# Patient Record
Sex: Male | Born: 1940 | Hispanic: No | Marital: Married | Smoking: Former smoker
Health system: Southern US, Community
[De-identification: ages and names within clinical notes are randomized; demographics above are authoritative.]

## PROBLEM LIST (undated history)

## (undated) DIAGNOSIS — E785 Hyperlipidemia, unspecified: Secondary | ICD-10-CM

## (undated) DIAGNOSIS — K5790 Diverticulosis of intestine, part unspecified, without perforation or abscess without bleeding: Secondary | ICD-10-CM

## (undated) DIAGNOSIS — I2510 Atherosclerotic heart disease of native coronary artery without angina pectoris: Secondary | ICD-10-CM

## (undated) DIAGNOSIS — R569 Unspecified convulsions: Secondary | ICD-10-CM

## (undated) DIAGNOSIS — E119 Type 2 diabetes mellitus without complications: Secondary | ICD-10-CM

## (undated) DIAGNOSIS — J189 Pneumonia, unspecified organism: Secondary | ICD-10-CM

## (undated) DIAGNOSIS — I209 Angina pectoris, unspecified: Secondary | ICD-10-CM

## (undated) DIAGNOSIS — I739 Peripheral vascular disease, unspecified: Secondary | ICD-10-CM

## (undated) DIAGNOSIS — K219 Gastro-esophageal reflux disease without esophagitis: Secondary | ICD-10-CM

## (undated) DIAGNOSIS — I10 Essential (primary) hypertension: Secondary | ICD-10-CM

## (undated) DIAGNOSIS — G4730 Sleep apnea, unspecified: Secondary | ICD-10-CM

## (undated) DIAGNOSIS — N400 Enlarged prostate without lower urinary tract symptoms: Secondary | ICD-10-CM

## (undated) DIAGNOSIS — I872 Venous insufficiency (chronic) (peripheral): Secondary | ICD-10-CM

## (undated) DIAGNOSIS — G4700 Insomnia, unspecified: Secondary | ICD-10-CM

## (undated) DIAGNOSIS — Z22322 Carrier or suspected carrier of Methicillin resistant Staphylococcus aureus: Secondary | ICD-10-CM

## (undated) DIAGNOSIS — IMO0002 Reserved for concepts with insufficient information to code with codable children: Secondary | ICD-10-CM

## (undated) DIAGNOSIS — F32A Depression, unspecified: Secondary | ICD-10-CM

## (undated) DIAGNOSIS — I639 Cerebral infarction, unspecified: Secondary | ICD-10-CM

## (undated) DIAGNOSIS — F329 Major depressive disorder, single episode, unspecified: Secondary | ICD-10-CM

## (undated) DIAGNOSIS — R519 Headache, unspecified: Secondary | ICD-10-CM

## (undated) DIAGNOSIS — R51 Headache: Secondary | ICD-10-CM

## (undated) HISTORY — DX: Venous insufficiency (chronic) (peripheral): I87.2

## (undated) HISTORY — PX: APPENDECTOMY: SHX54

## (undated) HISTORY — DX: Morbid (severe) obesity due to excess calories: E66.01

## (undated) HISTORY — PX: EYE SURGERY: SHX253

## (undated) HISTORY — DX: Benign prostatic hyperplasia without lower urinary tract symptoms: N40.0

## (undated) HISTORY — DX: Diverticulosis of intestine, part unspecified, without perforation or abscess without bleeding: K57.90

## (undated) HISTORY — PX: PERIPHERAL ARTERIAL STENT GRAFT: SHX2220

## (undated) HISTORY — DX: Depression, unspecified: F32.A

## (undated) HISTORY — PX: CHOLECYSTECTOMY: SHX55

## (undated) HISTORY — DX: Insomnia, unspecified: G47.00

## (undated) HISTORY — DX: Carrier or suspected carrier of methicillin resistant Staphylococcus aureus: Z22.322

## (undated) HISTORY — PX: CORONARY ARTERY BYPASS GRAFT: SHX141

## (undated) HISTORY — DX: Sleep apnea, unspecified: G47.30

## (undated) HISTORY — PX: LUMBAR LAMINECTOMY: SHX95

## (undated) HISTORY — PX: BACK SURGERY: SHX140

## (undated) HISTORY — PX: MASTOID DEBRIDEMENT: SHX2002

## (undated) HISTORY — DX: Peripheral vascular disease, unspecified: I73.9

## (undated) HISTORY — DX: Hyperlipidemia, unspecified: E78.5

## (undated) HISTORY — DX: Reserved for concepts with insufficient information to code with codable children: IMO0002

## (undated) HISTORY — DX: Major depressive disorder, single episode, unspecified: F32.9

---

## 1977-08-05 HISTORY — PX: OTHER SURGICAL HISTORY: SHX169

## 1998-03-23 ENCOUNTER — Encounter: Admission: RE | Admit: 1998-03-23 | Discharge: 1998-06-21 | Payer: Self-pay | Admitting: Internal Medicine

## 1998-04-25 ENCOUNTER — Encounter: Admission: RE | Admit: 1998-04-25 | Discharge: 1998-07-24 | Payer: Self-pay | Admitting: Internal Medicine

## 1998-10-16 ENCOUNTER — Encounter: Admission: RE | Admit: 1998-10-16 | Discharge: 1998-10-19 | Payer: Self-pay | Admitting: Orthopedic Surgery

## 1999-01-16 ENCOUNTER — Encounter: Admission: RE | Admit: 1999-01-16 | Discharge: 1999-01-17 | Payer: Self-pay | Admitting: Internal Medicine

## 1999-02-01 ENCOUNTER — Ambulatory Visit (HOSPITAL_COMMUNITY): Admission: RE | Admit: 1999-02-01 | Discharge: 1999-02-01 | Payer: Self-pay | Admitting: Gastroenterology

## 1999-02-16 ENCOUNTER — Encounter: Payer: Self-pay | Admitting: Urology

## 1999-02-19 ENCOUNTER — Ambulatory Visit (HOSPITAL_COMMUNITY): Admission: RE | Admit: 1999-02-19 | Discharge: 1999-02-19 | Payer: Self-pay | Admitting: Urology

## 1999-02-19 ENCOUNTER — Encounter (INDEPENDENT_AMBULATORY_CARE_PROVIDER_SITE_OTHER): Payer: Self-pay | Admitting: Specialist

## 1999-04-16 ENCOUNTER — Encounter: Admission: RE | Admit: 1999-04-16 | Discharge: 1999-07-15 | Payer: Self-pay | Admitting: Orthopedic Surgery

## 1999-07-05 ENCOUNTER — Other Ambulatory Visit: Admission: RE | Admit: 1999-07-05 | Discharge: 1999-07-05 | Payer: Self-pay | Admitting: Orthopaedic Surgery

## 1999-08-27 ENCOUNTER — Encounter (INDEPENDENT_AMBULATORY_CARE_PROVIDER_SITE_OTHER): Payer: Self-pay | Admitting: *Deleted

## 1999-08-27 ENCOUNTER — Encounter: Payer: Self-pay | Admitting: Emergency Medicine

## 1999-08-27 ENCOUNTER — Encounter: Payer: Self-pay | Admitting: Internal Medicine

## 1999-08-27 ENCOUNTER — Inpatient Hospital Stay (HOSPITAL_COMMUNITY): Admission: EM | Admit: 1999-08-27 | Discharge: 1999-09-13 | Payer: Self-pay | Admitting: Emergency Medicine

## 1999-08-29 ENCOUNTER — Encounter: Payer: Self-pay | Admitting: Internal Medicine

## 1999-08-30 ENCOUNTER — Encounter: Payer: Self-pay | Admitting: Internal Medicine

## 1999-08-31 ENCOUNTER — Encounter: Payer: Self-pay | Admitting: Internal Medicine

## 1999-09-01 ENCOUNTER — Encounter: Payer: Self-pay | Admitting: Internal Medicine

## 1999-09-02 ENCOUNTER — Encounter: Payer: Self-pay | Admitting: Cardiothoracic Surgery

## 1999-09-02 ENCOUNTER — Encounter: Payer: Self-pay | Admitting: Internal Medicine

## 1999-09-03 ENCOUNTER — Encounter: Payer: Self-pay | Admitting: Internal Medicine

## 1999-09-03 ENCOUNTER — Encounter: Payer: Self-pay | Admitting: Cardiothoracic Surgery

## 1999-09-04 ENCOUNTER — Encounter: Payer: Self-pay | Admitting: Internal Medicine

## 1999-09-05 ENCOUNTER — Encounter: Payer: Self-pay | Admitting: Internal Medicine

## 1999-09-06 ENCOUNTER — Encounter: Payer: Self-pay | Admitting: Internal Medicine

## 1999-09-07 ENCOUNTER — Encounter: Payer: Self-pay | Admitting: Internal Medicine

## 1999-09-09 ENCOUNTER — Encounter: Payer: Self-pay | Admitting: Internal Medicine

## 1999-09-10 ENCOUNTER — Encounter: Payer: Self-pay | Admitting: Cardiothoracic Surgery

## 1999-09-11 ENCOUNTER — Encounter: Payer: Self-pay | Admitting: Internal Medicine

## 1999-09-20 ENCOUNTER — Encounter: Admission: RE | Admit: 1999-09-20 | Discharge: 1999-09-20 | Payer: Self-pay | Admitting: Cardiothoracic Surgery

## 1999-09-20 ENCOUNTER — Encounter: Payer: Self-pay | Admitting: Cardiothoracic Surgery

## 1999-10-01 ENCOUNTER — Encounter: Admission: RE | Admit: 1999-10-01 | Discharge: 1999-12-30 | Payer: Self-pay | Admitting: Orthopedic Surgery

## 1999-12-25 ENCOUNTER — Ambulatory Visit (HOSPITAL_COMMUNITY): Admission: RE | Admit: 1999-12-25 | Discharge: 1999-12-25 | Payer: Self-pay | Admitting: Internal Medicine

## 1999-12-25 ENCOUNTER — Encounter: Payer: Self-pay | Admitting: Internal Medicine

## 1999-12-31 ENCOUNTER — Encounter: Admission: RE | Admit: 1999-12-31 | Discharge: 2000-03-30 | Payer: Self-pay | Admitting: Internal Medicine

## 2000-04-09 ENCOUNTER — Encounter: Admission: RE | Admit: 2000-04-09 | Discharge: 2000-07-08 | Payer: Self-pay | Admitting: Internal Medicine

## 2000-07-15 ENCOUNTER — Encounter: Admission: RE | Admit: 2000-07-15 | Discharge: 2000-10-13 | Payer: Self-pay | Admitting: Orthopedic Surgery

## 2000-10-23 ENCOUNTER — Encounter: Admission: RE | Admit: 2000-10-23 | Discharge: 2001-01-21 | Payer: Self-pay | Admitting: Internal Medicine

## 2001-01-14 ENCOUNTER — Encounter: Admission: RE | Admit: 2001-01-14 | Discharge: 2001-01-19 | Payer: Self-pay | Admitting: Internal Medicine

## 2001-02-07 ENCOUNTER — Ambulatory Visit (HOSPITAL_COMMUNITY): Admission: RE | Admit: 2001-02-07 | Discharge: 2001-02-07 | Payer: Self-pay

## 2001-04-09 ENCOUNTER — Encounter: Admission: RE | Admit: 2001-04-09 | Discharge: 2001-07-08 | Payer: Self-pay

## 2001-06-18 ENCOUNTER — Ambulatory Visit (HOSPITAL_COMMUNITY): Admission: RE | Admit: 2001-06-18 | Discharge: 2001-06-18 | Payer: Self-pay | Admitting: Ophthalmology

## 2001-09-22 ENCOUNTER — Encounter: Admission: RE | Admit: 2001-09-22 | Discharge: 2001-12-21 | Payer: Self-pay

## 2002-03-10 ENCOUNTER — Encounter: Payer: Self-pay | Admitting: Internal Medicine

## 2002-03-10 ENCOUNTER — Encounter: Admission: RE | Admit: 2002-03-10 | Discharge: 2002-03-10 | Payer: Self-pay | Admitting: Internal Medicine

## 2002-03-18 ENCOUNTER — Inpatient Hospital Stay (HOSPITAL_COMMUNITY): Admission: AD | Admit: 2002-03-18 | Discharge: 2002-04-02 | Payer: Self-pay | Admitting: Cardiology

## 2002-03-18 ENCOUNTER — Encounter (INDEPENDENT_AMBULATORY_CARE_PROVIDER_SITE_OTHER): Payer: Self-pay | Admitting: *Deleted

## 2002-03-19 ENCOUNTER — Encounter (INDEPENDENT_AMBULATORY_CARE_PROVIDER_SITE_OTHER): Payer: Self-pay | Admitting: Cardiology

## 2002-03-19 ENCOUNTER — Encounter: Payer: Self-pay | Admitting: Cardiology

## 2002-03-20 ENCOUNTER — Encounter: Payer: Self-pay | Admitting: Cardiology

## 2002-03-24 ENCOUNTER — Encounter: Payer: Self-pay | Admitting: Cardiothoracic Surgery

## 2002-03-25 ENCOUNTER — Encounter: Payer: Self-pay | Admitting: Cardiothoracic Surgery

## 2002-03-26 ENCOUNTER — Encounter: Payer: Self-pay | Admitting: Cardiothoracic Surgery

## 2002-03-27 ENCOUNTER — Encounter: Payer: Self-pay | Admitting: Cardiothoracic Surgery

## 2002-03-28 ENCOUNTER — Encounter: Payer: Self-pay | Admitting: Thoracic Surgery (Cardiothoracic Vascular Surgery)

## 2002-03-30 ENCOUNTER — Encounter: Payer: Self-pay | Admitting: Cardiothoracic Surgery

## 2002-04-02 ENCOUNTER — Encounter: Payer: Self-pay | Admitting: Emergency Medicine

## 2002-04-02 ENCOUNTER — Inpatient Hospital Stay (HOSPITAL_COMMUNITY): Admission: EM | Admit: 2002-04-02 | Discharge: 2002-04-09 | Payer: Self-pay | Admitting: Emergency Medicine

## 2002-04-04 ENCOUNTER — Encounter: Payer: Self-pay | Admitting: Neurology

## 2002-04-06 ENCOUNTER — Encounter: Payer: Self-pay | Admitting: Neurology

## 2002-04-08 ENCOUNTER — Encounter: Payer: Self-pay | Admitting: Neurology

## 2002-04-20 ENCOUNTER — Encounter: Payer: Self-pay | Admitting: Neurosurgery

## 2002-04-20 ENCOUNTER — Ambulatory Visit (HOSPITAL_COMMUNITY): Admission: RE | Admit: 2002-04-20 | Discharge: 2002-04-20 | Payer: Self-pay | Admitting: Neurosurgery

## 2002-05-13 ENCOUNTER — Encounter: Admission: RE | Admit: 2002-05-13 | Discharge: 2002-08-11 | Payer: Self-pay | Admitting: Neurology

## 2002-05-25 ENCOUNTER — Ambulatory Visit (HOSPITAL_COMMUNITY): Admission: RE | Admit: 2002-05-25 | Discharge: 2002-05-25 | Payer: Self-pay | Admitting: Neurosurgery

## 2002-05-25 ENCOUNTER — Encounter: Payer: Self-pay | Admitting: Neurosurgery

## 2002-05-26 ENCOUNTER — Encounter: Payer: Self-pay | Admitting: Cardiology

## 2002-05-26 ENCOUNTER — Ambulatory Visit (HOSPITAL_COMMUNITY): Admission: RE | Admit: 2002-05-26 | Discharge: 2002-05-26 | Payer: Self-pay | Admitting: Cardiology

## 2002-06-10 ENCOUNTER — Ambulatory Visit (HOSPITAL_COMMUNITY): Admission: RE | Admit: 2002-06-10 | Discharge: 2002-06-10 | Payer: Self-pay | Admitting: Interventional Radiology

## 2002-07-19 ENCOUNTER — Encounter (HOSPITAL_COMMUNITY): Admission: RE | Admit: 2002-07-19 | Discharge: 2002-10-17 | Payer: Self-pay | Admitting: Cardiology

## 2002-08-13 ENCOUNTER — Encounter: Admission: RE | Admit: 2002-08-13 | Discharge: 2002-08-13 | Payer: Self-pay | Admitting: Cardiothoracic Surgery

## 2002-08-13 ENCOUNTER — Encounter: Payer: Self-pay | Admitting: Cardiothoracic Surgery

## 2002-09-14 ENCOUNTER — Encounter: Payer: Self-pay | Admitting: Internal Medicine

## 2002-09-14 ENCOUNTER — Encounter: Admission: RE | Admit: 2002-09-14 | Discharge: 2002-09-14 | Payer: Self-pay | Admitting: Internal Medicine

## 2002-10-03 ENCOUNTER — Inpatient Hospital Stay (HOSPITAL_COMMUNITY): Admission: EM | Admit: 2002-10-03 | Discharge: 2002-10-05 | Payer: Self-pay | Admitting: Emergency Medicine

## 2002-11-10 ENCOUNTER — Emergency Department (HOSPITAL_COMMUNITY): Admission: EM | Admit: 2002-11-10 | Discharge: 2002-11-10 | Payer: Self-pay | Admitting: Emergency Medicine

## 2002-12-23 ENCOUNTER — Ambulatory Visit (HOSPITAL_COMMUNITY): Admission: AD | Admit: 2002-12-23 | Discharge: 2002-12-23 | Payer: Self-pay | Admitting: Interventional Radiology

## 2003-02-16 ENCOUNTER — Ambulatory Visit (HOSPITAL_COMMUNITY): Admission: RE | Admit: 2003-02-16 | Discharge: 2003-02-16 | Payer: Self-pay | Admitting: Gastroenterology

## 2003-02-16 ENCOUNTER — Encounter (INDEPENDENT_AMBULATORY_CARE_PROVIDER_SITE_OTHER): Payer: Self-pay | Admitting: *Deleted

## 2003-04-15 ENCOUNTER — Inpatient Hospital Stay (HOSPITAL_COMMUNITY): Admission: EM | Admit: 2003-04-15 | Discharge: 2003-04-17 | Payer: Self-pay | Admitting: Emergency Medicine

## 2003-04-15 ENCOUNTER — Encounter: Payer: Self-pay | Admitting: Emergency Medicine

## 2003-07-06 ENCOUNTER — Ambulatory Visit (HOSPITAL_COMMUNITY): Admission: RE | Admit: 2003-07-06 | Discharge: 2003-07-06 | Payer: Self-pay | Admitting: Interventional Radiology

## 2004-05-10 ENCOUNTER — Ambulatory Visit: Payer: Self-pay | Admitting: Internal Medicine

## 2004-05-11 ENCOUNTER — Inpatient Hospital Stay (HOSPITAL_COMMUNITY): Admission: EM | Admit: 2004-05-11 | Discharge: 2004-05-20 | Payer: Self-pay | Admitting: *Deleted

## 2004-05-17 ENCOUNTER — Ambulatory Visit: Payer: Self-pay | Admitting: Plastic Surgery

## 2004-05-24 ENCOUNTER — Inpatient Hospital Stay (HOSPITAL_COMMUNITY): Admission: RE | Admit: 2004-05-24 | Discharge: 2004-06-25 | Payer: Self-pay | Admitting: Internal Medicine

## 2004-05-24 ENCOUNTER — Ambulatory Visit: Payer: Self-pay | Admitting: Infectious Diseases

## 2004-05-30 ENCOUNTER — Ambulatory Visit: Payer: Self-pay | Admitting: Plastic Surgery

## 2004-06-03 ENCOUNTER — Ambulatory Visit: Payer: Self-pay | Admitting: Physical Medicine & Rehabilitation

## 2004-06-07 ENCOUNTER — Ambulatory Visit: Payer: Self-pay | Admitting: Infectious Diseases

## 2004-07-20 ENCOUNTER — Emergency Department (HOSPITAL_COMMUNITY): Admission: EM | Admit: 2004-07-20 | Discharge: 2004-07-21 | Payer: Self-pay

## 2004-07-25 ENCOUNTER — Emergency Department (HOSPITAL_COMMUNITY): Admission: EM | Admit: 2004-07-25 | Discharge: 2004-07-25 | Payer: Self-pay | Admitting: Emergency Medicine

## 2004-07-29 ENCOUNTER — Emergency Department (HOSPITAL_COMMUNITY): Admission: EM | Admit: 2004-07-29 | Discharge: 2004-07-29 | Payer: Self-pay | Admitting: Emergency Medicine

## 2004-08-13 ENCOUNTER — Ambulatory Visit: Payer: Self-pay | Admitting: Plastic Surgery

## 2004-08-13 ENCOUNTER — Inpatient Hospital Stay (HOSPITAL_COMMUNITY): Admission: EM | Admit: 2004-08-13 | Discharge: 2004-08-22 | Payer: Self-pay | Admitting: Emergency Medicine

## 2005-04-01 ENCOUNTER — Encounter (HOSPITAL_BASED_OUTPATIENT_CLINIC_OR_DEPARTMENT_OTHER): Admission: RE | Admit: 2005-04-01 | Discharge: 2005-05-22 | Payer: Self-pay | Admitting: Surgery

## 2005-07-22 ENCOUNTER — Encounter (HOSPITAL_BASED_OUTPATIENT_CLINIC_OR_DEPARTMENT_OTHER): Admission: RE | Admit: 2005-07-22 | Discharge: 2005-10-20 | Payer: Self-pay | Admitting: Surgery

## 2005-11-06 ENCOUNTER — Encounter (HOSPITAL_BASED_OUTPATIENT_CLINIC_OR_DEPARTMENT_OTHER): Admission: RE | Admit: 2005-11-06 | Discharge: 2006-02-04 | Payer: Self-pay | Admitting: Internal Medicine

## 2005-11-20 ENCOUNTER — Encounter: Admission: RE | Admit: 2005-11-20 | Discharge: 2005-11-20 | Payer: Self-pay | Admitting: Internal Medicine

## 2005-12-04 ENCOUNTER — Encounter: Payer: Self-pay | Admitting: Neurology

## 2006-01-01 ENCOUNTER — Ambulatory Visit (HOSPITAL_COMMUNITY): Admission: RE | Admit: 2006-01-01 | Discharge: 2006-01-01 | Payer: Self-pay | Admitting: Surgery

## 2006-02-06 ENCOUNTER — Encounter (HOSPITAL_BASED_OUTPATIENT_CLINIC_OR_DEPARTMENT_OTHER): Admission: RE | Admit: 2006-02-06 | Discharge: 2006-05-07 | Payer: Self-pay | Admitting: Surgery

## 2006-02-19 ENCOUNTER — Inpatient Hospital Stay (HOSPITAL_COMMUNITY): Admission: EM | Admit: 2006-02-19 | Discharge: 2006-02-22 | Payer: Self-pay | Admitting: Emergency Medicine

## 2006-04-23 ENCOUNTER — Inpatient Hospital Stay (HOSPITAL_COMMUNITY): Admission: EM | Admit: 2006-04-23 | Discharge: 2006-04-27 | Payer: Self-pay | Admitting: Emergency Medicine

## 2006-05-07 ENCOUNTER — Encounter (HOSPITAL_BASED_OUTPATIENT_CLINIC_OR_DEPARTMENT_OTHER): Admission: RE | Admit: 2006-05-07 | Discharge: 2006-07-24 | Payer: Self-pay | Admitting: Surgery

## 2006-08-01 ENCOUNTER — Encounter (HOSPITAL_BASED_OUTPATIENT_CLINIC_OR_DEPARTMENT_OTHER): Admission: RE | Admit: 2006-08-01 | Discharge: 2006-08-19 | Payer: Self-pay | Admitting: Surgery

## 2006-08-19 ENCOUNTER — Encounter (HOSPITAL_BASED_OUTPATIENT_CLINIC_OR_DEPARTMENT_OTHER): Admission: RE | Admit: 2006-08-19 | Discharge: 2006-11-17 | Payer: Self-pay | Admitting: Surgery

## 2006-08-20 ENCOUNTER — Ambulatory Visit (HOSPITAL_COMMUNITY): Admission: RE | Admit: 2006-08-20 | Discharge: 2006-08-20 | Payer: Self-pay | Admitting: Surgery

## 2006-11-19 ENCOUNTER — Encounter (HOSPITAL_BASED_OUTPATIENT_CLINIC_OR_DEPARTMENT_OTHER): Admission: RE | Admit: 2006-11-19 | Discharge: 2006-12-09 | Payer: Self-pay | Admitting: Surgery

## 2006-12-10 ENCOUNTER — Encounter (HOSPITAL_BASED_OUTPATIENT_CLINIC_OR_DEPARTMENT_OTHER): Admission: RE | Admit: 2006-12-10 | Discharge: 2007-03-10 | Payer: Self-pay | Admitting: Surgery

## 2007-04-23 ENCOUNTER — Encounter (HOSPITAL_BASED_OUTPATIENT_CLINIC_OR_DEPARTMENT_OTHER): Admission: RE | Admit: 2007-04-23 | Discharge: 2007-05-05 | Payer: Self-pay | Admitting: Surgery

## 2007-06-22 ENCOUNTER — Inpatient Hospital Stay (HOSPITAL_COMMUNITY): Admission: EM | Admit: 2007-06-22 | Discharge: 2007-06-25 | Payer: Self-pay | Admitting: Emergency Medicine

## 2008-02-08 ENCOUNTER — Inpatient Hospital Stay (HOSPITAL_COMMUNITY): Admission: EM | Admit: 2008-02-08 | Discharge: 2008-02-11 | Payer: Self-pay | Admitting: Emergency Medicine

## 2008-07-14 ENCOUNTER — Encounter (HOSPITAL_BASED_OUTPATIENT_CLINIC_OR_DEPARTMENT_OTHER): Admission: RE | Admit: 2008-07-14 | Discharge: 2008-08-04 | Payer: Self-pay | Admitting: Internal Medicine

## 2008-08-08 ENCOUNTER — Encounter (HOSPITAL_BASED_OUTPATIENT_CLINIC_OR_DEPARTMENT_OTHER): Admission: RE | Admit: 2008-08-08 | Discharge: 2008-11-06 | Payer: Self-pay | Admitting: Internal Medicine

## 2008-11-03 ENCOUNTER — Encounter (HOSPITAL_BASED_OUTPATIENT_CLINIC_OR_DEPARTMENT_OTHER): Admission: RE | Admit: 2008-11-03 | Discharge: 2009-02-01 | Payer: Self-pay | Admitting: General Surgery

## 2008-12-06 ENCOUNTER — Ambulatory Visit: Admission: RE | Admit: 2008-12-06 | Discharge: 2008-12-06 | Payer: Self-pay | Admitting: Internal Medicine

## 2008-12-06 ENCOUNTER — Encounter (HOSPITAL_BASED_OUTPATIENT_CLINIC_OR_DEPARTMENT_OTHER): Payer: Self-pay | Admitting: Internal Medicine

## 2008-12-06 ENCOUNTER — Ambulatory Visit: Payer: Self-pay | Admitting: Vascular Surgery

## 2008-12-26 ENCOUNTER — Inpatient Hospital Stay (HOSPITAL_COMMUNITY): Admission: EM | Admit: 2008-12-26 | Discharge: 2008-12-29 | Payer: Self-pay | Admitting: Emergency Medicine

## 2009-02-02 ENCOUNTER — Encounter (HOSPITAL_BASED_OUTPATIENT_CLINIC_OR_DEPARTMENT_OTHER): Admission: RE | Admit: 2009-02-02 | Discharge: 2009-05-03 | Payer: Self-pay | Admitting: Internal Medicine

## 2009-05-16 ENCOUNTER — Encounter (HOSPITAL_BASED_OUTPATIENT_CLINIC_OR_DEPARTMENT_OTHER): Admission: RE | Admit: 2009-05-16 | Discharge: 2009-06-12 | Payer: Self-pay | Admitting: Internal Medicine

## 2009-06-19 ENCOUNTER — Encounter (HOSPITAL_BASED_OUTPATIENT_CLINIC_OR_DEPARTMENT_OTHER): Admission: RE | Admit: 2009-06-19 | Discharge: 2009-07-27 | Payer: Self-pay | Admitting: Internal Medicine

## 2009-08-01 ENCOUNTER — Encounter (HOSPITAL_BASED_OUTPATIENT_CLINIC_OR_DEPARTMENT_OTHER)
Admission: RE | Admit: 2009-08-01 | Discharge: 2009-10-10 | Payer: Self-pay | Source: Home / Self Care | Admitting: Internal Medicine

## 2009-08-10 ENCOUNTER — Encounter: Payer: Self-pay | Admitting: Infectious Diseases

## 2009-08-24 ENCOUNTER — Ambulatory Visit: Payer: Self-pay | Admitting: Infectious Diseases

## 2009-08-24 DIAGNOSIS — B820 Intestinal helminthiasis, unspecified: Secondary | ICD-10-CM

## 2009-12-11 ENCOUNTER — Encounter (HOSPITAL_BASED_OUTPATIENT_CLINIC_OR_DEPARTMENT_OTHER): Admission: RE | Admit: 2009-12-11 | Discharge: 2010-03-11 | Payer: Self-pay | Admitting: Internal Medicine

## 2009-12-22 ENCOUNTER — Ambulatory Visit: Payer: Self-pay | Admitting: Internal Medicine

## 2009-12-22 ENCOUNTER — Inpatient Hospital Stay (HOSPITAL_COMMUNITY): Admission: AD | Admit: 2009-12-22 | Discharge: 2009-12-27 | Payer: Self-pay | Admitting: Internal Medicine

## 2010-02-13 ENCOUNTER — Ambulatory Visit (HOSPITAL_COMMUNITY): Admission: RE | Admit: 2010-02-13 | Discharge: 2010-02-13 | Payer: Self-pay | Admitting: Internal Medicine

## 2010-03-13 ENCOUNTER — Encounter (HOSPITAL_BASED_OUTPATIENT_CLINIC_OR_DEPARTMENT_OTHER)
Admission: RE | Admit: 2010-03-13 | Discharge: 2010-04-27 | Payer: Self-pay | Source: Home / Self Care | Admitting: Internal Medicine

## 2010-04-20 ENCOUNTER — Inpatient Hospital Stay (HOSPITAL_COMMUNITY): Admission: EM | Admit: 2010-04-20 | Discharge: 2010-04-26 | Payer: Self-pay | Admitting: Emergency Medicine

## 2010-05-21 ENCOUNTER — Encounter (HOSPITAL_BASED_OUTPATIENT_CLINIC_OR_DEPARTMENT_OTHER)
Admission: RE | Admit: 2010-05-21 | Discharge: 2010-08-19 | Payer: Self-pay | Source: Home / Self Care | Attending: Internal Medicine | Admitting: Internal Medicine

## 2010-07-28 ENCOUNTER — Inpatient Hospital Stay (HOSPITAL_COMMUNITY)
Admission: EM | Admit: 2010-07-28 | Discharge: 2010-08-02 | Payer: Self-pay | Source: Home / Self Care | Attending: Internal Medicine | Admitting: Internal Medicine

## 2010-07-31 ENCOUNTER — Encounter (INDEPENDENT_AMBULATORY_CARE_PROVIDER_SITE_OTHER): Payer: Self-pay | Admitting: Internal Medicine

## 2010-08-20 ENCOUNTER — Encounter (HOSPITAL_BASED_OUTPATIENT_CLINIC_OR_DEPARTMENT_OTHER)
Admission: RE | Admit: 2010-08-20 | Discharge: 2010-09-04 | Payer: Self-pay | Source: Home / Self Care | Attending: Internal Medicine | Admitting: Internal Medicine

## 2010-08-24 ENCOUNTER — Inpatient Hospital Stay (HOSPITAL_COMMUNITY)
Admission: EM | Admit: 2010-08-24 | Discharge: 2010-08-29 | Payer: Self-pay | Source: Home / Self Care | Attending: Internal Medicine | Admitting: Internal Medicine

## 2010-08-27 LAB — DIFFERENTIAL
Basophils Absolute: 0 10*3/uL (ref 0.0–0.1)
Basophils Relative: 0 % (ref 0–1)
Eosinophils Absolute: 0.1 10*3/uL (ref 0.0–0.7)
Eosinophils Relative: 0 % (ref 0–5)
Monocytes Absolute: 0.9 10*3/uL (ref 0.1–1.0)

## 2010-08-27 LAB — BASIC METABOLIC PANEL
BUN: 17 mg/dL (ref 6–23)
Calcium: 8.8 mg/dL (ref 8.4–10.5)
GFR calc non Af Amer: 56 mL/min — ABNORMAL LOW (ref >60)
Glucose, Bld: 96 mg/dL (ref 70–99)
Potassium: 4 mEq/L (ref 3.5–5.1)
Sodium: 136 mEq/L (ref 135–145)

## 2010-08-27 LAB — CBC
MCHC: 30.9 g/dL (ref 30.0–36.0)
Platelets: 182 10*3/uL (ref 150–400)
RDW: 16.5 % — ABNORMAL HIGH (ref 11.5–15.5)
WBC: 14 10*3/uL — ABNORMAL HIGH (ref 4.0–10.5)

## 2010-08-27 LAB — URINALYSIS, ROUTINE W REFLEX MICROSCOPIC
Bilirubin Urine: NEGATIVE
Hgb urine dipstick: NEGATIVE
Nitrite: NEGATIVE
Protein, ur: NEGATIVE mg/dL
Specific Gravity, Urine: 1.018 (ref 1.005–1.030)
Urobilinogen, UA: 0.2 mg/dL (ref 0.0–1.0)

## 2010-08-27 LAB — POCT CARDIAC MARKERS
CKMB, poc: <1 ng/mL — ABNORMAL LOW (ref 1.0–8.0)
Myoglobin, poc: 96.9 ng/mL (ref 12–200)
Troponin i, poc: <0.05 ng/mL (ref 0.00–0.09)

## 2010-08-27 LAB — BRAIN NATRIURETIC PEPTIDE: Pro B Natriuretic peptide (BNP): 142 pg/mL — ABNORMAL HIGH (ref 0.0–100.0)

## 2010-08-27 LAB — GLUCOSE, CAPILLARY

## 2010-08-28 LAB — GLUCOSE, CAPILLARY
Glucose-Capillary: 116 mg/dL — ABNORMAL HIGH (ref 70–99)
Glucose-Capillary: 214 mg/dL — ABNORMAL HIGH (ref 70–99)
Glucose-Capillary: 126 mg/dL — ABNORMAL HIGH (ref 70–99)
Glucose-Capillary: 170 mg/dL — ABNORMAL HIGH (ref 70–99)
Glucose-Capillary: 157 mg/dL — ABNORMAL HIGH (ref 70–99)
Glucose-Capillary: 183 mg/dL — ABNORMAL HIGH (ref 70–99)
Glucose-Capillary: 93 mg/dL (ref 70–99)

## 2010-08-28 LAB — COMPREHENSIVE METABOLIC PANEL
ALT: <8 U/L (ref 0–53)
AST: 13 U/L (ref 0–37)
Albumin: 2.7 g/dL — ABNORMAL LOW (ref 3.5–5.2)
Alkaline Phosphatase: 66 U/L (ref 39–117)
Calcium: 8.6 mg/dL (ref 8.4–10.5)
GFR calc Af Amer: >60 mL/min (ref >60)
Potassium: 3.9 mEq/L (ref 3.5–5.1)
Sodium: 141 mEq/L (ref 135–145)
Total Protein: 5.9 g/dL — ABNORMAL LOW (ref 6.0–8.3)

## 2010-08-29 LAB — GLUCOSE, CAPILLARY
Glucose-Capillary: 162 mg/dL — ABNORMAL HIGH (ref 70–99)
Glucose-Capillary: 152 mg/dL — ABNORMAL HIGH (ref 70–99)

## 2010-08-30 LAB — CULTURE, BLOOD (ROUTINE X 2)
Culture  Setup Time: 201201201937
Culture  Setup Time: 201201201937
Culture: NO GROWTH

## 2010-09-03 ENCOUNTER — Emergency Department (HOSPITAL_COMMUNITY)
Admission: EM | Admit: 2010-09-03 | Discharge: 2010-09-03 | Payer: Self-pay | Source: Home / Self Care | Admitting: Emergency Medicine

## 2010-09-06 NOTE — Assessment & Plan Note (Signed)
Summary: new pt pinworms   CC:  new patient / pinworms.  History of Present Illness: 70 yo with mmp who was referred by Dr Ewing Schlein for pinworms.  Per pt he has had epsodes of woms even spitting them out.  Small grains of rice.  He would also get them out of his ear.  This has been occurring for several weeks now.  He places them on a napkin and they woudl squirm around and then stop. Also has abd pain with this and some nausea and vomiting.  No diarrhea - stool is normal to constipated. No wt loss, fevers chills, wt loss.  No perirectal itching.   Never has been abroad and no Financial planner.    Dr Ewing Schlein Dr Johnella Moloney.  Preventive Screening-Counseling & Management  Alcohol-Tobacco     Alcohol drinks/day: 0     Smoking Status: never  Caffeine-Diet-Exercise     Caffeine use/day: 0     Does Patient Exercise: no     Type of exercise: hand/arm strength by using walker  Safety-Violence-Falls     Seat Belt Use: yes   Updated Prior Medication List: meds reviewed and scanned in  Current Allergies (reviewed today): No known allergies  Past History:  Past Medical History: dm djd foot ulcer sacral deu gout schizlprhen chf sleet apnea  Family History: [place]  Social History: lives with wfie. son involved in care  Review of Systems       11 systems reviewed and negative except per HPI   Vital Signs:  Patient profile:   70 year old male Height:      68 inches (172.72 cm) Weight:      287.1 pounds (130.50 kg) BMI:     43.81 Temp:     96.1 degrees F (35.61 degrees C) oral Pulse rate:   57 / minute BP sitting:   147 / 64  (right arm)  Vitals Entered By: Baxter Hire) (August 24, 2009 8:57 AM) CC: new patient / pinworms Is Patient Diabetic? Yes Did you bring your meter with you today? Yes Pain Assessment Patient in pain? yes     Location: right knee Intensity: 6 Type: throbbing Onset of pain  Constant Nutritional Status BMI of > 30 =  obese Nutritional Status Detail appetite is okay per patient  Does patient need assistance? Functional Status Self care Ambulation Wheelchair Comments patient uses a walker at home   Physical Exam  General:  overwt man in wc Head:  normocephalic.   Eyes:  injected sclera Ears:  + cerumen Mouth:  poor dentition.  no lesions Neck:  supple.   Lungs:  normal respiratory effort.   Heart:  normal rate and regular rhythm.   Abdomen:  soft and non-tender.   Rectal:  no lesion or worms noted. Healing sacral decub Msk:  normal ROM and no joint tenderness.   Extremities:  1+ edema bil Neurologic:  unusual affect with pressured speech non focal Additional Exam:  o and p 10 19 10  neg wbc 7.6 eos 4.3 % wnl plt 236 hgb 16 lfts wnl tsh wnl   Impression & Recommendations:  Problem # 1:  UNSPECIFIED INTESTINAL HELMINTHIASIS (ICD-127.9) I doubt he has an parastitic infection. WIll treat empirically wihtmebendazole. Advised to bring in the "worms" he has observed and we will send to micro lab. Reassured that mebendazoel should cure any parastitic infection. Follow up only if needed.  Medications Added to Medication List This Visit: 1)  Mebendazole 100 Mg Chew (Mebendazole) .Marland KitchenMarland KitchenMarland Kitchen  One by mouth two times a day for 3 days then repeat in 2 weeks.  Patient Instructions: 1)  Take mebendazole 100 mg twice a day for 3 days then repeat in 2 weeks. 2)  If you notice any further worms please bring them in to the office in the sample jar given to you today 3)  Follow up as needed. Prescriptions: MEBENDAZOLE 100 MG CHEW (MEBENDAZOLE) one by mouth two times a day for 3 days then repeat in 2 weeks.  #12 x 0   Entered and Authorized by:   Clydie Braun MD   Signed by:   Clydie Braun MD on 08/24/2009   Method used:   Print then Give to Patient   RxID:   [id number]   Appended Document: new pt pinworms    Clinical Lists Changes  Orders: Added new Service order of Consultation  Level IV [phone]) - Signed

## 2010-09-06 NOTE — Miscellaneous (Signed)
Summary: HIPAA Restrictions  HIPAA Restrictions   Imported By: Florinda Marker 08/24/2009 14:15:31  _____________________________________________________________________  External Attachment:    Type:   Image     Comment:   External Document

## 2010-09-06 NOTE — Procedures (Signed)
Summary: [facility]: New Pt. Referral  [facility]: New Pt. Referral   Imported By: Florinda Marker 09/07/2009 14:10:52  _____________________________________________________________________  External Attachment:    Type:   Image     Comment:   External Document

## 2010-09-09 NOTE — H&P (Signed)
NAME:  Nicholas [name], Nicholas [name] NO.:  [id number]  MEDICAL RECORD NO.:  [id number]          PATIENT TYPE:  INP  LOCATION:  1823                         FACILITY:  [facility]  PHYSICIAN:  Marinda Elk, M.D.DATE OF BIRTH:  1940/09/23  DATE OF ADMISSION:  08/24/2010 DATE OF DISCHARGE:                             HISTORY & PHYSICAL   PRIMARY CARE DOCTOR:  Candyce Churn, MD  CHIEF COMPLAINT:  Shortness of breath and fever.  HISTORY OF PRESENT ILLNESS:  This is a 70 year old with past medical history of chronic lower extremity edema with diabetic foot ulcer and also past medical history of cellulitis, diabetes type 2, hypertension, and coronary artery disease status post CABG that comes in for shortness of breath.  The history was provided by the patient and his spouse.  The patient is also on home O2.  He noted that he is feeling more weak until 1 day prior to admission, when his weakness got completely worse and he started getting confused.  He tried to go to the bathroom without his oxygen, he was severely short of breath.  He called EMS and he was brought into the hospital.  Here, he relates his respiratory difficulty has improved.  But as per wife, his temperature was checked and it was 102.  He relates that his leg has been more swollen, red, and tender to touch.  It has been itching more often.  He is currently on treatment with antibiotics, which he does not know the name at this time.  He is on Augmentin, which he has been taking since December, since his discharge.  As per his wife, she relates that every time he is confused, he has these episodes of infection, which he has to be treated for.  He relates no sudden onset of shortness of breath.  He had some chills and fevers.  But no nausea, vomiting, abdominal pain, diarrhea, or burning when urinating or sick contact.  ALLERGIES:  No known drug allergies.  PAST MEDICAL HISTORY: 1. Lower extremity  cellulitis. 2. Diabetic foot ulcer. 3. Chronic lower surgery edema. 4. Diabetes type 2. 5. Hypertension. 6. Coronary artery disease. 7. Gout. 8. Hyperlipidemia. 9. Obstructive sleep apnea. 10.Diastolic heart failure. 11.History of diverticulosis. 12.History of mild renal insufficiency with a baseline creatinine of     1.5.  MEDICATIONS: 1. He is on Augmentin 875 p.o. b.i.d. 2. Allopurinol 300 mg daily. 3. Alprazolam 1 mg 1 tab in the morning, 2 tabs in the evening. 4. Atenolol 50 mg b.i.d. 5. Combigan one drop in the left eye daily. 6. Depakote 250 mg at bedtime. 7. Lasix 160 mg every morning. 8. Glipizide 5 mg b.i.d. 9. Hydrocodone 12.5/750 q.6 h. P.r.n. 10.Imdur 30 mg daily. 11.Klor-Con 20 mEq 3 tabs daily. 12.Lantus 15-20 units before bedtime. 13.Metolazone 5 mg 1 tab Monday and Friday. 14.Omeprazole 20 mg daily. 15.Patanol eyedrops b.i.d. 16.Plavix 75 mg daily. 17.Simvastatin 20 mg daily. 18.Temazepam 30 mg at night. 19.Terazosin 5 mg daily. 20.Timolol 0.5 mg in left eye at bedtime. 21.Xalatan 1 drop in both eyes at bedtime.  SOCIAL HISTORY:  The patient lives in [place] with  his wife, he is a former smoker, but denies drugs.  Occasional alcohol.  FAMILY HISTORY:  Significant for hypertension and cancer.  REVIEW OF SYSTEMS:  Ten-point review of system done, negative as per HPI.  PHYSICAL EXAMINATION:  VITAL SIGNS:  Temperature 99, pulse 72, respiration 18, he was satting 97% on 4 L, and blood pressure 146/72. GENERAL:  He is awake, morbidly obese male, alert and oriented x3.  No acute distress.  No use of accessory muscles, unable to talk in brief sentences. HEENT:  Atraumatic, normocephalic.  No jaundice.  No pallor. NECK:  Thick.  It is tough to appreciate JVD secondary to his body habitus. CARDIOVASCULAR:  He has regular rate and rhythm with positive S1, S2. No murmurs, rubs, or gallops. LUNGS:  He has good air movement, but crackles at  bases. ABDOMEN:  Protruded belly.  Positive bowel sounds, nontender, nondistended, and soft. EXTREMITIES:  He has decreased positive pulses bilaterally.  His legs looks swollen.  He says that is normal for hand, but his redness is a little bit worse as per him and this leg is more tender, warm to touch, and more red.  He is warm to touch foot left side, the right leg is not red, but it is hot and warm.  The left leg is more swollen at the right. It is warm and has a ulcer at the base of the foot left 3-4 cm.  No purulent drainage with granulation tissue. NEUROLOGIC:  He is alert, awake, and oriented x4.  Coherent and fluent language and nonfocal.  LABORATORY DATA:  On admission shows UA was negative.  BNP was 142.  His sodium was 136, potassium 4.0, chloride 101, bicarb 26, glucose of 96, BUN of 17, creatinine 1.2, and calcium of 8.8.  His white count is 14.0 with an ANC of 11.2, hemoglobin of 13, and platelet count 182.  His first set of point-of-care cardiac marker is negative x1.  IMAGING:  His chest x-ray showed low lung volumes with stable pulmonary venous hypertension with no overt heart edema.  ASSESSMENT/PLAN: 1. Left leg cellulitis and ulcer.  We will get wound cultures.  We     will start vancomycin , get blood cultures x2.  We will hold     IV fluids.  Try to get wound cultures, try to place Ace bandages if     the patient can tolerate it.  It is good to know that the patient     has had this recurring history.  It would be good to get a second     opinion from a surgical standpoint.  We will defer this to primary     care team.  At this time, he is not septic.  His vitals are stable.     He is alert and oriented x3.  His bicarb is 26.  We will continue     IV antibiotics and admit him to a regular floor. 2. Diabetic foot ulcer.  We will get wound care to see with daily     dressing changes. 3. Chronic bilateral lower extremity edema.  We will get wound care to     see  and offer ACE bandages once the cellulitis improved.  We will     defer this to primary team. 4. Diabetes type 2.  We will hold his glipizide, continue Lantus at a     higher dose and sliding scale. 5. Hypertension, currently stable.  We will continue his home meds.  6. Chronic diastolic heart failure.  His BNP is 145, before it has     been 130, slightly above needed.  It is hard to say whether he is     volume overloaded.  His chest x-ray does not appear like it, but he     does have crackles in his lung.  We will continue his Lasix at 80     mg b.i.d. and continue his metolazone and get strict Is and Os and     daily weights and we will see how he does and we would check his     BMET in the morning.     Marinda Elk, M.D.     AF/MEDQ  D:  08/24/2010  T:  08/24/2010  Job:  [id number]  Electronically Signed by Lambert Keto M.D. on 09/09/2010 11:46:50 AM

## 2010-09-11 ENCOUNTER — Encounter (HOSPITAL_BASED_OUTPATIENT_CLINIC_OR_DEPARTMENT_OTHER): Payer: Medicare Other | Attending: Internal Medicine

## 2010-09-11 DIAGNOSIS — L89109 Pressure ulcer of unspecified part of back, unspecified stage: Secondary | ICD-10-CM | POA: Insufficient documentation

## 2010-09-11 DIAGNOSIS — B353 Tinea pedis: Secondary | ICD-10-CM | POA: Insufficient documentation

## 2010-09-11 DIAGNOSIS — L97809 Non-pressure chronic ulcer of other part of unspecified lower leg with unspecified severity: Secondary | ICD-10-CM | POA: Insufficient documentation

## 2010-09-11 DIAGNOSIS — L8992 Pressure ulcer of unspecified site, stage 2: Secondary | ICD-10-CM | POA: Insufficient documentation

## 2010-09-11 DIAGNOSIS — E1169 Type 2 diabetes mellitus with other specified complication: Secondary | ICD-10-CM | POA: Insufficient documentation

## 2010-09-19 ENCOUNTER — Emergency Department (HOSPITAL_COMMUNITY): Payer: Medicare Other

## 2010-09-19 ENCOUNTER — Inpatient Hospital Stay (HOSPITAL_COMMUNITY)
Admission: EM | Admit: 2010-09-19 | Discharge: 2010-09-26 | DRG: 602 | Disposition: A | Discharge status: Skilled Nursing Facility | Payer: Medicare Other | Attending: Internal Medicine | Admitting: Internal Medicine

## 2010-09-19 DIAGNOSIS — N400 Enlarged prostate without lower urinary tract symptoms: Secondary | ICD-10-CM | POA: Diagnosis present

## 2010-09-19 DIAGNOSIS — F209 Schizophrenia, unspecified: Secondary | ICD-10-CM | POA: Diagnosis present

## 2010-09-19 DIAGNOSIS — I509 Heart failure, unspecified: Secondary | ICD-10-CM | POA: Diagnosis present

## 2010-09-19 DIAGNOSIS — K5730 Diverticulosis of large intestine without perforation or abscess without bleeding: Secondary | ICD-10-CM | POA: Diagnosis present

## 2010-09-19 DIAGNOSIS — I129 Hypertensive chronic kidney disease with stage 1 through stage 4 chronic kidney disease, or unspecified chronic kidney disease: Secondary | ICD-10-CM | POA: Diagnosis present

## 2010-09-19 DIAGNOSIS — I739 Peripheral vascular disease, unspecified: Secondary | ICD-10-CM | POA: Diagnosis present

## 2010-09-19 DIAGNOSIS — A5211 Tabes dorsalis: Secondary | ICD-10-CM | POA: Diagnosis present

## 2010-09-19 DIAGNOSIS — L02419 Cutaneous abscess of limb, unspecified: Principal | ICD-10-CM | POA: Diagnosis present

## 2010-09-19 DIAGNOSIS — I5030 Unspecified diastolic (congestive) heart failure: Secondary | ICD-10-CM | POA: Diagnosis present

## 2010-09-19 DIAGNOSIS — H409 Unspecified glaucoma: Secondary | ICD-10-CM | POA: Diagnosis present

## 2010-09-19 DIAGNOSIS — L89109 Pressure ulcer of unspecified part of back, unspecified stage: Secondary | ICD-10-CM | POA: Diagnosis present

## 2010-09-19 DIAGNOSIS — L98499 Non-pressure chronic ulcer of skin of other sites with unspecified severity: Secondary | ICD-10-CM | POA: Diagnosis present

## 2010-09-19 DIAGNOSIS — R269 Unspecified abnormalities of gait and mobility: Secondary | ICD-10-CM | POA: Diagnosis present

## 2010-09-19 DIAGNOSIS — F3289 Other specified depressive episodes: Secondary | ICD-10-CM | POA: Diagnosis present

## 2010-09-19 DIAGNOSIS — N189 Chronic kidney disease, unspecified: Secondary | ICD-10-CM | POA: Diagnosis present

## 2010-09-19 DIAGNOSIS — G4733 Obstructive sleep apnea (adult) (pediatric): Secondary | ICD-10-CM | POA: Diagnosis present

## 2010-09-19 DIAGNOSIS — F329 Major depressive disorder, single episode, unspecified: Secondary | ICD-10-CM | POA: Diagnosis present

## 2010-09-19 DIAGNOSIS — E119 Type 2 diabetes mellitus without complications: Secondary | ICD-10-CM | POA: Diagnosis present

## 2010-09-19 DIAGNOSIS — L97809 Non-pressure chronic ulcer of other part of unspecified lower leg with unspecified severity: Secondary | ICD-10-CM | POA: Diagnosis present

## 2010-09-19 DIAGNOSIS — Z8614 Personal history of Methicillin resistant Staphylococcus aureus infection: Secondary | ICD-10-CM

## 2010-09-19 DIAGNOSIS — L8994 Pressure ulcer of unspecified site, stage 4: Secondary | ICD-10-CM | POA: Diagnosis present

## 2010-09-19 DIAGNOSIS — G4700 Insomnia, unspecified: Secondary | ICD-10-CM | POA: Diagnosis present

## 2010-09-19 DIAGNOSIS — K5900 Constipation, unspecified: Secondary | ICD-10-CM | POA: Diagnosis present

## 2010-09-19 DIAGNOSIS — D72829 Elevated white blood cell count, unspecified: Secondary | ICD-10-CM | POA: Diagnosis present

## 2010-09-19 DIAGNOSIS — I498 Other specified cardiac arrhythmias: Secondary | ICD-10-CM | POA: Diagnosis present

## 2010-09-19 DIAGNOSIS — I2510 Atherosclerotic heart disease of native coronary artery without angina pectoris: Secondary | ICD-10-CM | POA: Diagnosis present

## 2010-09-19 DIAGNOSIS — Z951 Presence of aortocoronary bypass graft: Secondary | ICD-10-CM

## 2010-09-19 DIAGNOSIS — E785 Hyperlipidemia, unspecified: Secondary | ICD-10-CM | POA: Diagnosis present

## 2010-09-19 LAB — URINALYSIS, ROUTINE W REFLEX MICROSCOPIC
Nitrite: NEGATIVE
Specific Gravity, Urine: 1.017 (ref 1.005–1.030)
Urine Glucose, Fasting: NEGATIVE mg/dL
pH: 5.5 (ref 5.0–8.0)

## 2010-09-19 LAB — CBC
HCT: 44.8 % (ref 39.0–52.0)
HCT: 43.2 % (ref 39.0–52.0)
Hemoglobin: 14.7 g/dL (ref 13.0–17.0)
Hemoglobin: 13.9 g/dL (ref 13.0–17.0)
MCH: 29.8 pg (ref 26.0–34.0)
MCHC: 32.8 g/dL (ref 30.0–36.0)
MCV: 90.9 fL (ref 78.0–100.0)
MCV: 90.8 fL (ref 78.0–100.0)
Platelets: 163 10*3/uL (ref 150–400)
RBC: 4.93 MIL/uL (ref 4.22–5.81)
RBC: 4.76 MIL/uL (ref 4.22–5.81)
WBC: 21.5 10*3/uL — ABNORMAL HIGH (ref 4.0–10.5)

## 2010-09-19 LAB — GLUCOSE, CAPILLARY
Glucose-Capillary: 204 mg/dL — ABNORMAL HIGH (ref 70–99)
Glucose-Capillary: 69 mg/dL — ABNORMAL LOW (ref 70–99)
Glucose-Capillary: 72 mg/dL (ref 70–99)
Glucose-Capillary: 131 mg/dL — ABNORMAL HIGH (ref 70–99)

## 2010-09-19 LAB — BASIC METABOLIC PANEL
BUN: 32 mg/dL — ABNORMAL HIGH (ref 6–23)
CO2: 30 mEq/L (ref 19–32)
Chloride: 95 mEq/L — ABNORMAL LOW (ref 96–112)
Chloride: 100 mEq/L (ref 96–112)
Creatinine, Ser: 1.48 mg/dL (ref 0.4–1.5)
GFR calc Af Amer: 57 mL/min — ABNORMAL LOW (ref >60)
Potassium: 3.3 mEq/L — ABNORMAL LOW (ref 3.5–5.1)
Potassium: 3.7 mEq/L (ref 3.5–5.1)
Sodium: 138 mEq/L (ref 135–145)

## 2010-09-19 LAB — DIFFERENTIAL
Lymphs Abs: 1.2 10*3/uL (ref 0.7–4.0)
Monocytes Absolute: 1.1 10*3/uL — ABNORMAL HIGH (ref 0.1–1.0)
Monocytes Relative: 6 % (ref 3–12)
Neutro Abs: 14.7 10*3/uL — ABNORMAL HIGH (ref 1.7–7.7)
Neutrophils Relative %: 86 % — ABNORMAL HIGH (ref 43–77)

## 2010-09-19 LAB — POCT CARDIAC MARKERS: Troponin i, poc: <0.05 ng/mL (ref 0.00–0.09)

## 2010-09-19 LAB — HEMOGLOBIN A1C: Mean Plasma Glucose: 140 mg/dL — ABNORMAL HIGH (ref <117)

## 2010-09-20 ENCOUNTER — Inpatient Hospital Stay (HOSPITAL_COMMUNITY): Payer: Medicare Other

## 2010-09-20 DIAGNOSIS — M79609 Pain in unspecified limb: Secondary | ICD-10-CM

## 2010-09-20 DIAGNOSIS — L0291 Cutaneous abscess, unspecified: Secondary | ICD-10-CM

## 2010-09-20 DIAGNOSIS — L0390 Cellulitis, unspecified: Secondary | ICD-10-CM

## 2010-09-20 LAB — CBC
HCT: 39.9 % (ref 39.0–52.0)
MCH: 29.1 pg (ref 26.0–34.0)
MCV: 92.8 fL (ref 78.0–100.0)
Platelets: 134 10*3/uL — ABNORMAL LOW (ref 150–400)
RDW: 17.2 % — ABNORMAL HIGH (ref 11.5–15.5)
WBC: 10.8 10*3/uL — ABNORMAL HIGH (ref 4.0–10.5)

## 2010-09-20 LAB — URINE CULTURE
Colony Count: NO GROWTH
Culture: NO GROWTH

## 2010-09-20 LAB — GLUCOSE, CAPILLARY: Glucose-Capillary: 208 mg/dL — ABNORMAL HIGH (ref 70–99)

## 2010-09-21 DIAGNOSIS — L97809 Non-pressure chronic ulcer of other part of unspecified lower leg with unspecified severity: Secondary | ICD-10-CM

## 2010-09-21 DIAGNOSIS — I739 Peripheral vascular disease, unspecified: Secondary | ICD-10-CM

## 2010-09-21 LAB — GLUCOSE, CAPILLARY

## 2010-09-22 LAB — GLUCOSE, CAPILLARY
Glucose-Capillary: 113 mg/dL — ABNORMAL HIGH (ref 70–99)
Glucose-Capillary: 191 mg/dL — ABNORMAL HIGH (ref 70–99)

## 2010-09-23 DIAGNOSIS — L97809 Non-pressure chronic ulcer of other part of unspecified lower leg with unspecified severity: Secondary | ICD-10-CM

## 2010-09-23 LAB — DIFFERENTIAL
Basophils Absolute: 0 10*3/uL (ref 0.0–0.1)
Basophils Relative: 0 % (ref 0–1)
Eosinophils Absolute: 0.9 10*3/uL — ABNORMAL HIGH (ref 0.0–0.7)
Eosinophils Relative: 12 % — ABNORMAL HIGH (ref 0–5)
Neutrophils Relative %: 48 % (ref 43–77)

## 2010-09-23 LAB — GLUCOSE, CAPILLARY
Glucose-Capillary: 154 mg/dL — ABNORMAL HIGH (ref 70–99)
Glucose-Capillary: 190 mg/dL — ABNORMAL HIGH (ref 70–99)

## 2010-09-23 LAB — BASIC METABOLIC PANEL
BUN: 20 mg/dL (ref 6–23)
GFR calc Af Amer: >60 mL/min (ref >60)
GFR calc non Af Amer: 52 mL/min — ABNORMAL LOW (ref >60)
Potassium: 4.1 mEq/L (ref 3.5–5.1)

## 2010-09-23 LAB — CBC
MCV: 92.9 fL (ref 78.0–100.0)
Platelets: 169 10*3/uL (ref 150–400)
RDW: 16.6 % — ABNORMAL HIGH (ref 11.5–15.5)
WBC: 7.7 10*3/uL (ref 4.0–10.5)

## 2010-09-24 LAB — GLUCOSE, CAPILLARY
Glucose-Capillary: 116 mg/dL — ABNORMAL HIGH (ref 70–99)
Glucose-Capillary: 116 mg/dL — ABNORMAL HIGH (ref 70–99)

## 2010-09-25 DIAGNOSIS — L0390 Cellulitis, unspecified: Secondary | ICD-10-CM

## 2010-09-25 DIAGNOSIS — I739 Peripheral vascular disease, unspecified: Secondary | ICD-10-CM

## 2010-09-25 DIAGNOSIS — L98499 Non-pressure chronic ulcer of skin of other sites with unspecified severity: Secondary | ICD-10-CM

## 2010-09-25 DIAGNOSIS — L0291 Cutaneous abscess, unspecified: Secondary | ICD-10-CM

## 2010-09-25 LAB — GLUCOSE, CAPILLARY: Glucose-Capillary: 134 mg/dL — ABNORMAL HIGH (ref 70–99)

## 2010-09-25 LAB — CULTURE, BLOOD (ROUTINE X 2)
Culture  Setup Time: 201202150917
Culture  Setup Time: 201202150917
Culture: NO GROWTH

## 2010-09-25 LAB — PROTIME-INR
INR: 1 (ref 0.00–1.49)
Prothrombin Time: 13.4 seconds (ref 11.6–15.2)

## 2010-09-26 NOTE — Discharge Summary (Signed)
NAME:  Nicholas [name], Nicholas [name] NO.:  [id number]  MEDICAL RECORD NO.:  [id number]          PATIENT TYPE:  INP  LOCATION:  3008                         FACILITY:  [facility]  PHYSICIAN:  Candyce Churn, M.D.DATE OF BIRTH:  [birth year]  DATE OF ADMISSION:  08/24/2010 DATE OF DISCHARGE:  08/29/2010                              DISCHARGE SUMMARY   DISCHARGE DIAGNOSES: 1. Left lower extremity cellulitis, improved. 2. Diabetic foot ulcer, left foot arch. 3. Chronic lower extremity edema, multifactorial - secondary to lower     extremity venous insufficiency, chronic dependency, and congestive     heart failure. 4. Type 2 diabetes mellitus. 5. Hypertension. 6. Coronary artery disease. 7. Gout. 8. Hyperlipidemia. 9. Obstructive sleep apnea. 10.Diastolic heart failure. 11.History of diverticulosis. 12.History of mild renal insufficiency with baseline creatinine of     approximately 0.5.  DISCHARGE MEDICATIONS: 1. Fortaz 1 g intravenously b.i.d. for 14 days. 2. Vancomycin 1500 mg q.24 h. for 14 days. 3. Lantus 40-60 units subcutaneously at bedtime. 4. Allopurinol 300 mg daily. 5. Alprazolam 1 mg 1-2 tablets b.i.d. 6. Atenolol 50 mg q.12 h. 7. Combigan ophthalmic solution 1 drop in left eye daily. 8. Depakote 250 mg daily at bedtime. 9. Lasix 160 mg p.o. q.a.m. 10.Glipizide 5 mg b.i.d. 11.Vicodin 7.5/750 one tablet q.6 h. p.r.n. pain. 12.Imdur 30 mg daily. 13.Potassium chloride 20 mEq 3 tablets t.i.d. 14.Metolazone 5 mg on Mondays and Fridays each week. 15.Sublingual nitroglycerin 0.4 mg every 5 minutes as needed for chest     pain. 16.Omeprazole 20 mg b.i.d. 17.Patanol ophthalmic solution 0.1% one drop in both eyes b.i.d. 18.Plavix 75 mg daily. 19.Simvastatin 20 mg each evening. 20.Temazepam 30 mg each evening. 21.Terazosin 5 mg each evening. 22.Timolol 0.5% ophthalmic solution, one drop in left eye at bedtime. 23.Xalatan 0.005% ophthalmic solution, one drop in  both eyes at     bedtime.  ALLERGIES:  No known drug allergies.  DIET:  No concentrated sweets and low salt.  HOSPITAL COURSE:  Nicholas [name] was admitted on August 24, 2010, with swollen erythematous legs, left greater than right with a very tender left leg.  The ulcer in the arch of the left foot was nondraining.  Had good granulation tissue.  He was cultured and started on Fortaz and vancomycin.  He rapidly improved over the next several days.  He had been on daily antibiotic therapy relatively broad-spectrum on Augmentin with failure of that therapy.  He will be discharged home with a PICC in the right arm and he will receive 2 more weeks of vancomycin and Fortaz via [facility].  He will follow up in 2 weeks and check a vancomycin trough level in 1 week.  Other medical problems were stable during the hospitalization including type 2 diabetes, hypertension, and heart failure.  The patient's lower extremities were much improved with less edema and erythema and no tenderness at the time of discharge.  DISCHARGE VITAL SIGNS:  Temperature 98.1, pulse 62 and regular, respiratory rate 16 and nonlabored, blood pressure 121/57, O2 saturation on room air 96%, weight was 127.1 kg, height 70 inches.  1. X-ray of  his tibia and fibula on August 24, 2009, revealed a soft     tissue edema, but no osseous findings to suggest osteomyelitis. 2. Chest x-ray revealed stable pulmonary venous hypertension with low     lung volumes and otherwise clear.  He was status post CABG. 3. PICC line placement was successful on August 28, 2010.  DISCHARGE LABORATORY DATA:  Vancomycin trough on August 28, 2010, was 21.8 and vancomycin dose was lowered.  TSH was 0.795.  Blood cultures x2 were negative from August 24, 2010.  CBC from August 24, 2010, revealed a white count of 14,000, hemoglobin 13.6, platelet count 182,000 with 80% neutrophils on differential.  Sodium 136,  potassium 4.0, chloride 101, bicarb 26, glucose 96, BUN 17, creatinine 1.27, and BNP was 142.  Hemoglobin A1c was 6.7% with a mean plasma glucose of 146 and liver functions on admission were within normal limits.  Albumin slightly low at 2.7.  CONDITION ON DISCHARGE:  Much improved and I will plan to see the patient back in the office in 2 weeks.  Time spent reviewing chart, discussing discharge with the patient, and performing discharge summary was 35 minutes.    Candyce Churn, M.D.    RNG/MEDQ  D:  08/29/2010  T:  08/29/2010  Job:  [id number]  Electronically Signed by Marden Noble M.D. on 09/26/2010 08:15:06 PM

## 2010-09-27 NOTE — Discharge Summary (Signed)
NAME:  Nicholas [name], Nicholas [name] NO.:  [id number]  MEDICAL RECORD NO.:  [id number]           PATIENT TYPE:  I  LOCATION:  3032                         FACILITY:  [facility]  PHYSICIAN:  Candyce Churn, M.D.DATE OF BIRTH:  [birth year]  DATE OF ADMISSION:  09/19/2010 DATE OF DISCHARGE:  09/26/2010                              DISCHARGE SUMMARY   DISCHARGE DIAGNOSES: 1. Peripheral vascular disease with left lower extremity arterial     insufficiency. 2. Chronic left foot arch ulcer. 3. Left lower extremity cellulitis and edema markedly improved. 4. History of stage IV sacral decubitus ulcer. 5. Coronary artery disease status post coronary artery bypass     grafting. 6. Diastolic congestive heart failure. 7. Type 2 diabetes mellitus. 8. Chronic renal insufficiency with baseline creatinine 1.36, BUN 20. 9. Dyslipidemia. 10.Chronic venous insufficiency. 11.Obstructive sleep apnea. 12.Morbid obesity. 13.Hypertension. 14.Diverticulosis. 15.Benign prostatic hypertrophy. 16.History of glaucoma. 17.History of methicillin-resistant Staphylococcus aureus in 2007. 18.History of gout. 19.Schizoaffective disorder and depression. 20.Insomnia. 21.Gastroesophageal reflux disease. 22.Constipation. 23.Glaucoma. 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  ALLERGIES:  No known drug allergies.  DISCHARGE MEDICATIONS: 1. Sliding-scale NovoLog insulin as per prior to hospitalization. 2. Doxycycline 100 mg b.i.d. for 30 days. 3. Rifampin 300 mg b.i.d. for 30 days. 4. Lasix 80 mg 1-2 tablets daily, increased dose to 2 tablets if lower     extremity edema starts to recur. 5. Lantus insulin 30 units subcu nightly. 6. Allopurinol 300 mg daily. 7. Alprazolam 1 mg 1-2 tablets orally twice daily p.r.n. 8. Atenolol 50 mg 1 p.o. b.i.d. 9. Combigan ophthalmic solution 1 drop in left eye daily. 10.Extended-release divalproex 250  mg 1 p.o. at bedtime. 11.Glipizide 5 mg b.i.d. 12.Hydrocodone 7.5/750 one tablet p.o. q.6 h. p.r.n. pain. 13.Metolazone 5 mg daily. 14.Sublingual nitroglycerin 0.4 mg under tongue q.5 minutes p.r.n. 15.Omeprazole 20 mg b.i.d. 16.Patanol eye drops 0.1% 1 drop in both eyes b.i.d. 17.Plavix 75 mg daily. 18.Simvastatin 20 mg every evening. 19.Temazepam 30 mg p.o. nightly. 20.Terazosin 5 mg p.o. at bedtime. 21.Timolol ophthalmic solution 0.5% 1 drop in left eye at bedtime. 22.Xalatan ophthalmic 0.005% 1 drop in both eyes at bedtime.  CONSULTATIONS: 1. Infectious Disease, Dr. Johny Sax. 2. CVTS - V. Charlena Cross, MD, September 21, 2010.  PROCEDURES:  September 25, 2010, abdominal aortogram and bilateral lower extremity runoff - findings included left popliteal stenosis and left proximal anterior tibial artery stenosis which will be treated with atherectomy in 1 week per plans.  RADIOLOGY: 1. MRI of the left foot performed on September 20, 2010, revealed a     prominent irregular inner margin of the soft tissue mass in the     plantar aspect of the foot, likely representing chronic     inflammation associated with wound on the arch of the foot.  Does     not appear to be an abscess.  No evidence of osteomyelitis. 2. Chest x-ray September 19, 2010, cardiomegaly without acute disease.  DISCHARGE LABORATORY FINDINGS:  Blood cultures x2 from September 19, 2010, revealing no growth.  Basic  metabolic profile September 23, 2010, sodium 144, potassium 4.1, chloride 102, bicarb 32, glucose 164, BUN 20, creatinine 1.36, vancomycin trough from September 21, 2010, 13.7.  Urine culture from September 19, 2010, revealed no growth and CBC from September 20, 2010, revealed a white count of 10,800, hemoglobin 12.5, platelet count 134,000.  CBC on admission revealed a white count of 21,500, hemoglobin 13.9, platelet count 163,000.  TSH from August 25, 2010, was 0.795.  HOSPITAL COURSE:  Nicholas [name] has had multiple admissions over the past couple of years for left lower extremity cellulitis.  He has a Charcot joint involving the right knee.  In order to ambulate he needs the function of his left leg.  He was admitted and had 4+ edema with erythema and high fever and leukocytosis on admission.  He was started on intravenous Zosyn and vancomycin and has been continued on vancomycin for a total of 8 days. He will be discharged home on rifampin and doxycycline and that it seemed that he is responding to a MRSA coverage regimen.  He does have a culture from 2006 from the left foot that revealed MRSA.  With leg elevation and intravenous antibiotic therapy, the patient has had marked improvement of the left lower extremity with no erythema on discharge and the leg is approximately half the size that it was on admission with 4+ edema.  He does have popliteal and posterior tibial occlusive disease and attempts will be made for atherectomy next week per Dr. Myra Gianotti.  We will discharge home on medications as above with rifampin and doxycycline and appreciative of the help from Infectious Diseases as well as the Vascular Surgery.  Time spent on discharge summary was 45 minutes.     Candyce Churn, M.D.     RNG/MEDQ  D:  09/26/2010  T:  09/27/2010  Job:  [id number]  cc:   Lacretia Leigh. Ninetta Lights, M.D. Jorge Ny, MD  Electronically Signed by Marden Noble M.D. on 09/27/2010 05:18:20 AM

## 2010-10-02 ENCOUNTER — Observation Stay (HOSPITAL_COMMUNITY)
Admission: RE | Admit: 2010-10-02 | Discharge: 2010-10-03 | Disposition: A | Discharge status: Home / Self Care | Payer: Medicare Other | Source: Ambulatory Visit | Attending: Surgery | Admitting: Surgery

## 2010-10-02 DIAGNOSIS — I739 Peripheral vascular disease, unspecified: Principal | ICD-10-CM | POA: Insufficient documentation

## 2010-10-02 DIAGNOSIS — L98499 Non-pressure chronic ulcer of skin of other sites with unspecified severity: Principal | ICD-10-CM | POA: Insufficient documentation

## 2010-10-02 LAB — POCT I-STAT, CHEM 8
BUN: 46 mg/dL — ABNORMAL HIGH (ref 6–23)
Calcium, Ion: 1.02 mmol/L — ABNORMAL LOW (ref 1.12–1.32)
Creatinine, Ser: 1.6 mg/dL — ABNORMAL HIGH (ref 0.4–1.5)
TCO2: 23 mmol/L (ref 0–100)

## 2010-10-02 LAB — GLUCOSE, CAPILLARY
Glucose-Capillary: 96 mg/dL (ref 70–99)
Glucose-Capillary: 111 mg/dL — ABNORMAL HIGH (ref 70–99)

## 2010-10-02 LAB — POCT ACTIVATED CLOTTING TIME: Activated Clotting Time: 152 seconds

## 2010-10-03 LAB — BASIC METABOLIC PANEL
BUN: 41 mg/dL — ABNORMAL HIGH (ref 6–23)
CO2: 23 mEq/L (ref 19–32)
Calcium: 9.1 mg/dL (ref 8.4–10.5)
GFR calc non Af Amer: 51 mL/min — ABNORMAL LOW (ref >60)
Glucose, Bld: 157 mg/dL — ABNORMAL HIGH (ref 70–99)
Potassium: 3.7 mEq/L (ref 3.5–5.1)
Sodium: 140 mEq/L (ref 135–145)

## 2010-10-03 LAB — CBC
HCT: 42.4 % (ref 39.0–52.0)
Hemoglobin: 13.9 g/dL (ref 13.0–17.0)
MCH: 29.3 pg (ref 26.0–34.0)
MCHC: 32.8 g/dL (ref 30.0–36.0)
RDW: 17.3 % — ABNORMAL HIGH (ref 11.5–15.5)

## 2010-10-03 LAB — GLUCOSE, CAPILLARY

## 2010-10-07 NOTE — Op Note (Addendum)
NAME:  Nicholas [name], Nicholas [name]              ACCOUNT NO.:  [id number]  MEDICAL RECORD NO.:  [id number]           PATIENT TYPE:  I  LOCATION:  6533                         FACILITY:  [facility]  PHYSICIAN:  Juleen China IV, MDDATE OF BIRTH:  02-05-1941  DATE OF PROCEDURE:  10/02/2010 DATE OF DISCHARGE:                              OPERATIVE REPORT   PREOPERATIVE DIAGNOSIS:  Left leg ulcer.  POSTOPERATIVE DIAGNOSIS:  Left leg ulcer.  PROCEDURE PERFORMED: 1. Ultrasound access right femoral artery. 2. Atherectomy and angioplasty, left popliteal artery. 3. Angioplasty, left anterior tibial artery.  INDICATIONS:  Nicholas [name] is a 70 year old gentleman with multiple comorbidities who last week underwent diagnostic arteriogram, which showed stenosis and has popliteal artery behind the knee joint and the stenosis within his anterior tibial artery.  He comes in today for his intervention.  PROCEDURE:  The patient was identified in the holding area and taken to room 8, placed supine on the table.  The right groin was prepped and draped in usual fashion.  A time-out was called.  Ultrasound was used to evaluate the right common femoral artery.  It was patent with mild calcification.  A digital image was acquired with ultrasound.  The right femoral artery was then accessed under ultrasound guidance with an micropuncture needle.  An 0.18 wire was then advanced without resistance and a micropuncture sheath was placed.  This was followed by a Bentson wire and a 7-French Ansell one sheath.  At this point, the patient was given systemic heparinization.  Heparinization was monitored with an ACT.  An Omniflush catheter and Bentson wire were used to cross the aortic bifurcation.  The Omniflush catheter was removed from the sheath was then replaced and the sheath was advanced into the left superficial femoral artery.  I then used an 0.14 SpartaCore wire and advanced this into the distal anterior tibial  artery.  Contrast injections were performed to identify the stenosis.  I switched out for viper wire and then primary balloon angioplasty was performed in the anterior tibial artery using a Fox SV 3 x 100 balloon.  Angioplasty approximately 130 mm of the anterior tibial artery beginning at its proximal extending distally.  This was the diseased portion of the anterior tibial artery. This required 2 balloon inflations taking the balloon to 8 atmospheres in the holding area for 2 minutes on each insufflation.  Followup arteriogram revealed resolution of stenosis within the anterior tibial artery.  The balloon was removed and I then selected a CSI diamond back orbital portal atherectomy 2.0 catheter.  Atherectomy was then performed in the popliteal artery beginning as the wire across the bone edge and extending just past the tibial plateau.  This was performed initially on low speed followed by medium and then high-speed.  I then used a 4-mm balloon for angioplasty of this area.  Completion arteriogram was performed with residual stenosis noted and therefore I upsized to a 5-mm balloon.  This was taken to 5 atmospheres and held out for 2 minutes. Completion angiogram was then performed, which showed significant improvement in the blood flow across the popliteal stenosis and a widely patent  anterior tibial artery.  The patient had preservation of three- vessel runoff to the ankle with the anterior tibial artery being the dominant vessel across the ankle.  At this point in time, decision was made to terminate the procedure.  Catheters and wires removed.  The patient was taken to holding area for sheath pull.  IMPRESSION: 1. Successful balloon angioplasty of the left anterior tibial artery     using an 3-mm Fox SV balloon. 2. Successful orbital atherectomy and angioplasty of the left     popliteal artery using a 2.0 CSI device and a 5-mm balloon.     Jorge Ny,  MD     VWB/MEDQ  D:  10/02/2010  T:  10/03/2010  Job:  [id number]  Electronically Signed by Arelia Longest IV MD on 10/07/2010 07:40:28 PM

## 2010-10-07 NOTE — Op Note (Addendum)
NAME:  Nicholas [name], Nicholas [name]              ACCOUNT NO.:  [id number]  MEDICAL RECORD NO.:  [id number]           PATIENT TYPE:  I  LOCATION:  3032                         FACILITY:  [facility]  PHYSICIAN:  Juleen China IV, MDDATE OF BIRTH:  [birth year]  DATE OF PROCEDURE:  09/25/2010 DATE OF DISCHARGE:                              OPERATIVE REPORT   PREOPERATIVE DIAGNOSES:  Left leg ulcer.  POSTOPERATIVE DIAGNOSIS:  Left leg ulcer.  PROCEDURES: 1. Ultrasound access right femoral artery. 2. Abdominal aortogram. 3. Bilateral lower extremity runoff. 4. Second-order catheterization.  INDICATION:  This is a 70 year old gentleman with chronic left leg ulcer found to have diminished ankle brachial indices by ultrasound and comes in today for arteriogram and possible intervention.  PROCEDURE:  The patient was identified in the holding area and taken to room 8, placed supine on the table.  The right groin was prepped and draped in usual fashion.  Time-out was called.  The right femoral artery was evaluated with ultrasound and found to be calcified, but patent.  A digital image was acquired.  The artery was accessed with ultrasound guidance with micropuncture needle.  No wire was advanced without resistance.  Micropuncture sheath was placed.  An Amplatz superstiff wire was placed through a 4-French end-hole catheter followed by 5- French sheath, Omniflush catheter was advanced to the level of L1. Abdominal aortogram was obtained.  Using Omniflush catheter and Bentson wire, the aortic bifurcation was crossed.  Catheter was placed in left external iliac artery and left leg runoff was performed.  Next, right leg runoff was evaluated via retrograde sheath injection.  FINDINGS:  Aortogram:  Visualized portion of the suprarenal abdominal aorta showed no significant disease.  There is a single left renal artery, which is widely patent without significant stenosis.  There is a proximal accessory right  renal artery, which is patent.  The main right renal artery was closed, supplies the lower pole, comes off in the mid abdominal aorta.  The right common iliac artery is widely patent.  The right hypogastric artery is widely patent.  Right external iliac are is widely patent.  There is approximately 40% to 50% stenosis in the left distal common iliac artery.  The left hypogastric artery is patent. Left external iliac artery is widely patent.  Left lower extremity:  Left common femoral artery is calcified with irregular-looking stenosis resembling F and D, but is widely patent without significant stenosis.  The profunda femoral artery is widely patent, the superficial femoral artery also shows irregular luminal narrowing, but no significant stenosis identified.  The popliteal artery has an area of about 60% to 70% stenosis right behind the joint space. There is three-vessel runoff to the ankle.  There is proximal stenosis in the anterior tibial artery.  The posterior tibial artery occludes at the ankle.  Plantar arch is not intact.  Right lower extremity:  The right common femoral artery is widely patent.  The right profunda femoral artery is not well visualized due to a low access site.  The right superficial femoral artery is widely patent.  The right popliteal artery is widely patent with only  mild-to- moderate disease.  There is two-vessel runoff via the peroneal and posterior tibial artery.  At this point in time, decision was made to terminate procedure based of the location of the patient's disease.  We will bring him back for atherectomy in 1 week.  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  Due to the location of the disease, this will be best treated with atherectomy device, which will be done in 1 week.     Jorge Ny, MD     VWB/MEDQ  D:  09/25/2010  T:  09/26/2010  Job:  [id number]  Electronically Signed by Arelia Longest IV MD on 10/07/2010 07:40:25 PM

## 2010-10-08 NOTE — H&P (Signed)
NAME:  Nicholas [name], Nicholas [name] NO.:  [id number]  MEDICAL RECORD NO.:  [id number]           PATIENT TYPE:  I  LOCATION:  3032                         FACILITY:  [facility]  PHYSICIAN:  Della Goo, M.D. DATE OF BIRTH:  1941-07-13  DATE OF ADMISSION:  09/19/2010 DATE OF DISCHARGE:                             HISTORY & PHYSICAL   PRIMARY CARE PHYSICIAN:  Candyce Churn, MD  CHIEF COMPLAINT:  Fever, chills, draining left foot ulcer.  HISTORY OF PRESENT ILLNESS:  This is a 70 year old male with multiple medical problems including a history of MRSA since 2007 with a stage IV sacral decubitus ulcer, which has been slowly healing and a poorly healing ulcer of the left foot.  The patient presents with fever and chills and shakes started in the evening.  His temperature prior to arrival was 101.  In the emergency department, it was found to be 103.1. The patient has a left lower extremity wound on the plantar surface of the foot, which has been treated at the [facility], and his last visit was Tuesday, September 18, 2010.  The patient was recently hospitalized for cellulitis of the left lower leg and placed on antibiotic therapy of vancomycin and Fortaz while he was hospitalized and was discharged on vancomycin and Fortaz to complete a course of 14 days.  He was hospitalized from August 24, 2010, until August 29, 2010.  The patient's wife reports the patient continues to have drainage from the foot wound.  The patient has also had confusion this evening with the fever.  PAST MEDICAL HISTORY:  Significant for: 1. Coronary artery disease, status post coronary artery bypass     grafting. 2. Diastolic congestive heart failure syndrome. 3. Type 2 diabetes mellitus. 4. Chronic renal insufficiency. 5. Dyslipidemia. 6. Chronic venous insufficiency. 7. Obstructive sleep apnea. 8. Morbid obesity. 9. Hypertension. 10.History of diverticulosis. 11.Benign prostatic  hypertrophy. 12.History of glaucoma. 13.He has a history of MRSA in 2007. 14.The patient has a history of gout.  MEDICATIONS:  Lantus insulin, allopurinol, alprazolam, atenolol, Combigan ophthalmic drops, Depakote, Lasix, glipizide, Vicodin, Imdur, potassium chloride, metolazone on Mondays and Fridays, sublingual nitroglycerin p.r.n., omeprazole, Patanol ophthalmic drops, Plavix, simvastatin, temazepam, terazosin, timolol ophthalmic drops, Xalatan ophthalmic drops.  ALLERGIES:  No known drug allergies.  SOCIAL HISTORY:  The patient is married.  He is a nonsmoker, nondrinker. No history of illicit drug usage.  FAMILY HISTORY:  Positive for hypertension and cancer.  REVIEW OF SYSTEMS:  Pertinent as mentioned above.  PHYSICAL EXAMINATION FINDINGS:  GENERAL:  This is a morbidly obese native American male who is in discomfort in mild distress. VITAL SIGNS:  Temperature max 103.1, blood pressure 133/49, heart rate 102, respirations 22, O2 saturation 96%. HEENT:  Normocephalic, atraumatic.  Pupils are equally round andreactive to light.  Extraocular movements are intact.  Funduscopic benign.  There is no scleral icterus.  Nares are patent bilaterally. Oropharynx is clear. NECK:  Supple.  Full range of motion.  No thyromegaly, adenopathy, or jugular venous distention. CARDIOVASCULAR:  Tachycardic rate and rhythm.  No murmurs, gallops, or rubs appreciated. LUNGS:  Clear to auscultation bilaterally.  No rales, rhonchi, or wheezes. ABDOMEN:  Positive bowel sounds, obese, soft, nontender, nondistended. No hepatosplenomegaly. EXTREMITIES:  With 3+ edema of the bilateral lower extremities.  The patient has erythema to the pretibial areas on the anterior aspect of the left lower extremity.  He has a foot ulcer on the plantar surface of the left foot.  The patient has 3+ edema of the lower extremity. BACK:  The patient has a large stage IV decubitus ulceration, which is chronic and has  surgical changes.  There is no exudation or signs of infection around the wound.  This wound is across his sacrum.  There are irregular margins and surgical changes. NEUROLOGIC:  The patient has generalized weakness.  He is able to move his upper extremities.  He has 3/5 strength in the lower extremities. There is no asymmetry in the strength of the lower extremities.  Gait has not been assessed.  The patient walks with a walker per report of the wife.  LABORATORY STUDIES:  White blood cell count 17.1, hemoglobin 14.7, hematocrit 44.8, MCV 90.9, platelets 179.  Sodium 138, potassium 3.3, chloride 95, carbon dioxide 29, BUN 32, creatinine 1.48, and glucose 150.  Point-of-care cardiac markers, myoglobin greater than 500, CK-MB 1.2, troponin less than 0.05.  Chest x-ray reveals cardiomegaly, but no acute disease findings.  ASSESSMENT:  A 70 year old male being admitted with: 1. Cellulitis of the left lower extremity. 2. Sepsis. 3. Infected left foot wound and rule out osteomyelitis. 4. Type 2 diabetes mellitus. 5. Hypertension. 6. Mild hypokalemia. 7. Coronary artery disease.  PLAN:  The patient will be admitted.  Blood cultures x2 have been ordered, and the patient has been placed on empiric antibiotic therapy at this time of vancomycin and Zosyn.  IV fluids have been ordered for fluid resuscitation and antipyretic therapy has been ordered as needed for fever.  Wound care evaluations will be ordered and an MRI study of the left foot will be ordered to evaluate for possible osteomyelitis.  The patient's regular medications will be further reconciled and sliding scale insulin coverage has been ordered.  The patient will be placed in MRSA precautions.  The patient will be placed on DVT prophylaxis and the patient is a full code at this time.     Della Goo, M.D.     HJ/MEDQ  D:  09/19/2010  T:  09/19/2010  Job:  [id number]  cc:   Candyce Churn, M.D.  Electronically  Signed by Della Goo M.D. on 10/08/2010 08:21:14 PM

## 2010-10-09 ENCOUNTER — Encounter (HOSPITAL_BASED_OUTPATIENT_CLINIC_OR_DEPARTMENT_OTHER): Payer: Medicare Other | Attending: Internal Medicine

## 2010-10-09 DIAGNOSIS — E1169 Type 2 diabetes mellitus with other specified complication: Secondary | ICD-10-CM | POA: Insufficient documentation

## 2010-10-09 DIAGNOSIS — L97809 Non-pressure chronic ulcer of other part of unspecified lower leg with unspecified severity: Secondary | ICD-10-CM | POA: Insufficient documentation

## 2010-10-09 DIAGNOSIS — L8992 Pressure ulcer of unspecified site, stage 2: Secondary | ICD-10-CM | POA: Insufficient documentation

## 2010-10-09 DIAGNOSIS — B353 Tinea pedis: Secondary | ICD-10-CM | POA: Insufficient documentation

## 2010-10-09 DIAGNOSIS — L89109 Pressure ulcer of unspecified part of back, unspecified stage: Secondary | ICD-10-CM | POA: Insufficient documentation

## 2010-10-15 LAB — GLUCOSE, CAPILLARY
Glucose-Capillary: 108 mg/dL — ABNORMAL HIGH (ref 70–99)
Glucose-Capillary: 117 mg/dL — ABNORMAL HIGH (ref 70–99)
Glucose-Capillary: 120 mg/dL — ABNORMAL HIGH (ref 70–99)
Glucose-Capillary: 131 mg/dL — ABNORMAL HIGH (ref 70–99)
Glucose-Capillary: 132 mg/dL — ABNORMAL HIGH (ref 70–99)
Glucose-Capillary: 133 mg/dL — ABNORMAL HIGH (ref 70–99)
Glucose-Capillary: 139 mg/dL — ABNORMAL HIGH (ref 70–99)
Glucose-Capillary: 142 mg/dL — ABNORMAL HIGH (ref 70–99)
Glucose-Capillary: 157 mg/dL — ABNORMAL HIGH (ref 70–99)
Glucose-Capillary: 176 mg/dL — ABNORMAL HIGH (ref 70–99)
Glucose-Capillary: 87 mg/dL (ref 70–99)

## 2010-10-15 LAB — BASIC METABOLIC PANEL
BUN: 18 mg/dL (ref 6–23)
CO2: 25 mEq/L (ref 19–32)
CO2: 25 mEq/L (ref 19–32)
Calcium: 8.6 mg/dL (ref 8.4–10.5)
Calcium: 9 mg/dL (ref 8.4–10.5)
Chloride: 105 mEq/L (ref 96–112)
Chloride: 106 mEq/L (ref 96–112)
Creatinine, Ser: 1.46 mg/dL (ref 0.4–1.5)
GFR calc Af Amer: 58 mL/min — ABNORMAL LOW (ref >60)
GFR calc Af Amer: >60 mL/min (ref >60)
GFR calc Af Amer: >60 mL/min (ref >60)
GFR calc non Af Amer: 51 mL/min — ABNORMAL LOW (ref >60)
GFR calc non Af Amer: 56 mL/min — ABNORMAL LOW (ref >60)
Glucose, Bld: 101 mg/dL — ABNORMAL HIGH (ref 70–99)
Glucose, Bld: 147 mg/dL — ABNORMAL HIGH (ref 70–99)
Potassium: 3.3 mEq/L — ABNORMAL LOW (ref 3.5–5.1)
Potassium: 3.3 mEq/L — ABNORMAL LOW (ref 3.5–5.1)
Potassium: 4 mEq/L (ref 3.5–5.1)
Sodium: 140 mEq/L (ref 135–145)
Sodium: 142 mEq/L (ref 135–145)
Sodium: 144 mEq/L (ref 135–145)

## 2010-10-15 LAB — DIFFERENTIAL
Basophils Relative: 1 % (ref 0–1)
Eosinophils Absolute: 0 10*3/uL (ref 0.0–0.7)
Eosinophils Relative: 0 % (ref 0–5)
Lymphs Abs: 1.3 10*3/uL (ref 0.7–4.0)
Monocytes Absolute: 1 10*3/uL (ref 0.1–1.0)

## 2010-10-15 LAB — CBC
HCT: 41.7 % (ref 39.0–52.0)
MCH: 29.9 pg (ref 26.0–34.0)
MCH: 28.4 pg (ref 26.0–34.0)
MCHC: 33.3 g/dL (ref 30.0–36.0)
MCHC: 31.2 g/dL (ref 30.0–36.0)
MCV: 89.9 fL (ref 78.0–100.0)
Platelets: ADEQUATE 10*3/uL (ref 150–400)
RBC: 4.85 MIL/uL (ref 4.22–5.81)
RDW: 16.5 % — ABNORMAL HIGH (ref 11.5–15.5)

## 2010-10-15 LAB — URINE MICROSCOPIC-ADD ON

## 2010-10-15 LAB — LIPID PANEL
HDL: 29 mg/dL — ABNORMAL LOW (ref >39)
Total CHOL/HDL Ratio: 3.9 RATIO
VLDL: 30 mg/dL (ref 0–40)

## 2010-10-15 LAB — URINALYSIS, ROUTINE W REFLEX MICROSCOPIC
Leukocytes, UA: NEGATIVE
Nitrite: NEGATIVE
Protein, ur: 30 mg/dL — AB
Specific Gravity, Urine: 1.015 (ref 1.005–1.030)
Urobilinogen, UA: 1 mg/dL (ref 0.0–1.0)

## 2010-10-15 LAB — CULTURE, BLOOD (ROUTINE X 2)
Culture  Setup Time: 201112241116
Culture  Setup Time: 201112241116
Culture: NO GROWTH

## 2010-10-15 LAB — TROPONIN I: Troponin I: 0.05 ng/mL (ref 0.00–0.06)

## 2010-10-15 LAB — POCT I-STAT, CHEM 8
Chloride: 108 mEq/L (ref 96–112)
Creatinine, Ser: 1.7 mg/dL — ABNORMAL HIGH (ref 0.4–1.5)
Glucose, Bld: 91 mg/dL (ref 70–99)
Potassium: 4.2 mEq/L (ref 3.5–5.1)

## 2010-10-15 LAB — POCT CARDIAC MARKERS
CKMB, poc: 2.8 ng/mL (ref 1.0–8.0)
Troponin i, poc: <0.05 ng/mL (ref 0.00–0.09)

## 2010-10-15 LAB — CK TOTAL AND CKMB (NOT AT ARMC)
CK, MB: 2.2 ng/mL (ref 0.3–4.0)
CK, MB: 2.4 ng/mL (ref 0.3–4.0)
Relative Index: 1.3 (ref 0.0–2.5)
Total CK: 168 U/L (ref 7–232)

## 2010-10-15 LAB — MRSA PCR SCREENING: MRSA by PCR: POSITIVE — AB

## 2010-10-15 LAB — URINE CULTURE
Colony Count: NO GROWTH
Culture: NO GROWTH

## 2010-10-15 NOTE — Consult Note (Signed)
NAME:  Nicholas [name], Nicholas [name] NO.:  [id number]  MEDICAL RECORD NO.:  [id number]           PATIENT TYPE:  I  LOCATION:  3032                         FACILITY:  [facility]  PHYSICIAN:  Quita Skye. Hart Rochester, M.D.  DATE OF BIRTH:  Jun 09, 1941  DATE OF CONSULTATION: DATE OF DISCHARGE:                                CONSULTATION   REASON FOR CONSULTATION:  Nonhealing wound of the left foot with decreased ABIs.  HISTORY OF PRESENT ILLNESS:  The patient is a 70 year old male who was admitted on September 19, 2010, for left lower extremity cellulitis.  The patient has a longstanding history of a nonhealing wound on the plantar aspect of the left foot.  The patient states he had a mass removed from this area is greater than 10 years ago and after long periods, the wound healed.  Per the patient's wife, he developed a new wound in the same place approximately 3 years ago and [facility] with local wound care treatment since that time.  The patient also has chronic bilateral lower extremity edema.  The patient had multiple episodes of left lower extremity cellulitis over the past several years as well.  He has a history of bilateral knee surgeries, one of which resulted in a MRSA sepsis in 2007.  The patient had CABG x2, with harvest of bilateral lower extremities saphenous vein in the past as well.  MRI done during this hospitalization of the left lower extremity revealed no osteomyelitis.  ABIs were performed and this revealed 0.66 on the right and 0.5 on the left.  The patient is not very mobile therefore claudication symptoms cannot be assessed.  He denies rest pain.  He also has a nonhealing sacral wound which is being [facility].  The patient denies amaurosis, dysphagia, dysarthria, hemiparesis, and hemiplegia.  The patient also denies chest pain, nausea, vomiting, diarrhea, constipation.  Patient's medical history is significant for  diabetes mellitus type 2, hypertension, hyperlipidemia, chronic renal insufficiency, and diastolic congestive heart failure, all of which are medically managed.  Vascular Surgery consult has been obtained for options regarding decreased ABIs and a nonhealing wound to the left foot.  PAST MEDICAL HISTORY: 1. Coronary artery disease status post coronary artery bypass grafting     with repeat CABG several years later 2. Diastolic congestive heart failure. 3. Type 2 diabetes mellitus. 4. Chronic renal insufficiency with a baseline creatinine of 1.5. 5. Dyslipidemia. 6. Chronic venous insufficiency. 7. Obstructive sleep apnea. 8. Morbid obesity. 9. Hypertension. 10.History of diverticulitis. 11.BPH. 12.Glaucoma. 13.History of MRSA in 2007. 14.Bilateral knee surgeries with no replacement. 15.History of gout. 16.Nonhealing wound on the sacrum and left foot. 17.Chronic bilateral lower extremity edema. 18.Left lower extremity cellulitis currently being treated with     antibiotic.  No evidence of osteomyelitis.  ALLERGIES:  No known drug allergies.  CURRENT MEDICATIONS: 1. Lovenox 40 mg subcu daily. 2. Colace 100 mg p.o. b.i.d. 3. Vancomycin IV. 4. Sliding scale insulin. 5. NovoLog 2-5 units subcu bedtime. 6. Plavix 75 mg p.o. daily. 7. Patanol 1 drop each eye b.i.d. 8.  Protonix 40 mg b.i.d. 9. Zaroxolyn 5 mg p.o. daily. 10.Potassium chloride 50 mEq p.o. t.i.d. 11.Isosorbide mononitrate 30 mg p.o. daily. 12.Lasix 80 mg p.o. daily. 13.Depakote 250 mg p.o. bedtime. 14.Alphagan one drop both eyes daily. 15.Tenormin 50 mg p.o. b.i.d. 16.Xalatan 1 drop each eye bedtime. 17.Timolol 1 drop both eyes daily. 18.Hytrin 5 mg p.o. bedtime. 19.Temazepam 30 mg p.o. bedtime. 20.Zocor 20 mg p.o. daily. 21.Allopurinol 300 mg p.o. daily. 22.Lantus 25 units subcu bedtime. 23.Zosyn 3.375 g IV q.8. 24.Zofran 4 mg IV q.6  p.r.n.  FAMILY HISTORY: 1. Hypertension. 2. Cancer.  SOCIAL  HISTORY:  The patient lives in [place] with his wife.  He is a former smoker.  He denies illegal drugs and he occasionally drinks alcohol.  REVIEW OF SYSTEMS:  Complete review of systems is negative except as documented in   the HPI.  PHYSICAL EXAMINATION:  GENERAL/VITAL SIGNS:  The patient is morbidly obese and resting comfortably in bed.  He is afebrile with stable vital signs. HEENT:  Normocephalic, atraumatic.  PERRLA.  EOMI. CARDIAC:  Regular rate and rhythm. LUNGS:  Clear to auscultation. ABDOMEN:  Obese, soft, nontender with active bowel sounds.  The aorta is not palpable. MUSCULOSKELETAL:  There are 2+ palpable femoral pulses present bilaterally.  The pedal pulses are not palpable.  There is 2+ bilateral lower extremity edema.  Erythema up to the level of the mid tibia on the left lower extremity.  No erythema in the right lower extremity.  No wounds in the right lower extremity.  No skin breakdown in the left lower extremity.  There is a wound present on the plantar aspect of the left foot in the arch.  This wound is 2 x 2 cm with no odor and moderate exudate.  Periwound is intact with callous present.  The wound bed is pink with no evidence of bleeding.  There is no granulation tissue present. NEUROLOGIC:  Intact with no deficits. SKIN:  No rashes are noted.  LABORATORY DATA:  CBC on September 20, 2010, white count 10.8, hemoglobin 12.5, hematocrit 39.9, platelets 134.  BMP on September 19, 2010, sodium 144, potassium 3.7, BUN 32, creatinine 1.64.  GFR 42.  IMAGING:  ABIs performed on September 20, 2010: 0.66 on the right, 0.5 on the left.    Lower extremity arterial duplex completed on September 21, 2010: Preliminary results revealed diffuse calcific plaque with monophasic Doppler waveforms throughout.  There is no evidence of significant stenosis on the right.  There is a possible greater than 50% stenosis of the left distal popliteal artery.  There is possible  aorto- iliac disease.  ASSESSMENT:  A 70 year old obese male with: 1. Cellulitis of the left lower extremity. 2. Nonhealing wound of the left foot 3. Decreased ankle-brachial index with a distal popliteal artery     stenosis on the left. 4. Hypertension. 5. Diabetes mellitus type 2. 6. Coronary artery disease.  PLAN:  We will follow the patient along with the Primary Service which is the Hospitalist Team.  Infectious Disease is also involved with the patient's antibiotic regimen.  The patient would need resolution of his cellulitis.  His creatinine was 1.6 on September 19, 2010, and this is to be monitored closely.  We would consider an arteriogram, plus or minus PTA and stenting of the left lower extremity if feasible and if the infection is resolved and his creatinine is stable.  Dr. Hart Rochester will evaluate the patient and further recommendations will be made at that time.  All  the patient's medical issues and cellulitis will continue to be treated and monitored by the primary team.     Pecola Leisure, PA   ______________________________ Quita Skye. Hart Rochester, M.D.    AY/MEDQ  D:  09/21/2010  T:  09/22/2010  Job:  [id number]  Electronically Signed by Pecola Leisure PA on 10/09/2010 03:58:50 PM Electronically Signed by Josephina Gip M.D. on 10/15/2010 10:47:01 AM

## 2010-10-16 ENCOUNTER — Emergency Department (HOSPITAL_COMMUNITY): Payer: Medicare Other

## 2010-10-16 ENCOUNTER — Emergency Department (HOSPITAL_COMMUNITY)
Admission: EM | Admit: 2010-10-16 | Discharge: 2010-10-16 | Disposition: A | Discharge status: Home / Self Care | Payer: Medicare Other | Attending: Emergency Medicine | Admitting: Emergency Medicine

## 2010-10-16 DIAGNOSIS — E7800 Pure hypercholesterolemia, unspecified: Secondary | ICD-10-CM | POA: Insufficient documentation

## 2010-10-16 DIAGNOSIS — F259 Schizoaffective disorder, unspecified: Secondary | ICD-10-CM | POA: Insufficient documentation

## 2010-10-16 DIAGNOSIS — N189 Chronic kidney disease, unspecified: Secondary | ICD-10-CM | POA: Insufficient documentation

## 2010-10-16 DIAGNOSIS — E119 Type 2 diabetes mellitus without complications: Secondary | ICD-10-CM | POA: Insufficient documentation

## 2010-10-16 DIAGNOSIS — I2510 Atherosclerotic heart disease of native coronary artery without angina pectoris: Secondary | ICD-10-CM | POA: Insufficient documentation

## 2010-10-16 DIAGNOSIS — Z794 Long term (current) use of insulin: Secondary | ICD-10-CM | POA: Insufficient documentation

## 2010-10-16 DIAGNOSIS — A5211 Tabes dorsalis: Secondary | ICD-10-CM | POA: Insufficient documentation

## 2010-10-16 DIAGNOSIS — I129 Hypertensive chronic kidney disease with stage 1 through stage 4 chronic kidney disease, or unspecified chronic kidney disease: Secondary | ICD-10-CM | POA: Insufficient documentation

## 2010-10-16 DIAGNOSIS — I509 Heart failure, unspecified: Secondary | ICD-10-CM | POA: Insufficient documentation

## 2010-10-16 DIAGNOSIS — R569 Unspecified convulsions: Secondary | ICD-10-CM | POA: Insufficient documentation

## 2010-10-16 DIAGNOSIS — G4730 Sleep apnea, unspecified: Secondary | ICD-10-CM | POA: Insufficient documentation

## 2010-10-16 LAB — COMPREHENSIVE METABOLIC PANEL
ALT: 10 U/L (ref 0–53)
AST: 18 U/L (ref 0–37)
Albumin: 3.2 g/dL — ABNORMAL LOW (ref 3.5–5.2)
Alkaline Phosphatase: 79 U/L (ref 39–117)
BUN: 17 mg/dL (ref 6–23)
Chloride: 108 mEq/L (ref 96–112)
GFR calc Af Amer: >60 mL/min (ref >60)
Potassium: 4 mEq/L (ref 3.5–5.1)
Sodium: 143 mEq/L (ref 135–145)
Total Bilirubin: 0.3 mg/dL (ref 0.3–1.2)
Total Protein: 6.7 g/dL (ref 6.0–8.3)

## 2010-10-16 LAB — CBC
HCT: 42.8 % (ref 39.0–52.0)
MCHC: 31.8 g/dL (ref 30.0–36.0)
MCV: 92.6 fL (ref 78.0–100.0)
Platelets: 210 10*3/uL (ref 150–400)
RDW: 17.8 % — ABNORMAL HIGH (ref 11.5–15.5)
WBC: 10.7 10*3/uL — ABNORMAL HIGH (ref 4.0–10.5)

## 2010-10-16 LAB — DIFFERENTIAL
Eosinophils Absolute: 0.3 10*3/uL (ref 0.0–0.7)
Eosinophils Relative: 2 % (ref 0–5)
Lymphocytes Relative: 17 % (ref 12–46)
Lymphs Abs: 1.9 10*3/uL (ref 0.7–4.0)
Monocytes Absolute: 0.6 10*3/uL (ref 0.1–1.0)

## 2010-10-18 LAB — GLUCOSE, CAPILLARY
Glucose-Capillary: 102 mg/dL — ABNORMAL HIGH (ref 70–99)
Glucose-Capillary: 108 mg/dL — ABNORMAL HIGH (ref 70–99)
Glucose-Capillary: 120 mg/dL — ABNORMAL HIGH (ref 70–99)
Glucose-Capillary: 121 mg/dL — ABNORMAL HIGH (ref 70–99)
Glucose-Capillary: 124 mg/dL — ABNORMAL HIGH (ref 70–99)
Glucose-Capillary: 139 mg/dL — ABNORMAL HIGH (ref 70–99)
Glucose-Capillary: 161 mg/dL — ABNORMAL HIGH (ref 70–99)
Glucose-Capillary: 170 mg/dL — ABNORMAL HIGH (ref 70–99)
Glucose-Capillary: 77 mg/dL (ref 70–99)
Glucose-Capillary: 99 mg/dL (ref 70–99)

## 2010-10-18 LAB — URINE CULTURE
Colony Count: NO GROWTH
Culture: NO GROWTH

## 2010-10-18 LAB — CBC
HCT: 41.7 % (ref 39.0–52.0)
Hemoglobin: 12.9 g/dL — ABNORMAL LOW (ref 13.0–17.0)
MCH: 30.5 pg (ref 26.0–34.0)
MCHC: 32.5 g/dL (ref 30.0–36.0)
MCHC: 32.6 g/dL (ref 30.0–36.0)
MCV: 93.5 fL (ref 78.0–100.0)
Platelets: 202 10*3/uL (ref 150–400)
Platelets: 261 10*3/uL (ref 150–400)
RBC: 4.25 MIL/uL (ref 4.22–5.81)
RDW: 16.8 % — ABNORMAL HIGH (ref 11.5–15.5)
RDW: 16.8 % — ABNORMAL HIGH (ref 11.5–15.5)
WBC: 8.6 10*3/uL (ref 4.0–10.5)

## 2010-10-18 LAB — BRAIN NATRIURETIC PEPTIDE
Pro B Natriuretic peptide (BNP): 140 pg/mL — ABNORMAL HIGH (ref 0.0–100.0)
Pro B Natriuretic peptide (BNP): 474 pg/mL — ABNORMAL HIGH (ref 0.0–100.0)

## 2010-10-18 LAB — VANCOMYCIN, TROUGH: Vancomycin Tr: 12.3 ug/mL (ref 10.0–20.0)

## 2010-10-18 LAB — BASIC METABOLIC PANEL
BUN: 17 mg/dL (ref 6–23)
BUN: 26 mg/dL — ABNORMAL HIGH (ref 6–23)
BUN: 30 mg/dL — ABNORMAL HIGH (ref 6–23)
Calcium: 8.7 mg/dL (ref 8.4–10.5)
Calcium: 8.9 mg/dL (ref 8.4–10.5)
Calcium: 9.1 mg/dL (ref 8.4–10.5)
Chloride: 102 mEq/L (ref 96–112)
Chloride: 103 mEq/L (ref 96–112)
Creatinine, Ser: 1.41 mg/dL (ref 0.4–1.5)
Creatinine, Ser: 1.58 mg/dL — ABNORMAL HIGH (ref 0.4–1.5)
GFR calc Af Amer: 57 mL/min — ABNORMAL LOW (ref >60)
GFR calc non Af Amer: 47 mL/min — ABNORMAL LOW (ref >60)
GFR calc non Af Amer: 50 mL/min — ABNORMAL LOW (ref >60)
Glucose, Bld: 111 mg/dL — ABNORMAL HIGH (ref 70–99)
Glucose, Bld: 75 mg/dL (ref 70–99)
Potassium: 3.3 mEq/L — ABNORMAL LOW (ref 3.5–5.1)
Potassium: 3.5 mEq/L (ref 3.5–5.1)
Sodium: 143 mEq/L (ref 135–145)
Sodium: 143 mEq/L (ref 135–145)

## 2010-10-18 LAB — DIFFERENTIAL
Basophils Absolute: 0 10*3/uL (ref 0.0–0.1)
Eosinophils Relative: 1 % (ref 0–5)
Eosinophils Relative: 2 % (ref 0–5)
Lymphocytes Relative: 24 % (ref 12–46)
Lymphocytes Relative: 7 % — ABNORMAL LOW (ref 12–46)
Lymphs Abs: 1.1 10*3/uL (ref 0.7–4.0)
Monocytes Absolute: 0.8 10*3/uL (ref 0.1–1.0)
Monocytes Absolute: 1 10*3/uL (ref 0.1–1.0)
Monocytes Relative: 6 % (ref 3–12)

## 2010-10-18 LAB — COMPREHENSIVE METABOLIC PANEL
AST: 19 U/L (ref 0–37)
Albumin: 3.5 g/dL (ref 3.5–5.2)
Calcium: 9.3 mg/dL (ref 8.4–10.5)
Chloride: 104 mEq/L (ref 96–112)
Creatinine, Ser: 1.6 mg/dL — ABNORMAL HIGH (ref 0.4–1.5)
GFR calc Af Amer: 52 mL/min — ABNORMAL LOW (ref >60)
Sodium: 145 mEq/L (ref 135–145)

## 2010-10-18 LAB — CULTURE, BLOOD (ROUTINE X 2)
Culture  Setup Time: 201109161807
Culture: NO GROWTH

## 2010-10-18 LAB — URINALYSIS, ROUTINE W REFLEX MICROSCOPIC
Bilirubin Urine: NEGATIVE
Glucose, UA: NEGATIVE mg/dL
Hgb urine dipstick: NEGATIVE
Protein, ur: NEGATIVE mg/dL

## 2010-10-18 LAB — MRSA PCR SCREENING: MRSA by PCR: NEGATIVE

## 2010-10-22 LAB — BASIC METABOLIC PANEL
BUN: 27 mg/dL — ABNORMAL HIGH (ref 6–23)
BUN: 27 mg/dL — ABNORMAL HIGH (ref 6–23)
BUN: 30 mg/dL — ABNORMAL HIGH (ref 6–23)
BUN: 38 mg/dL — ABNORMAL HIGH (ref 6–23)
BUN: 41 mg/dL — ABNORMAL HIGH (ref 6–23)
CO2: 28 mEq/L (ref 19–32)
Calcium: 8.7 mg/dL (ref 8.4–10.5)
Calcium: 8.8 mg/dL (ref 8.4–10.5)
Calcium: 9.1 mg/dL (ref 8.4–10.5)
Creatinine, Ser: 1.46 mg/dL (ref 0.4–1.5)
GFR calc non Af Amer: 44 mL/min — ABNORMAL LOW (ref >60)
GFR calc non Af Amer: 44 mL/min — ABNORMAL LOW (ref >60)
GFR calc non Af Amer: 44 mL/min — ABNORMAL LOW (ref >60)
GFR calc non Af Amer: 48 mL/min — ABNORMAL LOW (ref >60)
Glucose, Bld: 129 mg/dL — ABNORMAL HIGH (ref 70–99)
Glucose, Bld: 146 mg/dL — ABNORMAL HIGH (ref 70–99)
Glucose, Bld: 149 mg/dL — ABNORMAL HIGH (ref 70–99)
Potassium: 2.8 mEq/L — ABNORMAL LOW (ref 3.5–5.1)
Potassium: 3.3 mEq/L — ABNORMAL LOW (ref 3.5–5.1)
Potassium: 3.7 mEq/L (ref 3.5–5.1)
Sodium: 140 mEq/L (ref 135–145)
Sodium: 141 mEq/L (ref 135–145)

## 2010-10-22 LAB — COMPREHENSIVE METABOLIC PANEL
CO2: 28 mEq/L (ref 19–32)
Calcium: 8.5 mg/dL (ref 8.4–10.5)
Creatinine, Ser: 2.08 mg/dL — ABNORMAL HIGH (ref 0.4–1.5)
GFR calc non Af Amer: 32 mL/min — ABNORMAL LOW (ref >60)
Glucose, Bld: 151 mg/dL — ABNORMAL HIGH (ref 70–99)

## 2010-10-22 LAB — CBC
Hemoglobin: 15.9 g/dL (ref 13.0–17.0)
MCHC: 32.9 g/dL (ref 30.0–36.0)
MCV: 95.7 fL (ref 78.0–100.0)
Platelets: 203 10*3/uL (ref 150–400)
RBC: 5.05 MIL/uL (ref 4.22–5.81)
WBC: 9.2 10*3/uL (ref 4.0–10.5)

## 2010-10-22 LAB — GLUCOSE, CAPILLARY
Glucose-Capillary: 108 mg/dL — ABNORMAL HIGH (ref 70–99)
Glucose-Capillary: 119 mg/dL — ABNORMAL HIGH (ref 70–99)
Glucose-Capillary: 127 mg/dL — ABNORMAL HIGH (ref 70–99)
Glucose-Capillary: 131 mg/dL — ABNORMAL HIGH (ref 70–99)
Glucose-Capillary: 142 mg/dL — ABNORMAL HIGH (ref 70–99)
Glucose-Capillary: 147 mg/dL — ABNORMAL HIGH (ref 70–99)
Glucose-Capillary: 148 mg/dL — ABNORMAL HIGH (ref 70–99)
Glucose-Capillary: 193 mg/dL — ABNORMAL HIGH (ref 70–99)
Glucose-Capillary: 196 mg/dL — ABNORMAL HIGH (ref 70–99)
Glucose-Capillary: 206 mg/dL — ABNORMAL HIGH (ref 70–99)
Glucose-Capillary: 213 mg/dL — ABNORMAL HIGH (ref 70–99)

## 2010-10-22 LAB — CULTURE, BLOOD (ROUTINE X 2): Culture: NO GROWTH

## 2010-10-22 LAB — WOUND CULTURE
Culture: NO GROWTH
Gram Stain: NONE SEEN

## 2010-11-05 ENCOUNTER — Ambulatory Visit: Payer: Medicare Other | Admitting: Surgery

## 2010-11-06 ENCOUNTER — Encounter (HOSPITAL_BASED_OUTPATIENT_CLINIC_OR_DEPARTMENT_OTHER): Payer: Medicare Other | Attending: Internal Medicine

## 2010-11-06 DIAGNOSIS — E1169 Type 2 diabetes mellitus with other specified complication: Secondary | ICD-10-CM | POA: Insufficient documentation

## 2010-11-06 DIAGNOSIS — B353 Tinea pedis: Secondary | ICD-10-CM | POA: Insufficient documentation

## 2010-11-06 DIAGNOSIS — L97809 Non-pressure chronic ulcer of other part of unspecified lower leg with unspecified severity: Secondary | ICD-10-CM | POA: Insufficient documentation

## 2010-11-06 DIAGNOSIS — L8992 Pressure ulcer of unspecified site, stage 2: Secondary | ICD-10-CM | POA: Insufficient documentation

## 2010-11-06 DIAGNOSIS — L89109 Pressure ulcer of unspecified part of back, unspecified stage: Secondary | ICD-10-CM | POA: Insufficient documentation

## 2010-11-12 ENCOUNTER — Ambulatory Visit (INDEPENDENT_AMBULATORY_CARE_PROVIDER_SITE_OTHER): Payer: Medicare Other | Admitting: Surgery

## 2010-11-12 DIAGNOSIS — I743 Embolism and thrombosis of arteries of the lower extremities: Secondary | ICD-10-CM

## 2010-11-13 LAB — CBC
HCT: 41.1 % (ref 39.0–52.0)
Hemoglobin: 13.6 g/dL (ref 13.0–17.0)
MCHC: 32.3 g/dL (ref 30.0–36.0)
MCHC: 32.7 g/dL (ref 30.0–36.0)
MCV: 92.5 fL (ref 78.0–100.0)
MCV: 93.2 fL (ref 78.0–100.0)
MCV: 93.6 fL (ref 78.0–100.0)
Platelets: 190 10*3/uL (ref 150–400)
Platelets: 193 10*3/uL (ref 150–400)
Platelets: 196 10*3/uL (ref 150–400)
Platelets: 249 10*3/uL (ref 150–400)
RBC: 4.29 MIL/uL (ref 4.22–5.81)
RBC: 4.39 MIL/uL (ref 4.22–5.81)
RBC: 4.74 MIL/uL (ref 4.22–5.81)
RDW: 18.5 % — ABNORMAL HIGH (ref 11.5–15.5)
WBC: 6.3 10*3/uL (ref 4.0–10.5)
WBC: 6.9 10*3/uL (ref 4.0–10.5)

## 2010-11-13 LAB — TSH: TSH: 1.411 u[IU]/mL (ref 0.350–4.500)

## 2010-11-13 LAB — COMPREHENSIVE METABOLIC PANEL
ALT: 11 U/L (ref 0–53)
AST: 10 U/L (ref 0–37)
Albumin: 2.7 g/dL — ABNORMAL LOW (ref 3.5–5.2)
Alkaline Phosphatase: 65 U/L (ref 39–117)
BUN: 19 mg/dL (ref 6–23)
CO2: 33 mEq/L — ABNORMAL HIGH (ref 19–32)
CO2: 38 mEq/L — ABNORMAL HIGH (ref 19–32)
Chloride: 100 mEq/L (ref 96–112)
Chloride: 104 mEq/L (ref 96–112)
Creatinine, Ser: 1.22 mg/dL (ref 0.4–1.5)
GFR calc Af Amer: >60 mL/min (ref >60)
GFR calc non Af Amer: 59 mL/min — ABNORMAL LOW (ref >60)
Potassium: 3.5 mEq/L (ref 3.5–5.1)
Sodium: 147 mEq/L — ABNORMAL HIGH (ref 135–145)
Total Bilirubin: 0.5 mg/dL (ref 0.3–1.2)
Total Bilirubin: 0.6 mg/dL (ref 0.3–1.2)

## 2010-11-13 LAB — POCT I-STAT, CHEM 8
Chloride: 104 mEq/L (ref 96–112)
Creatinine, Ser: 1.4 mg/dL (ref 0.4–1.5)
HCT: 47 % (ref 39.0–52.0)
Hemoglobin: 16 g/dL (ref 13.0–17.0)
Potassium: 3.8 mEq/L (ref 3.5–5.1)
Sodium: 142 mEq/L (ref 135–145)

## 2010-11-13 LAB — CULTURE, BLOOD (ROUTINE X 2)

## 2010-11-13 LAB — GLUCOSE, CAPILLARY
Glucose-Capillary: 100 mg/dL — ABNORMAL HIGH (ref 70–99)
Glucose-Capillary: 100 mg/dL — ABNORMAL HIGH (ref 70–99)
Glucose-Capillary: 122 mg/dL — ABNORMAL HIGH (ref 70–99)
Glucose-Capillary: 184 mg/dL — ABNORMAL HIGH (ref 70–99)
Glucose-Capillary: 92 mg/dL (ref 70–99)
Glucose-Capillary: 95 mg/dL (ref 70–99)

## 2010-11-13 LAB — LIPID PANEL
HDL: 28 mg/dL — ABNORMAL LOW (ref >39)
Total CHOL/HDL Ratio: 5.2 RATIO
Triglycerides: 175 mg/dL — ABNORMAL HIGH (ref <150)

## 2010-11-13 LAB — DIFFERENTIAL
Basophils Relative: 1 % (ref 0–1)
Eosinophils Absolute: 0.3 10*3/uL (ref 0.0–0.7)
Lymphs Abs: 2.5 10*3/uL (ref 0.7–4.0)
Monocytes Relative: 9 % (ref 3–12)
Neutro Abs: 3.5 10*3/uL (ref 1.7–7.7)
Neutrophils Relative %: 50 % (ref 43–77)

## 2010-11-13 LAB — HEMOGLOBIN A1C
Hgb A1c MFr Bld: 6.3 % — ABNORMAL HIGH (ref 4.6–6.1)
Mean Plasma Glucose: 134 mg/dL

## 2010-11-13 LAB — BASIC METABOLIC PANEL
BUN: 19 mg/dL (ref 6–23)
CO2: 37 mEq/L — ABNORMAL HIGH (ref 19–32)
Chloride: 104 mEq/L (ref 96–112)
Creatinine, Ser: 1.16 mg/dL (ref 0.4–1.5)

## 2010-11-13 NOTE — Assessment & Plan Note (Signed)
OFFICE VISIT  Nicholas, [name] D DOB:  April 12, 1941                                       11/12/2010 ZOXWR#:[id number]  REASON FOR VISIT:  Followup.  HISTORY:  The patient is a 70 year old gentleman whom I met in the hospital with a left leg ulcer.  He underwent atherectomy and angioplasty of his left popliteal artery and angioplasty of his left anterior tibial artery for limb salvage.  This was done on February 28. He comes back today for followup.  He recently fell 2 days ago.  He complains of lower extremity swelling.  He is not at the wound center because he seems to develop an infection every time he goes over there.  PHYSICAL EXAM:  Vital signs:  Heart rate 65, blood pressure 110/62, O2 sats 96%.  General:  He is resting comfortably in his wheelchair, in no acute distress.  Respirations:  Are nonlabored.  Cardiovascular:  He has massive lower extremity edema.  I cannot palpate pulses in his feet because of his edema.  The ulcer on the plantar aspect of his left foot is improving.  It is very superficial.  There is healthy granulation tissue at its base without evidence of infection.  PLAN:  I think we are making progress just by improving his blood flow. He did not get an ultrasound today but he appears to be doing well as far as his wound healing.  I think in order to facilitate the healing we need to get the edema out of his leg.  I think the best way to do that at this point is with an Unna boot on his left leg.  I am arranging home health to do this.  It needs to be changed 3 times a week.  Ultimately I am going to get him into compression stockings bilaterally.  I am going to have him come back to see me in 3-4 weeks for a wound evaluation.  I am also going to get a duplex ultrasound of his left leg at the intervention site.  We will also do venous insufficiency workup to see if he would be a candidate for laser ablation to help with his  swelling.    Jorge Ny, MD Electronically Signed  VWB/MEDQ  D:  11/12/2010  T:  11/13/2010  Job:  [id number]

## 2010-11-14 ENCOUNTER — Emergency Department (HOSPITAL_COMMUNITY): Payer: Medicare Other

## 2010-11-14 ENCOUNTER — Inpatient Hospital Stay (HOSPITAL_COMMUNITY)
Admission: EM | Admit: 2010-11-14 | Discharge: 2010-11-19 | DRG: 603 | Disposition: A | Discharge status: Home Health | Payer: Medicare Other | Attending: Internal Medicine | Admitting: Internal Medicine

## 2010-11-14 ENCOUNTER — Encounter (HOSPITAL_COMMUNITY): Payer: Self-pay | Admitting: Radiology

## 2010-11-14 DIAGNOSIS — L97809 Non-pressure chronic ulcer of other part of unspecified lower leg with unspecified severity: Secondary | ICD-10-CM | POA: Diagnosis present

## 2010-11-14 DIAGNOSIS — M6282 Rhabdomyolysis: Secondary | ICD-10-CM | POA: Diagnosis present

## 2010-11-14 DIAGNOSIS — I509 Heart failure, unspecified: Secondary | ICD-10-CM | POA: Diagnosis present

## 2010-11-14 DIAGNOSIS — I739 Peripheral vascular disease, unspecified: Secondary | ICD-10-CM | POA: Diagnosis present

## 2010-11-14 DIAGNOSIS — E669 Obesity, unspecified: Secondary | ICD-10-CM | POA: Diagnosis present

## 2010-11-14 DIAGNOSIS — I5032 Chronic diastolic (congestive) heart failure: Secondary | ICD-10-CM | POA: Diagnosis present

## 2010-11-14 DIAGNOSIS — I10 Essential (primary) hypertension: Secondary | ICD-10-CM | POA: Diagnosis present

## 2010-11-14 DIAGNOSIS — I2510 Atherosclerotic heart disease of native coronary artery without angina pectoris: Secondary | ICD-10-CM | POA: Diagnosis present

## 2010-11-14 DIAGNOSIS — E119 Type 2 diabetes mellitus without complications: Secondary | ICD-10-CM | POA: Diagnosis present

## 2010-11-14 DIAGNOSIS — L03119 Cellulitis of unspecified part of limb: Principal | ICD-10-CM | POA: Diagnosis present

## 2010-11-14 DIAGNOSIS — L89109 Pressure ulcer of unspecified part of back, unspecified stage: Secondary | ICD-10-CM | POA: Diagnosis present

## 2010-11-14 DIAGNOSIS — L8990 Pressure ulcer of unspecified site, unspecified stage: Secondary | ICD-10-CM | POA: Diagnosis present

## 2010-11-14 DIAGNOSIS — Z951 Presence of aortocoronary bypass graft: Secondary | ICD-10-CM

## 2010-11-14 DIAGNOSIS — IMO0002 Reserved for concepts with insufficient information to code with codable children: Secondary | ICD-10-CM | POA: Diagnosis present

## 2010-11-14 DIAGNOSIS — L02419 Cutaneous abscess of limb, unspecified: Principal | ICD-10-CM | POA: Diagnosis present

## 2010-11-14 DIAGNOSIS — L98499 Non-pressure chronic ulcer of skin of other sites with unspecified severity: Secondary | ICD-10-CM | POA: Diagnosis present

## 2010-11-14 DIAGNOSIS — M1710 Unilateral primary osteoarthritis, unspecified knee: Secondary | ICD-10-CM | POA: Diagnosis present

## 2010-11-14 HISTORY — DX: Essential (primary) hypertension: I10

## 2010-11-14 LAB — BASIC METABOLIC PANEL
BUN: 20 mg/dL (ref 6–23)
Calcium: 9 mg/dL (ref 8.4–10.5)
Creatinine, Ser: 1.25 mg/dL (ref 0.4–1.5)
GFR calc non Af Amer: 57 mL/min — ABNORMAL LOW (ref >60)
Glucose, Bld: 99 mg/dL (ref 70–99)
Sodium: 140 mEq/L (ref 135–145)

## 2010-11-14 LAB — DIFFERENTIAL
Basophils Absolute: 0 10*3/uL (ref 0.0–0.1)
Eosinophils Relative: 4 % (ref 0–5)
Lymphocytes Relative: 28 % (ref 12–46)
Monocytes Absolute: 0.7 10*3/uL (ref 0.1–1.0)

## 2010-11-14 LAB — URINALYSIS, ROUTINE W REFLEX MICROSCOPIC
Bilirubin Urine: NEGATIVE
Ketones, ur: NEGATIVE mg/dL
Nitrite: NEGATIVE
Protein, ur: NEGATIVE mg/dL
Specific Gravity, Urine: 1.009 (ref 1.005–1.030)
Urobilinogen, UA: 0.2 mg/dL (ref 0.0–1.0)

## 2010-11-14 LAB — CBC
HCT: 43.6 % (ref 39.0–52.0)
MCH: 30 pg (ref 26.0–34.0)
MCHC: 31.9 g/dL (ref 30.0–36.0)
RDW: 16 % — ABNORMAL HIGH (ref 11.5–15.5)

## 2010-11-15 ENCOUNTER — Inpatient Hospital Stay (HOSPITAL_COMMUNITY): Payer: Medicare Other

## 2010-11-15 DIAGNOSIS — L02419 Cutaneous abscess of limb, unspecified: Secondary | ICD-10-CM

## 2010-11-15 DIAGNOSIS — L03119 Cellulitis of unspecified part of limb: Secondary | ICD-10-CM

## 2010-11-15 LAB — CBC
Hemoglobin: 12.2 g/dL — ABNORMAL LOW (ref 13.0–17.0)
MCH: 29.1 pg (ref 26.0–34.0)
MCV: 93.6 fL (ref 78.0–100.0)
RBC: 4.19 MIL/uL — ABNORMAL LOW (ref 4.22–5.81)
WBC: 7.8 10*3/uL (ref 4.0–10.5)

## 2010-11-15 LAB — RAPID URINE DRUG SCREEN, HOSP PERFORMED
Amphetamines: NOT DETECTED
Barbiturates: NOT DETECTED
Benzodiazepines: POSITIVE — AB
Cocaine: NOT DETECTED
Opiates: POSITIVE — AB
Tetrahydrocannabinol: NOT DETECTED

## 2010-11-15 LAB — CLOSTRIDIUM DIFFICILE BY PCR: Toxigenic C. Difficile by PCR: NEGATIVE

## 2010-11-15 LAB — COMPREHENSIVE METABOLIC PANEL
ALT: 22 U/L (ref 0–53)
Albumin: 2.7 g/dL — ABNORMAL LOW (ref 3.5–5.2)
Alkaline Phosphatase: 64 U/L (ref 39–117)
BUN: 18 mg/dL (ref 6–23)
Chloride: 105 mEq/L (ref 96–112)
Glucose, Bld: 104 mg/dL — ABNORMAL HIGH (ref 70–99)
Potassium: 3.6 mEq/L (ref 3.5–5.1)
Sodium: 142 mEq/L (ref 135–145)
Total Bilirubin: 0.7 mg/dL (ref 0.3–1.2)
Total Protein: 5.4 g/dL — ABNORMAL LOW (ref 6.0–8.3)

## 2010-11-15 LAB — CARDIAC PANEL(CRET KIN+CKTOT+MB+TROPI)
Total CK: 1409 U/L — ABNORMAL HIGH (ref 7–232)
Total CK: 1157 U/L — ABNORMAL HIGH (ref 7–232)
Troponin I: 0.03 ng/mL (ref 0.00–0.06)

## 2010-11-15 LAB — GLUCOSE, CAPILLARY
Glucose-Capillary: 112 mg/dL — ABNORMAL HIGH (ref 70–99)
Glucose-Capillary: 110 mg/dL — ABNORMAL HIGH (ref 70–99)
Glucose-Capillary: 136 mg/dL — ABNORMAL HIGH (ref 70–99)

## 2010-11-15 LAB — VALPROIC ACID LEVEL: Valproic Acid Lvl: <10 ug/mL — ABNORMAL LOW (ref 50.0–100.0)

## 2010-11-16 LAB — BASIC METABOLIC PANEL
CO2: 31 mEq/L (ref 19–32)
GFR calc non Af Amer: >60 mL/min (ref >60)
Glucose, Bld: 141 mg/dL — ABNORMAL HIGH (ref 70–99)
Potassium: 3.5 mEq/L (ref 3.5–5.1)
Sodium: 140 mEq/L (ref 135–145)

## 2010-11-16 LAB — CBC
HCT: 37.7 % — ABNORMAL LOW (ref 39.0–52.0)
Hemoglobin: 12.1 g/dL — ABNORMAL LOW (ref 13.0–17.0)
RDW: 15.8 % — ABNORMAL HIGH (ref 11.5–15.5)
WBC: 5.9 10*3/uL (ref 4.0–10.5)

## 2010-11-16 LAB — GLUCOSE, CAPILLARY: Glucose-Capillary: 139 mg/dL — ABNORMAL HIGH (ref 70–99)

## 2010-11-17 DIAGNOSIS — E119 Type 2 diabetes mellitus without complications: Secondary | ICD-10-CM

## 2010-11-17 DIAGNOSIS — I739 Peripheral vascular disease, unspecified: Secondary | ICD-10-CM

## 2010-11-17 DIAGNOSIS — L02619 Cutaneous abscess of unspecified foot: Secondary | ICD-10-CM

## 2010-11-17 DIAGNOSIS — L03119 Cellulitis of unspecified part of limb: Secondary | ICD-10-CM

## 2010-11-17 DIAGNOSIS — L97409 Non-pressure chronic ulcer of unspecified heel and midfoot with unspecified severity: Secondary | ICD-10-CM

## 2010-11-17 LAB — CARDIAC PANEL(CRET KIN+CKTOT+MB+TROPI)
CK, MB: 2.9 ng/mL (ref 0.3–4.0)
Relative Index: 1.3 (ref 0.0–2.5)
Total CK: 227 U/L (ref 7–232)

## 2010-11-17 LAB — GLUCOSE, CAPILLARY: Glucose-Capillary: 138 mg/dL — ABNORMAL HIGH (ref 70–99)

## 2010-11-18 LAB — BASIC METABOLIC PANEL
BUN: 8 mg/dL (ref 6–23)
CO2: 30 mEq/L (ref 19–32)
Chloride: 102 mEq/L (ref 96–112)
Creatinine, Ser: 1.14 mg/dL (ref 0.4–1.5)
Glucose, Bld: 111 mg/dL — ABNORMAL HIGH (ref 70–99)
Potassium: 3.6 mEq/L (ref 3.5–5.1)

## 2010-11-18 LAB — CBC
HCT: 39.1 % (ref 39.0–52.0)
Hemoglobin: 12.6 g/dL — ABNORMAL LOW (ref 13.0–17.0)
MCH: 29.7 pg (ref 26.0–34.0)
MCV: 92.2 fL (ref 78.0–100.0)
RBC: 4.24 MIL/uL (ref 4.22–5.81)
WBC: 6.6 10*3/uL (ref 4.0–10.5)

## 2010-11-18 LAB — GLUCOSE, CAPILLARY: Glucose-Capillary: 139 mg/dL — ABNORMAL HIGH (ref 70–99)

## 2010-11-18 LAB — CK: Total CK: 112 U/L (ref 7–232)

## 2010-11-19 LAB — WOUND CULTURE: Gram Stain: NONE SEEN

## 2010-11-19 LAB — GLUCOSE, CAPILLARY

## 2010-11-21 LAB — CULTURE, BLOOD (ROUTINE X 2): Culture  Setup Time: 201204120408

## 2010-11-24 NOTE — Discharge Summary (Signed)
NAME:  Nicholas [name], Nicholas [name] NO.:  [id number]  MEDICAL RECORD NO.:  [id number]           PATIENT TYPE:  I  LOCATION:  3734                         FACILITY:  [facility]  PHYSICIAN:  Candyce Churn, M.D.DATE OF BIRTH:  January 06, 1941  DATE OF ADMISSION:  11/14/2010 DATE OF DISCHARGE:  11/19/2010                              DISCHARGE SUMMARY   DISCHARGE DIAGNOSES: 1. 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 2. Rhabdomyolysis, resolved. 3. Lower extremity edema improved. 4. History of coronary artery disease status post CABG. 5. History of hypertension. 6. Type 2 diabetes mellitus. 7. PVD. 8. Obstructive sleep apnea. 9. Congestive heart failure with diastolic dysfunction. 10.History of diverticulosis. 11.History of mild renal insufficiency. 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  Gait disorder secondary to above with     ambulation requiring walker at all times. 13.MRSA screen positive with nasal swab.  Used mupirocin b.i.d. as     outpatient.  CONSULTATIONS: 1. Vascular Surgery, Dr. Myra Gianotti. 2. Wound Care.  DISCHARGE MEDICATIONS: 1. Aspirin 81 mg daily. 2. Ciprofloxacin 500 mg b.i.d. for 5 days. 3. Mupirocin 2% ointment, 1 application nasally b.i.d. 4. Terazosin 5 mg p.o. at bedtime. 5. Allopurinol 300 mg daily. 6. Alprazolam 1 mg 1-2 tablets orally t.i.d. 7. Atenolol 50 mg b.i.d. 8. Combigan ophthalmic solution 1 drop in left eye b.i.d. 9. Divalproex DR 250 mg 1 tablet b.i.d. 10.Furosemide 80 mg 1-2 tablets p.o. q.a.m. 11.Glipizide XL 5 mg b.i.d. 12.Hydrocodone 7.5/750, 1 by mouth 4-5 times daily as needed. 13.Imdur 30 mg daily. 14.Potassium chloride 20 mEq 3 tablets t.i.d. 15.Lantus sliding scale as per prior to hospitalization. 16.Metolazone 5 mg orally every Monday and Friday. 17.Sublingual nitroglycerin 0.4 mg q.5 minutes p.r.n. 18.Omeprazole 20 mg b.i.d. 19.Patanol  ophthalmic 0.1%, 1 drop in both eyes b.i.d. 20.Plavix 75 mg daily. 21.Simvastatin 20 mg p.o. q.p.m. 22.Temazepam 30 mg at night as needed for insomnia. 23.Xalatan ophthalmic 0.05% 1 drop in both eyes b.i.d.  HOSPITAL COURSE:  Nicholas [name] is a very pleasant 70 year old male with a history of recurrent lower extremity cellulitis.  He recently had revascularization of the left lower extremity with angioplasty of the left anterior temporal artery and atherectomy and angioplasty of the left popliteal artery on September 24, 2010 per Dr. Durene Cal IV. Since that time he has had excellent healing of his left foot arch ulcer with just minimal amount of granulation tissue remaining with continued improvement.  The patient began developing increasing swelling and difficulty with falls and confusion for 2 days prior to admission.  When he presented to the [facility] Room on November 14, 2010, he had some nausea and poor appetite and increasing confusion.  He also had erythema and edema involving his left shin and left foot.  He was admitted to treat cellulitis with vancomycin and ciprofloxacin IV and legs were elevated and he had marked improvement over the last 4-5 days.  He was found to be MRSA positive on nasal swab and mupirocin was started for nasal treatment topically.  The patient remained afebrile and hospitalized  and white count on discharge was normal as below.  There is a question of whether or not to restart Unna boot therapy as an outpatient.  I will leave that to discretion of the wound care nurse who will be seeing him at home over the next 24-48 hours.  It may be that simple wet-to-dry dressings with gauze wraps will be plenty in terms of therapy.  Any increase in current edema which is 1+ should prompt the patient or his wife to contact me immediately.  Other medical problems while hospitalized were completely stable.  He did have increased CPK level on  admission with CK of 2200.  At the time of discharge, CK was normal at 112 on November 18, 2010.  Other discharge laboratories are as follows:  Culture of the wounds reveals some rare gram-negative rods.  Speciation is still pending.  CK on discharge was 112 as mentioned.  BMET on November 18, 2010 reveals sodium 140, potassium 3.6, chloride 102, bicarb 30, glucose 112, BUN 8, creatinine 1.14, calcium 8.7, and white count 6600, hemoglobin 12.6, platelet count 208,000.  Valproic acid level was less than 10 on November 15, 2010 and again MRSA PCR screening was positive from November 15, 2010 from nasal swab.  C. difficile on stool on November 15, 2010 was negative.  The patient will be discharged home in much improved condition.  I will plan to see him back in the office in 2-3 weeks and will have home health nursing to see him in 24 hours.  Again, any recurrence of erythema or increase in leg edema over the next 2-3 weeks, he should contact me immediately.  CONDITION ON DISCHARGE:  Much improved.     Candyce Churn, M.D.     RNG/MEDQ  D:  11/19/2010  T:  11/19/2010  Job:  [id number]  cc:   Jorge Ny, MD  Electronically Signed by Marden Noble M.D. on 11/24/2010 03:07:47 PM

## 2010-11-24 NOTE — Discharge Summary (Signed)
  NAME:  JARRAD, MCLEES NO.:  [id number]  MEDICAL RECORD NO.:  [id number]           PATIENT TYPE:  I  LOCATION:  3734                         FACILITY:  [facility]  PHYSICIAN:  Candyce Churn, M.D.DATE OF BIRTH:  November 16, 1940  DATE OF ADMISSION:  11/14/2010 DATE OF DISCHARGE:  11/19/2010                              DISCHARGE SUMMARY   ADDENDUM:  Time spent in discussing care with the patient, examining the patient, and performing discharge summary was 40 minutes.     Candyce Churn, M.D.     RNG/MEDQ  D:  11/19/2010  T:  11/19/2010  Job:  [id number]  Electronically Signed by Marden Noble M.D. on 11/24/2010 03:07:51 PM

## 2010-12-03 ENCOUNTER — Encounter (INDEPENDENT_AMBULATORY_CARE_PROVIDER_SITE_OTHER): Payer: Medicare Other

## 2010-12-03 ENCOUNTER — Ambulatory Visit (INDEPENDENT_AMBULATORY_CARE_PROVIDER_SITE_OTHER): Payer: Medicare Other | Admitting: Surgery

## 2010-12-03 DIAGNOSIS — Z48812 Encounter for surgical aftercare following surgery on the circulatory system: Secondary | ICD-10-CM

## 2010-12-03 DIAGNOSIS — L98499 Non-pressure chronic ulcer of skin of other sites with unspecified severity: Secondary | ICD-10-CM

## 2010-12-03 DIAGNOSIS — I739 Peripheral vascular disease, unspecified: Secondary | ICD-10-CM

## 2010-12-03 DIAGNOSIS — M7989 Other specified soft tissue disorders: Secondary | ICD-10-CM

## 2010-12-03 NOTE — Procedures (Unsigned)
LOWER EXTREMITY ARTERIAL DUPLEX  INDICATION:  Left lower extremity PTA.  HISTORY: Diabetes:  Yes. Cardiac:  CHF. Hypertension:  Yes. Smoking:  Previous. Previous Surgery:  Left popliteal and anterior tibial artery PTA on 10/02/2010.  SINGLE LEVEL ARTERIAL EXAM                         RIGHT                LEFT Brachial:               157                  160 Anterior tibial:        129                  125 Posterior tibial:       137                  134 Peroneal: Ankle/Brachial Index:   0.86                 0.84  LOWER EXTREMITY ARTERIAL DUPLEX EXAM  DUPLEX:  Monophasic Doppler waveforms noted in the left lower extremity arterial system with velocities of >200 cm/s noted in the left proximal profunda femoral, mid superficial femoral, proximal popliteal and tibial peroneal trunk arteries.  IMPRESSION: 1. Patent left popliteal and anterior tibial arteries with elevated     velocities noted, as described above. 2. The bilateral ankle brachial indices suggest mildly decreased     perfusion of the bilateral lower extremities, however, the ankle     pressures may be falsely elevated due to leg edema or vessel     calcification.  The bilateral toe brachial indices suggest     moderately decreased perfusion of the lower extremity digits with     right toe brachial index equals 0.54 and the left toe brachial     index equals 0.45.  ___________________________________________ V. Charlena Cross, MD  CH/MEDQ  D:  12/03/2010  T:  12/03/2010  Job:  [id number]

## 2010-12-10 NOTE — Assessment & Plan Note (Signed)
OFFICE VISIT  Nicholas [name], Nicholas [name] DOB:  Jul 20, 1941                                       12/03/2010 [id number]#:[id number]  The patient comes back today for followup.  His original dictation was lost.  He was coded as a level 3.  In summary, He is a 70 year old gentleman with left leg ulcer who has undergone atherectomy and angioplasty of his left popliteal artery and his left anterior tibial artery for limb salvage on February 28.  He has had significant issues with swelling.  I placed him in an Radio broadcast assistant.  He was evaluated with ultrasound today that shows significant reflux in his left lower extremity deep venous system.  There is also elevated velocities within the anterior tibial artery which was treated with angioplasty.  He has been able to heal his wounds.  His biggest issue has been swelling. However, since he does have elevated velocities in a previously treated area I think that this needs to be addressed.  I have scheduled him to undergo arteriogram on May 8.  He will continue his Unna boots for another month.  He is not going to be easy to get him into compression stockings.  His wounds have healed with Unna boots.    Jorge Ny, MD Electronically Signed  VWB/MEDQ  D:  12/10/2010  T:  12/10/2010  Job:  [id number]

## 2010-12-11 ENCOUNTER — Observation Stay (HOSPITAL_COMMUNITY)
Admission: RE | Admit: 2010-12-11 | Discharge: 2010-12-11 | Disposition: A | Discharge status: Home / Self Care | Payer: Medicare Other | Source: Ambulatory Visit | Attending: Surgery | Admitting: Surgery

## 2010-12-11 DIAGNOSIS — L98499 Non-pressure chronic ulcer of skin of other sites with unspecified severity: Principal | ICD-10-CM | POA: Insufficient documentation

## 2010-12-11 DIAGNOSIS — I739 Peripheral vascular disease, unspecified: Principal | ICD-10-CM | POA: Insufficient documentation

## 2010-12-11 DIAGNOSIS — L97509 Non-pressure chronic ulcer of other part of unspecified foot with unspecified severity: Secondary | ICD-10-CM | POA: Insufficient documentation

## 2010-12-11 LAB — POCT ACTIVATED CLOTTING TIME
Activated Clotting Time: 193 seconds
Activated Clotting Time: 140 seconds

## 2010-12-11 LAB — GLUCOSE, CAPILLARY
Glucose-Capillary: 82 mg/dL (ref 70–99)
Glucose-Capillary: 144 mg/dL — ABNORMAL HIGH (ref 70–99)

## 2010-12-11 LAB — POCT I-STAT, CHEM 8
BUN: 38 mg/dL — ABNORMAL HIGH (ref 6–23)
Calcium, Ion: 1.09 mmol/L — ABNORMAL LOW (ref 1.12–1.32)
Chloride: 103 mEq/L (ref 96–112)
Creatinine, Ser: 1.4 mg/dL (ref 0.4–1.5)
Sodium: 141 mEq/L (ref 135–145)

## 2010-12-11 NOTE — Procedures (Unsigned)
DUPLEX DEEP VENOUS EXAM - LOWER EXTREMITY  INDICATION:  Swelling.  HISTORY:  Edema:  Bilateral lower extremity edema. Trauma/Surgery:  Patient states had veins in his legs used for heart surgeries. Pain:  Complaint of right knee pain. PE:  No. Previous DVT:  No. Anticoagulants: Other:  DUPLEX EXAM:               CFV   SFV   PopV  PTV    GSV               R  L  R  L  R  L  R   L  R  L Thrombosis    o  o  o  o  o  o            o Spontaneous   +  +  +  +  +  +            + Phasic        +  +  +  +  +  +            + Augmentation  +  +  +  +  +  +            + Compressible  +  +  +  +  +  +            + Competent     +  o  +  o  +  o            +  Legend:  + - yes  o - no  p - partial  D - decreased  IMPRESSION: 1. No evidence of deep or superficial venous thrombosis noted in the     bilateral lower extremities, based on limited visualization.  The     bilateral calf veins and right greater saphenous vein were not     adequately visualized due to patient edema and patient body     habitus. 2. Reflux of >500 milliseconds noted in the left lower extremity deep     venous system.       _____________________________ V. Charlena Cross, MD  CH/MEDQ  D:  12/03/2010  T:  12/03/2010  Job:  [id number]

## 2010-12-16 NOTE — H&P (Signed)
NAME:  Nicholas [name], MCCAREY NO.:  [id number]  MEDICAL RECORD NO.:  [id number]           PATIENT TYPE:  E  LOCATION:  [facility]                         FACILITY:  [facility]  PHYSICIAN:  Eduard Clos, MDDATE OF BIRTH:  1941-07-02  DATE OF ADMISSION:  11/14/2010 DATE OF DISCHARGE:                             HISTORY & PHYSICAL   PRIMARY CARE PHYSICIAN:  Candyce Churn, M.D.  PRIMARY VASCULAR SURGEON: 1. Durene Cal IV, MD  CHIEF COMPLAINT:  Increasing swelling of the left lower extremity and erythema.  HISTORY OF PRESENTING ILLNESS:  A 70 year old male with known history of diabetes mellitus type 2, peripheral vascular disease, status post nephrectomy, and angioplasty of his left popliteal artery and angioplasty of his left anterior tibial artery for limb salvage on September 24, 2010, almost a month and a half ago, has been experiencing increasing swelling, difficulty to walk, falls, and confusion.  The patient's wife noted that he has had increased redness of the anterior shin of the left leg.  The patient also had some pain.  The patient did visit Dr. Myra Gianotti 2 days ago, but since his visit the wife states that the erythema has started and got to the worse.  There was no fever or chills.  No chest pain or shortness of breath.  He had some nausea and poor appetite.  The patient is not eating anything due to poor appetite. At times, he gets confused.  At this time, he is well oriented, following commands.  Denies any loss of consciousness or any new focal deficits.  The patient also has no abdominal pain.  He does have chronic nausea.  Denies any dysuria, discharge, or diarrhea.  At this time, the patient has been admitted for lower extremity edema with mild erythema concerning for cellulitis.  The patient also has increasing discharge from his sacral decubitus ulcer.  PAST MEDICAL HISTORY: 1. History of CAD status post CABG. 2. Hypertension. 3. Diabetes  mellitus type 2. 4. Chronic lower extremity edema. 5. Peripheral vascular disease. 6. History of OSA. 7. Extremity cellulitis. 8. Diastolic heart failure. 9. History of diverticulosis. 10.History of mild renal insurgency.  Past surgical history includes CABG and the patient has had recent atherectomy and angioplasty of his left popliteal artery and angioplasty of his left anterior tibial artery for limb salvage.  SOCIAL HISTORY:  The patient is married, lives with his wife.  He is a full code.  Does not smoke cigarette, drink alcohol, use illegal drugs.  MEDICATION PRIOR TO ADMISSION:  At this time, the patient's wife does not recall the whole medication list.  He was discharged on September 27, 2010, with Lasix 80 mg daily to increase it to twice daily if increased edema, Lantus 30 units at bedtime, but wife states he is not sure if he is taking that now with sliding scale, allopurinol 300 mg daily, alprazolam 1 mg twice daily p.r.n. for anxiety, Lumigan, timolol, and Xalatan eye drops, glipizide 5 mg b.i.d., hydrocodone 7.5/750 p.o. q.6 h. p.r.n. for pain, metolazone 5 mg daily, sublingual nitroglycerin p.r.n., Plavix 75 daily, Patanol eye drops, omeprazole 20 mg daily, simvastatin  20 mg daily, temazepam 30 mg nightly, terazosin 5 mg daily, simvastatin 80 mg daily, __________ 1 tablet p.o. b.i.d.  ALLERGIES:  No known drug allergies.  REVIEW OF SYSTEMS:  As per history of present illness, nothing else significant.  PHYSICAL EXAMINATION:  GENERAL:  The patient examined at bedside, not in acute distress. VITAL SIGNS:  Blood pressure is 120/50, pulse 60 per minute, temperature 98.5, respirations 18, O2 sat 100%. HEENT:  Anicteric.  No discharge from ears, eyes, nose, or mouth. CHEST:  Bilateral air entry present.  No rhonchi.  No crepitation. HEART:  S1 and S2 heard. ABDOMEN:  Soft, nontender.  Bowel sounds heard. CNS:  The patient is awake, oriented to time, place, and person.   He is able to move upper and lower extremities.  There is swelling of both lower extremities with some blebs.  There is an ulcer in his plantar aspect of the left foot.  There is also erythema in the anterior part of the shin of the left foot.  At this time, pulses are not easily palpable because of edema.  I do not see any acute ischemic changes.  There was also sacral decubitus ulcer which is dressed at this time.  Per the nurse, he is having some discharge.  LABORATORY DATA:  EKG shows normal sinus rhythm with heart rate around 63 beats per minute with nonspecific ST-T changes with incomplete bundle- branch block.  The EKG changes are comparable to the old EKG.  X-ray of his right knee shows severe tricompartmental degenerative joint disease, small right knee joint effusion.  Chest x-ray shows moderate cardiomegaly, cannot exclude mild pulmonary vascular congestion.  CBC, WBC 9.4, hemoglobin 13.9, hematocrit is 43, platelets 199,000. Metabolic panel, sodium 140, potassium 4.1, chloride 101, carbon dioxide 27, glucose 99, BUN 20, creatinine 1.2, calcium 9, BNP 94.  UA is negative.  ASSESSMENT: 1. Increasing swelling of the lower extremity with increasing erythema     over the left lower extremity concerning for developing cellulitis. 2. Possibly infected sacral decubitus ulcer. 3. Frequent falls. 4. Episodic confusion. 5. Recent left popliteal artery atherectomy and angioplasty, and     angioplasty of his left anterior tibial artery for limb salvage. 6. Left foot ulcer, probably diabetic and due to peripheral vascular     disease. 7. Severe peripheral vascular disease. 8. Diabetes mellitus type 2. 9. Obesity. 10.History of obstructive sleep apnea. 11.Hypertension. 12.History of coronary artery disease status post AV, presently has no     chest pain.  PLAN: 1. At this time, we will admit the patient to telemetry to rule out     any arrhythmia. 2. For his lower extremity  edema with increasing erythema of the left     anterior shin, it is concerning for developing diverticulitis.  The     patient will be placed on vanc and Cipro.  We will get the wound     cultures from sacral decubitus to make sure there is no infection.     We will get a wound consult.  I have already discussed with Dr.     Hart Rochester who is on-call for Dr. Myra Gianotti.  They are going to see the     patient in the morning. 3. For his increasing swelling of the lower extremity, we will     continue his Lasix and closely follow intake and output and     potassium. 4. For his diabetes mellitus type 2, at this time the patient has poor  oral intake.  The patient's wife states he hardly eats anything, so     I am going to keep the patient on CBG checks, and based on that we     will see if he needs to be on long-acting Lantus.  At this time, we     need to verify his home medications also. 5. Hypertension.  We will continue his atenolol. 6. The patient has frequent falls.  We need PT and OT consult.     Looking back to his old medications, he is on Depakote.  We will     check Depakote levels to see if it is very high. 7. We will get PT and OT consult, wound consult. 8. Most importantly, we need to verify his home medication. 9. Further recommendation as condition evolves.  The patient is a full     code.     Eduard Clos, MD     ANK/MEDQ  D:  11/14/2010  T:  11/14/2010  Job:  [id number]  cc:   Candyce Churn, M.D.  Electronically Signed by Midge Minium MD on 12/16/2010 08:08:27 AM

## 2010-12-17 NOTE — Op Note (Signed)
NAME:  Nicholas [name], Nicholas [name]              ACCOUNT NO.:  [id number]  MEDICAL RECORD NO.:  [id number]           PATIENT TYPE:  O  LOCATION:  6524                         FACILITY:  [facility]  PHYSICIAN:  Juleen China IV, MDDATE OF BIRTH:  01-29-1941  DATE OF PROCEDURE:  12/11/2010 DATE OF DISCHARGE:  12/11/2010                              OPERATIVE REPORT   PREOPERATIVE DIAGNOSIS:  Left leg ulcer.  POSTOPERATIVE DIAGNOSIS:  Left leg ulcer.  PROCEDURES PERFORMED: 1. Ultrasound-accessed right femoral artery. 2. Abdominal aortogram. 3. Left leg runoff. 4. Atherectomy of left anterior tibial artery. 5. Angioplasty of left anterior tibial artery. 6. Intraarterial administration of nitroglycerin.  INDICATIONS:  [name] is a 70 year old gentleman with a left foot ulcer who has previously undergone atherectomy of his left popliteal artery and angioplasty of his left anterior tibial artery.  Duplex ultrasound for surveillance was performed, which showed recurrence of the stenosis within his anterior tibial artery.  He comes in today for further evaluation.  PROCEDURE:  The patient was identified in the holding and taken to room #8, placed supine on the table.  Right groin was prepped and draped in usual fashion.  Time-out was called.  The right femoral artery was evaluated with ultrasound and found to be widely patent.  Lidocaine 1% was used for local anesthesia.  The digital ultrasound image was acquired.  The right common femoral artery was then accessed under ultrasound guidance with a micropuncture needle and 0.018 wire was then was advanced without resistance followed by micropuncture sheath.  A Bentson wire was then inserted followed by 5-French sheath and Omni flush catheter was advanced to the level of the L1, abdominal aortogram was obtained.  The Omniflush catheter and Bentson wire were used to cross the aortic bifurcation.  Catheter was placed in the left external iliac  artery and left leg runoff was performed.  FINDINGS:  The suprarenal abdominal aorta is not well visualized to the catheter location.  The left renal artery is visualized and widely patent.  Cath location precludes the full evaluation of the right renal artery.  The infrarenal abdominal aorta is widely patent.  Bilateral common, external and internal iliac arteries are widely patent.  Left lower extremity:  Irregularity is seen within the left common femoral artery; however, there was no stenosis.  The profunda femoral artery is patent with mild stenosis at its origin.  There was also luminal irregularity within the midsuperficial femoral artery without hemodynamic significance.  There is approximately 45% stenosis within the popliteal artery behind the knee, three-vessel runoff to the ankle. However, the peroneal and posterior tibial artery occluded the ankle. The anterior tibial is dominant vessel to the across the ankle.  There is a non-flow limiting dissection at the origin of the anterior tibial artery.  At this point, the decision was made to intervene.  Over a Bentson wire, a 7-French Ansel-1 sheath was placed.  The patient was heparinized.  Using a Spartacore wire and a 3 x 4 Fox SV balloon, the wire was advanced into the anterior tibial artery.  A wire exchange was performed, then a Viper wire was placed.  I then performed atherectomy of the anterior tibial artery using the Diamondback 1.25 device. Multiple passes were performed a slow medium and high-speed.  Once this was completed, 3 x 4 balloon was used to perform angioplasty of the atherectomized area.  The balloon was taken to 3 atmospheres and held out for 1 minute with each inflation.  The completion study was then performed, this revealed a non-flow limiting dissection within the anterior tibial artery as it goes through the interosseous membrane.  I re-ballooned this area taking the balloon to 4 atmospheres holding it  up for 2 minutes.  Completion study revealed widely patent anterior tibial artery.  There was a non-flow limiting dissection that did not respond to balloon angioplasty.  Runoff was similar to preintervention.  I did not feel that stenting of this area was warranted especially given its location as the artery crosses the interosseous membrane.  The patient has excellent blood flow out on this foot.  At this point in time, the decision was made to terminate the anesthesia.  Catheters and wires were removed.  The patient was taken to the holding area for sheath pull.  IMPRESSION:  Successful atherectomy and angioplasty of the left anterior tibial artery.  Dissection was visualized, it was still present following intervention, improved blood flow to the foot via the anterior tibial artery, which is a single-vessel runoff.     Jorge Ny, MD     VWB/MEDQ  D:  12/11/2010  T:  12/11/2010  Job:  [id number]  Electronically Signed by Arelia Longest IV MD on 12/17/2010 10:59:47 PM

## 2010-12-18 NOTE — Assessment & Plan Note (Signed)
[facility]   NAME:  Nicholas [name], Nicholas [name]              ACCOUNT NO.:  [id number]   MEDICAL RECORD NO.:  [id number]      DATE OF BIRTH:  Dec 06, 1940   PHYSICIAN:  Barry Dienes. Eloise Harman, M.D.     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  He has not had significant pain in the foot or fever or chills.   OBJECTIVE:  VITAL SIGNS:  Blood pressure 94/58, pulse 64, respirations  18, temperature 98.4.  The condition of the wound is as follows:  Wound #1:  It is located on the plantar aspect of the left medial  midfoot.  It is an ulcer with length 1.4 cm x 1.3 cm width x 0.2 cm  depth.  There was no undermining.  This is a Educational psychologist grade 2 diabetic  foot ulcer.  There was no significant eschar or necrotic debris.  The  wound base was red with red granulation tissue.  There was no exposed  bone, tendon, or muscle.  There was a mild foul odor with a small amount  of yellow-to-serosanguineous discharge.  The periwound integrity had  some surrounding macerated tissue and callus.   ASSESSMENT:  Slowly healing diabetic neuropathic foot ulcer.   PLAN:  A #15 scalpel was used to gently debride the callus and necrotic  tissue surrounding the ulcer.  Following this, a silver alginate  dressing was applied, covered by [employer] pad, then a total contact  cast.  He will be seen weekly for dressing changes, and in 2 weeks, he  should have a physician reevaluation.           ______________________________  Barry Dienes Eloise Harman, M.D.     DGP/MEDQ  D:  08/23/2008  T:  08/23/2008  Job:  [id number]

## 2010-12-18 NOTE — Assessment & Plan Note (Signed)
[facility]   NAME:  Nicholas [name], Nicholas [name]              ACCOUNT NO.:  [id number]   MEDICAL RECORD NO.:  [id number]      DATE OF BIRTH:  [birth year]   PHYSICIAN:  Barry Dienes. Eloise Harman, M.D.     VISIT DATE:                                   OFFICE VISIT   SUBJECTIVE:  The patient is a 70 year old man of [place] descent  who was here for reevaluation of a sacral decubitus ulcer with a plan  for applying Medical Maggots, biological debridement, and for  reevaluation of the left foot, neuropathic ulcer.   OBJECTIVE:  VITAL SIGNS:  Blood pressure 131/69, pulse 66, respirations  16, temperature 98.2, most recent capillary blood glucose level was 106.  EXTREMITIES:  The condition of the wounds is as follows:  Wound #5 is located on the medial left midfoot on the plantar aspect and  measures 0.5 cm x 0.3 cm x 0.1 cm.  This wound does not have significant  eschar or necrotic debris.  The wound base is red in color with red  granulation tissue.  There was no exposed bone, tendon, or muscle and no  foul odor.  There was scant serous drainage and an intact periwound  integrity.  Wound #4:  This is located overlying the sacrum and measures 0.5 cm x  0.8 cm x 0.2 cm.  This wound does not have significant eschar but has  minimal necrotic debris with a red wound base and red granulation  tissue.  There is no exposed bone, tendon, or muscle and mild odor to  the wound.  There was scant serous drainage and some maceration of the  tissue around the ulcer.   ASSESSMENT:  Stable appearance of left plantar foot, neuropathic ulcer.  No significant interval improvement in decubitus ulcer in the sacral  region with necrotic tissue that is difficult to fully debride.   PLAN:  After informed consent was obtained approximately 800 fly larvae  were sealed into the sacral decubitus ulcer.  Over the mesh, was applied  a bulky gauze dressing.  To left foot ulcer, Allevyn pad covered by  Profore wrap was applied.  It was advised that his wife could change the  bulky dressing overlying the sacrum if necessary.  He should return in 2  days for removal of the Medical Maggots.  She was advised that if any  maggots should escape that she could destroy them via soaking them in  rubbing alcohol.           ______________________________  Barry Dienes. Eloise Harman, M.D.     DGP/MEDQ  D:  01/24/2009  T:  01/25/2009  Job:  [id number]

## 2010-12-18 NOTE — Assessment & Plan Note (Signed)
[facility]   NAME:  JHAN, CONERY              ACCOUNT NO.:  [id number]   MEDICAL RECORD NO.:  [id number]      DATE OF BIRTH:  1941/07/13   PHYSICIAN:  Barry Dienes. Eloise Harman, M.D.     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  His cast is removed once weekly with doctor visits every 2 weeks.  He  has not had significant pain in the foot or fever or chills.   PAST MEDICAL HISTORY:  Otherwise, significant for obstructive sleep  apnea which has left him sleepy much of the time.   OBJECTIVE:  VITAL SIGNS:  Stable and he was afebrile.   The condition of the wound is as follows:  Wound #1:  It is located at the medial left mid foot on the plantar  aspect.  There is no ulceration.  There is a moderate amount of callus  buildup in the area.   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  Given the misshapen nature of his left foot, he will need a  custom orthotic to prevent subsequent breakdown of his foot.   PLAN:  A #15 scalpel blade was used to debride the callus from the area  of the ulcer.  This was done without significant pain or bleeding.  Following this, a total contact cast was reapplied by the orthopedic  technician without difficulty.  [employer] has been contacted and will come  to his next visit in 1 week, at which time his total contact cast will  be removed and a set of custom imprints of his foot will be obtained.  It is anticipated that a total contact cast will then be reapplied while  his inserts are being made.           ______________________________  Barry Dienes Eloise Harman, M.D.     DGP/MEDQ  D:  10/04/2008  T:  10/05/2008  Job:  [id number]

## 2010-12-18 NOTE — Assessment & Plan Note (Signed)
[facility]   NAME:  Nicholas [name], Nicholas [name] NO.:  [id number]   MEDICAL RECORD NO.:  [id number]      DATE OF BIRTH:  Nov 24, 1940   PHYSICIAN:  Joanne Gavel, M.D.         VISIT DATE:  11/30/2008                                   OFFICE VISIT   HISTORY OF PRESENT ILLNESS:  This 70 year old man with a diabetic foot  neuropathic ulcer, which was debrided yesterday, returns today for cast  removal and recheck.   PHYSICAL EXAMINATION:  The patient is afebrile.  Vital signs are stable.  The patient's foot is markedly swollen, not tender or painful, and there  is redness, which is not fixed.   IMPRESSION:  Perhaps the patient has this grade 3 swelling because he  has been within his leg dependent or perhaps we are looking at an early  cellulitis.   PLAN:  Keep cast off.  Redress for wounds with silver alginate gauze and  Kerlix.  Start Keflex 500 q.i.d.  Return tomorrow to see if there is any  progress with this redness and swelling.      Joanne Gavel, M.D.  Electronically Signed     RA/MEDQ  D:  11/30/2008  T:  11/30/2008  Job:  [id number]

## 2010-12-18 NOTE — Assessment & Plan Note (Signed)
[facility]   NAME:  Nicholas [name], Nicholas [name]              ACCOUNT NO.:  [id number]   MEDICAL RECORD NO.:  [id number]      DATE OF BIRTH:  Nov 14, 1940   PHYSICIAN:  Barry Dienes. Eloise Harman, M.D. VISIT DATE:  11/15/2008                                   OFFICE VISIT   SUBJECTIVE:  The patient is a 70 year old man of [place] descent  who had been treated in this clinic for a right foot diabetic  neuropathic ulcer with total contact casting.  He had also been followed  for a sacral decubitus ulcer.  Approximately 2 weeks ago, he resumed  using his custom extra depth shoes on drainage overlying the site of a  new ulcer on the right midfoot.  He has been treating the sacral  decubitus ulcer with SilvaSorb gel covered by gauze once daily as  applied by his wife.  He has not had fever or chills.   OBJECTIVE:  VITAL SIGNS:  Blood pressure 148/63, pulse 51, respirations  19, temperature 97.8, and most recent capillary blood glucose level was  151.   The condition of the ulcers is as follows:  Wound #4:  Overlies the sacrum and measures 0.9 x 0.6 x 0.2 cm.  This  wound does not have significant eschar and has minimal necrotic debris  within it.  The wound base is pale, pink with pink granulation tissue.  There was no exposed bone, tendon, or muscle and no foul odor.  There  was a scant amount of serosanguineous discharge and moderate maceration  around the wound.  Wound #1A:  Around the wound but none within it.  The wound base was  pale, pink in color with pink granulation tissue.  There was no exposed  bone, tendon, or muscle and a small amount of serosanguineous drainage.   ASSESSMENT:  1. The sacral decubitus ulcer has moderate amount of drainage, causing      maceration.  There has been interval slight worsening of this      lesion.  The recurrent left foot neuropathic ulcer is due to his      foot deformity despite efforts to more evenly distribute his weight      with  custom inserts and custom shoes.  He may be a good candidate      for foot surgery to attempt to normalize the shape of the plantar      aspect of his foot.  For now, he needs total contact cast to      offload the neuropathic ulcer.   TREATMENT AND PLAN:  After 5% lidocaine ointment was applied to the  sacral decubitus ulcer, a #15 scalpel blade was used to debride the dead  macerated tissue surrounding the ulcer.  There was no significant  necrotic debris around the left foot ulcer, so no debridement was done  there.   We are applying barrier cream around the sacral decubitus ulcer and  continuing treatment with SilvaSorb gel covered by gauze once daily.  A  total contact cast was applied to the left foot neuropathic ulcer after  application of silver alginate dressing with Lyofoam.  We will plan on  reevaluating his wound in 1 week.  In the interim, I will contact his  orthopedic physician,  Dr. Marcene Corning, to see what his thoughts  regarding possible foot surgery.           ______________________________  Barry Dienes Eloise Harman, M.D.     DGP/MEDQ  D:  11/15/2008  T:  11/16/2008  Job:  [id number]   cc:   Lubertha Basque. Jerl Santos, M.D.

## 2010-12-18 NOTE — Assessment & Plan Note (Signed)
[facility]   NAME:  Nicholas [name], Nicholas [name] NO.:  [id number]   MEDICAL RECORD NO.:  [id number]      DATE OF BIRTH:  11-20-1940   PHYSICIAN:  Maxwell Caul, M.D. VISIT DATE:  11/24/2008                                   OFFICE VISIT   Mr. Square is a gentleman we have been following for a right foot  diabetic neuropathic ulcer with total contact casting.  This was last  applied on November 22, 2008.  He comes in today complaining of pain in the  left anterior foot at the ankle region.  The pain made it difficult for  him to sleep last night and he came in for our review.  The total  contact cast was removed.  The wound which is in the middle of a Charcot  deformity on the left foot actually looked fairly good.  There is  certainly no evidence of surrounding infection here.  In the area where  he actually felt the pain on the anterior ankle, there were no  abrasions.  He did have more swelling in the foot and we were used to  seeing in the clinic and I wonder whether this might have been the  problem here.   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   We continued with the Allevyn to the actual wound.  He did not have the  ability to reapply the cast; therefore, we wrapped this in an Ace wrap  and put him in a CAM Walker.  We will have him back on Tuesday for  reapplication of the total contact cast.  He is not all that ambulatory.  Therefore, I was not too concerned about pressure problems over the area  on his foot.           ______________________________  Maxwell Caul, M.D.     MGR/MEDQ  D:  11/24/2008  T:  11/25/2008  Job:  [id number]

## 2010-12-18 NOTE — H&P (Signed)
NAME:  Nicholas [name], Nicholas [name] NO.:  [id number]   MEDICAL RECORD NO.:  [id number]          PATIENT TYPE:  INP   LOCATION:  1826                         FACILITY:  [facility]   PHYSICIAN:  Ramiro Harvest, MD    DATE OF BIRTH:  Jun 04, 1941   DATE OF ADMISSION:  06/22/2007  DATE OF DISCHARGE:                              HISTORY & PHYSICAL   PRIMARY CARE PHYSICIAN:  Dr. Johnella Moloney.   HISTORY OF PRESENT ILLNESS:  Nicholas [name] is a 70 year old white  gentleman with multiple medical problems, including COPD, type 2  diabetes with retinopathy, hypertension, coronary artery disease status  post CABG x2 and sacral decubitus who presents to the ED with a 2 week  history of generalized weakness.  Over the past 2 days, the patient has  had some foul smelling urine, subjective fevers, positive diarrhea,  polyuria and just worsening of his global weakness.  Per wife, the  patient was confused on the morning of admission, his legs gave out from  under him and he could not get up and the patient was brought to the ED.  On EMS arrival, at the patient's home, the patient had a systolic blood  pressure of 78, CVGs of 192 and the patient was brought in to the ED.  In the ED, the patient was found to have an elevated white count, urine  suspicious for a urinary tract infection and was hypotensive.  The  patient was hydrated with IV fluids in the ED and given a shot of  Rocephin.  The wife and family deny any chest pain, no palpitations, no  syncope, no change in patient's chronic shortness of breath.  No nausea,  no vomiting, no melena, no hematochezia, no hematemesis, no syncope and  no other associated symptoms.  We were called to admit the patient to  the ED for further evaluation and management.   ALLERGIES:  NO KNOWN DRUG ALLERGIES.   PAST MEDICAL HISTORY:  1. Chronic obstructive pulmonary disease on home O2.  2. Hypertension.  3. Type 2 diabetes, insulin-dependent with  retinopathy.  4. Coronary artery disease, status post coronary artery bypass graft      x2.  5. History of congestive heart failure.  6. 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  7. Glaucoma.  8. Gout.  9. Hyperlipidemia.  10.Obesity.  11.Benign prostatic hypertrophy.  12.History of cerebrovascular accident.  13.History of decubitus on the sacrum, which is chronic in nature.  14.Gastroesophageal reflux disease.  15.History of bilateral lower extremity edema.  16.Obstructive sleep apnea.  17.Depression, psychotic features.  18.Anxiety.  19.History of peptic ulcer disease.  20.Obesity, hypoventilation syndrome.  21.History of right septic atherosis of the knee secondary to      methicillin-resistant Staphylococcus aureus after arthroscopy.  22.Questionable schizophrenia.  23.Questionable insomnia.   HOME MEDICATIONS:  1. Plavix 75 mg daily.  2. Glucotrol XL 5 mg daily.  3. Lantus 40 units q.h.s.  4. Flomax 0.4 mg b.i.d.  5. Cartia XT 240 mg daily.  6. Nexium 40 mg daily.  7. Allopurinol 300 mg daily.  8. Atenolol 50 mg b.i.d.  9. Imdur 60 mg 1/2 tablet daily.  10.Simvastatin 20 mg daily.  11.Lasix 80 mg in the morning, 40 mg at night.  12.Metallizone 5 mg Monday, Wednesday and Friday.  13.KCL 40 mEq in the morning, 60 mEq in the afternoon, 40 mEq at      bedtime.  14.Depakote 250 mg q.h.s.  15.Perphenazine 8 mg 2 tablets q.h.s.  16.Temazepam 30 mg q.h.s.  17.Alprazolam 1 mg in the morning and 2 mg at bedtime.  18.Vicodin 5/500 p.o. q.i.d.  19.Xalatan eye drops 0.005% in each eye daily.  20.Combigan to the left eye twice a day.  21.Patanol 0.1% one drop in each eye twice daily.  22.Home oxygen.   PAST SURGICAL HISTORY:  1. Status post a coronary artery bypass graft x2.  2. Right knee arthroscopy.  3. Lumbar spine surgery x3.  4. Status post appendectomy.  5. Status post cholecystectomy.  6. Status post laser surgery for glaucoma and diabetic retinopathy.    SOCIAL HISTORY:  The patient lives in [place] with his wife.  The  patient ambulates at home with the use of a walker.  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  No alcohol abuse.  No IV  drug use.   FAMILY HISTORY:  The patient's mother deceased at age 70 from an acute  MI.  Father deceased at age 49 from an MI and a CVA.  The patient has 5  brothers, they all have acute MI and all are status post a CABG.  The  patient has 4 sisters who are all healthy.   REVIEW OF SYSTEMS:  As stated per HPI.   PHYSICAL EXAMINATION:  VITAL SIGNS:  Temperature 99.5.  Blood pressure  107/61 to 93/51 to 107/58.  Pulse of 88.  Respiratory rate 20.  Saturating 94% on 4 liters of nasal cannula.  GENERAL:  The patient is sleepy and drowsy, but arousable to verbal  stimuli and following commands.  HEENT:  Normocephalic, atraumatic.  Pupils equal, round and reactive to  light.  Extraocular movements intact.  Oropharynx is dry.  No lesions.  NECK:  Supple.  No lymphadenopathy.  RESPIRATORY:  Lungs clear to auscultation bilaterally in the anterior  lung fields.  CARDIOVASCULAR:  Regular rate and rhythm.  No murmurs, rubs or gallops.  ABDOMEN:  Soft, mildly distended, nontender.  Positive bowel sounds.  EXTREMITIES:  Two to 3+ bilateral lower extremity edema bilaterally.  NEUROLOGICAL EXAM:  The patient is drowsy, but opens his eyes to verbal  stimuli. He is oriented x3, following commands.  Cranial nerves II-XII  are grossly intact.  No focal deficits.  Sensation is intact.  Cerebellum in the left upper extremity with some dysmetria, but however  normal in the right upper extremity.  Strength is 3/5 in the bilateral  upper extremities.  They are 3/5 in the left lower extremity.  A 2/5 in  the left lower extremity.  Reflexes diffusely.  Gait was not tested  secondary to safety.   LABORATORY DATA:  White count  15.9, hemoglobin 14.9, platelets 230.  Sodium 141, potassium 2.9, chloride 100, bicarb 29, BUN 27, creatinine  1.51, glucose of 189, calcium is 8.9, bilirubin 1.2, alkaline  phosphatase of 37.2, AST 15, ALT 13, protein 6, albumin 3, lipase is 20.  Urinalysis is yellow, cloudy, specific gravity of 1.028, pH of 5.5,  glucose negative, bilirubins small, ketones 15, blood  large, protein 30,  urobilinogen 1.0, nitrites negative, leukocytes large, white blood cells  are too numerous to count, red blood cells 11 through 20 and some  bacteria.   ASSESSMENT/PLAN:  Mr. Laurence Crofford is a 70 year old gentleman with  multiple medical problems, including poorly controlled type 2 diabetes,  chronic obstructive pulmonary disease, hypertension and coronary artery  disease who presents to the emergency department with a worsening of  generalized weakness, subjective fevers and altered mental  status/confusion.   1. Early sepsis.  We will admit the patient into the intensive care      unit.  We will pan culture the patient.  We will cycle cardiac      enzymes and check a chest x-ray.  We will check an EKG.  We will      check lactic acid levels.  We will treat patient empirically with      vancomycin and Zosyn until culture results return.  We will also      hydrate patient gently and follow closely to prevent volume      overload as the patient does have a history of coronary artery      disease and we will also start sepsis protocol.  2. Altered mental status/confusion.  Question of whether differential      includes a cerebrovascular accident versus an infectious etiology      versus a cardiac etiology versus a pulmonary etiology.  The patient      is currently oriented x3.  He is following commands appropriately.      We will check a head CT to rule out a cerebrovascular accident.  We      will pan culture the patient. We will cycle cardiac enzymes q.8      hours x3.  We will check an EKG.  We will  continue therapy with IV      antibiotics until culture results return and then we will follow      closely.  We will also want to check an ABG to follow patient's CO2      level as well.  3. Acute renal failure, likely secondary to dehydration and      hypotension secondary to patient's early sepsis.  We will check a      urinalysis.  We will check a urine sodium and a urine creatinine to      calculate FENNA.  Hydrate the patient gently and follow closely.  4. Urinary tract infection.  The patient on empiric antibiotics.  We      will await culture results before tailoring antibiotics.  5. Hypotension.  Hold blood pressure medications for now secondary to      problem number 1.  6. Coronary artery disease, status post coronary artery bypass graft      x2.  The patient is currently asymptomatic; however, we will go      ahead and cycle cardiac enzymes q.8 hours x3.  We will check an      EKG.  We will continue the patient's Plavix and simvastatin.  We      will hold patient's antidiuretics.  We will monitor.  7. Depression with psychotic features.  Stable.  We will continue      Depakote 250 mg q.h.s.  8. Hyperlipidemia.  Check a fasting lipid panel.  Continue on      simvastatin.  9. Type 2 diabetes.  Check a hemoglobin A1c.  Lantus 40 units subcu  q.h.s. and we will do the A1c hyperglycemic protocol.  10.Glaucoma.  Status post laser surgery.  Continue with eye drops.  11.Chronic obstructive pulmonary disease.  Stable.  Continue O2.      Albuterol and Atrovent nebulizers as needed.  12.Prophylaxis.  Protonix for gastrointestinal prophylaxis and Lovenox      for deep venous thrombosis prophylaxis.   It has been a pleasure taking care of Mr. Berry Godsey.      Ramiro Harvest, MD  Electronically Signed     DT/MEDQ  D:  06/22/2007  T:  06/22/2007  Job:  [id number]   cc:   Candyce Churn, M.D.

## 2010-12-18 NOTE — Assessment & Plan Note (Signed)
[facility]   NAME:  Nicholas [name], Nicholas [name]              ACCOUNT NO.:  [id number]   MEDICAL RECORD NO.:  [id number]      DATE OF BIRTH:  Jun 21, 1941   PHYSICIAN:  Barry Dienes. Eloise Harman, M.D.     VISIT DATE:                                   OFFICE VISIT   SUBJECTIVE:  The patient is a 70 year old man who has been treated for a  left foot neuropathic ulcer with total contact casting.  He has not had  significant pain in the foot or fever or chills.   OBJECTIVE:  VITAL SIGNS:  Blood pressure 136/73, pulse 67, respirations  20, temperature 98.5, most recent capillary blood glucose level was 111.  The condition of the ulcer is as follows:  Wound #2:  It is located on the medial plantar aspect of the left  forefoot.  It measures 2.1 cm x 2.1 cm x 0.2 cm.  He has a Wagner I  wound that has no significant eschar.  It has minimal necrotic debris  with a red wound base and red granulation tissue.  There is no exposed  bone, tendon, or muscle.  There is no foul odor and a large amount of  serosanguineous discharge.  There was minimal maceration of the tissue  around the ulcer.  There was also minimal callus buildup around the  ulcer that was improved since his last visit.   ASSESSMENT:  Slowly healing neuropathic ulcer from diabetes mellitus  affecting the left foot.  The total contact casting is helping his wound  to heal.   PLAN:  As his feet are insensate, he did not require topical anesthesia.  A #15 scalpel blade was used to selectively debride the callus around  the ulcer.  This was tolerated well with no significant pain or  bleeding.  Following this, the ulcer was covered with silver alginate,  then an X-trasorb pad, then a total contact cast was applied.  He should  have a nurse visit with total contact cast change in 1 week and a  physician reevaluation in approximately 2 weeks.           ______________________________  Barry Dienes Eloise Harman, M.D.     DGP/MEDQ  D:   08/09/2008  T:  08/09/2008  Job:  [id number]

## 2010-12-18 NOTE — Assessment & Plan Note (Signed)
[facility]   NAME:  Nicholas [name], Nicholas [name]              ACCOUNT NO.:  [id number]   MEDICAL RECORD NO.:  [id number]      DATE OF BIRTH:  12-May-1941   PHYSICIAN:  Barry Dienes. Eloise Harman, M.D.     VISIT DATE:                                   OFFICE VISIT   SUBJECTIVE:  The patient is a 70 year old man with diabetes mellitus,  type 2, who is seen for reevaluation of a sacrum decubitus ulcer.  He  had recently been treated with Medical Maggots debridement that was  quite successful resulting in near resolution of all necrotic tissue.  He has not had significant pain or fever or chills in the sacrum area.   OBJECTIVE:  VITAL SIGNS:  Blood pressure 146/78, pulse 60, respirations  18, temperature 97.6.   The condition of the wounds is as follows:  Wound #4:  This is located overlying the sacrum and measures 0.5 cm x  1.2 cm x 0.1 cm.  This wound does not have significant eschar and has  minimal necrotic debris.  The wound base is red in color with some red  granulation tissue.  There was no exposed bone, tendon, or muscle, and  no foul odor.  There was scant drainage with some maceration in the  surrounding tissue.  There was no foul odor.   ASSESSMENT:  Interval improvement in depth and exudates from sacral  decubitus ulcer.  I feel that he would do quite well if we could more  fully debride the macerated tissue.  Before applying the Medical Maggots  treatment, his wounds were deeper and very difficult to fully evaluate.  The wound appears to be healing by secondary intention.   PLAN:  After 5% lidocaine ointment was applied to the wound, a #15-  scalpel blade was used to partially debride the necrotic, macerated  tissue from surrounding the ulcer.  After this, the hydrogel with  Promogran covered by gauze was applied to the sacral decubitus ulcer.  We will continue this treatment at home once daily.  Next week, we will  again use Medical Maggots treatment to more fully  debride the necrotic  tissue and speed the eventual healing of his wounds entirely.           ______________________________  Barry Dienes Eloise Harman, M.D.     DGP/MEDQ  D:  02/07/2009  T:  02/08/2009  Job:  [id number]

## 2010-12-18 NOTE — Assessment & Plan Note (Signed)
[facility]   NAME:  [name], [name]              ACCOUNT NO.:  [id number]   MEDICAL RECORD NO.:  [id number]      DATE OF BIRTH:  Jul 07, 1941   PHYSICIAN:  Leonie Man, M.D.    VISIT DATE:  01/31/2009                                   OFFICE VISIT   PROBLEMS:  1. Pressure ulcer of sacrum.  2. 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   [name] is a 70 year old diabetic man treated severally for both  the pressure ulcers of the left foot and of the sacrum.  The patient  returns for follow-up on his lesions today.   The sacral wound measures 0.6 x 1.0 x 0.3 cm in this gentleman who is  afebrile with stable vital signs on today's evaluation, temperature  98.4, pulse 56, respirations 18, blood pressure is 126/71, capillary  blood glucose is 105.  The sacral wound is now almost fully scarred in  with the remaining wound at 0.5 x 1.2 cm.  There is no drainage, no  exudate, and the base of the wound is 100% granulated with some evidence  of continued advancement of epithelialization.   The left plantar wound is now completely closed.  There is a fairly  thick callus over the wound, but this does not require any debridement  at this time.   TREATMENT:  The sacral wound will be treated with Promogran and  hydrogel, change every 3 days by his spouse.  The foot wound, now we  will go ahead and clear that has been healed.  He will return to his  diabetic shoe which has offloading areas for the pressure on his foot.  I have asked his wife to examine his foot on a daily basis to make sure  that the foot is continuing to remain closed.  We will follow up with  Mr. Loden in 1 week to monitor the progress of his sacral wound.      Leonie Man, M.D.  Electronically Signed     PB/MEDQ  D:  01/31/2009  T:  02/01/2009  Job:  [id number]

## 2010-12-18 NOTE — Assessment & Plan Note (Signed)
[facility]   NAME:  Nicholas [name], Nicholas [name]              ACCOUNT NO.:  [id number]   MEDICAL RECORD NO.:  [id number]      DATE OF BIRTH:  1941/02/11   PHYSICIAN:  Barry Dienes. Eloise Harman, M.D. VISIT DATE:  11/29/2008                                   OFFICE VISIT   SUBJECTIVE:  The patient is a 70 year old man of [place] descent  who is seen for reevaluation of a left foot diabetic neuropathic ulcer  and a sacral decubitus ulcer.  He had pain with his last total contact  cast so that it had to be removed on November 24, 2008, and he switched to  a cam walker at that time.  Since then, his leg has not been hurting him  and has not had fever or chills.  He has been making his best efforts to  try to keep pressure off of the sacrum.   OBJECTIVE:  VITAL SIGNS:  Blood pressure 112/64, pulse 63, respirations  20, temperature 98.3, most recent capillary blood glucose level was 134.   The condition of the wounds is as follows:  On the left foot on the mid  plantar aspect is an ulcer that measures 1.2 cm x 1.5 cm x 0.1 cm.  This  ulcer does not have significant eschar or necrotic tissue.  He has a red  wound base with a red granulation tissue and no exposed bone, tendon, or  muscle or foul odor.  There is a small amount of serous drainage and  callus surrounding the wound.  Wound #4:  It is located overlying the sacrum.  It measures 0.6 cm x 0.9  cm x 0.2 cm.  This wound does not have significant eschar or necrotic  debris.  The wound base is red in color with red granulation tissue and  no exposed bone, tendon, or muscle or foul odor.  There is a moderate  amount of serous drainage and maceration around the wound.   ASSESSMENT:  Interval improved appearance of the left foot neuropathic  ulcer that recurred even with the use of custom inserts and custom  diabetic shoes.  I discussed his case with Dr. Jerl Santos recently.  He  felt that extensive foot reconstruction surgery would  likely be  difficult and could certainly be complicated by insufficient healing.  He recommended that after his foot is entirely healed that we switch to  chronic use of a cam walker to prevent subsequent breakdown and not try  foot reconstruction surgery.  The sacral decubitus ulcer appears to be  stable.  There may be a component of fungal infection given the moisture  in this area.   PLAN:  After topical lidocaine 5% ointment was applied to the ulcers, a  #15 scalpel was used to debride the callus surrounding the left foot  ulcer and the macerated tissue around the sacral ulcer.  Following this,  Allevyn pad covered the left foot that was then covered with Profore,  then a total contact cast was applied to the left foot and leg.  The  sacral ulcer was treated with silver-coated dressing with ketoconazole  cream around the ulcer which can be switched to Desitin at home, and the  area was then covered with gauze and held in  place by Hypafix tape.  He  will have a wound center reevaluation in approximately 1 week with a  change of his total contact cast.  Again, plans are that when his left  foot ulcer completely heals, we will switch to chronic use of a cam  walker.           ______________________________  Barry Dienes Eloise Harman, M.D.    DGP/MEDQ  D:  11/29/2008  T:  11/30/2008  Job:  [id number]

## 2010-12-18 NOTE — Assessment & Plan Note (Signed)
[facility]   NAME:  Nicholas [name], Nicholas [name]              ACCOUNT NO.:  [id number]   MEDICAL RECORD NO.:  [id number]      DATE OF BIRTH:  06-29-1941   PHYSICIAN:  Barry Dienes. Eloise Harman, M.D.     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  He has not had significant pain in the left foot  or fever or chills.  He does have some chronic discomfort overlying the  sacrum where he had a prolonged treatment for a decubitus ulcer with  wound VAC.   OBJECTIVE:  VITAL SIGNS:  Blood pressure 141/64, pulse 66, respirations  19, most recent capillary blood glucose level 147.   The condition of the wound is as follows:  Wound #1:  It is located on the medial left mid foot and the ulcer has  resolved.  There remains a moderate amount of callus buildup, where the  ulcer had been.   ASSESSMENT:  Neuropathic ulcer healed up with continued heavy callus  buildup, likely from high pressure in the area despite use of a total-  contact cast.  He is at very high risk for subsequent wound breakdown if  he does not have custom inserts made to better distribute the pressure  in this area.   PLAN:  A #15 scalpel was used to debride the necrotic callus overlying  the site, where the ulcer had been.  Following this, personnel from  [employer] obtained an imprint of his foot, from which to construct custom  inserts for his shoes.  He was placed back in a total-contact cast using  Medifoam pad with tape, then applying the total-contact cast.  He will  return in approximately 1 week to have the cast removed, hopefully at  which time, we will be able to transition him to his shoes with custom  inserts.           ______________________________  Barry Dienes Eloise Harman, M.D.     DGP/MEDQ  D:  10/11/2008  T:  10/12/2008   Job:  [id number]

## 2010-12-18 NOTE — Assessment & Plan Note (Signed)
[facility]   NAME:  Nicholas [name], POTASH              ACCOUNT NO.:  [id number]   MEDICAL RECORD NO.:  [id number]      DATE OF BIRTH:  [birth year]   PHYSICIAN:  Barry Dienes. Eloise Harman, M.D. VISIT DATE:  12/20/2008                                   OFFICE VISIT   SUBJECTIVE:  The patient is a 70 year old man of [place] descent  who is seen for reevaluation of a left foot diabetic neuropathic ulcer  and a sacral decubitus ulcer.  The discomfort in his left leg has  improved, as well as the erythema and edema a bit.  He continues to have  moderate discomfort in both knees with ambulation.  Last week, he was  seen by his orthopedist who reportedly tapped his left knee joint and  found no signs of infection.  Nicholas [name] had significant GI discomfort  with ciprofloxacin, so he was switched to Septra DS with resolution of  the discomfort.  He has not had fever or chills lately.   OBJECTIVE:  VITAL SIGNS:  Blood pressure 104/62, pulse 50, respirations  17, temperature 98.3.   The condition of the wounds is as follows:  Wound #4:  It is located over the sacrum and measures 0.5 cm x 0.6 cm x  0.1 cm.  This wound has minimal necrotic debris and no eschar.  Wound  base is red and pale pink in color with some pink granulation tissue.  There was no exposed bone, tendon, or muscle and no foul odor.  There  was a scant amount of serous drainage and some maceration around the  wound.  Wound #5:  It is located on the medial and plantar left midfoot at the  prominent area.  The ulcer measures 1.0 cm x 0.9 cm x 0.1 cm.  This  wound does not have significant eschar but has minimal necrotic debris  with a red wound base and no significant granulation tissue.  There is  no exposed bone, tendon, or muscle and no foul odor.  There was a scant  amount of serous drainage and some callus around the wound.   The left leg shows interval decreased erythema and warmth compared with  his  last visit.   ASSESSMENT:  Interval improvement in sacral decubitus ulcer and stable  appearance of left foot neuropathic ulcer.  Improved appearance of the  left leg erythema and edema, consistent with resolving cellulitis.  Reportedly no evidence of left knee joint infection.   PLAN:  A #15 scalpel blade was used to debride the necrotic tissue  within the ulcer and the macerated tissue surrounding the ulcer  overlying the sacrum.  In addition, a #15 scalpel blade was used to  debride the hypertrophic callus surrounding the left foot ulcer.  This  was accomplished with only minimal bleeding that stopped with direct  pressure in short order.   After debridements were done, an Allevyn pad was applied to the left  foot ulcer and held in place by Medipore tape, which should be changed  twice weekly.  The buttock and sacral decubitus ulcer was covered with  silver alginate  dressing, covered by Anola Gurney, covered by gauze with Desogen around the  edges and held in place by Hypafix tape that should  be changed once  daily.  He will continue to wear the [facility] boot on the left side at all  times, and he will complete his course of Septra.           ______________________________  Barry Dienes. Eloise Harman, M.D.     DGP/MEDQ  D:  12/20/2008  T:  12/21/2008  Job:  [id number]   cc:   Lubertha Basque. Jerl Santos, M.D.

## 2010-12-18 NOTE — Assessment & Plan Note (Signed)
[facility]   NAME:  Nicholas [name], Nicholas [name]              ACCOUNT NO.:  [id number]   MEDICAL RECORD NO.:  [id number]      DATE OF BIRTH:  January 21, 1941   PHYSICIAN:  Barry Dienes. Eloise Harman, M.D. VISIT DATE:  10/25/2008                                   OFFICE VISIT   SUBJECTIVE:  The patient is a 70 year old man who has been treated with  a total contact cast for a neuropathic ulcer on the left foot.  He has  had an impression made of the left foot and we are awaiting completion  of his inserts and custom shoes.  He has not had significant pain in the  left foot.  He has had some discomfort in the skin overlying the sacrum  and some irritation despite attempts to keep weight off the area and use  of gel cushion for his wheelchair.  In addition, he has been putting  Balmex diaper cream in the buttock area at bedtime.   OBJECTIVE:  VITAL SIGNS:  Blood pressure 130/62, pulse 62, respirations  18, temperature 96.8.   The condition of the wounds is as follows:  Wound #3:  Located on the dorsum of the right foot has resolved  entirely.  Wound #1:  Located on the plantar left midfoot, no longer has an ulcer,  but there is some callus buildup in the area of the prominence in the  midfoot.  Wound #4:  It is a new wound that overlies the sacrum and measures 0.5  cm x 1.2 cm x 0.3 cm.  This wound does not have significant eschar.  He  has minimal necrotic debris with a red wound base and red granulation  tissue.  There was no exposed bone, tendon, or muscle, and no foul odor.  There was no significant discharge and some maceration in the periwound  area.   ASSESSMENT:  1. Healing of blister on the dorsum of the right foot.  2. Stable prominent area on the plantar aspect of the left foot with      some callus buildup.  3. Interval development of pressure ulcer overlying the sacrum.   PLAN:  The callus formation on the plantar aspect of the left foot was  pared using a #15 scalpel.   The area overlying the sacrum was treated  with iodoform covered by dry gauze, covered by Hypafix tape.  The left  foot was enclosed in total contact cast that will be change once weekly.  The sacrum wound will be change 3 times weekly.  His wife was shown how  to do the dressing changes.  It was reemphasized that he needed to  continue to minimize the time that he spends sitting upright on the  sacral ulcer.   He will have a physician reevaluation in approximately 1 week.           ______________________________  Barry Dienes. Eloise Harman, M.D.     DGP/MEDQ  D:  10/25/2008  T:  10/26/2008  Job:  [id number]

## 2010-12-18 NOTE — H&P (Signed)
NAME:  Nicholas [name], CALLENS NO.:  [id number]   MEDICAL RECORD NO.:  [id number]          PATIENT TYPE:  EMS   LOCATION:  [facility]                         FACILITY:  [facility]   PHYSICIAN:  Hollice Espy, M.D.DATE OF BIRTH:  1941-05-16   DATE OF ADMISSION:  02/08/2008  DATE OF DISCHARGE:                              HISTORY & PHYSICAL   PRIMARY CARE Dailen Mcclish:  Dr. Johnella Moloney.   CHIEF COMPLAINT:  Confusion.   HISTORY OF PRESENT ILLNESS:  The patient is a 70 year old white male  with a past medical history of diabetes mellitus, CAD with CHF, and  stage IV sacral decubitus who for the past week has been having problems  with cough and shortness of breath.  He saw his PCP, Dr. Kevan Ny, on  Thursday but since then his symptoms have continued to worsen.  For the  last day, he is noted be somewhat more confused, somewhat somnolent and  the family was concerned so they brought him to the emergency room.  The  patient's CT scan of the head was negative.  He is noted to have a  temperature of 101.1 with a heart rate of 104.  Labs were ordered on the  patient and he was found to have a white count 12.8 with an 80% shift.  Other labs noted a potassium of 2.8, a BUN of 38, creatinine of 1.36 and  a slightly elevated lactic acid level of 2.6.  Cardiac markers note an  elevated CPK but normal MB and troponin.  A chest x-ray done showed  evidence of a right lower lobe pneumonia and with these findings it was  felt that the patient had a pneumonia leading to dehydration and  confusion.  Because of the patient's confusion, it was felt best he come  into the hospital for further evaluation and treatment.  Currently, the  patient says he is doing okay but complains of feeling very thirsty and  he feels mildly short of breath, but denies any headaches, vision  changes or dysphagia.  No chest pain or palpitations.  No wheezing.  He  does complain of a cough, mostly nonproductive.  Occasionally  productive  of clear sputum.  No abdominal pain.  No hematuria or dysuria.  No  constipation, diarrhea, focal extremity numbness or weakness or pain  acutely.  The patient has chronic lower extremity edema.  He also has  chronic lower stage 4 decubitus ulcer.   PAST MEDICAL HISTORY:  Includes CHF with an unknown ejection fraction.  History of GERD.  Diabetes mellitus, glaucoma, CAD, diabetic  retinopathy, gout, hyperlipidemia, history of MRSA, septic knee, peptic  ulcer disease, anxiety, depression with psychotic features, obstructive  sleep apnea.   MEDICATIONS:  1. The patient is on Plavix 75.  2. Glucotrol 5.  3. Lantus 32 subcu nightly.  4. Flomax 0.4 p.o. b.i.d.  5. Cartia XT 240 p.o. daily.  6. Nexium 40 p.o. nightly.  7. Allopurinol 300 p.o. daily.  8. Atenolol 50 p.o. b.i.d.  9. Lipitor 10 p.o. daily.  10.Lasix 80 p.o. b.i.d.  11.K-Dur 20 mg p.o. daily.  12.Depakote  250 p.o. nightly.  13.Perphenazine 16 mg p.o. nightly.  14.Restoril 30 p.o. nightly.  15.Xanax 1 mg p.o. t.i.d.  16.Percocet p.o. 4 times a day.  17.Imdur 30 p.o. daily.  18.Mirtazapine 5 mg Mondays, Wednesdays, and Fridays.  19.Xalatan eyedrops 0.005% 1 drop each eye daily.  20.ComBgen left eye b.i.d.  21.Patanol 0.1% 1 drop each eye b.i.d.  22.He is also on home O2 2L.   He has no known drug allergies.   SOCIAL HISTORY:  No tobacco, alcohol or drug use.   FAMILY HISTORY:  Noncontributory.   PHYSICAL EXAM:  VITALS ON ADMISSION:  Temperature 101.1, heart 104,  blood pressure 134/53, respirations 20, O2 sat 96% of 4L.  GENERAL:  The patient is somewhat drowsy but able to awaken and answer  questions appropriately.  HEENT: Normocephalic atraumatic.  He has some mild purulent drainage  from his left eye.  Mucous membranes are slightly dry.  He has no  carotid bruits.  HEART:  Regular rate and rhythm.  S1 and S2, 2/6 systolic ejection  murmur.  LUNGS:  Decreased breath sounds secondary to body  habitus with more so  at the right base.  ABDOMEN:  Soft, nontender, obese, positive bowel sounds.  EXTREMITIES:  Show no clubbing, cyanosis, 3+ pitting edema from the  knees down, especially in the feet.   LAB WORK:  White count 12.83, H&H 16.1 and 49, MCV of 92, platelet count  229,000, 80% shift.  Sodium 143, potassium 2.8, chloride 102, bicarb 30,  BUN 30, creatinine 1.4, glucose 145.  LFTs are noted for albumin 3.2,  lipase 22.  Coags normal.  UA normal.  Ammonia normal.  Lactic acid 2.6.  CPK greater than 500, MB 2.1, troponin I less than 0.05.   PLAN:  1. Pneumonia likely acquired.  Treat with IV Avelox and oxygen.  2. Altered mental status secondary to dehydration.  3. Acute renal failure.  Treat with gentle IV fluids.  4. Hypokalemia, will replace.  5. History of stage IV sacral decubitus.  Air mattress.  6. Diabetes mellitus.  Continue sliding scale plus Lantus at a      decreased dose.  7. 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  Again, gently hydrate.  8. Glaucoma.  Continue eye drops.  9. Obstructive sleep apnea.      Hollice Espy, M.D.  Electronically Signed     SKK/MEDQ  D:  02/08/2008  T:  02/08/2008  Job:  [id number]   cc:   Candyce Churn, M.D.  Robert L. Dione Booze, M.D.

## 2010-12-18 NOTE — Discharge Summary (Signed)
NAME:  Nicholas [name], Nicholas [name] NO.:  [id number]   MEDICAL RECORD NO.:  [id number]          PATIENT TYPE:  INP   LOCATION:  3737                         FACILITY:  [facility]   PHYSICIAN:  Candyce Churn, M.D.DATE OF BIRTH:  1940/11/02   DATE OF ADMISSION:  06/22/2007  DATE OF DISCHARGE:  06/25/2007                               DISCHARGE SUMMARY   DISCHARGE DIAGNOSES:  1. E-coli urinary tract infection with early urosepsis, resolved.  2. 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  3. CO2 on home O2.  4. Hypertension.  5. 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  6. Diabetic retinopathy.  7. CAD status post CABG graft x2.  8. History of CHF.  9. Glaucoma  10.Gout.  11.Hyperlipidemia.  12.Morbid obesity.  13.BPH.  14.History of cerebrovascular accident.  15.History of sacral decubitus which is chronic under excellent      control.  16.GERD.  17.Obstructive sleep apnea.  18.Depression with psychotic features - controlled.  19.Anxiety.  20.History of peptic ulcer disease.  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   DISCHARGE MEDICATIONS:  As per HPI with the exception of potassium  chloride 40 mEq b.i.d., except on the days that he takes Zaroxolyn which  is 5 mg on Monday, Wednesday, Friday.   He will also be discharged on Levaquin 500 mg daily for seven days.   HOSPITAL COURSE:  [name] is a very pleasant 70 year old gentleman  with multiple medical issues including COPD, type 2 diabetes with  retinopathy, hypertension, CAD.  For two days prior to admission he had  foul-smelling urine, fevers, diarrhea, polyuria and worsening weakness.  Became confused on the morning of admission, and legs collapsed  underneath him.  He was brought to the [facility] Emergency Room where  he was found to have a systolic blood pressure of 78.  Found to have an  elevated white count in the  emergency room at 15,900 and urinalysis was  yellow, cloudy, specific gravity 1.028, large leukocyte, white cells too  numerous to count.  He also had bacteriuria.   He was given Rocephin in the emergency room and then transitioned to  Zosyn with his hypotension and transferred to the intensive care unit  where blood pressures were borderline in the 70s to low 90s, but  improved with aggressive fluid resuscitation.   After 24 hours, the patient's blood pressure was stable.  He was noted  to be transferred from the unit to a telemetry bed and has done very  well since.  He is alert and oriented and able to ambulate with his  walker after only 2-1/2 days of hospitalization and is ready to be  discharged home.   DISCHARGE LABORATORIES:  White cell count of 8800 with initial white  cell count on June 22, 2007, at 15,900.  Discharge hemoglobin 14.10,  platelet count 198,000.  B-met on discharge June 25, 2007, sodium  138, potassium 3.7, chloride 103, bicarb 30, BUN 6, creatinine 0.9,  glucose 96.  LFTs June 22, 2007, were within normal limits.  Lipase  was 20, albumin 3.0.  Cardiac enzymes from June 23, 2007, revealed a  troponin with CK 68, CK-MB 2.9, troponin 0.05 and on June 22, 2007,  day of admission CK 70, CK MB 1.5 and troponin 0.04.  Calcium on  November 28 was 8.2.  Blood cultures from June 22, 2007, revealed no  growth at discharge x2.  BNP June 25, 2007, was 168, down from 364  on June 24, 2007.  Diuretics had been resumed June 24, 2007.   CONDITION ON DISCHARGE:  Much improved, to be discharged home.  Medications as per HPI, except for a slight reduction in his potassium  dosing on the date that he is not taking Zaroxolyn.      Candyce Churn, M.D.  Electronically Signed     RNG/MEDQ  D:  06/25/2007  T:  06/25/2007  Job:  [id number]

## 2010-12-18 NOTE — Discharge Summary (Signed)
NAME:  [name], Nicholas NO.:  [id number]   MEDICAL RECORD NO.:  [id number]          PATIENT TYPE:  INP   LOCATION:  4732                         FACILITY:  [facility]   PHYSICIAN:  Candyce Churn, M.D.DATE OF BIRTH:  [birth year]   DATE OF ADMISSION:  02/08/2008  DATE OF DISCHARGE:  02/11/2008                               DISCHARGE SUMMARY   DISCHARGE DIAGNOSES:  1. Right lower lobe pneumonia - markedly improved.  2. Oral thrush, improved.  3. Hypokalemia resolved with level of 3.5 on February 10, 2008.  4. Leg edema much improved, multifactorial.  5. Type 2 diabetes mellitus.  6. Dehydration, resolved.  7. Mild renal insufficiency, improved with creatinine drop in 1.36 to      1.00 by discharge.  8. History of coronary artery disease.  9. Obstructive sleep apnea.  10.Glaucoma.  11.History of schizophrenia.  12.History depression.  13.Gout.  14.Glaucoma.   HOSPITAL COURSE:  [name] is a very pleasant 70 year old male  with history of diabetes, congestive heart failure, coronary artery  disease, and stage IV sacral decubitus who developed shortness of breath  and cough several days prior to admission.  He was brought into the  emergency room confused on February 08, 2008, was found to have a mildly  elevated white count of 12,800 with a left shift.  Potassium was low at  2.8.  Chest x-ray revealed right lower lobe infiltrate.  He was also  febrile to 101.   The patient was admitted and treated with IV Avelox and oxygen with  prompt improvement in symptoms with O2 saturations at discharge of 96%  on 2 L.  He is on chronic home O2.   DISCHARGE MEDICATIONS:  Will discharge on the following medications.  1. Plavix 75 mg daily.  2. Lantus 40 units subcu nightly.  3. Allopurinol 300 mg daily.  4. Atenolol 50 mg b.i.d.  5. Simvastatin 20 mg daily.  6. Furosemide 80 mg every a.m.  7. Glipizide 5 mg every a.m.  8. Combigan ophthalmic drops 1 drop in left eye  b.i.d.  9. Omeprazole 20 mg b.i.d.  10.Perphenazine 8 mg at bedtime.  11.Restoril 30 mg at bedtime.  12.Xanax 1 mg in the a.m. and 2 mg at bedtime.  13.Hydrocodone 5/500 mg 5 times daily.  14.Xalatan 0.5% 1 drop in each eye at bedtime.  15.Isordil 60 mg - 1/2 tablet daily.  16.Patanol eye drops 0.1% 1 drop in each eye at bedtime.  17.Valproic acid 250 mg at bedtime.  18.Terazosin 5 mg at bedtime.  19.Potassium chloride 20 mEq 3 tablets three times a day.  20.Spironolactone 25 mg b.i.d.  21.Levaquin 500 mg of 1/2 tablet daily for 8 days.   CONDITION ON DISCHARGE:  Improved.   DISCHARGE LABORATORIES:  Revealed the following:  Urine culture on February 08, 2008, revealed no growth.  BMET from February 10, 2008, revealed a sodium  148, potassium 3.5, chloride 107, bicarb 35, glucose 97, BUN 13, and  creatinine 1.00.  Cardiac panel from February 09, 2008, revealed a CK of 733,  CK-MB of 5.5, relative index  0.8, and troponin-I of 0.03.  CBC from February 09, 2008, revealed a white count 10,100, hemoglobin 13.6, and platelet  count 204,000.  Hemoglobin A1c on February 08, 2008, was with 6.7%.  Ammonia  level was 20.  Lipase was 22.  Lactic acid was mildly elevated at 2.6.  Protime was 13.2 seconds and PTT was 28 seconds.   Again, the patient was discharged in improved condition on February 11, 2008.      Candyce Churn, M.D.  Electronically Signed     RNG/MEDQ  D:  02/11/2008  T:  02/11/2008  Job:  [id number]

## 2010-12-18 NOTE — Assessment & Plan Note (Signed)
[facility]   NAME:  Nicholas [name], Nicholas [name]              ACCOUNT NO.:  [id number]   MEDICAL RECORD NO.:  [id number]      DATE OF BIRTH:  1941/02/17   PHYSICIAN:  Barry Dienes. Eloise Harman, M.D.     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  His cast is removed once weekly with doctor's visits every 2 weeks.  He  has not had significant pain in the foot or fever or chills.   OBJECTIVE:  VITAL SIGNS:  Blood pressure 91/51, pulse 54, respirations  18, temperature 98.1, most recent capillary blood glucose level was 157.  The condition of the wound is as follows:  Wound #1:  It is located on the medial left mid foot on the plantar  aspect.  The ulcer has completely been covered with epithelium.  Currently, there is no drainage and no foul odor.  There is a moderate  amount of callus buildup in the area of the ulcer and surrounding where  the ulcer had been.   ASSESSMENT:  Nearly fully healed left foot neuropathic ulcer from  diabetes mellitus.  It is likely that his foot shape has changed since  his last insert was made resulting in significant pressure in the area  the ulcer with eventual skin breakdown.   PLAN:  His total contact cast was removed, and his foot was washed  thoroughly.  Following this, a #15 scalpel was used to debride the ulcer  area of heavy callus buildup.  With this, there was not significant pain  or bleeding.  Following this, the total contact cast was reapplied with  usual padding on the plantar aspect of the foot but no specific wound  dressings.  We have made plans for him to have custom shoe inserts made  for his diabetic foot ulcer, hopefully at next week's appointment for  removal of his total contact cast by a local company, [employer].  He will  continue to have total contact cast applied  once weekly and has an  appointment in 1 week for a cast change and an additional appointment in  2 weeks for a physician reevaluation.           ______________________________  Barry Dienes Eloise Harman, M.D.     DGP/MEDQ  D:  09/20/2008  T:  09/20/2008  Job:  [id number]

## 2010-12-18 NOTE — H&P (Signed)
NAME:  Nicholas [name], Nicholas [name] NO.:  [id number]   MEDICAL RECORD NO.:  [id number]          PATIENT TYPE:  INP   LOCATION:  5041                         FACILITY:  [facility]   PHYSICIAN:  Michiel Cowboy, MDDATE OF BIRTH:  [birth year]   DATE OF ADMISSION:  12/26/2008  DATE OF DISCHARGE:                              HISTORY & PHYSICAL   PRIMARY CARE PHYSICIAN:  Candyce Churn, M.D.   CHIEF COMPLAINT:  Left foot swelling and redness.   HISTORY OF PRESENT ILLNESS:  The patient is a 70 year old diabetic with  history of coronary artery disease and chronic left foot ulcer followed  by wound clinic per the patient.  It has been swollen and red for about  a month with waxing and waning redness and mild associated discomfort.  He had been followed for that extensively by wound clinic last time seen  on  May 18.  He was supposed to wear a peripheral boot he says it was  too tight.  He does not like to wear it.  He was seen by orthopedics  today who called his primary care Tiyana Galla who instructed him to present  to the emergency department for swollen right leg.  Per his family, it  has been about the same for a month or so and his wound care physician  is aware of it.  He has been on multiple courses of antibiotics  including Septra, Cipro with variable results.  He has not had any  chills or fevers.  Last week he had transient altered mental status  which he attributes to his antibiotics.  He did not have any chest pain  or shortness of breath.  Otherwise review of systems unremarkable.   PAST MEDICAL HISTORY:  1. Diabetes.  2. Leg edema chronic  3. History of coronary disease, status post CABG.  4. Obstructive sleep apnea.  5. Glaucoma.  6. History of schizophrenia.  7. Depression.  8. History of decubitus ulcer of the sacrum.  9. Degenerative joint disease.  10.Hyperlipidemia.  11.History of MRSA.  12.Obesity.  13.Prostatic hypertrophy.  14.Remote history of  stroke.   SOCIAL HISTORY:  The patient used to smoke but quit 20 years ago.  Lives  at home as his wife help in the mother's home.   FAMILY HISTORY:  Noncontributory.   ALLERGIES:  No known drug allergies.   MEDICATIONS:  1. Plavix 75 mg daily.  2. Lantus 60 units at night.  3. Nexium 40 mg daily.  4. Allopurinol 300 mg daily.  5. Lasix 80 mg daily.  6,.  Temazepam 30 mg at bedtime.  1. Xanax 1 mg b.i.d.  2. __________ acetaminophen.  3. Isosorbide mononitrate 30 mg daily.  4. Atenolol 50 mg b.i.d.  5. Zosyn 5 mg at bedtime.  6. Potassium 50 mEq three times a day.  7. Xalatan 0.005%.  Take 1 drop in each eye at bedtime.  8. Patanol 0.1% one drop to each eye twice a day.  9. Combigan one drop to left eye twice a day.  10.Divalproex 250 mg at bedtime.   PHYSICAL EXAMINATION:  VITAL  SIGNS:  Temperature 97, blood pressure  144/54, respirations 18, oxygen saturation 94% on room air.  GENERAL:  The patient appears to be in no acute distress.  HEENT:  Nontraumatic.  Moist mucous membranes.  LUNGS:  Clear to auscultation bilaterally, but somewhat distant.  HEART:  Regular rate and rhythm.  No murmurs could be appreciated but  also very distant.  ABDOMEN:  Diffusely obese but nontender.  EXTREMITIES:  Without clubbing or cyanosis.  There is a diffuse edema of  bilateral lower extremities which are also very obese.  His left leg  looks swollen with going up to the knee, slight warmth.  On the foot  there was noted about a 1 cm ulcer with granulation tissue.  No eschar.  BACK:  The patient has a history of sacral decubitus as well which is  poorly visualized.   LABORATORY DATA:  White blood cell count 6.9, hemoglobin 16.  Sodium  142, potassium 3.8, creatinine 1.4 which is up from his baseline.  Left  foot film showing could not rule out osteo and possible foreign body.   ASSESSMENT AND PLAN:  1. This is a 70 year old gentleman with diabetes and diabetic foot      ulcer with  possible cellulitis. Not quite sure if this is a chronic      change.  Will admit for IV antibiotics since cannot rule out osteo      at this point.  Will obtain sed rate and CRP.  If he can tolerate,      will try to do an MRI since his plain films were not definitive.      For now will cover him with Zosyn and vancomycin.  Write for wound      care.  Recommend orthopedics follow-up.  2. History of diabetes.  Continue Lantus as per sliding scale.  3. History of schizophrenia.  Continue Depakote although I am not sure      if what he has is a true history of schizophrenia at this point.  4. History of hyperlipidemia, stable.  5. Hypertension.  Continue home medications.  Otherwise stable.  6. Mild dehydration, elevated creatinine.  Will give gentle IV fluids.      Hold Lasix.  7. Code status.  The patient wished to be DNR/DNI which was confirmed      by him and his family.  8. Prophylaxis.  Protonix and Lovenox.      Michiel Cowboy, MD  Electronically Signed     AVD/MEDQ  D:  12/26/2008  T:  12/27/2008  Job:  [id number]   cc:   Candyce Churn, M.D.

## 2010-12-18 NOTE — Assessment & Plan Note (Signed)
[facility]   NAME:  Nicholas [name], Nicholas [name]              ACCOUNT NO.:  [id number]   MEDICAL RECORD NO.:  [id number]      DATE OF BIRTH:  02/15/1941   PHYSICIAN:  Jake Shark A. Tanda Rockers, M.D. VISIT DATE:  04/27/2007                                   OFFICE VISIT   SUBJECTIVE:  Nicholas [name] is a 69 year old man who we have followed for  a stage IV sacral decubitus.  In the interim, he has treated with  antiseptic soap washes daily.  His last visit was cancelled due to the  marked clinical improvement.  However, over the last 3-4 weeks, there  has been some breakdown.  There has been no excessive malodor. No  drainage.  There has been no fever.  His appetite remains good.  His  sugars are under reasonable control.   OBJECTIVE:  Blood pressure is 129/65, respirations 18, pulse rate 72,  temperature is 98.5, capillary blood glucoses 119 mg percent.  Inspection of the sacral wound shows that the wound itself has increased  in his transverse length.  There continues to be healthy-appearing  granulation with evidence of advancing epithelium.  There is no  excessive drainage or malodor.  There is no evidence of active or  invasive infection.  The wound was probed with a Q-tip and there does  not appear to be a sinus cavities or undermining.   ASSESSMENT:  Chronic wound stage IV sacral decubitus.   PLAN:  Will start the patient on a topical Cleocin.  We will continue  the local hygiene efforts.  We will reevaluate him in 2 weeks.  His wife  is comfortable in doing his dressings.      Harold A. Tanda Rockers, M.D.  Electronically Signed     HAN/MEDQ  D:  04/27/2007  T:  04/27/2007  Job:  [id number]

## 2010-12-18 NOTE — Assessment & Plan Note (Signed)
[facility]   NAME:  Nicholas [name], Nicholas [name]              ACCOUNT NO.:  [id number]   MEDICAL RECORD NO.:  [id number]      DATE OF BIRTH:  [birth year]   PHYSICIAN:  Lenon Curt. Chilton Si, M.D.   VISIT DATE:  12/09/2008                                   OFFICE VISIT   HISTORY:  A 70 year old male returns for recheck of wounds of his left  first metatarsal head and buttock.  The patient feels that these areas  were doing reasonably well.  There has been no significant increase in  pain, drainage, or discomfort.  He was put on doxycycline in the past  and continues on that.  He had left lower extremity venous duplex  evaluation done on Dec 06, 2008, for swelling of his leg.  There was no  evidence of deep vein or superficial thrombosis involving the left lower  extremity or evidence of Baker's cyst on the left.   PHYSICAL EXAMINATION:  Wounds appear much the same as previously  described by Dr. Jarold Motto.  See note of November 03, 2008.  No debridement  was deemed necessary today.   TREATMENT:  Left first metatarsal head, had Allevyn applied and Profore.  This should be done twice weekly.  Wound to the buttock, had Desitin  applied edges silver alginate, Xtrasorb, gauze, and Hypafix.  This  should be done daily.  He is to continue his doxycycline.  He will  return to see Dr. Jarold Motto on Dec 13, 2008.   ICD-9 code 454.0 varicose veins with stasis ulcer.  707.07 pressure ulcer of the coccyx, 707.22 pressure ulcer stage II.   CPT [id number].      Lenon Curt Chilton Si, M.D.  Electronically Signed     AGG/MEDQ  D:  12/09/2008  T:  12/10/2008  Job:  [id number]

## 2010-12-18 NOTE — Assessment & Plan Note (Signed)
[facility]   NAME:  Nicholas [name], Nicholas [name]              ACCOUNT NO.:  [id number]   MEDICAL RECORD NO.:  [id number]      DATE OF BIRTH:  1941/04/21   PHYSICIAN:  Barry Dienes. Eloise Harman, M.D.     VISIT DATE:                                   OFFICE VISIT   SUBJECTIVE:  The patient is a 70 year old man who is seen for  reevaluation of a left foot diabetic neuropathic ulcer and a sacral  decubitus ulcer.  His wife has been changing the dressings on the sacral  decubitus ulcer every day and understands how to properly apply the  dressings.  We have been changing the Profore wraps on the left foot  once weekly.   OBJECTIVE:  VITAL SIGNS:  Blood pressure 132/70, pulse 67, respirations  18, temperature 98.7.   The condition of the wounds is as follows:  Wound #4:  This is located overlying the sacrum towards the left side  and measures 0.6 cm x 1.0 cm x 0.2 cm.  This wound does not have  significant eschar and has minimal necrotic debris within it.  The wound  base is red in color with some red granulation tissue.  There was no  exposed bone, tendon, or muscle, and no foul odor.  There was a scant  amount of serous drainage and moderate macerated tissue around the  ulcer.  Wound #5:  This is located on the plantar aspect of the left foot and  measures 0.8 cm x 0.4 cm x 0.1 cm.  This wound does not have significant  eschar or necrotic debris.  The wound base is red in color with red  granulation tissue.  There was no exposed bone, tendon, or muscle, and  no foul odor.  There was no significant discharge and the periwound  integrity was intact.   ASSESSMENT:  No significant interval change in the appearance of the  sacral decubitus ulcer with a continued moderate amount of necrotic  tissue surrounding the ulcer with some necrotic tissue within the ulcer.  The left foot ulcer is slightly improved and certainly the appearance of  this left foot and left leg, now lacking erythema,  is much improved.   PLAN:  After 5% lidocaine gel was applied to the sacral decubitus ulcer,  a #15 scalpel blade was used to selectively debride an area less than 20  cm2 of necrotic tissue within and surrounding the wound.  There was no  significant discomfort or bleeding.  I discussed with his wife how  difficult to fully debride his wound in the sacrum because of its depth  and bleeding with disruption of the macerated tissue.  She and her  husband are in agreement to try a new approach.  After the wound was  debrided, we applied an Allevyn pad covered by Profore wrap to the left  foot ulcer and leg which will be changed once weekly.  To the sacral  area wound, we applied silver alginate covered by Anola Gurney with Desitin  at the edges and Hypafix to hold the dressing in place.  This will be  repeated once daily.  We will obtain a biologic debridement via [facility] treatment of the sacral wound as soon as possible.  He  will be  seen for a physician reevaluation in approximately 1 week.           ______________________________  Barry Dienes. Eloise Harman, M.D.     DGP/MEDQ  D:  01/17/2009  T:  01/18/2009  Job:  [id number]

## 2010-12-18 NOTE — Assessment & Plan Note (Signed)
[facility]   NAME:  Nicholas [name], Nicholas [name]              ACCOUNT NO.:  [id number]   MEDICAL RECORD NO.:  [id number]      DATE OF BIRTH:  03-05-1941   PHYSICIAN:  Barry Dienes. Eloise Harman, M.D. VISIT DATE:  12/13/2008                                   OFFICE VISIT   SUBJECTIVE:  The patient is a 70 year old man of [place] descent  who is seen for reevaluation of his left foot diabetic neuropathic ulcer  and a sacral decubitus ulcer.  He again had discomfort with the left  lower extremity total contact cast, so it was removed and he was  switched to a walking boot.  The leg still has moderate discomfort and  edema.  At his Dec 06, 2008 visit, he was empirically started on  doxycycline 100 mg twice daily for 14 days, which he has continued.  His  wife notes that in addition to the left thigh and leg discomfort, he has  had moderate left knee pain and had some fevers over the weekend, for  which he declined an emergency room evaluation.  Today, he has not had  fever and he has had some breakfast without difficulty.   OBJECTIVE:  VITAL SIGNS:  Blood pressure 125/70, pulse 56, respirations  21, temperature 98.4, most recent capillary blood glucose level was 65  prior to receiving a snack.  EXTREMITIES:  There is 2+ edema of the left foot and entire left lower  extremity.  There is erythema and warmth centered over the left knee and  moderate pain with passive extension of the left lower extremity at the  knee.   The condition of the wounds is as follows:  Wound #4 is located overlying the sacrum and measures 0.5 cm x 0.9 cm x  0.1 cm.  This wound does not have significant eschar but has minimal  necrotic debris with a red wound base and pale pink granulation tissue.  There was no exposed bone, tendon, or muscle and no foul odor.  There  was a scant amount of serosanguineous drainage and mild maceration in  the surrounding tissues.   Wound #5 is located on the left foot  on the medial midfoot plantar  surface.  This ulcer measures 1.0 cm x 1.0 cm x 0.1 cm.  There was no  significant eschar and minimal necrotic debris with a red and pale pink  wound base and pink granulation tissue.  There was no exposed bone,  tendon, or muscle and no foul odor.  There was a scant amount of  serosanguineous discharge and an intact periwound integrity.   On Dec 06, 2008, he had a left lower extremity DVT ultrasound exam that  showed no evidence of deep pain or superficial vein thrombosis in the  left lower extremity and no evidence of Baker cyst on the left.   A wound culture from the left midfoot obtained on Dec 09, 2008, grew out  abundant Enterobacter aerogenes with rare white blood cells present,  predominately PNN.  The organism was sensitive to Zosyn, imipenem,  ceftriaxone, gentamicin, ciprofloxacin, and Septra.   ASSESSMENT:  Stable appearance of left foot neuropathic ulcer with no  obvious evidence of infection at the foot level.  There was an interval  improvement in  the appearance of the sacral decubitus ulcer.  The  persistent left lower extremity erythema, pain with passive extension of  the left knee, and recent fever is concerning and could be due to joint  infection or a flare of gout.  Cellulitis seems less likely as the  distal left lower extremity is without erythema and there are no skin  breaks.   PLAN:  A #15 scalpel was used to debride the minimal necrotic debris on  the left foot ulcer and within the sacral decubitus ulcer.  This was  done without significant bleeding or discomfort.  The left foot ulcer  was treated with Allevyn covered by Profore wrap, which will be changed  twice weekly.  The buttock decubitus ulcer (wound 4) will be treated  with Desitin on the edges and silver alginate covered by XTRASORB,  covered by gauze, and held in place with Hypafix that will be changed  daily.  He will continue his course of doxycycline and will  continue to  use PRAFO boot on the left.  In addition, he was given a prescription  for ciprofloxacin 500 mg twice daily for 14 days.  I will continue to  attempt to contact his orthopedist today to inform him of the status of  his wounds and left knee discomfort.           ______________________________  Barry Dienes. Eloise Harman, M.D.     DGP/MEDQ  D:  12/13/2008  T:  12/14/2008  Job:  [id number]   cc:   Lubertha Basque. Jerl Santos, M.D.

## 2010-12-18 NOTE — Assessment & Plan Note (Signed)
[facility]   NAME:  Nicholas [name], REIMERS              ACCOUNT NO.:  [id number]   MEDICAL RECORD NO.:  [id number]      DATE OF BIRTH:  [birth year]   PHYSICIAN:  Barry Dienes. Eloise Harman, M.D. VISIT DATE:  11/03/2008                                   OFFICE VISIT   SUBJECTIVE:  The patient is a 70 year old man of [place] descent  who is seen for reevaluation of his left foot diabetic neuropathic ulcer  and a sacral decubitus ulcer.  He again had discomfort with use of a  total contact cast, so this was removed on November 24, 2008, and he was  switched to treatment with Allevyn, Profore wrap, and a cam walker with  instructions to return for total contact casting.  He has not had fever  or chills.  He has had some erythema and edema of the left leg.  He has  not had worsening shortness of breath.   OBJECTIVE:  VITAL SIGNS:  Blood pressure 165/65, pulse 64, respirations  20, and temperature 98.4.   The condition of the wounds is as follows:  The wound on the plantar  left mid foot measures 1.0 cm x 1.0 cm x 0.1 cm.  There is no  significant eschar.  There is minimal necrotic tissue with a pale pink  wound base and pink granulation tissue.  There is no exposed bone,  tendon, or muscle and no foul odor.  There is a scant amount of  serosanguineous discharge and an intact periwound integrity.   The wound overlying the sacrum does not have significant eschar but has  minimal necrotic debris within it and a moderate amount of macerated  tissue surrounding it.  This wound lies deep within tissue folds in a  moist area.   ASSESSMENT:  Stable appearance of left foot diabetic neuropathic ulcer  with what appears to be cellulitis of the left leg.  A less likely  etiology would be a deep vein thrombosis of the left lower extremity.  The wound overlying the sacrum has been difficult to eradicate likely  due to the moisture in the area.   PLAN:  After 5% lidocaine ointment was  applied to the sacral wound and  the wound on left foot, a #15 scalpel blade was used to debride the  necrotic tissue from the wounds.  This was accomplished without  significant bleeding on the left foot.  With a sacral ulcer, attempts to  remove the macerated tissue remnant with bleeding that was easily  stopped with application of pressure gauze.   After debridement was done (selective and less than 20 sq cm area), the  left foot ulcer was treated with Allevyn pad covered by Profore, then a  total contact cast, which will be changed biweekly.  The lesion on the  overlying the sacrum was treated with ketoconazole on the edges, which  will be switched with Desitin at home.  A silver-coated dressing was put  on the ulcer itself covered by Anola Gurney covered by gauze and held in  place by Hypafix.  This wound should be changed every other day.  In  addition, he was given a prescription for doxycycline 100 mg twice daily  for 14 days.  We will obtain a culture  of the left foot ulcer with his  next cast change this coming Friday.  In  addition, we will obtain an ultrasound study of the left lower extremity  to exclude the possibility of a DVT.  He will have a followup physician  visit in approximately 1 week or sooner if he should have discomfort  with the total contact cast.           ______________________________  Barry Dienes. Eloise Harman, M.D.     DGP/MEDQ  D:  12/06/2008  T:  12/07/2008  Job:  [id number]

## 2010-12-18 NOTE — Assessment & Plan Note (Signed)
[facility]   NAME:  Nicholas [name], Nicholas [name]              ACCOUNT NO.:  [id number]   MEDICAL RECORD NO.:  [id number]      DATE OF BIRTH:  [birth year]   PHYSICIAN:  Barry Dienes. Eloise Harman, M.D.     VISIT DATE:                                   OFFICE VISIT   SUBJECTIVE:  The patient is a 70 year old man with diabetes mellitus,  type 2 who was seen for reevaluation of a sacral decubitus ulcer.  He  has not had significant discomfort in the area.   OBJECTIVE:  VITAL SIGNS:  Blood pressure 135/71, pulse 46, respirations  20, temperature 98.6, most recent capillary blood glucose level was 152.  EXTREMITIES:  The condition of the ulcer is as follows:  Wound #4:  This  is located overlying the sacrum and measures 2.0 cm x 3.0 cm x 0.1 cm.  There is no overlying eschar.  There was minimal necrotic debris with a  red wound base and no granulation tissue.  There was no exposed bone,  tendon, or muscle and no foul odor.  There was a scant amount of serous  drainage and some maceration surrounding the ulcer.   ASSESSMENT:  Stable appearance of sacral decubitus ulcer.  Unfortunately, there were some technical problems with obtaining repeat  Medical [facility]gots treatment for him, so he has had only one treatment.   PLAN:  After 5% lidocaine ointment was applied to the ulcer, a #15  scalpel was used to debride the necrotic debris overlying the ulcer and  the macerated tissue.  This was done without significant bleeding or  discomfort.  The area of selective debridement was less than 20 sq. cm.  Following this, the wound was treated with Promogran with hydrogel  covered by gauze and Desitin diaper ointment around the edges of the  wound.  His wife will change the dressings in this fashion once daily.  We have requested a physician reevaluation in approximately 2 weeks.           ______________________________  Barry Dienes Eloise Harman, M.D.     DGP/MEDQ  D:  02/28/2009  T:  03/01/2009  Job:   [id number]

## 2010-12-18 NOTE — Assessment & Plan Note (Signed)
[facility]   NAME:  [name], [name]              ACCOUNT NO.:  [id number]   MEDICAL RECORD NO.:  [id number]      DATE OF BIRTH:  October 16, 1940   PHYSICIAN:  Leonie Man, M.D.    VISIT DATE:  11/08/2008                                   OFFICE VISIT   PROBLEM:  Sacral decubitus ulcer.   [name] is a 70 year old man with diabetes, who has been recently  treated for Charcot foot.  He now has recurrence of a sacral decubitus  ulcer which was present in the past and took some 4 years to close.  The  ulcer at its last measurements was 0.5 x 0.7 x 0.3 with a surrounding  area of callus formation.  He was treated last with hydrogel dressings,  which have been being changed by his wife over the past week.  He  returns today for reevaluation.   On examination, he is afebrile.  Temperature 98.4, pulse is 62,  respirations 19, and blood pressure 149/64.  His capillary blood glucose  is 111.   On examination of the wound on today's measurement is 0.7 x 0.9 x 0.8.  This pressure ulcer shows a clean granulating base.  There is some  surrounding callus formation.  There is no odor.  No drainage.   I am going to continue him this week on some SilvaSorb gauze dressings  which will be changed by his wife.  I give him a prescription for  SilvaSorb and will have him come back and see [place] in 1 week.      Leonie Man, M.D.  Electronically Signed     PB/MEDQ  D:  11/08/2008  T:  11/09/2008  Job:  [id number]

## 2010-12-18 NOTE — Assessment & Plan Note (Signed)
[facility]   NAME:  Nicholas [name], Nicholas [name]              ACCOUNT NO.:  [id number]   MEDICAL RECORD NO.:  [id number]      DATE OF BIRTH:  1941-06-26   PHYSICIAN:  Barry Dienes. Eloise Harman, M.D.     VISIT DATE:                                   OFFICE VISIT   SUBJECTIVE:  The patient is a 70 year old man who has been treated for a  left foot neuropathic ulcer with total contact casting.  His cast is  removed once weekly with doctor's visits to every 2 weeks.  He has not  had significant pain in the foot or fever or chills.  He is most  troubled by chronic right knee pain, for which knee replacement surgery  was not recommended due to previous MRSA infection.   OBJECTIVE:  VITAL SIGNS:  Blood pressure 122/72, pulse 66, respirations  16, temperature 97.9, and most recent capillary blood glucose level 113.  The condition of the wound is as follows:  Wound #1:  It is located on the medial aspect of the left mid foot on  the plantar aspect.  The ulcer measures 0.7 cm x 0.5 cm x 0.1 cm.  There  is no significant eschar or necrotic tissue on the ulcer.  There is a  red wound base with red granulation tissue.  There was no exposed bone,  tendon, or muscle, no foul odor, and a small amount of serosanguineous  discharge.  There was a small amount of callus surrounding the ulcer.   ASSESSMENT:  Healing left foot diabetic neuropathic ulcer.   PLAN:  A #15 scalpel was used to gently debride the callus surrounding  the ulcer.  Less than 20 square centimeter of tissue was removed.  There  was no significant pain or bleeding.  Following this, the ulcer was  covered with silver alginate, then XtraSorb, and then a total contact  cast was reapplied.  He will have the cast change once weekly by nursing  staff and should have a physician reevaluation in approximately 2 weeks.           ______________________________  Barry Dienes Eloise Harman, M.D.     DGP/MEDQ  D:  09/06/2008  T:  09/06/2008   Job:  [id number]

## 2010-12-18 NOTE — Assessment & Plan Note (Signed)
[facility]   NAME:  Nicholas [name], Nicholas [name]              ACCOUNT NO.:  [id number]   MEDICAL RECORD NO.:  [id number]      DATE OF BIRTH:  1941-02-13   PHYSICIAN:  Barry Dienes. Eloise Harman, M.D.     VISIT DATE:                                   OFFICE VISIT   SUBJECTIVE:  The patient is a 70 year old man who has a left foot  neuropathic ulcer that has been treated with total contact casting.  His  foot is misshapen.  So last week, he had custom imprints of his foot  made so that he can have custom inserts made for his shoes.  He also  notes some discomfort on the dorsum of the right foot.   OBJECTIVE:  VITAL SIGNS:  Blood pressure 129/66, pulse 65, respirations  18, temperature 97.5.  The condition of the wound is as follows:  Wound #1:  Overlies the left medial midfoot and has resolved.  There is  a small amount of callus in this prominent area without any skin  breakdown.  Over the distal mid dorsum of the right foot, there is an area of less  than 0.2 cm x less than 0.2 cm x 0.1 cm that consists of a blister  without significant erythema or exudate.   ASSESSMENT:  1. Healed left foot neuropathic ulcer from diabetes mellitus with high      risk for recurrence without proper pressure offloading.  2. Small friction blister on the dorsum of the right foot without      evidence of infection.   PLAN:  No debridements were done today.  The total contact cast was  replaced on the left foot.  In addition, he was advised to put a Telfa-  like gauze overlying the ulcer on the dorsum of the right foot that can  be changed once daily.  He was advised to continue treatment to continue  the treatment to the buttock area with Balmex nightly.  He is advised to  schedule a followup physician visit in 1 week.  Hopefully at that time,  he will be able to be started on his new custom inserts shoes.   He was given a prescription for gel cushion for his wheelchair to  prevent recurrent  sacral decubitus ulcer formation.           ______________________________  Barry Dienes Eloise Harman, M.D.     DGP/MEDQ  D:  10/18/2008  T:  10/19/2008  Job:  [id number]

## 2010-12-18 NOTE — Assessment & Plan Note (Signed)
[facility]   NAME:  Nicholas, [name]              ACCOUNT NO.:  [id number]   MEDICAL RECORD NO.:  [id number]      DATE OF BIRTH:  1941/03/08   PHYSICIAN:  Barry Dienes. Eloise Harman, M.D.     VISIT DATE:                                   OFFICE VISIT   SUBJECTIVE:  The patient is a 70 year old man of [place] descent  who is seen for reevaluation of a sacral decubitus ulcer.  His wife has  dutifully been treating the ulcer as recommended.  He has not had  significant pain in the area or fever or chills.   OBJECTIVE:  Vital signs:  Blood pressure 133/67, pulse 57, respirations  18, temperature 97.9.  Most recent capillary blood glucose level was  121.   The condition of the wound is as follows:  The wound overlies the  sacrum, slightly toward the left side.  It measures 1.4 mm x 2.6 cm x  0.1 cm.  The wound does not have significant eschar but has minimal  necrotic debris.  The wound base is red in color with some red  granulation tissue.  There is no exposed bone, tendon, or muscle and no  foul odor.  There was scant yellow drainage, and there is some  maceration of the surrounding tissue.   ASSESSMENT:  Slowly improving sacral decubitus ulcer.  The wound was  delayed in healing by fungal infection but has been doing better with  initiation of antifungal treatment.   PLAN:  After 5% lidocaine ointment was applied to the ulcer, a #10  scalpel was used to debride the necrotic tissue from within it.  This  was accomplished without significant bleeding or pain.  Following this,  the wound was treated with soap and water wash and abrading the area;  then a mixture of Lamisil cream and KY jelly was applied.  Desitin  barrier cream was applied to the edges.  The wound will be treated in  this fashion once daily by his wife.  He will have a physician re-  evaluation in approximately 2 weeks.           ______________________________  Barry Dienes Eloise Harman,  M.D.     DGP/MEDQ  D:  03/28/2009  T:  03/29/2009  Job:  [id number]

## 2010-12-18 NOTE — Assessment & Plan Note (Signed)
[facility]   NAME:  Nicholas [name], Nicholas [name]              ACCOUNT NO.:  [id number]   MEDICAL RECORD NO.:  [id number]      DATE OF BIRTH:  1940-09-20   PHYSICIAN:  Barry Dienes. Eloise Harman, M.D.     VISIT DATE:                                   OFFICE VISIT   SUBJECTIVE:  The patient is a 70 year old man with diabetes mellitus,  type 2, and a left foot neuropathic ulcer that has been difficult to  eradicate.  He also has a sacral decubitus ulcer.  He was recently  hospitalized with significant cellulitis of the left lower extremity  associated with nausea and vomiting.  He was treated with IV antibiotics  and transition to Augmentin which he is continuing with.  The pain in  the left leg has improved somewhat.   OBJECTIVE:  VITAL SIGNS:  Blood pressure 146/75, pulse 51, respirations  20, temperature 98.6, most recent capillary blood glucose level was 103.   The condition of the wounds is as follows:  Wound #4:  It is located in the sacral region and measures 0.6 cm x 1.1  cm x 0.2 cm.  This wound does not have significant eschar, but has  minimal necrotic debris with pale pink wound base and pink granulation  tissue.  There is no exposed bone, tendon, or muscle and no foul odor.  There was scant drainage that was serous.  There was some maceration of  the adjacent tissue.   Wound #5:  It is located on the medial plantar left midfoot.  Measures  0.5 cm x 0.5 cm x 0.2 cm.  This ulcer does not have significant eschar  or necrotic tissue.  The wound base is pale pink in color with some pink  granulation tissue.  There was no exposed bone, tendon, or muscle, and  no foul odor.  There was no significant drainage and there was moderate  callus surrounding the wound.   ASSESSMENT:  Intimal improved appearance of cellulitis in the left leg.  It is unclear if his symptoms leading to hospitalization were due to his  cellulitis and wounds versus a community viral syndrome that has  caused  similar symptoms.  He is currently stable.   PLAN:  After 5% lidocaine ointment was applied to both wounds, a #15  scalpel was used to debride the necrotic callus surrounding the left  foot ulcer as well as the necrotic tissue around the sacral decubitus  ulcer.  I reviewed proper placement of the wound treatments with his  wife who does his daily treatments at home.  On the left leg, we applied  an Allevyn pad over the ulcer covered by a Profore wrap, it should be  done once weekly.  On the sacral area, we applied silver alginate  covered by Anola Gurney and held in place by Hypafix with Desogen around the  edges to protect the skin and this treatment will be repeated once  daily.  He was advised to purchase Align and take one capsule daily for  the next 30 days to prevent antibiotic associated colitis.  He will have  a wound visit in approximately 1 week for the Profore wrap change and a  physician reevaluation in approximately 2 weeks.  ______________________________  Barry Dienes Eloise Harman, M.D.    DGP/MEDQ  D:  01/03/2009  T:  01/04/2009  Job:  [id number]

## 2010-12-18 NOTE — Assessment & Plan Note (Signed)
[facility]   NAME:  Nicholas [name], Nicholas [name]              ACCOUNT NO.:  [id number]   MEDICAL RECORD NO.:  [id number]      DATE OF BIRTH:  June 25, 1941   PHYSICIAN:  Barry Dienes. Eloise Harman, M.D.     VISIT DATE:                                   OFFICE VISIT   SUBJECTIVE:  The patient is a 70 year old man of [place] descent  who is seen for reevaluation of a left foot neuropathic ulcer.  At last  visit, he was started on treatment with a total contact cast.  He has  had weekly total contact cast replacements and is seen for his first  physician reevaluation.  He has not had significant pain in the foot and  has not had fever or chills.   OBJECTIVE:  VITAL SIGNS:  Blood pressure 133/71, pulse 66, respirations  20, temperature 98, most recent capillary blood glucose level was 105.  The condition of the wound is as follows:  On the medial aspect of the  left foot and the plantar aspect is an ulcer that measures 2.1 cm x 2.0  cm x 0.2 cm.  This is a Wagner grade 1 neuropathic ulcer that does not  extend to tendon, bone, or joint capsule.  There is no significant  eschar or necrotic debris within the ulcer.  The wound base is red, and  there is red granulation tissue.  There is no exposed bone, tendon, or  muscle and no foul odor.  There was a large amount of serosanguineous  discharge and some thick callus buildup around the wound.   ASSESSMENT:  Slowly improving neuropathic ulcer of the left foot with a  moderate amount of serosanguineous discharge causing some maceration and  surrounding callus buildup.   PLAN:  A #15 scalpel was used to debride the heavy callus buildup around  the ulcer, which he tolerated well without significant bleeding or  discomfort.  Following this, the ulcer was covered with a silver  alginate dressing covered by Lyofoam, then a total contact cast.  He  should have a nurse reevaluation approximately 1 week for total contact  cast replacement and  a physician reevaluation in approximately 3 weeks.           ______________________________  Barry Dienes Eloise Harman, M.D.     DGP/MEDQ  D:  07/26/2008  T:  07/27/2008  Job:  [id number]

## 2010-12-18 NOTE — Assessment & Plan Note (Signed)
[facility]   NAME:  NIEKO, CLARIN NO.:  [id number]   MEDICAL RECORD NO.:  [id number]      DATE OF BIRTH:  1941-05-28   PHYSICIAN:  Joanne Gavel, M.D.         VISIT DATE:  11/22/2008                                   OFFICE VISIT   HISTORY OF PRESENT ILLNESS:  A 70 year old male with a right foot  diabetic neuropathic ulcer which has undergone contact casting and a  sacral decubitus ulcer.  The sacral decubitus ulcer is quite small,  basically is flat in the midst of a sizable epithelialized wound.  Last  visit on November 15, 2008, it was 0.9 x 0.6 x 0.2, now it is essentially  the same.  I think this wound should be considered for VAC.  The base  was extremely clean.  Granulation tissues are not exuberant but that  might help healing.  The plantar foot ulcer is pink with good-looking  granulation tissue and seems to be responding to total contact casting.   PLAN:  See the patient in 7 days, consider VAC.      Joanne Gavel, M.D.  Electronically Signed     RA/MEDQ  D:  11/22/2008  T:  11/22/2008  Job:  [id number]

## 2010-12-18 NOTE — Assessment & Plan Note (Signed)
[facility]   NAME:  Nicholas [name], Nicholas [name]              ACCOUNT NO.:  [id number]   MEDICAL RECORD NO.:  [id number]      DATE OF BIRTH:  1941/02/12   PHYSICIAN:  Barry Dienes. Eloise Harman, M.D. VISIT DATE:  08/02/2008                                   OFFICE VISIT   SUBJECTIVE:  The patient is a 70 year old man who has been treated for a  left foot neuropathic ulcer with total contact casting.  He has not had  significant pain in the foot or fever or chills.   OBJECTIVE:  VITAL SIGNS:  Blood pressure 123/69, pulse 59, respirations  20, temperature 98.9, most recent capillary blood glucose level was 111.   The condition of the wound is as follows:  Wound #1:  It is located in the medial left plantar foot and measures  2.4 cm x 2.3 cm x 0.2 cm.  This is a Wagner grade 1 wound that does not  have significant eschar.  It has minimal necrotic debris with a red  wound base and red granulation tissue.  There is no exposed bone,  tendon, or muscle.  There is no foul odor and a large amount of  serosanguineous discharge.  There is moderate callus buildup around the  ulcer.   ASSESSMENT:  Slowly healing neuropathic ulcer from diabetes mellitus of  the left foot with total contact casting.   PLAN:  As his feet are insensate, he did not require a topical  anesthesia.  A #15 scalpel blade was used to selectively debride the  heavy callus around the ulcer which was tolerated well without  significant discomfort or bleeding.  Following this, the ulcer was  covered with a silver alginate dressing, covered by a Iodoform, and then  a total contact cast.  He will continue to have weekly changes of his  dressing and total contact cast and should have a physician reevaluation  in approximately 2 weeks.           ______________________________  Barry Dienes Eloise Harman, M.D.     DGP/MEDQ  D:  08/02/2008  T:  08/02/2008  Job:  [id number]

## 2010-12-18 NOTE — Assessment & Plan Note (Signed)
[facility]   NAME:  Nicholas [name], Nicholas [name]              ACCOUNT NO.:  [id number]   MEDICAL RECORD NO.:  [id number]      DATE OF BIRTH:  May 13, 1941   PHYSICIAN:  Leonie Man, M.D.    VISIT DATE:  11/01/2008                                   OFFICE VISIT   PROBLEMS:  1. Charcot foot status post treatment in total contact cast and now      with ulcer healed.  The patient is being scheduled for offloading      boot by [employer].  2. Recent current recurrent sacral decubitus ulcer of the buttock.      The patient has a 3-year history of a prior sacral decubitus ulcer.      This has reopened in his buttock.   HISTORY:  The patient is a 70 year old man who is morbidly obese and  diabetic, previously treated for his Charcot foot by total contact  casting.  On evaluation today after his cast has been removed, he does  seem to have a decubitus ulcer of the buttock area.   PHYSICAL EXAMINATION:  GENERAL:  On examination, the patient is alert,  cooperative.  VITAL SIGNS:  Temperature 98.2, pulse 66, respirations 20, blood  pressure 135/72.  His capillary blood glucose today is 113.  EXTREMITIES:  Examination limited to his foot and buttock area, shows  the foot wound to be completely healed without any overlying erythema.  The buttock wound is examined and is noted to be 0.5 x 0.7 x 0.3 cm with  surrounding area of callus formation.  The base of the wound shows clean  granulating tissues.   PLAN/TREATMENT:  Today, hydrogel dressings to decubitus ulcer and being  scheduled for [employer] for an offloading boot for his Charcot foot.      Leonie Man, M.D.  Electronically Signed     PB/MEDQ  D:  11/01/2008  T:  11/02/2008  Job:  [id number]

## 2010-12-18 NOTE — Assessment & Plan Note (Signed)
[facility]   NAME:  Nicholas [name], Nicholas [name]              ACCOUNT NO.:  [id number]   MEDICAL RECORD NO.:  [id number]      DATE OF BIRTH:  26-Apr-1941   PHYSICIAN:  Barry Dienes. Eloise Harman, M.D. VISIT DATE:  03/14/2009                                   OFFICE VISIT   SUBJECTIVE:  The patient is a 70 year old man with a sacral decubitus  ulcer seen for followup.  He also had diabetes mellitus, type 2 and  exogenous obesity.   OBJECTIVE:  VITAL SIGNS:  Blood pressure 131/67, pulse 53, respirations  20, and temperature 98.8.  Most recent capillary blood glucose level was  121.  The condition of the wound is as follows:  Wound #4:  This overlies the sacrum and measures 1.5 cm x 3.0 cm x 0.1  cm.  This wound does not have significant eschar but has minimal  necrotic debris with pale pink wound base and pink granulation tissue.  There was no exposed bone, tendon, or muscle and no foul odor.  There  was a small amount of serous drainage and there was some macerated  tissue around the wound.   ASSESSMENT:  Interval stagnation of wound healing, likely from  persistent fungal infection within the wound causing skin breakdown with  maceration.   PLAN:  No debridement was done today.  The wound was treated with soap  and water wash, followed by antifungal cream with KY gel and Promogran  with Desitin around the edges covered by gauze.  His wife will repeat  this treatment using Lamisil 1% cream first debriding the wound with a  washcloth once daily.  We will have a physician reevaluation in  approximately 2 weeks.           ______________________________  Barry Dienes Eloise Harman, M.D.     DGP/MEDQ  D:  03/14/2009  T:  03/15/2009  Job:  [id number]

## 2010-12-18 NOTE — Discharge Summary (Signed)
NAME:  Nicholas [name], Nicholas [name] NO.:  [id number]   MEDICAL RECORD NO.:  [id number]          PATIENT TYPE:  INP   LOCATION:  5041                         FACILITY:  [facility]   PHYSICIAN:  Candyce Churn, M.D.DATE OF BIRTH:  06-30-1941   DATE OF ADMISSION:  12/26/2008  DATE OF DISCHARGE:  12/29/2008                               DISCHARGE SUMMARY   DISCHARGE DIAGNOSES:  1. Left lower extremity cellulitis, improved with left foot 1 cm ulcer      with granulation tissue, improved.  2. Multiple concomitant diagnoses including the following:  3. Type 2 diabetes mellitus.  4. Chronic lower extremity leg edema.  5. History of coronary artery disease, status post coronary artery      bypass graft.  6. Obstructive sleep apnea.  7. Glaucoma.  8. Schizophrenia.  9. Depression.  10.History of decubitus ulcer of the sacrum.  11.Degenerative joint disease of the knees.  12.Hyperlipidemia.  13.History of methicillin-resistant Staphylococcus aureus.  14.Obesity.  15.Prostatic hypertrophy.  16.History of remote stroke.   DISCHARGE MEDICATIONS:  1. Plavix 75 mg daily.  2. Lantus 6 units subcu nightly.  3. Nexium 40 mg daily.  4. Allopurinol 300 mg daily.  5. Lasix 80 mg daily.  6. Temazepam 30 mg p.o. nightly.  7. Xanax 1 mg b.i.d.  8. Imdur 30 mg daily.  9. Atenolol 50 mg b.i.d.  10.Potassium 50 mEq p.o. t.i.d.  11.Xalatan eye drops 0.005% one drop in each eye at bedtime.  12.Patanol 0.1% one drop in each eye b.i.d.  13.Combigan 1 drop to left eye b.i.d.  14.Divalproex 250 mg p.o. nightly.   HOSPITAL COURSE:  [name] is a pleasant 70 year old male who  presented to [facility] emergency room on Dec 26, 2008, with a history  of chronic left foot ulcer followed by the [facility].  It had been  swollen and red for about a month with waxing and waning and redness and  he was seen by Orthopedics today and was referred to the emergency room.  He was found to have  left lower extremity cellulitis and admitted.  He  had marked improvement of the left lower extremity cellulitis with Zosyn  and vancomycin intravenously provided for 4 days and he was discharged  home on Dec 29, 2008, with marked reduction, erythema, and swelling of  the foot and improvement of the small foot ulcer.  He was discharged  home on Augmentin for a 10-day course of 875 mg b.i.d.   CONDITION ON DISCHARGE:  Much improved.   MRI of the left foot without contrast on Dec 27, 2008, revealed no  osteomyelitis in the foot with extensive dorsal subcutaneous edema and  cellulitis.  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   Other laboratories at discharge include white count 6300; hemoglobin  12.9; platelet count 196,000.  Sodium 146, potassium 3.8, chloride 104,  bicarb 37, BUN 19, creatinine 1.16, glucose 57, total protein 5.9,  albumin 22.7, AST 14, ALT 8, alk phos 72, total bili  0.5, hemoglobin A1c  6.3%, total cholesterol 146, triglyceride 175, HDL 28, LDL cholesterol  83.   C-reactive protein was 2.0 slightly elevated at admission.  Blood  cultures x2 drawn on Dec 27, 2008, revealed no growth at 5 days.      Candyce Churn, M.D.  Electronically Signed     RNG/MEDQ  D:  03/20/2009  T:  03/21/2009  Job:  [id number]

## 2010-12-18 NOTE — Consult Note (Signed)
NAME:  JAMAUL, HEIST NO.:  [id number]   MEDICAL RECORD NO.:  [id number]          PATIENT TYPE:  REC   LOCATION:  FOOT                         FACILITY:  [facility]   PHYSICIAN:  Barry Dienes. Eloise Harman, M.D.DATE OF BIRTH:  02-Mar-1941   DATE OF CONSULTATION:  DATE OF DISCHARGE:                                 CONSULTATION   PHYSICIAN FOR REQUESTING CONSULTATION:  Lubertha Basque. Dalldorf, MD   INDICATION FOR CONSULTATION:  Evaluation of nonhealing ulcer of left  foot.   HISTORY OF PRESENT ILLNESS:  The patient is a 70 year old man with a  nickname of Chief, who has had a left midfoot ulcer off and on for the  past 10 years.  For the past 2 months, the ulcer has been present.  He  has been followed by Dr. Jerl Santos and treated with several rounds of  antibiotics as well as topical iodine.  He recalls no history of a  Charcot foot with casting.  He has diabetes mellitus custom shoes, which  he does not wear for unclear reasons.  He denies claudication symptoms,  but does note that both feet are numb most of the time.  He does have  aching discomfort in the left foot and chronic moderate pain in the  right knee.  His right knee had a MRSA infection in the past.  Several  orthopedist have evaluated him and felt that total knee replacement  would have a high likelihood of infection with MRSA, so he is not  considered a surgical candidate for this.   PAST MEDICAL HISTORY:  Diabetes mellitus, type 2 for at least the past 7  years and complicated by neuropathy and retinopathy.  He has no history  of nephropathy.  He also has hyperlipidemia, coronary artery disease  status post bypass surgery, gout, chronic venous insufficiency (left  greater than right), anxiety, and claustrophobia, insomnia, COPD on  nasal cannula oxygen at bedtime, CVA with residual right-sided weakness,  and MRSA infection of the right knee.   MEDICATIONS:  1. Zaroxolyn 5 mg p.o. every Monday, Wednesday, and  Friday.  2. Glipizide extended release 5 mg p.o. q.a.m.  3. Plavix 75 mg daily.  4. Simvastatin 20 mg daily.  5. Omeprazole 20 mg twice daily.  6. Imdur 60 mg take 1/2 tablet daily.  7. Allopurinol 300 mg daily.  8. Atenolol 50 mg twice daily.  9. Terazosin 5 mg nightly.  10.Lasix 80 mg 2 tablets every morning.  11.Potassium chloride 20 mEq 3 tablets taken three times daily.  12.Lantus insulin 60 units subcutaneous nightly.  13.Xalatan 0.005% 1 drop each eye nightly.  14.Patanol 0.1% one drop each eye twice daily.  15.Lumigan 1 drop in left eye twice daily.  16.Vicodin 5/500 one tablet p.o. 5 times daily.  17.Xanax 1 mg take 1 tablet in the morning and 2 tablets at bedtime.  18.Restoril 30 mg p.o. nightly.  19.Depakote 250 mg nightly.  20.Perphenazine 8 mg nightly.   PAST SURGICAL HISTORY:  Left foot surgery for removal of a benign tumor  approximately 10 years ago, remote cholecystectomy and appendectomy in  1994,  coronary artery bypass graft surgery in 2003, coronary artery  bypass graft surgery redo, left knee arthroscopic surgery, right and  left elbow surgery approximately 5 years ago, laser treatments to both  eyes, low back surgery x6 due to degenerative disk disease.   ALLERGIES:  No known drug allergies.   SOCIAL HISTORY:  He is married and lives with his wife.  He is a retired  Actor with the [employer]   REVIEW OF SYSTEMS:  His weight has been steady.  He has not had recent  fever or chills.  He denies dyspnea, chest pain, palpitations,  constipation, symptoms of benign prostatic hypertrophy, or current  anxiety.  He has mild depression related to his disabilities.  He has  chronic moderate pain in the right knee that briefly improves when he is  given cortisone injection.  He has mild weakness in the right side from  his past stroke.  He walks with a walker.   INITIAL PHYSICAL EXAMINATION:  VITAL SIGNS:  Blood pressure 117/58,  pulse 60,  respirations 20, temperature 98.2, most recent capillary blood  glucose level was 114.  GENERAL:  He is a mildly overweight man who is in no apparent distress  while lying partially upright.  HEAD, EYES, EARS, NOSE, AND THROAT:  Within normal limits.  NECK:  Without jugular venous distention or carotid bruit.  CHEST:  Clear to auscultation, heart had a regular rate and rhythm.  ABDOMEN:  Benign.  EXTREMITIES:  Bilateral 2+ leg edema (left greater than right).  The  bilateral pedal pulses were barely palpable and easily obtained via  Doppler ultrasound exam.  The right foot has a normal gross shape.  The  left foot has collapse of the medial arch.  At the apex of the collapse  area is an ulcer.  The ulcer measures 2.5 cm x 2.5 cm x 0.2 cm.  There  was some undermining of approximately 0.5 cm from the 3 o'clock through  12 o'clock position.  The ulcer did extend down to the underlying  muscle.  There was no significant eschar and a minimal amount of  necrotic debris.  The wound base is red in color and some red  granulation tissue.  There was no exposed bone, tendon, or ligament and  no foul odor.  There was a moderate amount of serosanguineous discharge  and the periwound integrity was intact.  The temperature of the left  foot dorsum is 87.5 degrees Fahrenheit and on the right side, it was  82.7 degrees Fahrenheit.   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  This wound does not appear to  extend into the deep tissues.  He would do better if we could offload  the ulcer better.   PLAN:  After informed consent was obtained, I used a #15 scalpel to  debride the necrotic material from the center of the ulcer and from the  surrounding tissues.  This was done without significant pain and only  minimal bleeding that resolved with sparing use of silver  nitrite  topically.  Following this, the ulcer was covered with silver alginate  covered by SofSorb, and then a total contact cast was applied.  He was  advised to schedule a cast removal and wound recheck by nursing staff in  approximately 1 weak and once weekly and have a physician reevaluation  in approximately  3 weeks.           ______________________________  Barry Dienes Eloise Harman, M.D.     DGP/MEDQ  D:  07/19/2008  T:  07/19/2008  Job:  [id number]   cc:   Lubertha Basque. Jerl Santos, M.D.  Candyce Churn, M.D.  Georga Hacking, M.D.

## 2010-12-21 NOTE — Consult Note (Signed)
[facility]  Patient:    Nicholas [name], Nicholas [name] Visit Number: [id number] MRN: [id number]          Service Type: PMG Location: [facility] Attending Physician:  Sondra Come Dictated by:   Sondra Come, D.O. Proc. Date: 04/16/01 Admit Date:  04/09/2001                            Consultation Report  FOLLOWUP VISIT:  The patient returns to clinic today for followup visit.  He was initially seen one week ago.  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 He was given a prescription last week for a four-wheel multi-breaking walker. Patient states that he borrowed a similar-type walker and was unable to get much relief with it.  He states that he has to lean too far forward and is unable to do that with the particular walker; he is able to do this, however, by leaning his whole abdomen over a shopping cart.  He has had epidural steroid injections in the past which have given him some relief of his neurogenic claudication.  At his last visit, I was hesitant to set him up for such a procedure secondary to patient not bringing his extensive medication list with him.  Today, he returns to clinic with his medication list which includes Plavix, Prevacid, Lotensin, perphenazine, alprazolam, desipramine, carbamazepine, Cardizem, isosorbide, Lipitor, atenolol, furosemide, Claritin, Glucotrol, hydrocodone, Neurontin, Zaroxolyn, Actos and several eye drops. Patient states that he takes hydrocodone every four hours as needed and just recently got a new prescription for this yesterday.  He is also taking 300 mg of Neurontin three times a day.  Health and history form and 14-point review of systems were reviewed.  There are essentially no changes since one week ago.  Patients current pain is a 5/10 on a subjective scale.  His sleep is great.  EXAMINATION:  NEUROLOGIC:  Gait continues to be mildly  antalgic.  Manual muscle testing is 5/5, bilateral lower extremities.  Sensory exam reveals decreased light touch in a stocking distribution bilaterally.  Muscle stretch reflexes are 1+/4, bilateral patellar; 0/4, bilateral medial hamstrings and Achilles.  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  PLAN: 1. Lumbar epidural steroid injection.  We will need to get permission from    patients primary Mancil Pfenning, Dr. Pearla Dubonnet, for patient to come    off of Plavix seven days prior to the procedure.  Patient may need to be    off of this for an extended period of time if a second or potentially third    lumbar epidural steroid injection is anticipated. 2. Continue current medications as per primary Capone Schwinn. 3. If patient does not receive any benefit from epidural steroid injections,    would consider potentially looking into other forms of mobility such as a    wheelchair or scooter for the patient to improve his quality of life and    community mobility.  Patient was educated in the above findings and recommendations and understands.  There were no barriers to communication. Dictated by:   Sondra Come, D.O. Attending Physician:  Sondra Come DD:  04/16/01 TD:  04/17/01 Job: [id number]

## 2010-12-21 NOTE — Assessment & Plan Note (Signed)
[facility]   NAME:  Nicholas [name], Nicholas [name]              ACCOUNT NO.:  [id number]   MEDICAL RECORD NO.:  [id number]           DATE OF BIRTH:   PHYSICIAN:  Theresia Majors. Tanda Rockers, M.D. VISIT DATE:  10/02/2006                                   OFFICE VISIT   VITAL SIGNS:  Blood pressure is 141/68, respirations 18, pulse rate 64,  temperature 97.8, capillary blood glucose is 106 mg percent.   PURPOSE OF TODAY'S VISIT:  Nicholas [name] is a 70 year old man we have  followed for a sacral decubitus ulcer for several months.  We have  treated him with serial debridements, and most recently we have started  him on a Regranex protocol.   WOUND EXAM:  Inspection of the sacral area shows that the ulcer bed is  100% granulated.  There is no exudate, no malodor, no drainage.   WOUND SINCE LAST VISIT:  During the interim, he continues to take local  baths per his wife who assists him.  There has been no noticeably a  decrease in size and no significant drainage.  There has been no  excessive pain or malodor.   DIAGNOSIS:  Clinical improvement of the wound.   MANAGEMENT PLAN & GOAL:  We will continue the Regranex protocol once  daily.  We will reevaluate the patient in one month.  The patient has  informed me that he plans to take a trip to [place] to [facility]  to be evaluated for a knee replacement.  We have advised him to make  sure that he takes enough pillows to avoid prolonged pressure on the  sacral area during the trip.  He will be riding in a conversion van, and  hopefully he will avoid prolonged sitting on the area of injury.  We  will reevaluate him in one month.  We have renewed his prescription for  Regranex.           ______________________________  Theresia Majors Tanda Rockers, M.D.     Nicholas [name]  D:  10/02/2006  T:  10/03/2006  Job:  [id number]

## 2010-12-21 NOTE — Consult Note (Signed)
[facility]  Patient:    Nicholas [name], Nicholas [name] Visit Number: [id number] MRN: [id number]          Service Type: DSU Location: [facility] [id number] Attending Physician:  Tommy Medal Dictated by:   Sondra Come, D.O. Proc. Date: 06/24/01 Admit Date:  06/18/2001 Discharge Date: 06/18/2001   CC:         Lajuana Matte, M.D., [facility], [address]  [address], [place], [place]  [address]   Consultation Report  REASON FOR FOLLOWUP:  The patient returns to clinic today for reevaluation. He has undergone three lumbar epidural steroid injections in September and October of 2001.  He states that overall he feels better.  He states he is able to stand longer at church and is able to go shopping with his wife as long as he can lean forward over a shopping cart.  His endurance seems to be improved.  His pain today is 3/10 on a subjective scale.  He states that he is no longer having pain radiating into his lower extremities but he still has some radiating into his buttock from his low back when he walks.  He denies any bowel and bladder dysfunction.  His function and quality-of-life indices have significantly improved.  He continues to take hydrocodone 5 mg with 500 mg of Tylenol three to five times daily as needed for pain.  He also continues to take Neurontin 300 mg in the morning and the afternoon and 600 mg at bedtime, which he states is helping.  It is also noted that he takes carbamazepine.  He states he is taking this for pain.  I review health and history form and 14-point review of systems.  No new neurologic complaints.  PHYSICAL EXAMINATION:  GENERAL:  Physical examination reveals a healthy male in no acute distress.  VITAL SIGNS:  Blood pressure 126/74, pulse 78, respirations 18, O2 saturation 94%.  NEUROMUSCULAR:  Gait is normal.  Mood and affect are appropriate.  Examination of the back reveals decreased lumbar lordosis with  midline vertical incisional scar.  Flexion is full without pain.  Extension is mildly limited with pain on end-range.  Rotation and side-bending are symmetric without discomfort. Manual muscle testing is 5/5, bilateral lower extremities.  Sensory examination reveals decreased light touch in a stocking distribution bilaterally.  Muscle stretch reflexes are 1+/4, bilateral patellar; 2+/4, bilateral medial hamstrings; and 0/4, bilateral Achilles.  IMPRESSION: 1. 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 2. 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 3. Facet arthropathy. 4. Status post spine surgery x 6. 5. Polypharmacy.  PLAN: 1. Continue current medications as prescribed.  Asked the patient to discuss    with Dr. Reymundo Poll. Lugo the continued need for carbamazepine if it is    being used for pain, especially neuropathic pain.  If this is the case, we    should consider weaning him from that and titrating his Neurontin to    effect. 2. Patient to return to clinic in three months or sooner as needed for    reevaluation.  Patient was educated in the above findings and recommendations and understands.  There were no barriers to communication. Dictated by:   Sondra Come, D.O. Attending Physician:  Tommy Medal DD:  06/24/01 TD:  06/24/01 Job: [id number]

## 2010-12-21 NOTE — Assessment & Plan Note (Signed)
[facility]   NAME:  Nicholas [name], Nicholas [name]              ACCOUNT NO.:  [id number]   MEDICAL RECORD NO.:  [id number]      DATE OF BIRTH:  1941/06/05   PHYSICIAN:  Jake Shark A. Tanda Rockers, M.D. VISIT DATE:  01/03/2006                                     OFFICE VISIT   VITAL SIGNS:  Blood pressure 142/78, pulse rate 72, respirations 20.  He is  afebrile.   PURPOSE OF TODAY'S VISIT:  [name] returns for followup of a sacral  decubitus that has been present for over a year.  We have treated him with  various modalities of debridement.  He has intermittently grown a  methicillin-resistant Staph and has completed several courses of doxycycline  and sulfa.  Currently, he is having his wound managed by his wife at home  with a moist dressing daily.  Since his last visit, he has had decrease in  drainage, no malodor and no pain.   WOUND EXAM:  The sacral wound is to the left of the midline and oriented  toward the ischium.  The measurements are 0.8 x 1 with a sinus that extends  approximately 1.2 cm.  There is minimal drainage.  Recent MRI has suggested  changes consistent with an osteo in the deeper levels, but this is  indeterminate.   WOUND SINCE LAST VISIT:   CHANGE IN INTERVAL MEDICAL HISTORY:   DIAGNOSIS:  Slow-healing chronic wound.   TREATMENT:   ANESTHETIC USED:   TISSUE DEBRIDED:   LEVEL:   CHANGE IN MEDS:   COMPRESSION BANDAGE:   OTHER:   MANAGEMENT PLAN & GOAL:  We have initiated Regranex protocol.  We have  instructed the wife in the application of the Regranex and provided them  with a sample and instruction.  We have indicated also that if this Regranex  protocol does not result in healing, we would recommend that we proceed with  insurance precertification for hyperbaric oxygen treatment.  We will  reevaluate the patient in two weeks to assess his response to therapy.           ______________________________  Theresia Majors. Tanda Rockers, M.D.     [name]  D:  01/03/2006  T:  01/04/2006  Job:  [id number]

## 2010-12-21 NOTE — Op Note (Signed)
[place]  Patient:    Nicholas [name], Nicholas [name] Visit Number: [id number] MRN: [id number]          Service Type: DSU Location: [facility] 2899 41 Attending Physician:  Tommy Medal Dictated by:   Doris Cheadle Dione Booze, M.D. Proc. Date: 06/18/01 Admit Date:  06/18/2001   CC:         Norva Pavlov, M.D.   Operative Report  INDICATIONS AND JUSTIFICATIONS FOR THE PROCEDURE:  [name] has been followed in my office with severe glaucoma and has had cataract surgery. He has had multiple procedures and uses multiple medications for his disease. He has deep optic nerve cupping on the right and moderate cupping on the left. The pressure had been running in the high teens to low 20s in the right eye and usually is around 16 to 17 in the left eye.  He presently takes Xalatan, Alphagan and Trusopt for his glaucoma.  On May 28, 2001 the pressure was 24 in the right eye and 16 on the left and the patient was 20/30 in each eye. Visual field test was reduced in the right and fairly full on the left.  The pupils are irregular and the motility is normal.  He does have ptosis bilaterally with the right being worse than the left but both actually come to the pupil.  Conjunctivae and corneas normal.  The anterior chamber is normal. At the slit lamp, he has a lens implant in the right eye and a cataract in the left eye.  He feels that the lid blocks the vision in the right eye and he has to hold the lid up physically with his finger in order to see.  This particularly bothers him with reading.  He had severe visual field loss, so a formal field does not demonstrate this loss; however, what happens is the lid actually covers the pupil so the patient does not see at all with his right eye and he is more comfortable reading with both eyes together.  Photographs do show the ptosis in both eyes.  Medically, he should be stable for having this procedure in the  minor surgery room at [facility]. He is having a right ptosis repair in order to see better.  JUSTIFICATION FOR PERFORMING PROCEDURE IN OUTPATIENT SETTING:  Routine.  JUSTIFICATION FOR OVERNIGHT STAY:  None.  PREOPERATIVE DIAGNOSIS:  Severe ptosis with visual impairment.  POSTOPERATIVE DIAGNOSIS:  Severe ptosis with visual impairment.  OPERATION PERFORMED:  Ptosis repair of the right upper eyelid.  SURGEON:  Robert L. Dione Booze, M.D.  ANESTHESIA:  1% Xylocaine with epinephrine.  DESCRIPTION OF PROCEDURE:  The patient arrived in the minor surgery room at [facility] and was prepped and draped in the routine fashion.  A frontal nerve block consisting of 1% Xylocaine with epinephrine was given and this was supplemented with additional anesthesia to the lid fold area of the upper lid on the right.  The lid was everted and a curved hemostat was used to clamp the tarsus carefully and this clamp included some conjunctiva.  A clamp was then placed temporally also and the tips of the clamps med.  A stab incision was made temporally at the end of the temporal clamp behind the clamp to make a site where the needle could enter.  Next, using 6-0 chromic gut a suture was passed back and forth behind the two clamps nasally.  Once the suture had  reached nasally, the nasal clamp was removed and the tissue that had been clamped was excised.  The suture was then run back half way.  Next, the temporal clamp was removed and further tissue was excised.  The suture was then run back to the starting point and tied so that the knot was buried in the original incision.  Next, Polysporin ointment was used and the eye was patched.  The patient then left the minor room and has done well.  FOLLOW-UP:  The patient will be seen in my office in several days if there is severe pain.  He is to be seen otherwise in about one and one half weeks.  He is to remove the patch the  next morning and will use warm compresses twice daily and polysporin ointment twice daily.  He is to continue his glaucoma medication. Dictated by:   Doris Cheadle. Dione Booze, M.D. Attending Physician:  Tommy Medal DD:  06/18/01 TD:  06/18/01 Job: [id number]

## 2010-12-21 NOTE — Discharge Summary (Signed)
NAME:  Nicholas [name], Nicholas [name] NO.:  [id number]   MEDICAL RECORD NO.:  [id number]          PATIENT TYPE:  INP   LOCATION:  5012                         FACILITY:  [facility]   PHYSICIAN:  Candyce Churn, M.D.DATE OF BIRTH:  Feb 16, 1941   DATE OF ADMISSION:  02/19/2006  DATE OF DISCHARGE:  02/22/2006                                 DISCHARGE SUMMARY   DISCHARGE DIAGNOSES:  1.  Right lower lobe pneumonia, improved.  2.  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  3.  Type 2 diabetes mellitus.  4.  Hypertension.  5.  Gastroesophageal reflux disease.  6.  Gout.  7.  Coronary artery disease.  8.  Hyperlipidemia.  9.  Congestive heart failure.  10. History of Schizophrenia.  11. Depression.  12. Anxiety.  13. Glaucoma.  14. 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  15. Benign prostatic hypertrophy.   DISCHARGE MEDICATIONS:  1.  Plavix 5 mg daily.  2.  Glucotrol 5 mg daily.  3.  Lantus 32 units sub-q q.h.s.  4.  Flomax 0.4 mg b.i.d.  5.  Cartia XT 240 mg daily.  6.  Nexium 40 mg daily.  7.  Allopurinol 300 mg daily.  8.  Atenolol 50 mg b.i.d.  9.  Imdur 60 mg - 1/2 tablet daily.  10. Lipitor 10 mg a day.  11. Lasix 40 mg b.i.d.  12. Metolazone 5 mg Monday, Tuesday, and Wednesday.  13. Potassium 20 mEq t.i.d.  14. Depakote 250 mg p.o. q.h.s.  15. Perphenazine 8 mg - 2 at bedtime or 16 mg p.o. q.h.s.  16. Temazepam 30 mg p.o. q.h.s.  17. Alprazolam 1 mg - 2 in the morning and one in the evening.  18. Hydrocodone 5/500 one p.o. q.i.d. for DJD of the knees.  19. Xalatan 0.5% one drop in each eye daily.  20. Beta optic 0.25% one drop in each eye q. 12 hours.  21. Patanol 0.1% one drop in each eye q. 12 hours.  22. Azithromycin 250 mg p.o. daily for 7 days.  23. Ceftin 500 mg p.o. b.i.d. for 7 days.   HOSPITAL COURSE:  Mr. Septer was admitted on February 19, 2006 for fever and  was found to have right lower lobe pneumonia.  He responded very quickly to  IV Rocephin and azithromycin and after 24 hours was changed to p.o. Ceftin  and azithromycin with O2 saturations 88 to 92% on room air with ambulation.  He is not short of breath on discharge.  He has no cough and feeling very  well in general.   The patient did have speech therapy evaluation and though he can continue  with a regular consistency diet, he should practice sitting straight up,  taking small bites, and using chin tuck for swallowing.   Will plan to see back in the office in two weeks for follow up and p.r.n.   X-ray on admission revealed right base air space opacity consistent with  pneumonia and repeat chest x-ray on February 21, 2006 revealed right lower air  space disease had resolved and he had a very minimal amount of left base  atelectasis.  He had cardiomegaly but there was no evidence of persistent  pneumonia or CHF.   A swallowing function test was performed on February 21, 2006 with the above  recommendations for dietary consistency.   Discharge laboratories include the following:  White cell count 9,100,  hemoglobin 13.3, platelet count 202,000.  White count on admission was  15,700 with 83% neutrophils.   Discharge BMP includes sodium 141, potassium 3.2, chloride 102, bicarb 32,  glucose 137, BUN 16, creatinine 1.0.  LFTs were normal on February 21, 2006,  except for a mildly depressed albumin of 2.7.  Calcium on February 22, 2006, was  normal at 8.5.  Hemoglobin A1c on February 21, 2006, was 6.9% - exhibiting good  diabetic control.  A cholesterol profile on February 21, 2006, revealed an LDL  of 59, an HDL of 32 with a total cholesterol and an HDL ratio of 3.5.  TSH  was 0.383 - within normal limits and B12 level was 364 - within normal  limits.  Urinalysis on admission - February 20, 2006 - was within normal limits  without glucose, protein, blood, or white cells.   The patient was  discharged home in improved condition and will follow up in  the office in two weeks.      Candyce Churn, M.D.  Electronically Signed     RNG/MEDQ  D:  02/22/2006  T:  02/22/2006  Job:  [id number]

## 2010-12-21 NOTE — Assessment & Plan Note (Signed)
[facility]   NAME:  Nicholas [name], Nicholas [name]              ACCOUNT NO.:  [id number]   MEDICAL RECORD NO.:  [id number]      DATE OF BIRTH:  1941-08-04   PHYSICIAN:  Jake Shark A. Tanda Rockers, M.D. VISIT DATE:  02/06/2006                                     OFFICE VISIT   VITAL SIGNS:  His vital signs are stable.  He is afebrile.   PURPOSE OF TODAY'S VISIT:  [name] is a 70 year old man who has been  followed with a nonhealing sacral ulcer.  He has had significant  comorbidities including chronic congestive heart failure and ischemic heart  disease.  His cardiac status would not allow him to undergo hyperbaric  oxygen treatment.  In the interim, we have used Regranex.  He denies fever.  His wife is suspicious that his wound is larger.   WOUND EXAM:  Inspection of the sacral wound shows that there is a halo of  desquamated skin and necrosis toward the center.  This area was full  thickness debrided with hemorrhage control with direct pressure.  There was  no abscess or extension onto the bone at this point.  There appears to be  healthy granulation tissue.   DIAGNOSIS:  Enlargement of the wound but no evidence of extension in depth.   MANAGEMENT PLAN & GOAL:  1.  We will continue using the Regranex protocol.  2.  We will re-evaluate this wound in 1 week.           ______________________________  Theresia Majors. Tanda Rockers, M.D.     [name]  D:  02/06/2006  T:  02/06/2006  Job:  [id number]

## 2010-12-21 NOTE — Discharge Summary (Signed)
NAME:  Nicholas [name], Nicholas [name] NO.:  [id number]   MEDICAL RECORD NO.:  [id number]                   PATIENT TYPE:  INP   LOCATION:  0354                                 FACILITY:  [facility]   PHYSICIAN:  Candyce Churn, M.D.          DATE OF BIRTH:  1941/03/12   DATE OF ADMISSION:  10/02/2002  DATE OF DISCHARGE:  10/05/2002                                 DISCHARGE SUMMARY   DISCHARGE DIAGNOSES:  1. Acute renal failure, resolved.  2. Dehydration, resolved.  3. Probable gastroenteritis causing dehydration and acute renal failure,     resolved.  4. Type 2 diabetes mellitus.  5. Coronary artery disease.  6. Hypertension.  7. Gout.  8. Glaucoma.  9. Sleep apnea.  10.      Cerebrovascular disease with prior stroke.  11.      Status post left subdural hematoma.  12.      Bipolar disorder.  13.      Hypercholesterolemia.  14.      Gastroesophageal reflux disease.   DISCHARGE MEDICATIONS:  As per HPI, except he will be reducing Lasix to 40  mg twice a day from 80 mg twice a day.   DISCHARGE DIET:  He will be discharged on a 2000 calorie ADA diet.   HOSPITAL COURSE:  ACUTE RENAL FAILURE.  The patient is a very pleasant 70-  year-old [place] male with multiple medical problems who had right eye  surgery for glaucoma per Dr. Dione Booze apparently 2 days prior to admission.  He  then ate an Egg McMuffin and shortly after that developed nausea, vomiting,  and diarrhea.  He was seen on the day prior to admission by myself - Dr.  Ophelia Charter at [facility] and felt to have possible bacterial  enteritis and was given ciprofloxacin and Phenergan.  He did not improve at  home and was not able to hold down liquids and continued to have nausea and  vomiting and was admitted on 10/02/2002 by Dr. Pete Glatter with fevers as high  as 101.7.  He complained of decreased urination for about 24 hours.   His usual BUN is in the range of 25 and was 68 on admission.   Creatinine  which usually runs about 1.5 was 5.0.  Bicarb on admission was 28, white  cell count was 5800, hemoglobin was 16.4, platelet count was 277,000.   Sodium was 135, potassium 3.9, chloride 94.  Calcium 8.9 and albumin 3.8.   The patient was felt to be in acute prerenal renal failure and was hydrated  vigorously with 1/2-normal saline at 200 per hour.  His usual medications  were started except for diuretics.   (Please see H&P per Dr. Pete Glatter for his very long list of medications at  baseline.)   Soon after we began hydration his nausea and vomiting resolved, and he  started to make urine quite well with a urine output on  the second day of  admission of 4 L.   His creatinine improved daily with a creatinine on 10/03/2002 up to 3.6 down  from 5.0 on 10/02/2002, creatinine of 2.4 was noted later in the day of  10/03/2002 and a creatinine of 1.6 was noted on the day before discharge on  10/04/2002.   The patient was ambulating on the day prior to admission to make sure that  he was physically stable on his feet.  He was discharged on 10/05/2002 home  in stable and improved condition.  He was eating well and without complaint.  He is to be followed up in my office in 1 week.                                               Candyce Churn, M.D.    RNG/MEDQ  D:  10/20/2002  T:  10/20/2002  Job:  [id number]

## 2010-12-21 NOTE — Consult Note (Signed)
NAME:  MITHRAN, STRIKE NO.:  [id number]   MEDICAL RECORD NO.:  [id number]          PATIENT TYPE:  INP   LOCATION:  5016                         FACILITY:  [facility]   PHYSICIAN:  Jimmye Norman III, M.D.  DATE OF BIRTH:  26-Apr-1941   DATE OF CONSULTATION:  06/08/2004  DATE OF DISCHARGE:                                   CONSULTATION   REFERRING PHYSICIAN:  Dr. Rockey Situ. Roxan Hockey.   Dear Dr. Roxan Hockey,   Thank you for asking me to see Mr. Jadarian Mckay, a pleasant 70 year old  gentleman who, because of multiple unfortunate circumstances, has ended up  having a large, probably 25 x 30 x 3-1/2 to 4-inch deep sacral ulcer.  He is  an obese male and apparently because of being bedridden secondary to  complications surrounding an infection in his knee, has developed this large  ulcer.  In spite of debridement at the bedside, he has continued to spike  fevers up to 103 and I was asked to see him because of continued fevers, and  foul-smelling drainage from the wound.   I did not explore the patient's extensive past medical history, although I  did talk to him about the most recent things leading to his hospitalization.  On looking at his sacral decubitus, there was, as described, being a very  large crater in the sacral area with a large pocket and pieces of necrotic  muscle and fascial tissue.  There were no large pockets of purulent drainage  that could be drained and no planes of dissection fasciitis that needed to  be opened up in the operating room, however, I was able to extensively  debride a large amount of necrotic muscle, subcutaneous tissue and fascia at  the bedside.  Two specimens of tissue were sent for aerobic and anaerobic  culture; this had a very strong anaerobic smell, parts of which actually  looked like stool as it had necrotic muscle in it.   The patient needs more aggressive debridement therapy and also more frequent  dressing changes, in addition to  anaerobic coverage.  He has recently been  started on Primaxin for the anaerobic coverage and I have increased his  dressing change frequency up to q.8 h.  I will see him again tomorrow with  perhaps another need to debride him, but he should be at least packed twice  a day with saline wet-to-dry dressings.  Although not clearly of a favorite  to his  institution, I do believe that Dakin's dressing would be helpful in this  situation for initial cleanout for at least the first 48-72 hours.   Sincerely,       JW/MEDQ  D:  06/08/2004  T:  06/09/2004  Job:  [id number]   cc:   Candyce Churn, M.D.  [address]  [place] [address]  Fax: [phone]   Rockey Situ. Flavia Shipper., M.D.  [address]  [place]  [place] [address]  Fax: [phone]

## 2010-12-21 NOTE — Discharge Summary (Signed)
NAME:  Nicholas [name], Nicholas [name] NO.:  [id number]   MEDICAL RECORD NO.:  [id number]                   PATIENT TYPE:  INP   LOCATION:  2019                                 FACILITY:  [facility]   PHYSICIAN:  Kerin Perna, M.D.               DATE OF BIRTH:  1941-04-30   DATE OF ADMISSION:  03/18/2002  DATE OF DISCHARGE:  04/02/2002                                 DISCHARGE SUMMARY   ADMISSION DIAGNOSIS:  Coronary artery disease.   SECONDARY DIAGNOSES:  1. History of transient ischemic attack.  2. Postoperative anemia secondary to blood loss.  3. Non-insulin-dependent diabetes mellitus.   DISCHARGE DIAGNOSIS:  Coronary artery disease.   HOSPITAL COURSE:  The patient was admitted to [facility] on  03/18/2002 for known history of coronary artery disease.  He underwent  cardiac catheterization on this date.  This revealed that he had significant  coronary artery disease.  Because of this, Dr. Donata Clay was consulted.  The  patient had previously undergone coronary artery bypass grafting. Dr. Donata Clay felt redo CABG was warranted.   On 03/26/2002, Dr. Donata Clay performed a redo coronary artery bypass graft x  3 with left internal mammary artery anastomosed to the circumflex artery,  saphenous vein graft to distal left anterior descending artery, and a  saphenous vein graft to the right coronary artery.   Postoperatively, the patient had mild postop anemia secondary to blood loss.  Also during his postoperative course, the patient began having signs and  symptoms of transient ischemic attack.  Because of this, neurology  consultation was obtained.  The patient's symptoms resolved without any  residual deficits or difficulty.  Because of this, the patient was restarted  on Plavix which was taken at home.   Hemoglobin and hematocrit stabilized at 8.9 and 27.1, respectively, with  platelet count of 533.  Also during postoperative course, the patient had  postoperative fever with unknown source.  The patient was pan cultured  without any source of infection found.  He was started on empiric  antibiotics.  His fevers resolved without any further intervention.  He was  subsequently deemed stable for discharge home on 04/02/2002.   DISCHARGE MEDICATIONS:  1. Alphagan eye drops 0.15% one drop each eye daily.  2. Xalatan eye drops 0.005% one drop each eye twice daily.  3. Trusopt eye drops 2% one drop each eye twice daily.  4. Flovent 44 mcg inhaler 2 puffs twice daily.  5. Lipitor 20 mg 1 daily.  6. Perphenazine 16 mg daily.  7. Prevacid 30 mg 1 daily.  8. Neurontin 300 mg 1 tablet 3 times daily.  9. Claritin 10 mg 1 daily.  10.      Colchicine 0.6 mg 1 tablet twice daily.  11.      Tylox 1 to 2 q.4-6h. as needed for pain.  12.      Tegretol  600 mg  daily.  13.      Albuterol inhaler 2 puffs 4 times daily.  14.      Xanax 1 mg twice daily with 2 mg at bedtime.  15.      Desipramine 50 mg 1 daily.  16.      Perphenazine 4 mg 1 daily.  17.      Lasix 40 mg 1 daily.  18.      Potassium chloride 20 mEq 1 daily.  19.      Lopressor 15 mg 1/2 tablet twice daily.  20.      Niferex 150 one daily.  21.      Colace 100 mg 1 tablet twice daily.  22.      Tylenol 325 mg 2 tablets twice daily.  23.      Plavix 75 mg 1 daily.  24.      Aspirin 81 mg 1 daily.   ACTIVITY:  The patient was old no driving, no strenuous activity.  The  patient was told to walk daily and continue breathing exercises.   DIET:  1800 calorie ADA diet.   WOUND CARE:  The patient was told he could shower and clean his incisions  with soap and water.  He will have visiting nurse remove staples on 9/72003.   DISPOSITION:  Home.   FOLLOW UP:  The patient was told to call Dr. Donnie Aho for followup care.  The  patient was told to see Dr. Donata Clay on Friday, 04/23/2002 at 10:30 a.m.     Levin Erp. Steward, P.A.                      Kerin Perna, M.D.    BGS/MEDQ  D:   05/12/2002  T:  05/15/2002  Job:  [id number]

## 2010-12-21 NOTE — Assessment & Plan Note (Signed)
[facility]   NAME:  Nicholas [name], Nicholas [name]              ACCOUNT NO.:  [id number]   MEDICAL RECORD NO.:  [id number]      DATE OF BIRTH:  Jan 05, 1941   PHYSICIAN:  Jake Shark A. Tanda Rockers, M.D.      VISIT DATE:                                   OFFICE VISIT   VITAL SIGNS:  Blood pressure is 125/65, respirations are 20, pulse rate  is 69, temperature 98.3.  Capillary blood glucose is 168 mg percent.   PURPOSE OF TODAY'S VISIT:  [name] is a 70 year old diabetic with a  stage 4 decubitus ulcer.  Two weeks ago, we placed an Apligraf into this  recalcitrant ulcer using Apligraf off-label.  He returns for followup 14  days following the initial placement.  He denies excessive pain or  fever.  The dressing has remained intact.   WOUND EXAM:  Inspection of the wound shows that there are areas of  incrusted Apligraf, but the wound itself appears to have a more healthy-  appearing granulation.  There is a deep red and there is evidence of  advancing epithelium from the periphery.  This wound was photographed  and catalogued.  There is no evidence of associated infection.   DIAGNOSIS:  Definite improvement post Apligraf application.   MANAGEMENT GOALS AND PLANS:  We will resume the use of mild soap washes  and application of Regranex q.day.  We will reevaluate the patient in  two weeks.      Harold A. Tanda Rockers, M.D.  Electronically Signed     HAN/MEDQ  D:  11/26/2006  T:  11/26/2006  Job:  [id number]

## 2010-12-21 NOTE — Assessment & Plan Note (Signed)
[facility]   NAME:  Nicholas [name], Nicholas [name]              ACCOUNT NO.:  [id number]   MEDICAL RECORD NO.:  [id number]      DATE OF BIRTH:  [birth year]   PHYSICIAN:  Jake Shark A. Tanda Rockers, M.D.      VISIT DATE:                                   OFFICE VISIT   VITAL SIGNS:  Blood pressure 110/80, pulse rate 76, respirations 20, and  he is afebrile.  Capillary blood glucose 145 mg%.   PURPOSE OF TODAY'S VISIT:  Nicholas [name] is a 70 year old man who we have  followed for a stage 4 sacral decubitus.  In Nicholas interim, we started him  on Nicholas Regranex protocol, which he continues.  He denies fever and  drainage but has moderate pain, especially when laying down.   WOUND EXAM:  Inspection of Nicholas wound shows that it is clean with 100%  granulated base.  There has been some marginal contraction of Nicholas  volume.  On palpation there is marked tenderness laterally with a semi  firm mobile component to Nicholas wound consistent with a cartilage  inflammation.  I suspect that this represents some coccidial  inflammatory changes.  His interim culture is showing multiple organisms  with no predominant species.   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   MANAGEMENT PLAN & GOAL:  We will start Nicholas [name] on a 14 day course of  Levaquin and reevaluate him at Nicholas end of that time.   We have explained today's physical exam to Nicholas [name] and his wife in  terms that they seem to understand.  Although there is no active sign as  communicating to Nicholas coccus, Nicholas degree of tenderness is excessive and  is highly suspect for a possible inflammatory fossae.  They seem to  understand and indicate that they would be compliant.           ______________________________  Theresia Majors. Tanda Rockers, M.D.     [name]  D:  08/06/2006  T:  08/06/2006  Job:  [id number]

## 2010-12-21 NOTE — Assessment & Plan Note (Signed)
[facility]   NAME:  Nicholas [name], Nicholas [name]              ACCOUNT NO.:  [id number]   MEDICAL RECORD NO.:  [id number]      DATE OF BIRTH:  03/01/1941   PHYSICIAN:  Jake Shark A. Tanda Rockers, M.D.      VISIT DATE:                                   OFFICE VISIT   VITAL SIGNS:  Blood pressure is 132/45, respirations 18, pulse rate 66,  temperature 97.8.   PURPOSE OF TODAY'S VISIT:  [name] is a 70 year old man who we have  placed an Apligraf on to a sacral decubitus as an off-label indication  for a recalcitrant stage 4 decubitus wound.  He returns for followup.  In the interim, he denies excessive drainage, malodor, pain or fever.  He is accompanied by his wife.   WOUND EXAM:  Inspection of the sacral wound shows that the dressing  remains intact.  Gauze has been removed leaving in place the Mepitel and  fresh gauze was placed.  The wound was prepped with benzoin and Steri-  Strips were reapplied to hold the gauze over the Apligraf.   DIAGNOSIS:  Satisfactory appearance of the Apligraf one week postop.   MANAGEMENT GOALS AND PLANS:  We will reevaluate the patient in one week  to assess the response to therapy.  We have counseled the patient and  his wife to leave the dressing intact and we will evaluate it one week  hence.      Harold A. Tanda Rockers, M.D.  Electronically Signed     HAN/MEDQ  D:  11/19/2006  T:  11/19/2006  Job:  [id number]

## 2010-12-21 NOTE — Assessment & Plan Note (Signed)
[facility]   NAME:  [name], Nicholas              ACCOUNT NO.:  [id number]   MEDICAL RECORD NO.:  [id number]      DATE OF BIRTH:  1941-04-27   PHYSICIAN:  Jake Shark A. Tanda Rockers, M.D. VISIT DATE:  06/23/2006                                     OFFICE VISIT   VITAL SIGNS:  Blood pressure 110/70, respirations 16, pulse rate 78, and he  is afebrile.  Capillary blood glucose is 106 milligrams percent.   PURPOSE OF TODAY'S VISIT:  [name] returns for followup of a stage 4  sacral decubitus wound.  We have tried multiple modalities including  Regranex, topical Cleocin antibiotic coverage, and serial debridements.  In  spite of these efforts, the wound continues to heal extremely slow.  He  denies interim fever.  His pain is much improved.  There has been no  drainage.  The wound is being dressed daily by his wife.   WOUND EXAM:  Inspection of the sacral wound shows that the wound is clean.  There is no excessive drainage, malodor, or necrosis.  Debridement is not  indicated.  The base is 100% covered with granulation and there is evidence  of advancement of epithelium from the circumference.   DIAGNOSIS:  Slow, but definite healing wound.   MANAGEMENT PLAN & GOAL:  We will continue local care and b.i.d. Regranex per  protocol.  We will reevaluate the patient in 1 month p.r.n.           ______________________________  Theresia Majors. Tanda Rockers, M.D.     [name]  D:  06/23/2006  T:  06/24/2006  Job:  [id number]

## 2010-12-21 NOTE — Consult Note (Signed)
[facility]  Patient:    Nicholas [name], Nicholas [name] Visit Number: [id number] MRN: [id number]          Service Type: PMG Location: [facility] Attending Physician:  Sondra Come Dictated by:   Dr. Resa Miner. Date: 05/13/01 Admit Date:  04/09/2001 Discharge Date: 07/08/2001                            Consultation Report  The patient reports to clinic today for a third in a series of lumbar epidural steroid injections.  The patient underwent a second lumbar epidural steroid injection one week ago.  He states that he continues to walk a little further, but his standing tolerance has not changed significantly.  His current pain level is a 6/10.  His function and quality of life remains declined, and his sleep is poor.  He denies any new neurologic symptoms.  He continues to be off of his Plavix until after his epidural steroid injections are completed.  I review health and history form and 14 point review of systems.  There are no changes since last visit.  PHYSICAL EXAMINATION:  Healthy male in no acute distress.  No new neurologic findings, including motor, sensory, and reflexes.  Blood pressure is 148/64, pulse 79, respiratory rate 16, oxygen saturation 96%.  IMPRESSION: 1. Degenerative disc disease of the lumbar spine. 2. Spinal stenosis with neurogenic claudication. 3. Status post lumbar surgery x 6.  PLAN: 1. Third lumbar epidural steroid injection.  Procedure was described with the    patient, and informed consent was obtained.  The patient was brought back    to the fluoroscopy suite and placed on the table in prone position.  Skin    was prepped in usual sterile fashion.  Skin and subcutaneous tissue were    anesthetized with 3 cc of 1% lidocaine preservative free.  Under direct    fluoroscopic guidance a 20 gauge 6 inch Tuohy needle was advanced into the    right paramedian L5-S1 epidural space with loss of resistance technique.    No CSF, heme, or  paresthesias were noted.  This was injected with 1 cc of    preservative free Depo-Medrol 40 mg per cc, plus 1 cc of normal saline    without complication.  The patient tolerated the procedure well.  Discharge    instructions were given. 2. Continue current medications. 3. The patient is to return to clinic in two weeks for re-evaluation.  The patient was educated on the above findings and recommendations and understands.  There were no barriers to communication. Dictated by:   Dr. Andrey Campanile Attending Physician:  Sondra Come DD:  05/13/01 TD:  05/13/01 Job: [phone]

## 2010-12-21 NOTE — Assessment & Plan Note (Signed)
[facility]   NAME:  Nicholas [name], Nicholas [name]              ACCOUNT NO.:  [id number]   MEDICAL RECORD NO.:  [id number]      DATE OF BIRTH:  09/19/1940   PHYSICIAN:  Jake Shark A. Tanda Rockers, M.D.      VISIT DATE:                                   OFFICE VISIT   VITAL SIGNS:  Blood pressure is 123/69.  Respirations 16.  Pulse rate  62.  Temperature 98.1.   PURPOSE OF TODAY'S VISIT:  [name] is a 70 year old man who we have  been following for stage IV sacral decubitus ulcer.  We have performed  serial debridements and initiate a Regranex protocol.  The wound  continuous to be open.  There is scant to no drainage.  The granulation  has been healthy in appearance.  Nevertheless, the wound has not healed.  He continuous while on the Regranex protocol which has being placed by  his wife.  In the interim, there has been no fever and no malodor.   WOUND EXAM:  Inspection of the wound shows a healthy granulating wound  with no advancement of epithelium.  There is no evidence of infection.  There is no extension into bone when the wound is probed with a Q-tip.   MANAGEMENT PLAN & GOAL:  Static wound.  We will continue the Regranex  protocol and I will be requesting a off label use of Apligraf and ask  the Apligraf vendor to provide [place] with a sample.  This wound appears to  be frozen in progress and hopefully the neonatal tissue culture will  stimulate growth.  We will make this request and if it is honored we  will contact the patient for application.  Otherwise we will reevaluate  him in 2 weeks.      Harold A. Tanda Rockers, M.D.  Electronically Signed     HAN/MEDQ  D:  10/30/2006  T:  10/30/2006  Job:  [id number]

## 2010-12-21 NOTE — Assessment & Plan Note (Signed)
[facility]   NAME:  Nicholas [name], Nicholas [name]              ACCOUNT NO.:  [id number]   MEDICAL RECORD NO.:  [id number]      DATE OF BIRTH:  02/12/1941   PHYSICIAN:  Jake Shark A. Tanda [name], M.D. VISIT DATE:  12/10/2006                                   OFFICE VISIT   SUBJECTIVE:  Nicholas [name] is a 70 year old man who we have treated for  stage 4 sacral decubitus ulcer over the past several months.  He has a  focus of nonhealing which has been refractory to serial debridements and  Regranex.  Most recently, he underwent placement of an Apligraf which  was used off-label.  He returns for follow-up now at three weeks.  There  has been no interim excessive drainage, malodor, pain or fever.   OBJECTIVE:  The wound actually is contracted and has evidence of healthy-  appearing, well-vascularized granulation.  There is no exudate.  There  is no evidence of infection.  There is little or no tenderness to  palpation around the wound.  There is no evidence of sinus or abscess  formation.   ASSESSMENT:  Overall improvement.   PLAN:  We will continue the patient with the remaining portion of  Regranex.  If he exhausts his supply of Regranex prior to his next  visit, he is to continue with antiseptic soap washes daily.  We will  reevaluate him in one month.  If the wife or the patient notices  drainage, pain or fever, they will call for an interim appointment.  We  have given them an opportunity to ask questions and they seem to  understand and expressed gratitude for having been seen in the clinic  and indicate that they will be compliant as per above.      Nicholas [name], M.D.  Electronically Signed     HAN/MEDQ  D:  12/10/2006  T:  12/10/2006  Job:  [id number]

## 2010-12-21 NOTE — Op Note (Signed)
   NAME:  TABIUS, ROOD NO.:  [id number]   MEDICAL RECORD NO.:  [id number]                   PATIENT TYPE:  AMB   LOCATION:  ENDO                                 FACILITY:  [facility]   PHYSICIAN:  Petra Kuba, M.D.                 DATE OF BIRTH:  1940/10/29   DATE OF PROCEDURE:  02/16/2003  DATE OF DISCHARGE:                                 OPERATIVE REPORT   PROCEDURE:  Colonoscopy with biopsy.   INDICATIONS FOR PROCEDURE:  The patient has a history of colon polyps and is  due for repeat screening.  Consent was signed after risks, benefits,  methods, and options were thoroughly discussed multiple times in the past.   MEDICATIONS:  Demerol 80 and Versed 8.   DESCRIPTION OF PROCEDURE:  Rectal inspection is pertinent for external  hemorrhoids.  Digital examination was negative.  The video colonoscope was  inserted and easily advanced around the colon to the cecum. This did not  require any abdominal pressure or position changes.  On insertion, some left-  sided diverticuli were seen.  No other abnormalities.  The cecum was  identified by the appendiceal orifice and the ileocecal valve. The scope was  slowly withdrawn.  The prep was adequate. There was some liquid stool that  required washing and suctioning.  On slow withdrawal through the colon,  other than a tiny mid ascending polyp which was cold biopsied x2 and the  left-sided diverticuli as mentioned above, no other abnormalities were seen  as we slowly withdrew back to the rectum.  Anorectal pullthrough and  retroflexion confirmed the above findings.  The scope was straightened and  readvanced a short ways up the left side of the colon. Air was suctioned and  the scope removed. The patient tolerated the procedure well. There was no  obvious immediate complications.   ENDOSCOPIC ASSESSMENT:  1. Internal and external hemorrhoids.  2. Left-sided diverticuli.  3. Tiny ascending middle tiny polyp  cold biopsied.  4. Otherwise within normal limits to the cecum.   PLAN:  Happy to see back p.r.n.  Await pathology.  Consider repeat screening  in four or five years if doing well medically.  Otherwise return care to Dr.  Kevan Ny for customary health care maintenance including yearly rectals and  guaiacs.                                               Petra Kuba, M.D.    MEM/MEDQ  D:  02/16/2003  T:  02/16/2003  Job:  [id number]   cc:   Candyce Churn, M.D.  [address] [place]  [place] [address]  Fax: [phone]

## 2010-12-21 NOTE — H&P (Signed)
NAME:  Nicholas [name], Nicholas [name] NO.:  [id number]   MEDICAL RECORD NO.:  [id number]                   PATIENT TYPE:  EC   LOCATION:                                       FACILITY:  [facility]   PHYSICIAN:  Sondra Come, D.O.                 DATE OF BIRTH:  1941-02-05   DATE OF ADMISSION:  09/22/2001  DATE OF DISCHARGE:  12/21/2001                                HISTORY & PHYSICAL   REASON FOR ADMISSION:  For catheterization.   HISTORY OF PRESENT ILLNESS:  This 70 year old, very complex, American [place]  male is admitted for elective cardiac catheterization.  His cardiac history  dates back to 1992 at which time he developed significant angina.  He was  found to have an occluded right coronary artery and circumflex coronary  artery.  He failed medical therapy and was symptomatic despite this and also  had significant depression.  He had bypass grafting by Dr. Particia Lather in  November 1992 with a vein graft to the right coronary, a vein graft to the  diagonal, and a vein graft to the OM and distal circumflex.  Since that time  he had recurrent psychiatric problems and atypical chest pain and  uncontrolled blood pressure.  Catheterization done in December 1995 because  of recurrent symptoms showed an occluded vein graft to the right coronary,  patent vein graft to the diagonal, and patent vein graft to the OM1 and OM2.  Left ventricular function was normal.  The left anterior descending and left  main had 30% stenosis at that time.  He had an adenosine Cardiolite scan  showing an ejection fraction of 64% in 1999 and had back surgery since then.  He was hospitalized with pneumonia in January 2001 which was the last time I  had see him.  He has been exceedingly difficult to evaluate since then with  a prior diagnosis of significant depression and anxiety.  He previously was  under Dr. Runell Gess care and is now seen by Dr. Katrinka Blazing.  He has had severe pain  involving his  lower extremities and neuropathy, and is under the care of the  pain clinic.  He is limited mainly because of lower extremity pain and leg  pain and numbness.  He also has some modest dyspnea.  He has occasionally  taken nitroglycerin but over the past several weeks, following the deaths of  two close friends, he developed increasing angina to the point where he was  taking as many as five nitroglycerin per day.  He described the pain as a  sharp chest pain, left-sided, which would be severe and would be relieved  with nitroglycerin, but he would take up to five per day.  He was seen  earlier this week at [facility], had a chest x-ray and  electrocardiogram.  He reportedly had no pneumonia but had some  desaturation.  He was sent to see [place] because of increased frequency of chest  pain, the length of time since his bypass grafting, and difficult to control  symptoms it was recommended that we do catheterization today for the patency  of his grafts.   PAST MEDICAL HISTORY:  His past history is complex and is remarkable for  severe depression.  He has a prior history of hypertension, dyslipidemia,  diabetes, peptic ulcer disease, reflux esophagitis, severe asthma and severe  lumbar osteoarthritis.   PAST SURGICAL HISTORY:  Appendectomy, inguinal herniorrhaphy, and lumbar  laminectomy three times, knee and ankle surgery following a work accident in  1978, and right mastoid surgery, coronary bypass grafting in 1992, and  laparoscopic cholecystectomy.   ALLERGIES:  No known drug allergies.   CURRENT MEDICATIONS:  1. Albuterol inhaler two puffs q.6h.  2. Azmacort inhaler three puffs b.i.d.  3. Plavix 75 mg daily.  4. Glucotrol XL 5 mg b.i.d.  5. Claritin 10 mg daily.  6. Furosemide 120 mg b.i.d.  7. Atenolol 25 mg b.i.d.  8. Isosorbide mononitrate 30 mg daily.  9. Lipitor 20 mg daily.  10.      Cardizem CD 240 mg daily.  11.      Carbamazepine 600 mg q.h.s.  12.       Desipramine 50 mg q.h.s.  13.      Perphenazine 16 mg q.h.s.  14.      Perphenazine 4 mg daily at 4 p.m.  15.      Lotensin 20 mg daily.  16.      Prevacid 30 mg daily.  17.      Trusopt and Alphagan eye drops.  18.      Xalatan eye drops.  19.      Lantus 60 units daily.  20.      Alprazolam 1 mg b.i.d. and 2 mg at bedtime.  21.      Hydrocodone AT 5/500 one five times a day as needed for pain.   FAMILY HISTORY:  Reviewed from old records and is unchanged.  It is strongly  positive for heart disease.   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  He has not smoked since 1993.  He  does not use alcohol to excess.  His wife is a Emergency planning/management officer and works  in [employer].   REVIEW OF SYSTEMS:  Significantly positive.  He has had severe obesity for  years.  He has significant depression.  He has significant peripheral  neuropathy and has been on Neurontin for this.  He has had less frequent  migraines than he previously had.  He has a history of ulcers but has not  had any recent abdominal pain and has only mild constipation.  He has  urinary frequency, no significant hesitancy.  He has significant pain  involving his lower extremities and also significant peripheral neuropathy,  which is his major limiting factor.  Other than as noted above the remainder  of the review of systems is unremarkable.   PHYSICAL EXAMINATION:  GENERAL:  He is an obese [place] male who is nervous  but in no acute distress.   VITAL SIGNS:  Blood pressure is 130/87 sitting, 130/82 standing.  Pulse is  70.   SKIN:  Warm and dry.  No obvious mass or lesions.   HEENT:  No JVD, thyromegaly, or carotid bruit.  Fundi show no diabetic  retinopathy.  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  Pharynx is negative.  Neck is supple without masses.   LUNGS:  Clear bilaterally.  CARDIOVASCULAR:  Cardiac examination  showed normal S1 and S2, no definite S3  or murmur.   ABDOMEN:  Obese, soft, nontender and large.  Femoral pulses are 1+ on the  right and 1.5+ on the left.  Peripheral pulses are only 1+.  No bruit noted.   NEUROLOGIC:  Decreased sensation in the lower extremities.   LABORATORY DATA:  A 12-lead electrocardiogram shows an incomplete right  bundle branch block.  PT and PTT were normal.  CBC shows a white blood cell  count of 7000, hemoglobin 14.7, hematocrit 43.1.   IMPRESSION:  1. Chest discomfort, increasing in frequency and severity compatible with     unstable angina.  2. Coronary artery disease with eight previous bypass grafting by Dr.     Particia Lather in November 1992.  The patent grafts in 1995 to OM1 and     OM2 and diagonal with occlusion of graft to the right coronary artery.  3. Type 2 diabetes.  4. Hypertensive heart disease.  5. Peripheral neuropathy.  6. Depression and anxiety.  7. Hyperlipidemia, under treatment.  8. Severe osteoarthritis.  9. Recurrent sinusitis.  10.      History of peptic ulcer disease and hiatal hernia.  11.      Glaucoma.  12.      History of sleep apnea, previously not able to tolerate CPAP at     home.   RECOMMENDATIONS:  This is a very complex patient with increasing chest pain.  He is brought in at this time for catheterization and to assess the status  of his bypass grafts.  The procedure was discussed with the patient fully  including risks, and he has agreed and is willing to proceed.     Darden Palmer., M.D.               Sondra Come, D.O.    WST/MEDQ  D:  03/12/2002  T:  03/16/2002  Job:  [id number]   cc:   [facility]

## 2010-12-21 NOTE — Assessment & Plan Note (Signed)
[facility]   NAME:  Nicholas [name], Nicholas [name]              ACCOUNT NO.:  [id number]   MEDICAL RECORD NO.:  [id number]           DATE OF BIRTH:   PHYSICIAN:  Theresia Majors. Tanda Rockers, M.D. VISIT DATE:  05/26/2006                                     OFFICE VISIT   VITAL SIGNS:  Blood pressure is 118/60, respirations 20, pulse rate 64, and  he is afebrile.  Capillary blood glucose is 102 mg percent.   PURPOSE OF TODAY'S VISIT:  [name] returns for followup of a sacral  decubitus ulcer.  In the interim, we have treated him with Regranex protocol  and a topical Cleocin T.  His wife reports that the wound has improved,  there is less pain, there is no redness.   WOUND EXAM:  Inspection of the wound shows that there has indeed been  contraction, there is scant drainage, and there is evidence of granulation.   DIAGNOSIS:  Improved wound.   MANAGEMENT PLAN & GOAL:  We will reevaluate the patient in 1 month.  In the  interim, he is to continue the Regranex protocol and he will use the Cleocin  T until it is exhausted.           ______________________________  Theresia Majors Tanda Rockers, M.D.     [name]  D:  05/26/2006  T:  05/27/2006  Job:  [id number]

## 2010-12-21 NOTE — Op Note (Signed)
NAME:  Nicholas [name], Nicholas [name] NO.:  [id number]   MEDICAL RECORD NO.:  [id number]          PATIENT TYPE:  OBV   LOCATION:  1823                         FACILITY:  [facility]   PHYSICIAN:  Lubertha Basque. Dalldorf, M.D.DATE OF BIRTH:  1941-05-11   DATE OF PROCEDURE:  05/10/2004  DATE OF DISCHARGE:                                 OPERATIVE REPORT   PREOPERATIVE DIAGNOSIS:  Right knee infection.   POSTOPERATIVE DIAGNOSIS:  Right knee infection.   PROCEDURE:  Right knee irrigation and debridement.   ANESTHESIA:  General.   ATTENDING SURGEON:  Lubertha Basque. Jerl Santos, M.D.   ASSISTANT:  Lindwood Qua, P.A.   INDICATION FOR PROCEDURE:  The patient is a 70 year old man with a  significant number of medical comorbidities who has had difficulty with  right knee arthritis for many years.  We had injected him many times and  tried various oral anti-inflammatories.  With continued pain at rest and  with activity he was offered an arthroscopy which we performed last week.  He did fairly well until yesterday, he presented with horrible pain.  We  aspirated his knee and Gram stain and cell count and cultures are consistent  with an infection in the joint.  He presented back to the emergency room  this morning at our request and we have scheduled him for an emergent  irrigation and debridement to address the infection inside his knee joint.  Informed operative consent was obtained after discussion of possible  complications and reaction to anesthesia, infection, and knee stiffness.   DESCRIPTION OF PROCEDURE:  The patient was taken to the operating suite  where general anesthetic was applied without difficulty.  He was positioned  in supine and prepped and draped in a normal sterile fashion.  After  administration of preop IV antibiotic, an arthroscopy of the right knee was  performed through his two old inferior portals.  We evacuated about 50 or 60  mL of dark fluid consistent with his  infection.  The intraoperative findings  were basically the same as they were a week ago with significant  degenerative change in all three compartments.  I performed a thorough  lavage of the knee with 6 liters of saline.  We then placed two limbs of a  large Hemovac into the knee and hooked these up to suction.  These were  sutured in place.  A sterile dressing was applied along with a loose ACE  wrap.  Estimated blood loss __________ fluids can be obtained from  anesthesia records.  No tourniquet was placed.   DISPOSITION:  The patient was extubated in the operating room and taken to  the recovery room in stable condition.  Plans were for him to be admitted to  the orthopedic surgery service with close medical consultation to help  manage his diabetes and associated medical comorbidities.  We will start him  on Kefzol and will follow his culture results and adjust antibiotic coverage  as necessary.       PGD/MEDQ  D:  05/10/2004  T:  05/10/2004  Job:  [id number]

## 2010-12-21 NOTE — Discharge Summary (Signed)
NAME:  Nicholas [name], Nicholas [name] NO.:  [id number]   MEDICAL RECORD NO.:  [id number]          PATIENT TYPE:  INP   LOCATION:  5152                         FACILITY:  [facility]   PHYSICIAN:  Candyce Churn, M.D.DATE OF BIRTH:  [birth year]   DATE OF ADMISSION:  04/23/2006  DATE OF DISCHARGE:  04/27/2006                               DISCHARGE SUMMARY   DISCHARGE DIAGNOSES:  1. Klebsiella pneumonia urosepsis, resolved.  2. Hypokalemia, resolved.  3. Multiple medical issues as per History of Present of Illness.   HOSPITAL COURSE:  Mr. Ackerley was admitted on April 23, 2006 with  hypotension, fever and tachycardia.  He was found to have pyuria and  bacteria and ultimately had Klebsiella pneumonia sepsis.   He was treated with IV Cipro and Rocephin initially and then discharged  home after stabilization and fluid resuscitation with stable vital  signs, eating well and ambulating with a walker.   He was discharged home on Keflex which the Klebsiella pneumonia was  sensitive to and he received IV Rocephin from April 23, 2006 through  April 26, 2006.   Patient was discharged home in improved condition with stable vital  signs and had the following discharge laboratories:   White count 13,200.  Patient's initial white count at admission on  April 23, 2006 was 15,300, peaked at 20,400 on April 24, 2006  and it was down to 13,200 on the day prior to discharge.   Sodium on April 26, 2006 was 139, potassium initially 2.8 on  admission and was normal at discharge at 3.5, chloride was 98, bicarb  25, glucose 156, BUN 22, creatinine 1.4, calcium 9.2.   LFTs on April 24, 2006 were within normal limits.   Hemoglobin A1c on April 23, 2006 was 7.5%.   CK, CK-MB and relative index 185, 2.2 and 1.2, respectively.  Troponin I  was 0.08 on April 23, 2006.   TSH was 0.697 - within normal limits and valproic acid level on  April 23, 2006 was 17.6  - low, but not being used for seizure  disorder.   Urinalysis on April 23, 2006 revealed large leukocyte esterase, a  few bacteria, white cells too numerous to count.  Blood culture on  April 23, 2006 revealed no growth at five days.  A urine culture  from April 23, 2006 did reveal Klebsiella pneumonia, greater than  100,000 colonies, sensitive to Rocephin and cephazolin.   Again, the patient was discharged home in improved condition, able to  ambulate, eating well and with stable vital signs which is to be  followed up in the office in one to two weeks.      Candyce Churn, M.D.  Electronically Signed     RNG/MEDQ  D:  07/03/2006  T:  07/03/2006  Job:  [id number]

## 2010-12-21 NOTE — Assessment & Plan Note (Signed)
[facility]   NAME:  [name], [name]              ACCOUNT NO.:  [id number]   MEDICAL RECORD NO.:  [id number]      DATE OF BIRTH:  [birth year]   PHYSICIAN:  Jake Shark A. Tanda [name], M.D.      VISIT DATE:                                     OFFICE VISIT   VITAL SIGNS:  Blood pressure is 128/75, respirations 16, pulse rate of 79,  and he is afebrile.   PURPOSE OF TODAY'S VISIT:  [name] returns for followup of a slowly  healing sacral decubitus ulcer, which was initially associated with a  prolonged forced supine position, attendant with multiple systems organ  failure.  The patient had been progressing well on a Regranex protocol until  he was admitted to the hospital for a urinary tract infection a week ago.  During that admission, his Regranex was discontinued.  He returns  complaining of increasing pain, and his wife reports that the wound is  larger.   WOUND EXAM:  Inspection of the previous sacral ulcer shows that in fact the  area has been increased.  The wound has been photographed and entered into  the Wound Expert.  There continues to be chronic scarring at the periphery  with some granulation in the center, and a moderate amount of hyperemia.  There is no excessive malodor or exudate.   WOUND SINCE LAST VISIT:  __________   CHANGE IN INTERVAL MEDICAL HISTORY:  __________   DIAGNOSIS:  Retrogression of the sacral decubitus.   TREATMENT:  __________   ANESTHETIC USED:  __________   TISSUE DEBRIDED:  __________   LEVEL:  __________   CHANGE IN MEDS:  __________   COMPRESSION BANDAGE:  __________   OTHER:  __________   MANAGEMENT PLAN & GOAL:  We will continue offloading with bed modification  and pillow placement.  We will resume the Regranex protocol.  We will  substitute a Cleocin T gel to maintain moisture as opposed to the saline  moisture over the Regranex.  We will reevaluate the patient in 2 weeks.     ______________________________  Nicholas [name], M.D.     Nicholas [name]  D:  05/08/2006  T:  05/10/2006  Job:  [id number]

## 2010-12-21 NOTE — Assessment & Plan Note (Signed)
[facility]   NAME:  NICHLOS, KUNZLER              ACCOUNT NO.:  [id number]   MEDICAL RECORD NO.:  [id number]      DATE OF BIRTH:  [birth year]   PHYSICIAN:  Jake Shark A. Tanda Rockers, M.D.      VISIT DATE:                                     OFFICE VISIT   SUBJECTIVE:  Mr. Pouliot returns for followup of a sacral decubitus ulcer  with extension down into the periosteum.  In the interim, he has reported  decrease in drainage.  He has been using the Regranex protocol.   OBJECTIVE:  VITAL SIGNS:  Stable.  He is afebrile.  SKIN:  There is healthy granulation throughout the wound, even in the depths  with no exposed bone.  There is advancement of epithelium from the edges,  but the base of the wound is 100% granulated.  There is no malodor.  There  is scant drainage.   IMPRESSION:  Overall improvement.   PLAN:  We will continue Regranex and follow the patient up in three weeks.           ______________________________  Theresia Majors Tanda Rockers, M.D.     [name]  D:  01/17/2006  T:  01/17/2006  Job:  [id number]

## 2010-12-21 NOTE — Consult Note (Signed)
[place]  Patient:    Nicholas [name]                      MRN: [id number] Proc. Date: 08/28/99 Adm. Date:  [id number] Attending:  Pearla Dubonnet CC:         Pearla Dubonnet, M.D.             Norva Pavlov, M.D.                          Consultation Report  HISTORY:  The patient is a very complex 70 year old American [place] male who is  exceedingly difficult to evaluate.  His cardiac history dates back to 1992, at which time he had angina and was found to have an occluded right coronary and right circumflex coronary artery.  He failed medical therapy and was symptomatic despite maximum medical therapy.  His evaluation was complicated by significant depression. He had bypass grafting by Dr. Particia Lather in November 1992 with a vein graft to the right coronary artery, a vein graft to the diagonal, and a vein graft to the OM and distal circumflex.  He has had recurrent psychiatric problems, atypical chest pain and uncontrolled blood pressure.  He had a catheterization done in December 1995 because of recurrent symptoms, with findings at that time of an occluded saphenous vein graft to the right coronary artery, patent vein graft to the diagonal, and patent vein graft to the obtuse marginal 1 and obtuse marginal 2.  Ventricular function was normal at that time.  The left anterior descending and  left main had around 30% stenoses at that time.  He was last evaluated by me in  1999, at which point in time his blood pressure was uncontrolled.  He had an adenosine Cardiolite scan showing an ejection fraction of 64% and had no evidence of ischemia.  He had back surgery done at [facility] then.  Since that time, he has been markedly limited with activities.  He finds it difficult to walk due to severe diabetic neuropathy and also has significant reflux esophagitis and severe back pain.  He is quite limited in what he can do and  has significant dyspnea. He did not have any significant history of recurrent angina that I can tell.  He was admitted to the hospital yesterday with severe dyspnea that occurred on the morning of January 22.  He complained of a two- to three-day history of pleuritic right  sided chest discomfort, but no fever or chills.  He did have some upper respiratory infection.  He was admitted, begun on heparin and IV nitroglycerin and was given intravenous Lasix, and was felt to have some improvement to his symptoms.  A spiral CT scan showed infiltrates and a right pleural effusion.  He had no definite evidence of a pulmonary embolus.  He became febrile to 101 last evening and was  febrile this morning.  He continued to have pleuritic right sided chest discomfort. He found it difficult to sleep because of shortness of breath and chest discomfort. Because of the concerns about the chest pain and possible heart failure, I was asked to evaluate him.  PAST MEDICAL HISTORY:  His past history is complex.  He has a prior diagnosis of depression and has had psychiatric evaluation and care under Dr. Dub Mikes.  He has prior hypertension, dyslipidemia, diabetes, ulcer disease, reflux esophagitis and severe lumbar osteoarthritis.  PAST SURGICAL HISTORY:  Includes appendectomy, inguinal herniorrhaphy, lumbar laminectomy three times, knee and ankle surgery following a work accident in 1978, right mastoid surgery, coronary artery bypass grafting in 1993, laparoscopic cholecystectomy.  ALLERGIES:  None.  MEDICATIONS:  Recorded in the chart.  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  He does not smoke since 1993 and does ot use alcohol to excess.  FAMILY HISTORY:  Reviewed from old records.  It is unchanged and is noncontributory at the present time.  REVIEW OF SYSTEMS:  Remarkable for significant obesity.  He has  significant peripheral neuropathy and has been on Neurontin for this.  He has had frequent migraine headaches in the past, significant depression and anxiety, and has a number of severe chronic medical problems.  He has had a history of ulcers and as difficulty moving his bowels.  He has had some urinary frequency.  PHYSICAL EXAMINATION:  GENERAL:  He is an obese [place] male who is complaining of pleuritic right sided chest pain.  VITAL SIGNS:  Blood pressure 140/80.  Pulse currently 84.  Respiration rate 18.   SKIN:  Warm and dry.  ENT:  No JVD.  No thyromegaly.  No carotid bruits.  Fundi were not able to be examined in the room.  His pharynx is negative.  Lymph nodes are unremarkable.  LUNGS:  Some decreased breath sounds involving the right base.  There are some faint bibasilar crackles noted.  CARDIOVASCULAR:  Normal S1 and S2.  There was no definite S3 or murmur.  ABDOMEN:  Quite large, obese, nontender.  There is no significant peripheral edema noted.  EXTREMITIES:  His dorsalis pedis and posterior tibial pulses are 2+.  There are no bruits noted.  NEUROLOGIC:  Decreased sensation in his lower extremities.  LABORATORIES:  A 12-lead ECG is reviewed and shows an incomplete right bundle branch block, IV conduction delay, but is otherwise unremarkable.  Chest x-ray as reviewed in radiology.  There appeared to be bibasilar infiltrates and a right pleural effusion.  Negative CPK-MB, albumin 2.9, troponin less than 0.3.  PT/PTT was normal. White count was 16,000 on admission.  Echocardiogram shows a technically limited study due to poor acoustic windows. The left ventricle is grossly normal, but endocardium is difficult to see and wall motion is difficult to evaluate.  The left atrium is mildly enlarged.  There is  evidence of decreased LV compliance with reversal of the E:A ratio.  IMPRESSION:  1. The clinical picture in my opinion is more consistent  with the onset of a     a pneumonia recently.  The pain he had was pleuritic, has been accompanied ith      fever and bibasilar infiltrates and a small effusion.  He may have had an     element of diastolic dysfunction which led to some transient pulmonary     congestion which improved with diuresis, but I still think the predominant     clinical picture is consistent with that of pneumonia.  I do not think this is     angina or ischemia.  2. Coronary artery disease with:     a. Previous coronary bypass grafting in 1993 for refractory angina.     b. Patent grafts in 1995 to the obtuse marginal 1 and 2 and diagonal with an        occlusion of the graft to the right coronary artery.  Adenosine was negative        in 1999.  3. Diabetes mellitus.  4. Hypertension.  5. Peripheral neuropathy.  6. Depression and anxiety.  7. Hyperlipidemia under treatment.  8. Hypertensive heart disease.  9. Severe osteoarthritis. 10. History of peptic ulcer disease and hiatal hernia. 11. Recurrent sinusitis. 12. History of glaucoma. 13. Sleep apnea, not able to tolerate CPAP at home. 14. History of congestive heart failure thought due to diastolic dysfunction.  RECOMMENDATIONS:  At the present time, he is receiving what appears to be adequate treatment for pneumonia.  I would continue on his other complex medical regimen at this time.  Following treatment of pneumonia, if he has residual cardiac symptoms or concern of heart failure or ischemia, we could consider a catheterization. However, I think it is unlikely this was a primary cardiac problem at this time and would favor overall treatment of his pneumonia.  I appreciate seeing this nice an with you and would be glad to follow him allow with you. DD:  08/28/99 TD:  08/28/99 Job: [id number]

## 2010-12-21 NOTE — Consult Note (Signed)
NAME:  Nicholas [name], BOUGIE NO.:  [id number]   MEDICAL RECORD NO.:  [id number]                   PATIENT TYPE:  INP   LOCATION:  3036                                 FACILITY:  [facility]   PHYSICIAN:  Melvyn Novas, M.D.               DATE OF BIRTH:  1941/05/03   DATE OF CONSULTATION:  04/02/2002  DATE OF DISCHARGE:                                   CONSULTATION   REFERRED BY:  Ward Givens, M.D. and Gwenith Daily. Tyrone Sage, M.D.   HISTORY OF PRESENT ILLNESS:  This patient is a known 70 year old right-  handed Native-American gentleman who was discharged earlier the same day  from the cardiovascular thoracic surgery service and who is nine days status  post open heart surgery for a re-CABG.  The patient has originally been  hospitalized by W. Viann Fish, M.D. who followed him outpatient and then  taken to surgery by Dr. Tyrone Sage.  He was evaluated on August 28 by my  colleague, Marolyn Hammock. Reynolds, M.D., for what was assumed to be right arm  numbness and suspected TIAs.  The patient has a longstanding history of  coronary artery disease with bypass grafting, the first time in 1992.  He  developed neurological difficulties in the afternoon of August 26, when he  brought to the nurses and Dr. Dennie Maizes noted that he had brief episodes of  10 to 15 minutes duration during which his right hand seemed numb and  somewhat heavier. He was unable to grip things well and he had a clumsy  feeling as well as a decreased __________  to his hand that he now describes  as feeling like the hand was in a glove.  He had another episode of that  in the morning during visitation and the consultation was requested.  Upon  questioning, it turns out that he had these spells on and off for five days  in the hospital, but did not bring to the primary team's attention.  He  reports that there were some things a little similar to this 3 years ago  with tingling and numbness in the  right hand.  Also he did not have  spattering symptoms of recurrence multiple times.  The symptoms then came on  and stayed.  He is now experiencing the numbness is not associated with  tingling, pin and needle paresthesias.  He has no associated symptoms of  brainstem involvement, cortical involvement, of other focalities, and no  alteration of consciousness.   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  From that time on he had a mild expressive aphasia, but  intact sensory.  He had coronary artery disease and is status post CABG x2  now, hypertension, history is traceable for over 30 years.  He was diagnosed  with diabetes mellitus three years ago and had not developed neuropathy,  retinopathy, or kidney disease.  He has a history of anxiety and depression,  dyslipidemia, GERD, glaucoma, and also diagnosed with significant degree of  sleep apnea, but feels he cannot tolerate a CPAP due to his anxiety  disorder.  He has numerous surgeries for other reasons in the past.   FAMILY HISTORY:  Remarkable for coronary artery disease in siblings and  parents, probable hyperlipidemia.   SOCIAL HISTORY:  The patient is married.  He is disabled.  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  He has not smoked  in over 10 years.  He does not drink alcohol.   ALLERGIES:  He is intolerant to ACE INHIBITORS as they produce a dry cough.   MEDICATIONS:  Prior to admission he was taking albuterol, Nasacort, Plavix,  Glucotrol, Claritin, Lasix, atenolol, Bismol, Lipitor, Cardizem, Tegretol,  Avapro, Trilafon, Lotensin, Prevacid, Trusopt eyedrops, Alphagan eyedrops,  Riopan eyedrops, as well as Lantus Insulin, Xanax for anxiety p.r.n., and  Vicodin p.r.n.  His Plavix had been held during the hospitalization to allow  normalization of coagulation times.   He is still on low dose aspirin.   REVIEW OF SYSTEMS:  Mild fever.  He had no weight changes.  Otherwise per  history of present illness, he was not suffering any increased symptoms of  depression.  He still has mild peripheral neuropathy complaints, occasional  migraine, gastritis, constipation, urinary frequency with nocturia, lower  extremity pain which sounds more myalgic.   The patient reports that in the four hours that he stayed at his house, he  developed a 30-minute duration of expressive aphasia in which he was able to  understand verbal messages given to him, but was not able to respond  appropriately.  This brought him to the emergency room.  The patient was  evaluated in the emergency room by Ward Givens, M.D.  Dr. Tyrone Sage saw the  patient and left orders for an evaluation which included a CT scan of the  brain without contrast.  This reveals a very small left parietal subdural  hematoma that does not cross the midline and shows no mass effect or  edematous effect.  Dr. Tyrone Sage requested Dr. Lynelle Doctor to consult neurosurgery  as well as neurology.   PHYSICAL EXAMINATION:  GENERAL:  The patient is an obese man in no acute  distress.  VITAL SIGNS:  Stable, temperature 98.6 degrees Fahrenheit, pulse 100, blood  pressure 160 to 140 systolic variable over the last four hours, the  diastolic pressures run in the 78G to 70s, respiratory rate now 16 at rest.  MENTAL STATUS EXAM:  He is alert.  He is oriented and the speech is fluent.  He has good comprehension.  He names objects and repeats sentences.  Cranial  nerves show his pupils are equal and round about 4 mm wide.  Extraocular  movements appear intact.  Facial sensation is intact.  Facial movement  appears symmetrical.  The patient is able to perform tongue movements.  Uvula and tongue move midline.  Hearing is bilaterally, but has  nonlateralized somewhat decreased to higher frequencies.  Motor examination shows 5/5 strength  in the upper and lower extremities.  He has not produced  a pronator drift.  He can however, with is right hand, not perform a rapid  alternating movement.  The sensation appears intact to fine touch and  vibration as well as pinprick  except for the lower extremities where he has  a known sensory neuropathy.   IMPRESSION:  This patient has a new onset symptom that have come up  episodically and developed over four to five days, but no evidence of an  acute change in mental status except for the aphasia.  The patient's finding  on CT scan a left parietal mixed density subdural hematoma appears very  small to cause these symptoms, but this cannot be ruled out.  We will need  to also evaluate for ongoing TIA for which the patient had been placed on  Plavix which we need now to discontinue.   RECOMMENDATIONS:  Because of the subdural hematoma, no anticoagulation or  antiplatelet therapy.  Follow-up CAT scan in the hospital to evaluate if the  bleed is increasing.  The patient will undergo an EEG evaluation for  possible seizure disorder by cortical irritation due to the subdural bleed.  We will also repeat an MRI, MRA of the  brain to evaluate for possibility of mass effect.  An echocardiogram and EEG  will need to be directed by the patient's cardiology team.  The patient will  be admitted to the neurology service on the third floor and we would  appreciate the help of cardiology and CVTS as well as neurosurgery for  further workup and proceedings.                                               Melvyn Novas, M.D.    CD/MEDQ  D:  04/08/2002  T:  04/09/2002  Job:  [id number]   cc:   Gwenith Daily. Tyrone Sage, M.D.  [address]  [place]  [place] [address]  Fax: [phone]   W. Ashley Royalty., M.D.  [address], [address]  [place]  [place] [address]  Fax: [phone]   Hewitt Shorts, M.D.

## 2010-12-21 NOTE — Consult Note (Signed)
[facility]  Patient:    Nicholas [name], Nicholas [name] Visit Number: [id number] MRN: [id number]          Service Type: PMG Location: [facility] Attending Physician:  Sondra Come Dictated by:   Sondra Come, D.O. Proc. Date: 04/30/01 Admit Date:  04/09/2001                            Consultation Report  FOLLOWUP EVALUATION:  The patient returns to clinic today for lumbar epidural steroid injection.  He was last seen in clinic on April 16, 2001.  He has stopped taking his Plavix in anticipation of the injection.  His PT/PTT were 13.2 and 33 and his INR was 1.0.  Currently, the patients pain is a 7/10 on a subjective scale.  His pain continues to be achy with some numbness in his lower extremities.  His function and quality of life remain declined.  His sleep remains poor.  Health and history form and 14-point review of systems are reviewed.  There are no changes since last visit.  PHYSICAL EXAMINATION:  GENERAL:  Physical examination reveals a healthy male in no acute distress.  VITAL SIGNS:  Blood pressure is 157/65, pulse 72, respirations 17 and regular, O2 saturations 96%.  Examination unchanged.  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  PLAN: 1. Lumbar epidural steroid injection.  Procedure was described to patient.    Informed consent was obtained.  Patient was brought back to the fluoroscopy    suite and placed on the table in prone position.  Skin was prepped in usual    sterile fashion.  Skin and subcutaneous tissue were anesthetized with 3 cc    of 1% lidocaine.  Under direct fluoroscopic guidance, an 18-gauge    3-1/2-inch Tuohy needle was advanced into the left paramedian L5-S1    epidural space with loss-of-resistance technique.  No CSF, heme or    paresthesias were noted.  This was injected with 1 cc of preservative-free    Depo-Medrol 40  mg/cc plus 1 cc of preservative-free normal saline without    complication.  Patient tolerated the procedure well. 2. Continue current medications. 3. Return to clinic for reevaluation and possible lumbar epidural steroid    injection in one week.  Patient was educated on the above findings and recommendations and understands.  There were no barriers to communication. Dictated by:   Sondra Come, D.O. Attending Physician:  Sondra Come DD:  04/30/01 TD:  04/30/01 Job: [id number]

## 2010-12-21 NOTE — Assessment & Plan Note (Signed)
[facility]   NAME:  Nicholas [name], Nicholas [name]              ACCOUNT NO.:  [id number]   MEDICAL RECORD NO.:  [id number]          DATE OF BIRTH:   PHYSICIAN:  Theresia Majors. Tanda Rockers, M.D. VISIT DATE:  07/21/2006                                   OFFICE VISIT   VITAL SIGNS:  The blood pressure is 158/74, respirations are 20, pulse  rate 84 and he is afebrile.  Capillary blood glucose is 103 mg percent.   PURPOSE OF TODAY'S VISIT:  Nicholas [name] is a 70 year old man who we have  been following for a stage IV sacral decubitus ulcer.  He has had some  progressive contraction of this ulcer and most recently we have started  him on a Regranex protocol due to a static appearance of the wound.  In  the interim, he denies excessive drainage or malodor.  He continues to  use antiseptic soap and the b.i.d. applications per his wife.   WOUND EXAM:  Inspection of the wound shows that there is an extended  linear component, but the depth has remained the same.  There is no need  for debridement.  A culture was taken and forwarded.   DIAGNOSIS:  Stage IV decubitus ulcer, improved.   MANAGEMENT PLAN & GOAL:  We will continue the Regranex protocol.  We  will reevaluate the patient in 2 weeks.           ______________________________  Theresia Majors Tanda Rockers, M.D.     Nicholas [name]  D:  07/21/2006  T:  07/22/2006  Job:  [id number]

## 2010-12-21 NOTE — Consult Note (Signed)
NAME:  Nicholas [name], Nicholas [name] NO.:  [id number]   MEDICAL RECORD NO.:  [id number]          PATIENT TYPE:   LOCATION:                                 FACILITY:   PHYSICIAN:  Theresia Majors. Tanda Rockers, M.D.     DATE OF BIRTH:   DATE OF CONSULTATION:  04/04/2005  DATE OF DISCHARGE:                                   CONSULTATION   REASON FOR CONSULTATION:  Nicholas [name] is a 70 year old man referred by Dr.  Kevan Ny for evaluation and management of a sacral decubitus.   IMPRESSION:  Resolving stage IV sacral decubitus.   RECOMMENDATIONS:  With the dramatic improvement over the past month, this  patient is now ready for an open management, including local care with  anesthetic soap and a dry dressing.  To achieve this, we recommend that we  do this with b.i.d. showers.  We will follow the patient with monthly  intervals with anticipation that this wound will rapidly close in a  commensurate fashion with his improving nutrition and mobility.   SUBJECTIVE:  Nicholas [name] was recently discharged from [facility]  after a three month hospitalization complicated by staph septicemia,  respiratory failure, mild cardiac function impairment.  Nicholas [name] was managed by  multiple intensivists and surgeons.  During the course of his forced supine  position, Nicholas [name] developed a decubitus.  Nicholas [name] was seen in consultation with the  wound care service and was placed on a wound vac and responded predictably  and very positively.  Nicholas [name] was discharged today and arrangements have been  made for his wound management to be continued on an outpatient status.   PAST MEDICAL HISTORY:  1.  Vascular disease.  2.  Hypercholesterolemia.  3.  Gout.   CURRENT MEDICATIONS:  1.  Plavix 75 mg a day.  2.  Proscar 5 mg.  3.  Glucotrol XL 5.  4.  Cartia XL 240.  5.  Nexium 40 mg a day.  6.  Allopurinol 300 mg a day.  7.  Imdur 30 mg a day.  8.  Lipitor 40 mg daily.  9.  Flomax 0.4 mg b.i.d.  10. Atenolol 50 mg b.i.d.  11. Lasix 40 mg daily.  12. Potassium 20 mEq daily.  13. Nicholas [name] is also on colchicine 0.5 mg b.i.d. p.r.n.  14. Nicholas [name] received intravenous antibiotics during his hospitalization and had      also required Vicodin for pain.   PAST SURGICAL HISTORY:  1.  Arthroscopy.  2.  Nicholas [name] has a history of questionable TIA's but has had no positive studies      on Doppler exam.   FAMILY HISTORY:  Markedly positive for cardiovascular disease and diabetes.   SOCIAL HISTORY:  Nicholas [name] is married.  His children live locally.  His wife is a  Producer, television/film/video.   REVIEW OF SYSTEMS:  Nicholas [name] has had some difficulties with exercise tolerance and  shortness of breath, all related to central cardiac diagnoses.  His bowel  and bladder function have been remarkable for symptoms of benign prostatic  hypertrophy.  His PSAs have been unremarkable.  Nicholas [name]  has had a 100 pound  weight loss due to his recent illness but this is now in reversal and  commensurate with his increasing appetite.  The remainder of the review of  systems is negative.   PHYSICAL EXAMINATION:  GENERAL:  Nicholas [name] is a chronically ill man in no acute  distress.  HEENT:  Clear.  NECK:  Supple.  LUNGS:  Clear.  ABDOMEN:  Soft.  EXTREMITIES:  Some early contractures at the knee.  There are some previous  arthrostomy incisions over the right knee.  Examination of the sacral area  shows a deepened recession at the sacral coccygeal area, but there is no  active drainage.  The base of this wound is completely granulized, but there  is no epithelium.  It appears to be healthy.  There is no need for  debridement at this time.  His wound was measured, photographed, and entered  into the [facility] computer program.  NEUROLOGIC:  The patient had decreased sensation in his lower extremities.   DISCUSSION:  This patient's sacral decubitus has shown remarkable  improvement.  Nicholas [name] apparently had an extensive wound that was managed on the  inpatient wound service.  Nicholas [name] does not need  wound vac at this time, as the  wound has completely granulized and it is closing.  We anticipate a  continued healing process commensurate with his increased po intake and  overall increased mobility.  We will see him in one month p.r.n. to follow  him until complete closure is achieved.  We have explained this approach to  the patient and his wife in terms that they seem to understand.  They  expressed gratitude for having been seen in the wound clinic and reflect a  desire to be compliant with the instructions given.           ______________________________  Theresia Majors. Tanda Rockers, M.D.     [name]  D:  04/05/2005  T:  04/05/2005  Job:  [id number]   cc:   Candyce Churn, M.D.  [address] [place]  [place] [address]  Fax: [phone]

## 2010-12-21 NOTE — Assessment & Plan Note (Signed)
[facility]   NAME:  Nicholas [name], Nicholas [name]              ACCOUNT NO.:  [id number]   MEDICAL RECORD NO.:  [id number]      DATE OF BIRTH:  1941-06-19   PHYSICIAN:  Jake Shark A. Tanda Rockers, M.D. VISIT DATE:  02/04/2007                                   OFFICE VISIT   SUBJECTIVE:  Nicholas [name] is a 70 year old man whom we have followed for  stage IV sacral decubitus ulcer.  During Nicholas last visit, Nicholas wound show  clinical improvement and we have recommended that he continue antiseptic  soap washes and a dry dressing, IE, cotton briefs. He returns for follow-  up.  He is accompanied by Nicholas [name].  There has been no excessive  drainage, malodor, pain or fever.   OBJECTIVE:  Blood pressure is 130/64, respirations 20, pulse rate 64,  temperature 98.3.  Capillary blood glucoses 154 mg percent.  Inspection  of Nicholas area of Nicholas wound shows that there is clean granulation; no  evidence of infection.  There is no drainage.  No malodor.   ASSESSMENT:  Clinical improvement.   PLAN:  We will continue antiseptic soap washing daily and dressing  utilizing a clean cotton brief.  We will reevaluate Nicholas [name] in 1  month.  If Nicholas [name] shows continued contraction and clinical  improvement, I have advised Nicholas [name] that she may call and cancel Nicholas  appointment, but we would like to have her assessment of Nicholas wound at  that point.  We intend to follow Nicholas wound until it is completely  resolved.  We have given Nicholas [name] and Nicholas [name] an opportunity to ask  questions.  They seem to understand Nicholas discussions and indicate that  they will be compliant.      Harold A. Tanda Rockers, M.D.  Electronically Signed     HAN/MEDQ  D:  02/04/2007  T:  02/04/2007  Job:  [id number]

## 2010-12-21 NOTE — Consult Note (Signed)
[facility]  Patient:    Nicholas [name]                      MRN: [id number] Proc. Date: 09/02/99 Adm. Date:  [id number] Attending:  Pearla Dubonnet Dictator:   Gwenith Daily Tyrone Sage, M.D. CC:         Gwenith Daily. Tyrone Sage, M.D.                          Consultation Report  REFERRING PHYSICIAN:  Dr. Kevan Ny for a right pleural effusion, ? empyema.  HISTORY OF PRESENT ILLNESS:  The patient is a 70 year old American [place] male ith a long medical history who presented with progressive shortness of breath six days prior to this examination and called 911 and was brought to the emergency room. He has been evaluated in the hospital, and noted on chest x-ray to have evidence of right lower lobe pneumonia.  Subsequently, a CT scan was done which showed a right pleural effusion.  Thoracentesis was done on this which showed exudative effusion and a question of empyema.  To date, blood cultures have been negative.  Total protein on the fluid revealed 4.7 with a fluid glucose of 4.  So far, no culture results on the fluid have been returned.  Thoracentesis removed approximately 1500 cc of fluid.  A chest x-ray with lateral decubitus film continued to show evidence of a right free flowing right effusion.  The patient has been on IV antibiotics, but has continued to have low-grade temperatures to 100 to 101.  PAST MEDICAL HISTORY:  Long and complex, and will not totally be reviewed here, but he has known severe diabetes, status post coronary artery bypass grafting in 1993 by Dr. Andrey Campanile, subsequent cardiac catheterization revealed the vein graft to the right as being occluded, subsequent Cardiolite stress test at [facility] showed no ischemia.  The patient is markedly limited in his overall physical activities. He also has a significant psychiatric history.  He has a history of hypertension, dyslipidemia, ulcer disease, reflux esophagitis, some  severe lumbar osteoarthritis.  PAST SURGICAL HISTORY: 1. Appendectomy. 2. Inguinal hernia. 3. Lumbar laminectomy x 6. 4. Knee and ankle surgery in 1978. 5. Laparoscopic cholecystectomy.  CURRENT MEDICATIONS:  At the time of his admission the patient was on 21 different medications, which will not be listed again.  REVIEW OF SYSTEMS:  Reviewed as in the patients chart.  PHYSICAL EXAMINATION:  GENERAL:  The patient is an ill-appearing obese male and very slightly dyspneic.  VITAL SIGNS:  Maximum temperature was 101 over the past 24 hours, currently is t 100.  Sinus rate at 90.  Blood pressure 130/74.  Respiratory rate 18 to 20.  LUNGS:  The patient has decreased breath sounds over the right lung.  NECK:  No jugular venous distension.  ABDOMEN:  Obese, without palpable organomegaly, but yet an aortic caliber cannot be palpated.  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  The patients CT scan and chest x-rays were reviewed.  I have recommended to him  that we initially proceed with placement of a right chest tube as the fluid appears to be free-flowing to avoid generalized seizure in this obviously compromised patient.  If there is adequate drainage with the chest tube I will continue without IV antibiotics.  If adequate drainage cannot be obtained, we will  consider video assisted thoracoscopy.  With this in mind, a 28 chest tube was placed without difficulty into the right  pleural space.  Approximately 300 to 400 cc of serosanguineous fluid was removed. This fluid was sent for chemistries and culture.  A follow up chest x-ray will e obtained. DD:  09/02/99 TD:  09/03/99 Job: [id number]

## 2010-12-21 NOTE — Op Note (Signed)
NAME:  MACEDONIO, SCALLON NO.:  [id number]   MEDICAL RECORD NO.:  [id number]                   PATIENT TYPE:  INP   LOCATION:  2304                                 FACILITY:  [facility]   PHYSICIAN:  Kerin Perna III, M.D.           DATE OF BIRTH:  Mar 24, 1941   DATE OF PROCEDURE:  03/24/2002  DATE OF DISCHARGE:                                 OPERATIVE REPORT   PREOPERATIVE DIAGNOSES:  1. Left main stenosis.  2. Class IV unstable angina.  3. Three-vessel coronary disease, status post prior coronary artery bypass     graft x4 in 1992.   POSTOPERATIVE DIAGNOSES:  1. Left main stenosis.  2. Class IV unstable angina.  3. Three-vessel coronary disease, status post prior coronary artery bypass     graft x4 in 1992.   PROCEDURE:  Redo coronary artery bypass grafting x3 (left internal mammary  artery to circumflex marginal, saphenous vein graft to distal left anterior  descending coronary artery, saphenous vein graft to right coronary artery).   SURGEON:  Kerin Perna, M.D.   ASSISTANTS:  Salvatore Decent. Cornelius Moras, M.D., and Debby Freiberg. Thomasena Edis, P.A.   ANESTHESIA:  General by Guadalupe Maple, M.D.   INDICATIONS:  The patient is a 70 year old obese type 2 diabetic who had  undergone previous coronary artery bypass grafting in 1992.  He presented  with recurrent symptoms of unstable angina and was found by catheterization  to have left main stenosis and two occluded vein grafts and a subtotal  stenosis of a vein graft to the circumflex marginal 1 and 2.  His  ventricular function was preserved, and he was felt to be a candidate for  surgical coronary revascularization.  Prior to surgery I reviewed the  results of the cardiac catheterization with the patient and his family.  I  discussed the indications and expected benefits of redo coronary artery  bypass grafting for treatment of his recurrent coronary artery disease.  I  discussed with the patient the  alternatives to surgical therapy for  treatment of his coronary artery disease.  I discussed with the patient and  wife the major aspects of the proposed operation, including the choice of  conduit for grafting, the location of the surgical incisions, the use of  general anesthesia and cardiopulmonary bypass, and the expected  postoperative hospital recovery period.  I reviewed with the patient the  risks to him of redo coronary artery bypass grafting, including the risks of  MI, CVA, bleeding, blood transfusion requirement, infection, and death.  He  understood these implications for the surgery and agreed to proceed with the  surgery as planned under what I felt was an informed consent.   OPERATIVE FINDINGS:  The patient's heart was enlarged.  The mediastinum was  filled with dense adhesions from the previous surgery.  The ascending aorta  was foreshortened.  The saphenous vein was of average quality.  The mammary  artery was small but had adequate flow.  The LAD and circumflex vessels were  deeply intramyocardial.  The patient would be at high risk for any future  surgical coronary revascularization.   DESCRIPTION OF PROCEDURE:  The patient was brought to the operating room and  placed supine on the operating table, where general anesthesia was induced  under invasive hemodynamic monitoring.  The chest, abdomen, and legs were  prepped with Betadine and draped as a sterile field.  A sternal incision was  made using the oscillating saw with care being taken to avoid injury to the  underlying vascular structures.  The saphenous vein was harvested from the  right thigh using endoscopic vein technique.  The sternal retractor was  placed, and the dense mediastinal adhesions were initially dissected to free  up the sternal edges.  The retractor was then placed to harvest the mammary  artery, which was harvested from the subclavian vessels.  It was a 1.5-2.0  mm vessel with good flow.  After  harvesting the mammary artery, heparin was  administered and the ACT was documented as being therapeutic for this  patient, who was given aprotinin protocol for the cardiopulmonary bypass.  The sternal retractor was placed, and additional dissection of the anterior  mediastinum was completed to expose the ascending aorta, the vein grafts,  and the right atrium.  The patient was then cannulated and placed on bypass.  Cardioplegia cannulas were placed for both antegrade and retrograde delivery  of cold blood cardioplegia.  The patient was then cooled, and the remainder  of the dissection was done under the crossclamp to expose the posterior  aspect of the heart.  The aortic crossclamp had been applied, and a total of  800 cc of cold blood cardioplegia was delivered between antegrade and  retrograde catheters.  There was good initial cardioplegic arrest with  septal temperatures dropping to less than 14 degrees.  Topical iced saline  slush was used to augment myocardial preservation, and a pericardial  insulator pad was used to protect the left phrenic nerve.  The heart was  then further dissected out.  The previously-placed vein grafts to the  circumflex were high in the AV groove and were barely visible.  The LAD was  deeply intramyocardial, and attempts to locate it in the midseptum were  unsuccessful, and the decision was made to place a vein graft to the distal  LAD close to the apex, where it was located.  The posterior descending was  deeply intramyocardial, and a decision was made to place the right graft to  the distal right.  The circumflex marginals were heavily calcified and poor  targets and were deeply intramyocardial.  The decision was made to place the  graft to the circumflex to the previous sequential vein after the segment of  high-grade stenosis.   The distal coronary anastomoses were then performed.  The first distal  anastomosis was to the distal LAD close to the LV  apex.  The reversed saphenous vein was sewn end-to-side with running 7-0 Prolene with good flow  to the graft.  Cardioplegia was redosed through the retrograde catheter and  through the vein graft.  The second distal anastomosis was to the distal  right coronary artery.  This was a 1.5 mm vessel and was totally occluded  proximally.  A reversed saphenous vein was sewn end-to-side with running 7-0  Prolene, and there was good flow through the graft.  Cardioplegia was  redosed.  The third distal anastomosis was placed to the circumflex marginal  system into the previously-placed vein graft just before the anastomosis to  the OM-1.  An end-to-side anastomosis with the internal mammary artery was  constructed using running 8-0 Prolene.  The mammary pedicle had been brought  through an opening created in the left lateral pericardium and was brought  down to the circumflex for the anastomosis.  There was good initial flash of  blood through the anastomosis after brief release of the atraumatic vascular  clamp on the mammary pedicle.  The mammary pedicle was secured to the  epicardium.   Attention was then directed toward placing the two proximal vein anastomoses  down the ascending aorta, which was very scarred and foreshortened.  The  proximal anastomosis to the LAD was placed into the hood of the patent vein  graft to the circumflex using a short incision and running 7-0 Prolene.  The  proximal right coronary vein graft was placed into the ascending aorta just  below the previously-placed right anastomosis for the proximal aortocoronary  bypass.  A 4.0 mm punch was created and an end-to-side anastomosis was  performed using running 6-0 Prolene.  Air was evacuated from the heart, and  a dose of warm retrograde blood cardioplegia was given.  The mammary pedicle  was released, and there was immediate rewarming of the left ventricular  myocardium temperature.  The aortic crossclamp was then  released, and the  heart resumed a spontaneous rhythm.  The patient was reperfused and  rewarmed.  The coronary anastomoses were checked for hemostasis of the  proximal and distal sites.  Temporary pacing wires were applied.  The  patient was then rewarmed and reperfused and after 30 minutes, an initial  attempt to wean from bypass was made.  The patient separated from bypass but  then had a drop in blood pressure and reduced LV function.  The patient was  then placed back on bypass, and dopamine was started at a low to medium dose  and the patient was then weaned off bypass successfully after placing a  right femoral artery catheter to monitor arterial pressure.  Protamine was  administered without adverse reaction.  Hemodynamics and blood pressure  remained stable.  The cannulas were removed.  The mediastinum was irrigated  with warm antibiotic irrigation.  The leg incision was irrigated and closed  in a standard fashion.  The superior mediastinal fat was closed over the aorta.  Two mediastinal and a left pleural chest tube were placed and  brought out through separate incisions.  The sternum was reapproximated with  interrupted steel wire.  The pectoralis fascia was closed with interrupted  #1 Vicryl.  The subcutaneous and skin were closed with a running suture, and  sterile dressings were applied.  Total bypass time was 270 minutes with  aortic crossclamp time of 150 minutes.                                                Mikey Bussing, M.D.    PV/MEDQ  D:  03/24/2002  T:  03/27/2002  Job:  [id number]   cc:   W. Ashley Royalty., M.D.

## 2010-12-21 NOTE — Consult Note (Signed)
NAME:  Nicholas [name], GATLEY NO.:  [id number]   MEDICAL RECORD NO.:  [id number]          PATIENT TYPE:  OBV   LOCATION:  5029                         FACILITY:  [facility]   PHYSICIAN:  Candyce Churn, M.D.DATE OF BIRTH:  10/09/1940   DATE OF CONSULTATION:  05/10/2004  DATE OF DISCHARGE:                                   CONSULTATION   REFERRING PHYSICIAN:  Dr. Lubertha Basque. Dalldorf.   REASON FOR ADMISSION:  Mr. Ruesch is a very pleasant 70 year old male with  multiple medical problems, who had arthroscopic right knee surgery on  May 03, 2004.  Over the last 24-48 hours, he has developed severe pain  in his knee and was lavaged intraoperatively by Dr. Lubertha Basque. Dalldorf for  septic arthritis.   Knee aspiration per May 10, 2004 revealed gout crystals, bacteria and the  white count was 255,000.   The patient is now admitted for IV therapy.   PAST MEDICAL HISTORY:  Past medical history includes:  1.  Hypertension.  2.  Type 2 diabetes mellitus.  3.  Coronary artery disease with CABG x2.  4.  Obesity.  5.  COPD.  6.  Asthma.  7.  Mild renal insufficiency with last creatinine of 1.7 in August 2005.  8.  Glaucoma.  9.  Depression.  10. History of CVA with minimal residual deficit.  11. Hiatal hernia with GERD.  12. Obstructive sleep apnea, but does not tolerate CPAP.  13. Peripheral neuropathy.   MEDICATIONS:  1.  Neurontin 300 mg t.i.d.  2.  [place] eye drops 0.1% 1 drop in each eye b.i.d.  3.  Betoptic S 0.25% 1 drop in each eye b.i.d.  4.  Cartia XT 240 mg 1 daily.  5.  Xalatan 0.005% 1 drop in each eye daily.  6.  Benazepril 40 mg daily.  7.  Zaroxolyn 5 mg 1 tablet weekly.  8.  Actos 30 mg daily.  9.  Quinine sulfate 260 mg 1/2 tablet Monday, Wednesday and Friday nights.  10. Lantus 60 units subcu daily.  11. Imdur 30 mg daily.  12. Atenolol 25 mg b.i.d.  13. Glucotrol XL 5 mg b.i.d.  14. Plavix 75 mg daily.  15. Azmacort inhaler 3 puffs  b.i.d.  16. Flovent inhaler 44 mcg 2 puffs b.i.d.  17. Lipitor 40 mg daily.  18. IV cephazolin started today.  19. Sublingual nitroglycerin p.r.n.  20. Vicodin 5/500 mg 1 tablet p.o. 5 times a day.  21. Colace 100 mg 1 p.o. b.i.d.  22. Allegra 180 mg daily.   ALLERGIES:  The patient has no known drug allergies.   PHYSICAL EXAM:  GENERAL:  On physical exam, the patient is obese, oriented,  currently without complaints.  He states that he feels he is pain-free  postoperatively, involving his right lower extremity, but is on a PCA pump.  VITAL SIGNS:  Temperature is 99.8, pulse is 99 and regular, respiratory rate  is 18 and nonlabored, blood pressure 163/59.  He had an O2 saturation on 2 L  of oxygen of 95%.  HEENT:  Atraumatic, normocephalic.  Oropharynx is  moist.  NECK:  Neck is supple without JVD.  CHEST:  Chest is clear to auscultation.  CARDIAC:  Exam reveals a regular rhythm, no murmurs or gallops.  ABDOMEN:  Abdomen is soft and nontender.  EXTREMITIES:  Extremities reveal no significant edema.  He has a large Ace  wrap over his right knee and Hemovacs placed that do have serosanguineous  fluid within.  RECTAL:  Exam not performed.  GENITALIA:  Exam not performed.  NEUROLOGICAL EXAM:  Oriented x3, moves all extremities well.  No significant  focal weakness.  I did not do fine sensory testing for his peripheral  neuropathy.  SKIN:  Skin is without rashes.   LABORATORIES:  Laboratories reveal a white count of 15,200, hemoglobin 15.3,  platelet count 258,000.  He has 82% neutrophils, 9% lymphocytes, 9%  monocytes.  Sodium is 141, potassium 4.1, chloride 104, bicarb 24, glucose  83, BUN 56, creatinine 2.5, calcium 9.6.  Urinalysis reveals specific  gravity of 1.015, pH is 5, glucose negative, protein negative, urobilinogen  is 0.2 -- normal, negative for nitrite and leukocytes.  Cultures from  intraoperatively lavaged knee have been sent.   ASSESSMENT AND PLAN:  1.  Mild  azotemia -- we will hydrate with intravenous normal saline and      check BMET in a.m.  2.  Diabetes mellitus -- we will change from lactated Ringer's to      intravenous normal saline and continue Lantus, Glucotrol and Actos, and      use sliding-scale Novolog insulin with meals as needed.  3.  Incentive spirometry to prevent atelectasis/pneumonia.  4.  Deep venous thrombosis prophylaxis with Lovenox 40 mg subcutaneously      daily.  5.  Excessive bleeding results -- we will discontinue the Lovenox.  He is on      Plavix therapy for cerebrovascular disease.       RNG/MEDQ  D:  05/10/2004  T:  05/11/2004  Job:  [id number]   cc:   Lubertha Basque. Jerl Santos, M.D.  [address]  [place]  [place] [address]  Fax: [phone]   W. Ashley Royalty., M.D.  [address]  [place]  [place] [address]  Fax: [phone]

## 2010-12-21 NOTE — Consult Note (Signed)
NAME:  Nicholas [name], Nicholas [name] NO.:  [id number]   MEDICAL RECORD NO.:  [id number]          PATIENT TYPE:  OBV   LOCATION:  5041                         FACILITY:  [facility]   PHYSICIAN:  Consuello Bossier., M.D.DATE OF BIRTH:  07-02-1941   DATE OF CONSULTATION:  05/17/2004  DATE OF DISCHARGE:                                   CONSULTATION   I was asked to see this patient by Dr. Kevan Ny.  He is a 70 year old male who  was admitted one week ago with a history of having undergone a right knee  arthroscopy on May 03, 2004 complicated by the development of a  methicillin-resistant Staph aureus septic arthritis.  He had this debrided  and irrigated on the day of admission, May 10, 2004.  He has been noted  to have bilateral buttock pressure sores and we were asked to see the  patient because of this.  The patient has multiple other medical problems  including type 2 diabetes mellitus, coronary artery disease having had CABG  x2, hypertension, obesity, chronic obstructive pulmonary disease, mild renal  insufficiency, and peripheral neuropathy.   PHYSICAL EXAMINATION:  BUTTOCKS:  Examination shows evidence in both buttock  areas of pressure with some area of discoloration with a few areas centrally  of epidermolysis.  The underlying appears soft to palpation.   IMPRESSION:  This is a pressure sore and clinically it appears currently, at  least, to involve depth that hopefully, with avoidance of future pressure  and topical antibiotics, will heal much the way a second degree burn heals.  The actual depth is problematic at this point but my recommendations would  be daily cleansing and application of Silvadene cream and either a Xeroform  or Adaptic dressing and avoidance of any pressure by the patient laying on  his side or not putting any pressure at all on this area.  The patient is  scheduled to be discharged tomorrow and will receive home health care for  this problem  and over time it will be determined as to the actual depth and  whether further treatment is necessary.      Howa   HH/MEDQ  D:  05/17/2004  T:  05/17/2004  Job:  [id number]

## 2010-12-21 NOTE — Consult Note (Signed)
NAME:  Nicholas [name], NURSE NO.:  [id number]   MEDICAL RECORD NO.:  [id number]          PATIENT TYPE:  OUT   LOCATION:  XRAY                         FACILITY:  [facility]   PHYSICIAN:  Lebron Conners, M.D.   DATE OF BIRTH:  04-12-1941   DATE OF CONSULTATION:  08/20/2006  DATE OF DISCHARGE:                                 CONSULTATION   REASON FOR VISIT:  Follow up of recalcitrant sacral decubitus ulcer.   [name] is back today in followup.  There has been no significant  change in the ulceration, despite use of Regranex.  It does not bother  him all that much, although he says he takes some Percocet from time to  time.  He does not have too much drainage.   PHYSICAL EXAMINATION:  There is a granulated area on a crevice.  The  skin around it is healthy.  Probing it, I cannot feel any bone.   The culture which was done last visit grew multiple species, none  predominant, none dangerous.   He has been on Levaquin and that has not made much difference either.  The patient has not had any image of this area since May, at which time  there was an MRI done with some question of involvement of the coccyx.   PLAN:  I will continue Regranex.  Follow up in two weeks.  Simple  bandage covering the wound after Regranex is applied.  A plain x-ray of  the sacrum and coccyx to look for evidence of osteomyelitis.      Lebron Conners, M.D.  Electronically Signed     WB/MEDQ  D:  08/20/2006  T:  08/20/2006  Job:  [id number]

## 2010-12-21 NOTE — Assessment & Plan Note (Signed)
[facility]   NAME:  Nicholas [name], Nicholas [name]              ACCOUNT NO.:  [id number]   MEDICAL RECORD NO.:  [id number]      DATE OF BIRTH:  07-19-1941   PHYSICIAN:  Jake Shark A. Tanda Rockers, M.D. VISIT DATE:  03/13/2006                                     OFFICE VISIT   VITAL SIGNS:  Blood pressure 132/82, respirations 20, pulse rate 70, and he  is afebrile.   PURPOSE OF TODAY'S VISIT:  Mr. Wlodarczyk returns for followup of the sacral  decubitus.   WOUND EXAM:  Inspection of the sacral area shows that there is a punctate  area of approximately 1 mm that has healthy granulation.  This was discerned  by gently rubbing the wound with a Q-tip.  There are absolutely no tracts.  The wound is greatly improved.   WOUND SINCE LAST VISIT:  His wife reports that wound has had significantly  decreased drainage and there is no discernible wound.  She continues to use  the Regranex per protocol.   CHANGE IN INTERVAL MEDICAL HISTORY:  During the interim, the patient has  been admitted to the hospital with a severe pneumonia which involved an  alteration in his level of consciousness and temperature to 106.  The  patient did experience some deterioration of his wound during that  hospitalization. This is according to his wife.  He reports to the clinic  for reevaluation.   DIAGNOSIS:  Marked improvement of sacral decubitus with Regranex protocol.   TREATMENT:   ANESTHETIC USED:   TISSUE DEBRIDED:   LEVEL:   CHANGE IN MEDS:   COMPRESSION BANDAGE:   OTHER:   MANAGEMENT PLAN & GOAL:  We have instructed Mrs. Baine to continue using  the topical application of the Regranex per protocol.  We will see Mr.  Hing for evaluation in one month.  We anticipate this wound should be  completely healed.           ______________________________  Theresia Majors Tanda Rockers, M.D.     [name]  D:  03/13/2006  T:  03/13/2006  Job:  [id number]

## 2010-12-21 NOTE — Assessment & Plan Note (Signed)
[facility]   NAME:  Nicholas [name], Nicholas [name]              ACCOUNT NO.:  [id number]   MEDICAL RECORD NO.:  [id number]      DATE OF BIRTH:  [birth year]   PHYSICIAN:  Jake Shark A. Tanda Rockers, M.D.      VISIT DATE:                                   OFFICE VISIT   SUBJECTIVE:  Nicholas [name] is a 70 year old man who we follow for stage 4  sacral decubitus.  In the interim, we have treated him with continuation  of Regranex post an Apligraf application.  He has exhausted his supplies  of his Regranex and has continued for the last couple of weeks using the  antiseptic soap wash and cotton briefs as a dressing.  There has bee no  excessive drainage, malodor, pain or fever.   OBJECTIVE:  Blood pressure is 112/53, respirations 18, pulse rate 55,  temperature is 97.8.  He is accompanied by his wife.   Inspection of the wounded area shows that there is continued  contraction.  There is healthy-appearing granulation.  There is no  evidence of necrosis.  A debridement is not needed.   IMPRESSION:  Clinical improvement.   PLAN:  We will continue antiseptic soap washes daily with a clean cotton  brief as a dressing.  We will reevaluate the patient in one month.  The  wife who changes the dressing and helps with his bathing will observe  the wound daily and if she notices any deterioration, she will contact  the office for an interim appointment if needed.  We have given the  patient and the wife an opportunity to ask questions; they seem to  understand these instructions.      Harold A. Tanda Rockers, M.D.  Electronically Signed     HAN/MEDQ  D:  01/07/2007  T:  01/07/2007  Job:  [id number]

## 2010-12-21 NOTE — Cardiovascular Report (Signed)
NAME:  Nicholas [name], Nicholas [name] NO.:  [id number]   MEDICAL RECORD NO.:  [id number]                   PATIENT TYPE:  INP   LOCATION:  2901                                 FACILITY:  [facility]   PHYSICIAN:  W. Ashley Royalty., M.D.         DATE OF BIRTH:  12-22-1940   DATE OF PROCEDURE:  03/18/2002  DATE OF DISCHARGE:                              CARDIAC CATHETERIZATION   INDICATIONS FOR PROCEDURE:  A 70 year old male with unstable angina with  previous bypass grafting.   COMMENTS ABOUT PROCEDURE:  The patient tolerated the procedure well without  complications.  He was found to have significant left main coronary artery  disease and had significant catheterization in-damping with the JL5 guiding  catheter.  All the grafts were selected using a standard right catheter and  the internal mammary graft was injected.  At the end of the procedure, good  hemostasis was noted.   HEMODYNAMIC DATA:  1. Aorta post contrast 186/86.  2. LV post contrast 186/20.   ANGIOGRAPHIC DATA:  1. Left ventriculogram:  Performed in the 30-degree RAO projection.  Aortic     valve was normal.  The mitral valve was normal.  The left ventricle was     hyperdynamic.  The estimated ejection fraction was 75%.  2. Coronary arteries arise and distribute normally.  Significant     calcification is noted in the proximal left main coronary artery.     A. The left main coronary artery has a severe ostial 80% stenosis.  There        is catheter ventriculization and damping associated with it.     B. The left anterior descending is patent with moderate 40% proximal        stenosis.  This vessel is large.  A diagonal branch arises with a        severe ostial stenosis at its origin and fills through antegrade flow.        An intermedius branch is also noted.     C. The circumflex artery is occluded and fills via patent graft.     D. The right coronary artery is occluded proximally and fills  through        collaterals from the left main as well as the distal circumflex graft.     E. Sequential saphenous vein graft to the OM-1 and OM-2 is patent.  There        is a mid graft with 60% stenosis noted.  Both distal anastomotic sites        are noted to the OM-1 and OM-2.  A large collateral is seen involving        the distal right coronary artery.     F. Saphenous vein graft to diagonal is occluded.     G. Saphenous vein graft to right coronary artery is occluded.     H. Internal mammary artery is patent on the left side.  IMPRESSION:  1. Severe left main coronary artery disease.  2. Severe three-vessel coronary artery disease.  3. Preserved left ventricular function.  4.     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   RECOMMENDATIONS:  Admission to the hospital.  Consideration of bypass  grafting.                                               Darden Palmer., M.D.    WST/MEDQ  D:  03/18/2002  T:  03/21/2002  Job:  [id number]   cc:   [facility]

## 2010-12-21 NOTE — Consult Note (Signed)
[facility]  Patient:    Nicholas [name], Nicholas [name] Visit Number: [id number] MRN: [id number]          Service Type: FTC Location: FOOT Attending Physician:  Sharren Bridge Dictated by:   Sondra Come, D.O. Proc. Date: 09/24/01 Admit Date:  05/20/2001   CC:         Nicholas [name], M.D.; [facility], [address], [place], [place] [address]                          Consultation Report  Nicholas [name] returns to clinic today as scheduled for reevaluation.  He is about the same in terms of his low back pain which waxes and wanes. Currently, his pain is a 7/10 on a subjective scale and mainly worse with standing and walking and improved with sitting.  He continues using a tens unit.  He also continues using hydrocodone 5 up to five times a day as needed. He is also taking Neurontin 300 mg q.i.d.  Overall, his function and quality of life indexes have remained fairly declined and his sleep is fair.  His main problem is difficulty getting out into the community secondary to pain with ambulation.  He also has complained of some headaches over the past month which he denies at this time.  He has been evaluated by a neurologist in the past and I instructed him to follow up with neurologists if his headaches persist.  I review health and history form and 14 point review of systems. Review medication list provided by the patient.  PHYSICAL EXAMINATION  GENERAL:  Healthy male in no acute distress.  VITAL SIGNS:  Blood pressure 106/60 today and I discussed this with the patient.  Pulse 69, respirations 14 and regular.  O2 saturation 95% on room air.  NEUROLOGIC:  Cranial nerves are intact.  Manual muscle testing is 5/5 bilateral lower extremities.  Sensory examination reveals decreased light touch in a stocking distribution.  Muscle stretch reflexes are 1+/4 bilateral patellar and medial hamstrings and 0/4 bilateral Achilles.  BACK:  Decreased lumbar  lordosis with midline incisional scar.  Range of motion is guarded.  IMPRESSION: 1. 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  Overall neurogenic    claudication is improved. 2. 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 3. Facet arthropathy. 4. Status post spine surgery x6. 5. Headaches, resolved. 6. Polypharmacy.  PLAN: 1. Patient to continue with current medications as prescribed. 2. I discussed possibly obtaining a manual or motorized wheelchair for Mr.    [name] to improve his community mobility and quality of life.  He is    uncertain whether or not he wants to proceed in this direction but I have    asked him to consider this option and he agrees. 3. Patient to follow up with neurology if his headaches persist. 4. Patient to follow up with his cardiologist regarding his blood pressure. 5. Patient to return to clinic in six months or sooner as needed.  Patient was educated on the above findings and recommendations and understands.  There were no barriers to communication. Dictated by:   Sondra Come, D.O. Attending Physician:  Sharren Bridge DD:  09/24/01 TD:  09/25/01 Job: [id number]

## 2010-12-21 NOTE — Op Note (Signed)
NAME:  Nicholas [name], Nicholas [name] NO.:  [id number]   MEDICAL RECORD NO.:  [id number]          PATIENT TYPE:  INP   LOCATION:  5016                         FACILITY:  [facility]   PHYSICIAN:  Lindaann Slough, M.D.  DATE OF BIRTH:  11-May-1941   DATE OF PROCEDURE:  06/21/2004  DATE OF DISCHARGE:                                 OPERATIVE REPORT   PREPROCEDURE DIAGNOSIS:  Urinary retention.   POSTPROCEDURE DIAGNOSIS:  Urinary retention.   PROCEDURE DONE:  Cystoscopy and insertion of suprapubic Cystocath.   INDICATIONS:  The patient is a 69 year old male who was admitted with septic  knee secondary to arthroscopy.  He also developed bilateral buttock pressure  sores.  He had been unable to void after removal of the Foley catheter, and  he has been on in-and-out catheterization.  The patient denies any voiding  symptoms prior to this hospitalization.  He has been on Flomax and Avodart  and has been unable to urinate.  He is scheduled for cystoscopy and  insertion of suprapubic catheter.   After instillation of 2% Xylocaine jelly in the urethra, a flexible  cystoscope was passed in the bladder.  The patient's urethra is normal.  He  has minimal prostatic hypertrophy, and the bladder neck is open.  The  bladder is moderately trabeculated.  There is no stone or tumor in the  bladder.  The ureteral orifices are in normal position and shape with clear  efflux.  Under direct vision a #14 [place] Microvasive catheter was inserted  in the bladder.  The curl of the Microvasive catheter was formed in the  bladder by pulling on the string.  The catheter was left to clamp for  voiding trial.       MN/MEDQ  D:  06/21/2004  T:  06/22/2004  Job:  [id number]   cc:   Candyce Churn, M.D.  [address] [place]  [place] [address]  Fax: [phone]

## 2010-12-21 NOTE — H&P (Signed)
NAME:  Nicholas [name], Nicholas [name]                        ACCOUNT NO.:  [id number]   MEDICAL RECORD NO.:  [id number]                   PATIENT TYPE:  INP   LOCATION:  0101                                 FACILITY:  [facility]   PHYSICIAN:  Hal T. Stoneking, M.D.              DATE OF BIRTH:  July 02, 1941   DATE OF ADMISSION:  10/02/2002  DATE OF DISCHARGE:                                HISTORY & PHYSICAL   IDENTIFYING DATA:  Nicholas [name] is a very nice 70 year old [place] male who  has had multiple medical problems as listed below.  One week ago, Mr.  [name] had right eye surgery for glaucoma with Dr. Alden Hipp.  Soon after his  surgery, he went and ate an Egg McMuffin.  Shortly after that, he developed  persistent nausea, vomiting, and diarrhea which has continued since that  time.  He was seen in the office 10/01/02 by Dr. Kevan Ny and given Cipro and  Phenergan.  He was no better yesterday.  He went to the walk-in clinic and  was seen briefly and sent to the [facility] emergency room.  He does  complaint of persistent dull epigastric pain.  He has had a fever as high at  101.7.  He denies any hematochezia.  He has noticed decreased urination, at  least for the last 24 hours.  His last BUN was 25 in 10/03.  I do not have a  copy of the creatinine.   ALLERGIES:  No known drug allergies.   CURRENT MEDICATIONS:  Lipitor 20 mg daily, atenolol 25 mg daily, colchicine  0.6 mg b.i.d., Lasix 40 mg two twice a day, Clarinex 5 mg daily, Glucotrol  XL 5 mg b.i.d., stool softener 100 mg b.i.d., Plavix 75 mg daily, Ferrex 150  mg daily, Prevacid 30 mg daily, Lotensin 20 mg daily, disopromine 25 mg two  at bedtime, carbamazepine 200 mg three tablets at bedtime, alprazolam 1 mg  twice a day, two at bedtime, Vicodin one five times a day, Cardizem CD 240  mg a day, isosorbide 60 mg daily half a tablet, potassium 20 mEq, albuterol  inhaler p.r.n., Azmacort inhaler three puffs twice a day.  Also listed is a  Flovent  inhaler, 44 mcg, and am sure he is on this, as well, two puffs twice  a day, Trusopt 2% eye drops, one drop in the right eye twice a day, Xalatan  0.005% one drop each eye daily, Alphagan 0.15% one drop right eye twice a  day, Lantus - he states he is taking 60 units q.h.s., quinine half tablet  Mondays, Wednesdays, and Fridays, Actos 30 mg daily, Zaroxolyn 5 mg 1-2  times a week if he has edema, Neurontin 300 mg t.i.d.   PAST MEDICAL HISTORY:  1. Coronary artery disease.  He has had a CABG in 1991, and also in 8/93.  2. Diabetes mellitus diagnosed in 2001, complicated by peripheral  neuropathy.  3. Hypertension.  4. Glaucoma.  5. Gout.  6. Sleep apnea.  He uses oxygen at night.  7. He has had a TIA.  8. He has had a stroke in the past.  9. He had a recent small left subdural hematoma.  10.      Bipolar disorder.  11.      Hypercholesterolemia.  12.      GERD.  13.      Recent study showed 75% right internal carotid artery stenosis.   PREVIOUS SURGERIES:  1. He has had recent glaucoma.  2. Laser surgery on his right eye.  3. He has had six back surgeries.  4. Had a cholecystectomy.  5. He has had an appendectomy.  6. He has had orthopedic procedures on both knees, both feet, and both     elbows.   FAMILY HISTORY:  Remarkable for coronary artery disease in six brothers.   SOCIAL HISTORY:  He is married.  He has one son.  He is disabled.  He worked  for the [employer].  He stopped smoking 14 years ago. He does not  consume alcohol.   REVIEW OF SYSTEMS:  No headache, no change in his vision or hearing.  No  shortness of breath.  No chest pain.  Does complain of dull epigastric pain,  decreased urination, and diarrhea.   PHYSICAL EXAMINATION:  VITAL SIGNS:  Temperature 98.3, blood pressure  128/79, pulse 78.  HEENT:  Pupils equal, round and reactive to light.  The right sclera is  injected.  Normal mucous membranes, surprisingly moist, although he has  received  at least a liter of fluid in the emergency room.  LUNGS:  Clear.  HEART:  Regular rate and rhythm without murmur.  ABDOMEN:  Soft.  No hepatosplenomegaly or masses palpated.  Foley revealed  clear urine in the Foley bag.  NEUROLOGIC:  Nonfocal.   LABORATORY DATA:  Sodium 135, potassium 3.9, chloride 94, bicarb 28, glucose  117, BUN 68, creatinine 5, calcium 8.9, total protein 7.5.  Albumin 3.8,  SGOT 24, SGPT 20, alkaline phosphatase 107, total bilirubin 0.5, lipase 20.  Urinalysis with specific gravity of 1.029.  Negative for ketones.  There  were 30 mg/dl protein.  Negative leukocyte esterase.  White count 5800,  hemoglobin 16.4, platelet count 227,000.   ASSESSMENT:  1. Acute renal failure, especially with a normal CO2 and normal hemoglobin.     Suspect this is due to prerenal azotemia, given his history of nausea,     vomiting, and diarrhea.  2. Persistent nausea, vomiting, and diarrhea after his eye surgery, possibly     due to diabetic autonomic dysfunction, possibly gastroparesis, along with     his gastroesophageal reflux disease.  3. Gout secondary to food poisoning as it has lasted far to long.     Question bacterial gastroenteritis.  Question C. difficile.  4. Diabetes mellitus, good control.   PLAN:  Will admit, hydrate, continue his Foley at the current time.  He is  making urine.  Will hold his Lasix, iron, Lotensin, and Zaroxolyn.  Will  check a renal ultrasound if his creatinine does not come down.  Also obtain  a fractional excretion of sodium if he is oliguric in 24 hours off of his  Lasix.  [name], M.D.    HTS/MEDQ  D:  10/03/2002  T:  10/03/2002  Job:  [id number]

## 2010-12-21 NOTE — Discharge Summary (Signed)
NAME:  Nicholas [name], Nicholas [name] NO.:  [id number]   MEDICAL RECORD NO.:  [id number]          PATIENT TYPE:  INP   LOCATION:  5743                         FACILITY:  [facility]   PHYSICIAN:  Candyce Churn, M.D.DATE OF BIRTH:  [birth year]   DATE OF ADMISSION:  08/13/2004  DATE OF DISCHARGE:                                 DISCHARGE SUMMARY   DISCHARGE DIAGNOSES:  1.  Acute delirium, resolved.  2.  Sacral decubitus ulcer, stable and improving.  3.  History of methicillin resistant Staphylococcus aureus right knee septic      arthritic, October 2005, resolved.  4.  Right knee gout November 2005.  5.  Hypertension.  6.  Type 2 diabetes mellitus with retinopathy, neuropathy, and nephropathy.  7.  History of psychotic depression.  8.  Coronary artery disease with coronary artery bypass grafting times two.  9.  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  10. Benign prostatic hypertrophy.  11. Glaucoma.  12. History of cerebrovascular accident.  13. Obstructive sleep apnea and obesity hypoventilation syndrome.  14. Gastroesophageal reflux disease.  15. Constipation.  16. Urinary retention.  17. Allergic rhinitis.   DISCHARGE MEDICATIONS:  1.  Proscar 5 mg a day.  2.  Diltiazem SR 240 mg daily.  3.  Flomax 0.4 mg twice daily.  4.  Lipitor 40 mg daily.  5.  Atenolol 50 mg twice daily.  6.  Nexium 40 mg daily.  7.  Claritin 10 mg daily.  8.  Allopurinol 300 mg daily.  9.  Imdur 30 mg daily.  10. Plavix 75 mg daily.  11. Lotensin 40 mg daily.  12. Glucotrol XL 5 mg.  13. Lantus 30 units subcu daily.  14. Multivitamin one daily.  15. Xanax 0.25 mg three times daily--hold for sedation.  16. Depakote ER 250 mg at bedtime.  17. Zyprexa 10 to 15 mg by mouth at bedtime.  18. Restoril 15 mg as needed for insomnia if not asleep with Zyprexa.   ALLERGIES:  No known drug allergies.   CONSULTATIONS:  Plastics consultation/wound care, Etter Sjogren, M.D.  and Pilar Grammes.   HOSPITAL COURSE:  [name] is a very pleasant but unfortunate 70-  year-old male with multiple medical problems. He was discharged from [facility] after a several week stay there and following a prolonged stay at  [facility] for severe sacral decubitus.  He is currently receiving  wound VAC therapy.  After being discharged from [facility], he was  doing well at home for several weeks and then became agitated and started  hallucinating.   He has a history of psychotic depression and had been weaned off psychiatric  medications while at [facility].  Initially, while hospitalized,  during this admission he was agitated and confused.  He then was started on  Tegretol and became overly sedated for more than 48 hours.  It was felt that  the Tegretol probably increased his active level of benzodiazepines causing  the severe sedation.  After trials of medications including Tegretol and  Risperdal and varying doses of Xanax, the  discharge medications above seem  to be working well and he has only had minimal confusion at night.   He is essentially back to baseline from a psychiatric standpoint and is  improving daily.  We will consider increasing Depakote ER for further mood  stabilization if necessary.   Problem #2:  Sacral decubiti.  Seen in consultation by Dr. Odis Luster and a  wound VAC was monitored with pulse lavage while hospitalized with continued  improvement in the wound status.   Problem #3:  Urinary retention.  The patient does tend to develop urinary  retention when acutely ill.  This did occur but he is now voiding on his own  without a Foley catheter.  Urinary retention felt to be multifactorial and  also secondary to benign prostatic hypertrophy.   Problem #4:  Urinary tract infection.  The patient did have BRE in his  urine, but on initial culture from August 13, 2004.  Culture performed  August 15, 2004 showed reduced  counts of colonies and he does not have any  systemic symptoms and we will monitor these closely.  Any fever of 101 or  greater or dysuria or abdominal pain he is to contact us immediately.   In terms of his diabetes, this is well controlled while hospitalized.   In terms of his hypertension, this is well controlled while hospitalized.   The patient will need ongoing physical therapy and wound care and we will  arrange for these as outpatient.   Condition on discharge was much improved.   Discharge vital signs include the following.  Blood pressure 121/52, pulse  68 and regular, respiratory rate 20 and easy.  Temperature 98.8.  Oxygen  saturations on 3 L at 96%.  (He is on chronic O2 at home nocturnally).   DISCHARGE LABS:  CBC on August 20, 2004 revealed white blood cell count  9600, hemoglobin 10.7, platelet count 351,000.  BMET on August 20, 2004  reveals sodium 142, potassium 3.6, chloride 111, bicarb 25, BUN 5,  creatinine 0.8, glucose 106.  Liver function tests on August 15, 2004  revealed ALT 14, AST 14, alk phos 65, total bili 0.4 and albumin low at 2.3.  Total protein 5.4.  Urine culture from 11/06 revealed 15,000 colonies of  BRE.  Wound culture from August 14, 2004 revealed methicillin resistant  Staphylococcus aureus with only rare colonies being detected on culture.   X-rays obtained while admitted included a CT of the head on August 16, 2004  which was nonacute.   The patient will be followed up in the office in one to two weeks.      RNG/MEDQ  D:  08/22/2004  T:  08/22/2004  Job:  [id number]

## 2010-12-21 NOTE — Consult Note (Signed)
NAME:  Nicholas [name], Nicholas [name] NO.:  [id number]   MEDICAL RECORD NO.:  [id number]          PATIENT TYPE:  INP   LOCATION:  5016                         FACILITY:  [facility]   PHYSICIAN:  Lindaann Slough, M.D.  DATE OF BIRTH:  July 28, 1941   DATE OF CONSULTATION:  06/21/2004  DATE OF DISCHARGE:                                   CONSULTATION   REQUESTING PHYSICIAN:  Dr. Candyce Churn.   REASON FOR CONSULTATION:  Inability to urinate.   HISTORY OF PRESENT ILLNESS:  The patient is a 70 year old male who had right  knee arthroscopy on May 03, 2004, complicated by MRSA septic  arthritis.  He subsequently developed bilateral buttock pressure sores.  He  has been having difficulty voiding.  He was treated with Avodart and Flomax  and has still been unable to urinate, he is a known diabetic, and I was then  asked to see the patient for further treatment of his urinary retention.   PAST MEDICAL HISTORY:  Past medical history is positive for:  1.  Diabetes.  2.  Septic knee.  3.  Bilateral buttock decubitus.   MEDICATIONS:  He is on:  1.  Xalatan eye drops.  2.  Potassium chloride.  3.  Claritin.  4.  Cardizem 240 mg.  5.  Lipitor 40 mg.  6.  Colace 100 mg.  7.  Senokot-S 2 tablets p.r.n. for diarrhea.  8.  Flomax 0.4 mg.  9.  Tenormin 25 mg.  10. Flovent 3 puffs twice a day.  11. Tegretol 600 mg h.s.  12. Xanax 1 mg 3 times a day.  13. Reglan 5 mg 3 times a day.  14. Insulin.  15. Zyloprim.   SOCIAL HISTORY:  He is married, has 1 son, is disabled, stopped smoking 15  years ago and does not drink.   FAMILY HISTORY:  There is a family history of heart disease.   ALLERGIES:  No known drug allergies.   PHYSICAL EXAMINATION:  ABDOMEN:  On physical examination, his abdomen is  soft, nontender.  He has no CVA tenderness and kidneys unremarkable, no  hepatomegaly, no splenomegaly.  Bladder is not distended.  GU:  Penis and meatus are normal.  Scrotum is normal in  appearance, no  hydrocele, no testicular mass.  RECTAL:  Examination is deferred at this time.   IMPRESSION:  Urinary retention.   PLAN:  Cystoscopy and insertion of suprapubic tube.  We will clamp the  suprapubic catheter and if the patient is unable to void, we will drain the  bladder through the suprapubic catheter and clamp it again, and we will  leave the catheter in place until the patient is able to void.       MN/MEDQ  D:  06/21/2004  T:  06/22/2004  Job:  [id number]   cc:   Candyce Churn, M.D.  [address] [place]  [place] [address]  Fax: [phone]

## 2010-12-21 NOTE — H&P (Signed)
NAME:  Nicholas [name], Nicholas [name] NO.:  [id number]   MEDICAL RECORD NO.:  [id number]          PATIENT TYPE:  INP   LOCATION:  5743                         FACILITY:  [facility]   PHYSICIAN:  Candyce Churn, M.D.DATE OF BIRTH:  09/08/1940   DATE OF ADMISSION:  08/13/2004  DATE OF DISCHARGE:  08/22/2004                                HISTORY & PHYSICAL   DATE OF WRITTEN HISTORY AND PHYSICAL:  08/13/04.   DATE OF DICTATED HISTORY AND PHYSICAL:  10/20/04.   CHIEF COMPLAINT:  Agitation and delirium.   HISTORY OF PRESENT ILLNESS:  Nicholas [name] is a pleasant, 70 year old male  with multiple medical issues, and severe sacral decubitus ulcer since  October of 2005.  He was treated at [facility] for an  extended period of time in the Fall of 2005, and then transferred to [facility]  for several weeks, and then home.  He had been doing relatively well with  every-other-day with wound checks with a wound vac per Home Health nursing.  The patient had a history of psychotic depression, and had been off  psychiatric meds since [place] in November, 2005.  His latest psychiatric  medication- Tegretol was discontinued after leaving [facility], and while at [facility] in November of 2005.  Perphenazine was  discontinued and desipramine prior to discharge from [facility] on  06/25/04.  His wife has noted increase in agitation in the past week, with  cursing, aggressive behavior over the week and prior to admission.  He  presents to the [facility] with moderate confusion,  oriented to name and date, but not place.  The patient is also dehydrated.  He has a foul-smelling wound vac in place in his sacral decubitus.  The  patient was admitted to treat confusion, dehydration and sacral decubitus.   ADMISSION MEDICATIONS:  1.  Proscar 5 mg daily.  2.  Cardizem slow release, 240 mg daily.  3.  Flomax 0.4 mg p.o. b.i.d.  4.  Xanax 1 mg  t.i.d.  5.  Lipitor 40 mg daily.  6.  Atenolol 50 mg p.o. b.i.d.  7.  Betaxolol in both eyes, 0.25%, one drop q.12h.  8.  Nexium 40 mg daily.  9.  Clarinex 5 mg daily.  10. Lantus insulin 30 units subcu q. Daily.  11. Allopurinol 300 mg daily.  12. Imdur 60 mg, one half tablet p.o. daily.  13. Plavix 75 mg daily.  14. Patanol eye drops, 0.1%, one drop in both eyes q.12h. p.r.n.  15. Latanoprost 0.005%, one drop in both eyes at night.  16. Vicodin 5/500, one tablet five times daily.  17. Restoril 30 mg at night.  18. Benazepril 40 mg daily.  19. Glucotrol XL, 5 mg daily.   PAST MEDICAL HISTORY:  1.  Severe sacral decubitus as per HPI.  2.  Right knee septic arthritis secondary to MRSA, 10/05.  3.  Right knee gout, 11/05.  4.  Hypertension.  5.  Type 2 diabetes mellitus with retinopathy, neuropathy and gastroparesis.  6.  History of psychotic depression.  7.  Coronary artery disease with coronary artery bypass grafting twice.  8.  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  9.  Benign prostatic hypertrophy.  10. Glaucoma.  11. History of cerebrovascular accident.  12. Obstructive sleep apnea and obesity, hypoventilation syndrome.  13. Gastroesophageal reflux disease.  14. Constipation.  15. Urinary retention.  16. Allergic rhinitis.   FAMILY HISTORY:  See prior HPI's.   SOCIAL HISTORY:  The patient is disabled, lives at home, is married, he is a  [place].   REVIEW OF SYSTEMS:  Denies fevers, chills or focal pain.  He says, I feel  fine.  He denies shortness of breath, headache, abdominal pain or buttock  pain.  His diet is supposed to be no concentrated sweets.   PHYSICAL EXAMINATION:  GENERAL:  An elderly male, in no acute distress.  He  is not agitated.  VITAL SIGNS:  Temperature 97.7, pulse 60 and regular.  Blood pressure  100/64.  Respiratory rate is 14 and nonlabored.  O2 saturation 99% on room  air.  HEENT:  Exam reveals him to be  atraumatic, normocephalic.  Oropharynx is  dry.  Tongue is parched and shriveled.  NECK:  Supple without JVD.  CHEST:  Clear to auscultation.  CARDIAC EXAM:  A regular rhythm, no murmurs or gallops.  ABDOMEN:  Soft, nontender.  Bowel sounds are normal.  BACK:  Exam is within normal limits.  BUTTOCKS:  Exam reveals a large sacral decubitus with wound vac packing in  place.  It is malodorous.  There is minimal exudate.  EXTREMITIES:  Without edema.  NEUROLOGICAL:  Reveals him to be oriented x1 to 2.  He is oriented to name  and to date, but may have been told that just prior to me talking to him.  He was not aware of where he was.  He had a nonfocal exam.  He did think he  was at home. He does move all extremities well.   LABORATORY DATA:  White blood cell count 8000, hemoglobin 11.8, platelet  count 431,000, normal differential.  White cell count, sodium 141, potassium  4.3, chloride 108, bicarbonate 25, BUN 23, creatinine 1.5, glucose 111,  albumin 2.9.  LFT's are normal. Urine cultures and blood cultures x2 are  pending.   ASSESSMENT:  A 70 year old male with multiple medical issues with  intermittent confusion, agitation, dehydration, non azotemia, persistent  sacral decubitus.   Confusion may be part of a mild psychosis or secondary to a metabolic  encephalopathy.  Also, acute dehydration factor may be contributing. Absence  of fever and high white count make significant infection unlikely.  If  purely psychiatric, would like to give only minimal meds to achieve calm and  coherent state.  He did very well with Tegretol only in November of 2005,  and Xanax.   PLAN:  1.  Neurological:  Restart Tegretol.  Continue Xanax 1 mg p.o. t.i.d. Check      head CT if confusion persists longer than 24-48 hours and after      rehydration.  Check ammonia level.  Keep sitter or family member present     at all times.  Follow up on blood and urine cultures.  2.  Decubitus.  Have wound-care  team assess.  Remove vac tonight and use      saline soaks, gauze, wet to dry tonight.  Will culture wound.  Rocephin      1 gram given in ER.  3.  Diabetes mellitus.  Treat  with sliding scale Novolin, Lantus and      glyburide.  Check hemoglobin A1c.  4.  Hypertension/ azotemia.  Hold benazepril for now. Will treat with IV      normal saline at 150 an hour.  Check daily BMET.  5.  Coronary artery disease, cerebrovascular disease, pvd :  Continue Imdur,      Plavix, diltiazem and atenolol.  6.  Hypercholesterolemia.  Continue on Lipitor.   I discussed the case with the patient, wife and other family members.  They  agree and consent with the current workup and care plans.      RNG/MEDQ  D:  10/20/2004  T:  10/21/2004  Job:  [id number]

## 2010-12-21 NOTE — H&P (Signed)
NAME:  Nicholas [name], Nicholas [name] NO.:  [id number]   MEDICAL RECORD NO.:  [id number]                   PATIENT TYPE:  EMS   LOCATION:  MINO                                 FACILITY:  [facility]   PHYSICIAN:  Cassell Clement, M.D.              DATE OF BIRTH:  22-Aug-1940   DATE OF ADMISSION:  04/15/2003  DATE OF DISCHARGE:                                HISTORY & PHYSICAL   CHIEF COMPLAINT:  Chest pain.   HISTORY OF PRESENT ILLNESS:  This is a 70 year old native American gentleman  admitted with substernal chest pain which occurred while he was chopping  chicken in preparation for a church reunion on Sunday. The pain began and  lasted about 1 hour. It was substernal and did not radiate. It was  associated with sweating and diaphoresis and dyspnea but no nausea or  vomiting. The pain resolved after he got to the emergency room and was  placed on oxygen and began to diurese and was placed on IV nitroglycerin and  heparin.   The patient has a history of known coronary artery disease. We do not have  his old charts available for the details, but essentially he had his 1st  coronary artery bypass graft in 1991 and he had a redo coronary artery  bypass graft in August 2003. He states that 2 hours after going home from  his 2nd operation  he had  a stroke and a seizure at home and had to return  to the hospital for  management  of the stroke.   PAST MEDICAL HISTORY:  1. He is a diabetic managed by Dr. Kevan Ny.  2. History of depression and is on multiple medications followed  by     psychiatry.  3. History of glaucoma and is on 4 different types of eye drops.  4. History of chronic obstructive pulmonary disease and is on inhalers.  5. He brought with him an exhaustive medical list.   ALLERGIES:  He is allergic to no medicines that he is aware of.   PAST SURGICAL HISTORY:  He has had multiple surgeries  including  5 back  operations, gallbladder, appendectomy, coronary  bypass twice and multiple  knee and foot operations.   SOCIAL HISTORY:  He is on disability from the city of [place] because of  his back operations and heart trouble.   REVIEW OF SYSTEMS:  He denies any change in bowel habits, hematochezia or  melena. He denies any dysuria. He denies any sputum  production or  hemoptysis. The remainder of the review of systems is negative in detail.   PHYSICAL EXAMINATION:  GENERAL:  Weight  not  recorded. General appearance  reveals a large native  American gentleman in no acute distress.  VITAL SIGNS:  Blood pressure 120/62, pulse 67, respirations normal.  SKIN:  Warm and dry.  HEENT:  Unremarkable.  NECK:  Carotids normal upstroke, no bruits. Jugular  venous pressure normal.  CHEST:  Mild basilar rales.  HEART:  No S3, no murmur, no rub.  ABDOMEN: Obese, nontender, no mass.  EXTREMITIES:  2+ peripheral edema.   LABORATORY DATA:  His electrocardiogram shows slight increase in ST  elevation V1 through V4 suggesting early repolarization. There are no  definite ischemic changes present. His chest x-ray shows cardiomegaly  but  no active disease.   His initial cardiac enzymes are negative. White count is 7000, hemoglobin  14.1. Protime 12.6. Blood sugar 130, BUN 26, creatinine 1.1, potassium 3.8,  sodium 135. Liver function tests normal. Dilantin level less than 2.5 and he  states he is not taking Dilantin any more. Magnesium level 1.7.   IMPRESSION:  1. Chest pain, rule out myocardial infarction.  2. Situational stress dealing with his son which has been quite troubling to     him in the last 3 days.  3. Diabetes mellitus.  4. Hypertension.  5. Hyperlipidemia.  6. Glaucoma.  7. Chronic obstructive pulmonary disease.   DISPOSITION:  We are admitting to telemetry, getting serial enzymes and EKG.  He  will be  on IV nitroglycerin and IV heparin. See written orders for  further orders.                                                   Cassell Clement, M.D.    TB/MEDQ  D:  04/15/2003  T:  04/15/2003  Job:  [id number]   cc:   Darden Palmer., M.D.  [address]  [place]  [place] [address]  Fax: [phone]   Daine Floras, M.D.  [address]  [place]  [place] [address]  Fax: [phone]   Candyce Churn, M.D.  [address] [place]  [place] [address]  Fax: [phone]   Petra Kuba, M.D.  [address], [address]  [place]  [place] [address]  Fax: [phone]   Doris Cheadle. Dione Booze, M.D.  [phone] [address]  [place]  [place] [address]  Fax: [phone]

## 2010-12-21 NOTE — Consult Note (Signed)
NAME:  Nicholas [name], Nicholas [name] NO.:  [id number]   MEDICAL RECORD NO.:  [id number]                   PATIENT TYPE:  INP   LOCATION:  3036                                 FACILITY:  [facility]   PHYSICIAN:  Hewitt Shorts, M.D.            DATE OF BIRTH:  09-Mar-1941   DATE OF CONSULTATION:  04/02/2002  DATE OF DISCHARGE:                                   CONSULTATION   REQUESTING PHYSICIANS:  1. Dr. Gwenith Daily. Gerhardt.  2. Dr. Ward Givens.   HISTORY OF PRESENT ILLNESS:  The patient is a 70 year old right-handed man  who was discharged earlier today from the cardiovascular-thoracic surgery  service and who is nine days status post open heart surgery for a redo CABG.  The patient had initially been hospitalized by Dr. Lacretia Nicks. Viann Fish, was  worked up and subsequently taken to surgery.   During his postoperative course, the patient had several episodes of  weakness and/or numbness involving the right upper extremity and he was  started on Plavix by CVTS and neurology consultation was obtained from Dr.  Casimiro Needle L. Reynolds.  Dr. Thad Ranger recommended continuing Plavix as well as  aspirin.  The patient was seen by Dr. Thad Ranger yesterday and the patient was  discharged by CVTS today.  The patient returned to [facility] Emergency Room, being brought in by his wife, because of a speech  arrest at home.  The patient's speech has since recovered, but the patient  notes he could understand but he had no ability to express himself.   Notably, no workup was performed to evaluate the episodic right upper  extremity weakness/numbness that was occurring during the post-CABG course.   His history is notable for TIAs and/or strokes in the past.  He has had  episodes apparently at times of left upper extremity symptoms and at other  times of speech difficulties; these were occurring about three years ago,  and he apparently was treated with Plavix by his  primary physician, Dr.  Barbette Or.   The patient was evaluated in the emergency room; Dr. Ramon Dredge B. Gerhardt left  orders for this evaluation which included a CT scan of the brain without  contrast.  This revealed a very small mixed-density left parietal subdural  hematoma without any mass effect or midline shift.  Dr. Tyrone Sage requested  that Dr. Lynelle Doctor, the emergency room physician, request a neurosurgery  consultation as well as a neurology consultation.  Neurology consultation is  pending at this time.   PAST MEDICAL HISTORY:  Past medical history is notable for a history of  atherosclerotic cardiovascular disease, status post a CABG in 1992 and again  nine days ago.  He also has a history notable for diabetes, hyperlipidemia,  peptic ulcer disease, gastroesophageal reflux disease and hypertension.   PAST SURGICAL HISTORY:  Previous surgeries include appendectomy, right  mastoid surgery, cholecystectomy and CABG  x2.   FAMILY HISTORY:  Family history is notable for significant heart disease.   SOCIAL HISTORY:  The patient formerly worked for the [employer] and  the [employer].  He is on disability.  He had smoked a pack a day up  until 1993.   REVIEW OF SYSTEMS:  Review of systems is notable for those difficulties  described above but is otherwise unremarkable at this time.   PHYSICAL EXAMINATION:  GENERAL:  The patient is an obese man in no acute  distress.  VITAL SIGNS:  His temperature is 98.6, pulse 101, blood pressure 145/66,  respiratory rate 18.  NEUROLOGIC:  Mental status shows he is awake, alert.  He is oriented to his  name, [facility] and April 02, 2002.  His speech is fluent.  He  had good comprehension, naming and repetition.  Cranial nerves show his  pupils are equal, round and reactive to light and about 3 to 4 mm.  Extraocular movements are intact.  Facial sensation is intact.  Facial  movement is symmetrical.  Hearing is intact  bilaterally.  Palatal movement  is symmetrical, shoulder shrug is symmetrical and his tongue is midline.  Motor examination shows 5/5 strength in the upper and lower extremities.  He  has no drift to the upper extremities.  Sensation is intact to pinprick  through his extremities and reflexes are symmetrical in the upper and lower  extremities.   IMPRESSION:  1. Left parietal, mixed-density very small subdural hematoma without mass     effect or shift.  I tend to suspect that this lesion is asymptomatic,     although there is a remote possibility that it might be a precipitating     cause of seizure activity.  Notably, the patient has had no evidence of     motor seizure activity.  2. Episodic left hemispheric dysfunction which is probably due to cerebral     ischemia, although seizure activity has not been ruled out.  3. Numerous medical problems as outlined above.  4. Nine days status post coronary artery bypass graft (redo).   RECOMMENDATIONS:  1. At this time, because of the patient's subdural hematoma, no     anticoagulation such as heparin, Lovenox, Coumadin or other such agents,     nor any antiplatelet agent such as Plavix, aspirin or other such agents,     or any other agents that would affect hemostasis or clotting should be     administered.  2. Followup CAT scan of the brain without contrast in the morning will be     necessary as well as ongoing neuro-checks q.2h.  The patient should be     monitored in an intensive care unit, ideally, the neuroscience unit.  3. Cerebrovascular disease workup which might possibly include, but not be     limited to, carotid Dopplers, carotid angiogram, MRI of the brain,     echocardiogram, EEG and so on; the extent of this will need to be     directed by the     neurologist.  4. We will follow the patient as a Research scientist (medical).  The patient is to be seen by    neurology and they, along with CVTS, can decide between them which of     those two  services will admit the patient.  Hewitt Shorts, M.D.    RWN/MEDQ  D:  04/02/2002  T:  04/05/2002  Job:  [id number]   cc:   Gwenith Daily. Tyrone Sage, M.D.  [address]  [place]  [place] [address]  Fax: [phone]   W. Ashley Royalty., M.D.  [address]  [place]  [place] [address]  Fax: [phone]   Barbette Or, M.D.

## 2010-12-21 NOTE — Discharge Summary (Signed)
NAME:  Nicholas [name], Nicholas [name] NO.:  [id number]   MEDICAL RECORD NO.:  [id number]          PATIENT TYPE:  OBV   LOCATION:  5041                         FACILITY:  [facility]   PHYSICIAN:  Candyce Churn, M.D.DATE OF BIRTH:  07-09-1941   DATE OF ADMISSION:  05/10/2004  DATE OF DISCHARGE:  05/20/2004                                 DISCHARGE SUMMARY   DISCHARGE DIAGNOSES:  1.  Methicillin-resistant Staphylococcus aureus right knee septic arthritis,      improving.  2.  Bilateral buttock decubitus, stage 2 and improving.  3.  Urinary retention, improving.  4.  Type 2 diabetes mellitus.  5.  Hypertension.  6.  Coronary artery disease with coronary artery bypass grafting x2.  7.  Obesity.  8.  Chronic obstructive pulmonary disease.  9.  Asthma.  10. Mild renal insufficiency, resolved; with creatinine at 1.2 on May 19, 2004.  11. Glaucoma.  12. Depression/anxiety.  13. History of cerebrovascular accident, with minimal residual deficit.  14. Hiatal hernia with gastroesophageal reflux disease.  15. Obstructive sleep apnea, but does not tolerate CPAP.  16. Peripheral neuropathy.   MEDICATIONS:  1.  Alamast eye drops 0.1%, one drop in each eye b.i.d.  2.  Beta Optic 0.25%, one drop in each eye b.i.d.  3.  Allegra 180 mg q.d.  4.  Cardia XT 240 mg q.d.  5.  Benazepril 40 mg q.d.  6.  Zaroxolyn 5 mg q. weekly.  7.  Actos 30 mg q.d.  8.  Quinine sulfate 260 mg 1/2 tablet Monday, Wednesday and Friday q.h.s.  9.  Lantus 30 units subcutaneously daily.  10. Imdur 30 mg q.d.  11. Atenolol 25 mg b.i.d.  12. Glucotrol XL 5 mg b.i.d.  13. Plavix 75 mg q.d.  14. Azmacort inhaler three puffs b.i.d.  15. Lipitor 40 mg q.d.  16. Sublingual nitroglycerin 0.4 mg p.r.n. chest pain.  17. Vicodin 5/500 mg one tablet p.o. five times/day.  18. Colace 100 mg p.o. b.i.d.  19. Silvadene cream applied b.i.d. to decubitus, after rinsing with Betadine      mixed with soapy water to  decubitus b.i.d.  20. Senokot S two tablets p.o. at night, but hold for diarrhea.  21. Flomax 0.4 mg p.o. q.h.s.  22. Xalatan ophthalmic solution .005% one drop at night in each eye.  23. Neurontin 300 mg p.o. t.i.d.  24. Potassium 20 mEq q.d.  25. Vancomycin 1500 mg infused per PICC daily, per home health nurse for 18      days after discharge.  26. Xanax 1 mg p.o. t.i.d.   PROCEDURES:  Right knee lavage, performed on May 10, 2004 by Dr. Marcene Corning.   CONSULTATIONS:  Wound care consult, per Dr. Stephens November, May 17, 2004.  Infectious disease consult, per Dr. Cliffton Asters, May 11, 2004.   HOSPITAL COURSE:  Mr. Bertel Venard is a very pleasant 70 year old male  with multiple medical problems, who had undergone arthroscopic right knee  surgery approximately one week prior to admission.  Three days after surgery  he started to develop pain  in his right knee, and was evaluated by Dr. Marcene Corning.  He had 3+ effusion and marked pain in the right knee.  He had  255,000 white cells as well as gout, crystals and bacteria in the aspirate.   Blood culture ultimately grew out MRSA, sensitive to vancomycin.   The patient steadily improved in terms of his knee infection, but  approximately three days into his admission he started to become confused  and diaphoretic.  With resumption of his usual Xanax therapy, his confusion  resolved.   While hospitalized he did develop a stage 2 decubitus ulcer over his  buttocks.  This is improving at the time of discharge with Betadine and  soapy water rinses, then dressed with Silvadene cream.   DISPOSITION:  By discharge the patient was much improved, but still not able  to ambulate well.  He will have home PT, OT and nursing visits for  Vancomycin administration and for strengthening.   FOLLOWUP:  I will plan to see him in my office in several weeks.  He needs  18 more days of Vancomycin therapy.      Robe   RNG/MEDQ  D:   05/20/2004  T:  05/20/2004  Job:  [id number]   cc:   Cliffton Asters, M.D.  [address] [place]  [place] [address]  Fax: [phone]   Lubertha Basque. Jerl Santos, M.D.  [address]  [place]  [place] [address]  Fax: [phone]

## 2010-12-21 NOTE — Assessment & Plan Note (Signed)
[facility]   NAME:  Nicholas [name], Nicholas [name]              ACCOUNT NO.:  [id number]   MEDICAL RECORD NO.:  [id number]      DATE OF BIRTH:  02/20/1941   PHYSICIAN:  Jake Shark A. Tanda Rockers, M.D. VISIT DATE:  09/04/2006                                   OFFICE VISIT   SUBJECTIVE:  Nicholas [name] is a 70 year old man who we have followed for  a stage 4 sacral decubitus.  We have treated him with serial  debridements and topical Regranex.  He has denied interim drainage,  malodor, excessive pain or fever.   OBJECTIVE:  Blood pressure is 133/69.  Respirations 16.  Pulse rate 68.  Temperature 97.5.  Inspection of the wound shows that there has been  some minimum contraction.  There is easy friability of the granulating  tissue.  In the interim, x-ray shows no significant changes from a  previous x-ray performed approximately six months ago.  There is  definitely no evidence of penetrating osteomyelitis.   ASSESSMENT:  Slow healing wound.   PLAN:  We will continue the Regranex protocol b.i.d.  We will reevaluate  the patient in one month.  We will see him in the interim if there  develops fever, drainage or excessive pain, drainage or malodor.           ______________________________  Theresia Majors Tanda Rockers, M.D.     [name]  D:  09/04/2006  T:  09/05/2006  Job:  [id number]

## 2010-12-21 NOTE — Consult Note (Signed)
NAME:  Nicholas [name], Nicholas [name] NO.:  [id number]   MEDICAL RECORD NO.:  [id number]                   PATIENT TYPE:  INP   LOCATION:  2019                                 FACILITY:  [facility]   PHYSICIAN:  Casimiro Needle L. Thad Ranger, M.D.           DATE OF BIRTH:  02/07/1941   DATE OF CONSULTATION:  DATE OF DISCHARGE:                                   CONSULTATION   REASON FOR EVALUATION:  Right arm numbness and suspected TIA.   HISTORY OF PRESENT ILLNESS:  This is the initial inpatient consultation  evaluation of this 70 year old man with numerous past medical problems, who  was admitted on 03/18/2002, after undergoing elective cardiac  catheterization, which demonstrated three vessel disease with preserved LV  function.  He has a history of coronary artery disease with bypass grafting  in 1992, and it was recommended that he undergo a second bypass procedure at  that time.  He underwent the procedure on 03/24/2002 and has done well,  although, he has had some postoperative fevers of uncertain etiology.  He  was not having neurological difficulties until yesterday, when in the  afternoon and again in the evening he noted brief episodes lasting about 10  or 15 minutes in which his right hand seemed numb and he seemed unable to  grip things with his hand or use his hand very well.  He had another episode  this morning and consultation was requested.  The patient reports that he  had something a little similar to this when he had a previous stroke three  years ago with tingling and numbness in the right hand, although, he did not  have the stuttering symptoms at that time.  The symptoms simply came on and  stayed.  This time he is experiencing more of numbness without paresthesias.  _____________ hand gets weak.  He denied any associated symptoms with his  right lower extremity or any changes he is aware of in his face or in his  speech.  There is no associated headache,  dysphasia, diplopia, blurry  vision, and no alteration of consciousness.   PAST MEDICAL HISTORY:  Remarkable for previous left brain stroke 3 years  ago, which left him with a mild hemiparesis and hemisensory symptoms on the  right and a mild aphasia.  He also has coronary artery disease status-post  CABG times 2, hypertension for 30 years, diabetes for about 3 years with no  neuropathy and retinopathy, history of a psychiatric disorder including  anxiety and depression and possibly a thought disorder as well,  dyslipidemia, gastroesophageal reflux disease, glaucoma, obesity, and sleep  apnea.  He has had numerous surgeries in the past.   FAMILY HISTORY:  Remarkable for coronary artery disease in both parents and  a brother.   SOCIAL HISTORY:  He is married and is on disability.  He formerly worked in  Materials engineer.  He quit smoking in  1993.   ALLERGIES:  NO TRUE DRUG ALLERGIES, BUT HE IS INTOLERANT TO ACE INHIBITORS.   MEDICATIONS:  Prior to admission, he was taking Albuterol and Nasacort  inhalers, Plavix, Glucotrol, Claritin, Lasix, Atenolol, Ismo, Lipitor,  Cardizem, Tegretol, Azipramine, Trilafon, Lotensin, Prevacid, Trusopt  Alphagan, Xalatan eye drops, Lantus, Xanax, and Vicodin p.r.n.  His Plavix  has been held during the hospitalization, although, he is on low dose  Aspirin.   REVIEW OF SYSTEMS:  Fever as above.  He has had no weight changes.  Otherwise per HPI and consultant notes, which are remarkable for depression,  peripheral neuropathy, migraines, stomach ulcers, mild constipation, urinary  frequency, lower extremity pain, also per nursing notes.   PHYSICAL EXAMINATION:  VITAL SIGNS:  Temperature 100.1, blood pressure  140/70, pulse 105, respirations 20.  GENERAL:  He is alert and in no distress.  Speech is fluent and mildly  dysarthric.  HEAD:  Cranial nerves normocephalic and atraumatic.  Sclerae are nonicteric.  Oropharynx is clear.  NECK:  Supple  without carotid bruits.  CHEST:  Clear to auscultation bilaterally.  HEART:  Regular rate and rhythm without murmurs.  ABDOMEN:  Soft, slightly distended, and nontender.  EXTREMITIES:  2+ edema and diminished dorsalis pedis pulses.  NEUROLOGIC:  Cranial nerves.  He is awake and alert and fully appropriate.  Speech is mildly dysarthric as before and he has a little bit of difficulty  with repeating a phrase, although, naming is intact.  Pictures, names,  concentration, and fund of knowledge are all adequate.  FUNDUSCOPIC:  Benign.  Pupils equal and reactive.  Extraocular movements  normal.  No nystagmus.  Visual fields full to confrontation.  Face, tongue  and palate move normally and symmetrically.  There is decreased pinprick  sensation on the right side of the face.  There is a little bit of hearing  loss in the right compared to the left.  Shoulder shrug strength is normal.  MOTOR:  Normal bulk and tone.  There is a slight weakness of the right hip  flexors, otherwise normal strength throughout.  SENSATION:  There is diminished pinprick sensation in the right upper and  lower extremities, as well as diminished pinprick over both feet in  symmetric distribution.  Reflexes are slightly brisk in the right upper  extremity and there is a Hoffman sign, absent at the ankles.  Toes are  downgoing.  Finger-to-nose is performed well.  His gait is normal, but he  has difficulty toe walking and standing with a narrow stance.   IMPRESSION:  A 70 year old man with multiple medical risk factors including  hypertension, diabetes, hyperlipidemia, and previous stroke who now seems to  be experiencing small vessel transient ischemic attacks involving the left  subcortex.  This is likely due to small vessel disease.    RECOMMENDATIONS:  I would suggest restarting Plavix 75 mg q.d. and  continuing Aspirin at present dose.  He also has ongoing excellent control of his risk factors.  I would suggest  promoting hydration and if necessary  due to persistent anemia perhaps increasing oxygen carrying capacity in the  blood by transfusion.  Hopefully though the antiplatelet therapy will cause  these symptoms to settle down.  We will follow with you.  Thank you for the  consult.  Michael L. Thad Ranger, M.D.    MLR/MEDQ  D:  04/01/2002  T:  04/04/2002  Job:  [id number]   cc:   W. Ashley Royalty., M.D.  [address]  [place]  [place] [address]  Fax: [phone]   Mikey Bussing, M.D.  [address]  [place]  [place] [address]  Fax: [phone]   Jerl Santos, M.D.

## 2010-12-21 NOTE — Consult Note (Signed)
NAME:  Nicholas [name], Nicholas [name] NO.:  [id number]   MEDICAL RECORD NO.:  [id number]                   PATIENT TYPE:  INP   LOCATION:  2901                                 FACILITY:  [facility]   PHYSICIAN:  Jerl Santos, M.D.              DATE OF BIRTH:  1941/05/19   DATE OF CONSULTATION:  DATE OF DISCHARGE:                          INFECTIOUS DISEASE CONSULTATION   PROBLEM LIST:  1. Coronary artery disease.  In 1992 the patient underwent CABG x3 after     failing medical therapy.  For several weeks prior to admission the     patient was taking an increasing number of nitroglycerin tablets     sublingually, on the order of five to seven per day, because of severe     chest pain.  He was admitted by Georga Hacking, M.D., on 03/18/02 for     what was essentially an elective cardiac catheterization to evaluate the     persistent complaints of chest pain.  Cardiac catheterization revealed     normal LV function and evidence of severe left main disease with     occlusion of LAD and also severe disease of the previous grafts.  Dr.     Donnie Aho recommended a CABG be performed, and I gather this is planned for     Wednesday.  Currently the patient is stable and does not complain of     chest pain or breathing difficulty.  2. Hypertension.  The patient has a history of well-controlled hypertension     and has been maintained on essentially the same medications for several     years.  Previously he has been intolerant of ACE INHIBITORS.  3. Diabetes.  The patient monitors his blood sugars quite closely.  They are     consistently in the range of 100-160.  He is very compliant with his     medications.  4. Sleep apnea.  The patient has been shown on several occasions to have     evidence of severe sleep apnea.  He is intolerant of nasal CPAP and     therefore does not take any medication for this problem.  5. Asthma.  The patient has a history of chronic coughing and  wheezing, for     which he uses Ventolin inhaler and Azmacort on a p.r.n. basis.  6. Hyperlipidemia.  7. Gastroesophageal reflux.  This is well-controlled on Prevacid 30 mg     daily.   MEDICATIONS:  The patient's current medications consist of Cardizem CD 240  mg daily, Norvasc 5 mg daily, Diovan HCT 160/12.5 mg one daily, atenolol 25  mg daily, Actos 30 mg daily, Glucotrol XL 5 mg b.i.d., Lantus insulin 60  units daily, Ventolin inhaler two puffs q.i.d., Azmacort inhaler two puffs  b.i.d., Lipitor 20 mg daily, Zaroxolyn 5 mg p.r.n. for leg edema, Lasix 80  mg b.i.d., Imdur 30 mg daily, nitroglycerin p.r.n.  for chest pain,  desipramine 50 mg daily, Trilafon 4 mg at 4 p.m., 8 mg at bedtime, Xanax 1  mg b.i.d. and 2 mg at bedtime, Tegretol 600 mg at bedtime, Plavix 75 mg at  bedtime, Neurontin possibly 300 mg b.i.d., and Vicodin p.r.n. for pain.  He  has a home oxygen, which I believe he uses on a regular basis.   PAST SURGICAL HISTORY:  He has had a previous history of colon polyps and  has had several colon polyps resected.  He has had a total of six lumbar  laminectomies.  He had a spinal fusion done in 5/99.  CABG x3 in 1993.  Six  knee operations between 1979 and 1982.  Appendectomy in 1980.  Hernia repair  in 1991.  Cholecystectomy in 1996.  TURP in 1997.  Mastoid surgery in 1972.  Cataract surgery in 1992.  Right foot surgery by Colon Flattery. Ollen Bowl, M.D., in  the past for a benign lesion.  A right trabeculoplasty in 1998.  Removal of  a benign tumor from his left foot in 2000.   FAMILY HISTORY:  The patient's father died at the age of 35 of heart  disease.  His mother died at the age of 66 of heart disease.  He has a  brother who has heart disease.  He has a daughter who died as a result of  HIV infection.   SOCIAL HISTORY:  He is on chronic disability because of his heart problems.  He denies use of drugs.  He denies the use of alcohol.  He discontinued  cigarette smoking in  1993.   REVIEW OF SYMPTOMS:  HEAD:  He denies headache or dizziness.  EYES:  He  denies visual blurring or diplopia.  EAR, NOSE, AND THROAT:  The patient had  an earache earlier this morning, which seems to have gotten better with ear  drops.  CHEST:  He denies coughing, wheezing, or chest congestion.  CARDIOVASCULAR:  He denies orthopnea, PND, or ankle edema, otherwise please  see above.  GASTROINTESTINAL:  He denies nausea, vomiting, abdominal pain,  change in bowel habits, melena, or hematochezia.  GENITOURINARY:  Denies  dysuria, urinary frequency, hesitancy, or nocturia.  RHEUMATOLOGIC:  Denies  back pain or joint pain.  HEMATOLOGIC:  Denies easy bleeding or bruising.  NEUROLOGIC:  Denies history of seizure or stroke.   PHYSICAL EXAMINATION:  GENERAL:  The patient is an obese gentleman who  currently is in no acute distress.  He is not currently experiencing any  chest pain or breathing difficulty.  HEENT:  Within normal limits.  CHEST:  Clear.  CARDIAC:  There is a normal S1 and S2.  There are no rubs, murmurs, or  gallops.  Bilateral carotid bruits are audible.  ABDOMEN:  Benign, normal bowel sounds, without masses, tenderness, or  organomegaly.  EXTREMITIES:  1+ pretibial edema on the left.  NEUROLOGIC:  Neurologic testing is within normal limits.   IMPRESSION:  1. Coronary artery disease, status post coronary artery bypass graft, with     evidence of recurrent severe left main disease.  2. Hypertension.  3. History of congestive heart failure.  4. Diabetes.  5. Sleep apnea.  6. Asthma.  7. Leg cramps.  8. Hyperlipidemia.  9. Gastroesophageal reflux.  10.      Chronic back pain.  11.      Chronic depression with psychotic features.  12.      History of hiatus hernia.  13.  History of peptic ulcer disease.  14.      History of chronic headaches.  15.      Chronic obstructive pulmonary disease.  16.      History of colon polyps, status post resection.  PLAN:   Will be happy to follow the patient along with you in the inpatient  setting.  I will review his medications to make sure he is currently  receiving the medicines that he was receiving previously.  We will assist  with monitoring his diabetes status as well.                                                Jerl Santos, M.D.    SWY/MEDQ  D:  03/19/2002  T:  03/22/2002  Job:  [id number]

## 2010-12-21 NOTE — Assessment & Plan Note (Signed)
[facility]   NAME:  Nicholas [name], Nicholas [name]              ACCOUNT NO.:  [id number]   MEDICAL RECORD NO.:  [id number]      DATE OF BIRTH:  01/31/1941   PHYSICIAN:  Jake Shark A. Tanda Rockers, M.D. VISIT DATE:  11/12/2006                                   OFFICE VISIT   SUBJECTIVE:  The patient is a 70 year old man who we follow for a  recalcitrant sacral ulcer.  We have treated him with multiple  debridements, Regranex, and various topical agents without complete  closure being achieved.  We have explained to him the utilization of  tissue therapy, i.e. Apligraf.  We have made both he and his wife aware  of the off-label uses of this treatment for wounds which are chronic and  are failing to respond to all other methods.  We have recommended that  we proceed with an Apligraf.  They have given informed verbal consent to  proceed.  In the interim, they deny increased drainage, malodor, pain,  or fever.   OBJECTIVE:  Blood pressure is 130/64, respirations are 18, pulse rate is  61, temperature is 97.9.  Inspection of the wound shows that there  appears to be clean granulation tissue.  The wound was irrigated and  sharply debrided of an exudative thin film.  There was easy bleeding.  Thereafter, the Apligraf was fashioned and hand-meshed and laid in the  position, and secured with Steri-Strips and a nonadherent dressing.  We  placed a bolus of nonadherent synthetic gauze with cotton gauze to act  as an absorbent.  This was held into position with benzoin and Steri-  Strips.   We explained to the wife that she should leave this intact for one week.  We expect some drainage and mild malodor, but there should be no fever  and no extensive tenderness or redness.  Both the patient and his wife  seemed to understand.  We are discharging him from the clinic with  arrangements for follow up in one week.  They will come sooner if they  feel that the drainage is excessive or there is evidence  of any  infection.      Harold A. Tanda Rockers, M.D.  Electronically Signed     HAN/MEDQ  D:  11/12/2006  T:  11/12/2006  Job:  [id number]

## 2010-12-21 NOTE — Consult Note (Signed)
[facility]  Patient:    Nicholas [name], Nicholas [name] Visit Number: [id number] MRN: [id number]          Service Type: PMG Location: [facility] Attending Physician:  Sondra Come Dictated by:   Sondra Come, D.O. Proc. Date: 05/07/01 Admit Date:  04/09/2001                            Consultation Report  FOLLOWUP EVALUATION:  The patient returns to clinic today for a second in a series of lumbar epidural steroid injections.  Patient had his first lumbar epidural steroid injection in this series on April 30, 2001.  He states that he has had mild-to-moderate decrease in pain in his low back and states that he is able to walk longer distances.  Currently, his pain is a 4/10 on a subjective scale.  He denies any new neurologic complaints.  He continues to be off of his Plavix until after his epidural steroid injections are completed.  He continues to take hydrocodone primarily for his lower extremity pain, which he describes as numbness and tingly type pain.  He also takes Neurontin 300 mg t.i.d.  His function and quality of life indices remain declined.  His sleep remains poor.  Health and history form and 14-point review of systems are reviewed.  There are no changes since last visit.  PHYSICAL EXAMINATION:  GENERAL:  This is a healthy-appearing male in no acute distress.  VITAL SIGNS:  Blood pressure is 145/55, pulse 83, respirations 16.  NEUROLOGIC:  There are no neurologic deficits in the lower extremities including motor, sensory and reflexes.  IMPRESSION: 1. Degenerative disk disease of the lumbar spine. 2. Spinal stenosis with neurogenic claudication. 3. Status post lumbar surgery x 6.  PLAN: 1. Lumbar epidural steroid injection.  Procedure was described to patient.    Informed consent was obtained.  Patient was brought back to the fluoroscopy    suite and placed on the table in prone position.  Skin was prepped in usual    sterile fashion.  Skin and  subcutaneous tissue were anesthetized with 3 cc    of 1% lidocaine.  Under direct fluoroscopic guidance, an 18-gauge    3-1/2-inch Tuohy needle was advanced towards the left paramedian L5-S1    epidural space, however, this was changed to the right paramedian L5-S1    epidural space secondary to difficulty entering the epidural space on the    left secondary to bony contact.  In addition, the needle was changed to a    20-gauge 6-inch needle secondary to body habitus.  Under direct    fluoroscopic guidance, a 20-gauge 6-inch Tuohy needle was advanced into the    right L5-S1 epidural space with loss-of-resistance technique.  No CSF, heme    or paresthesia were noted.  This was injected with 1 cc of    preservative-free Depo-Medrol 40 mg/cc plus 1 cc of normal saline without    complication.  Patient tolerated the procedure well.  Discharge    instructions given. 2. Patient instructed to increase Neurontin to 600 mg at bedtime in addition    to his morning and afternoon doses of 300 mg. 3. Return to clinic in one week for reevaluation and possible third lumbar    epidural steroid injection.  Patient was educated in the above findings and recommendations and understands.  There were no barriers to communication. Dictated by:   Sondra Come, D.O. Attending  Physician:  Sondra Come DD:  05/07/01 TD:  05/07/01 Job: [id number]

## 2010-12-21 NOTE — Consult Note (Signed)
NAME:  Nicholas [name], Nicholas [name] NO.:  [id number]   MEDICAL RECORD NO.:  [id number]                   PATIENT TYPE:  INP   LOCATION:  2901                                 FACILITY:  [facility]   PHYSICIAN:  Barbaraann Share, M.D. [facility]           DATE OF BIRTH:  05/24/1941   DATE OF CONSULTATION:  03/19/2002  DATE OF DISCHARGE:                          PULMONARY MEDICINE CONSULTATION   HISTORY OF PRESENT ILLNESS:  The patient is a very pleasant 70 year old  American-Indian male who I have asked to see for preop pulmonary evaluation.  The patient is to have redo coronary artery bypass graft next week.  He  carries a diagnosis of emphysema and severe asthma, but the patient  states that almost all of his pulmonary symptoms have resolved since smoking  cessation in 1993.  He was a one pack per day smoker all of his life prior  to that.  The patient has excellent exercise tolerance in quick bursts with  very little breathing difficulties.  He states that he is primarily limited  by his leg pain and fatigue.  He has no cough or mucous production.  He has  no pulmonary symptoms earlier in his life that are suggestive of asthma.   PAST MEDICAL HISTORY:  1. Significant for severe depression.  2. History of hypertension.  3. History of dyslipidemia.  4. History of diabetes.  5. History of peptic ulcer disease.  6. History of reflux esophagitis.  7. History of severe lumbar osteoarthritis.  8. History of coronary artery disease, status post coronary artery bypass     graft.  9. Status post appendectomy.  10.      Status post right mastoid surgery.  11.      Status post cholecystectomy.  12.      History of sleep apnea with poor tolerance of CPAP.   ALLERGIES:  No known drug allergies.   MEDICATIONS:  He is managed on Albuterol and Azmacort as an outpatient.   SOCIAL HISTORY:  The patient formerly worked in [employer] for the  city of [place], but currently  is on disability.  He has a history of  smoking one pack per day all of his life, but has not smoked since 1993.   FAMILY HISTORY:  Very positive for heart disease.   REVIEW OF SYSTEMS:  As per history of present illness, also see nursing  notes on patient's chart.   PHYSICAL EXAMINATION:  GENERAL:  He is a well-developed, American-Indian  male in no acute distress.  VITAL SIGNS:  Stable, he is afebrile.  His respiratory rate is approximately  16.  His heart rate is 80 and regular.  HEENT:  Pupils are equal, round, reactive to light and accommodation.  Extraocular movements are intact.  Nares are patent without discharge.  Oropharynx shows a very large tongue with elongated soft palate and uvula  and small air space posteriorly.  NECK:  Quite large and difficult to assess for jugular venous distention.  There is no palpable lymphadenopathy or thyromegaly.  CHEST:  Totally clear throughout.  CARDIAC:  Reveals regular rate and rhythm.  No murmurs, rubs or gallops.  ABDOMEN:  Soft and nontender with good bowel sounds.  GENITAL EXAM, RECTAL EXAM AND BREAST EXAM:  Not done and not indicated.  LOWER EXTREMITIES:  Show 1+ edema bilaterally with varicosities.  Pulses are  intact, but diminished symmetrically.  NEUROLOGIC:  He is alert and oriented times 3.  No evidence of mental  deficits.   Chest x-ray shows some hyperinflation with chronic changes, but no acute  process.   LABORATORY DATA:  Pulmonary function studies showed no obstruction by a PP  of 1%, but the flow volume does suggest subtle obstruction.  His FVC is  decreased probably due to morbid obesity. There is no significant response  to bronchodilators.   IMPRESSION:  1. Minimal to no obstructive airways disease.  I suspect he has had     asthmatic bronchitis in the past secondary to smoking and now he is     symptom free since quitting smoking.  I think from a pulmonary     prospective his risks for postop pulmonary  complications is very low.     However, I think that his biggest risk for complications will be his     morbid obesity.   SUGGESTS:  1. Nebs in the postoperative period per pulmonary toilet only.  2. Early mobilization and incentive spirometry.  3. I will check on the patient postoperatively to make sure that he is doing     well.                                               Barbaraann Share, M.D. [facility]    [facility]/MEDQ  D:  03/19/2002  T:  03/23/2002  Job:  [id number]   cc:   Darden Palmer., M.D.   Mikey Bussing, M.D.

## 2010-12-21 NOTE — Discharge Summary (Signed)
[facility]  Patient:    Nicholas [name], Nicholas [name]                     MRN: [id number] Adm. Date:  [id number] Disc. Date: [id number] Attending:  Pearla Dubonnet                           Discharge Summary  CONDITION ON DISCHARGE:  Good.  DISCHARGE DIAGNOSES:  1. Right middle lobe and right lower lobe pneumonia with exudative effusion.  2. Severe pleuritic chest pain secondary to #1, resolved.  3. Placement of right chest tube with adequate drainage of exudative pleural     effusion on September 02, 1999, with subsequent pneumothorax that resolved.  4. Nocturnal hypoxia secondary to sleep apnea, treated with nasal cannula     oxygen with subsequent resolution of nocturnal hypoxia.  5. Type 2 diabetes mellitus.  6. History of major depression with psychotic features.  7. History of panic disorder.  8. Coronary artery disease, followed by Dr. Viann Fish.  9. History of congestive heart failure. 10. Chronic low back pain with lumbar laminectomy. 11. Hypertension, chronic. 12. Status post cerebrovascular accident, on aspirin therapy since 1995, with     minimal residual deficit. 13. Peptic ulcer disease, diagnosed by Dr. Leary Roca in 1996. 14. History of hiatal hernia with gastroesophageal reflux disease. 15. Recurrent sinusitis, active while during this hospitalization. 16. Left lower lobe pneumonia in December 1999. 17. Hypercholesterolemia. 18. Peripheral neuropathy. 19. Erectile dysfunction. 20. 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 21. 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 22. Chronic obstructive pulmonary disease. 23. Multiple environmental allergies.  CONSULTATIONS:  1. Cardiology consultation per Dr. Viann Fish, August 28, 1999.  2. CVTS, Dr. Ramon Dredge B. Tyrone Sage, September 02, 1999.  3. Pulmonary critical care, Dr. Billy Fischer, September 04, 1999.  4. Psychiatry, Dr. Dub Mikes, September 07, 1999.  PROCEDURES:  1. Right chest  tube placement, September 02, 1999, Dr. Sheliah Plane.  2. Bronchoscopy, September 05, 1999, Dr. Billy Fischer.  3. 2-D echocardiogram, August 27, 1999, revealing grossly normal LV function     but decreased LV compliance.  4. CT scan of the chest with contrast, revealing no evidence of pulmonary     embolus but did reveal bilateral lower lobe infiltrates, right side     greater than left, with a right pleural effusion.  Small reactive hilar     lymphadenopathy was noted as well as cardiomegaly.  5. Pulmonary ventilation and perfusion scanning, August 31, 1999, not     conclusive for pulmonary embolus.  6. Ultrasound-guided right thoracentesis, September 01, 1999, found to be     exudative.  7. CT scan of the paranasal sinuses without contrast, complete opacification     of the left maxillary sinus noted.  DISCHARGE MEDICATIONS:  1. Lasix 40 mg a day.  2. Imdur 30 mg a day.  3. Desipramine 50 mg at bedtime.  4. Perphenazine 4 mg at 4 p.m. and 8 mg at bedtime.  5. Xanax 1 mg two times a day and 2 mg at bedtime.  6. Tegretol 600 mg orally at bedtime.  7. Lipitor 20 mg daily.  8. Glucotrol XL 5 mg daily.  9. Plavix 75 mg daily. 10. Claritin 10 mg daily. 11. Cardizem CD 240 mg daily. 12. Usual ophthalmologic medications prior to hospitalization. 13. Augmentin 875 mg orally two times a day for one  week. 14. Atenolol 25 mg b.i.d. 15. Lotensin 20 mg daily. 16. Neurontin 400 mg three times a day. 17. Ventolin or Combivent inhaler 2 puffs inhaled four times a day. 18. Protonix 40 mg or Prevacid 30 mg 1 daily. 19. Aspirin 81 mg daily. 20. Nasal cannula O2 1 L nocturnally.  HOSPITAL COURSE: #1 - CONGESTIVE HEART FAILURE:  The patient presented to the [facility] Emergency Room on August 27, 1999, with acute onset of shortness of breath with good response to intravenous diuretics in the emergency room.  The patient had temporary CPAP applied in the emergency room, and was saturating well  on 2 L of oxygenation when initially evaluated for admission.  He did have severe pleuritic chest pain on admission.  The congestive heart failure resolved clinically, actually, while being treated in the emergency room prior to admission to the floor and was not a factor in his hospitalization thereafter.  #2 - TRILOBAR PNEUMONIA:  The patient actually was found to have infiltrates in his right middle lobe, right lower lobe, and left lower lobe on CT scanning and on chest x-ray.  The patient was initially treated with intravenous Levaquin and then switched to IV Zosyn and IV Zithromax by the fifth day of hospitalization.  The patient continued to spike fevers and was found to have a large right pleural effusion, and Dr. Sheliah Plane was consulted.  After thoracentesis by radiology, it revealed an exudative effusion by chemistries. All cultures of the pleural fluid on the right revealed no growth, and blood cultures during the hospitalization were negative.  The patient was experiencing severe right-sided pleuritic chest pain which improved a great deal with intravenous Toradol and subsequent drainage of the right pleural effusion with chest tubes placed by Dr. Sheliah Plane.  The patient experienced persistent collapse of the right lower lobe without expansion with chest CT, and Dr. Billy Fischer was consulted for possible bronchoscopy.  He carried this out on September 05, 1999, and found that the right lower lobe was collapsed not secondary to an endobronchial lesion but simply from the surround pleural effusion.  A small pneumothorax complicated the chest tube placement, but this had resolved by discharge.  Persistent fevers, which were occurring in the first five to six days of admission, resolved with evacuation of the exudative pleural effusion. Dr. Sung Amabile was quite helpful with eliminating several pulmonary medications such as Flovent, Humibid, and IV azithromycin.  The sputum  cultures and bronchial washings, as well as cultures from the pleural fluid, never grew an organism.  He continued to improve with intravenous antibiotic therapy.   By the time of discharge, the right lower lobe air space was greatly improved, and there did remain some bibasilar atelectasis.  The right-sided pleural effusion was much improved.  He was to continue outpatient Augmentin therapy. Pleuritic chest pain had resolved as well.  #3 - DIABETES MELLITUS:  Diabetes was well controlled during the hospitalization on Glucotrol.  #4 - MAJOR DEPRESSION WITH PSYCHOTIC FEATURES:  Relatively well controlled during the hospitalization.  The patient was having some distress at night with "bad dreams."  These improved as his pulmonary process improved.  #5 - MAXILLARY SINUSITIS:  The patient did complain of postnasal drip during the first few days of admission and a foul taste with the postnasal drip.  He was found to have a maxillary sinusitis and was started on Afrin nasal spray, and Claritin 10 mg a day was continued.  By discharge, he was having no postnasal  drip, and his sinus congestion had resolved.  #6 - ORAL THRUSH:  Resolved with Diflucan therapy.  #7 - HISTORY OF SLEEP APNEA:  The patient was found to have oxygen desaturations at night.  The room air O2 saturations were normal during the last several days of his admission.  He did frequently desaturate down to the mid to low 80%-range without nasal cannula O2; however, with 1 L of nasal cannula O2 at night his O2 saturations remained above 905.  He is found to be intolerant of using nasal CPAP at home, but nasal cannula O2 was arranged for home therapy.  LABORATORY DATA:  Cytology includes the following:  Pleural fluid with no malignant cells identified on cytologic examination.  There were no organisms or cultures from the pleural fluid.  Pleural fluid revealed a white blood cell count of 3200 with 82% neutrophils.  Protein  was 4.7 mg/dl, and HDL was 1610. The pH was 7.0.  All these numbers were consistent with an exudative effusion. Blood cultures x 2 from August 27, 1999, were negative, and revealed negative growth at five days.  Urinalysis was within normal limits on August 27, 1999. Urinary Legionella antigen was negative from August 31, 1999.  The patient did have bronchial washings performed, and cytology was negative. There were abundant white cells noted.  The culture revealed normal oropharyngeal flora.  The patient did receive serial CPKs drawn on August 27, 1999, revealing an initial CK of 99, and subsequent of 39 and 41, with negative CK-MBs and negative troponin I at less than 0.03.  TSH was 0.337, which was borderline low, on August 27, 1999.  Discharge laboratories included sodium of 139, potassium 4.3, chloride 103, bicarbonate 29, glucose 116, BUN 11, creatinine 0.8, calcium 8.9, total protein 6.7, albumin 2.5, AST of 22, ALT of 30, alkaline phosphatase of 95, and total bilirubin 0.3.  Pro time on August 27, 1999, was 12.7, INR 1.0, PTT 28 seconds.  CBC on admission revealed a white count of 16,000, hemoglobin of 14.3, and platelet count of 341,000.  CBC on discharge, September 13, 1999, revealed white count 8300, hemoglobin 12.1, platelet count of 604,000.  The patient was to be followed up at [facility] in one to two weeks on the above medications, and was to follow up with Dr. Tyrone Sage on September 20, 1999, with a follow-up chest x-ray.  The patient was discharged in good condition on September 13, 1999.  Of note, Dr. Donnie Aho was consulted to address the issue of possible ischemic heart disease causing congestive heart failure on admission.  This is more likely secondary to hypoxia and respiratory failure secondary to pneumonia. He had no further problems with congestive heart failure during his hospitalization.  Dr. Dub Mikes also consulted because of disturbing  hallucinations and dreams at night, but these improved as his physical condition improved.  No changes were made in his psychotropic medications. DD:  10/30/99 TD:  10/31/99 Job: [id number]

## 2010-12-21 NOTE — Consult Note (Signed)
NAME:  Nicholas [name], Nicholas [name] NO.:  [id number]   MEDICAL RECORD NO.:  [id number]          PATIENT TYPE:  INP   LOCATION:  5743                         FACILITY:  [facility]   PHYSICIAN:  Etter Sjogren, M.D.     DATE OF BIRTH:  1940-11-20   DATE OF CONSULTATION:  08/14/2004  DATE OF DISCHARGE:                                   CONSULTATION   CHIEF COMPLAINT:  Open wound, sacrum.   HISTORY OF PRESENT ILLNESS:  A 70 year old man well known to me from  previous admission.  He had had knee surgery and developed an MRSA  infection.  He subsequently has had a very difficult medical course,  including development of a very large sacral pressure sore.  This gradually  stabilized and he was ultimately transferred to [facility] and then after a 2-  week stay there went home on a home vac.  He has done fairly well with the  home vac for the sacral pressure sore.   PAST MEDICAL HISTORY:  1.  Coronary artery disease.  2.  Hypertension.  3.  History of CVA.  4.  MRSA of the right knee.  5.  Psychotic depression.  6.  Gastroesophageal reflux.   PHYSICAL EXAMINATION:  Limited to the integument:  The sacral pressure sore  is clean.  It is one-third the size it was on previous admission.  It has  had an excellent response to the vac.  There is no evidence of any  infection.   RECOMMENDATIONS:  1.  Continue the vac and avoid pressure.  2.  He also needs nutritional support.      Markus.Osmond   DB/MEDQ  D:  08/14/2004  T:  08/14/2004  Job:  [id number]

## 2010-12-21 NOTE — Discharge Summary (Signed)
NAME:  Nicholas [name], MOELLER NO.:  [id number]   MEDICAL RECORD NO.:  [id number]                   PATIENT TYPE:  INP   LOCATION:  3036                                 FACILITY:  [facility]   PHYSICIAN:  Melvyn Novas, M.D.               DATE OF BIRTH:  30-Nov-1940   DATE OF ADMISSION:  04/02/2002  DATE OF DISCHARGE:  04/09/2002                                 DISCHARGE SUMMARY   HISTORY OF PRESENT ILLNESS AND HOSPITAL COURSE:  The patient was admitted to  the neurology service within hours after being discharged from the  cardiovascular thoracic surgery service on April 02, 2002.  The patient is  a known 70 year old right-handed Native-American gentleman who was  discharged 9 days post open heart surgery and had undergone a re-CABG.  The  patient had been hospitalized initially by Dr. Viann Fish, cardiologist  who follows him as outpatient and then taken to surgery by Dr. Tyrone Sage.  He  was evaluated on April 01, 2002 by Dr. Kelli Hope who evaluated the  patient for periodic right arm numbness and tingling dysesthesias and  suspected TIAs.  He recommended to start the patient on Plavix.  The patient  was discharged the following day then he was brought back to the emergency  room after having multiple spells of 10 to 15 minutes duration with right-  hand numbness and some weakness as he felt his heart was heavier and felt  clumsy.  In the ER a head CT was ordered without contrast which showed that  the patient had a subdural.  Dr. Newell Coral and Dr. Tyrone Sage evaluated the  patient and the patient was admitted to neurology for the management of  possible seizures provoked by the subdural bleed which had also a small  subarachnoid component.   Past medical history: CVA in the MCA distribution which leaves the patient  with mild left hemiparesis, CABG x2, diabetes mellitus for over 3 years,  neuropathy, anxiety, depression, dyslipidemia, GERD, bipolar  disease,  glaucoma, sleep apnea obstructive.   The patient had undergone an MRI on April 06, 2002 which verified a  subdural bleed and also showed a subarachnoid component.  In addition, he  was seen by Dr. 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  There were no complications seen after angiography.  The patient  was placed on no anticoagulation or antiplatelet therapy due to the subdural  bleeds.  The patient was further on sliding scale heparin.  An EKG was  obtained which showed QT prolongation since previous EKG.  Followup CT  showed no change within the subdural bleed extension.  MRA showed appearance  of proximal aspect of the diagonal artery not typical.  It is likely that  this represents a remote dissection with flap or dissection pseudoaneurysm,  mild stenotic changes  throughout. A STAT angiography felt left middle and  left anterior cerebral arteries seemed normal with flow posteroparietal  region, normal flow cortical branches, had slow flow without filling  defects, distal cervical segment of the left ICA is normal, petro_______ was  normal.  Dr. Corliss Skains concluded that there was diffuse atherosclerotic  disease in the carotid bulbs bilaterally, which are not dynamically  significant.  Moderate focal stenosis of the right ICA in the cavernous  segment, focal stenosis to 75% in the proximal cavernous segment of the left  ICA.   Laboratories on April 03, 2002 - the patient had dropped his HGB to  8.6/hematocrit to 26.1 from previous 9.5 and 29.5 at admission, it was a  slow decline and attributed to a dilutional effect as the patient was  receiving IV fluids.  Sed rate was 75 and high.  Potassium went borderline  low at the day of admission at 2.9, glucose high at 167, BUN high at 25, the  patient's calcium was low at 8.2, albumin low at 2.5, ALT elevated at 54.  Phenytoin  levels at the day of admission had reached 9.6 and were considered  therapeutic enough to discharge the patient.  A urine sample showed no  infection.   Neurochecks documented no change throughout the hospital stay.  The patient  was discharged with followup appointments within 14 days at [facility]  Neurologic at Dr. Earl Gala office with a followup CT as well as his  cardiology appointment on April 09, 2002 on the following medications.  The patient is currently not on antiplatelets for the duration of another 4-  6 weeks pending reevaluation.   DISCHARGE MEDICATIONS:  1. Dilantin 200 mg in the morning, 200 at night p.o.  2. Flovent 44 mg inhaler two puffs per day.  3. Lipitor 20 mg q.d.  4. Phenazine 16 mg q.d.  5. Prevacid 30 mg once a day.  6. Claritin 10 mg q.d.  7. Colchicine 0.6 mg one b.i.d.  8. Niferex 50 mg q.d.  9. Albuterol two puffs q.i.d.  10.      Neurontin 300 mg b.i.d. p.o.  11.      Xanax 2 mg at bedtime.  12.      Desipramine 50 mg q.d.  13.      Lasix 40 mg once q.d.  14.      K-Dur 20 mEq q.d.  15.      Lopressor 50 mg one half tablet b.i.d.  16.      Colace p.r.n.  17.      We will hold Plavix and baby aspirin for another 4-6 weeks.  18.      Insulin 10 units Lente Insulin.   DISCHARGE INSTRUCTIONS:  The patient was not allowed to drive and received  an 0981 kcal diabetic diet prescription.  Nutritional consult was obtained  and the patient was taught in the presence of his wife who works for the  food service here at [facility] about a diabetic diet since she is  also affected by the condition.  CT evaluation was arranged for 14 days post  discharge on April 09, 2002.   CONDITION ON DISCHARGE:  The patient was discharged in stabilized condition.  Melvyn Novas, M.D.    CD/MEDQ  D:  05/15/2002  T:  05/15/2002  Job:  [id number]

## 2010-12-21 NOTE — Assessment & Plan Note (Signed)
[facility]   NAME:  Nicholas [name], Nicholas [name]              ACCOUNT NO.:  [id number]   MEDICAL RECORD NO.:  [id number]           DATE OF BIRTH:   PHYSICIAN:  Theresia Majors. Tanda Rockers, M.D.      VISIT DATE:                                     OFFICE VISIT   Nicholas [name] returns for followup of the sacral ulcer.  During the interim,  the wound continues to have scant drainage.  It is not painful and it is not  associated with fever or malodor.   OBJECTIVE:  His capillary blood glucose is 124 mg percent, pulse rate is 88  and regular, respirations are 16.  He is afebrile.  Examination of the  sacral ulcer shows that there is an intact sinus with a diameter of  approximately 1 cm and extending down to 1.5 cm.  There is serosanguineous  drainage.  There is some hint of purulence.  This material has been  cultured.   ASSESSMENT:  Sacral ulcer with questionable sign as to an active source of  osteomyelitis.   PLAN:  We will proceed with an MRI of the sacral area to rule out the  concurrence of osteomyelitis.  We will see the patient in 1-2 weeks  following completion of his MRI.  In the interim, he is to continue with the  antiseptic soak cleansing and a dry cotton gauze dressing.           ______________________________  Theresia Majors. Tanda Rockers, M.D.     Nicholas [name]  D:  12/27/2005  T:  12/28/2005  Job:  [id number]

## 2010-12-21 NOTE — Consult Note (Signed)
[facility]  Patient:    Nicholas [name], Nicholas [name] Visit Number: [id number] MRN: [id number]          Service Type: FTC Location: FOOT Attending Physician:  Sharren Bridge Dictated by:   Sondra Come, D.O. Proc. Date: 05/27/01 Admit Date:  05/20/2001                            Consultation Report  REASON FOR EVALUATION:  Mr. Brigance returns to clinic today, two weeks post third lumbar epidural steroid injection.  Patient states he had some relief for the first week following the injection but his pain in his low back and neurogenic claudication are returning back towards baseline.  Overall, his function and quality of life have improved modestly and his sleep is great now.  He continues to take Neurontin 300 mg in the morning and in the afternoon and 600 mg at bedtime.  He takes hydrocodone 5 mg with 500 mg of acetaminophen approximately five times per day with some mild relief of his pain.  His current pain level is a 6/10 on a subjective scale and described as constant, stabbing, with numbness in his lower extremities.  Patients pain is worse with standing and relieved with sitting.  He is not interested in taking more medications or even switching pain medications.  He states that he has taken Duragesic in the past but cannot remember the dose.  Nevertheless, it was ineffective for him.  He states he still has some at home and I asked him to check and call the nurse with the name of the medication and dose.  Patient denies any new neurologic symptoms.  I review health and history form and 14-point review of systems.  Medications are listed there.  PHYSICAL EXAMINATION:  GENERAL:  Physical examination reveals a healthy male in no acute distress.  VITAL SIGNS:  Blood pressure is 127/43, pulse 72, respirations 16 and regular, O2 saturation 93% on room air.  NEUROMUSCULAR:  Gait is normal with slight wide base.  No significant imbalance noticed.   Examination of the back reveals decreased lumbar lordosis with a vertical midline scar.  There is minimal tenderness to palpation in bilateral lumbar paraspinal muscles.  Range of motion is decreased in all planes with discomfort.  Manual muscle testing is 5/5, bilateral lower extremities.  Sensory exam reveals decreased light touch in a stocking distribution bilaterally.  Muscle stretch reflexes are 1+/4, bilateral patellar, 0/4, bilateral medial hamstrings and Achilles.   IMPRESSION: 1. 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 2. 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 3. Facet arthropathy.  A component of patients pain may be secondary to    posterior element, although his symptoms are very consistent with mainly    spinal stenosis with claudication. 4. Status post spinal surgery x 6. 5. Polypharmacy.  PLAN: 1. Continue current medications as prescribed. 2. Patient to call with name and dose of previous pain medication.  If this is    not Duragesic, will consider switching over to this long-acting form of    opiate analgesia. 3. Patient to return to clinic in one month for reevaluation.  Patient was educated in the above findings and recommendations and understands.  There were no barriers to communication. Dictated by:   Sondra Come, D.O. Attending Physician:  Sharren Bridge DD:  05/27/01 TD:  05/28/01 Job: [id number]

## 2010-12-21 NOTE — Assessment & Plan Note (Signed)
[facility]   NAME:  Nicholas [name], Nicholas [name]              ACCOUNT NO.:  [id number]   MEDICAL RECORD NO.:  [id number]      DATE OF BIRTH:  [birth year]   PHYSICIAN:  Jake Shark A. Tanda Rockers, M.D. VISIT DATE:  02/14/2006                                     OFFICE VISIT   SUBJECTIVE:  Nicholas [name] returns for follow up of a difficult sacral  decubitus. Last week he underwent a debridement of a wound that appeared to  be retrogressed. In the interim he has continued to use the Regranex  protocol being applied by his wife. They report that the drainage is less  and the wound appears to be improving.   OBJECTIVE:  VITAL SIGNS:  Are stable, he is afebrile.  INSPECTION OF THE SACRAL ULCER:  Shows that it is in fact contractant. There  is a base of healthy-appearing granulation. The Q-Tip does not drop into any  sinus. The wound was photographed and entered into the wound expert.   ASSESSMENT:  Improved.   PLAN:  Will continue the Regranex protocol. We will reevaluate the wound in  1 month.  They will apply the Regranex b.i.d. per protocol.           ______________________________  Theresia Majors. Tanda Rockers, M.D.     [name]  D:  02/14/2006  T:  02/14/2006  Job:  [id number]

## 2010-12-21 NOTE — Assessment & Plan Note (Signed)
[facility]   NAME:  Nicholas [name], Nicholas [name]              ACCOUNT NO.:  [id number]   MEDICAL RECORD NO.:  [id number]      DATE OF BIRTH:  October 09, 1940   PHYSICIAN:  Jake Shark A. Tanda Rockers, M.D. VISIT DATE:  04/10/2006                                     OFFICE VISIT   SUBJECTIVE:  Nicholas [name] returns for follow up of a very difficult sacral  wound.  In the interim, he has been using Regranex but he ran out of  Regranex approximately a week ago and has been using Neosporin.  He  complains of some pain in the area of the ulcer, but he denies drainage or  fever.   OBJECTIVE:  Blood pressure 124/72, respirations 18, pulse rate 64, and he is  afebrile.  Capillary blood glucose is 118 mg percent.  Inspection of the  wound shows that there is a 98% re-epithelization.  There is a small area of  granulation which has failed to cover.  This was probed with the cotton swab  and it does not extend down to bone.  There is a moderate amount of firm  flexibility consistent with the coccyx or new osteogenesis.  This is  markedly painful to the patient.  It is not associated with erythema or  drainage.   ASSESSMENT:  Stable wound.   PLAN:  We will reinitiate the Regranex therapy.  We have given him a  prescription for 15 grams to be used as directed.  We will re-evaluate him  in one month.  In the interim, we placed a moist/moist dressing.           ______________________________  Theresia [name]. Tanda Rockers, M.D.     Nicholas [name]  D:  04/10/2006  T:  04/10/2006  Job:  [id number]

## 2010-12-21 NOTE — Consult Note (Signed)
[facility]  Patient:    Nicholas [name], Nicholas [name] Visit Number: [id number] MRN: [id number]          Service Type: PMG Location: [facility] Attending Physician:  Sondra Come Dictated by:   Sondra Come, M.D. Proc. Date: 04/09/01 Admit Date:  04/09/2001   CC:         Nicholas [name], [facility],  [address], [place], [place]  [address]   Consultation Report  REFERRING Nicholas [name]:  Nicholas [name] from [facility], [address], [place], [place]  [address].  CHIEF COMPLAINT:  Low back pain.  HISTORY OF PRESENT ILLNESS:  Nicholas [name] is a pleasant 70 year old right-hand-dominant male who was kindly referred by Dr. Cheri [name] for evaluation and pain management.  Nicholas [name] is somewhat of a poor historian.  He has multilevel degenerative disk disease and is status post L4-5 decompression in May of 1998 by Dr. Colon [name]. Nicholas [name] in [place].  He also underwent L3-4 decompression in June of 1999 by Dr. Cheri [name] in [place].  Patient continues to have pain in his low back radiating to lower extremities, but mainly his left hip.  His current pain level is [name] 8/10 on a subjective scale and characterized as constant, dull, achy with associated weakness.  Patients symptoms are made worse by walking and bending and improved with rest and medications.  Patient states that he gets relief when going to the grocery store with his wife and he is able to push a grocery cart and lean forward over the cart.  With this technique, he is able to decrease his back and lower extremity pain.  He also states that after walking for a period of time, his pain is relieved with sitting after approximately five minutes.  Patient has complex medical history and is not deemed [name] appropriate surgical candidate for his low back.  Patient states that prior to his 1999 surgery, he had lumbar epidural steroid injections x 3 with fairly good  results, although transient.  Patient has been treated in the past with physical therapy without relief and he is inconsistently performing a home exercise program.  Patients quality of life and functional indices have declined and his sleep is rated as fair to good.  Patient denies bowel and bladder dysfunction.  Health and history form and 14-point review of systems were reviewed.  PAST MEDICAL HISTORY:  Congestive heart failure, myocardial infarction, COPD, CVA, diabetes, high blood pressure, memory loss, depression.  PAST SURGICAL HISTORY:  Lumbar surgeries as noted in 1998 and 1999, patient also admits to lumbar surgery in 1992, 1994 and 1996.  He is also status post coronary artery bypass graft.  He is status post foot surgery approximately four weeks ago.  FAMILY HISTORY:  Heart disease.  SOCIAL HISTORY:  Denies smoking or alcohol use.  He is currently married and not currently working since 1991.  Prior to that, he was working for the [employer].  ALLERGIES:  No known drug allergies.  MEDICATIONS:  Patient takes multiple medications but forgot to bring his list today and cannot remember all of them.  He currently takes insulin and Glucophage for his diabetes, he takes hydrocodone, approximately five per day, for his pain as well as Neurontin 300 mg three times a day with questionable relief.  Other medications are unknown at this time.  PHYSICAL EXAMINATION:  GENERAL:  Physical examination reveals [name] obese male who  is pleasant and in no acute distress.  Mood and affect are appropriate.  VITAL SIGNS:  Blood pressure is 161/75, pulse 75, respirations 16.  NEUROMUSCULAR:  Gait is mildly antalgic.  Examination of the patients spine reveals midline scars with no change in pain on flexion and extension.  Side bending causes contralateral pain.  Rotation without pain.  Manual muscle testing is 5/5, bilateral lower extremities.  Sensory exam reveals decreased light  touch in a stocking distribution bilaterally.  Muscle stretch reflexes are 1+/4, bilateral patellae, 0/4, bilateral medial hamstrings and Achilles. Straight leg raise is negative bilaterally.  FABER test is negative bilaterally.  Patient is noted to have significantly tight hamstrings and hip flexors bilaterally.  EXTREMITIES:  Examination of the lower extremities reveals mild bilateral foot and ankle edema with diminished pulses pedally.  Patient does not have any skin rashes or ulcers but a healing foot incision secondary to surgery.  IMPRESSION: 1. 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 2. Complex medical history with multiple medical problems.  PLAN: 1. Four-wheel multi-breaking walker with a seat.  This should allow patient to    ambulate at a community level without significant lower extremity and low    back pain, given his improved ambulatory status with a shopping cart. 2. Will consider lumbar epidural steroid injection, however, I am hesitant to    do this in light of not knowing all of patients current medications and    given his complex medical problems. 3. I will hold off on prescribing any further pain medications, not knowing    patients medication profile.  Patient to return to clinic in four weeks    for reevaluation.  He was instructed to bring either his medications or    medication list for review.  Patient was educated in the above findings and recommendations and understands.  There were no barriers to communication. Dictated by:   Sondra Come, M.D. Attending Physician:  Sondra Come DD:  04/09/01 TD:  04/10/01 Job: [id number]

## 2010-12-21 NOTE — Discharge Summary (Signed)
NAME:  Nicholas [name], Nicholas [name] NO.:  [id number]   MEDICAL RECORD NO.:  [id number]          PATIENT TYPE:  INP   LOCATION:  5016                         FACILITY:  [facility]   PHYSICIAN:  Candyce Churn, M.D.DATE OF BIRTH:  1941/04/01   DATE OF ADMISSION:  05/24/2004  DATE OF DISCHARGE:  06/25/2004                                 DISCHARGE SUMMARY   DISCHARGE DIAGNOSES:  1.  Stage IV bilateral buttock decubitus now being treated with debridement      and wound vac, steadily improving.  2.  Methicillin-resistant Staphylococcus aureus right knee septic arthritis      diagnosed May 10, 2004 with completion of vancomycin therapy on      June 06, 2004.  3.  Right knee gouty arthritis with flare during this hospitalization,      resolved.  4.  Hypertension.  5.  Type 2 diabetes mellitus.  6.  Anemia, multifactorial, stable.  7.  Urinary retention requiring suprapubic catheter placed on June 21, 2004 per Dr. Su Grand.  8.  Coronary artery disease with coronary artery bypassing x2.  9.  Obesity.  10. Chronic obstructive pulmonary disease with episodic asthmatic bronchitis      and history of pneumonia.  11. Renal insufficiency, resolved secondary to dehydration.  12. History of glaucoma.  13. History of depression/anxiety with psychotic features, stable.  14. History of cerebrovascular accident with minimal residual deficit.  15. Hiatal hernia with gastroesophageal reflux disease.  16. Obstructive sleep apnea, but does not tolerate CPAP.  17. Diabetic peripheral neuropathy and retinopathy.  18. History of diabetic gastroparesis.  19. History of gout.  20. Chronic lumbar degenerative joint disease with three back operations in      the past.  21. Chronic constipation.  22. Escherichia coli urinary tract infection, resolved.  23. Malnutrition, resolved.  24. Allergic rhinitis.   MEDICATIONS:  1.  Alamast eye drops 0.1% one drop in each eye b.i.d.  2.   Betoptic 0.25% one drop in each eye b.i.d.  3.  Xalatan 0.005% one drop in each eye at bedtime.  4.  Potassium chloride 20 mEq daily.  5.  Claritin 10 mg daily.  6.  Diltiazem extended release 240 mg daily.  7.  Lipitor 40 mg daily.  8.  Colace 100 mg b.i.d.  9.  Senokot S two tablets p.o. q.h.s.  10. Flomax 0.4 mg daily.  11. Atenolol 25 mg b.i.d.  12. Flovent 44 two puffs inhaled b.i.d.  13. Tegretol 600 mg p.o. q.h.s.  14. Xanax 1 mg t.i.d.  15. Reglan 5 mg t.i.d. with meals.  16. Multivitamin one daily.  17. Vicodin 5/500 one tablet p.o. q.i.d.  18. ProMod powder 5 g with meals t.i.d.  19. Sliding scale Novolin Regular insulin CBG 101-150 7 units, CBG 151-200 9      units, CBG 201-250 12 units, CBG 251-300 16 units, CBG 301-350 20 units      (check CBG before breakfast and before supper daily).  20. Allopurinol 100 mg p.o. b.i.d.  21. Avodart 0.5 mg daily.  22.  Imdur 60 mg daily.  23. Lantus insulin 32 units subcutaneous q.h.s.  24. Plavix 75 mg daily.  25. Clotrimazole Troches 10 mg dissolved orally daily while on Primaxin.  26. Primaxin 500 mg IV q.6h. for four weeks, then change to p.o. Augmentin      875 mg b.i.d. until the sacral decubitus has been healed for four weeks.  27. Nitroglycerin 0.4 mg sublingual p.r.n.  28. Restoril 30 mg p.o. q.h.s. p.r.n.  29. Tylenol 650 mg p.o. q.4h. p.r.n. mild pain.   CONSULTS:  1.  Plastic surgery, Dr. Pleas Patricia and Dr. Etter Sjogren May 17, 2004  2.  Urology, Dr. Su Grand June 21, 2004  3.  Infectious disease, Dr. Nile Dear June 07, 2004  4.  Orthopedics, Dr. Marcene Corning  5.  General surgery consultation, Dr. Jimmye Norman, III   PROCEDURES:  1.  Multiple debridements of large stage 4 decubitus ulcer per plastic      surgery and general surgery.  2.  PICC placement on multiple occasions with last placement on June 21, 2004.  3.  Suprapubic catheter placement per Dr. Su Grand June 21, 2004.  4.  Right knee injection and aspiration injection per Dr. Jerl Santos June 08, 2004.  5.  Placement of wound vac per plastic surgery on June 20, 2004 with      pulse lavage therapy to the decubitus ulcer with would vac changes.   HOSPITAL COURSE:  Nicholas [name] is a very pleasant 70 year old [place] who was admitted for progressive changes of a stage II decubitus  ulcer to stage IV decubitus ulcer involving his buttock and gluteal cleft on  May 24, 2004.  Patient had developed stage II decubitus ulcer during his  hospitalization from October 6 to May 20, 2004 when he was admitted for  methicillin-resistant Staph aureus, right knee septic arthritis.  He was  discharged home and the wound worsened at home and he was readmitted on  May 24, 2004.   At the time of admission on May 24, 2004 he had a severe stage IV  decubitus ulcer that tracked deeply into his gluteal tissues.   HOSPITAL COURSE:  #1 - SACRAL DECUBITUS:  Plastic surgery was reconsulted on  admission and wound debridement and dressing changes were started  immediately, also pulse lavage.  This proceeded relatively well until  patient became febrile again, spiking temperatures to 102-103.  Patient had  E. coli growing from the wound base and patient was treated with intravenous  ciprofloxacin for this as well as an E. coli UTI.  Because of persistent  fevers, infectious disease was consulted and then general surgery was  consulted.  Patient was started on intravenous Primaxin and extensive  debridement per Dr. Jimmye Norman was performed on June 08, 2004 and  patient became afebrile after this time.   Only significant cultures from blood, urine, and wound cultures that  revealed a single organism as a cause of infection was E. coli from the  wound initially and then from the urine and this was covered by IV Cipro  early in the patient's course.  Primaxin along with wound  debridement on June 08, 2004 seemed to make a  significant turn in the patient's turn for the better in terms of the  patient's improvement from his decubitus and he has been afebrile since.  Dr. Burnice Logan has recommended six weeks  of antibiotic therapy after  wound closure and we have elected to treat with IV Primaxin until the wound  has been closed for at least three to four weeks and then oral Augmentin to  follow that for a total of six to eight weeks after wound closure of  antibiotic therapy to help prevent and/or treat possible osteomyelitis in  the area of the sacrum.   He is currently receiving wound vac therapy with pulse lavage between sponge  changes with continued improvement in the sacral decubitus.  Patient is now  ambulating well and will need continued physical therapy.   #2 - RIGHT KNEE SEPTIC ARTHRITIS:  Patient's right knee was septic with MRSA  on admission.  Earlier admission in October 6 to May 20, 2004.  Patient  had the knee lavaged and then was started on IV vancomycin and he completed  four weeks of IV vancomycin during this hospitalization.  He had another  exacerbation of pain and swelling in the right knee hindering physical  therapy for the knee and on aspiration per Dr. Marcene Corning abundant  monosodium urate crystals were obtained.  Patient was treated with  intraarticular steroid injection as well as colchicine and ultimately  allopurinol was added with resolution of right knee symptoms.  He is now  able to walk well on the knee and is progressing in physical therapy.   #3 - URINARY RETENTION:  Patient had problems with urinary retention during  his hospitalization from October 6 to May 20, 2004 at [facility].  He has  continued home and continued during his hospitalization here.  He has been  given alpha blocker therapy in the form of Flomax as well as Avodart therapy  and all possible anticholinergic medicines that he was on were  discontinued.  He was seen in consultation by Dr. Su Grand who placed a suprapubic  catheter prior to discharge and recommended clamping of the catheter until  patient feels the sensation to urinate and if he cannot urinate on his own  then to release the bladder contents through the suprapubic catheter,  continue to train the bladder until he can urinate on his own.  It is  possible that increasing his doses of Avodart and Flomax may be helpful in  resolving this issue.  Dr. Brunilda Payor will follow him at [facility].   #4 - TYPE 2 DIABETES MELLITUS:  Blood sugars controlled well and current  Lantus therapy with sliding scale insulin as per medication list.   #5 - HYPERTENSION:  Controlled while hospitalized.   #6 - HISTORY OF CORONARY ARTERY DISEASE WITH CORONARY ARTERY BYPASS GRAFT  X2:  Aware.  No ischemic heart symptoms or clinical findings during this  hospitalization.   #7 - OBESITY:  Improved during this hospitalization secondary to illness.  #8 - MALNUTRITION:  Patient's prealbumin measured on June 13, 2004 was  13.4.  It is now, as of June 20, 2004 was in the normal range at 23.1.  Patient was treated with ProMod with meals to help facilitate improvement in  his nutrition.   #9 - HISTORY OF DEPRESSION AND ANXIETY WITH PSYCHOTIC FEATURES:  While  febrile and acutely ill early in the hospitalization he was hallucinating.  This resolved with resolution of fevers.  We actually were able to taper him  off perphenazine and desipramine and mood and psychiatric status in general  has been stable.  He continues on Xanax and Tegretol.  Mood needs to be  followed closely to make  sure that he is not becoming more depressed or  anxious.   #10 - RENAL INSUFFICIENCY:  When patient becomes dehydrated, especially on  ACE inhibitors or ACE receptor blockers his creatinine can rise very  quickly.  It should be checked carefully should this clinical scenario  occur.  May have an  element of renal artery stenosis.  This has not been  documented.   #11 - HISTORY OF GLAUCOMA.   #12 - HIATAL HERNIA WITH GASTROESOPHAGEAL REFLUX DISEASE:  Currently  asymptomatic.  Reglan may have been helpful for this.   #13 - ORAL THRUSH:  Resolved with clotrimazole troches.   #14 - HISTORY OF OBSTRUCTIVE SLEEP APNEA WITH STRESS AUTONOMOUS CPAP:  O2  saturations were normal during hospitalization at night.  May require nasal  cannula O2 at night to prevent desaturations in the future and O2  saturations should be monitored per pulse oximetry periodically to assure  that this is not occurring.   Patient's vital signs on discharge reveal blood pressure 144/63, pulse 89  and regular, respiratory rate 16 and nonlabored, temperature 98.7.  Alonso  has been afebrile for more than a week.  CBGs are consistently running in  the mid 100s.  Room air O2 saturations are 96% and has been ranging from 96-  100% over the previous week prior to discharge.   DISCHARGE LABORATORIES:  CBC on May 17, 2004:  White count 8500,  hemoglobin 10.4, platelet count 642,000, stable.  Pro time 14 seconds, PTT  32 seconds.  CMET June 17, 2004 reveals sodium 131, potassium 3.8,  chloride 108, bicarbonate 25, glucose 128, BUN 9, creatinine 0.7, total  bilirubin 0.3, direct bilirubin less than 0.1, alkaline phosphatase 106,  SGOT 37, SGPT slightly elevated 81, but down from 142 on June 14, 2004.  Total protein 5.9, albumin 1.9, but prealbumin on June 20, 2004 was 23.1  (within normal range).   Patient had fecal occult blood on June 13, 2004 that was positive.  Patient was on Lovenox and Plavix at the time and Lovenox was discontinued  and this resolved GI blood loss.   Patient had multiple cultures obtained during this hospitalization including  blood cultures, decubitus cultures, cultures from PICC catheter tips, and  urine cultures.  Only significant culture that was positive were a  urine culture for E. coli that was obtained on May 24, 2004 and decubitus  wound culture on May 24, 2004 also growing E. coli.  Otherwise, cultures  were considered to be mixed or from contaminant/skin colonization.   Patient will be discharged to [facility] either on November 21 or  June 26, 2004 for further care of his decubitus and should be on a Ken-  Air bed at all times until wound is completely healed.      [place]obe   RNG/MEDQ  D:  06/25/2004  T:  06/25/2004  Job:  [id number]   cc:   Consuello Bossier., M.D.  Fax: [phone]   Etter Sjogren, M.D.  [address]  [address]  [place]  [place] [address]  Fax: [phone]   Lindaann Slough, M.D.  [address]  [place]  [place] [address]  Fax: [phone]   Lubertha Basque. Jerl Santos, M.D.  [address]  [place]  [place] [address]  Fax: [phone]   Jimmye Norman III, M.D.  [phone] [address], [address]  [place]  [place] [address]  Fax: [phone]

## 2010-12-21 NOTE — H&P (Signed)
NAME:  Nicholas [name], Nicholas [name] NO.:  [id number]   MEDICAL RECORD NO.:  [id number]          PATIENT TYPE:  INP   LOCATION:  5016                         FACILITY:  [facility]   PHYSICIAN:  Nicholas [name], M.D.DATE OF BIRTH:  1941/05/12   DATE OF ADMISSION:  05/24/2004  DATE OF DISCHARGE:                                HISTORY & PHYSICAL   [name] is a very pleasant 70 year old [place] who has been  admitted for progressive changes in decubitus ulcer involving his buttock  and gluteal cleft.  He has had decreased appetite for several days prior to  admission and poor p.o. intake.  He has had no nausea or vomiting, but he  has had increased somnolence.  He has multiple medical issues as noted  below, and he has extensive polypharmacy and has for many years.   The patient had decreased level of consciousness on admission today. Blood  sugar was noted to be 60, and then subsequently 42, and then with IV  dextrose, it has come up to 90.  He is now awake, conversant, but still  lethargic.  Denies any focal pain.  He does have acute elevation in BUN and  creatinine which has happened to him before when he becomes dehydrated.  His  decubitus ulcer has progressed very rapidly over the last four days since he  has been at home.  He is being readmitted at this time for further treatment  and care of this problem.   PAST MEDICAL HISTORY:  1.  Methicillin-resistant Staphylococcus aureus right knee septic arthritis,      diagnosed May 10, 2004, with joint lavage by Dr. Hurshel [name].  Now      on intravenous vancomycin.  He is due for 13 more days of therapy.  This      following right knee arthroscopic surgery approximately one week prior      to May 10, 2004.  2.  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  3.  Urinary retention at last hospitalization but improved  with Flomax      therapy.  4.  Type 2 diabetes mellitus.  5.  Hypertension.  6.  Coronary artery disease with coronary artery bypass grafting x 2.  7.  Obesity.  8.  Chronic obstructive pulmonary disease with episodic asthmatic bronchitis      and history of pneumonia.  9.  Renal insufficiency, recurrent with dehydration.  Creatinine was 1.2 on      May 19, 2004, and now up to 3.4 with a BUN of 40.  10. History of glaucoma.  11. History of depression and anxiety with psychotic features.  12. History of cerebrovascular accident with minimal residual deficit.  13. Hiatal hernia with gastroesophageal reflux disease.  14. Obstructive sleep apnea but does not tolerate CPAP.  15. Diabetic peripheral neuropathy and retinopathy.  16. History of diabetic gastroparesis.  17. History of gout.  18. History of chronic lumbar degenerative changes with three back  operations in the past.  19. Chronic constipation.   MEDICATIONS:  1.  Alamast eye drops 0.1% 1 drop each eye b.i.d.  2.  Betatopic 0.25% 1 drop each eye b.i.d.  3.  Allegra 180 mg daily.  4.  Cartia XT 240 mg daily.  5.  Benazepril 40 mg a day but will be holding this until renal      insufficiency resolves.  6.  Zaroxolyn 5 mg, on hold.  7.  Actos 30 mg daily, will hold.  8.  Quinine sulfate 260 mg 1/2 tablet Monday, Wednesday, and Friday at night      for leg cramps.  9.  Lantus 30 units subcutaneously daily and will hold until insulin      requirements are increased with better p.o. intake.  10. Imdur 30 mg a day.  11. Atenolol 25 mg b.i.d.  12. Glucotrol XL 5 mg daily, will hold for now.  13. Plavix 75 mg daily.  14. Azmacort inhaler 3 puffs b.i.d.  15. Lipitor 20 mg a day.  Will hold until taking p.o. well.  16. Sublingual nitroglycerin 0.4 mg p.r.n. chest pain.  17. Vicodin 5/500 one tablet p.o. 5 times a day but will resume at 4 times      daily.  18. Colace 100 mg b.i.d.  19. Has been using Silvadene cream  applied b.i.d. to his decubitus and was      rinsing with Betadine and soapy water but will change as per wound care      consultation recommendations.  20. Senokot S 2 tablets but will hold for diarrhea.  21. Flomax 0.4 mg p.o. q.h.s.  22. Xalatan Opthalmic solution 0.005% 1 drop in each eye at night.  23. Neurontin 300 mg t.i.d.  24. Potassium 20 mEq daily.  25. Vancomycin 1500 mg infuse per PICC daily; 13 more days of therapy      required.  26. Xanax 1 mg p.o. t.i.d.  27. Iron capsule Ferretts 150 one daily.  28. Prevacid 30 mg daily for PPI.  29. Perphenazine 4 mg in the morning and 16 mg at night.  30. Disopromine 25 mg at bedtime.  31. Tegretol 600 mg at bedtime but reduced to 400 mg.   PAST SURGICAL HISTORY:  1.  Back operations x 3.  2.  Laser surgery to eyes for glaucoma and retinopathy.  3.  Cholecystectomy.  4.  Appendectomy.  5.  CABG x 2.  6.  Right knee arthroscopic surgery approximately two weeks ago per Dr.      Marcene [name].   ALLERGIES:  No known drug allergies.   FAMILY HISTORY:  Significant for coronary artery disease in six brothers.   SOCIAL HISTORY:  The patient is married and has one son.  The patient is  disabled.  He worked for the city [employer].  Stopped smoking 15  years ago.  Does not drink alcohol.  He is a [place].   REVIEW OF SYSTEMS:  Denies headache, neck pain, chest pain, or abdominal  pain.  Does not complain of pain where the sacral decubitus is.  He does  have some soreness on his right knee but much improved.  Feels very weak  overall.  Has had decreased urination over the past couple of days but no  diarrhea.  No obvious dysuria.   PHYSICAL EXAMINATION:  GENERAL:  Obese male who is somewhat lethargic but  conversive and oriented.  VITAL SIGNS:  Temperature 100.2 maximum, down to 97.7 by  the time I examined him.  Pulse is 99 and regular, respiratory rate 18 and unlabored.  Blood  pressure 105/60.  Glucose was 60 on  admission, then 42 per finger stick,  then 92 after IV D50.  O2 saturation 93% on room air.  HEENT:  Sclerae nonicteric.  His mouth is somewhat dry, and he does have  some oral thrush.  NECK:  Supple without obvious JVD.  CHEST:  Clear to auscultation.  CARDIAC:  Regular rhythm, no murmurs.  ABDOMEN:  Soft, nontender, nondistended.  Bowel sounds are normal.  GENITOURINARY:  Exam is normal except he does have a Foley in place with  cloudy, purulent urine.  SKIN:  Buttock reveals a very large, stage II decubitus ulcer over the  buttocks and gluteal cleft.  It is malodorous.  There seems to be some early  tracking in the gluteal cleft.  EXTREMITIES:  Without edema except he does have a joint effusion over the  right knee but no erythema.  He has good capillary refill distally.  He has  minimal pain with flexion and extension of the right knee.  No ulcerations  are noted.  NEUROLOGIC:  Oriented x 3.  Nonfocal exam, just quite lethargic.   LABORATORY DATA:  White count 16,100, hemoglobin 11.5, platelet count  604,000.  Sodium 133, potassium 3.4, chloride 97, bicarb 23, BUN 40,  creatinine 3.4, glucose 116.  BUN and creatinine were 16 and 1.2,  respectively, on May 19, 2004, just 5 days ago.  Vancomycin trough level  on October 16 was 10.6.  Normal is 5 to 15.  Albumin is 2.0.   IMPRESSION:  A 70 year old male with large buttock and gluteal cleft  decubitus and diabetes mellitus.  He has recent methicillin-resistant  Staphylococcus aureus septic arthritis of the right knee.  He is now  dehydrated and has acute renal insufficiency and likely has a urinary tract  infection as well.  Medications may be contributing to his decreased  appetite.  He could certainly have gastroparesis which he has had in the  past secondary to diabetes.  There is a question of whether or not he is  still on Dilantin therapy, and will check a Dilantin level.  If so, this  could be contributing to his  lethargy.   PLAN:  1.  Decubitus ulcer:  Will care for wound as per wound care consultation      recommendations and will get Dr. Stephens November to reconsult.  He did see      him in the hospital last week for the decubitus.  Will culture the wound      and start IV ciprofloxacin.  Will also put patient on Kenaire bed.  2.  Diabetes mellitus:  We will also hold other oral medications and will      add Lantus back and use that and sliding scale insulin for now.  3.  Renal insufficiency:  Hold Benazepril, Lasix, and Zaroxolyn and      rehydrate with IV fluids.  Will follow BMET on a daily basis.  4.  Urinary tract infection:  Send culture and urinalysis. Will start IV      Cipro.  5.  Oral thrush:  Will use Mycelex troches four times a day.  6.  Nutrition:  Will get calorie counts and probably start panda feedings,      especially as prealbumin is quite low.  Nutrition consult is greatly     appreciated thus far.  7.  Decreased appetite:  Will start Reglan 5 mg with meals.  He has had      gastroparesis in the past, and this could be contributing to his      decreased appetite currently.  8.  Pharmacy consult to simplify medications.  9.  Check Tegretol levels.  10. Continue PT and OT for right lower extremity and ambulation.      Robe   RNG/MEDQ  D:  05/25/2004  T:  05/25/2004  Job:  [id number]   cc:   Lubertha Basque. Jerl Santos, M.D.  [address]  [place]  [place] [address]  Fax: [phone]   Consuello Bossier., M.D.  Fax: [phone]   W. Ashley Royalty., M.D.  [address]  [place]  [place] [address]  Fax: [phone]

## 2010-12-21 NOTE — H&P (Signed)
NAME:  BARRE, AYDELOTT NO.:  [id number]   MEDICAL RECORD NO.:  [id number]          PATIENT TYPE:  INP   LOCATION:  5152                         FACILITY:  [facility]   PHYSICIAN:  Candyce Churn, M.D.DATE OF BIRTH:  04-21-1941   DATE OF ADMISSION:  04/23/2006  DATE OF DISCHARGE:  04/27/2006                              HISTORY & PHYSICAL   CHIEF COMPLAINT:  Fever and hypotension.   HISTORY OF PRESENT ILLNESS:  Mr. Nicholus Chandran. Kwasny is a pleasant 70-year-  old male with the onset of fever on the morning of admission to 103  degrees.  He had chills and excessive weakness.  He was brought to the  [facility] Room and found to have pyuria, leukocytosis.  He  had no pulmonary or abdominal symptoms.  He is admitted for probable  urosepsis and has had increasing edema in his legs as of late.  No pain  or drainage from his chronic sacral decubitus.   ALLERGIES:  No known allergies to drugs.   MEDICATIONS:  Medications are as follows:  1. Protonix 40 mg daily.  2. Plavix 75 mg daily.  3. Flomax 0.4 mg b.i.d.  4. Cartia XT 240 mg daily.  5. Allopurinol 300 mg daily.  6. Atenolol 50 mg b.i.d.  7. Imdur 30 mg daily.  8. Lipitor 10 mg daily.  9. Lasix 40 mg p.o. b.i.d.  10.Metolazone 5 mg once daily Monday, Wednesday, and Friday.  11.Potassium chloride 20 mEq t.i.d.  12.Depakote 250 mg at bedtime.  13.Perphenazine 8 mg - 2 p.o. q.h.s. or 16 mg p.o. q.h.s.  14.Restoril 30 mg p.o. q.h.s.  15.Alprazolam 1 mg p.o. in the morning, 2 mg at bedtime.  16.Xalatan eye drops 0.5% 1 drop in each eye daily.  17.Betoptic 0.25% 1 drop in each eye b.i.d.  18.Patanol 0.1% 1 drop every 12 hours.  19.IV ciprofloxacin 400 mg q.12 h.  20.IV Rocephin 1 gram q.12 h.   PAST MEDICAL HISTORY:  1. Chronic sacral deformity secondary to previous sacral decubitus.  2. History of right septic arthrosis of the knee secondary to      methicillin-resistant Staphylococcus aureus.  3.  Right lower lobe pneumonia July 2007.  4. Type 2 diabetes mellitus.  5. Hypertension.  6. Gastroesophageal reflux disease.  7. Gout.  8. Coronary artery disease.  9. Hypercholesterolemia.  10.Congestive heart failure.  11.Schizophrenia.  12.Depression.  13.Anxiety.  14.Glaucoma.  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  16.Benign prostatic hypertrophy..  17.History of cerebrovascular accident.  18.Chronic obstructive pulmonary disease.  19.Obesity hypoventilation syndrome.   PAST SURGICAL HISTORY:  1. CABG x2.  2. Right knee arthroscopy.  3. Lumbar spine surgery times three.  4. Appendectomy.  5. Cholecystectomy.  6. Laser surgery for glaucoma and diabetic retinopathy.   HABITS:  Smoked until 1990 and no alcohol use.   FAMILY HISTORY:  Significant for coronary artery disease.   REVIEW OF SYSTEMS:  The patient denies diarrhea, headache, chest pain,  abdominal pain but has been having some increase in leg edema as of  late.   PHYSICAL  EXAMINATION:  Lethargic, conversant, oriented male.  Blood pressure 140/70.  Initially it had dropped to 80/70 while in the  emergency room.  Pulse was initially 104, now in the 80's.  Respiratory  rate is 20 and unlabored.  He is alert.  O2 saturation on room air is  95%.  HEENT:  Atraumatic, normocephalic.  NECK:  Supple without JVD or thyromegaly.  CHEST:  Clear to auscultation.  CARDIAC:  Regular rhythm without murmur.  ABDOMEN:  Soft, nontender.  Bowel sounds are normal and nondistended, no  rebound.  EXTREMITIES:  2+ lower extremity edema. With pitting.  They are warm  distally, no cyanosis.  Capillary refill is normal distally.  Also, in  terms of his extremities, he has severe deformity of the right knee with  a very highly mobile joint.  There is no cyanosis or modeling of the  skin.  His buttocks reveals extensive deformities secondary to old healed  decubitus.  NEUROLOGIC:  Oriented times three,  nonfocal, moves all extremities well.  Head CT is not acute except he does have a remote lacunar infarct.   Chest x-ray reveals no active disease.   Blood cultures times two are pending.   Sodium 135, POTASSIUM LOW AT 2.8, chloride 99, bicarbonate 32, BUN 21,  creatinine 1.3, blood sugar 135.  White count elevated at 15,300 with  85% neutrophils, hemoglobin 15.6, and platelet count 205,000.   Urinalysis is cloudy with specific gravity of 1.020, leukocyte esterase  large, and white cells too numerous to count.  Valproic acid level is  17.6, and lactic acid level is 2.3 - minimally elevated.   ASSESSMENT:  A 70 year old male with multiple medical problems who  presents with fever, hypertension, pyuria, and bacteruria.  He has had  increase in lower extremity edema lately.  He may have early urosepsis.   PLAN:  1. Fever/question urosepsis - treated with IV ciprofloxacin and IV      Rocephin.  2. Lower extremity edema - no added salt diet, elevated legs, continue      Lasix.  3. Hypotension - possible early sepsis.  Add Dopamine if becomes      symptomatic with hypertension or have signs of hypoperfusion or      marked tachycardia occurs.  4. Diabetes mellitus - will use sliding-scale NovoLog insulin with      Lantus, basal insulin 20 units daily.  5. Hypokalemia - replace orally and intravenously and will use IV      normal saline at 50 mL an hour at 30 KCl per liter and follow up      basic metabolic panels daily.  6. KinAir bed for acute sacral decubitus and to help decrease possibly      further skin breakdown.  7. Will monitor in stepdown unit.      Candyce Churn, M.D.  Electronically Signed     RNG/MEDQ  D:  07/03/2006  T:  07/03/2006  Job:  [id number]

## 2011-01-21 ENCOUNTER — Ambulatory Visit (INDEPENDENT_AMBULATORY_CARE_PROVIDER_SITE_OTHER): Payer: Medicare Other | Admitting: Surgery

## 2011-01-21 ENCOUNTER — Encounter (INDEPENDENT_AMBULATORY_CARE_PROVIDER_SITE_OTHER): Payer: Medicare Other

## 2011-01-21 DIAGNOSIS — I739 Peripheral vascular disease, unspecified: Secondary | ICD-10-CM

## 2011-01-21 DIAGNOSIS — Z48812 Encounter for surgical aftercare following surgery on the circulatory system: Secondary | ICD-10-CM

## 2011-01-21 DIAGNOSIS — I70219 Atherosclerosis of native arteries of extremities with intermittent claudication, unspecified extremity: Secondary | ICD-10-CM

## 2011-01-21 NOTE — Assessment & Plan Note (Signed)
OFFICE VISIT  ABAS, LEICHT DOB:  1941/07/21                                       01/21/2011 [id number]#:[id number]  The patient comes back today for followup.  He had previously had a left foot ulcer and underwent atherectomy of left popliteal artery, angioplasty of left anterior tibial artery.  Surveillance ultrasound revealed recurrence of the stenosis within his anterior tibial artery. He underwent on 12/11/2010 atherectomy with angioplasty of the left anterior tibial artery.  He comes back today for followup.  He is doing well.  He is wearing compression stockings to help with his edema. Wounds are healed.  PHYSICAL EXAM:  Vital signs:  Heart rate is 57, blood pressure 87/60, O2 sats 95%.  General:  He is well-appearing, in no distress.  HEENT: Within normal limits.  Cardiovascular:  Regular rate and rhythm. Extremities:  Warm, well-perfused.  He does have significant edema in his legs, there is minimal ulceration.  ABIs were performed today 0.67 on the right and 0.57 on the left with monophasic waveforms.  These are slightly decreased from prior.  ASSESSMENT:  Status post atherectomy and angioplasty of left popliteal and anterior tibial artery.  The patient has healed his wounds.  I think his biggest issue is swelling at this time.  He has been in compression stocking and has had some good results with these.  Because his ABIs did decrease today I will have him reduplexed in 3 months and will discuss these findings at that time.    Jorge Ny, MD Electronically Signed  VWB/MEDQ  D:  01/21/2011  T:  01/21/2011  Job:  [id number]

## 2011-03-13 ENCOUNTER — Emergency Department (HOSPITAL_COMMUNITY)
Admission: EM | Admit: 2011-03-13 | Discharge: 2011-03-14 | Disposition: A | Discharge status: Home / Self Care | Payer: Medicare Other | Attending: Emergency Medicine | Admitting: Emergency Medicine

## 2011-03-13 DIAGNOSIS — G40909 Epilepsy, unspecified, not intractable, without status epilepticus: Secondary | ICD-10-CM | POA: Insufficient documentation

## 2011-03-13 DIAGNOSIS — R339 Retention of urine, unspecified: Secondary | ICD-10-CM | POA: Insufficient documentation

## 2011-03-13 DIAGNOSIS — E119 Type 2 diabetes mellitus without complications: Secondary | ICD-10-CM | POA: Insufficient documentation

## 2011-03-13 DIAGNOSIS — Z79899 Other long term (current) drug therapy: Secondary | ICD-10-CM | POA: Insufficient documentation

## 2011-03-13 DIAGNOSIS — N390 Urinary tract infection, site not specified: Secondary | ICD-10-CM | POA: Insufficient documentation

## 2011-03-13 DIAGNOSIS — R3915 Urgency of urination: Secondary | ICD-10-CM | POA: Insufficient documentation

## 2011-03-13 DIAGNOSIS — E785 Hyperlipidemia, unspecified: Secondary | ICD-10-CM | POA: Insufficient documentation

## 2011-03-13 DIAGNOSIS — Z8673 Personal history of transient ischemic attack (TIA), and cerebral infarction without residual deficits: Secondary | ICD-10-CM | POA: Insufficient documentation

## 2011-03-13 DIAGNOSIS — I10 Essential (primary) hypertension: Secondary | ICD-10-CM | POA: Insufficient documentation

## 2011-03-13 DIAGNOSIS — Z794 Long term (current) use of insulin: Secondary | ICD-10-CM | POA: Insufficient documentation

## 2011-03-14 LAB — POCT I-STAT, CHEM 8
Calcium, Ion: 1.08 mmol/L — ABNORMAL LOW (ref 1.12–1.32)
HCT: 47 % (ref 39.0–52.0)
TCO2: 26 mmol/L (ref 0–100)

## 2011-03-14 LAB — URINALYSIS, ROUTINE W REFLEX MICROSCOPIC
Ketones, ur: NEGATIVE mg/dL
Nitrite: NEGATIVE
Protein, ur: 30 mg/dL — AB
Urobilinogen, UA: 0.2 mg/dL (ref 0.0–1.0)

## 2011-03-16 LAB — URINE CULTURE: Culture  Setup Time: 201208090437

## 2011-03-26 ENCOUNTER — Encounter: Payer: Self-pay | Admitting: Surgery

## 2011-04-20 NOTE — Discharge Summary (Addendum)
  NAME:  Nicholas [name], Nicholas [name] NO.:  [id number]  MEDICAL RECORD NO.:  [id number]  LOCATION:  6524                         FACILITY:  [facility]  PHYSICIAN:  Juleen China IV, MDDATE OF BIRTH:  1941/01/11  DATE OF ADMISSION:  12/11/2010 DATE OF DISCHARGE:  12/11/2010                              DISCHARGE SUMMARY   HISTORY OF PRESENT ILLNESS:  The patient was came to the operating room on Dec 11, 2010, for an angiogram for left leg ulcer.  He is 70 years old.  He had an atherectomy of the left popliteal artery and angioplasty of the left anterior tibial artery in the past.  He had recurrence of stenosis and so, he was admitted for an angiogram.  The patient had an atherectomy of the left anterior tibial artery, angioplasty of the left anterior tibial artery, and intraarterial administration of nitroglycerin.  Post this procedure, the patient was observed for number of hours.  His wounds were healing well and he was discharged to home. An Unna boot was applied, now was the only reason the patient was kept for any length of time.  This was not really an inpatient or an observation, that was an outpatient with wound care.  DISCHARGING DIAGNOSES:  The patient had an angiogram with procedure as above.  He will follow up with Dr. Myra Gianotti in a month.  The patient's medications were as on the outpatient procedure.  Home instructions with no changes.     Della Goo, PA-C   ______________________________ Seth Bake. Charlena Cross, MD    RR/MEDQ  D:  04/16/2011  T:  04/16/2011  Job:  [id number]  Electronically Signed by Della Goo PA on 04/20/2011 10:54:17 AM Electronically Signed by Arelia Longest IV MD on 05/04/2011 09:53:46 AM

## 2011-04-26 ENCOUNTER — Encounter: Payer: Self-pay | Admitting: Surgery

## 2011-04-29 ENCOUNTER — Ambulatory Visit (INDEPENDENT_AMBULATORY_CARE_PROVIDER_SITE_OTHER): Payer: Medicare Other | Admitting: Surgery

## 2011-04-29 ENCOUNTER — Encounter: Payer: Self-pay | Admitting: Surgery

## 2011-04-29 ENCOUNTER — Encounter (INDEPENDENT_AMBULATORY_CARE_PROVIDER_SITE_OTHER): Payer: Medicare Other | Admitting: *Deleted

## 2011-04-29 ENCOUNTER — Other Ambulatory Visit (INDEPENDENT_AMBULATORY_CARE_PROVIDER_SITE_OTHER): Payer: Medicare Other | Admitting: *Deleted

## 2011-04-29 VITALS — BP 170/77 | HR 57 | Resp 20 | Ht 69.0 in | Wt 270.0 lb

## 2011-04-29 DIAGNOSIS — I739 Peripheral vascular disease, unspecified: Secondary | ICD-10-CM

## 2011-04-29 DIAGNOSIS — I7025 Atherosclerosis of native arteries of other extremities with ulceration: Secondary | ICD-10-CM | POA: Insufficient documentation

## 2011-04-29 DIAGNOSIS — L98499 Non-pressure chronic ulcer of skin of other sites with unspecified severity: Secondary | ICD-10-CM

## 2011-04-29 DIAGNOSIS — Z48812 Encounter for surgical aftercare following surgery on the circulatory system: Secondary | ICD-10-CM

## 2011-04-29 DIAGNOSIS — R413 Other amnesia: Secondary | ICD-10-CM

## 2011-04-29 NOTE — Progress Notes (Signed)
[facility]   Patient name: Nicholas [name] MRN: [id number] DOB: 08-31-1940 Sex: male     Chief Complaint  Patient presents with  . Carotid    evaluate memory loss    HISTORY OF PRESENT ILLNESS: The patient is back today for followup. I initially met in February of 2012 for a left foot ulcer. He underwent atherectomy of the left popliteal artery and angioplasty of the left and left anterior tibial artery. He is back today for followup of his ulcer is now healed he still has the swelling in his left leg but overall he is doing very well. His biggest complaint is right knee pain  Past Medical History  Diagnosis Date  . CHF (congestive heart failure)   . Hypertension   . Diabetes mellitus   . CVA (cerebral infarction)   . Epilepsy   . Hx of CABG   . Peripheral vascular disease   . Ulcer     left foot  . Cellulitis   . Chronic kidney disease   . Dyslipidemia   . Chronic venous insufficiency   . Sleep apnea   . Morbid obesity   . Diverticulosis   . Benign prostatic hypertrophy   . Glaucoma   . Staphylococcus aureus infection   . Gout   . Depression   . Insomnia   . Reflux     Past Surgical History  Procedure Date  . Knee surgery     bilateral    History   Social History  . Marital Status: Married    Spouse Name: N/A    Number of Children: N/A  . Years of Education: N/A   Occupational History  . Not on file.   Social History Main Topics  . Smoking status: Former Games developer  . Smokeless tobacco: Never Used  . Alcohol Use: No     occasionallly  . Drug Use: No  . Sexually Active: Not on file   Other Topics Concern  . Not on file   Social History Narrative  . No narrative on file    Family History  Problem Relation Age of Onset  . Hypertension Other   . Cancer Other     Allergies as of 04/29/2011  . (No Known Allergies)    Current Outpatient Prescriptions on File Prior to Visit  Medication Sig Dispense Refill  .  allopurinol (ZYLOPRIM) 300 MG tablet Take 300 mg by mouth daily.        Marland Kitchen ALPRAZolam (XANAX) 1 MG tablet Take 1 mg by mouth 2 (two) times daily as needed. 1 bid, 2 at hs      . atenolol (TENORMIN) 50 MG tablet Take 50 mg by mouth 2 (two) times daily.        . brimonidine-timolol (COMBIGAN) 0.2-0.5 % ophthalmic solution Place 1 drop into both eyes every 12 (twelve) hours.        . clopidogrel (PLAVIX) 75 MG tablet Take 75 mg by mouth daily.        . divalproex (DEPAKOTE) 250 MG 24 hr tablet Take 250 mg by mouth daily.        . furosemide (LASIX) 80 MG tablet Take 80 mg by mouth 2 (two) times daily.        Marland Kitchen glipiZIDE (GLUCOTROL) 5 MG tablet Take 5 mg by mouth 2 (two) times daily before a meal.        . HYDROcodone-acetaminophen (VICODIN ES) 7.5-750 MG per tablet Take 1 tablet by mouth  every 6 (six) hours as needed.        . insulin glargine (LANTUS) 100 UNIT/ML injection Inject 30 Units into the skin at bedtime.        Marland Kitchen latanoprost (XALATAN) 0.005 % ophthalmic solution Place 1 drop into both eyes at bedtime.        . metolazone (ZAROXOLYN) 5 MG tablet Take 5 mg by mouth 2 (two) times a week.        . nitroGLYCERIN (NITROSTAT) 0.4 MG SL tablet Place 0.4 mg under the tongue every 5 (five) minutes as needed.        Marland Kitchen olopatadine (PATANOL) 0.1 % ophthalmic solution Place 1 drop into both eyes 2 (two) times daily.        Marland Kitchen omeprazole (PRILOSEC) 20 MG capsule Take 20 mg by mouth 2 (two) times daily.        . simvastatin (ZOCOR) 20 MG tablet Take 20 mg by mouth at bedtime.        . temazepam (RESTORIL) 30 MG capsule Take 30 mg by mouth at bedtime as needed.        . terazosin (HYTRIN) 5 MG capsule Take 5 mg by mouth at bedtime.        . timolol (BETIMOL) 0.5 % ophthalmic solution Place 1 drop into the left eye 2 (two) times daily.           REVIEW OF SYSTEMS: Unchanged  PHYSICAL EXAMINATION: General: The patient appears their stated age.  Vital signs are BP 170/77  Pulse 57  Resp 20  Ht 5\' 9"   (1.753 m)  Wt 270 lb (122.471 kg)  BMI 39.87 kg/m2 Pulmonary: There is a good air exchange bilaterally without wheezing or rales. Abdomen: Soft and non-tender with normal pitch bowel sounds. Musculoskeletal: There are no major deformities.  There is no significant extremity pain. Neurologic: No focal weakness or paresthesias are detected, Skin: There are no ulcer or rashes noted. Healed left foot ulcer Psychiatric: The patient has normal affect. Cardiovascular: There is a regular rate and rhythm without significant murmur appreciated.   Diagnostic Studies Carotid ultrasound today shows no hemodynamically significant stenosis of both internal carotid arteries. There are monophasic waveforms in the left superficial femoral popliteal and posterior tibial trunk arteries with elevated velocities of 439 and 252 in the mid popliteal and tibioperoneal trunk ABI today is 0.64 this is down  Assessment: Healed left foot ulcer Plan: The patient has now had significantly elevated velocities within his popliteal artery. I think the best course of action is to proceed with angiogram with intervention as indicated. We have scheduled this for Tuesday, October 2 I'll reattempt atherectomy. Eric with [facility] has been notified  V. Charlena Cross, M.D. [facility] Office: [phone]

## 2011-04-29 NOTE — Procedures (Unsigned)
LOWER EXTREMITY ARTERIAL DUPLEX  INDICATION:  Left lower extremity angioplasty.  HISTORY: Diabetes:  Yes. Cardiac:  No. Hypertension:  Yes. Smoking:  Previous. Previous Surgery:  Left popliteal and anterior tibial artery angioplasty on 10/02/2010, left anterior tibial artery re-do of angioplasty on 12/11/2010.  SINGLE LEVEL ARTERIAL EXAM                         RIGHT                LEFT Brachial: Anterior tibial: Posterior tibial: Peroneal: Ankle/Brachial Index:  LOWER EXTREMITY ARTERIAL DUPLEX EXAM  DUPLEX:  Monophasic Doppler waveforms noted throughout the left superficial femoral, popliteal, and peroneal-tibial trunk arteries. Velocities of 439 cm/s and 552 cm/s noted in the left mid popliteal and mid tibioperoneal trunk arteries, respectively.   IMPRESSION: 1. Patent left popliteal and anterior tibial artery angioplasty sites     with increased velocities, as described above. 2. Ankle brachial indices are noted on a separate report.         ___________________________________________ V. Charlena Cross, MD  CH/MEDQ  D:  04/29/2011  T:  04/29/2011  Job:  [id number]

## 2011-05-02 LAB — CARDIAC PANEL(CRET KIN+CKTOT+MB+TROPI)
CK, MB: 5.5 — ABNORMAL HIGH
Relative Index: 0.8
Total CK: 733 — ABNORMAL HIGH
Troponin I: 0.03

## 2011-05-02 LAB — CBC
Hemoglobin: 13.6
MCHC: 32.9
MCHC: 33.2
MCV: 92.1
Platelets: 204
RDW: 16 — ABNORMAL HIGH
RDW: 16 — ABNORMAL HIGH

## 2011-05-02 LAB — BLOOD GAS, ARTERIAL
Bicarbonate: 32.5 — ABNORMAL HIGH
O2 Content: 3
pCO2 arterial: 51.8 — ABNORMAL HIGH
pO2, Arterial: 74.9 — ABNORMAL LOW

## 2011-05-02 LAB — BASIC METABOLIC PANEL
CO2: 35 — ABNORMAL HIGH
Calcium: 8.5
Calcium: 8.8
Creatinine, Ser: 1
GFR calc Af Amer: >60
GFR calc Af Amer: >60
GFR calc non Af Amer: >60
GFR calc non Af Amer: >60
Potassium: 2.8 — ABNORMAL LOW
Sodium: 144

## 2011-05-02 LAB — COMPREHENSIVE METABOLIC PANEL
ALT: 13
AST: 19
CO2: 30
Calcium: 8.9
Creatinine, Ser: 1.36
GFR calc Af Amer: >60
GFR calc non Af Amer: 52 — ABNORMAL LOW
Sodium: 143
Total Protein: 5.9 — ABNORMAL LOW

## 2011-05-02 LAB — LACTIC ACID, PLASMA: Lactic Acid, Venous: 2.6 — ABNORMAL HIGH

## 2011-05-02 LAB — URINE CULTURE: Colony Count: NO GROWTH

## 2011-05-02 LAB — AMMONIA: Ammonia: 20

## 2011-05-02 LAB — URINALYSIS, ROUTINE W REFLEX MICROSCOPIC
Bilirubin Urine: NEGATIVE
Hgb urine dipstick: NEGATIVE
Specific Gravity, Urine: 1.019
pH: 5.5

## 2011-05-02 LAB — DIFFERENTIAL
Eosinophils Absolute: 0
Eosinophils Relative: 0
Lymphocytes Relative: 13
Lymphs Abs: 1.6
Monocytes Relative: 7
Neutrophils Relative %: 80 — ABNORMAL HIGH

## 2011-05-02 LAB — POCT CARDIAC MARKERS: CKMB, poc: 2.1

## 2011-05-02 LAB — HEMOGLOBIN A1C: Mean Plasma Glucose: 161

## 2011-05-02 LAB — PROTIME-INR: Prothrombin Time: 13.2

## 2011-05-02 LAB — APTT: aPTT: 28

## 2011-05-02 LAB — LIPASE, BLOOD: Lipase: 22

## 2011-05-07 ENCOUNTER — Ambulatory Visit (HOSPITAL_COMMUNITY)
Admission: RE | Admit: 2011-05-07 | Discharge: 2011-05-07 | Disposition: A | Discharge status: Home / Self Care | Payer: Medicare Other | Source: Ambulatory Visit | Attending: Surgery | Admitting: Surgery

## 2011-05-07 DIAGNOSIS — E119 Type 2 diabetes mellitus without complications: Secondary | ICD-10-CM | POA: Insufficient documentation

## 2011-05-07 DIAGNOSIS — I509 Heart failure, unspecified: Secondary | ICD-10-CM | POA: Insufficient documentation

## 2011-05-07 DIAGNOSIS — I10 Essential (primary) hypertension: Secondary | ICD-10-CM | POA: Insufficient documentation

## 2011-05-07 DIAGNOSIS — I2510 Atherosclerotic heart disease of native coronary artery without angina pectoris: Secondary | ICD-10-CM | POA: Insufficient documentation

## 2011-05-07 DIAGNOSIS — I70219 Atherosclerosis of native arteries of extremities with intermittent claudication, unspecified extremity: Secondary | ICD-10-CM

## 2011-05-07 LAB — POCT I-STAT, CHEM 8
Calcium, Ion: 1.19 mmol/L (ref 1.12–1.32)
Glucose, Bld: 89 mg/dL (ref 70–99)
HCT: 49 % (ref 39.0–52.0)
Hemoglobin: 16.7 g/dL (ref 13.0–17.0)
Potassium: 4.5 mEq/L (ref 3.5–5.1)

## 2011-05-07 LAB — GLUCOSE, CAPILLARY: Glucose-Capillary: 126 mg/dL — ABNORMAL HIGH (ref 70–99)

## 2011-05-07 LAB — POCT ACTIVATED CLOTTING TIME: Activated Clotting Time: 155 seconds

## 2011-05-14 LAB — COMPREHENSIVE METABOLIC PANEL WITH GFR
ALT: 13
AST: 15
Albumin: 3 — ABNORMAL LOW
Alkaline Phosphatase: 72
BUN: 27 — ABNORMAL HIGH
Chloride: 100
GFR calc Af Amer: 56 — ABNORMAL LOW
Potassium: 2.9 — ABNORMAL LOW
Sodium: 141
Total Bilirubin: 1.2
Total Protein: 6

## 2011-05-14 LAB — CBC
HCT: 43
HCT: 44.1
HCT: 44.7
Hemoglobin: 14.4
Hemoglobin: 14.9
MCHC: 33.3
MCV: 93.8
MCV: 95.8
MCV: 95.8
Platelets: 198
Platelets: 201
Platelets: 230
RBC: 4.76
RDW: 15.1
RDW: 15.2
RDW: 15.6 — ABNORMAL HIGH
WBC: 15.9 — ABNORMAL HIGH
WBC: 8.8

## 2011-05-14 LAB — POCT I-STAT 3, ART BLOOD GAS (G3+)
Acid-Base Excess: 7 — ABNORMAL HIGH
Bicarbonate: 33.6 — ABNORMAL HIGH
O2 Saturation: 97
Operator id: 257081
Patient temperature: 99.5
TCO2: 35
pCO2 arterial: 53.8 — ABNORMAL HIGH
pH, Arterial: 7.405
pO2, Arterial: 91

## 2011-05-14 LAB — CULTURE, BLOOD (ROUTINE X 2)
Culture: NO GROWTH
Culture: NO GROWTH

## 2011-05-14 LAB — BASIC METABOLIC PANEL
BUN: 13
BUN: 23
BUN: 6
BUN: 6
Calcium: 8.2 — ABNORMAL LOW
Calcium: 8.2 — ABNORMAL LOW
Chloride: 102
Creatinine, Ser: 0.93
Creatinine, Ser: 1.05
Creatinine, Ser: 1.17
GFR calc non Af Amer: >60
GFR calc non Af Amer: >60
GFR calc non Af Amer: >60
GFR calc non Af Amer: >60
Glucose, Bld: 139 — ABNORMAL HIGH
Glucose, Bld: 91
Glucose, Bld: 93
Glucose, Bld: 96
Potassium: 3.7
Sodium: 140

## 2011-05-14 LAB — URINE CULTURE: Colony Count: 40000

## 2011-05-14 LAB — DIFFERENTIAL
Basophils Absolute: 0.1
Basophils Relative: 0
Eosinophils Absolute: 0 — ABNORMAL LOW
Eosinophils Relative: 0
Lymphocytes Relative: 14
Lymphs Abs: 2.3
Monocytes Absolute: 0.9
Monocytes Relative: 6
Neutro Abs: 12.6 — ABNORMAL HIGH
Neutrophils Relative %: 80 — ABNORMAL HIGH

## 2011-05-14 LAB — URINALYSIS, ROUTINE W REFLEX MICROSCOPIC
Glucose, UA: NEGATIVE
Ketones, ur: 15 — AB
Nitrite: NEGATIVE
Protein, ur: 30 — AB
Specific Gravity, Urine: 1.028
Urobilinogen, UA: 1
pH: 5.5

## 2011-05-14 LAB — B-NATRIURETIC PEPTIDE (CONVERTED LAB)
Pro B Natriuretic peptide (BNP): 168 — ABNORMAL HIGH
Pro B Natriuretic peptide (BNP): 364 — ABNORMAL HIGH

## 2011-05-14 LAB — CARDIAC PANEL(CRET KIN+CKTOT+MB+TROPI)
CK, MB: 1.5
Relative Index: INVALID
Total CK: 70
Troponin I: 0.04
Troponin I: 0.05

## 2011-05-14 LAB — CK TOTAL AND CKMB (NOT AT ARMC)
CK, MB: 1.3
Relative Index: INVALID
Total CK: 84

## 2011-05-14 LAB — URINE MICROSCOPIC-ADD ON

## 2011-05-14 LAB — COMPREHENSIVE METABOLIC PANEL
CO2: 29
Calcium: 8.9
Creatinine, Ser: 1.51 — ABNORMAL HIGH
GFR calc non Af Amer: 47 — ABNORMAL LOW
Glucose, Bld: 189 — ABNORMAL HIGH

## 2011-05-14 LAB — LACTIC ACID, PLASMA: Lactic Acid, Venous: 1.7

## 2011-05-14 LAB — TROPONIN I: Troponin I: 0.04

## 2011-05-14 LAB — MISCELLANEOUS TEST

## 2011-05-14 LAB — HEMOGLOBIN A1C
Hgb A1c MFr Bld: 7.8 — ABNORMAL HIGH
Mean Plasma Glucose: 200

## 2011-05-14 LAB — LIPASE, BLOOD: Lipase: 20

## 2011-05-28 NOTE — Procedures (Unsigned)
CAROTID DUPLEX EXAM  INDICATION:  Memory loss.  HISTORY: Diabetes:  Yes. Cardiac:  No. Hypertension:  Yes. Smoking:  Previous. Previous Surgery:  Left lower extremity angioplasty. CV History:  Asymptomatic. Amaurosis Fugax No, Paresthesias No, Hemiparesis No.                                      RIGHT             LEFT Brachial systolic pressure:         164               169 Brachial Doppler waveforms:         Normal            Normal Vertebral direction of flow:        Not visualized    Antegrade DUPLEX VELOCITIES (cm/sec) CCA peak systolic                   65                154 ECA peak systolic                   140               140 ICA peak systolic                   72                101 ICA end diastolic                   9                 12 PLAQUE MORPHOLOGY:                  Heterogenous      Heterogenous PLAQUE AMOUNT:                      Mild              Mild PLAQUE LOCATION:                    ICA/ECA/CCA       ICA/ECA/CCA  IMPRESSION: 1. No hemodynamically significant stenoses noted in the bilateral     internal carotid arteries with plaque formations as described     above. 2. Left common carotid artery stenosis noted.  ___________________________________________ V. Charlena Cross, MD  CH/MEDQ  D:  04/29/2011  T:  04/29/2011  Job:  [id number]

## 2011-06-04 NOTE — Op Note (Signed)
NAME:  Nicholas, [name] NO.:  [id number]  MEDICAL RECORD NO.:  [id number]  LOCATION:  [facility]                         FACILITY:  [facility]  PHYSICIAN:  Juleen China IV, MDDATE OF BIRTH:  07/06/1941  DATE OF PROCEDURE:  05/07/2011 DATE OF DISCHARGE:                              OPERATIVE REPORT   PREOPERATIVE DIAGNOSIS:  Recurrent stenosis, left popliteal artery.  PROCEDURES: 1. Ultrasound access, right femoral artery. 2. Abdominal aortogram. 3. Left lower extremity runoff. 4. Third order catheterization. 5. Atherectomy, left popliteal artery. 6. Angioplasty of the left popliteal artery. 7. Intraarterial administration of nitroglycerin.  SURGEON: 1. Durene Cal IV, MD  INDICATIONS:  Nicholas [name] is a 70 year old gentleman who has previously undergone percutaneous intervention in his left popliteal and anterior tibial artery for a nonhealing wound.  He has subsequently healed his wound but was found to have elevated velocities on ultrasound.  He comes in today for diagnostic study and possible intervention.  PROCEDURE IN DETAIL:  The patient was identified in the holding area, taken to room 8, and placed supine on the table.  The right groin was prepped and draped in the usual fashion.  Time-out was called.  The right femoral artery was identified with ultrasound and found to be heavily calcified but patent.  It was then accessed under ultrasound guidance with a micropuncture needle and 0.018 wire was advanced into the aorta.  Under fluoroscopic visualization, a micropuncture sheath was placed.  This was exchanged out over a Amplatz wire and a 5-French sheath was placed.  Over the wire, an Omni flush catheter was advanced to the level of L1 and abdominal aortogram was obtained.  Next using the Omni flush catheter and a Bentson wire, the aortic bifurcation was crossed, the catheter was placed, and the left external iliac artery and left leg runoff was  performed.  FINDINGS:  Aortogram:  The visualized portions of the suprarenal abdominal aorta show no significant disease.  Left renal artery is widely patent.  There is an accessory right renal artery.  There is no evidence of right renal artery stenosis.  The infrarenal abdominal aorta is heavily calcified without significant stenoses.  Bilateral common, internal, and external iliac arteries are patent without hemodynamically significant stenosis.  Left lower extremity:  Left common femoral artery is heavily calcified but patent without significant stenosis.  The profunda femoral artery is patent.  The superficial femoral artery is patent throughout its course. There are 2 focal lesions within the popliteal artery.  The first is in the above-knee popliteal artery as it crosses the adductor canal.  This is approximately a 70% stenosis.  The second is an 80% stenosis behind the knee in the popliteal artery.  There is a non-flow limiting dissection within the anterior tibial artery origin.  The anterior tibial artery is a dominant runoff, a complete plantar arch.  The posterior tibial artery becomes diminutive at the ankle.  Peroneal artery is also patent.  At this point in time, the decision was made to intervene.  Over an Amplatz Super Stiff wire, a 6-French Ansel 1 sheath was placed.  Then used a Viper wire and NaviCross angled catheter to get wire access into  the peroneal artery.  Contrast injections were performed through the NaviCross into the sheath to make sure the wire was in the appropriate location.  The patient was fully heparinized.  Next, a 2.0 Stealth Classic CSI device was prepared and then atherectomy was performed at low medium and high speeds in the diseased portions of the popliteal artery.  Intraarterial administration of 400 mcg of nitroglycerin was performed.  I then performed balloon angioplasty of the atherectomized area with a 5 x 100 balloon.  The balloon was  taken to 5 atmospheres and held out for 90 seconds.  A completion arteriogram was then performed which showed significantly improved results through the treated area. There was residual stenosis of approximately 20% within the popliteal artery behind the knee.  I did not feel that upsizing the balloon or repeating the atherectomy was warranted in this situation as the blood flow across this area had been dramatically improved.  At this point, the catheters and wires were removed, the sheath was pulled back to the right external iliac artery, and the patient was taken to the holding area for a sheath pull once his coagulation profile corrects.  IMPRESSION: 1. Heavily calcified femoral vessels. 2. Tandem stenosis within the popliteal artery successfully treated     using a CSI 2.0 device and a 5-mm balloon.  Residual stenosis in     the distal lesion was approximately 20%. 3. Non-flow limiting dissection at the origin of the anterior tibial     artery which is the dominant runoff vessel.     Nicholas Ny, MD     VWB/MEDQ  D:  05/07/2011  T:  05/07/2011  Job:  [id number]  Electronically Signed by Arelia Longest IV MD on 06/04/2011 09:45:55 PM

## 2011-06-13 ENCOUNTER — Other Ambulatory Visit: Payer: Self-pay | Admitting: Surgery

## 2011-06-14 ENCOUNTER — Encounter: Payer: Self-pay | Admitting: Surgery

## 2011-06-17 ENCOUNTER — Ambulatory Visit (INDEPENDENT_AMBULATORY_CARE_PROVIDER_SITE_OTHER): Payer: Medicare Other | Admitting: *Deleted

## 2011-06-17 ENCOUNTER — Other Ambulatory Visit (INDEPENDENT_AMBULATORY_CARE_PROVIDER_SITE_OTHER): Payer: Medicare Other | Admitting: *Deleted

## 2011-06-17 ENCOUNTER — Ambulatory Visit (INDEPENDENT_AMBULATORY_CARE_PROVIDER_SITE_OTHER): Payer: Medicare Other | Admitting: Surgery

## 2011-06-17 ENCOUNTER — Encounter: Payer: Self-pay | Admitting: Surgery

## 2011-06-17 VITALS — BP 125/69 | HR 61 | Resp 14 | Ht 69.0 in | Wt 270.0 lb

## 2011-06-17 DIAGNOSIS — E1159 Type 2 diabetes mellitus with other circulatory complications: Secondary | ICD-10-CM

## 2011-06-17 DIAGNOSIS — I739 Peripheral vascular disease, unspecified: Secondary | ICD-10-CM

## 2011-06-17 DIAGNOSIS — Z48812 Encounter for surgical aftercare following surgery on the circulatory system: Secondary | ICD-10-CM

## 2011-06-17 NOTE — Procedures (Unsigned)
LOWER EXTREMITY ARTERIAL DUPLEX  INDICATION:  Followup left popliteal atherectomy and angioplasty on 05/07/2011  HISTORY: Diabetes:  Yes Cardiac:  Yes Hypertension:  Yes Smoking:  Previous Previous Surgery:  Left popliteal and anterior tibial artery angioplasty 10/02/2010; left anterior tibial artery angioplasty 12/11/2010; left popliteal atherectomy and angioplasty 05/07/2011  SINGLE LEVEL ARTERIAL EXAM                          RIGHT                LEFT Brachial: Anterior tibial: Posterior tibial: Peroneal: Ankle/Brachial Index:  Previous ABI 01/21/2011:                        0.67 0.57  LOWER EXTREMITY ARTERIAL DUPLEX EXAM  DUPLEX:  Patent left lower extremity arteries with significant stenosis of distal popliteal artery (approximately 50%) with post surgical changes observed. All waveforms are monophasic with turbulence observed in the common femoral artery which may indicate proximal disease.  IMPRESSION:  Significant stenosis of the distal left popliteal artery as described above.  ___________________________________________ V. Charlena Cross, MD  LT/MEDQ  D:  06/17/2011  T:  06/17/2011  Job:  [id number]

## 2011-06-17 NOTE — Progress Notes (Signed)
[facility] [place]    Patient name: Nicholas [name] MRN: [id number] DOB: [birth year] Sex: male   CC:  Foot pain  HISTORY OF PRESENT ILLNESS:  The patient is back today for followup. I initially met in February of 2012 for a left foot ulcer. He underwent atherectomy of the left popliteal artery and angioplasty of the left and left anterior tibial artery. At his last visit he was found to have elevated velocities within his popliteal artery. He was taken for repeat arteriogram residual stenosis was found in the popliteal artery this was treated with atherectomy and angioplasty. He is back today for followup he has no new complaints.  Past Medical History   Diagnosis  Date   .  CHF (congestive heart failure)    .  Hypertension    .  Diabetes mellitus    .  CVA (cerebral infarction)    .  Epilepsy    .  Hx of CABG    .  Peripheral vascular disease    .  Ulcer      left foot   .  Cellulitis    .  Chronic kidney disease    .  Dyslipidemia    .  Chronic venous insufficiency    .  Sleep apnea    .  Morbid obesity    .  Diverticulosis    .  Benign prostatic hypertrophy    .  Glaucoma    .  Staphylococcus aureus infection    .  Gout    .  Depression    .  Insomnia    .  Reflux     Past Surgical History   Procedure  Date   .  Knee surgery      bilateral    History    Social History   .  Marital Status:  Married     Spouse Name:  N/A     Number of Children:  N/A   .  Years of Education:  N/A    Occupational History   .  Not on file.    Social History Main Topics   .  Smoking status:  Former Games developer   .  Smokeless tobacco:  Never Used   .  Alcohol Use:  No      occasionallly   .  Drug Use:  No   .  Sexually Active:  Not on file    Other Topics  Concern   .  Not on file    Social History Narrative   .  No narrative on file    Family History   Problem  Relation  Age of Onset   .  Hypertension  Other    .  Cancer  Other     Allergies as of  04/29/2011   .  (No Known Allergies)    Current Outpatient Prescriptions on File Prior to Visit   Medication  Sig  Dispense  Refill   .  allopurinol (ZYLOPRIM) 300 MG tablet  Take 300 mg by mouth daily.     Marland Kitchen  ALPRAZolam (XANAX) 1 MG tablet  Take 1 mg by mouth 2 (two) times daily as needed. 1 bid, 2 at hs     .  atenolol (TENORMIN) 50 MG tablet  Take 50 mg by mouth 2 (two) times daily.     .  brimonidine-timolol (COMBIGAN) 0.2-0.5 % ophthalmic solution  Place 1 drop into both eyes every 12 (twelve) hours.     Marland Kitchen  clopidogrel (PLAVIX) 75 MG tablet  Take 75 mg by mouth daily.     .  divalproex (DEPAKOTE) 250 MG 24 hr tablet  Take 250 mg by mouth daily.     .  furosemide (LASIX) 80 MG tablet  Take 80 mg by mouth 2 (two) times daily.     Marland Kitchen  glipiZIDE (GLUCOTROL) 5 MG tablet  Take 5 mg by mouth 2 (two) times daily before a meal.     .  HYDROcodone-acetaminophen (VICODIN ES) 7.5-750 MG per tablet  Take 1 tablet by mouth every 6 (six) hours as needed.     .  insulin glargine (LANTUS) 100 UNIT/ML injection  Inject 30 Units into the skin at bedtime.     Marland Kitchen  latanoprost (XALATAN) 0.005 % ophthalmic solution  Place 1 drop into both eyes at bedtime.     .  metolazone (ZAROXOLYN) 5 MG tablet  Take 5 mg by mouth 2 (two) times a week.     .  nitroGLYCERIN (NITROSTAT) 0.4 MG SL tablet  Place 0.4 mg under the tongue every 5 (five) minutes as needed.     Marland Kitchen  olopatadine (PATANOL) 0.1 % ophthalmic solution  Place 1 drop into both eyes 2 (two) times daily.     Marland Kitchen  omeprazole (PRILOSEC) 20 MG capsule  Take 20 mg by mouth 2 (two) times daily.     .  simvastatin (ZOCOR) 20 MG tablet  Take 20 mg by mouth at bedtime.     .  temazepam (RESTORIL) 30 MG capsule  Take 30 mg by mouth at bedtime as needed.     .  terazosin (HYTRIN) 5 MG capsule  Take 5 mg by mouth at bedtime.     .  timolol (BETIMOL) 0.5 % ophthalmic solution  Place 1 drop into the left eye 2 (two) times daily.     REVIEW OF SYSTEMS:  Unchanged  PHYSICAL  EXAMINATION:  General: The patient appears their stated age. Vital signs are BP 170/77  Pulse 57  Resp 20  Ht 5\' 9"  (1.753 m)  Wt 270 lb (122.471 kg)  BMI 39.87 kg/m2  Pulmonary: There is a good air exchange bilaterally without wheezing or rales.  Neurologic: No focal weakness or paresthesias are detected,  Skin: There are no ulcer or rashes noted. Healed left foot ulcer  Psychiatric: The patient has normal affect.  Cardiovascular: Pedal pulses are not palpable there is significant bilateral edema Diagnostic Studies  Lower show me study was performed today which was ordered and reviewed this shows distal popliteal artery stenosis and a 50-75% range velocity peak velocity elevation is 2 98 cm/s ABIs are essentially unchanged Assessment:  Healed left foot ulcer  Plan:  I discussed the residual stenosis in the left popliteal artery. I do not feel this needs to be treated at this time given his recent interventions. We will keep a close eye on this with ultrasound surveillance his next that he will be in 3 months.   Jorge Ny, M.D.  [facility] [place]  Office: [phone]

## 2011-07-29 ENCOUNTER — Other Ambulatory Visit: Payer: Self-pay

## 2011-07-29 ENCOUNTER — Inpatient Hospital Stay (HOSPITAL_COMMUNITY)
Admission: EM | Admit: 2011-07-29 | Discharge: 2011-08-02 | DRG: 871 | Disposition: A | Discharge status: Home Health | Payer: Medicare Other | Attending: Internal Medicine | Admitting: Internal Medicine

## 2011-07-29 ENCOUNTER — Emergency Department (HOSPITAL_COMMUNITY): Payer: Medicare Other

## 2011-07-29 ENCOUNTER — Encounter (HOSPITAL_COMMUNITY): Payer: Self-pay

## 2011-07-29 DIAGNOSIS — E785 Hyperlipidemia, unspecified: Secondary | ICD-10-CM | POA: Diagnosis present

## 2011-07-29 DIAGNOSIS — R6520 Severe sepsis without septic shock: Secondary | ICD-10-CM

## 2011-07-29 DIAGNOSIS — F411 Generalized anxiety disorder: Secondary | ICD-10-CM | POA: Diagnosis present

## 2011-07-29 DIAGNOSIS — I10 Essential (primary) hypertension: Secondary | ICD-10-CM

## 2011-07-29 DIAGNOSIS — H409 Unspecified glaucoma: Secondary | ICD-10-CM | POA: Diagnosis present

## 2011-07-29 DIAGNOSIS — A419 Sepsis, unspecified organism: Principal | ICD-10-CM

## 2011-07-29 DIAGNOSIS — M1710 Unilateral primary osteoarthritis, unspecified knee: Secondary | ICD-10-CM | POA: Diagnosis present

## 2011-07-29 DIAGNOSIS — I739 Peripheral vascular disease, unspecified: Secondary | ICD-10-CM

## 2011-07-29 DIAGNOSIS — N179 Acute kidney failure, unspecified: Secondary | ICD-10-CM | POA: Diagnosis present

## 2011-07-29 DIAGNOSIS — I509 Heart failure, unspecified: Secondary | ICD-10-CM

## 2011-07-29 DIAGNOSIS — L0390 Cellulitis, unspecified: Secondary | ICD-10-CM

## 2011-07-29 DIAGNOSIS — E872 Acidosis: Secondary | ICD-10-CM | POA: Diagnosis present

## 2011-07-29 DIAGNOSIS — E1169 Type 2 diabetes mellitus with other specified complication: Secondary | ICD-10-CM | POA: Diagnosis present

## 2011-07-29 DIAGNOSIS — E1159 Type 2 diabetes mellitus with other circulatory complications: Secondary | ICD-10-CM | POA: Diagnosis present

## 2011-07-29 DIAGNOSIS — F419 Anxiety disorder, unspecified: Secondary | ICD-10-CM | POA: Diagnosis present

## 2011-07-29 DIAGNOSIS — L03116 Cellulitis of left lower limb: Secondary | ICD-10-CM

## 2011-07-29 DIAGNOSIS — R6510 Systemic inflammatory response syndrome (SIRS) of non-infectious origin without acute organ dysfunction: Secondary | ICD-10-CM | POA: Diagnosis present

## 2011-07-29 DIAGNOSIS — L8993 Pressure ulcer of unspecified site, stage 3: Secondary | ICD-10-CM | POA: Diagnosis present

## 2011-07-29 DIAGNOSIS — L89109 Pressure ulcer of unspecified part of back, unspecified stage: Secondary | ICD-10-CM | POA: Diagnosis present

## 2011-07-29 DIAGNOSIS — E860 Dehydration: Secondary | ICD-10-CM

## 2011-07-29 DIAGNOSIS — E119 Type 2 diabetes mellitus without complications: Secondary | ICD-10-CM

## 2011-07-29 DIAGNOSIS — L02419 Cutaneous abscess of limb, unspecified: Secondary | ICD-10-CM | POA: Diagnosis present

## 2011-07-29 DIAGNOSIS — N400 Enlarged prostate without lower urinary tract symptoms: Secondary | ICD-10-CM | POA: Diagnosis present

## 2011-07-29 DIAGNOSIS — N289 Disorder of kidney and ureter, unspecified: Secondary | ICD-10-CM

## 2011-07-29 DIAGNOSIS — R509 Fever, unspecified: Secondary | ICD-10-CM

## 2011-07-29 DIAGNOSIS — L97509 Non-pressure chronic ulcer of other part of unspecified foot with unspecified severity: Secondary | ICD-10-CM

## 2011-07-29 LAB — URINALYSIS, ROUTINE W REFLEX MICROSCOPIC
Bilirubin Urine: NEGATIVE
Glucose, UA: NEGATIVE mg/dL
Ketones, ur: NEGATIVE mg/dL
Leukocytes, UA: NEGATIVE
Nitrite: NEGATIVE
Protein, ur: 100 mg/dL — AB
Specific Gravity, Urine: 1.017 (ref 1.005–1.030)
Urobilinogen, UA: 2 mg/dL — ABNORMAL HIGH (ref 0.0–1.0)
pH: 7.5 (ref 5.0–8.0)

## 2011-07-29 LAB — TSH: TSH: 0.69 u[IU]/mL (ref 0.350–4.500)

## 2011-07-29 LAB — LACTIC ACID, PLASMA: Lactic Acid, Venous: 3.1 mmol/L — ABNORMAL HIGH (ref 0.5–2.2)

## 2011-07-29 LAB — BASIC METABOLIC PANEL
CO2: 24 mEq/L (ref 19–32)
Calcium: 9 mg/dL (ref 8.4–10.5)
Chloride: 105 mEq/L (ref 96–112)
Creatinine, Ser: 1.36 mg/dL — ABNORMAL HIGH (ref 0.50–1.35)
Glucose, Bld: 152 mg/dL — ABNORMAL HIGH (ref 70–99)

## 2011-07-29 LAB — COMPREHENSIVE METABOLIC PANEL
ALT: 8 U/L (ref 0–53)
AST: 12 U/L (ref 0–37)
Albumin: 3.6 g/dL (ref 3.5–5.2)
Alkaline Phosphatase: 112 U/L (ref 39–117)
BUN: 32 mg/dL — ABNORMAL HIGH (ref 6–23)
CO2: 28 mEq/L (ref 19–32)
Calcium: 10.2 mg/dL (ref 8.4–10.5)
Chloride: 101 mEq/L (ref 96–112)
Creatinine, Ser: 1.55 mg/dL — ABNORMAL HIGH (ref 0.50–1.35)
GFR calc Af Amer: 51 mL/min — ABNORMAL LOW (ref >90)
GFR calc non Af Amer: 44 mL/min — ABNORMAL LOW (ref >90)
Glucose, Bld: 166 mg/dL — ABNORMAL HIGH (ref 70–99)
Potassium: 5.6 mEq/L — ABNORMAL HIGH (ref 3.5–5.1)
Sodium: 140 mEq/L (ref 135–145)
Total Bilirubin: 0.5 mg/dL (ref 0.3–1.2)
Total Protein: 7.9 g/dL (ref 6.0–8.3)

## 2011-07-29 LAB — CBC
HCT: 49 % (ref 39.0–52.0)
Hemoglobin: 15.8 g/dL (ref 13.0–17.0)
MCH: 30.4 pg (ref 26.0–34.0)
MCHC: 32.2 g/dL (ref 30.0–36.0)
MCHC: 32.1 g/dL (ref 30.0–36.0)
MCV: 94.4 fL (ref 78.0–100.0)
Platelets: 238 10*3/uL (ref 150–400)
Platelets: 210 10*3/uL (ref 150–400)
RBC: 5.19 MIL/uL (ref 4.22–5.81)
RDW: 14.7 % (ref 11.5–15.5)
RDW: 14.8 % (ref 11.5–15.5)
WBC: 27.8 10*3/uL — ABNORMAL HIGH (ref 4.0–10.5)
WBC: 22.1 10*3/uL — ABNORMAL HIGH (ref 4.0–10.5)

## 2011-07-29 LAB — DIFFERENTIAL
Basophils Absolute: 0 10*3/uL (ref 0.0–0.1)
Basophils Relative: 0 % (ref 0–1)
Eosinophils Absolute: 0 10*3/uL (ref 0.0–0.7)
Eosinophils Relative: 0 % (ref 0–5)
Lymphocytes Relative: 10 % — ABNORMAL LOW (ref 12–46)
Lymphs Abs: 2.8 10*3/uL (ref 0.7–4.0)
Monocytes Absolute: 1.4 10*3/uL — ABNORMAL HIGH (ref 0.1–1.0)
Monocytes Relative: 5 % (ref 3–12)
Neutro Abs: 23.6 10*3/uL — ABNORMAL HIGH (ref 1.7–7.7)
Neutrophils Relative %: 85 % — ABNORMAL HIGH (ref 43–77)

## 2011-07-29 LAB — GLUCOSE, CAPILLARY
Glucose-Capillary: 64 mg/dL — ABNORMAL LOW (ref 70–99)
Glucose-Capillary: 105 mg/dL — ABNORMAL HIGH (ref 70–99)

## 2011-07-29 LAB — SAMPLE TO BLOOD BANK

## 2011-07-29 LAB — URINE MICROSCOPIC-ADD ON

## 2011-07-29 LAB — PHOSPHORUS: Phosphorus: 2.1 mg/dL — ABNORMAL LOW (ref 2.3–4.6)

## 2011-07-29 LAB — INFLUENZA PANEL BY PCR (TYPE A & B): Influenza B By PCR: NEGATIVE

## 2011-07-29 LAB — MRSA PCR SCREENING: MRSA by PCR: POSITIVE — AB

## 2011-07-29 LAB — MAGNESIUM: Magnesium: 1.5 mg/dL (ref 1.5–2.5)

## 2011-07-29 LAB — PROCALCITONIN: Procalcitonin: 23.83 ng/mL

## 2011-07-29 MED ORDER — ACETAMINOPHEN 325 MG PO TABS: 650.0000 mg | ORAL_TABLET | Freq: Four times a day (QID) | ORAL | Status: DC | PRN

## 2011-07-29 MED ORDER — ONDANSETRON HCL 4 MG/2ML IJ SOLN: 4.0000 mg | Freq: Four times a day (QID) | INTRAMUSCULAR | Status: DC | PRN

## 2011-07-29 MED ORDER — SODIUM CHLORIDE 0.9 % IV BOLUS (SEPSIS)
2000.0000 mL | Freq: Once | INTRAVENOUS | Status: AC
  Administered 2011-07-29: 2000 mL via INTRAVENOUS

## 2011-07-29 MED ORDER — TIMOLOL MALEATE 0.5 % OP SOLN
1.0000 [drp] | Freq: Two times a day (BID) | OPHTHALMIC | Status: DC
  Administered 2011-07-29 – 2011-08-02 (×9): 1 [drp] via OPHTHALMIC

## 2011-07-29 MED ORDER — VANCOMYCIN HCL IN DEXTROSE 1-5 GM/200ML-% IV SOLN
1000.0000 mg | Freq: Once | INTRAVENOUS | Status: AC
  Administered 2011-07-29: 1000 mg via INTRAVENOUS
  Filled 2011-07-29: qty 200

## 2011-07-29 MED ORDER — ACETAMINOPHEN 650 MG RE SUPP: 650.0000 mg | Freq: Four times a day (QID) | RECTAL | Status: DC | PRN

## 2011-07-29 MED ORDER — ACETAMINOPHEN 650 MG RE SUPP
975.0000 mg | Freq: Once | RECTAL | Status: AC
  Administered 2011-07-29: 975 mg via RECTAL
  Filled 2011-07-29: qty 1

## 2011-07-29 MED ORDER — SODIUM CHLORIDE 0.9 % IV SOLN
INTRAVENOUS | Status: AC
  Administered 2011-07-29: 12:00:00 via INTRAVENOUS

## 2011-07-29 MED ORDER — LATANOPROST 0.005 % OP SOLN
1.0000 [drp] | Freq: Every day | OPHTHALMIC | Status: DC
  Administered 2011-07-29 – 2011-08-01 (×4): 1 [drp] via OPHTHALMIC
  Filled 2011-07-29: qty 2.5

## 2011-07-29 MED ORDER — VANCOMYCIN HCL 1000 MG IV SOLR
2000.0000 mg | INTRAVENOUS | Status: DC
  Administered 2011-07-30: 2000 mg via INTRAVENOUS
  Filled 2011-07-29 (×4): qty 2000

## 2011-07-29 MED ORDER — DEXTROSE 5 % IV SOLN
1.0000 g | Freq: Two times a day (BID) | INTRAVENOUS | Status: DC
  Administered 2011-07-29 – 2011-07-31 (×6): 1 g via INTRAVENOUS
  Filled 2011-07-29 (×8): qty 1

## 2011-07-29 MED ORDER — ONDANSETRON HCL 4 MG PO TABS: 4.0000 mg | ORAL_TABLET | Freq: Four times a day (QID) | ORAL | Status: DC | PRN

## 2011-07-29 MED ORDER — MORPHINE SULFATE 2 MG/ML IJ SOLN: 2.0000 mg | INTRAMUSCULAR | Status: DC | PRN

## 2011-07-29 MED ORDER — VITAMINS A & D EX OINT
TOPICAL_OINTMENT | CUTANEOUS | Status: AC
  Administered 2011-07-29: 20:00:00
  Filled 2011-07-29: qty 5

## 2011-07-29 MED ORDER — TERAZOSIN HCL 5 MG PO CAPS
5.0000 mg | ORAL_CAPSULE | Freq: Every day | ORAL | Status: DC
  Administered 2011-07-29 – 2011-08-01 (×4): 5 mg via ORAL
  Filled 2011-07-29 (×6): qty 1

## 2011-07-29 MED ORDER — BRIMONIDINE TARTRATE-TIMOLOL 0.2-0.5 % OP SOLN: 1.0000 [drp] | Freq: Two times a day (BID) | OPHTHALMIC | Status: DC

## 2011-07-29 MED ORDER — MUPIROCIN 2 % EX OINT
1.0000 "application " | TOPICAL_OINTMENT | Freq: Two times a day (BID) | CUTANEOUS | Status: DC
  Administered 2011-07-29 – 2011-08-02 (×8): 1 via NASAL
  Filled 2011-07-29: qty 22

## 2011-07-29 MED ORDER — SIMVASTATIN 20 MG PO TABS
20.0000 mg | ORAL_TABLET | Freq: Every day | ORAL | Status: DC
  Administered 2011-07-29 – 2011-08-01 (×4): 20 mg via ORAL
  Filled 2011-07-29 (×6): qty 1

## 2011-07-29 MED ORDER — TEMAZEPAM 15 MG PO CAPS
30.0000 mg | ORAL_CAPSULE | Freq: Every evening | ORAL | Status: DC | PRN
  Administered 2011-07-29 – 2011-07-31 (×2): 30 mg via ORAL
  Filled 2011-07-29: qty 1
  Filled 2011-07-29: qty 2
  Filled 2011-07-29: qty 1

## 2011-07-29 MED ORDER — BRIMONIDINE TARTRATE 0.2 % OP SOLN
1.0000 [drp] | Freq: Two times a day (BID) | OPHTHALMIC | Status: DC
  Administered 2011-07-29 – 2011-08-02 (×9): 1 [drp] via OPHTHALMIC
  Filled 2011-07-29: qty 5

## 2011-07-29 MED ORDER — METRONIDAZOLE IN NACL 5-0.79 MG/ML-% IV SOLN: 500.0000 mg | Freq: Three times a day (TID) | INTRAVENOUS | Status: DC

## 2011-07-29 MED ORDER — IBUPROFEN 200 MG PO TABS
400.0000 mg | ORAL_TABLET | Freq: Once | ORAL | Status: AC
  Administered 2011-07-29: 400 mg via ORAL
  Filled 2011-07-29: qty 2

## 2011-07-29 MED ORDER — ATENOLOL 50 MG PO TABS
50.0000 mg | ORAL_TABLET | Freq: Two times a day (BID) | ORAL | Status: DC
  Administered 2011-07-29 – 2011-08-02 (×9): 50 mg via ORAL
  Filled 2011-07-29 (×11): qty 1

## 2011-07-29 MED ORDER — NITROGLYCERIN 0.4 MG SL SUBL: 0.4000 mg | SUBLINGUAL_TABLET | SUBLINGUAL | Status: DC | PRN

## 2011-07-29 MED ORDER — TIMOLOL HEMIHYDRATE 0.5 % OP SOLN: 1.0000 [drp] | Freq: Two times a day (BID) | OPHTHALMIC | Status: DC

## 2011-07-29 MED ORDER — BIOTENE DRY MOUTH MT LIQD
15.0000 mL | Freq: Two times a day (BID) | OROMUCOSAL | Status: DC
  Administered 2011-07-29 – 2011-08-02 (×8): 15 mL via OROMUCOSAL

## 2011-07-29 MED ORDER — POLYETHYLENE GLYCOL 3350 17 G PO PACK
17.0000 g | PACK | Freq: Every day | ORAL | Status: DC | PRN
  Filled 2011-07-29: qty 1

## 2011-07-29 MED ORDER — INSULIN GLARGINE 100 UNIT/ML ~~LOC~~ SOLN
5.0000 [IU] | Freq: Every day | SUBCUTANEOUS | Status: DC
  Administered 2011-07-30 – 2011-08-01 (×3): 5 [IU] via SUBCUTANEOUS
  Filled 2011-07-29: qty 3

## 2011-07-29 MED ORDER — INSULIN ASPART 100 UNIT/ML ~~LOC~~ SOLN
0.0000 [IU] | Freq: Three times a day (TID) | SUBCUTANEOUS | Status: DC
  Administered 2011-07-30 – 2011-07-31 (×3): 2 [IU] via SUBCUTANEOUS
  Administered 2011-07-31 – 2011-08-01 (×2): 1 [IU] via SUBCUTANEOUS
  Administered 2011-08-01 (×2): 2 [IU] via SUBCUTANEOUS
  Administered 2011-08-02: 1 [IU] via SUBCUTANEOUS
  Filled 2011-07-29: qty 3

## 2011-07-29 MED ORDER — VANCOMYCIN HCL 1000 MG IV SOLR
1500.0000 mg | Freq: Once | INTRAVENOUS | Status: AC
  Administered 2011-07-29: 1500 mg via INTRAVENOUS
  Filled 2011-07-29: qty 1500

## 2011-07-29 MED ORDER — PANTOPRAZOLE SODIUM 40 MG PO TBEC
40.0000 mg | DELAYED_RELEASE_TABLET | Freq: Every day | ORAL | Status: DC
  Administered 2011-07-29 – 2011-08-01 (×4): 40 mg via ORAL
  Filled 2011-07-29 (×6): qty 1

## 2011-07-29 MED ORDER — FUROSEMIDE 40 MG PO TABS
40.0000 mg | ORAL_TABLET | Freq: Every day | ORAL | Status: DC
  Administered 2011-07-29 – 2011-07-31 (×2): 40 mg via ORAL
  Filled 2011-07-29 (×4): qty 1

## 2011-07-29 MED ORDER — METRONIDAZOLE IN NACL 5-0.79 MG/ML-% IV SOLN
500.0000 mg | Freq: Three times a day (TID) | INTRAVENOUS | Status: DC
  Administered 2011-07-29 – 2011-07-30 (×4): 500 mg via INTRAVENOUS
  Filled 2011-07-29 (×5): qty 100

## 2011-07-29 MED ORDER — CLOPIDOGREL BISULFATE 75 MG PO TABS
75.0000 mg | ORAL_TABLET | Freq: Every day | ORAL | Status: DC
  Administered 2011-07-29 – 2011-08-02 (×5): 75 mg via ORAL
  Filled 2011-07-29 (×5): qty 1

## 2011-07-29 MED ORDER — OLOPATADINE HCL 0.1 % OP SOLN
1.0000 [drp] | Freq: Two times a day (BID) | OPHTHALMIC | Status: DC
  Administered 2011-07-29 – 2011-08-02 (×9): 1 [drp] via OPHTHALMIC
  Filled 2011-07-29: qty 5

## 2011-07-29 MED ORDER — ENOXAPARIN SODIUM 40 MG/0.4ML ~~LOC~~ SOLN: 40.0000 mg | SUBCUTANEOUS | Status: DC

## 2011-07-29 MED ORDER — SODIUM CHLORIDE 0.9 % IV BOLUS (SEPSIS)
1000.0000 mL | Freq: Once | INTRAVENOUS | Status: AC
  Administered 2011-07-29: 1000 mL via INTRAVENOUS

## 2011-07-29 MED ORDER — INSULIN ASPART 100 UNIT/ML ~~LOC~~ SOLN
0.0000 [IU] | Freq: Every day | SUBCUTANEOUS | Status: DC
  Filled 2011-07-29: qty 3

## 2011-07-29 MED ORDER — ENOXAPARIN SODIUM 60 MG/0.6ML ~~LOC~~ SOLN
60.0000 mg | Freq: Every day | SUBCUTANEOUS | Status: DC
  Administered 2011-07-29 – 2011-08-02 (×5): 60 mg via SUBCUTANEOUS
  Filled 2011-07-29 (×5): qty 0.6

## 2011-07-29 MED ORDER — CHLORHEXIDINE GLUCONATE CLOTH 2 % EX PADS
6.0000 | MEDICATED_PAD | Freq: Every day | CUTANEOUS | Status: DC
  Administered 2011-07-30 – 2011-08-02 (×4): 6 via TOPICAL

## 2011-07-29 MED ORDER — ALPRAZOLAM 1 MG PO TABS
1.0000 mg | ORAL_TABLET | Freq: Two times a day (BID) | ORAL | Status: DC | PRN
  Administered 2011-07-29 – 2011-08-01 (×4): 1 mg via ORAL
  Filled 2011-07-29 (×4): qty 1

## 2011-07-29 MED ORDER — HYDROCODONE-ACETAMINOPHEN 5-325 MG PO TABS
1.0000 | ORAL_TABLET | ORAL | Status: DC | PRN
  Administered 2011-07-29 – 2011-07-30 (×2): 1 via ORAL
  Administered 2011-07-30: 2 via ORAL
  Administered 2011-07-30 – 2011-07-31 (×2): 1 via ORAL
  Administered 2011-07-31: 2 via ORAL
  Administered 2011-07-31: 1 via ORAL
  Administered 2011-08-01 (×2): 2 via ORAL
  Filled 2011-07-29: qty 1
  Filled 2011-07-29: qty 2
  Filled 2011-07-29: qty 1
  Filled 2011-07-29 (×3): qty 2
  Filled 2011-07-29 (×3): qty 1

## 2011-07-29 MED ORDER — TIMOLOL MALEATE 0.5 % OP SOLN
1.0000 [drp] | Freq: Two times a day (BID) | OPHTHALMIC | Status: DC
  Filled 2011-07-29: qty 5

## 2011-07-29 MED ORDER — ALLOPURINOL 300 MG PO TABS
300.0000 mg | ORAL_TABLET | Freq: Every day | ORAL | Status: DC
  Administered 2011-07-29 – 2011-08-02 (×5): 300 mg via ORAL
  Filled 2011-07-29 (×5): qty 1

## 2011-07-29 MED ORDER — OSELTAMIVIR PHOSPHATE 75 MG PO CAPS
75.0000 mg | ORAL_CAPSULE | Freq: Once | ORAL | Status: AC
  Administered 2011-07-29: 75 mg via ORAL
  Filled 2011-07-29: qty 1

## 2011-07-29 MED ORDER — DIVALPROEX SODIUM ER 250 MG PO TB24
250.0000 mg | ORAL_TABLET | Freq: Every day | ORAL | Status: DC
  Administered 2011-07-29 – 2011-08-02 (×5): 250 mg via ORAL
  Filled 2011-07-29 (×5): qty 1

## 2011-07-29 NOTE — H&P (Signed)
PCP:   Pearla Dubonnet, MD, MD   Chief Complaint:  Generalized weakness, fever and dehydration  HPI: 70 year old male with a past medical history significant for congestive heart failure he, hypertension, diabetes, history of CVA, coronary artery disease status post CABG and peripheral artery disease; who came into the hospital secondary to generalized weakness, fever/chills and dehydration. Patient reports that he was doing okay until about 2 days proclamation that he start feeling weak. According to the wife patient was just able to drink and there fluid throughout the day prior to admission but with decrease by mouth intake. He was alert but a little lethargic, and complaining of her left lower extremity to be tender. Patient was brought to the emergency department for further evaluation and treatment; he was found to be febrile (temperature of 103.6), severe leukocytosis (WBCs up to 27,000), acute kidney injury; elevated lactic acid and also elevated pro-calcitonin. Patient blood pressure was a stable. A chest x-ray demonstrated just mild CHF changes without acute infiltrates; an x-ray of the left lower strandy show no acute bony abnormalities, but demonstrated soft tissue swelling. [facility] has been called to admit Mr. Dayton for further evaluation and treatment.   Allergies:  No Known Allergies    Past Medical History  Diagnosis Date  . CHF (congestive heart failure)   . Hypertension   . Diabetes mellitus   . CVA (cerebral infarction)   . Epilepsy   . Hx of CABG   . Peripheral vascular disease   . Ulcer     left foot  . Cellulitis   . Chronic kidney disease   . Dyslipidemia   . Chronic venous insufficiency   . Sleep apnea   . Morbid obesity   . Diverticulosis   . Benign prostatic hypertrophy   . Glaucoma   . Staphylococcus aureus infection   . Gout   . Depression   . Insomnia   . Reflux     Past Surgical History  Procedure Date  . Knee surgery    bilateral  . Aortogram w/ angioplasty 05/07/11    Prior to Admission medications   Medication Sig Start Date End Date Taking? Authorizing Provider  allopurinol (ZYLOPRIM) 300 MG tablet Take 300 mg by mouth daily.     Yes Historical Provider, MD  ALPRAZolam Prudy Feeler) 1 MG tablet Take 1 mg by mouth 2 (two) times daily as needed. 1 bid, 2 at hs   Yes Historical Provider, MD  atenolol (TENORMIN) 50 MG tablet Take 50 mg by mouth 2 (two) times daily.     Yes Historical Provider, MD  brimonidine-timolol (COMBIGAN) 0.2-0.5 % ophthalmic solution Place 1 drop into both eyes every 12 (twelve) hours.     Yes Historical Provider, MD  clopidogrel (PLAVIX) 75 MG tablet Take 75 mg by mouth daily.     Yes Historical Provider, MD  divalproex (DEPAKOTE) 250 MG 24 hr tablet Take 250 mg by mouth daily.     Yes Historical Provider, MD  furosemide (LASIX) 80 MG tablet Take 80 mg by mouth 2 (two) times daily.     Yes Historical Provider, MD  glipiZIDE (GLUCOTROL) 5 MG tablet Take 5 mg by mouth 2 (two) times daily before a meal.     Yes Historical Provider, MD  HYDROcodone-acetaminophen (VICODIN ES) 7.5-750 MG per tablet Take 1 tablet by mouth every 6 (six) hours as needed.     Yes Historical Provider, MD  insulin glargine (LANTUS) 100 UNIT/ML injection Inject 30 Units into the skin  at bedtime.     Yes Historical Provider, MD  latanoprost (XALATAN) 0.005 % ophthalmic solution Place 1 drop into both eyes at bedtime.     Yes Historical Provider, MD  metolazone (ZAROXOLYN) 5 MG tablet Take 5 mg by mouth 2 (two) times a week.     Yes Historical Provider, MD  nitroGLYCERIN (NITROSTAT) 0.4 MG SL tablet Place 0.4 mg under the tongue every 5 (five) minutes as needed.     Yes Historical Provider, MD  olopatadine (PATANOL) 0.1 % ophthalmic solution Place 1 drop into both eyes 2 (two) times daily.     Yes Historical Provider, MD  omeprazole (PRILOSEC) 20 MG capsule Take 20 mg by mouth 2 (two) times daily.     Yes Historical Provider,  MD  simvastatin (ZOCOR) 20 MG tablet Take 20 mg by mouth at bedtime.     Yes Historical Provider, MD  temazepam (RESTORIL) 30 MG capsule Take 30 mg by mouth at bedtime as needed.     Yes Historical Provider, MD  terazosin (HYTRIN) 5 MG capsule Take 5 mg by mouth at bedtime.     Yes Historical Provider, MD  timolol (BETIMOL) 0.5 % ophthalmic solution Place 1 drop into the left eye 2 (two) times daily.     Yes Historical Provider, MD    Social History:  reports that he has quit smoking. He has never used smokeless tobacco. He reports that he does not drink alcohol or use illicit drugs.  Family History  Problem Relation Age of Onset  . Hypertension Other   . Cancer Other     Review of Systems:  A complete review of system has been done, and negative except as mentioned on history of present illness.   Physical Exam: Blood pressure 158/63, pulse 86, temperature 102.4 F (39.1 C), temperature source Rectal, resp. rate 30, height 5\' 10"  (1.778 m), weight 127.007 kg (280 lb), SpO2 98.00%. Gen.: Alert awake and oriented x3, in mild distress and tachypneic.   HEENT:  Head: Normocephalic and atraumatic.  Ears: Tympanic membranes without erythema or bulging bilaterally.  Eyes: Conjunctivae are normal. Pupils are equal, round, and reactive to light. Extraocular muscles intact. No icterus or nystagmus.  Neck: Neck supple. No thyromegaly Cardiovascular: Normal rate, regular rhythm and normal heart sounds. Exam reveals no gallop and no friction rub. sternotomy scar appreciated. Pulmonary/Chest: Fair air movement bilaterally, no crackles, no wheezing. Scattered rhonchi. Tachypneic.  Abdominal: Obese, soft, nontender or distended; positive bowel sounds Extremities: Bilateral lower strandy edema 1-2+, left ulcer in the plantar surface without drainage; erythema and tenderness along the anterior left shin (findings suggesting cellulitis).   Lymphadenopathy: He has no cervical adenopathy.    Neurological: He is alert, awake and oriented x3, mild lethargy. He exhibits normal muscle tone. Cranial nerve 2-12 grossly intact, muscle strength 4-5 bilaterally symmetrically secondary to poor effort, no focal deficit. Skin: Skin is warm and clammy.   Labs on Admission:  Results for orders placed during the hospital encounter of 07/29/11 (from the past 48 hour(s))  CBC     Status: Abnormal   Collection Time   07/29/11  5:00 AM      Component Value Range Comment   WBC 27.8 (*) 4.0 - 10.5 (K/uL)    RBC 5.19  4.22 - 5.81 (MIL/uL)    Hemoglobin 15.8  13.0 - 17.0 (g/dL)    HCT 40.9  81.1 - 91.4 (%)    MCV 94.4  78.0 - 100.0 (fL)  MCH 30.4  26.0 - 34.0 (pg)    MCHC 32.2  30.0 - 36.0 (g/dL)    RDW 16.1  09.6 - 04.5 (%)    Platelets 238  150 - 400 (K/uL)   DIFFERENTIAL     Status: Abnormal   Collection Time   07/29/11  5:00 AM      Component Value Range Comment   Neutrophils Relative 85 (*) 43 - 77 (%)    Lymphocytes Relative 10 (*) 12 - 46 (%)    Monocytes Relative 5  3 - 12 (%)    Eosinophils Relative 0  0 - 5 (%)    Basophils Relative 0  0 - 1 (%)    Neutro Abs 23.6 (*) 1.7 - 7.7 (K/uL)    Lymphs Abs 2.8  0.7 - 4.0 (K/uL)    Monocytes Absolute 1.4 (*) 0.1 - 1.0 (K/uL)    Eosinophils Absolute 0.0  0.0 - 0.7 (K/uL)    Basophils Absolute 0.0  0.0 - 0.1 (K/uL)    WBC Morphology WHITE COUNT CONFIRMED ON SMEAR     COMPREHENSIVE METABOLIC PANEL     Status: Abnormal   Collection Time   07/29/11  5:00 AM      Component Value Range Comment   Sodium 140  135 - 145 (mEq/L)    Potassium 5.6 (*) 3.5 - 5.1 (mEq/L)    Chloride 101  96 - 112 (mEq/L)    CO2 28  19 - 32 (mEq/L)    Glucose, Bld 166 (*) 70 - 99 (mg/dL)    BUN 32 (*) 6 - 23 (mg/dL)    Creatinine, Ser 4.09 (*) 0.50 - 1.35 (mg/dL)    Calcium 81.1  8.4 - 10.5 (mg/dL)    Total Protein 7.9  6.0 - 8.3 (g/dL)    Albumin 3.6  3.5 - 5.2 (g/dL)    AST 12  0 - 37 (U/L)    ALT 8  0 - 53 (U/L)    Alkaline Phosphatase 112  39 - 117  (U/L)    Total Bilirubin 0.5  0.3 - 1.2 (mg/dL)    GFR calc non Af Amer 44 (*) >90 (mL/min)    GFR calc Af Amer 51 (*) >90 (mL/min)   LACTIC ACID, PLASMA     Status: Abnormal   Collection Time   07/29/11  5:00 AM      Component Value Range Comment   Lactic Acid, Venous 3.1 (*) 0.5 - 2.2 (mmol/L)   PROCALCITONIN     Status: Normal   Collection Time   07/29/11  5:00 AM      Component Value Range Comment   Procalcitonin 23.83     SAMPLE TO BLOOD BANK     Status: Normal   Collection Time   07/29/11  5:00 AM      Component Value Range Comment   Blood Bank Specimen SAMPLE AVAILABLE FOR TESTING      Sample Expiration 08/01/2011     URINALYSIS, ROUTINE W REFLEX MICROSCOPIC     Status: Abnormal   Collection Time   07/29/11  5:28 AM      Component Value Range Comment   Color, Urine YELLOW  YELLOW     APPearance CLEAR  CLEAR     Specific Gravity, Urine 1.017  1.005 - 1.030     pH 7.5  5.0 - 8.0     Glucose, UA NEGATIVE  NEGATIVE (mg/dL)    Hgb urine dipstick TRACE (*) NEGATIVE  Bilirubin Urine NEGATIVE  NEGATIVE     Ketones, ur NEGATIVE  NEGATIVE (mg/dL)    Protein, ur 454 (*) NEGATIVE (mg/dL)    Urobilinogen, UA 2.0 (*) 0.0 - 1.0 (mg/dL)    Nitrite NEGATIVE  NEGATIVE     Leukocytes, UA NEGATIVE  NEGATIVE    URINE MICROSCOPIC-ADD ON     Status: Abnormal   Collection Time   07/29/11  5:28 AM      Component Value Range Comment   Squamous Epithelial / LPF RARE  RARE     WBC, UA 3-6  <3 (WBC/hpf)    RBC / HPF 3-6  <3 (RBC/hpf)    Bacteria, UA RARE  RARE     Casts HYALINE CASTS (*) NEGATIVE     Urine-Other MUCOUS PRESENT       Radiological Exams on Admission: Dg Chest Port 1 View  07/29/2011  *RADIOLOGY REPORT*  Clinical Data: Fever, weakness, shortness of breath.  PORTABLE CHEST - 1 VIEW  Comparison: 11/14/2010  Findings: Cardiomegaly.  Diffuse interstitial opacities, likely interstitial edema.  Bibasilar atelectasis, similar to prior study. Probable small layering effusions.   Prior CABG.  IMPRESSION: Mild CHF, not significantly changed.  Original Report Authenticated By: Cyndie Chime, M.D.     Assessment/Plan 1-SIRS (systemic inflammatory response syndrome): Most likely secondary to left lower extremity cellulitis; at this point patient has been admitted to step down, started on birth spectrum antibiotics through his veins,; blood cultures, urine culture, and influenza PCR has been in order and pending at this point. Agent with history of MRSA infection in the past, will place him on contact and droplet isolation. We'll provide IV fluid resuscitation (gently watching his I's and O's closely due to history of CHF). Further evaluation and treatment depending on patient's evolution.  2-Lactic acidosis: Patient with history of PAD and left lower strandy cellulitis with open wound. Will provide fluid resuscitation and if needed perform arterial Dopplers.  3 AKI (acute kidney injury): Medications will be adjusted for acute kidney injury, provide fluid resuscitation and will follow renal function trend.  4-Fever: Will use antipyretics as needed. Most likely cause of patient's fever is his left lower strandy cellulitis. Other consideration into the differential diagnosis is also influenza. Will place patient on droplets isolation and check for influenza by PCR.  5-CHF (congestive heart failure): strict I's and O's and daily weight; patient no complaining of any chest pain and his chest x-ray is no demonstrating significant vascular congestion or signs of  fluid overload. Hold metolazone, continue Lasix but just 40 mg by mouth daily while acute kidney injury is present; follow a low-sodium diet.  6-DM (diabetes mellitus): Will start patient on sliding scale insulin and Lantus; will check a hemoglobin A1c in order car modified diet.  7-HTN (hypertension): Stable, continue current medication regimen with dose adjusted for acute kidney injury.  8-PAD (peripheral artery disease):  Pulses reduce in his lower strandy is (left more than right) at this point no signs of necrosis. Provide fluid resuscitation and depending patient evolution perform arterial Dopplers and carb content Dr. Myra Gianotti if needed.  9-Anxiety: continue PRN xanax.  10-Glaucoma: continue eye drops.  11-HLD (hyperlipidemia): Continue statins.  12-BPH (benign prostatic hyperplasia):Continue Hytrin  13-DVT prophylaxis: Lovenox.  14-GERD: Continue PPI (while in the hospital will use Protonix).  The rest of the patient's medical problems remains stable and no changes has been done to his medication regimen; that is to continue same outpatient therapy.  Disposition: Patient will be admitted  to step down and is going to be follow by Dr. Marden Noble on 07/30/2011.   Time Spent on Admission: 60 minutes  Anistyn Graddy [facility] [phone]  07/29/2011, 9:22 AM

## 2011-07-29 NOTE — ED Notes (Signed)
Patient transported to X-ray 

## 2011-07-29 NOTE — ED Notes (Signed)
Pt returned from xray

## 2011-07-29 NOTE — Progress Notes (Signed)
ANTIBIOTIC CONSULT NOTE - INITIAL  Pharmacy Consult for Vancomycin + Cefepime Indication: SIRS (LLE cellulitis), diabetic ulcer  No Known Allergies  Patient Measurements: Height: 5\' 10"  (177.8 cm) Weight: 280 lb (127.007 kg) IBW/kg (Calculated) : 73   Vital Signs: Temp: 101.2 F (38.4 C) (12/24 0948) Temp src: Oral (12/24 0948) BP: 141/55 mmHg (12/24 0948) Pulse Rate: 86  (12/24 0807) Intake/Output from previous day:   Intake/Output from this shift:    Labs:  Basename 07/29/11 0500  WBC 27.8*  HGB 15.8  PLT 238  LABCREA --  CREATININE 1.55*   Estimated Creatinine Clearance: 60.2 ml/min (by C-G formula based on Cr of 1.55). No results found for this basename: VANCOTROUGH:2,VANCOPEAK:2,VANCORANDOM:2,GENTTROUGH:2,GENTPEAK:2,GENTRANDOM:2,TOBRATROUGH:2,TOBRAPEAK:2,TOBRARND:2,AMIKACINPEAK:2,AMIKACINTROU:2,AMIKACIN:2, in the last 72 hours   Microbiology: No results found for this or any previous visit (from the past 720 hour(s)).  Medical History: Past Medical History  Diagnosis Date  . CHF (congestive heart failure)   . Hypertension   . Diabetes mellitus   . CVA (cerebral infarction)   . Epilepsy   . Hx of CABG   . Peripheral vascular disease   . Ulcer     left foot  . Cellulitis   . Chronic kidney disease   . Dyslipidemia   . Chronic venous insufficiency   . Sleep apnea   . Morbid obesity   . Diverticulosis   . Benign prostatic hypertrophy   . Glaucoma   . Staphylococcus aureus infection   . Gout   . Depression   . Insomnia   . Reflux    Medications:  Scheduled:    . acetaminophen  975 mg Rectal Once  . allopurinol  300 mg Oral Daily  . atenolol  50 mg Oral BID  . brimonidine-timolol  1 drop Both Eyes Q12H  . ceFEPime (MAXIPIME) IV  1 g Intravenous Q12H  . clopidogrel  75 mg Oral Daily  . divalproex  250 mg Oral Daily  . enoxaparin  40 mg Subcutaneous Q24H  . furosemide  40 mg Oral Daily  . ibuprofen  400 mg Oral Once  . insulin aspart  0-5  Units Subcutaneous QHS  . insulin aspart  0-9 Units Subcutaneous TID WC  . insulin glargine  5 Units Subcutaneous QHS  . latanoprost  1 drop Both Eyes QHS  . metronidazole  500 mg Intravenous Q8H  . metronidazole  500 mg Intravenous Q8H  . olopatadine  1 drop Both Eyes BID  . oseltamivir  75 mg Oral Once  . pantoprazole  40 mg Oral Q1200  . simvastatin  20 mg Oral QHS  . sodium chloride  1,000 mL Intravenous Once  . sodium chloride  2,000 mL Intravenous Once  . terazosin  5 mg Oral QHS  . timolol  1 drop Left Eye BID  . vancomycin  1,000 mg Intravenous Once   Infusions:    . sodium chloride     Assessment:  70 YOM with sepsis, diabetic L foot ulcer, LLE cellulitis for pharmacy to start Vanc/Cefepime empirically.  Tmax 103.5, WBC 27.8k, PCT 23.83 this AM, Wt 127 kg  CKD with Scr of 1.6, normalized CrCl 44 ml/min  Pending blcx, ucx, influenza PCR  Patient is currently on Cefepime 1gm IV q12h, 2 orders of Flagyl 500 mg IV q8h, and received Vancomycin 1gm IV x 1 at 0800 this AM.  H/o MRSA infection  Goal of Therapy:  Vancomycin trough level 15-20 mcg/ml  Plan:   Continue Cefepime 1gm IV q12h  Vancomycin 1.5 gm x  1 for a total of 2.5 gm loading dose then Vancomycin 2gm IV q24h.  D/C 1 order of Flagyl per scope of practice given duplication.  F/u renal fxn and adjust vanc as needed  Geoffry Paradise Thi 07/29/2011,10:00 AM

## 2011-07-29 NOTE — ED Notes (Signed)
Pressure ulcer on bottom of left foot. Unstageable, circular with eschar in center of ulcer.

## 2011-07-29 NOTE — ED Notes (Signed)
Report given to Kim,RN in ICU. WIll tx upstairs to [id number].

## 2011-07-29 NOTE — ED Notes (Signed)
MD at bedside. 

## 2011-07-29 NOTE — ED Notes (Signed)
Per dr order pt is allowed to drink water and have fluids. Will wait for admitted dr to place diet orders.

## 2011-07-29 NOTE — ED Notes (Signed)
Sacrum pressure ulcer dry with no drainage or bleeding. Pressure ulcer noted and already in documentation. Deep and wide covering middle of sacrum. No dressing applied at this time.

## 2011-07-29 NOTE — Plan of Care (Signed)
Problem: Consults Goal: Skin Care Protocol Initiated - if indicated If consults are not indicated, leave blank or document N/A Outcome: Progressing Pt needs WOC consult

## 2011-07-29 NOTE — ED Provider Notes (Signed)
History    70yM with cc of weakness. Pt slid from chair to floor because very weak. Subjective fever. Nausea and vomiting. No diarrhea. No CP. Feels SOB. C/o R knee pain which seems to be chronic in nature. Denies acute trauma.  Ulcer to L foot. Multiple medical problems.  CSN: [id number]  Arrival date & time 07/29/11  0408   First MD Initiated Contact with Patient 07/29/11 [phone]      Chief Complaint  Patient presents with  . Weakness    (Consider location/radiation/quality/duration/timing/severity/associated sxs/prior treatment) HPI  Past Medical History  Diagnosis Date  . CHF (congestive heart failure)   . Hypertension   . Diabetes mellitus   . CVA (cerebral infarction)   . Epilepsy   . Hx of CABG   . Peripheral vascular disease   . Ulcer     left foot  . Cellulitis   . Chronic kidney disease   . Dyslipidemia   . Chronic venous insufficiency   . Sleep apnea   . Morbid obesity   . Diverticulosis   . Benign prostatic hypertrophy   . Glaucoma   . Staphylococcus aureus infection   . Gout   . Depression   . Insomnia   . Reflux     Past Surgical History  Procedure Date  . Knee surgery     bilateral  . Aortogram w/ angioplasty 05/07/11    Family History  Problem Relation Age of Onset  . Hypertension Other   . Cancer Other     History  Substance Use Topics  . Smoking status: Former Games developer  . Smokeless tobacco: Never Used  . Alcohol Use: No     occasionallly      Review of Systems   Review of symptoms negative unless otherwise noted in HPI.   Allergies  Review of patient's allergies indicates no known allergies.  Home Medications   Current Outpatient Rx  Name Route Sig Dispense Refill  . ALLOPURINOL 300 MG PO TABS Oral Take 300 mg by mouth daily.      Marland Kitchen ALPRAZOLAM 1 MG PO TABS Oral Take 1 mg by mouth 2 (two) times daily as needed. 1 bid, 2 at hs    . ATENOLOL 50 MG PO TABS Oral Take 50 mg by mouth 2 (two) times daily.      Marland Kitchen BRIMONIDINE  TARTRATE-TIMOLOL 0.2-0.5 % OP SOLN Both Eyes Place 1 drop into both eyes every 12 (twelve) hours.      . CLOPIDOGREL BISULFATE 75 MG PO TABS Oral Take 75 mg by mouth daily.      Marland Kitchen DIVALPROEX SODIUM ER 250 MG PO TB24 Oral Take 250 mg by mouth daily.      . FUROSEMIDE 80 MG PO TABS Oral Take 80 mg by mouth 2 (two) times daily.      Marland Kitchen GLIPIZIDE 5 MG PO TABS Oral Take 5 mg by mouth 2 (two) times daily before a meal.      . HYDROCODONE-ACETAMINOPHEN 7.5-750 MG PO TABS Oral Take 1 tablet by mouth every 6 (six) hours as needed.      . INSULIN GLARGINE 100 UNIT/ML [place] SOLN Subcutaneous Inject 30 Units into the skin at bedtime.      Marland Kitchen LATANOPROST 0.005 % OP SOLN Both Eyes Place 1 drop into both eyes at bedtime.      Marland Kitchen METOLAZONE 5 MG PO TABS Oral Take 5 mg by mouth 2 (two) times a week.      Marland Kitchen NITROGLYCERIN 0.4 MG  SL SUBL Sublingual Place 0.4 mg under the tongue every 5 (five) minutes as needed.      . OLOPATADINE HCL 0.1 % OP SOLN Both Eyes Place 1 drop into both eyes 2 (two) times daily.      Marland Kitchen OMEPRAZOLE 20 MG PO CPDR Oral Take 20 mg by mouth 2 (two) times daily.      Marland Kitchen SIMVASTATIN 20 MG PO TABS Oral Take 20 mg by mouth at bedtime.      Marland Kitchen TEMAZEPAM 30 MG PO CAPS Oral Take 30 mg by mouth at bedtime as needed.      . TERAZOSIN HCL 5 MG PO CAPS Oral Take 5 mg by mouth at bedtime.      Marland Kitchen TIMOLOL HEMIHYDRATE 0.5 % OP SOLN Left Eye Place 1 drop into the left eye 2 (two) times daily.        BP 159/37  Pulse 79  Temp(Src) 100.8 F (38.2 C) (Oral)  Resp 30  Ht 5\' 10"  (1.778 m)  Wt 280 lb (127.007 kg)  BMI 40.18 kg/m2  SpO2 100%  Physical Exam  Nursing note and vitals reviewed. Constitutional: He appears distressed.       Laying in bed. Breathing with mouth open. Diaphoretic. Morbidly obese.  HENT:  Head: Normocephalic and atraumatic.       Mucus membranes dry  Eyes: Conjunctivae are normal. Pupils are equal, round, and reactive to light. Right eye exhibits no discharge. Left eye exhibits no  discharge.  Neck: Neck supple.  Cardiovascular: Normal rate, regular rhythm and normal heart sounds.  Exam reveals no gallop and no friction rub.        sternotomy scar  Pulmonary/Chest:       Tachypneic. Decreased breath sounds b/l  Abdominal: Soft. He exhibits no distension. There is no tenderness.  Musculoskeletal:       B/l LE edema. Ulcer to plantar surface of L foot. Erythema and tenderness along anterior L shin concerning for cellulitis. R knee with well healed surgical scars. Deformity which appears chronic. Could not appreciate effusion but exam limited by body habitus. NO significant increase of pain with ROM.  Lymphadenopathy:    He has no cervical adenopathy.  Neurological: He is alert. He exhibits normal muscle tone.  Skin: Skin is warm. He is diaphoretic.  Psychiatric: He has a normal mood and affect. His behavior is normal. Thought content normal.    ED Course  Procedures (including critical care time)  CRITICAL CARE Performed by: Raeford Razor   Total critical care time: 35 minutes  Critical care time was exclusive of separately billable procedures and treating other patients.  Critical care was necessary to treat or prevent imminent or life-threatening deterioration.  Critical care was time spent personally by me on the following activities: development of treatment plan with patient and/or surrogate as well as nursing, discussions with consultants, evaluation of patient's response to treatment, examination of patient, obtaining history from patient or surrogate, ordering and performing treatments and interventions, ordering and review of laboratory studies, ordering and review of radiographic studies, pulse oximetry and re-evaluation of patient's condition.   Labs Reviewed  CBC - Abnormal; Notable for the following:    WBC 27.8 (*)    All other components within normal limits  DIFFERENTIAL - Abnormal; Notable for the following:    Neutrophils Relative 85 (*)      Lymphocytes Relative 10 (*)    Neutro Abs 23.6 (*)    Monocytes Absolute 1.4 (*)  All other components within normal limits  COMPREHENSIVE METABOLIC PANEL - Abnormal; Notable for the following:    Potassium 5.6 (*)    Glucose, Bld 166 (*)    BUN 32 (*)    Creatinine, Ser 1.55 (*)    GFR calc non Af Amer 44 (*)    GFR calc Af Amer 51 (*)    All other components within normal limits  LACTIC ACID, PLASMA - Abnormal; Notable for the following:    Lactic Acid, Venous 3.1 (*)    All other components within normal limits  URINALYSIS, ROUTINE W REFLEX MICROSCOPIC - Abnormal; Notable for the following:    Hgb urine dipstick TRACE (*)    Protein, ur 100 (*)    Urobilinogen, UA 2.0 (*)    All other components within normal limits  URINE MICROSCOPIC-ADD ON - Abnormal; Notable for the following:    Casts HYALINE CASTS (*)    All other components within normal limits  PROCALCITONIN  SAMPLE TO BLOOD BANK  CULTURE, BLOOD (ROUTINE X 2)  CULTURE, BLOOD (ROUTINE X 2)  URINE CULTURE  BASIC METABOLIC PANEL  INFLUENZA PANEL BY PCR   Dg Chest Port 1 View  07/29/2011  *RADIOLOGY REPORT*  Clinical Data: Fever, weakness, shortness of breath.  PORTABLE CHEST - 1 VIEW  Comparison: 11/14/2010  Findings: Cardiomegaly.  Diffuse interstitial opacities, likely interstitial edema.  Bibasilar atelectasis, similar to prior study. Probable small layering effusions.  Prior CABG.  IMPRESSION: Mild CHF, not significantly changed.  Original Report Authenticated By: Cyndie Chime, M.D.    EKG:  Rhythm: normal sinus Rate: 86 Axis: Left Intervals: bifasicular block ST segments: NS ST changes  8:24 AM Pt rexamined. No new complaints. Remains febrile. More antipyretics ordered. IVF infusing and abx infusing. Discussed with hospitalist. Admit to stepdown.    1. Severe sepsis   2. Fever   3. Cellulitis   4. Ulcer of foot   5. Dehydration   6. Acute renal insufficiency       MDM  70yM with fever.   Severe sepsis with SIRS, source and evidence of inadequate perfusion.  Has cellulitis LLE. Also consider other sources such as L foot wound. C/o R knee pain but doubt septic joint with no increase of pain with ROM. CXR without focal infiltrate. UA not suggestive. Possible flu. Doubt meningitis. Abdominal exam benign. Empiric broad spectrum abx. IVF resuscitation. Will require admit for further testing and eval. HyperK noted, no EKG changes though so will repeat prior to tx'ing.        Raeford Razor, MD 07/29/11 [id number]

## 2011-07-29 NOTE — ED Notes (Signed)
Per EMS, pt from home.  Pt was sitting in a recliner.  Pt slid into floor from recliner.  Pt sitting on couch on arrival.  Pt has had increased weakness, poor appetite, vomiting (per wife, vomits regularly without illness). No trauma noted in transport.  Pt is alert and oriented.  Vs. 174/92, 90, cbg 157,  HX DM, bp, cholesterol, stroke

## 2011-07-30 DIAGNOSIS — L03116 Cellulitis of left lower limb: Secondary | ICD-10-CM

## 2011-07-30 LAB — CBC
Hemoglobin: 12.5 g/dL — ABNORMAL LOW (ref 13.0–17.0)
MCH: 30.6 pg (ref 26.0–34.0)
MCV: 94.4 fL (ref 78.0–100.0)
RBC: 4.09 MIL/uL — ABNORMAL LOW (ref 4.22–5.81)
WBC: 14.8 10*3/uL — ABNORMAL HIGH (ref 4.0–10.5)

## 2011-07-30 LAB — URINE CULTURE
Colony Count: NO GROWTH
Culture  Setup Time: 201212241225
Culture: NO GROWTH

## 2011-07-30 LAB — BASIC METABOLIC PANEL
CO2: 25 mEq/L (ref 19–32)
Calcium: 8.6 mg/dL (ref 8.4–10.5)
Chloride: 103 mEq/L (ref 96–112)
Glucose, Bld: 105 mg/dL — ABNORMAL HIGH (ref 70–99)
Sodium: 138 mEq/L (ref 135–145)

## 2011-07-30 LAB — GLUCOSE, CAPILLARY: Glucose-Capillary: 82 mg/dL (ref 70–99)

## 2011-07-30 MED ORDER — SODIUM CHLORIDE 0.9 % IV SOLN
INTRAVENOUS | Status: DC
  Administered 2011-07-30 – 2011-07-31 (×2): via INTRAVENOUS

## 2011-07-30 NOTE — Progress Notes (Signed)
Patient ID: Nicholas [name], male   DOB: September 28, 1940, 70 y.o.   MRN: [id number] Subjective: Pt feel better, c/o some left leg pain Episode of syncope at home,  Dehydration, cellulitis Cr improved,   Objective: Vital signs in last 24 hours: Temp:  [97.8 F (36.6 C)-101.2 F (38.4 C)] 98.9 F (37.2 C) (12/25 0400) Pulse Rate:  [63-81] 72  (12/25 0800) Resp:  [15-25] 15  (12/25 0800) BP: (108-159)/(34-93) 117/39 mmHg (12/25 0800) SpO2:  [96 %-100 %] 100 % (12/25 0800) FiO2 (%):  [2 %] 2 % (12/24 1835) Weight:  [128.6 kg (283 lb 8.2 oz)-129.5 kg (285 lb 7.9 oz)] 285 lb 7.9 oz (129.5 kg) (12/25 0500)   Intake/Output from previous day: 12/24 0701 - 12/25 0700 In: 1225 [I.V.:975; IV Piggyback:250] Out: 770 [Urine:770]    General appearance: alert Resp: clear to auscultation bilaterally Cardio: regular rate and rhythm, S1, S2 normal, no murmur, click, rub or gallop GI: soft, non-tender; bowel sounds normal; no masses,  no organomegaly Skin: left lower leg cellulitis with warm and reddness Lab Results:  Basename 07/30/11 0312 07/29/11 1009  WBC 14.8* 22.1*  HGB 12.5* 13.7  HCT 38.6* 42.7  PLT 175 210   BMET  Basename 07/30/11 0312 07/29/11 1009  NA 138 140  K 4.1 4.3  CL 103 105  CO2 25 24  GLUCOSE 105* 152*  BUN 25* 30*  CREATININE 1.18 1.36*  CALCIUM 8.6 9.0   Lab Results  Component Value Date   ALT 8 07/29/2011   AST 12 07/29/2011   ALKPHOS 112 07/29/2011   BILITOT 0.5 07/29/2011    Assessment/Plan:  Principal Problem:  *SIRS (systemic inflammatory response syndrome) Improved; continue IVF Active Problems:  Lactic acidosis imporved  AKI (acute kidney injury) Cr improved, lasix on hold  Dec IVF 50 cc  Fever Cellulitis Continue IV ABX  CHF (congestive heart failure) stable  DM (diabetes mellitus) SS insulin  HTN (hypertension)  PAD (peripheral artery disease)  Anxiety  Glaucoma  HLD (hyperlipidemia)  BPH (benign prostatic hyperplasia)  Cellulitis Left leg ; on ABX   Nicholas [name] 07/30/2011, 8:10 AM

## 2011-07-30 NOTE — Progress Notes (Signed)
CRITICAL VALUE ALERT   Critical value received:  Positive blood cultures, Gram positive cocci in clusters  Date of notification:  07-30-2011  Time of notification:  1300  Critical value read back:yes  Nurse who received alert:  Neysa Bonito  MD notified (1st page):  Dr. Eula Listen    Time of first page:  19:50  MD notified (2nd page):  Time of second page:  Responding MD:  Dr. Eula Listen   Time MD responded:  19:51

## 2011-07-31 LAB — BASIC METABOLIC PANEL
BUN: 19 mg/dL (ref 6–23)
CO2: 28 mEq/L (ref 19–32)
Calcium: 8.9 mg/dL (ref 8.4–10.5)
Creatinine, Ser: 1.12 mg/dL (ref 0.50–1.35)
GFR calc non Af Amer: 65 mL/min — ABNORMAL LOW (ref >90)
Glucose, Bld: 128 mg/dL — ABNORMAL HIGH (ref 70–99)

## 2011-07-31 LAB — CULTURE, BLOOD (ROUTINE X 2): Culture  Setup Time: 201212241001

## 2011-07-31 LAB — CBC
HCT: 38.1 % — ABNORMAL LOW (ref 39.0–52.0)
Hemoglobin: 11.7 g/dL — ABNORMAL LOW (ref 13.0–17.0)
MCH: 29.3 pg (ref 26.0–34.0)
MCHC: 30.7 g/dL (ref 30.0–36.0)
MCV: 95.3 fL (ref 78.0–100.0)
RBC: 4 MIL/uL — ABNORMAL LOW (ref 4.22–5.81)

## 2011-07-31 LAB — GLUCOSE, CAPILLARY
Glucose-Capillary: 142 mg/dL — ABNORMAL HIGH (ref 70–99)
Glucose-Capillary: 170 mg/dL — ABNORMAL HIGH (ref 70–99)

## 2011-07-31 MED ORDER — VANCOMYCIN HCL 1000 MG IV SOLR
1250.0000 mg | Freq: Two times a day (BID) | INTRAVENOUS | Status: DC
  Administered 2011-07-31 – 2011-08-01 (×2): 1250 mg via INTRAVENOUS
  Filled 2011-07-31 (×3): qty 1250

## 2011-07-31 NOTE — Progress Notes (Signed)
ANTIBIOTIC CONSULT NOTE - Follow Up  Pharmacy Consult for Vancomycin + Cefepime Indication: SIRS (LLE cellulitis), diabetic ulcer  No Known Allergies  Patient Measurements: Height: 5\' 10"  (177.8 cm) Weight: 287 lb 14.7 oz (130.6 kg) IBW/kg (Calculated) : 73   Vital Signs: Temp: 98.4 F (36.9 C) (12/26 0535) Temp src: Oral (12/26 0535) BP: 135/59 mmHg (12/26 0636) Pulse Rate: 72  (12/26 0535) Intake/Output from previous day: 12/25 0701 - 12/26 0700 In: 2075 [P.O.:300; I.V.:1175; IV Piggyback:600] Out: 2050 [Urine:2050] Intake/Output from this shift:    Labs:  Basename 07/31/11 0448 07/30/11 0312 07/29/11 1009  WBC 9.7 14.8* 22.1*  HGB 11.7* 12.5* 13.7  PLT 169 175 210  LABCREA -- -- --  CREATININE 1.12 1.18 1.36*   Estimated Creatinine Clearance: 84.5 ml/min (by C-G formula based on Cr of 1.12).  Microbiology: Recent Results (from the past 720 hour(s))  CULTURE, BLOOD (ROUTINE X 2)     Status: Normal (Preliminary result)   Collection Time   07/29/11  4:49 AM      Component Value Range Status Comment   Specimen Description BLOOD RIGHT [facility]   Final    Special Requests BOTTLES DRAWN AEROBIC ONLY [facility]   Final    Setup Time [id number]   Final    Culture     Final    Value: GRAM POSITIVE COCCI IN CLUSTERS     Note: Gram Stain Report Called to,Read Back By and Verified With: RN C. SYKES ON 07/30/11 AT 1208 BY TEDAR   Report Status PENDING   Incomplete   CULTURE, BLOOD (ROUTINE X 2)     Status: Normal (Preliminary result)   Collection Time   07/29/11  4:55 AM      Component Value Range Status Comment   Specimen Description BLOOD LEFT ANTECUBITAL   Final    Special Requests BOTTLES DRAWN AEROBIC AND ANAEROBIC 10CC   Final    Setup Time [id number]   Final    Culture     Final    Value:        BLOOD CULTURE RECEIVED NO GROWTH TO DATE CULTURE WILL BE HELD FOR 5 DAYS BEFORE ISSUING A FINAL NEGATIVE REPORT   Report Status PENDING   Incomplete   URINE CULTURE      Status: Normal   Collection Time   07/29/11  5:28 AM      Component Value Range Status Comment   Specimen Description URINE, CLEAN CATCH   Final    Special Requests NONE   Final    Setup Time [id number]   Final    Colony Count NO GROWTH   Final    Culture NO GROWTH   Final    Report Status 07/30/2011 FINAL   Final   MRSA PCR SCREENING     Status: Abnormal   Collection Time   07/29/11  6:33 PM      Component Value Range Status Comment   MRSA by PCR POSITIVE (*) NEGATIVE  Final    Assessment:  70YOM on day #3 vancomycin 2g q24h and cefepime 1g IV q12h for empiric treatment of sepsis, diabetic L foot ulcer, LLE cellulitis in obese patient with hx of MRSA infection.  BCx 1/2: GPC in clusters  AF, WBC now wnl, 12/24 PCT 23.83  SCr improving, now 1.12 --> CrCl (Normalized)~ 21ml/min   Goal of Therapy:  Vancomycin trough level 15-20 mcg/ml  Plan:   Continue Cefepime 1gm IV q12h  Adjust Vancomycin to 1250mg  IV q12h. Obtain  level in next 1-2 days.  F/u renal fxn for further dose adjustments. F/u culture results.  Clance Boll 07/31/2011,10:07 AM

## 2011-07-31 NOTE — Progress Notes (Signed)
Subjective: Feeling much better. Able to ambulate to the bathroom last night. One blood culture positive for gram-positive cocci Objective: Weight change: 2 kg (4 lb 6.6 oz)  Intake/Output Summary (Last 24 hours) at 07/31/11 0740 Last data filed at 07/31/11 1610  Gross per 24 hour  Intake   1675 ml  Output   2050 ml  Net   -375 ml    General Appearance: Alert, cooperative, no distress, appears stated age Head: Normocephalic, without obvious abnormality, atraumatic Neck: Supple, symmetrical Lungs: Clear to auscultation bilaterally, respirations unlabored Heart: Regular rate and rhythm, S1 and S2 normal, no murmur, rub or gallop Abdomen: Soft, non-tender, bowel sounds active all four quadrants, no masses, no organomegaly Buttocks: Small chronic decubitus and marked deformity from prior decubitus ulcer Extremities: 2+ edema in both lower extremities. Marked hypertrophy and deformity of the right knee. Left foot arch with dime-sized superficial ulcer with clean base Pulses: 1+ and symmetric all extremities Skin: Skin color, texture, turgor normal, no rashes or lesions except for lower extremities. Left lower extremity with erythema up to the knee Neuro: CNII-XII intact. Normal strength, sensation and reflexes throughout   Lab Results:  Basename 07/31/11 0448 07/30/11 0312 07/29/11 1009  NA 135 138 --  K 4.0 4.1 --  CL 102 103 --  CO2 28 25 --  GLUCOSE 128* 105* --  BUN 19 25* --  CREATININE 1.12 1.18 --  CALCIUM 8.9 8.6 --  MG -- -- 1.5  PHOS -- -- 2.1*    Basename 07/29/11 0500  AST 12  ALT 8  ALKPHOS 112  BILITOT 0.5  PROT 7.9  ALBUMIN 3.6   No results found for this basename: LIPASE:2,AMYLASE:2 in the last 72 hours  Basename 07/31/11 0448 07/30/11 0312 07/29/11 0500  WBC 9.7 14.8* --  NEUTROABS -- -- 23.6*  HGB 11.7* 12.5* --  HCT 38.1* 38.6* --  MCV 95.3 94.4 --  PLT 169 175 --   No results found for this basename: CKTOTAL:3,CKMB:3,CKMBINDEX:3,TROPONINI:3 in  the last 72 hours No components found with this basename: POCBNP:3 No results found for this basename: DDIMER:2 in the last 72 hours  Basename 07/29/11 1009  HGBA1C 7.0*   No results found for this basename: CHOL:2,HDL:2,LDLCALC:2,TRIG:2,CHOLHDL:2,LDLDIRECT:2 in the last 72 hours  Basename 07/29/11 1009  TSH 0.690  T4TOTAL --  T3FREE --  THYROIDAB --   No results found for this basename: VITAMINB12:2,FOLATE:2,FERRITIN:2,TIBC:2,IRON:2,RETICCTPCT:2 in the last 72 hours  Studies/Results: No results found. Medications: Scheduled Meds:   . allopurinol  300 mg Oral Daily  . antiseptic oral rinse  15 mL Mouth Rinse BID  . atenolol  50 mg Oral BID  . brimonidine  1 drop Both Eyes Q12H  . ceFEPime (MAXIPIME) IV  1 g Intravenous Q12H  . Chlorhexidine Gluconate Cloth  6 each Topical Q0600  . clopidogrel  75 mg Oral Daily  . divalproex  250 mg Oral Daily  . enoxaparin (LOVENOX) injection  60 mg Subcutaneous Daily  . furosemide  40 mg Oral Daily  . insulin aspart  0-5 Units Subcutaneous QHS  . insulin aspart  0-9 Units Subcutaneous TID WC  . insulin glargine  5 Units Subcutaneous QHS  . latanoprost  1 drop Both Eyes QHS  . mupirocin ointment  1 application Nasal BID  . olopatadine  1 drop Both Eyes BID  . pantoprazole  40 mg Oral Q1200  . simvastatin  20 mg Oral QHS  . terazosin  5 mg Oral QHS  . timolol  1 drop Left Eye BID  . vancomycin  2,000 mg Intravenous Q24H  . DISCONTD: metronidazole  500 mg Intravenous Q8H   Continuous Infusions:   . sodium chloride 50 mL/hr at 07/31/11 0638   PRN Meds:.acetaminophen, acetaminophen, ALPRAZolam, HYDROcodone-acetaminophen, morphine, nitroGLYCERIN, ondansetron (ZOFRAN) IV, ondansetron, polyethylene glycol, temazepam  Assessment/Plan: Patient Active Problem List  Diagnoses Date Noted  . Cellulitis - left lower extremity still with fairly intense redness but improved. One blood culture positive for gram-positive cocci  07/30/2011  . SIRS  (systemic inflammatory response syndrome) - symptomatically improved  07/29/2011  . Lactic acidosis - , acidosis resolved  07/29/2011  . AKI (acute kidney injury) - creatinine improving  07/29/2011  . Fever - resolved  07/29/2011  . CHF (congestive heart failure) - controlled  07/29/2011  . DM (diabetes mellitus) - controlled  07/29/2011  . HTN (hypertension) - controlled  07/29/2011  . PAD (peripheral artery disease) - we'll check lower extremity arterial Dopplers prior to discharge  07/29/2011  . Anxiety - controlled  07/29/2011  . Glaucoma baseboard at aware and on medications  07/29/2011  . HLD (hyperlipidemia) 07/29/2011  . BPH (benign prostatic hyperplasia) 07/29/2011  . Atherosclerosis of native arteries of the extremities with ulceration 04/29/2011  .  left foot ulcer and chronic decubitus - wound consult  08/24/2009     LOS: 2 days   Nicholas [name] NEVILL 07/31/2011, 7:40 AM

## 2011-07-31 NOTE — Consult Note (Signed)
WOC consult Note Reason for Consult: req to eval pt for wound care for sacral pressure ulcer and DM foot ulcer of left foot.  Pts CG reports sacral pressure ulcer very large and deep at one time, noted that based on deformity of sacral region this area is a healing Stage III, was at some time a full thickness ulceration. CG reports pt does not spend a lot of time in the bed and is ambulatory some at home.  Also has left plantar surface DM ulceration with noted Charcot foot deformity.  Was treated at wound care center for some time for serial debridements but currently primary care practitioner managing this ulceration.  Wears some type of DM foot wear but not offloading boot of any type, has had one in the past.   CG reports that this area will heal and reopen from time to time.   Wound type: 1. Sacral pressure ulcer-Stage III (healing)   2.  DM foot ulcer-left plantar surface  Pressure Ulcer POA: Yes x 1  Measurement:1.  Sacrum   2 open areas with skin bridge between 1.5cm x 2.0 cm x 0.2 cm and 1.0cm x 1.0 cm x     0.2cm    2.  L plantar foot - 1.0 cm x 1.5 cm x 0.5cm with undermining noted circumferentially that is 1.0 cm   Wound bed: 1. Sacrum pale pink minimal granulation tissue present  2.  L plantar foot-pale with some yellow slough < 10%  Drainage (amount, consistency, odor) 1.  Sacrum moderate-yellow-green with no odor  2.  L plantar foot moderate yellow with no odor  Periwound: 1. Sacrum- large area of healed/scar tissue and maceration  2.  Hyperkeratotic skin- would benefit from serial debridement of this area.   Dressing procedure/placement/frequency:  Stann Mainland order silver hydrofiber to both wounds to control bioburden and drainage, as well to aid with less maceration of sacral periwound.  Re consult if needed, will not follow at this time. Thanks  Fredrica Capano [employer], CWOCN [phone])

## 2011-08-01 LAB — BASIC METABOLIC PANEL
BUN: 15 mg/dL (ref 6–23)
Chloride: 105 mEq/L (ref 96–112)
GFR calc Af Amer: >90 mL/min (ref >90)
GFR calc non Af Amer: 82 mL/min — ABNORMAL LOW (ref >90)
Potassium: 3.6 mEq/L (ref 3.5–5.1)
Sodium: 140 mEq/L (ref 135–145)

## 2011-08-01 LAB — CBC
Hemoglobin: 11.6 g/dL — ABNORMAL LOW (ref 13.0–17.0)
MCH: 29.5 pg (ref 26.0–34.0)
MCHC: 31.2 g/dL (ref 30.0–36.0)
Platelets: 193 10*3/uL (ref 150–400)
RDW: 14.5 % (ref 11.5–15.5)

## 2011-08-01 LAB — DIFFERENTIAL
Basophils Absolute: 0 10*3/uL (ref 0.0–0.1)
Basophils Relative: 0 % (ref 0–1)
Eosinophils Absolute: 0.3 10*3/uL (ref 0.0–0.7)
Neutro Abs: 4.6 10*3/uL (ref 1.7–7.7)
Neutrophils Relative %: 59 % (ref 43–77)

## 2011-08-01 LAB — GLUCOSE, CAPILLARY
Glucose-Capillary: 151 mg/dL — ABNORMAL HIGH (ref 70–99)
Glucose-Capillary: 135 mg/dL — ABNORMAL HIGH (ref 70–99)

## 2011-08-01 LAB — PRO B NATRIURETIC PEPTIDE: Pro B Natriuretic peptide (BNP): 2046 pg/mL — ABNORMAL HIGH (ref 0–125)

## 2011-08-01 MED ORDER — SULFAMETHOXAZOLE-TMP DS 800-160 MG PO TABS
1.0000 | ORAL_TABLET | Freq: Two times a day (BID) | ORAL | Status: DC
  Administered 2011-08-01 – 2011-08-02 (×3): 1 via ORAL
  Filled 2011-08-01 (×5): qty 1

## 2011-08-01 MED ORDER — RIFAMPIN 300 MG PO CAPS
300.0000 mg | ORAL_CAPSULE | Freq: Two times a day (BID) | ORAL | Status: DC
  Administered 2011-08-01 – 2011-08-02 (×3): 300 mg via ORAL
  Filled 2011-08-01 (×4): qty 1

## 2011-08-01 MED ORDER — FUROSEMIDE 80 MG PO TABS
80.0000 mg | ORAL_TABLET | Freq: Every day | ORAL | Status: DC
  Administered 2011-08-01 – 2011-08-02 (×2): 80 mg via ORAL
  Filled 2011-08-01 (×2): qty 1

## 2011-08-01 NOTE — Progress Notes (Signed)
Subjective: Continues to feel better. Able to ambulate with walker  Objective: Weight change: -3.048 kg (-6 lb 11.5 oz)  Intake/Output Summary (Last 24 hours) at 08/01/11 0805 Last data filed at 08/01/11 0351  Gross per 24 hour  Intake   2220 ml  Output   3650 ml  Net  -1430 ml   General Appearance: Alert, cooperative, no distress, appears stated age  Head: Normocephalic, without obvious abnormality, atraumatic  Neck: Supple, symmetrical  Lungs: Clear to auscultation bilaterally, respirations unlabored  Heart: Regular rate and rhythm, S1 and S2 normal, no murmur, rub or gallop  Abdomen: Soft, non-tender, bowel sounds active all four quadrants, no masses, no organomegaly  Buttocks: Small chronic decubitus and marked deformity from prior decubitus ulcer  Extremities: 2+ edema in both lower extremities. Marked hypertrophy and deformity of the right knee. Left foot arch with dime-sized deep ulcer with clean base. Erythema appears to be better than yesterday Pulses: 1+ and symmetric all extremities  Skin: Skin color, texture, turgor normal, no rashes or lesions except for lower extremities. Left lower extremity with erythema up to the knee  Neuro: CNII-XII intact. Normal strength, sensation and reflexes throughout    Lab Results:  Basename 08/01/11 0436 07/31/11 0448 07/29/11 1009  NA 140 135 --  K 3.6 4.0 --  CL 105 102 --  CO2 28 28 --  GLUCOSE 124* 128* --  BUN 15 19 --  CREATININE 0.98 1.12 --  CALCIUM 8.6 8.9 --  MG -- -- 1.5  PHOS -- -- 2.1*   No results found for this basename: AST:2,ALT:2,ALKPHOS:2,BILITOT:2,PROT:2,ALBUMIN:2 in the last 72 hours No results found for this basename: LIPASE:2,AMYLASE:2 in the last 72 hours  Basename 08/01/11 0436 07/31/11 0448  WBC 7.8 9.7  NEUTROABS 4.6 --  HGB 11.6* 11.7*  HCT 37.2* 38.1*  MCV 94.7 95.3  PLT 193 169   No results found for this basename: CKTOTAL:3,CKMB:3,CKMBINDEX:3,TROPONINI:3 in the last 72 hours No components  found with this basename: POCBNP:3 No results found for this basename: DDIMER:2 in the last 72 hours  Basename 07/29/11 1009  HGBA1C 7.0*   No results found for this basename: CHOL:2,HDL:2,LDLCALC:2,TRIG:2,CHOLHDL:2,LDLDIRECT:2 in the last 72 hours  Basename 07/29/11 1009  TSH 0.690  T4TOTAL --  T3FREE --  THYROIDAB --   No results found for this basename: VITAMINB12:2,FOLATE:2,FERRITIN:2,TIBC:2,IRON:2,RETICCTPCT:2 in the last 72 hours  Studies/Results: No results found. Medications: Scheduled Meds:   . allopurinol  300 mg Oral Daily  . antiseptic oral rinse  15 mL Mouth Rinse BID  . atenolol  50 mg Oral BID  . brimonidine  1 drop Both Eyes Q12H  . Chlorhexidine Gluconate Cloth  6 each Topical Q0600  . clopidogrel  75 mg Oral Daily  . divalproex  250 mg Oral Daily  . enoxaparin (LOVENOX) injection  60 mg Subcutaneous Daily  . furosemide  80 mg Oral Daily  . insulin aspart  0-5 Units Subcutaneous QHS  . insulin aspart  0-9 Units Subcutaneous TID WC  . insulin glargine  5 Units Subcutaneous QHS  . latanoprost  1 drop Both Eyes QHS  . mupirocin ointment  1 application Nasal BID  . olopatadine  1 drop Both Eyes BID  . pantoprazole  40 mg Oral Q1200  . rifampin  300 mg Oral Q12H  . simvastatin  20 mg Oral QHS  . sulfamethoxazole-trimethoprim  1 tablet Oral Q12H  . terazosin  5 mg Oral QHS  . timolol  1 drop Left Eye BID  .  DISCONTD: ceFEPime (MAXIPIME) IV  1 g Intravenous Q12H  . DISCONTD: furosemide  40 mg Oral Daily  . DISCONTD: vancomycin  1,250 mg Intravenous Q12H  . DISCONTD: vancomycin  2,000 mg Intravenous Q24H   Continuous Infusions:  PRN Meds:.acetaminophen, acetaminophen, ALPRAZolam, HYDROcodone-acetaminophen, morphine, nitroGLYCERIN, ondansetron (ZOFRAN) IV, ondansetron, polyethylene glycol, temazepam  Assessment/Plan: Patient Active Problem List  Cellulitis - left lower extremity still with fairly intense redness but improved. One blood culture positive for  gram-positive cocci. Symptomatically much improved and erythema is improved. Ulcer base is clean with the use of aqua cell. Switch over to Bactrim double strength and rifampin by mouth and discontinue cefepime and vancomycin 07/30/2011  .  SIRS (systemic inflammatory response syndrome) - symptomatically resolved 07/29/2011  .  Lactic acidosis - , acidosis resolved  07/29/2011  .  AKI (acute kidney injury) - creatinine improved 07/29/2011  .  Fever - resolved  07/29/2011  .  CHF (congestive heart failure) - controlled  07/29/2011  .  DM (diabetes mellitus) - controlled  07/29/2011  .  HTN (hypertension) - moderately elevated today. Will increase Lasix 07/29/2011  .  PAD (peripheral artery disease) - we'll check lower extremity arterial Dopplers prior to discharge  07/29/2011  .  Anxiety - controlled  07/29/2011  .  Glaucoma baseboard at aware and on medications  07/29/2011  .  HLD (hyperlipidemia)  07/29/2011  .  BPH (benign prostatic hyperplasia)  07/29/2011  .  Atherosclerosis of native arteries of the extremities with ulceration  04/29/2011  .  left foot ulcer and chronic decubitus - wound consult completed. Aquacel being used in ulcer base 08/24/2009  Possible discharge in a.m.       LOS: 3 days   Nicholas [name] 08/01/2011, 8:05 AM

## 2011-08-01 NOTE — Progress Notes (Signed)
Physical Therapy Evaluation Patient Details Name: Nicholas [name] MRN: [id number] DOB: [birth year] Today's Date: 08/01/2011  [id number] EVII, [place]  Problem List:  Patient Active Problem List  Diagnoses  . UNSPECIFIED INTESTINAL HELMINTHIASIS  . Atherosclerosis of native arteries of the extremities with ulceration  . SIRS (systemic inflammatory response syndrome)  . Lactic acidosis  . AKI (acute kidney injury)  . Fever  . CHF (congestive heart failure)  . DM (diabetes mellitus)  . HTN (hypertension)  . PAD (peripheral artery disease)  . Anxiety  . Glaucoma  . HLD (hyperlipidemia)  . BPH (benign prostatic hyperplasia)  . Cellulitis    Past Medical History:  Past Medical History  Diagnosis Date  . CHF (congestive heart failure)   . Hypertension   . Diabetes mellitus   . CVA (cerebral infarction)   . Epilepsy   . Hx of CABG   . Peripheral vascular disease   . Ulcer     left foot  . Cellulitis   . Chronic kidney disease   . Dyslipidemia   . Chronic venous insufficiency   . Sleep apnea   . Morbid obesity   . Diverticulosis   . Benign prostatic hypertrophy   . Glaucoma   . Staphylococcus aureus infection   . Gout   . Depression   . Insomnia   . Reflux   . Coronary artery disease    Past Surgical History:  Past Surgical History  Procedure Date  . Knee surgery     bilateral  . Aortogram w/ angioplasty 05/07/11  . Coronary artery bypass graft     x7 vessels    PT Assessment/Plan/Recommendation PT Assessment Clinical Impression Statement: Pt presents with systemic inflammatory response syndrome with diabetic ulcer on L foot and sacral wound.  PT educated on importance of foot care with diabetes and importance of controlling diabetes.  Also discussed how nutrition benefits wound healing due to pt stating he has not eaten very much.  Wife is there to assist 24/7 at home and he does not wish to go to SNF.  PT recommends HHPT at D/C for further rehab PT  Recommendation/Assessment: Patient will need skilled PT in the acute care venue PT Problem List: Decreased strength;Decreased range of motion;Decreased activity tolerance;Decreased balance;Decreased mobility;Impaired sensation;Decreased skin integrity;Obesity;Decreased knowledge of use of DME Barriers to Discharge: None PT Therapy Diagnosis : Difficulty walking;Abnormality of gait;Generalized weakness PT Plan PT Frequency: Min 3X/week PT Treatment/Interventions: Gait training;DME instruction;Stair training;Functional mobility training;Therapeutic activities;Therapeutic exercise;Patient/family education PT Recommendation Follow Up Recommendations: Home health PT Equipment Recommended: None recommended by PT PT Goals  Acute Rehab PT Goals PT Goal Formulation: With patient Time For Goal Achievement: 2 weeks Pt will go Supine/Side to Sit: with supervision PT Goal: Supine/Side to Sit - Progress: Not met Pt will go Sit to Supine/Side: with supervision PT Goal: Sit to Supine/Side - Progress: Not met Pt will go Sit to Stand: with supervision PT Goal: Sit to Stand - Progress: Not met Pt will go Stand to Sit: with supervision PT Goal: Stand to Sit - Progress: Not met Pt will Ambulate: 51 - 150 feet;with supervision;with least restrictive assistive device PT Goal: Ambulate - Progress: Not met Pt will Go Up / Down Stairs: 1-2 stairs;with supervision;with least restrictive assistive device PT Goal: Up/Down Stairs - Progress: Not met Pt will Perform Home Exercise Program: with supervision, verbal cues required/provided PT Goal: Perform Home Exercise Program - Progress: Not met  PT Evaluation Precautions/Restrictions  Restrictions Weight Bearing Restrictions: No Prior Functioning  Home Living Lives With: Spouse Receives Help From: Family Type of Home: Apartment Home Layout: One level (Lives in first floor apt) Home Access: Stairs to enter Entrance Stairs-Rails: Right Entrance Stairs-Number  of Steps: 1 Home Adaptive Equipment: Walker - rolling;Wheelchair - manual Additional Comments: Lift chair Prior Function Level of Independence: Independent with gait;Requires assistive device for independence;Needs assistance with ADLs Bath: Minimal Dressing: Minimal Grooming: Minimal Driving: No Vocation: Unemployed Cognition Cognition Arousal/Alertness: Awake/alert Overall Cognitive Status: Appears within functional limits for tasks assessed Orientation Level: Oriented X4 Sensation/Coordination Sensation Light Touch: Impaired Detail Light Touch Impaired Details: Absent RLE;Absent LLE Extremity Assessment RLE Strength RLE Overall Strength Comments: RLE grossly 3/5 per functional assessment with ankle DF/PF 2-/5, knee ext 2-/5  LLE Strength LLE Overall Strength Comments: LLE grossly 3/5 per functional assessment and ankle DF/PF 3/5 with knee ext 3+/5 Mobility (including Balance) Bed Mobility Bed Mobility: Yes Supine to Sit: 4: Min assist Supine to Sit Details (indicate cue type and reason): Required some assist to lower LE off bed.  Cues given for hand placement to assist therapist. Sitting - Scoot to Edge of Bed: 4: Min assist Sitting - Scoot to Edge of Bed Details (indicate cue type and reason): Required some assist to get to EOB Transfers Transfers: Yes Sit to Stand: 4: Min assist Sit to Stand Details (indicate cue type and reason): Required some assist to attain upright position with cues for hand placement to push up from bed to RW Stand to Sit: 4: Min assist Stand to Sit Details: Required cues for hand placement on chair before sitting.  Ambulation/Gait Ambulation/Gait: Yes Ambulation/Gait Assistance: 4: Min assist Ambulation/Gait Assistance Details (indicate cue type and reason): Required Min A for securing RW with cues on technique and sequencing of RW  Ambulation Distance (Feet): 50 Feet Assistive device: Rolling walker Gait Pattern: Step-through pattern;Decreased  step length - right;Decreased step length - left Gait velocity: Decreased gait velocity with mild forward trunk lean Stairs: No Wheelchair Mobility Wheelchair Mobility: No    Exercise    End of Session PT - End of Session Equipment Utilized During Treatment: Gait belt Activity Tolerance: Patient tolerated treatment well Patient left: in chair;with call bell in reach;with family/visitor present General Behavior During Session: [facility] for tasks performed Cognition: [facility] for tasks performed  Page, Meribeth Mattes 08/01/2011, 4:29 PM

## 2011-08-02 LAB — CBC
HCT: 39.9 % (ref 39.0–52.0)
Hemoglobin: 12.5 g/dL — ABNORMAL LOW (ref 13.0–17.0)
MCH: 29.3 pg (ref 26.0–34.0)
RBC: 4.26 MIL/uL (ref 4.22–5.81)

## 2011-08-02 LAB — DIFFERENTIAL
Eosinophils Absolute: 0.2 10*3/uL (ref 0.0–0.7)
Lymphs Abs: 2.4 10*3/uL (ref 0.7–4.0)
Monocytes Absolute: 0.7 10*3/uL (ref 0.1–1.0)
Monocytes Relative: 10 % (ref 3–12)
Neutrophils Relative %: 56 % (ref 43–77)

## 2011-08-02 LAB — BASIC METABOLIC PANEL
BUN: 11 mg/dL (ref 6–23)
Chloride: 102 mEq/L (ref 96–112)
Creatinine, Ser: 0.92 mg/dL (ref 0.50–1.35)
Glucose, Bld: 104 mg/dL — ABNORMAL HIGH (ref 70–99)
Potassium: 3.3 mEq/L — ABNORMAL LOW (ref 3.5–5.1)

## 2011-08-02 LAB — GLUCOSE, CAPILLARY: Glucose-Capillary: 130 mg/dL — ABNORMAL HIGH (ref 70–99)

## 2011-08-02 MED ORDER — SULFAMETHOXAZOLE-TMP DS 800-160 MG PO TABS: 1.0000 | ORAL_TABLET | Freq: Two times a day (BID) | ORAL | Status: AC

## 2011-08-02 MED ORDER — RIFAMPIN 300 MG PO CAPS: 300.0000 mg | ORAL_CAPSULE | Freq: Two times a day (BID) | ORAL | Status: AC

## 2011-08-02 NOTE — Discharge Summary (Signed)
Physician Discharge Summary  NAME:Nicholas [name]  ZOX:[id number]  DOB: August 07, 1940   Admit date: 07/29/2011 Discharge date: 08/02/2011  Discharge Diagnoses:  Principal Problem:  *SIRS (systemic inflammatory response syndrome) - resolved Active Problems:  AKI (acute kidney injury) - resolved  Fever - resolved  CHF (congestive heart failure) - symptomatically controlled  DM (diabetes mellitus) - controlled  HTN (hypertension) - controlled  PAD (peripheral artery disease) - stable  Anxiety - stable  Glaucoma - stable  HLD (hyperlipidemia) - aware  BPH (benign prostatic hyperplasia) - symptomatically control  Cellulitis - left lower extremity - improving  Left foot arch ulcer - will need continuing care from home health nursing and the wound clinic with daily dressing changes   Discharge Condition: Improved  Hospital Course:   [name] is a very pleasant 70 year old male with multiple medical issues. He has a history of recurrent lower extremity cellulitis and now has a recurrent left foot arch ulcer which will need outpatient care. He was admitted with fever and hypotension initially which improved with intravenous hydration and intravenous antibiotics. He had one blood culture positive for coag-negative staph. Will discharge on MRSA covering antibiotics for 10 more days of therapy. He has remained afebrile on oral Bactrim and rifampin for 24 hours. Discharge diet has severe DJD of the right knee and requires a walker for ambulation. He has been ambulating to the bathroom safely during the last couple of days of his hospitalization. Recommend continued physical therapy and occupational therapy and wound care as an outpatient. Other chronic medical conditions were stable. Renal insufficiency on admission resolved  Consults:  Wound care Disposition: Discharged home with home health and wound clinic followup  Discharge Orders    Future Appointments: Provider: Department: Dept  Phone: Center:   09/17/2011 12:00 PM [facility] [phone] [facility]   09/17/2011 12:30 PM [facility] [phone] [facility]   06/22/2012 11:45 AM V Durene Cal, MD [facility] [phone] [facility]     Future Orders Please Complete By Expires   Ambulatory referral to Wound Clinic      Comments:   Needs wound clinic referral within one week   Ambulatory referral to Home Health      Comments:   Please evaluate Nicholas [name] for admission to Home Health. Patient needs wound care at home. Patient is homebound. Left foot arch ulcer will need therapy 3 times a week and patient will also need a wound clinic referral. Please consult with patient and his wife about which home care organization they would like to use     Physician to follow patient's care (the person listed here will be responsible for signing ongoing orders): Dr. Pearla Dubonnet  Requested Start of Care Date: December 29th, 2012  Special Instructions:  Patient needs wound care daily which wife can help provide but will need to supervision by her health and the wound clinic   Diet - low sodium heart healthy      Increase activity slowly      Discharge instructions      Comments:   Increase activity slowly. Use walker at all times. Contact M.D. if left lower extremity becomes more red and sore or if fever occurs greater than 100F   Discharge wound care:      Comments:   Continue left foot arch ulcer care that was initiated in the hospital with management by home health and the wound clinic   Change dressing (specify)  Comments:   Dressing change: Change daily     Current Discharge Medication List    START taking these medications   Details  rifampin (RIFADIN) 300 MG capsule Take 1 capsule (300 mg total) by mouth every 12 (twelve) hours. Qty: 20 capsule, Refills: 0    sulfamethoxazole-trimethoprim (BACTRIM DS) 800-160 MG per tablet Take 1 tablet by mouth every 12 (twelve)  hours. Qty: 20 tablet, Refills: 0      CONTINUE these medications which have NOT CHANGED   Details  allopurinol (ZYLOPRIM) 300 MG tablet Take 300 mg by mouth daily.      ALPRAZolam (XANAX) 1 MG tablet Take 1 mg by mouth 2 (two) times daily as needed. 1 bid, 2 at hs    atenolol (TENORMIN) 50 MG tablet Take 50 mg by mouth 2 (two) times daily.      brimonidine-timolol (COMBIGAN) 0.2-0.5 % ophthalmic solution Place 1 drop into both eyes every 12 (twelve) hours.      clopidogrel (PLAVIX) 75 MG tablet Take 75 mg by mouth daily.      divalproex (DEPAKOTE) 250 MG 24 hr tablet Take 250 mg by mouth daily.      furosemide (LASIX) 80 MG tablet Take 80 mg by mouth 2 (two) times daily.      glipiZIDE (GLUCOTROL) 5 MG tablet Take 5 mg by mouth 2 (two) times daily before a meal.      HYDROcodone-acetaminophen (VICODIN ES) 7.5-750 MG per tablet Take 1 tablet by mouth every 6 (six) hours as needed.      insulin glargine (LANTUS) 100 UNIT/ML injection Inject 30 Units into the skin at bedtime.      latanoprost (XALATAN) 0.005 % ophthalmic solution Place 1 drop into both eyes at bedtime.      metolazone (ZAROXOLYN) 5 MG tablet Take 5 mg by mouth 2 (two) times a week.      nitroGLYCERIN (NITROSTAT) 0.4 MG SL tablet Place 0.4 mg under the tongue every 5 (five) minutes as needed.      olopatadine (PATANOL) 0.1 % ophthalmic solution Place 1 drop into both eyes 2 (two) times daily.      omeprazole (PRILOSEC) 20 MG capsule Take 20 mg by mouth 2 (two) times daily.      simvastatin (ZOCOR) 20 MG tablet Take 20 mg by mouth at bedtime.      temazepam (RESTORIL) 30 MG capsule Take 30 mg by mouth at bedtime as needed.      terazosin (HYTRIN) 5 MG capsule Take 5 mg by mouth at bedtime.      timolol (BETIMOL) 0.5 % ophthalmic solution Place 1 drop into the left eye 2 (two) times daily.         Follow-up Information    Follow up with Fulton Merry NEVILL, MD .         Things to follow up in the  outpatient setting: Left foot ulcer and lower extremity edema  Time coordinating discharge: 35 minutes  The results of significant diagnostics from this hospitalization (including imaging, microbiology, ancillary and laboratory) are listed below for reference.    Significant Diagnostic Studies: Dg Chest Port 1 View  07/29/2011  *RADIOLOGY REPORT*  Clinical Data: Fever, weakness, shortness of breath.  PORTABLE CHEST - 1 VIEW  Comparison: 11/14/2010  Findings: Cardiomegaly.  Diffuse interstitial opacities, likely interstitial edema.  Bibasilar atelectasis, similar to prior study. Probable small layering effusions.  Prior CABG.  IMPRESSION: Mild CHF, not significantly changed.  Original Report Authenticated By: Cyndie Chime,  M.D.    Microbiology: Recent Results (from the past 240 hour(s))  CULTURE, BLOOD (ROUTINE X 2)     Status: Normal   Collection Time   07/29/11  4:49 AM      Component Value Range Status Comment   Specimen Description BLOOD RIGHT [facility] [place]   Final    Special Requests BOTTLES DRAWN AEROBIC ONLY 7CC   Final    Setup Time [id number]   Final    Culture     Final    Value: STAPHYLOCOCCUS SPECIES (COAGULASE NEGATIVE)     Note: THE SIGNIFICANCE OF ISOLATING THIS ORGANISM FROM A SINGLE SET OF BLOOD CULTURES WHEN MULTIPLE SETS ARE DRAWN IS UNCERTAIN. PLEASE NOTIFY THE MICROBIOLOGY DEPARTMENT WITHIN ONE WEEK IF SPECIATION AND SENSITIVITIES ARE REQUIRED.     Note: Gram Stain Report Called to,Read Back By and Verified With: RN C. SYKES ON 07/30/11 AT 1208 BY TEDAR   Report Status 07/31/2011 FINAL   Final   CULTURE, BLOOD (ROUTINE X 2)     Status: Normal (Preliminary result)   Collection Time   07/29/11  4:55 AM      Component Value Range Status Comment   Specimen Description BLOOD LEFT ANTECUBITAL   Final    Special Requests BOTTLES DRAWN AEROBIC AND ANAEROBIC 10CC   Final    Setup Time [id number]   Final    Culture     Final    Value:        BLOOD CULTURE RECEIVED NO  GROWTH TO DATE CULTURE WILL BE HELD FOR 5 DAYS BEFORE ISSUING A FINAL NEGATIVE REPORT   Report Status PENDING   Incomplete   URINE CULTURE     Status: Normal   Collection Time   07/29/11  5:28 AM      Component Value Range Status Comment   Specimen Description URINE, CLEAN CATCH   Final    Special Requests NONE   Final    Setup Time [id number]   Final    Colony Count NO GROWTH   Final    Culture NO GROWTH   Final    Report Status 07/30/2011 FINAL   Final   MRSA PCR SCREENING     Status: Abnormal   Collection Time   07/29/11  6:33 PM      Component Value Range Status Comment   MRSA by PCR POSITIVE (*) NEGATIVE  Final      Labs: Results for orders placed during the hospital encounter of 07/29/11  CULTURE, BLOOD (ROUTINE X 2)      Component Value Range   Specimen Description BLOOD RIGHT FOREEARM     Special Requests BOTTLES DRAWN AEROBIC ONLY 7CC     Setup Time [id number]     Culture       Value: STAPHYLOCOCCUS SPECIES (COAGULASE NEGATIVE)     Note: THE SIGNIFICANCE OF ISOLATING THIS ORGANISM FROM A SINGLE SET OF BLOOD CULTURES WHEN MULTIPLE SETS ARE DRAWN IS UNCERTAIN. PLEASE NOTIFY THE MICROBIOLOGY DEPARTMENT WITHIN ONE WEEK IF SPECIATION AND SENSITIVITIES ARE REQUIRED.     Note: Gram Stain Report Called to,Read Back By and Verified With: RN C. SYKES ON 07/30/11 AT 1208 BY TEDAR   Report Status 07/31/2011 FINAL    CULTURE, BLOOD (ROUTINE X 2)      Component Value Range   Specimen Description BLOOD LEFT ANTECUBITAL     Special Requests BOTTLES DRAWN AEROBIC AND ANAEROBIC 10CC     Setup Time [id number]     Culture  Value:        BLOOD CULTURE RECEIVED NO GROWTH TO DATE CULTURE WILL BE HELD FOR 5 DAYS BEFORE ISSUING A FINAL NEGATIVE REPORT   Report Status PENDING    CBC      Component Value Range   WBC 27.8 (*) 4.0 - 10.5 (K/uL)   RBC 5.19  4.22 - 5.81 (MIL/uL)   Hemoglobin 15.8  13.0 - 17.0 (g/dL)   HCT 16.1  09.6 - 04.5 (%)   MCV 94.4  78.0 - 100.0 (fL)   MCH  30.4  26.0 - 34.0 (pg)   MCHC 32.2  30.0 - 36.0 (g/dL)   RDW 40.9  81.1 - 91.4 (%)   Platelets 238  150 - 400 (K/uL)  DIFFERENTIAL      Component Value Range   Neutrophils Relative 85 (*) 43 - 77 (%)   Lymphocytes Relative 10 (*) 12 - 46 (%)   Monocytes Relative 5  3 - 12 (%)   Eosinophils Relative 0  0 - 5 (%)   Basophils Relative 0  0 - 1 (%)   Neutro Abs 23.6 (*) 1.7 - 7.7 (K/uL)   Lymphs Abs 2.8  0.7 - 4.0 (K/uL)   Monocytes Absolute 1.4 (*) 0.1 - 1.0 (K/uL)   Eosinophils Absolute 0.0  0.0 - 0.7 (K/uL)   Basophils Absolute 0.0  0.0 - 0.1 (K/uL)   WBC Morphology WHITE COUNT CONFIRMED ON SMEAR    COMPREHENSIVE METABOLIC PANEL      Component Value Range   Sodium 140  135 - 145 (mEq/L)   Potassium 5.6 (*) 3.5 - 5.1 (mEq/L)   Chloride 101  96 - 112 (mEq/L)   CO2 28  19 - 32 (mEq/L)   Glucose, Bld 166 (*) 70 - 99 (mg/dL)   BUN 32 (*) 6 - 23 (mg/dL)   Creatinine, Ser 7.82 (*) 0.50 - 1.35 (mg/dL)   Calcium 95.6  8.4 - 10.5 (mg/dL)   Total Protein 7.9  6.0 - 8.3 (g/dL)   Albumin 3.6  3.5 - 5.2 (g/dL)   AST 12  0 - 37 (U/L)   ALT 8  0 - 53 (U/L)   Alkaline Phosphatase 112  39 - 117 (U/L)   Total Bilirubin 0.5  0.3 - 1.2 (mg/dL)   GFR calc non Af Amer 44 (*) >90 (mL/min)   GFR calc Af Amer 51 (*) >90 (mL/min)  LACTIC ACID, PLASMA      Component Value Range   Lactic Acid, Venous 3.1 (*) 0.5 - 2.2 (mmol/L)  PROCALCITONIN      Component Value Range   Procalcitonin 23.83    URINALYSIS, ROUTINE W REFLEX MICROSCOPIC      Component Value Range   Color, Urine YELLOW  YELLOW    APPearance CLEAR  CLEAR    Specific Gravity, Urine 1.017  1.005 - 1.030    pH 7.5  5.0 - 8.0    Glucose, UA NEGATIVE  NEGATIVE (mg/dL)   Hgb urine dipstick TRACE (*) NEGATIVE    Bilirubin Urine NEGATIVE  NEGATIVE    Ketones, ur NEGATIVE  NEGATIVE (mg/dL)   Protein, ur 213 (*) NEGATIVE (mg/dL)   Urobilinogen, UA 2.0 (*) 0.0 - 1.0 (mg/dL)   Nitrite NEGATIVE  NEGATIVE    Leukocytes, UA NEGATIVE  NEGATIVE    URINE CULTURE      Component Value Range   Specimen Description URINE, CLEAN CATCH     Special Requests NONE     Setup Time [id number]  Colony Count NO GROWTH     Culture NO GROWTH     Report Status 07/30/2011 FINAL    SAMPLE TO BLOOD BANK      Component Value Range   Blood Bank Specimen SAMPLE AVAILABLE FOR TESTING     Sample Expiration 08/01/2011    URINE MICROSCOPIC-ADD ON      Component Value Range   Squamous Epithelial / LPF RARE  RARE    WBC, UA 3-6  <3 (WBC/hpf)   RBC / HPF 3-6  <3 (RBC/hpf)   Bacteria, UA RARE  RARE    Casts HYALINE CASTS (*) NEGATIVE    Urine-Other MUCOUS PRESENT    BASIC METABOLIC PANEL      Component Value Range   Sodium 140  135 - 145 (mEq/L)   Potassium 4.3  3.5 - 5.1 (mEq/L)   Chloride 105  96 - 112 (mEq/L)   CO2 24  19 - 32 (mEq/L)   Glucose, Bld 152 (*) 70 - 99 (mg/dL)   BUN 30 (*) 6 - 23 (mg/dL)   Creatinine, Ser 4.78 (*) 0.50 - 1.35 (mg/dL)   Calcium 9.0  8.4 - 29.5 (mg/dL)   GFR calc non Af Amer 52 (*) >90 (mL/min)   GFR calc Af Amer 60 (*) >90 (mL/min)  INFLUENZA PANEL BY PCR      Component Value Range   Influenza A By PCR NEGATIVE  NEGATIVE    Influenza B By PCR NEGATIVE  NEGATIVE    H1N1 flu by pcr NOT DETECTED  NOT DETECTED   CBC      Component Value Range   WBC 22.1 (*) 4.0 - 10.5 (K/uL)   RBC 4.54  4.22 - 5.81 (MIL/uL)   Hemoglobin 13.7  13.0 - 17.0 (g/dL)   HCT 62.1  30.8 - 65.7 (%)   MCV 94.1  78.0 - 100.0 (fL)   MCH 30.2  26.0 - 34.0 (pg)   MCHC 32.1  30.0 - 36.0 (g/dL)   RDW 84.6  96.2 - 95.2 (%)   Platelets 210  150 - 400 (K/uL)  MAGNESIUM      Component Value Range   Magnesium 1.5  1.5 - 2.5 (mg/dL)  PHOSPHORUS      Component Value Range   Phosphorus 2.1 (*) 2.3 - 4.6 (mg/dL)  TSH      Component Value Range   TSH 0.690  0.350 - 4.500 (uIU/mL)  APTT      Component Value Range   aPTT 35  24 - 37 (seconds)  PROTIME-INR      Component Value Range   Prothrombin Time 14.7  11.6 - 15.2 (seconds)   INR 1.13   0.00 - 1.49   HEMOGLOBIN A1C      Component Value Range   Hemoglobin A1C 7.0 (*) <5.7 (%)   Mean Plasma Glucose 154 (*) <117 (mg/dL)  GLUCOSE, CAPILLARY      Component Value Range   Glucose-Capillary 135 (*) 70 - 99 (mg/dL)  CBC      Component Value Range   WBC 14.8 (*) 4.0 - 10.5 (K/uL)   RBC 4.09 (*) 4.22 - 5.81 (MIL/uL)   Hemoglobin 12.5 (*) 13.0 - 17.0 (g/dL)   HCT 84.1 (*) 32.4 - 52.0 (%)   MCV 94.4  78.0 - 100.0 (fL)   MCH 30.6  26.0 - 34.0 (pg)   MCHC 32.4  30.0 - 36.0 (g/dL)   RDW 40.1  02.7 - 25.3 (%)   Platelets 175  150 -  400 (K/uL)  BASIC METABOLIC PANEL      Component Value Range   Sodium 138  135 - 145 (mEq/L)   Potassium 4.1  3.5 - 5.1 (mEq/L)   Chloride 103  96 - 112 (mEq/L)   CO2 25  19 - 32 (mEq/L)   Glucose, Bld 105 (*) 70 - 99 (mg/dL)   BUN 25 (*) 6 - 23 (mg/dL)   Creatinine, Ser 2.95  0.50 - 1.35 (mg/dL)   Calcium 8.6  8.4 - 62.1 (mg/dL)   GFR calc non Af Amer 61 (*) >90 (mL/min)   GFR calc Af Amer 71 (*) >90 (mL/min)  MRSA PCR SCREENING      Component Value Range   MRSA by PCR POSITIVE (*) NEGATIVE   GLUCOSE, CAPILLARY      Component Value Range   Glucose-Capillary 64 (*) 70 - 99 (mg/dL)   Comment 1 Documented in Chart     Comment 2 Notify RN    GLUCOSE, CAPILLARY      Component Value Range   Glucose-Capillary 105 (*) 70 - 99 (mg/dL)   Comment 1 Documented in Chart     Comment 2 Notify RN    GLUCOSE, CAPILLARY      Component Value Range   Glucose-Capillary 108 (*) 70 - 99 (mg/dL)  GLUCOSE, CAPILLARY      Component Value Range   Glucose-Capillary 82  70 - 99 (mg/dL)  GLUCOSE, CAPILLARY      Component Value Range   Glucose-Capillary 110 (*) 70 - 99 (mg/dL)  CBC      Component Value Range   WBC 9.7  4.0 - 10.5 (K/uL)   RBC 4.00 (*) 4.22 - 5.81 (MIL/uL)   Hemoglobin 11.7 (*) 13.0 - 17.0 (g/dL)   HCT 30.8 (*) 65.7 - 52.0 (%)   MCV 95.3  78.0 - 100.0 (fL)   MCH 29.3  26.0 - 34.0 (pg)   MCHC 30.7  30.0 - 36.0 (g/dL)   RDW 84.6  96.2 - 95.2  (%)   Platelets 169  150 - 400 (K/uL)  BASIC METABOLIC PANEL      Component Value Range   Sodium 135  135 - 145 (mEq/L)   Potassium 4.0  3.5 - 5.1 (mEq/L)   Chloride 102  96 - 112 (mEq/L)   CO2 28  19 - 32 (mEq/L)   Glucose, Bld 128 (*) 70 - 99 (mg/dL)   BUN 19  6 - 23 (mg/dL)   Creatinine, Ser 8.41  0.50 - 1.35 (mg/dL)   Calcium 8.9  8.4 - 32.4 (mg/dL)   GFR calc non Af Amer 65 (*) >90 (mL/min)   GFR calc Af Amer 76 (*) >90 (mL/min)  GLUCOSE, CAPILLARY      Component Value Range   Glucose-Capillary 154 (*) 70 - 99 (mg/dL)  GLUCOSE, CAPILLARY      Component Value Range   Glucose-Capillary 124 (*) 70 - 99 (mg/dL)  GLUCOSE, CAPILLARY      Component Value Range   Glucose-Capillary 120 (*) 70 - 99 (mg/dL)  GLUCOSE, CAPILLARY      Component Value Range   Glucose-Capillary 171 (*) 70 - 99 (mg/dL)  GLUCOSE, CAPILLARY      Component Value Range   Glucose-Capillary 99  70 - 99 (mg/dL)   Comment 1 Documented in Chart     Comment 2 Notify RN     Comment 3 Orig Pt Id entered as     CBC      Component  Value Range   WBC 7.8  4.0 - 10.5 (K/uL)   RBC 3.93 (*) 4.22 - 5.81 (MIL/uL)   Hemoglobin 11.6 (*) 13.0 - 17.0 (g/dL)   HCT 40.9 (*) 81.1 - 52.0 (%)   MCV 94.7  78.0 - 100.0 (fL)   MCH 29.5  26.0 - 34.0 (pg)   MCHC 31.2  30.0 - 36.0 (g/dL)   RDW 91.4  78.2 - 95.6 (%)   Platelets 193  150 - 400 (K/uL)  DIFFERENTIAL      Component Value Range   Neutrophils Relative 59  43 - 77 (%)   Neutro Abs 4.6  1.7 - 7.7 (K/uL)   Lymphocytes Relative 28  12 - 46 (%)   Lymphs Abs 2.2  0.7 - 4.0 (K/uL)   Monocytes Relative 10  3 - 12 (%)   Monocytes Absolute 0.8  0.1 - 1.0 (K/uL)   Eosinophils Relative 3  0 - 5 (%)   Eosinophils Absolute 0.3  0.0 - 0.7 (K/uL)   Basophils Relative 0  0 - 1 (%)   Basophils Absolute 0.0  0.0 - 0.1 (K/uL)  BASIC METABOLIC PANEL      Component Value Range   Sodium 140  135 - 145 (mEq/L)   Potassium 3.6  3.5 - 5.1 (mEq/L)   Chloride 105  96 - 112 (mEq/L)   CO2  28  19 - 32 (mEq/L)   Glucose, Bld 124 (*) 70 - 99 (mg/dL)   BUN 15  6 - 23 (mg/dL)   Creatinine, Ser 2.13  0.50 - 1.35 (mg/dL)   Calcium 8.6  8.4 - 08.6 (mg/dL)   GFR calc non Af Amer 82 (*) >90 (mL/min)   GFR calc Af Amer >90  >90 (mL/min)  PRO B NATRIURETIC PEPTIDE      Component Value Range   Pro B Natriuretic peptide (BNP) 2046.0 (*) 0 - 125 (pg/mL)  GLUCOSE, CAPILLARY      Component Value Range   Glucose-Capillary 142 (*) 70 - 99 (mg/dL)  GLUCOSE, CAPILLARY      Component Value Range   Glucose-Capillary 170 (*) 70 - 99 (mg/dL)  GLUCOSE, CAPILLARY      Component Value Range   Glucose-Capillary 151 (*) 70 - 99 (mg/dL)   Comment 1 Documented in Chart     Comment 2 Notify RN    GLUCOSE, CAPILLARY      Component Value Range   Glucose-Capillary 135 (*) 70 - 99 (mg/dL)  GLUCOSE, CAPILLARY      Component Value Range   Glucose-Capillary 155 (*) 70 - 99 (mg/dL)  CBC      Component Value Range   WBC 7.6  4.0 - 10.5 (K/uL)   RBC 4.26  4.22 - 5.81 (MIL/uL)   Hemoglobin 12.5 (*) 13.0 - 17.0 (g/dL)   HCT 57.8  46.9 - 62.9 (%)   MCV 93.7  78.0 - 100.0 (fL)   MCH 29.3  26.0 - 34.0 (pg)   MCHC 31.3  30.0 - 36.0 (g/dL)   RDW 52.8  41.3 - 24.4 (%)   Platelets 221  150 - 400 (K/uL)  DIFFERENTIAL      Component Value Range   Neutrophils Relative 56  43 - 77 (%)   Neutro Abs 4.2  1.7 - 7.7 (K/uL)   Lymphocytes Relative 32  12 - 46 (%)   Lymphs Abs 2.4  0.7 - 4.0 (K/uL)   Monocytes Relative 10  3 - 12 (%)   Monocytes Absolute 0.7  0.1 - 1.0 (K/uL)   Eosinophils Relative 3  0 - 5 (%)   Eosinophils Absolute 0.2  0.0 - 0.7 (K/uL)   Basophils Relative 0  0 - 1 (%)   Basophils Absolute 0.0  0.0 - 0.1 (K/uL)  BASIC METABOLIC PANEL      Component Value Range   Sodium 139  135 - 145 (mEq/L)   Potassium 3.3 (*) 3.5 - 5.1 (mEq/L)   Chloride 102  96 - 112 (mEq/L)   CO2 30  19 - 32 (mEq/L)   Glucose, Bld 104 (*) 70 - 99 (mg/dL)   BUN 11  6 - 23 (mg/dL)   Creatinine, Ser 4.54  0.50 - 1.35  (mg/dL)   Calcium 9.0  8.4 - 09.8 (mg/dL)   GFR calc non Af Amer 84 (*) >90 (mL/min)   GFR calc Af Amer >90  >90 (mL/min)  GLUCOSE, CAPILLARY      Component Value Range   Glucose-Capillary 168 (*) 70 - 99 (mg/dL)  GLUCOSE, CAPILLARY      Component Value Range   Glucose-Capillary 141 (*) 70 - 99 (mg/dL)   Comment 1 Notify RN     Comment 2 Documented in Chart       Signed: Arie Gable NEVILL 08/02/2011, 7:17 AM

## 2011-08-02 NOTE — Progress Notes (Signed)
08/02/2011 Raynelle Bring BSN CCM [phone] Pt was discharged home prior to being seen by CM to arrange [facility] services. CM called patient and spoke with patient and spouse. Spouse is requesting [facility] for wound care(has used them in the past). [facility].  Wound care Clinic notified of request to see patient next Friday. They requested to speak with pt's spouse to set up appt. Spouse advised and she will call clinic to set up appt.

## 2011-08-04 LAB — CULTURE, BLOOD (ROUTINE X 2)
Culture  Setup Time: 201212241001
Culture: NO GROWTH

## 2011-08-12 ENCOUNTER — Encounter (HOSPITAL_BASED_OUTPATIENT_CLINIC_OR_DEPARTMENT_OTHER): Payer: Medicare Other | Attending: Internal Medicine

## 2011-08-12 DIAGNOSIS — I739 Peripheral vascular disease, unspecified: Secondary | ICD-10-CM | POA: Insufficient documentation

## 2011-08-12 DIAGNOSIS — Z8673 Personal history of transient ischemic attack (TIA), and cerebral infarction without residual deficits: Secondary | ICD-10-CM | POA: Insufficient documentation

## 2011-08-12 DIAGNOSIS — I509 Heart failure, unspecified: Secondary | ICD-10-CM | POA: Insufficient documentation

## 2011-08-12 DIAGNOSIS — L8990 Pressure ulcer of unspecified site, unspecified stage: Secondary | ICD-10-CM | POA: Insufficient documentation

## 2011-08-12 DIAGNOSIS — Z79899 Other long term (current) drug therapy: Secondary | ICD-10-CM | POA: Insufficient documentation

## 2011-08-12 DIAGNOSIS — E785 Hyperlipidemia, unspecified: Secondary | ICD-10-CM | POA: Insufficient documentation

## 2011-08-12 DIAGNOSIS — E1169 Type 2 diabetes mellitus with other specified complication: Secondary | ICD-10-CM | POA: Insufficient documentation

## 2011-08-12 DIAGNOSIS — L97509 Non-pressure chronic ulcer of other part of unspecified foot with unspecified severity: Secondary | ICD-10-CM | POA: Insufficient documentation

## 2011-08-12 DIAGNOSIS — I10 Essential (primary) hypertension: Secondary | ICD-10-CM | POA: Insufficient documentation

## 2011-08-12 DIAGNOSIS — L89109 Pressure ulcer of unspecified part of back, unspecified stage: Secondary | ICD-10-CM | POA: Insufficient documentation

## 2011-08-13 NOTE — Progress Notes (Unsigned)
[facility]  NAME:  Nicholas [name], Nicholas [name]              ACCOUNT NO.:  [id number]  MEDICAL RECORD NO.:  [id number]      DATE OF BIRTH:  1940/09/03  PHYSICIAN:  Jonelle Sports. Sevier, M.D.  VISIT DATE:  08/12/2011                                  OFFICE VISIT   HISTORY:  This 71 year old Native-American male is seen for evaluation of a sacral ulcer and also a plantar ulcer on the left foot.  The patient has been seen in this clinic previously with the same 2 lesions and was last seen here in May 2012.  At that time, he still had active ulcers in both areas.  The sacral wound being treated with Desitin cream and the diabetic foot ulcer being considered for Apligraf application.  He apparently was lost to follow up at that time and reports that he has been to the hospital 11 times since, being unclear as to whether these were hospitalizations or simply emergency room visits but I suspect most were the latter.  These visits were supposedly for cellulitis in the lower extremities, the first of which was found to have MRSA infection and this has been thought to have a role in each of his subsequent infections, although never again redemonstrated.  He has been treated with short courses of antibiotics on multiple occasions.  His most recent such experience included a hospitalization from December 24 through August 02, 2011 at which time he had recurrent cellulitis with fever and hypotension which improved with intravenous hydration, intravenous antibiotics.  Blood cultures at that time was positive for coag-negative staph.  He was treated nonetheless to cover MRSA and was discharged with 10 more days of oral therapy of Bactrim and rifampin for this problem.  These medicines were completed yesterday.  At the time of hospitalization, he was noted again to have an active sacral ulcer and also an ulcer on the instep of his left foot, which appeared quite chronic in nature,  although the wife insisted it had healed at some point after the last visit here in May.  PAST MEDICAL HISTORY:  Stent placements in his coronary arteries, cholecystectomy 10 years ago, appendectomy approximately that same time, left knee arthroscopic surgery, several eye surgeries, and 6 disk surgeries in his back.  His medical problems have included congestive heart failure, hypertension, peripheral artery disease, anxiety, glaucoma, hyperlipidemia, benign prostatic hypertrophy, and a CVA with minimal residual.  He is said to be allergic to no medications.  His regular medications include allopurinol 300 mg daily, Xanax 1 mg daily, Tenormin 50 mg b.i.d., Combigan eye drops at bedtime, Plavix 75 mcg daily, Depakote 250 mg twice daily, Lasix 80 mg twice daily, Glucotrol 5 mg twice daily, hydrocodone 7.5/750 every 6 hours, Lantus 30 units nightly at bedtime, Xalatan eye drops nightly at bedtime, Zaroxolyn 5 mg twice weekly, nitroglycerin 0.4 mg p.r.n., Prilosec 20 mg b.i.d., simvastatin 20 mg daily, Restoril 30 mg daily, Hytrin 5 mg daily.  FAMILY HISTORY:  Not available at this time, but would appear to be not particularly contributory to the patient's present situation.  PERSONAL HISTORY:  The patient is married and lives with his wife.  He is able to meet most of his own needs.  He is legally blind.  Has no impairment of  hearing.  He seems to comprehend reasonably well.  Denies any personal health concerns other than those things already mentioned. He is not a smoker and does not use alcoholic beverage at this time.  REVIEW OF SYSTEMS:  Reveals most of the problems indicated above with no significant recent change in symptomatology and most specifically with no angina though symptoms suggest congestive heart failure and no TIAs.  PHYSICAL EXAMINATION:  VITAL SIGNS:  Blood pressure is 126/70, pulse is 44 and regular.  Respirations are 18.  Temperature is 98.2.  Capillary blood  glucose is 100 mg/dL (incidentally that pulse was recorded by the nurse, at the time of my examination, his pulse was in the 60s and regular.) GENERAL:  This is an alert, obese, cooperative but rather passive Native- American male appearing his stated age. HEENT:  His mucous membranes are moist and pink. SKIN:  Warm and dry with good texture and turgor. CHEST:  Grossly clear to auscultation. CARDIOVASCULAR:  His heart tones are of poor quality but there is no definite murmur or gallop.  The rate is normal as indicated. ABDOMEN:  Obese and nontender.  No organomegaly or masses are appreciated.  The sacral area reveals evidence of much larger past ulcer but an active ulcer there now which is rather superficial in nature but measures 3.7 x 0.5 x 0.2 cm.  It is not malodorous and has minimal drainage. EXTREMITIES:  Show 1+ edema bilaterally.  Distal pulses are palpable. Capillary filling in the toes appears reasonably adequate.  He does have diminished sensation throughout his feet.  On the instep of the left foot is an ulcer measuring 1.6 x 1.3 x 0.3 cm with a fairly clean based but an extremely shaggy margin and no significant surrounding erythema or other difficulties.  IMPRESSION: 1. 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  2.  Diabetic foot ulcer, Wagner stage II, instep left     foot.  DISPOSITION: 1. The sacral ulcer requires no debridement.  I have cleaned it up a     bit with the use of Q-Tips, etc.  That will be treated with every     other applications of collagen and Hydrogel. 2. The wound on the plantar aspect of the left foot is sharply     debrided with resection to the margins making the wound after     debridement somewhat larger in all dimensions.  The wound base is     relatively clean, but is gently scraped.  Following this, the wound     is treated with application of collagen and Hydrogel and this will     be changed at  home as well on an every-other-day basis. 3. Followup visit for the patient here will be in 3 weeks, sooner if     there are problems.         ______________________________ Jonelle Sports Cheryll Cockayne, M.D.    RES/MEDQ  D:  08/12/2011  T:  08/13/2011  Job:  [id number]

## 2011-08-28 ENCOUNTER — Inpatient Hospital Stay (HOSPITAL_COMMUNITY)
Admission: EM | Admit: 2011-08-28 | Discharge: 2011-09-03 | DRG: 871 | Disposition: A | Discharge status: Home Health | Payer: Medicare Other | Attending: Internal Medicine | Admitting: Internal Medicine

## 2011-08-28 ENCOUNTER — Emergency Department (HOSPITAL_COMMUNITY): Payer: Medicare Other

## 2011-08-28 ENCOUNTER — Encounter (HOSPITAL_COMMUNITY): Payer: Self-pay

## 2011-08-28 DIAGNOSIS — I509 Heart failure, unspecified: Secondary | ICD-10-CM | POA: Diagnosis present

## 2011-08-28 DIAGNOSIS — Z66 Do not resuscitate: Secondary | ICD-10-CM | POA: Diagnosis present

## 2011-08-28 DIAGNOSIS — L97509 Non-pressure chronic ulcer of other part of unspecified foot with unspecified severity: Secondary | ICD-10-CM

## 2011-08-28 DIAGNOSIS — K219 Gastro-esophageal reflux disease without esophagitis: Secondary | ICD-10-CM | POA: Diagnosis present

## 2011-08-28 DIAGNOSIS — L8994 Pressure ulcer of unspecified site, stage 4: Secondary | ICD-10-CM | POA: Diagnosis present

## 2011-08-28 DIAGNOSIS — L98499 Non-pressure chronic ulcer of skin of other sites with unspecified severity: Secondary | ICD-10-CM | POA: Diagnosis present

## 2011-08-28 DIAGNOSIS — J9600 Acute respiratory failure, unspecified whether with hypoxia or hypercapnia: Secondary | ICD-10-CM | POA: Diagnosis not present

## 2011-08-28 DIAGNOSIS — K5730 Diverticulosis of large intestine without perforation or abscess without bleeding: Secondary | ICD-10-CM | POA: Diagnosis present

## 2011-08-28 DIAGNOSIS — I130 Hypertensive heart and chronic kidney disease with heart failure and stage 1 through stage 4 chronic kidney disease, or unspecified chronic kidney disease: Secondary | ICD-10-CM | POA: Diagnosis present

## 2011-08-28 DIAGNOSIS — I119 Hypertensive heart disease without heart failure: Secondary | ICD-10-CM

## 2011-08-28 DIAGNOSIS — R6510 Systemic inflammatory response syndrome (SIRS) of non-infectious origin without acute organ dysfunction: Secondary | ICD-10-CM | POA: Diagnosis present

## 2011-08-28 DIAGNOSIS — Z794 Long term (current) use of insulin: Secondary | ICD-10-CM

## 2011-08-28 DIAGNOSIS — L02419 Cutaneous abscess of limb, unspecified: Secondary | ICD-10-CM | POA: Diagnosis present

## 2011-08-28 DIAGNOSIS — E872 Acidosis, unspecified: Secondary | ICD-10-CM | POA: Diagnosis present

## 2011-08-28 DIAGNOSIS — Z8673 Personal history of transient ischemic attack (TIA), and cerebral infarction without residual deficits: Secondary | ICD-10-CM

## 2011-08-28 DIAGNOSIS — E875 Hyperkalemia: Secondary | ICD-10-CM | POA: Diagnosis present

## 2011-08-28 DIAGNOSIS — L0390 Cellulitis, unspecified: Secondary | ICD-10-CM

## 2011-08-28 DIAGNOSIS — R579 Shock, unspecified: Secondary | ICD-10-CM | POA: Diagnosis not present

## 2011-08-28 DIAGNOSIS — G40909 Epilepsy, unspecified, not intractable, without status epilepticus: Secondary | ICD-10-CM | POA: Diagnosis present

## 2011-08-28 DIAGNOSIS — A419 Sepsis, unspecified organism: Principal | ICD-10-CM | POA: Diagnosis present

## 2011-08-28 DIAGNOSIS — L03116 Cellulitis of left lower limb: Secondary | ICD-10-CM | POA: Diagnosis present

## 2011-08-28 DIAGNOSIS — R1110 Vomiting, unspecified: Secondary | ICD-10-CM | POA: Diagnosis not present

## 2011-08-28 DIAGNOSIS — E876 Hypokalemia: Secondary | ICD-10-CM | POA: Diagnosis not present

## 2011-08-28 DIAGNOSIS — Z6841 Body Mass Index (BMI) 40.0 and over, adult: Secondary | ICD-10-CM

## 2011-08-28 DIAGNOSIS — E1159 Type 2 diabetes mellitus with other circulatory complications: Secondary | ICD-10-CM | POA: Diagnosis present

## 2011-08-28 DIAGNOSIS — N189 Chronic kidney disease, unspecified: Secondary | ICD-10-CM | POA: Diagnosis present

## 2011-08-28 DIAGNOSIS — I2510 Atherosclerotic heart disease of native coronary artery without angina pectoris: Secondary | ICD-10-CM | POA: Diagnosis present

## 2011-08-28 DIAGNOSIS — I872 Venous insufficiency (chronic) (peripheral): Secondary | ICD-10-CM | POA: Diagnosis present

## 2011-08-28 DIAGNOSIS — N179 Acute kidney failure, unspecified: Secondary | ICD-10-CM | POA: Diagnosis not present

## 2011-08-28 DIAGNOSIS — Z951 Presence of aortocoronary bypass graft: Secondary | ICD-10-CM

## 2011-08-28 DIAGNOSIS — Z8614 Personal history of Methicillin resistant Staphylococcus aureus infection: Secondary | ICD-10-CM

## 2011-08-28 DIAGNOSIS — G4730 Sleep apnea, unspecified: Secondary | ICD-10-CM | POA: Diagnosis present

## 2011-08-28 DIAGNOSIS — I739 Peripheral vascular disease, unspecified: Secondary | ICD-10-CM | POA: Diagnosis present

## 2011-08-28 DIAGNOSIS — I248 Other forms of acute ischemic heart disease: Secondary | ICD-10-CM | POA: Diagnosis not present

## 2011-08-28 DIAGNOSIS — R109 Unspecified abdominal pain: Secondary | ICD-10-CM | POA: Diagnosis not present

## 2011-08-28 DIAGNOSIS — F341 Dysthymic disorder: Secondary | ICD-10-CM | POA: Diagnosis present

## 2011-08-28 DIAGNOSIS — E785 Hyperlipidemia, unspecified: Secondary | ICD-10-CM | POA: Diagnosis present

## 2011-08-28 DIAGNOSIS — N400 Enlarged prostate without lower urinary tract symptoms: Secondary | ICD-10-CM | POA: Diagnosis present

## 2011-08-28 DIAGNOSIS — M109 Gout, unspecified: Secondary | ICD-10-CM | POA: Diagnosis present

## 2011-08-28 DIAGNOSIS — Z7902 Long term (current) use of antithrombotics/antiplatelets: Secondary | ICD-10-CM

## 2011-08-28 DIAGNOSIS — R509 Fever, unspecified: Secondary | ICD-10-CM

## 2011-08-28 DIAGNOSIS — H409 Unspecified glaucoma: Secondary | ICD-10-CM | POA: Diagnosis present

## 2011-08-28 DIAGNOSIS — E1169 Type 2 diabetes mellitus with other specified complication: Secondary | ICD-10-CM | POA: Diagnosis present

## 2011-08-28 DIAGNOSIS — L89109 Pressure ulcer of unspecified part of back, unspecified stage: Secondary | ICD-10-CM | POA: Diagnosis present

## 2011-08-28 DIAGNOSIS — I2489 Other forms of acute ischemic heart disease: Secondary | ICD-10-CM | POA: Diagnosis not present

## 2011-08-28 DIAGNOSIS — E11621 Type 2 diabetes mellitus with foot ulcer: Secondary | ICD-10-CM

## 2011-08-28 HISTORY — DX: Atherosclerotic heart disease of native coronary artery without angina pectoris: I25.10

## 2011-08-28 LAB — COMPREHENSIVE METABOLIC PANEL
ALT: 9 U/L (ref 0–53)
AST: 13 U/L (ref 0–37)
Alkaline Phosphatase: 97 U/L (ref 39–117)
CO2: 29 mEq/L (ref 19–32)
Chloride: 100 mEq/L (ref 96–112)
GFR calc non Af Amer: 70 mL/min — ABNORMAL LOW (ref >90)
Sodium: 137 mEq/L (ref 135–145)
Total Bilirubin: 0.3 mg/dL (ref 0.3–1.2)

## 2011-08-28 LAB — URINALYSIS, ROUTINE W REFLEX MICROSCOPIC
Bilirubin Urine: NEGATIVE
Glucose, UA: NEGATIVE mg/dL
Ketones, ur: NEGATIVE mg/dL
Leukocytes, UA: NEGATIVE
pH: 7.5 (ref 5.0–8.0)

## 2011-08-28 LAB — CBC
HCT: 46 % (ref 39.0–52.0)
Platelets: 172 10*3/uL (ref 150–400)
RDW: 15.5 % (ref 11.5–15.5)
WBC: 16.3 10*3/uL — ABNORMAL HIGH (ref 4.0–10.5)

## 2011-08-28 LAB — DIFFERENTIAL
Basophils Absolute: 0 10*3/uL (ref 0.0–0.1)
Lymphocytes Relative: 5 % — ABNORMAL LOW (ref 12–46)
Neutro Abs: 14.5 10*3/uL — ABNORMAL HIGH (ref 1.7–7.7)

## 2011-08-28 LAB — URINE MICROSCOPIC-ADD ON

## 2011-08-28 MED ORDER — ACETAMINOPHEN 325 MG PO TABS
650.0000 mg | ORAL_TABLET | Freq: Once | ORAL | Status: AC
  Administered 2011-08-28: 650 mg via ORAL
  Filled 2011-08-28 (×2): qty 1

## 2011-08-28 MED ORDER — VANCOMYCIN HCL IN DEXTROSE 1-5 GM/200ML-% IV SOLN
1000.0000 mg | Freq: Once | INTRAVENOUS | Status: AC
  Administered 2011-08-28: 1000 mg via INTRAVENOUS
  Filled 2011-08-28 (×2): qty 200

## 2011-08-28 MED ORDER — INSULIN ASPART 100 UNIT/ML ~~LOC~~ SOLN: 0.0000 [IU] | Freq: Three times a day (TID) | SUBCUTANEOUS | Status: DC

## 2011-08-28 MED ORDER — SODIUM POLYSTYRENE SULFONATE 15 GM/60ML PO SUSP
15.0000 g | Freq: Once | ORAL | Status: DC
  Filled 2011-08-28: qty 60

## 2011-08-28 MED ORDER — SODIUM CHLORIDE 0.9 % IV BOLUS (SEPSIS)
1000.0000 mL | Freq: Once | INTRAVENOUS | Status: AC
  Administered 2011-08-28: 1000 mL via INTRAVENOUS

## 2011-08-28 MED ORDER — IBUPROFEN 800 MG PO TABS
800.0000 mg | ORAL_TABLET | Freq: Once | ORAL | Status: AC
  Administered 2011-08-28: 800 mg via ORAL
  Filled 2011-08-28: qty 1

## 2011-08-28 NOTE — H&P (Signed)
PCP:   Pearla Dubonnet, MD, MD   Chief Complaint:  Fevers and Left leg discomfort  HPI: Pt is a 71 y/o with h/o diabetes, CVA, PVD, and diabetic foot ulcer.  Presents to the ED c/o left leg erythema and discomfort which he and his wife states developed over one day.  Patient was seen at the end of December for the same problem.  Reportedly patient was given 2 weeks of antibiotics which he completed.  Has been off of antibiotics for 2 weeks and presents today despite a recent visit with his PCP Dr. Kevan Ny, with erythema and worsening discomfort of his LLE.  Today patient developed fever, confusion, and left lower leg discomfort and it was decided to bring patient into the ED for further evaluation.  While here patient received vancomycin and we were asked to evaluate patient for admission.  Pt had a temp while here of 103 and WBC of 16.3.  X ray of the left foot revealed no evidence of osteomyelitis.  Other than the foot discomfort patient denies any chest pain, cough, dysuria, nausea, dyaphoresis.  Allergies:  No Known Allergies    Past Medical History  Diagnosis Date  . CHF (congestive heart failure)   . Hypertension   . Diabetes mellitus   . CVA (cerebral infarction)   . Epilepsy   . Hx of CABG   . Peripheral vascular disease   . Ulcer     left foot  . Cellulitis   . Chronic kidney disease   . Dyslipidemia   . Chronic venous insufficiency   . Sleep apnea   . Morbid obesity   . Diverticulosis   . Benign prostatic hypertrophy   . Glaucoma   . Staphylococcus aureus infection   . Gout   . Depression   . Insomnia   . Reflux   . Coronary artery disease     Past Surgical History  Procedure Date  . Knee surgery     bilateral  . Aortogram w/ angioplasty 05/07/11  . Coronary artery bypass graft     x7 vessels    Prior to Admission medications   Medication Sig Start Date End Date Taking? Authorizing Provider  allopurinol (ZYLOPRIM) 300 MG tablet Take 300 mg by mouth  daily.     Yes Historical Provider, MD  ALPRAZolam Prudy Feeler) 1 MG tablet Take 1-2 mg by mouth 3 (three) times daily. Takes 1 tablet in the morning and at 430 pm and 2 tablets at bedtime.   Yes Historical Provider, MD  atenolol (TENORMIN) 50 MG tablet Take 50 mg by mouth 2 (two) times daily.    Yes Historical Provider, MD  brimonidine-timolol (COMBIGAN) 0.2-0.5 % ophthalmic solution Place 1 drop into both eyes every 12 (twelve) hours.     Yes Historical Provider, MD  clopidogrel (PLAVIX) 75 MG tablet Take 75 mg by mouth daily.     Yes Historical Provider, MD  divalproex (DEPAKOTE) 250 MG 24 hr tablet Take 250 mg by mouth daily.     Yes Historical Provider, MD  furosemide (LASIX) 80 MG tablet Take 80 mg by mouth 2 (two) times daily.     Yes Historical Provider, MD  glipiZIDE (GLUCOTROL) 5 MG tablet Take 5 mg by mouth 2 (two) times daily before a meal.     Yes Historical Provider, MD  HYDROcodone-acetaminophen (VICODIN ES) 7.5-750 MG per tablet Take 1 tablet by mouth every 6 (six) hours as needed. For pain   Yes Historical Provider, MD  insulin glargine (  LANTUS) 100 UNIT/ML injection Inject 5-10 Units into the skin at bedtime as needed.    Yes Historical Provider, MD  latanoprost (XALATAN) 0.005 % ophthalmic solution Place 1 drop into both eyes at bedtime.     Yes Historical Provider, MD  metolazone (ZAROXOLYN) 5 MG tablet Take 5 mg by mouth 2 (two) times a week. Takes on Mondays and Fridays   Yes Historical Provider, MD  olopatadine (PATANOL) 0.1 % ophthalmic solution Place 1 drop into both eyes 2 (two) times daily.     Yes Historical Provider, MD  omeprazole (PRILOSEC) 20 MG capsule Take 20 mg by mouth 2 (two) times daily as needed. For heart burn   Yes Historical Provider, MD  simvastatin (ZOCOR) 20 MG tablet Take 20 mg by mouth daily.    Yes Historical Provider, MD  temazepam (RESTORIL) 30 MG capsule Take 30 mg by mouth at bedtime.    Yes Historical Provider, MD  timolol (BETIMOL) 0.5 % ophthalmic  solution Place 1 drop into the left eye 2 (two) times daily.     Yes Historical Provider, MD  nitroGLYCERIN (NITROSTAT) 0.4 MG SL tablet Place 0.4 mg under the tongue every 5 (five) minutes as needed. For chest pain    Historical Provider, MD    Social History:  reports that he quit smoking about 20 years ago. His smoking use included Cigarettes. He has never used smokeless tobacco. He reports that he does not drink alcohol or use illicit drugs.  Family History  Problem Relation Age of Onset  . Hypertension Other   . Cancer Other     Review of Systems:  Constitutional: Denies fever, chills, diaphoresis, appetite change and fatigue.  HEENT: Denies photophobia, eye pain, redness, hearing loss, ear pain, congestion, sore throat, rhinorrhea, sneezing, mouth sores, trouble swallowing, neck pain, neck stiffness and tinnitus.   Respiratory: Denies SOB, DOE, cough, chest tightness,  and wheezing.   Cardiovascular: Denies chest pain, palpitations and leg swelling.  Gastrointestinal: Denies nausea, vomiting, abdominal pain, diarrhea, constipation, blood in stool and abdominal distention.  Genitourinary: Denies dysuria, urgency, frequency, hematuria, flank pain and difficulty urinating.  Musculoskeletal: Denies myalgias, back pain, joint swelling, arthralgias and gait problem.  Skin: Denies pallor, + rash and wound.  Neurological: Denies dizziness, seizures, syncope, weakness, light-headedness, numbness and headaches.  Hematological: Denies adenopathy. Easy bruising, personal or family bleeding history  Psychiatric/Behavioral: Denies suicidal ideation, mood changes, confusion, nervousness, sleep disturbance and agitation   Physical Exam: Blood pressure 190/80, pulse 86, temperature 102.8 F (39.3 C), temperature source Rectal, SpO2 99.00%. Constitutional: Alert and oriented X 3. He appears well-developed and well-nourished. No distress.  Head: Normocephalic and atraumatic.  Mouth/Throat:  Oropharynx is clear and moist.  Neck: Normal range of motion. Neck supple.  No meningeal signs  Cardiovascular: Normal rate and regular rhythm.  No murmur heard.  Pulmonary/Chest: Breath sounds normal. Tachypnea noted. No respiratory distress. He has no wheezes. He has no rales.  Abdominal: Soft. There is no tenderness.  Morbidly obese  Musculoskeletal:  Chronic appearing left foot ulcer. Diffuse swelling to left lower extremity with erythema extending up to the knee with increased warmth. There is diffuse mild tenderness to palpation. No induration or localized fluctuance. Normal movement in toes with normal cap refill.  Neurological: He is alert and oriented to person, place, and time.  Patient is drowsy.  Skin: Skin is warm. There is erythema.  Psychiatric: He has a normal mood and affect. His behavior is normal.    Labs  on Admission:  Results for orders placed during the hospital encounter of 08/28/11 (from the past 48 hour(s))  CBC     Status: Abnormal   Collection Time   08/28/11  8:30 PM      Component Value Range Comment   WBC 16.3 (*) 4.0 - 10.5 (K/uL)    RBC 4.83  4.22 - 5.81 (MIL/uL)    Hemoglobin 14.7  13.0 - 17.0 (g/dL)    HCT 96.2  95.2 - 84.1 (%)    MCV 95.2  78.0 - 100.0 (fL)    MCH 30.4  26.0 - 34.0 (pg)    MCHC 32.0  30.0 - 36.0 (g/dL)    RDW 32.4  40.1 - 02.7 (%)    Platelets 172  150 - 400 (K/uL)   DIFFERENTIAL     Status: Abnormal   Collection Time   08/28/11  8:30 PM      Component Value Range Comment   Neutrophils Relative 89 (*) 43 - 77 (%)    Neutro Abs 14.5 (*) 1.7 - 7.7 (K/uL)    Lymphocytes Relative 5 (*) 12 - 46 (%)    Lymphs Abs 0.9  0.7 - 4.0 (K/uL)    Monocytes Relative 5  3 - 12 (%)    Monocytes Absolute 0.9  0.1 - 1.0 (K/uL)    Eosinophils Relative 0  0 - 5 (%)    Eosinophils Absolute 0.0  0.0 - 0.7 (K/uL)    Basophils Relative 0  0 - 1 (%)    Basophils Absolute 0.0  0.0 - 0.1 (K/uL)   COMPREHENSIVE METABOLIC PANEL     Status: Abnormal    Collection Time   08/28/11  8:30 PM      Component Value Range Comment   Sodium 137  135 - 145 (mEq/L)    Potassium 5.3 (*) 3.5 - 5.1 (mEq/L)    Chloride 100  96 - 112 (mEq/L)    CO2 29  19 - 32 (mEq/L)    Glucose, Bld 108 (*) 70 - 99 (mg/dL)    BUN 13  6 - 23 (mg/dL)    Creatinine, Ser 2.53  0.50 - 1.35 (mg/dL)    Calcium 9.3  8.4 - 10.5 (mg/dL)    Total Protein 7.3  6.0 - 8.3 (g/dL)    Albumin 3.3 (*) 3.5 - 5.2 (g/dL)    AST 13  0 - 37 (U/L) SLIGHT HEMOLYSIS   ALT 9  0 - 53 (U/L)    Alkaline Phosphatase 97  39 - 117 (U/L)    Total Bilirubin 0.3  0.3 - 1.2 (mg/dL)    GFR calc non Af Amer 70 (*) >90 (mL/min)    GFR calc Af Amer 81 (*) >90 (mL/min)   LIPASE, BLOOD     Status: Normal   Collection Time   08/28/11  8:30 PM      Component Value Range Comment   Lipase 20  11 - 59 (U/L)   LACTIC ACID, PLASMA     Status: Normal   Collection Time   08/28/11  8:50 PM      Component Value Range Comment   Lactic Acid, Venous 2.1  0.5 - 2.2 (mmol/L)   URINALYSIS, ROUTINE W REFLEX MICROSCOPIC     Status: Abnormal   Collection Time   08/28/11  8:55 PM      Component Value Range Comment   Color, Urine YELLOW  YELLOW     APPearance CLEAR  CLEAR  Specific Gravity, Urine 1.015  1.005 - 1.030     pH 7.5  5.0 - 8.0     Glucose, UA NEGATIVE  NEGATIVE (mg/dL)    Hgb urine dipstick TRACE (*) NEGATIVE     Bilirubin Urine NEGATIVE  NEGATIVE     Ketones, ur NEGATIVE  NEGATIVE (mg/dL)    Protein, ur >161 (*) NEGATIVE (mg/dL)    Urobilinogen, UA 0.2  0.0 - 1.0 (mg/dL)    Nitrite NEGATIVE  NEGATIVE     Leukocytes, UA NEGATIVE  NEGATIVE    URINE MICROSCOPIC-ADD ON     Status: Abnormal   Collection Time   08/28/11  8:55 PM      Component Value Range Comment   Squamous Epithelial / LPF RARE  RARE     RBC / HPF 3-6  <3 (RBC/hpf)    Casts HYALINE CASTS (*) NEGATIVE      Radiological Exams on Admission: Dg Chest 2 View  08/28/2011  *RADIOLOGY REPORT*  Clinical Data: Fever and shortness of breath.   Weakness.  CHEST - 2 VIEW  Comparison: Portable chest 07/29/2011.  Findings: The heart is enlarged.  Mild edema is present. No focal airspace disease is evident.  The lateral view is obscured by motion.  Mild degenerative changes of the thoracic spine are stable.  IMPRESSION:  1.  Cardiomegaly and mild edema, compatible with congestive heart failure.  Original Report Authenticated By: Jamesetta Orleans. MATTERN, M.D.   Dg Foot Complete Left  08/28/2011  *RADIOLOGY REPORT*  Clinical Data: Wound of the plantar surface left foot.  Question osteomyelitis.  LEFT FOOT - COMPLETE 3+ VIEW  Comparison: Plain films left foot 07/28/2010.  Findings: Soft tissues of the foot are massively swollen.  No plain film evidence of osteomyelitis is identified.  Midfoot degenerative disease or Charcot change has progressed.  Plantar calcaneal spur noted.  IMPRESSION: Massive soft tissue swelling.  No plain film evidence of osteomyelitis.  Original Report Authenticated By: Bernadene Bell. Maricela Curet, M.D.    Assessment/Plan Left leg cellulitis: Reportedly patient was admitted for this recently in December. - Will plan on placing patient on vanc/zosyn.   - Obtain wound culture and wound care consult.   - Follow WBC counts and Temperatures. - Will obtain blood cultures as well  Diabetic foot ulcer:  Most likely causing # 1.  Please see above.   DM:  Pt is on lantus at home will plan on continuing and placing on SSI  H/o CVA: continue plavix and zocor  Sleep apnea:  Pt reportedly is on 2 L [place] while at sleep.  Will plan on continuing while here  CKD:  Stable.  Creatinine is 1.06.  Dyslipedemia: Pt is on Zocor will continue  Glaucoma:  Continue home medications  Gout:  Stable at this juncture.  Cont Allopurinol  Depression/Anxiety:  Stable will continue home regimen.  Reflux:  Will place on protonix.  CAD:  Pt is s/p CABG will continue home regimen.  Hyperkalemia: At 5.3 should improve with insulin administration and  lasix.  Will administer kayexalate and check BMP in the AM.  Code status: DNR  Time Spent on Admission: > 60 minutes  Time spent discussing case with patient, PE, obtaining information, reviewing patient's medications and history.  Penny Pia [facility] Pager: [phone] 08/28/2011, 10:47 PM

## 2011-08-28 NOTE — ED Provider Notes (Signed)
Medical screening examination/treatment/procedure(s) were conducted as a shared visit with non-physician practitioner(s) and myself.  I personally evaluated the patient during the encounter Patient with fever of 103 today and warm swollen left leg consistent with cellulitis. He has a diabetic foot ulcer on his left lower foot without surrounding erythema.  Gwyneth Sprout, MD 08/28/11 [id number]

## 2011-08-28 NOTE — ED Notes (Signed)
Blood culture x1 sent to the lab if needed.Selena Batten, RN aware

## 2011-08-28 NOTE — ED Notes (Signed)
Pt from home. Brought to the ER for increased weakness and confusion. Pt  Alert to self, bday, year and president's name. Pt has bilateral swelling to the lower extremities and left leg is larger than the right, swollen, tight, inflamed, and weeping. Pt has a stage 4 pressure sore on his sacrum and a stage 2 pressure sore on the bottom of his left foot. Performed wound care on both pressure sore sites.

## 2011-08-28 NOTE — ED Notes (Signed)
Unable to get second set of blood cultures. Pt already d/c from department.

## 2011-08-28 NOTE — ED Notes (Signed)
Hospitalist at bedside 

## 2011-08-28 NOTE — ED Notes (Signed)
Pressure ulcers noted. Old dressing removed and new dressing applied to both sites.

## 2011-08-28 NOTE — ED Notes (Signed)
Patient transported to X-ray 

## 2011-08-28 NOTE — ED Notes (Signed)
Pt only complains of leg pain and general weakness

## 2011-08-28 NOTE — ED Notes (Signed)
Sent by Dr Kevan Ny for weakness and extremity swelling, per ems pt seems confused and doesn't know the date, per ems cbg 64 and 1/2 amp d50 given by ems, pt is also hypertensive

## 2011-08-28 NOTE — ED Provider Notes (Signed)
History     CSN: [id number]  Arrival date & time 08/28/11  1947   First MD Initiated Contact with Patient 08/28/11 2022      Chief Complaint  Patient presents with  . Extremity Weakness  . Leg Swelling  . Fever     HPI  History provided by the patient and S. Patient is a 71 year old male with past history of diabetes, hypertension, CHF, chronic kidney disease and left foot are presents with complaints of increased left lower extremity swelling, pain and fever. Patient has currently been undergoing treatments at the [facility] for the past several weeks for a slowly healing left foot ulcer. Patient was previously on antibiotics for suspected infection but has finished these over a week ago. Today patient began to acutely feel increased weakness, fever and increased swelling and left lower leg. She and spouse states that she called PCP, Dr. Kevan Ny and was instructed to come to the emergency room. Patient does not report any aggravating or alleviating factors. Patient has not taken any additional new medications today. Patient denies any increase shortness of breath, nausea, vomiting, diarrhea or constipation.    Past Medical History  Diagnosis Date  . CHF (congestive heart failure)   . Hypertension   . Diabetes mellitus   . CVA (cerebral infarction)   . Epilepsy   . Hx of CABG   . Peripheral vascular disease   . Ulcer     left foot  . Cellulitis   . Chronic kidney disease   . Dyslipidemia   . Chronic venous insufficiency   . Sleep apnea   . Morbid obesity   . Diverticulosis   . Benign prostatic hypertrophy   . Glaucoma   . Staphylococcus aureus infection   . Gout   . Depression   . Insomnia   . Reflux   . Coronary artery disease     Past Surgical History  Procedure Date  . Knee surgery     bilateral  . Aortogram w/ angioplasty 05/07/11  . Coronary artery bypass graft     x7 vessels    Family History  Problem Relation Age of Onset  . Hypertension Other   .  Cancer Other     History  Substance Use Topics  . Smoking status: Former Smoker    Types: Cigarettes    Quit date: 07/29/1991  . Smokeless tobacco: Never Used  . Alcohol Use: No     occasionallly      Review of Systems  Constitutional: Positive for fever and appetite change.  Respiratory: Negative for cough and shortness of breath.   Cardiovascular: Negative for chest pain.  Gastrointestinal: Negative for nausea, vomiting, abdominal pain, diarrhea and constipation.  Genitourinary: Negative for dysuria, frequency, hematuria and flank pain.  Skin: Positive for rash.  All other systems reviewed and are negative.    Allergies  Review of patient's allergies indicates no known allergies.  Home Medications   Current Outpatient Rx  Name Route Sig Dispense Refill  . ALLOPURINOL 300 MG PO TABS Oral Take 300 mg by mouth daily.      Marland Kitchen ALPRAZOLAM 1 MG PO TABS Oral Take 1 mg by mouth 2 (two) times daily as needed. 1 bid, 2 at hs    . ATENOLOL 50 MG PO TABS Oral Take 50 mg by mouth 2 (two) times daily.      Marland Kitchen BRIMONIDINE TARTRATE-TIMOLOL 0.2-0.5 % OP SOLN Both Eyes Place 1 drop into both eyes every 12 (twelve) hours.      Marland Kitchen  CLOPIDOGREL BISULFATE 75 MG PO TABS Oral Take 75 mg by mouth daily.      Marland Kitchen DIVALPROEX SODIUM ER 250 MG PO TB24 Oral Take 250 mg by mouth daily.      . FUROSEMIDE 80 MG PO TABS Oral Take 80 mg by mouth 2 (two) times daily.      Marland Kitchen GLIPIZIDE 5 MG PO TABS Oral Take 5 mg by mouth 2 (two) times daily before a meal.      . HYDROCODONE-ACETAMINOPHEN 7.5-750 MG PO TABS Oral Take 1 tablet by mouth every 6 (six) hours as needed.      . INSULIN GLARGINE 100 UNIT/ML [place] SOLN Subcutaneous Inject 30 Units into the skin at bedtime.      Marland Kitchen LATANOPROST 0.005 % OP SOLN Both Eyes Place 1 drop into both eyes at bedtime.      Marland Kitchen METOLAZONE 5 MG PO TABS Oral Take 5 mg by mouth 2 (two) times a week.      Marland Kitchen NITROGLYCERIN 0.4 MG SL SUBL Sublingual Place 0.4 mg under the tongue every 5 (five)  minutes as needed.      . OLOPATADINE HCL 0.1 % OP SOLN Both Eyes Place 1 drop into both eyes 2 (two) times daily.      Marland Kitchen OMEPRAZOLE 20 MG PO CPDR Oral Take 20 mg by mouth 2 (two) times daily.      Marland Kitchen SIMVASTATIN 20 MG PO TABS Oral Take 20 mg by mouth at bedtime.      Marland Kitchen TEMAZEPAM 30 MG PO CAPS Oral Take 30 mg by mouth at bedtime as needed.      . TERAZOSIN HCL 5 MG PO CAPS Oral Take 5 mg by mouth at bedtime.      Marland Kitchen TIMOLOL HEMIHYDRATE 0.5 % OP SOLN Left Eye Place 1 drop into the left eye 2 (two) times daily.        BP 190/80  Pulse 86  Temp(Src) 103.8 F (39.9 C) (Rectal)  SpO2 90%  Physical Exam  Nursing note and vitals reviewed. Constitutional: He is oriented to person, place, and time. He appears well-developed and well-nourished. No distress.  HENT:  Head: Normocephalic and atraumatic.  Mouth/Throat: Oropharynx is clear and moist.  Neck: Normal range of motion. Neck supple.       No meningeal signs  Cardiovascular: Normal rate and regular rhythm.   No murmur heard. Pulmonary/Chest: Breath sounds normal. Tachypnea noted. No respiratory distress. He has no wheezes. He has no rales.  Abdominal: Soft. There is no tenderness.       Morbidly obese  Musculoskeletal:       Chronic appearing left foot ulcer. Diffuse swelling to left lower extremity with erythema extending up to the knee with increased warmth. There is diffuse mild tenderness to palpation. No induration or localized fluctuance. Normal movement in toes with normal cap refill.  Neurological: He is alert and oriented to person, place, and time.       Patient is drowsy.  Skin: Skin is warm. There is erythema.  Psychiatric: He has a normal mood and affect. His behavior is normal.    ED Course  Procedures   Labs Reviewed  CBC  DIFFERENTIAL  COMPREHENSIVE METABOLIC PANEL  LIPASE, BLOOD  LACTIC ACID, PLASMA  URINALYSIS, ROUTINE W REFLEX MICROSCOPIC   Results for orders placed during the hospital encounter of 08/28/11    CBC      Component Value Range   WBC 16.3 (*) 4.0 - 10.5 (K/uL)  RBC 4.83  4.22 - 5.81 (MIL/uL)   Hemoglobin 14.7  13.0 - 17.0 (g/dL)   HCT 16.1  09.6 - 04.5 (%)   MCV 95.2  78.0 - 100.0 (fL)   MCH 30.4  26.0 - 34.0 (pg)   MCHC 32.0  30.0 - 36.0 (g/dL)   RDW 40.9  81.1 - 91.4 (%)   Platelets 172  150 - 400 (K/uL)  DIFFERENTIAL      Component Value Range   Neutrophils Relative 89 (*) 43 - 77 (%)   Neutro Abs 14.5 (*) 1.7 - 7.7 (K/uL)   Lymphocytes Relative 5 (*) 12 - 46 (%)   Lymphs Abs 0.9  0.7 - 4.0 (K/uL)   Monocytes Relative 5  3 - 12 (%)   Monocytes Absolute 0.9  0.1 - 1.0 (K/uL)   Eosinophils Relative 0  0 - 5 (%)   Eosinophils Absolute 0.0  0.0 - 0.7 (K/uL)   Basophils Relative 0  0 - 1 (%)   Basophils Absolute 0.0  0.0 - 0.1 (K/uL)  COMPREHENSIVE METABOLIC PANEL      Component Value Range   Sodium 137  135 - 145 (mEq/L)   Potassium 5.3 (*) 3.5 - 5.1 (mEq/L)   Chloride 100  96 - 112 (mEq/L)   CO2 29  19 - 32 (mEq/L)   Glucose, Bld 108 (*) 70 - 99 (mg/dL)   BUN 13  6 - 23 (mg/dL)   Creatinine, Ser 7.82  0.50 - 1.35 (mg/dL)   Calcium 9.3  8.4 - 95.6 (mg/dL)   Total Protein 7.3  6.0 - 8.3 (g/dL)   Albumin 3.3 (*) 3.5 - 5.2 (g/dL)   AST 13  0 - 37 (U/L)   ALT 9  0 - 53 (U/L)   Alkaline Phosphatase 97  39 - 117 (U/L)   Total Bilirubin 0.3  0.3 - 1.2 (mg/dL)   GFR calc non Af Amer 70 (*) >90 (mL/min)   GFR calc Af Amer 81 (*) >90 (mL/min)  LIPASE, BLOOD      Component Value Range   Lipase 20  11 - 59 (U/L)  LACTIC ACID, PLASMA      Component Value Range   Lactic Acid, Venous 2.1  0.5 - 2.2 (mmol/L)  URINALYSIS, ROUTINE W REFLEX MICROSCOPIC      Component Value Range   Color, Urine YELLOW  YELLOW    APPearance CLEAR  CLEAR    Specific Gravity, Urine 1.015  1.005 - 1.030    pH 7.5  5.0 - 8.0    Glucose, UA NEGATIVE  NEGATIVE (mg/dL)   Hgb urine dipstick TRACE (*) NEGATIVE    Bilirubin Urine NEGATIVE  NEGATIVE    Ketones, ur NEGATIVE  NEGATIVE (mg/dL)    Protein, ur >213 (*) NEGATIVE (mg/dL)   Urobilinogen, UA 0.2  0.0 - 1.0 (mg/dL)   Nitrite NEGATIVE  NEGATIVE    Leukocytes, UA NEGATIVE  NEGATIVE   URINE MICROSCOPIC-ADD ON      Component Value Range   Squamous Epithelial / LPF RARE  RARE    RBC / HPF 3-6  <3 (RBC/hpf)   Casts HYALINE CASTS (*) NEGATIVE       Dg Chest 2 View  08/28/2011  *RADIOLOGY REPORT*  Clinical Data: Fever and shortness of breath.  Weakness.  CHEST - 2 VIEW  Comparison: Portable chest 07/29/2011.  Findings: The heart is enlarged.  Mild edema is present. No focal airspace disease is evident.  The lateral view is obscured by  motion.  Mild degenerative changes of the thoracic spine are stable.  IMPRESSION:  1.  Cardiomegaly and mild edema, compatible with congestive heart failure.  Original Report Authenticated By: Jamesetta Orleans. MATTERN, M.D.     1. Diabetic foot ulcer   2. Cellulitis of left lower extremity   3. Fever       MDM  8:30 PM patient seen and evaluated. Patient in no acute distress.  Patient seen and evaluated with attending physician. She agrees with workup and treatment plan. Will begin antibiotics with vancomycin. Plan to admit for infection.  Spoke with [facility]. They will see patient and admit. They have requested x-rays of the foot to rule out osteo.      Angus Seller, [place] 08/28/11 2206

## 2011-08-28 NOTE — ED Notes (Signed)
ZOX:[id number]<AV> Expected date:08/28/11<BR> Expected time: 7:32 PM<BR> Means of arrival:Ambulance<BR> Comments:<BR> EMS 71 GC, 70 yom weakness w edema in legs

## 2011-08-29 ENCOUNTER — Inpatient Hospital Stay (HOSPITAL_COMMUNITY): Payer: Medicare Other

## 2011-08-29 ENCOUNTER — Encounter (HOSPITAL_COMMUNITY): Payer: Self-pay | Admitting: Cardiology

## 2011-08-29 ENCOUNTER — Other Ambulatory Visit: Payer: Self-pay

## 2011-08-29 DIAGNOSIS — M7989 Other specified soft tissue disorders: Secondary | ICD-10-CM

## 2011-08-29 DIAGNOSIS — I2510 Atherosclerotic heart disease of native coronary artery without angina pectoris: Secondary | ICD-10-CM

## 2011-08-29 DIAGNOSIS — L0291 Cutaneous abscess, unspecified: Secondary | ICD-10-CM

## 2011-08-29 DIAGNOSIS — J9600 Acute respiratory failure, unspecified whether with hypoxia or hypercapnia: Secondary | ICD-10-CM

## 2011-08-29 DIAGNOSIS — L0390 Cellulitis, unspecified: Secondary | ICD-10-CM

## 2011-08-29 DIAGNOSIS — G9340 Encephalopathy, unspecified: Secondary | ICD-10-CM

## 2011-08-29 DIAGNOSIS — J810 Acute pulmonary edema: Secondary | ICD-10-CM

## 2011-08-29 LAB — BLOOD GAS, ARTERIAL
Acid-base deficit: 1.3 mmol/L (ref 0.0–2.0)
Drawn by: 244901
FIO2: 1 %
MECHVT: 600 mL
O2 Saturation: 98.7 %
RATE: 15 resp/min

## 2011-08-29 LAB — CBC
HCT: 40.1 % (ref 39.0–52.0)
Hemoglobin: 12.5 g/dL — ABNORMAL LOW (ref 13.0–17.0)
Hemoglobin: 13.7 g/dL (ref 13.0–17.0)
Platelets: 142 10*3/uL — ABNORMAL LOW (ref 150–400)
RBC: 4.44 MIL/uL (ref 4.22–5.81)
RDW: 15.4 % (ref 11.5–15.5)
WBC: 14.1 10*3/uL — ABNORMAL HIGH (ref 4.0–10.5)
WBC: 15.8 10*3/uL — ABNORMAL HIGH (ref 4.0–10.5)

## 2011-08-29 LAB — CARDIAC PANEL(CRET KIN+CKTOT+MB+TROPI)
CK, MB: 10.8 ng/mL (ref 0.3–4.0)
CK, MB: 10.7 ng/mL (ref 0.3–4.0)
Relative Index: 7.3 — ABNORMAL HIGH (ref 0.0–2.5)
Relative Index: 5.8 — ABNORMAL HIGH (ref 0.0–2.5)
Total CK: 184 U/L (ref 7–232)

## 2011-08-29 LAB — BASIC METABOLIC PANEL WITH GFR
BUN: 14 mg/dL (ref 6–23)
CO2: 26 meq/L (ref 19–32)
Calcium: 8.8 mg/dL (ref 8.4–10.5)
Chloride: 101 meq/L (ref 96–112)
Creatinine, Ser: 1.02 mg/dL (ref 0.50–1.35)
GFR calc Af Amer: 85 mL/min — ABNORMAL LOW
GFR calc non Af Amer: 73 mL/min — ABNORMAL LOW
Glucose, Bld: 68 mg/dL — ABNORMAL LOW (ref 70–99)
Potassium: 5.4 meq/L — ABNORMAL HIGH (ref 3.5–5.1)
Sodium: 135 meq/L (ref 135–145)

## 2011-08-29 LAB — GLUCOSE, CAPILLARY
Glucose-Capillary: 97 mg/dL (ref 70–99)
Glucose-Capillary: 83 mg/dL (ref 70–99)

## 2011-08-29 LAB — CREATININE, SERUM
Creatinine, Ser: 1.1 mg/dL (ref 0.50–1.35)
GFR calc Af Amer: 77 mL/min — ABNORMAL LOW (ref >90)
GFR calc non Af Amer: 67 mL/min — ABNORMAL LOW (ref >90)

## 2011-08-29 LAB — MRSA PCR SCREENING: MRSA by PCR: NEGATIVE

## 2011-08-29 LAB — AMYLASE: Amylase: 21 U/L (ref 0–105)

## 2011-08-29 LAB — LIPASE, BLOOD: Lipase: 15 U/L (ref 11–59)

## 2011-08-29 MED ORDER — INSULIN GLARGINE 100 UNIT/ML ~~LOC~~ SOLN
5.0000 [IU] | Freq: Every day | SUBCUTANEOUS | Status: DC
  Filled 2011-08-29: qty 3

## 2011-08-29 MED ORDER — HYDROCODONE-ACETAMINOPHEN 7.5-325 MG PO TABS
1.5000 | ORAL_TABLET | ORAL | Status: DC | PRN
  Administered 2011-08-29: 1.5 via ORAL
  Filled 2011-08-29: qty 1

## 2011-08-29 MED ORDER — SIMVASTATIN 20 MG PO TABS
20.0000 mg | ORAL_TABLET | Freq: Every day | ORAL | Status: DC
  Administered 2011-08-29 – 2011-09-03 (×6): 20 mg via ORAL
  Filled 2011-08-29 (×6): qty 1

## 2011-08-29 MED ORDER — LIDOCAINE HCL (CARDIAC) 20 MG/ML IV SOLN
INTRAVENOUS | Status: AC
  Filled 2011-08-29: qty 5

## 2011-08-29 MED ORDER — FUROSEMIDE 10 MG/ML IJ SOLN
40.0000 mg | Freq: Two times a day (BID) | INTRAMUSCULAR | Status: DC
  Administered 2011-08-29: 40 mg via INTRAVENOUS
  Filled 2011-08-29 (×3): qty 4

## 2011-08-29 MED ORDER — ALPRAZOLAM 1 MG PO TABS
1.0000 mg | ORAL_TABLET | Freq: Three times a day (TID) | ORAL | Status: DC | PRN
  Administered 2011-08-29: 1 mg via ORAL
  Filled 2011-08-29: qty 1

## 2011-08-29 MED ORDER — ASPIRIN 81 MG PO CHEW
81.0000 mg | CHEWABLE_TABLET | Freq: Every day | ORAL | Status: DC
  Administered 2011-08-29 – 2011-09-03 (×6): 81 mg via ORAL
  Filled 2011-08-29 (×8): qty 1

## 2011-08-29 MED ORDER — TEMAZEPAM 15 MG PO CAPS: 30.0000 mg | ORAL_CAPSULE | Freq: Every day | ORAL | Status: DC

## 2011-08-29 MED ORDER — LATANOPROST 0.005 % OP SOLN
1.0000 [drp] | Freq: Every day | OPHTHALMIC | Status: DC
  Administered 2011-08-29 – 2011-09-02 (×5): 1 [drp] via OPHTHALMIC
  Filled 2011-08-29: qty 2.5

## 2011-08-29 MED ORDER — PANTOPRAZOLE SODIUM 40 MG PO TBEC: 40.0000 mg | DELAYED_RELEASE_TABLET | Freq: Every day | ORAL | Status: DC

## 2011-08-29 MED ORDER — BIOTENE DRY MOUTH MT LIQD
1.0000 "application " | Freq: Four times a day (QID) | OROMUCOSAL | Status: DC
  Administered 2011-08-29 – 2011-09-03 (×18): 15 mL via OROMUCOSAL

## 2011-08-29 MED ORDER — BRIMONIDINE TARTRATE-TIMOLOL 0.2-0.5 % OP SOLN
1.0000 [drp] | Freq: Two times a day (BID) | OPHTHALMIC | Status: DC
  Filled 2011-08-29 (×8): qty 0.1

## 2011-08-29 MED ORDER — NITROGLYCERIN 0.4 MG SL SUBL: 0.4000 mg | SUBLINGUAL_TABLET | SUBLINGUAL | Status: DC | PRN

## 2011-08-29 MED ORDER — SODIUM CHLORIDE 0.9 % IJ SOLN: 3.0000 mL | INTRAMUSCULAR | Status: DC | PRN

## 2011-08-29 MED ORDER — CHLORHEXIDINE GLUCONATE 0.12 % MT SOLN
15.0000 mL | Freq: Two times a day (BID) | OROMUCOSAL | Status: DC
  Administered 2011-08-29 – 2011-08-30 (×2): 15 mL via OROMUCOSAL
  Filled 2011-08-29 (×5): qty 15

## 2011-08-29 MED ORDER — TIMOLOL MALEATE 0.5 % OP SOLN
1.0000 [drp] | Freq: Two times a day (BID) | OPHTHALMIC | Status: DC
  Administered 2011-08-29: 1 [drp] via OPHTHALMIC
  Filled 2011-08-29: qty 5

## 2011-08-29 MED ORDER — FUROSEMIDE 10 MG/ML IJ SOLN
40.0000 mg | Freq: Once | INTRAMUSCULAR | Status: DC
  Filled 2011-08-29: qty 4

## 2011-08-29 MED ORDER — SODIUM CHLORIDE 0.9 % IV SOLN: INTRAVENOUS | Status: DC

## 2011-08-29 MED ORDER — ACETAMINOPHEN 325 MG PO TABS
325.0000 mg | ORAL_TABLET | Freq: Four times a day (QID) | ORAL | Status: DC | PRN
  Administered 2011-08-30: 325 mg via ORAL
  Filled 2011-08-29 (×3): qty 1

## 2011-08-29 MED ORDER — SODIUM CHLORIDE 0.9 % IJ SOLN
3.0000 mL | Freq: Two times a day (BID) | INTRAMUSCULAR | Status: DC
  Administered 2011-08-29: 3 mL via INTRAVENOUS

## 2011-08-29 MED ORDER — METOLAZONE 5 MG PO TABS
5.0000 mg | ORAL_TABLET | ORAL | Status: DC
  Administered 2011-08-29: 5 mg via ORAL
  Filled 2011-08-29: qty 1

## 2011-08-29 MED ORDER — DIVALPROEX SODIUM ER 250 MG PO TB24
250.0000 mg | ORAL_TABLET | Freq: Every day | ORAL | Status: DC
  Administered 2011-08-29 – 2011-09-03 (×6): 250 mg via ORAL
  Filled 2011-08-29 (×6): qty 1

## 2011-08-29 MED ORDER — ROCURONIUM BROMIDE 50 MG/5ML IV SOLN
INTRAVENOUS | Status: AC
  Filled 2011-08-29: qty 2

## 2011-08-29 MED ORDER — VANCOMYCIN HCL IN DEXTROSE 1-5 GM/200ML-% IV SOLN
1000.0000 mg | Freq: Once | INTRAVENOUS | Status: AC
  Administered 2011-08-29: 1000 mg via INTRAVENOUS
  Filled 2011-08-29: qty 200

## 2011-08-29 MED ORDER — TIMOLOL MALEATE 0.5 % OP SOLN
1.0000 [drp] | Freq: Two times a day (BID) | OPHTHALMIC | Status: DC
  Administered 2011-08-29 – 2011-09-03 (×11): 1 [drp] via OPHTHALMIC
  Filled 2011-08-29: qty 5

## 2011-08-29 MED ORDER — MORPHINE SULFATE 4 MG/ML IJ SOLN
INTRAMUSCULAR | Status: AC
  Administered 2011-08-29: 06:00:00
  Filled 2011-08-29: qty 1

## 2011-08-29 MED ORDER — SODIUM CHLORIDE 0.9 % IV SOLN
1250.0000 mg | Freq: Two times a day (BID) | INTRAVENOUS | Status: DC
  Administered 2011-08-29 – 2011-08-30 (×2): 1250 mg via INTRAVENOUS
  Filled 2011-08-29 (×4): qty 1250

## 2011-08-29 MED ORDER — ALLOPURINOL 300 MG PO TABS
300.0000 mg | ORAL_TABLET | Freq: Every day | ORAL | Status: DC
  Filled 2011-08-29: qty 1

## 2011-08-29 MED ORDER — METOPROLOL TARTRATE 1 MG/ML IV SOLN: 5.0000 mg | Freq: Four times a day (QID) | INTRAVENOUS | Status: DC

## 2011-08-29 MED ORDER — SODIUM CHLORIDE 0.9 % IV BOLUS (SEPSIS)
500.0000 mL | Freq: Once | INTRAVENOUS | Status: AC
  Administered 2011-08-29: 500 mL via INTRAVENOUS

## 2011-08-29 MED ORDER — PROPOFOL 10 MG/ML IV EMUL
INTRAVENOUS | Status: AC
  Filled 2011-08-29: qty 50

## 2011-08-29 MED ORDER — PROPOFOL 10 MG/ML IV EMUL
5.0000 ug/kg/min | INTRAVENOUS | Status: DC
  Administered 2011-08-29: 20 ug/kg/min via INTRAVENOUS
  Administered 2011-08-29 – 2011-08-30 (×4): 15 ug/kg/min via INTRAVENOUS
  Filled 2011-08-29 (×4): qty 50

## 2011-08-29 MED ORDER — OLOPATADINE HCL 0.1 % OP SOLN
1.0000 [drp] | Freq: Two times a day (BID) | OPHTHALMIC | Status: DC
  Administered 2011-08-29 – 2011-09-03 (×12): 1 [drp] via OPHTHALMIC
  Filled 2011-08-29: qty 5

## 2011-08-29 MED ORDER — NOREPINEPHRINE BITARTRATE 1 MG/ML IJ SOLN
2.0000 ug/min | INTRAVENOUS | Status: DC
  Administered 2011-08-29: 10 ug/min via INTRAVENOUS
  Filled 2011-08-29 (×2): qty 4

## 2011-08-29 MED ORDER — FUROSEMIDE 40 MG PO TABS
40.0000 mg | ORAL_TABLET | Freq: Two times a day (BID) | ORAL | Status: DC
  Filled 2011-08-29 (×3): qty 1

## 2011-08-29 MED ORDER — BRIMONIDINE TARTRATE 0.2 % OP SOLN
1.0000 [drp] | Freq: Two times a day (BID) | OPHTHALMIC | Status: DC
  Administered 2011-08-29 – 2011-09-03 (×11): 1 [drp] via OPHTHALMIC
  Filled 2011-08-29: qty 5

## 2011-08-29 MED ORDER — ATENOLOL 50 MG PO TABS
50.0000 mg | ORAL_TABLET | Freq: Two times a day (BID) | ORAL | Status: DC
  Administered 2011-08-29: 50 mg via ORAL
  Filled 2011-08-29 (×3): qty 1

## 2011-08-29 MED ORDER — GLIPIZIDE 5 MG PO TABS
5.0000 mg | ORAL_TABLET | Freq: Two times a day (BID) | ORAL | Status: DC
  Filled 2011-08-29 (×2): qty 1

## 2011-08-29 MED ORDER — SUCCINYLCHOLINE CHLORIDE 20 MG/ML IJ SOLN
INTRAMUSCULAR | Status: AC
  Filled 2011-08-29: qty 10

## 2011-08-29 MED ORDER — INSULIN ASPART 100 UNIT/ML ~~LOC~~ SOLN
0.0000 [IU] | SUBCUTANEOUS | Status: DC
  Administered 2011-08-31 (×4): 3 [IU] via SUBCUTANEOUS
  Administered 2011-09-01 (×3): 2 [IU] via SUBCUTANEOUS
  Administered 2011-09-01 (×2): 3 [IU] via SUBCUTANEOUS
  Administered 2011-09-02: 2 [IU] via SUBCUTANEOUS
  Administered 2011-09-02: 3 [IU] via SUBCUTANEOUS
  Administered 2011-09-02: 2 [IU] via SUBCUTANEOUS
  Administered 2011-09-02 (×2): 3 [IU] via SUBCUTANEOUS
  Administered 2011-09-03 (×2): 2 [IU] via SUBCUTANEOUS
  Administered 2011-09-03: 3 [IU] via SUBCUTANEOUS
  Filled 2011-08-29 (×2): qty 3

## 2011-08-29 MED ORDER — HEPARIN SODIUM (PORCINE) 5000 UNIT/ML IJ SOLN
5000.0000 [IU] | Freq: Three times a day (TID) | INTRAMUSCULAR | Status: DC
  Administered 2011-08-29 – 2011-09-02 (×12): 5000 [IU] via SUBCUTANEOUS
  Filled 2011-08-29 (×19): qty 1

## 2011-08-29 MED ORDER — CLOPIDOGREL BISULFATE 75 MG PO TABS
75.0000 mg | ORAL_TABLET | Freq: Every day | ORAL | Status: DC
  Administered 2011-08-29 – 2011-09-03 (×6): 75 mg via ORAL
  Filled 2011-08-29 (×6): qty 1

## 2011-08-29 MED ORDER — SODIUM CHLORIDE 0.9 % IV SOLN: 250.0000 mL | INTRAVENOUS | Status: DC | PRN

## 2011-08-29 MED ORDER — ETOMIDATE 2 MG/ML IV SOLN
INTRAVENOUS | Status: AC
  Filled 2011-08-29: qty 20

## 2011-08-29 MED ORDER — PANTOPRAZOLE SODIUM 40 MG IV SOLR
40.0000 mg | INTRAVENOUS | Status: DC
  Administered 2011-08-29 – 2011-09-01 (×4): 40 mg via INTRAVENOUS
  Filled 2011-08-29 (×4): qty 40

## 2011-08-29 MED ORDER — PIPERACILLIN-TAZOBACTAM 3.375 G IVPB
3.3750 g | Freq: Three times a day (TID) | INTRAVENOUS | Status: DC
  Administered 2011-08-29 – 2011-09-01 (×9): 3.375 g via INTRAVENOUS
  Filled 2011-08-29 (×15): qty 50

## 2011-08-29 MED FILL — Medication: Qty: 1 | Status: AC

## 2011-08-29 NOTE — Significant Event (Signed)
  This note is to document the series of events on behalf of [facility]. I was paged around 6am on 1/24 informing me that the patient had been intubated. Nursing informed me that they had paged our mid level provider twice regarding respiratory distress and hypoxia but had received no call back, and so they had contact respiratory and ICU and the decision had been made to intubate the patient. I came immediately and found the patient intubated, with stable vitals signs. I reviewed his H&P, his CXR showing frank pulmonary edema, and EKG's, and spoke with Dr. Marin Shutter and nursing staff. I also spoke briefly with the patient's wife who had by this time arrived. The patient had been thankfully stabilized and was being admitted to [facility]. I paged and spoke with our midlevel provider and this issue will be handled internally. I appreciate the care that was provided to this patient by nursing, respiratory, and critical care.

## 2011-08-29 NOTE — Consult Note (Signed)
Name: Nicholas [name] MRN: [id number] DOB: 23-Jan-1941    LOS: 1  PCCM ACONSULTATION NOTE  History of Present Illness: 71 yo with multiple comorbidities admitted on 1/23 with left left LE cellulitis.  Developed acute respiratory distress with clinical picture consistent with acute pulmonary edema, intubated, transferred to ICU for farther management.  Lines / Drains: 1/24  ETT 1/24  OGT 1/24  Foley  Cultures: 1/24  Blood>>> 1/24  Urine>>>  Antibiotics: 1/24  Zosyn>>> 1/24  Vancomycin>>>  Tests / Events: 1/24  CXR>>>acute pulmonary edema  The patient is sedated, intubated and unable to provide history, which was obtained for available medical records.    Past Medical History  Diagnosis Date  . CHF (congestive heart failure)   . Hypertension   . Diabetes mellitus   . CVA (cerebral infarction)   . Epilepsy   . Hx of CABG   . Peripheral vascular disease   . Ulcer     left foot  . Cellulitis   . Chronic kidney disease   . Dyslipidemia   . Chronic venous insufficiency   . Sleep apnea   . Morbid obesity   . Diverticulosis   . Benign prostatic hypertrophy   . Glaucoma   . Staphylococcus aureus infection   . Gout   . Depression   . Insomnia   . Reflux   . Coronary artery disease    Past Surgical History  Procedure Date  . Knee surgery     bilateral  . Aortogram w/ angioplasty 05/07/11  . Coronary artery bypass graft     x7 vessels   Prior to Admission medications   Medication Sig Start Date End Date Taking? Authorizing Provider  allopurinol (ZYLOPRIM) 300 MG tablet Take 300 mg by mouth daily.     Yes Historical Provider, MD  ALPRAZolam Prudy Feeler) 1 MG tablet Take 1-2 mg by mouth 3 (three) times daily. Takes 1 tablet in the morning and at 430 pm and 2 tablets at bedtime.   Yes Historical Provider, MD  atenolol (TENORMIN) 50 MG tablet Take 50 mg by mouth 2 (two) times daily.    Yes Historical Provider, MD  brimonidine-timolol (COMBIGAN) 0.2-0.5 % ophthalmic solution  Place 1 drop into both eyes every 12 (twelve) hours.     Yes Historical Provider, MD  clopidogrel (PLAVIX) 75 MG tablet Take 75 mg by mouth daily.     Yes Historical Provider, MD  divalproex (DEPAKOTE) 250 MG 24 hr tablet Take 250 mg by mouth daily.     Yes Historical Provider, MD  furosemide (LASIX) 80 MG tablet Take 80 mg by mouth 2 (two) times daily.     Yes Historical Provider, MD  glipiZIDE (GLUCOTROL) 5 MG tablet Take 5 mg by mouth 2 (two) times daily before a meal.     Yes Historical Provider, MD  HYDROcodone-acetaminophen (VICODIN ES) 7.5-750 MG per tablet Take 1 tablet by mouth every 6 (six) hours as needed. For pain   Yes Historical Provider, MD  insulin glargine (LANTUS) 100 UNIT/ML injection Inject 5-10 Units into the skin at bedtime as needed.    Yes Historical Provider, MD  latanoprost (XALATAN) 0.005 % ophthalmic solution Place 1 drop into both eyes at bedtime.     Yes Historical Provider, MD  metolazone (ZAROXOLYN) 5 MG tablet Take 5 mg by mouth 2 (two) times a week. Takes on Mondays and Fridays   Yes Historical Provider, MD  olopatadine (PATANOL) 0.1 % ophthalmic solution Place 1 drop into both eyes  2 (two) times daily.     Yes Historical Provider, MD  omeprazole (PRILOSEC) 20 MG capsule Take 20 mg by mouth 2 (two) times daily as needed. For heart burn   Yes Historical Provider, MD  simvastatin (ZOCOR) 20 MG tablet Take 20 mg by mouth daily.    Yes Historical Provider, MD  temazepam (RESTORIL) 30 MG capsule Take 30 mg by mouth at bedtime.    Yes Historical Provider, MD  timolol (BETIMOL) 0.5 % ophthalmic solution Place 1 drop into the left eye 2 (two) times daily.     Yes Historical Provider, MD  nitroGLYCERIN (NITROSTAT) 0.4 MG SL tablet Place 0.4 mg under the tongue every 5 (five) minutes as needed. For chest pain    Historical Provider, MD   Allergies No Known Allergies  Family History Family History  Problem Relation Age of Onset  . Hypertension Other   . Cancer Other     Social History  reports that he quit smoking about 20 years ago. His smoking use included Cigarettes. He has never used smokeless tobacco. He reports that he does not drink alcohol or use illicit drugs.  Review Of Systems  11 points review of systems is negative with an exception of listed in HPI.  Vital Signs: Temp:  [99.1 F (37.3 C)-103.8 F (39.9 C)] 99.1 F (37.3 C) (01/23 2350) Pulse Rate:  [68-92] 92  (01/24 0120) Resp:  [18] 18  (01/23 2350) BP: (120-190)/(43-80) 120/63 mmHg (01/24 0120) SpO2:  [90 %-99 %] 92 % (01/23 2350) Weight:  [128.3 kg (282 lb 13.6 oz)] 128.3 kg (282 lb 13.6 oz) (01/24 0228)   Physical Examination: General:  Mechanically ventilated, no acute distress Neuro:  Sedated, synchronous, non focal HEENT:  PERRL, ETT, OGT Neck:  Cannot assess for JVD Cardiovascular:  RRR, no murmurs Lungs:  Bilateral diminished air entry, diffused rales Abdomen:  Obese, bowel sounds present Musculoskeletal:  [place] Skin:  L foot diabetic ulcer, L LE edematous / erythematous  Ventilator settings:   Labs and Imaging:  Reviewed.  Please refer to the Assessment and Plan section for relevant results.  Assessment and Plan:  Encephalopathy secondary to sedation -->  Propofol for synchrony -->  Daily WUA  Acute respiratory failure secondary to acute pulmonary edema  Lab 08/29/11 0618  PHART 7.276*  PCO2ART 58.0*  PO2ART 154.0*  HCO3 25.2*  TCO2 22.4  O2SAT 98.7   -->  Full mechanical support -->  goal pH > 7.30, goal SpO2 > 92 -->  f/u ABG -->  f/u CXR -->  daily SBT  Acute pulmonary edema secondary to hypertensive emergency (SBP 220 at the time of initial presentation).  Coronary artery disease / hypertension.  ECG>>>LV strain vs ischemia.  Given Lasix / Morphine before intubation. -->  Lasix IV / Zaroxolyn -->  ASA / Plavix / Simvastatin -->  Metoprolol 5 mg IV q6h -->  Cardiology consult -->  Cycle cardiac enzymes -->  2D ECHO   Left lower  extremity cellulitis, diabetic ulcer  Lab 08/29/11 0330 08/29/11 0050 08/28/11 2030  WBC 15.8* 14.1* 16.3*   -->  Vanco / Zosyn -->  Venous Doppler -->  Wound care consult  Diabetes -->  Hold oral agents / Lantus -->  SSI  Seizure disorder, no active seizures -->  Continue Depakote  Best practices / Disposition -->  ICU status under PCCM -->  Full code -->  Heparin for DVT Px -->  Protonix for GI Px -->  Ventilator bundle -->  NPO -->  Family updated at bedside  The patient is critically ill with multiple organ systems failure and requires high complexity decision making for assessment and support, frequent evaluation and titration of therapies, application of advanced monitoring technologies and extensive interpretation of multiple databases. Critical Care Time devoted to patient care services described in this note is 45 minutes.  Orlean Bradford, M.D. Pulmonary and Critical Care Medicine [facility] Cell: [phone] Pager: [phone]  08/29/2011, 6:21 AM

## 2011-08-29 NOTE — Consult Note (Signed)
Admit date: 08/28/2011 Name: Nicholas [name] DOB:  07/02/1941 MRN:  [id number] Age:  71 y.o.  Referring Physician:  Critical care medicine Primary Physician:  Dr. Johnella Moloney Primary Cardiologist:  Dr. Viann Fish  Reason for Consultation:  Pulmonary edema  IMPRESSIONS:  1. Pulmonary edema multifactorial due to hypertensive urgency and demand ischemia 2. Sepsis syndrome 3. Cellultis 4. Coronary artery disease With previous redo bypass grafting not a candidate for additional surgery 5. Abnormal troponin and CPK-MB likely due to demand ischemia 6. Morbid obesity 7. Foot ulcer and sacral decubitus 8. History of PAD 9. Diabetes mellitus 10. Hypertensive heart disease  RECOMMENDATION:  I reviewed the EKG and the patient is not a candidate for acute cardiac intervention. I think that he has significant sepsis and is hypotensive at this time and is being managed by critical care. I will review his echocardiogram but at the present time think he should have supportive care. I would not consider him to be a good candidate for aggressive cardiac intervention. He has had a very poor quality of life at home and after discussion with the wife would wonder whether there may need to be some discussion about what quality of life Issues would pertain here and what the best course of aggressive care would be.  I recommend continued supportive care, treatment of sepsis, and will be happy to support as needed.  HISTORY: This 71 year old male has a previous history of coronary artery disease, morbid obesity and diabetes. He has had previous redo bypass grafting. He has had significant issues with cellulitis and diabetic ulcers and has had methicillin-resistant staph with multiple hospitalizations this year.  He was in the hospital over Christmas and his not felt well since then and has had nausea and vomiting and felt poorly for several weeks now. He was readmitted yesterday and developed acute  respiratory distress last night and was transferred to the intensive care unit and was intubated. There was a question of ST elevation on his EKG and cardiology was consulted. He had marked elevations of blood pressure earlier which have now come down. He is now intubated and we're unable to obtain a history on him except from the wife.   Past Medical History  Diagnosis Date  . CHF (congestive heart failure)   . Hypertension   . Diabetes mellitus   . CVA (cerebral infarction)   . Epilepsy   . Hx of CABG   . Peripheral vascular disease   . Ulcer     left foot  . Cellulitis   . Chronic kidney disease   . Dyslipidemia   . Chronic venous insufficiency   . Sleep apnea   . Morbid obesity   . Diverticulosis   . Benign prostatic hypertrophy   . Glaucoma   . Staphylococcus aureus infection   . Gout   . Depression   . Insomnia   . Reflux   . Coronary artery disease   . CAD (coronary artery disease), native coronary artery       Past Surgical History  Procedure Date  . Coronary artery bypass graft     1992, redo in 1983  . Appendectomy   . Cholecystectomy, laparoscopic   . Lumbar laminectomy   . Hernia repair   . Mastoid debridement   . Knee and ankle surgery    Allergies:   has no known allergies.   Medications: Prior to Admission medications   Medication Sig Start Date End Date Taking? Authorizing Provider  allopurinol (ZYLOPRIM)  300 MG tablet Take 300 mg by mouth daily.     Yes Historical Provider, MD  ALPRAZolam Prudy Feeler) 1 MG tablet Take 1-2 mg by mouth 3 (three) times daily. Takes 1 tablet in the morning and at 430 pm and 2 tablets at bedtime.   Yes Historical Provider, MD  atenolol (TENORMIN) 50 MG tablet Take 50 mg by mouth 2 (two) times daily.    Yes Historical Provider, MD  brimonidine-timolol (COMBIGAN) 0.2-0.5 % ophthalmic solution Place 1 drop into both eyes every 12 (twelve) hours.     Yes Historical Provider, MD  clopidogrel (PLAVIX) 75 MG tablet Take 75 mg by  mouth daily.     Yes Historical Provider, MD  divalproex (DEPAKOTE) 250 MG 24 hr tablet Take 250 mg by mouth daily.     Yes Historical Provider, MD  furosemide (LASIX) 80 MG tablet Take 80 mg by mouth 2 (two) times daily.     Yes Historical Provider, MD  glipiZIDE (GLUCOTROL) 5 MG tablet Take 5 mg by mouth 2 (two) times daily before a meal.     Yes Historical Provider, MD  HYDROcodone-acetaminophen (VICODIN ES) 7.5-750 MG per tablet Take 1 tablet by mouth every 6 (six) hours as needed. For pain   Yes Historical Provider, MD  insulin glargine (LANTUS) 100 UNIT/ML injection Inject 5-10 Units into the skin at bedtime as needed.    Yes Historical Provider, MD  latanoprost (XALATAN) 0.005 % ophthalmic solution Place 1 drop into both eyes at bedtime.     Yes Historical Provider, MD  metolazone (ZAROXOLYN) 5 MG tablet Take 5 mg by mouth 2 (two) times a week. Takes on Mondays and Fridays   Yes Historical Provider, MD  olopatadine (PATANOL) 0.1 % ophthalmic solution Place 1 drop into both eyes 2 (two) times daily.     Yes Historical Provider, MD  omeprazole (PRILOSEC) 20 MG capsule Take 20 mg by mouth 2 (two) times daily as needed. For heart burn   Yes Historical Provider, MD  simvastatin (ZOCOR) 20 MG tablet Take 20 mg by mouth daily.    Yes Historical Provider, MD  temazepam (RESTORIL) 30 MG capsule Take 30 mg by mouth at bedtime.    Yes Historical Provider, MD  timolol (BETIMOL) 0.5 % ophthalmic solution Place 1 drop into the left eye 2 (two) times daily.     Yes Historical Provider, MD  nitroGLYCERIN (NITROSTAT) 0.4 MG SL tablet Place 0.4 mg under the tongue every 5 (five) minutes as needed. For chest pain    Historical Provider, MD    Social History:   reports that he quit smoking about 20 years ago. His smoking use included Cigarettes. He has never used smokeless tobacco. He reports that he does not drink alcohol or use illicit drugs.   History   Social History Narrative  . No narrative on file     Review of Systems: Not obtainable  Physical Exam: Blood pressure 95/44, pulse 70, temperature 100.9 F (38.3 C), temperature source Core (Comment), resp. rate 15, height 5\' 9"  (1.753 m), weight 128.3 kg (282 lb 13.6 oz), SpO2 96.00%.    General appearance: elderly severely obese [place] male currently intubated and unresponsive Head: Normocephalic, without obvious abnormality, atraumatic Eyes: negative, conjunctivae/corneas clear. PERRL, EOM's intact. Fundi benign. Neck: very large, unable to assess JVD Lungs: diminished breath sounds bilaterally Heart: regular rate and rhythm, S1, S2 normal and no S3 or S4 Abdomen: extremely obese, no definite tenderness Rectal: deferred Extremities: left leg with previous  saphenous vein harvesting scars, significant cellulitis involving leg, 4+ edema of both legs, pedal pulses extremely difficult to feel. Pulses: unable to assess Neurologic: Grossly normal  Labs:   Results for orders placed during the hospital encounter of 08/28/11 (from the past 24 hour(s))  CBC     Status: Abnormal   Collection Time   08/28/11  8:30 PM      Component Value Range   WBC 16.3 (*) 4.0 - 10.5 (K/uL)   RBC 4.83  4.22 - 5.81 (MIL/uL)   Hemoglobin 14.7  13.0 - 17.0 (g/dL)   HCT 16.1  09.6 - 04.5 (%)   MCV 95.2  78.0 - 100.0 (fL)   MCH 30.4  26.0 - 34.0 (pg)   MCHC 32.0  30.0 - 36.0 (g/dL)   RDW 40.9  81.1 - 91.4 (%)   Platelets 172  150 - 400 (K/uL)  DIFFERENTIAL     Status: Abnormal   Collection Time   08/28/11  8:30 PM      Component Value Range   Neutrophils Relative 89 (*) 43 - 77 (%)   Neutro Abs 14.5 (*) 1.7 - 7.7 (K/uL)   Lymphocytes Relative 5 (*) 12 - 46 (%)   Lymphs Abs 0.9  0.7 - 4.0 (K/uL)   Monocytes Relative 5  3 - 12 (%)   Monocytes Absolute 0.9  0.1 - 1.0 (K/uL)   Eosinophils Relative 0  0 - 5 (%)   Eosinophils Absolute 0.0  0.0 - 0.7 (K/uL)   Basophils Relative 0  0 - 1 (%)   Basophils Absolute 0.0  0.0 - 0.1 (K/uL)  COMPREHENSIVE  METABOLIC PANEL     Status: Abnormal   Collection Time   08/28/11  8:30 PM      Component Value Range   Sodium 137  135 - 145 (mEq/L)   Potassium 5.3 (*) 3.5 - 5.1 (mEq/L)   Chloride 100  96 - 112 (mEq/L)   CO2 29  19 - 32 (mEq/L)   Glucose, Bld 108 (*) 70 - 99 (mg/dL)   BUN 13  6 - 23 (mg/dL)   Creatinine, Ser 7.82  0.50 - 1.35 (mg/dL)   Calcium 9.3  8.4 - 95.6 (mg/dL)   Total Protein 7.3  6.0 - 8.3 (g/dL)   Albumin 3.3 (*) 3.5 - 5.2 (g/dL)   AST 13  0 - 37 (U/L)   ALT 9  0 - 53 (U/L)   Alkaline Phosphatase 97  39 - 117 (U/L)   Total Bilirubin 0.3  0.3 - 1.2 (mg/dL)   GFR calc non Af Amer 70 (*) >90 (mL/min)   GFR calc Af Amer 81 (*) >90 (mL/min)  LIPASE, BLOOD     Status: Normal   Collection Time   08/28/11  8:30 PM      Component Value Range   Lipase 20  11 - 59 (U/L)  HEMOGLOBIN A1C     Status: Abnormal   Collection Time   08/28/11  8:30 PM      Component Value Range   Hemoglobin A1C 6.2 (*) <5.7 (%)   Mean Plasma Glucose 131 (*) <117 (mg/dL)  LACTIC ACID, PLASMA     Status: Normal   Collection Time   08/28/11  8:50 PM      Component Value Range   Lactic Acid, Venous 2.1  0.5 - 2.2 (mmol/L)  URINALYSIS, ROUTINE W REFLEX MICROSCOPIC     Status: Abnormal   Collection Time   08/28/11  8:55 PM  Component Value Range   Color, Urine YELLOW  YELLOW    APPearance CLEAR  CLEAR    Specific Gravity, Urine 1.015  1.005 - 1.030    pH 7.5  5.0 - 8.0    Glucose, UA NEGATIVE  NEGATIVE (mg/dL)   Hgb urine dipstick TRACE (*) NEGATIVE    Bilirubin Urine NEGATIVE  NEGATIVE    Ketones, ur NEGATIVE  NEGATIVE (mg/dL)   Protein, ur >161 (*) NEGATIVE (mg/dL)   Urobilinogen, UA 0.2  0.0 - 1.0 (mg/dL)   Nitrite NEGATIVE  NEGATIVE    Leukocytes, UA NEGATIVE  NEGATIVE   URINE MICROSCOPIC-ADD ON     Status: Abnormal   Collection Time   08/28/11  8:55 PM      Component Value Range   Squamous Epithelial / LPF RARE  RARE    RBC / HPF 3-6  <3 (RBC/hpf)   Casts HYALINE CASTS (*) NEGATIVE     CBC     Status: Abnormal   Collection Time   08/29/11 12:50 AM      Component Value Range   WBC 14.1 (*) 4.0 - 10.5 (K/uL)   RBC 4.26  4.22 - 5.81 (MIL/uL)   Hemoglobin 12.5 (*) 13.0 - 17.0 (g/dL)   HCT 09.6  04.5 - 40.9 (%)   MCV 94.1  78.0 - 100.0 (fL)   MCH 29.3  26.0 - 34.0 (pg)   MCHC 31.2  30.0 - 36.0 (g/dL)   RDW 81.1  91.4 - 78.2 (%)   Platelets 143 (*) 150 - 400 (K/uL)  CREATININE, SERUM     Status: Abnormal   Collection Time   08/29/11 12:50 AM      Component Value Range   Creatinine, Ser 1.10  0.50 - 1.35 (mg/dL)   GFR calc non Af Amer 67 (*) >90 (mL/min)   GFR calc Af Amer 77 (*) >90 (mL/min)  CBC     Status: Abnormal   Collection Time   08/29/11  3:30 AM      Component Value Range   WBC 15.8 (*) 4.0 - 10.5 (K/uL)   RBC 4.44  4.22 - 5.81 (MIL/uL)   Hemoglobin 13.7  13.0 - 17.0 (g/dL)   HCT 95.6  21.3 - 08.6 (%)   MCV 95.0  78.0 - 100.0 (fL)   MCH 30.9  26.0 - 34.0 (pg)   MCHC 32.5  30.0 - 36.0 (g/dL)   RDW 57.8 (*) 46.9 - 15.5 (%)   Platelets 142 (*) 150 - 400 (K/uL)  BASIC METABOLIC PANEL     Status: Abnormal   Collection Time   08/29/11  3:30 AM      Component Value Range   Sodium 135  135 - 145 (mEq/L)   Potassium 5.4 (*) 3.5 - 5.1 (mEq/L)   Chloride 101  96 - 112 (mEq/L)   CO2 26  19 - 32 (mEq/L)   Glucose, Bld 68 (*) 70 - 99 (mg/dL)   BUN 14  6 - 23 (mg/dL)   Creatinine, Ser 6.29  0.50 - 1.35 (mg/dL)   Calcium 8.8  8.4 - 52.8 (mg/dL)   GFR calc non Af Amer 73 (*) >90 (mL/min)   GFR calc Af Amer 85 (*) >90 (mL/min)  BLOOD GAS, ARTERIAL     Status: Abnormal   Collection Time   08/29/11  6:18 AM      Component Value Range   FIO2 1.00     Delivery systems VENTILATOR     Mode PRESSURE REGULATED  VOLUME CONTROL     VT 600     Rate 15     Peep/cpap 5.0     pH, Arterial 7.276 (*) 7.350 - 7.450    pCO2 arterial 58.0 (*) 35.0 - 45.0 (mmHg)   pO2, Arterial 154.0 (*) 80.0 - 100.0 (mmHg)   Bicarbonate 25.2 (*) 20.0 - 24.0 (mEq/L)   TCO2 22.4  0 - 100  (mmol/L)  AMYLASE     Status: Normal   Collection Time   08/29/11 11:30 AM      Component Value Range   Amylase 21  0 - 105 (U/L)  LIPASE, BLOOD     Status: Normal   Collection Time   08/29/11 11:30 AM      Component Value Range   Lipase 15  11 - 59 (U/L)  PROCALCITONIN     Status: Normal   Collection Time   08/29/11 11:30 AM      Component Value Range   Procalcitonin 31.09    LACTIC ACID, PLASMA     Status: Normal   Collection Time   08/29/11 11:30 AM      Component Value Range   Lactic Acid, Venous 1.2  0.5 - 2.2 (mmol/L)  CARDIAC PANEL(CRET KIN+CKTOT+MB+TROPI)     Status: Abnormal   Collection Time   08/29/11 12:00 PM      Component Value Range   Total CK 148  7 - 232 (U/L)   CK, MB 10.8 (*) 0.3 - 4.0 (ng/mL)   Troponin I 6.76 (*) <0.30 (ng/mL)   Relative Index 7.3 (*) 0.0 - 2.5      Radiology: Pulmonary edema, cardiomegaly  EKG: Initial EKG with Q waves in V2 questionable hyperacute T waves, no definite ST elevation MI. Sinus rhythm.    Signed:  Darden Palmer MD [facility]   Cardiology Consultant 08/29/2011, 5:09 PM

## 2011-08-29 NOTE — Consult Note (Signed)
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 Patient is on a low air loss sleep surface in the ICU (will be today) and should be maintained on this type of sleep surface when transferred to the floor. I will not follow.  Please re-consult if needed. Thanks, Ladona Mow, MSN, RN, [facility], CWOCN [phone])

## 2011-08-29 NOTE — Procedures (Signed)
Arterial Catheter Insertion Procedure Note Nicholas [name] [id number] 15-Nov-1940  Procedure: Insertion of Arterial Catheter  Indications: Blood pressure monitoring  Procedure Details Consent: Risks of procedure as well as the alternatives and risks of each were explained to the (patient/caregiver).  Consent for procedure obtained. Time Out: Verified patient identification, verified procedure, site/side was marked, verified correct patient position, special equipment/implants available, medications/allergies/relevent history reviewed, required imaging and test results available.  Performed  Maximum sterile technique was used including cap, gloves, gown, hand hygiene, mask and sheet. Skin prep: Chlorhexidine; local anesthetic administered 22 gauge catheter was inserted into left radial artery using the Seldinger technique.  Evaluation Blood flow good; BP tracing good. Complications: No apparent complications.   Nicholas [name],PETE 08/29/2011

## 2011-08-29 NOTE — Progress Notes (Signed)
Patient seen initially this am.  Chart reviewed.  I don't think he is having ST elevation MI and anyway don't feel he would be a candidate for acute intervention b/o comorbidities. Has have CABG x 2 previously.   Full note to follow.  Check ECHO

## 2011-08-29 NOTE — Significant Event (Signed)
Rapid Response Event Note  Overview: Time Called: 0510 Arrival Time: 0515 Event Type: Respiratory  Initial Focused Assessment: Pt obtunded and labored irregular respirations. Unable to verbalize and pt wife at bedside. Pt hypertensive and sinus tach. With sats 91 on NRB. Color dusky and mottled. Discussed pt wishes with his wife and Md called to assess and transfer to ICU.   Interventions: Pt intubated --received Morphine 4mg  etomidate 20mg  and lasix 40mg . Cxr done prior to MD arrival --breath sounds auscultated and CO2 detector checked. Diprivan started bp dropped after initial intubation but normalized after transfer.    Event Summary: Name of Physician Notified: Leim Fabry NP via text at [phone]  Name of Consulting Physician Notified: Dr. Marin Shutter at 0530  Outcome: Transferred (Comment) (intubated and moved to 1238)  Event End Time: 0615  Bayard Hugger

## 2011-08-29 NOTE — Progress Notes (Signed)
Subjective: Patient admitted last night for cellulitis, recurrent. Had episode of severe shortness of breath this a.m. and became extremely hypoxemic and cyanotic and mottled. Decision was made to intubate the patient for further elucidation of cause for respiratory arrest. His wife, Nicholas [name], is at bedside and concurred with the decision for intubation  Objective: Weight change:  No intake or output data in the 24 hours ending 08/29/11 0725  Filed Vitals:   08/28/11 2258 08/28/11 2350 08/29/11 0120 08/29/11 0228  BP:  120/63 120/63   Pulse:  68 92   Temp: 101.7 F (38.7 C) 99.1 F (37.3 C)    TempSrc: Rectal Oral    Resp:  18    Height:    5\' 9"  (1.753 m)  Weight:    128.3 kg (282 lb 13.6 oz)  SpO2:  92%     General: Mechanically ventilated, no acute distress  Neuro: Sedated, synchronous, non focal  HEENT: PERRL, ETT, OGT  Neck: Cannot assess for JVD  Cardiovascular: RRR, no murmurs  Lungs: Bilateral diminished air entry, diffused rales  Abdomen: Obese, bowel sounds present  Musculoskeletal: Anasasrca  Skin: L foot diabetic ulcer, L LE edematous / erythematous  Lab Results:  Basename 08/29/11 0330 08/29/11 0050 08/28/11 2030  NA 135 -- 137  K 5.4* -- 5.3*  CL 101 -- 100  CO2 26 -- 29  GLUCOSE 68* -- 108*  BUN 14 -- 13  CREATININE 1.02 1.10 --  CALCIUM 8.8 -- 9.3  MG -- -- --  PHOS -- -- --    Basename 08/28/11 2030  AST 13  ALT 9  ALKPHOS 97  BILITOT 0.3  PROT 7.3  ALBUMIN 3.3*    Basename 08/28/11 2030  LIPASE 20  AMYLASE --    Basename 08/29/11 0330 08/29/11 0050 08/28/11 2030  WBC 15.8* 14.1* --  NEUTROABS -- -- 14.5*  HGB 13.7 12.5* --  HCT 42.2 40.1 --  MCV 95.0 94.1 --  PLT 142* 143* --   No results found for this basename: CKTOTAL:3,CKMB:3,CKMBINDEX:3,TROPONINI:3 in the last 72 hours No components found with this basename: POCBNP:3 No results found for this basename: DDIMER:2 in the last 72 hours  Basename 08/28/11 2030  HGBA1C 6.2*   No  results found for this basename: CHOL:2,HDL:2,LDLCALC:2,TRIG:2,CHOLHDL:2,LDLDIRECT:2 in the last 72 hours No results found for this basename: TSH,T4TOTAL,FREET3,T3FREE,THYROIDAB in the last 72 hours No results found for this basename: VITAMINB12:2,FOLATE:2,FERRITIN:2,TIBC:2,IRON:2,RETICCTPCT:2 in the last 72 hours  Studies/Results: Dg Chest 2 View  08/28/2011  *RADIOLOGY REPORT*  Clinical Data: Fever and shortness of breath.  Weakness.  CHEST - 2 VIEW  Comparison: Portable chest 07/29/2011.  Findings: The heart is enlarged.  Mild edema is present. No focal airspace disease is evident.  The lateral view is obscured by motion.  Mild degenerative changes of the thoracic spine are stable.  IMPRESSION:  1.  Cardiomegaly and mild edema, compatible with congestive heart failure.  Original Report Authenticated By: Jamesetta Orleans. MATTERN, M.D.   Dg Chest Port 1 View  08/29/2011  *RADIOLOGY REPORT*  Clinical Data: Respiratory distress; status post intubation.  PORTABLE CHEST - 1 VIEW  Comparison: Chest radiograph performed earlier today at 05:24 a.m.  Findings: The patient's endotracheal tube is seen ending 2 cm above the carina.  An enteric tube is noted apparently ending at the mid esophagus.  Persistent bilateral airspace opacities raise concern for pulmonary edema.  Small bilateral pleural effusions are again seen.  No pneumothorax is identified.  The cardiomediastinal silhouette is enlarged; the  patient is status post median sternotomy, with evidence of prior CABG.  No acute osseous abnormalities are seen.  IMPRESSION:  1.  Endotracheal tube seen ending 2 cm above the carina. 2.  Enteric tube appears to end at the mid esophagus. 3.  Persistent pulmonary edema, slightly less prominent than on the prior study; small bilateral pleural effusions again seen. 4.  Cardiomegaly again noted.  Original Report Authenticated By: Tonia Ghent, M.D.   Dg Chest Port 1 View  08/29/2011  *RADIOLOGY REPORT*  Clinical Data:  Respiratory distress.  PORTABLE CHEST - 1 VIEW  Comparison: Chest radiograph performed 08/28/2011  Findings: The lungs are well-aerated.  Significant vascular congestion is noted, with diffuse bilateral airspace opacities, concerning for significant pulmonary edema.  Small bilateral pleural effusions are noted.  No pneumothorax is seen.  The cardiomediastinal silhouette is mildly enlarged; the patient is status post median sternotomy.  No acute osseous abnormalities are seen.  IMPRESSION: Significant vascular congestion and mild cardiomegaly, with diffuse bilateral airspace opacities, concerning for significant pulmonary edema.  Small bilateral pleural effusions seen.  Findings were discussed with Maralyn Sago RN in the [place] [facility] at 06:05 a.m. on 08/29/2011.  Original Report Authenticated By: Tonia Ghent, M.D.   Dg Foot Complete Left  08/28/2011  *RADIOLOGY REPORT*  Clinical Data: Wound of the plantar surface left foot.  Question osteomyelitis.  LEFT FOOT - COMPLETE 3+ VIEW  Comparison: Plain films left foot 07/28/2010.  Findings: Soft tissues of the foot are massively swollen.  No plain film evidence of osteomyelitis is identified.  Midfoot degenerative disease or Charcot change has progressed.  Plantar calcaneal spur noted.  IMPRESSION: Massive soft tissue swelling.  No plain film evidence of osteomyelitis.  Original Report Authenticated By: Bernadene Bell. Maricela Curet, M.D.   Medications: Scheduled Meds:   . acetaminophen  650 mg Oral Once  . antiseptic oral rinse  1 application Mouth Rinse QID  . aspirin  81 mg Oral Daily  . brimonidine  1 drop Both Eyes BID  . chlorhexidine  15 mL Mouth/Throat BID  . clopidogrel  75 mg Oral Daily  . divalproex  250 mg Oral Daily  . furosemide  40 mg Intravenous Q12H  . heparin  5,000 Units Subcutaneous Q8H  . ibuprofen  800 mg Oral Once  . insulin aspart  0-15 Units Subcutaneous Q4H  . latanoprost  1 drop Both Eyes QHS  . metolazone  5 mg Oral 2 times weekly  .  metoprolol  5 mg Intravenous Q6H  . morphine      . olopatadine  1 drop Both Eyes BID  . pantoprazole (PROTONIX) IV  40 mg Intravenous Q24H  . piperacillin-tazobactam (ZOSYN)  IV  3.375 g Intravenous Q8H  . simvastatin  20 mg Oral Daily  . sodium chloride  1,000 mL Intravenous Once  . sodium chloride  3 mL Intravenous Q12H  . timolol  1 drop Both Eyes BID  . vancomycin  1,250 mg Intravenous Q12H  . vancomycin  1,000 mg Intravenous Once  . vancomycin  1,000 mg Intravenous Once  . DISCONTD: allopurinol  300 mg Oral Daily  . DISCONTD: atenolol  50 mg Oral BID  . DISCONTD: brimonidine-timolol  1 drop Both Eyes Q12H  . DISCONTD: furosemide  40 mg Intravenous Once  . DISCONTD: furosemide  40 mg Oral BID  . DISCONTD: glipiZIDE  5 mg Oral BID AC  . DISCONTD: insulin aspart  0-15 Units Subcutaneous TID WC  . DISCONTD: insulin glargine  5 Units  Subcutaneous QHS  . DISCONTD: pantoprazole  40 mg Oral Q1200  . DISCONTD: sodium polystyrene  15 g Oral Once  . DISCONTD: temazepam  30 mg Oral QHS  . DISCONTD: timolol  1 drop Left Eye BID   Continuous Infusions:   . propofol     PRN Meds:.sodium chloride, acetaminophen, sodium chloride, DISCONTD: ALPRAZolam, DISCONTD: HYDROcodone-acetaminophen, DISCONTD: nitroGLYCERIN  Assessment/Plan: Patient Active Problem List  Diagnoses Date Noted  . Diabetic foot ulcer 08/28/2011  . Cellulitis 07/30/2011  . SIRS (systemic inflammatory response syndrome) 07/29/2011  . Lactic acidosis 07/29/2011  . AKI (acute kidney injury) 07/29/2011  . Fever 07/29/2011  . CHF (congestive heart failure) 07/29/2011  . DM (diabetes mellitus) 07/29/2011  . HTN (hypertension) 07/29/2011  . PAD (peripheral artery disease) 07/29/2011  . Anxiety 07/29/2011  . Glaucoma 07/29/2011  . HLD (hyperlipidemia) 07/29/2011  . BPH (benign prostatic hyperplasia) 07/29/2011  . Atherosclerosis of native arteries of the extremities with ulceration 04/29/2011  .  as per critical care   08/24/2009     LOS: 1 day   Quran Vasco NEVILL 08/29/2011, 7:25 AM

## 2011-08-29 NOTE — Procedures (Signed)
Central Venous Catheter Insertion Procedure Note Nicholas [name] [id number] 07-25-1941      Procedure: Insertion of Central Venous Catheter Indications: Assessment of intravascular volume and Drug and/or fluid administration  Procedure Details Consent: Risks of procedure as well as the alternatives and risks of each were explained to the (patient/caregiver).  Consent for procedure obtained. Time Out: Verified patient identification, verified procedure, site/side was marked, verified correct patient position, special equipment/implants available, medications/allergies/relevent history reviewed, required imaging and test results available.  Performed  Maximum sterile technique was used including antiseptics, cap, gloves, gown, hand hygiene, mask and sheet. Skin prep: Chlorhexidine; local anesthetic administered A antimicrobial bonded/coated triple lumen catheter was placed in the left internal jugular vein using the Seldinger technique.  Evaluation Blood flow good Complications: No apparent complications Patient did tolerate procedure well. Chest X-ray ordered to verify placement.  CXR: normal.  Nicholas [name],PETE 08/29/2011, 11:37 AM

## 2011-08-29 NOTE — Progress Notes (Signed)
Troponin of 6.76 relayed to Anders Simmonds, NP.

## 2011-08-29 NOTE — Progress Notes (Signed)
  Echocardiogram 2D Echocardiogram has been performed.  Jorje Guild [facility] 08/29/2011, 10:05 AM

## 2011-08-29 NOTE — Progress Notes (Signed)
INITIAL ADULT NUTRITION ASSESSMENT Date: 08/29/2011   Time: 1:52 PM Reason for Assessment: vent  ASSESSMENT: Male 71 y.o.  Dx: Cellulitis  Hx:  Past Medical History  Diagnosis Date  . CHF (congestive heart failure)   . Hypertension   . Diabetes mellitus   . CVA (cerebral infarction)   . Epilepsy   . Hx of CABG   . Peripheral vascular disease   . Ulcer     left foot  . Cellulitis   . Chronic kidney disease   . Dyslipidemia   . Chronic venous insufficiency   . Sleep apnea   . Morbid obesity   . Diverticulosis   . Benign prostatic hypertrophy   . Glaucoma   . Staphylococcus aureus infection   . Gout   . Depression   . Insomnia   . Reflux   . Coronary artery disease   . CAD (coronary artery disease), native coronary artery    Past Surgical History  Procedure Date  . Knee surgery     bilateral  . Aortogram w/ angioplasty 05/07/11  . Coronary artery bypass graft     x7 vessels    Related Meds:  Scheduled Meds:   . acetaminophen  650 mg Oral Once  . antiseptic oral rinse  1 application Mouth Rinse QID  . aspirin  81 mg Oral Daily  . brimonidine  1 drop Both Eyes BID  . chlorhexidine  15 mL Mouth/Throat BID  . clopidogrel  75 mg Oral Daily  . divalproex  250 mg Oral Daily  . etomidate      . heparin  5,000 Units Subcutaneous Q8H  . ibuprofen  800 mg Oral Once  . insulin aspart  0-15 Units Subcutaneous Q4H  . latanoprost  1 drop Both Eyes QHS  . lidocaine (cardiac) 100 mg/67ml      . morphine      . olopatadine  1 drop Both Eyes BID  . pantoprazole (PROTONIX) IV  40 mg Intravenous Q24H  . piperacillin-tazobactam (ZOSYN)  IV  3.375 g Intravenous Q8H  . propofol      . propofol      . rocuronium      . simvastatin  20 mg Oral Daily  . sodium chloride  1,000 mL Intravenous Once  . sodium chloride  500 mL Intravenous Once  . sodium chloride  500 mL Intravenous Once  . sodium chloride  3 mL Intravenous Q12H  . succinylcholine      . timolol  1 drop Both  Eyes BID  . vancomycin  1,250 mg Intravenous Q12H  . vancomycin  1,000 mg Intravenous Once  . vancomycin  1,000 mg Intravenous Once  . DISCONTD: allopurinol  300 mg Oral Daily  . DISCONTD: atenolol  50 mg Oral BID  . DISCONTD: brimonidine-timolol  1 drop Both Eyes Q12H  . DISCONTD: furosemide  40 mg Intravenous Once  . DISCONTD: furosemide  40 mg Intravenous Q12H  . DISCONTD: furosemide  40 mg Oral BID  . DISCONTD: glipiZIDE  5 mg Oral BID AC  . DISCONTD: insulin aspart  0-15 Units Subcutaneous TID WC  . DISCONTD: insulin glargine  5 Units Subcutaneous QHS  . DISCONTD: metolazone  5 mg Oral 2 times weekly  . DISCONTD: metoprolol  5 mg Intravenous Q6H  . DISCONTD: pantoprazole  40 mg Oral Q1200  . DISCONTD: sodium polystyrene  15 g Oral Once  . DISCONTD: temazepam  30 mg Oral QHS  . DISCONTD: timolol  1 drop  Left Eye BID   Continuous Infusions:   . norepinephrine (LEVOPHED) Adult infusion 10 mcg/min (08/29/11 1224)  . propofol 10 mcg/kg/min (08/29/11 0600)   PRN Meds:.sodium chloride, acetaminophen, sodium chloride, DISCONTD: ALPRAZolam, DISCONTD: HYDROcodone-acetaminophen, DISCONTD: nitroGLYCERIN   Ht: 5\' 9"  (175.3 cm)  Wt: 282 lb 13.6 oz (128.3 kg)  Ideal Wt: 86 kg % Ideal Wt: 149%  Usual Wt: unable to assess % Usual Wt:   Body mass index is 41.77 kg/(m^2).  Food/Nutrition Related Hx:   Pt admitted with LE cellulitis, also with Stage 4 sacral ulcer. Developed respiratory failure, intubated. Per NP, pt complaining of abdominal pain consistently for the past 2 weeks.  Wife not a bedside to discuss.    Lipase     Component Value Date/Time   LIPASE 15 08/29/2011 1130   Amylase    Component Value Date/Time   AMYLASE 21 08/29/2011 1130   NGT to LIWS. Pt with OGT. Diprivan @ 7.7 mL/hr providing 203 kcal/day.  If warranted, enteral nutrition to provide 60-70% of estimated calorie needs (22-25 kcals/kg ideal body weight) and 100% of estimated protein needs, based on  ASPEN guidelines for permissive underfeeding in critically ill obese individuals.   Labs:  CMP     Component Value Date/Time   NA 135 08/29/2011 0330   K 5.4* 08/29/2011 0330   CL 101 08/29/2011 0330   CO2 26 08/29/2011 0330   GLUCOSE 68* 08/29/2011 0330   BUN 14 08/29/2011 0330   CREATININE 1.02 08/29/2011 0330   CALCIUM 8.8 08/29/2011 0330   PROT 7.3 08/28/2011 2030   ALBUMIN 3.3* 08/28/2011 2030   AST 13 08/28/2011 2030   ALT 9 08/28/2011 2030   ALKPHOS 97 08/28/2011 2030   BILITOT 0.3 08/28/2011 2030   GFRNONAA 73* 08/29/2011 0330   GFRAA 85* 08/29/2011 0330   Lab Results  Component Value Date   HGBA1C 6.2* 08/28/2011    Intake:   Intake/Output Summary (Last 24 hours) at 08/29/11 1402 Last data filed at 08/29/11 0800  Gross per 24 hour  Intake   63.1 ml  Output    150 ml  Net  -86.9 ml    Output:  No bowel movement since admission.  Last BM 1/23  Diet Order: NPO  Supplements/Tube Feeding: none at this time.  IVF:    norepinephrine (LEVOPHED) Adult infusion Last Rate: 10 mcg/min (08/29/11 1224)  propofol Last Rate: 10 mcg/kg/min (08/29/11 0600)    Estimated Nutritional Needs:   Kcal: 1600-1820 kcal Protein: 145-181g Fluid: >2.1L/day  NUTRITION DIAGNOSIS: -Inadequate oral intake (NI-2.1).  Status: Ongoing  RELATED TO: mechanical ventilation  AS EVIDENCE BY: pt intubated, NPO  MONITORING/EVALUATION(Goals): 1. Enteral nutrition; initiation of TFs if warranted by MD.  Noted pt with abdominal pain PTA.   EDUCATION NEEDS: -Education not appropriate at this time  INTERVENTION: 1.  Enteral nutrition; pt may benefit from advancement of tube post-pyloric given abdominal pain- etiology currently unknown.  Recommend initiation of Promote @ 20 mL/hr continuous.  Advance by 10 mL q 4 hrs to 60 mL/hr goal. 2.  Supplements; Prostat 5 times daily to provide 1800 kcal, 165g protein, 1152 mL free water  Dietitian #: [phone]  DOCUMENTATION CODES Per approved criteria    -Morbid Obesity    Nicholas [name] 08/29/2011, 1:52 PM

## 2011-08-29 NOTE — Progress Notes (Signed)
Name: Nicholas [name] MRN: [id number] DOB: [birth year]    LOS: 1  PCCM progress NOTE  Brief patient profile:  71 yo with multiple comorbidities admitted on 1/23 with left left LE cellulitis.  On 1/24 Developed acute respiratory distress with clinical picture consistent with acute pulmonary edema, intubated, transferred to ICU for farther management.  Patients wife reports patient had been complaining of abdominal pain vomiting on a daily basis 2 weeks prior to admission.   Lines / Drains: 1/24  ETT>>> 1/24  OGT>>> Left IJ CVL 1/24>>>  Cultures: 1/24  Blood>>> 1/24  Urine>>> 1/24 Left foot wound >>>  Antibiotics: 1/24  Zosyn (sepsis/cellulitis)>>> 1/24  Vancomycin (sepsis/cellulitis)>>>  Tests / Events: 1/24  CXR>>>Pulmonary edema, slightly better than prior film.  1/24 ECHO>>> 12/24 LE dopplers>>>  The patient is sedated, intubated and unable to provide history, which was obtained for available medical records.     Vital Signs: Temp:  [99.1 F (37.3 C)-104 F (40 C)] 102.9 F (39.4 C) (01/24 0800) Pulse Rate:  [68-120] 86  (01/24 0800) Resp:  [15-25] 16  (01/24 0800) BP: (86-227)/(41-107) 86/55 mmHg (01/24 0800) SpO2:  [90 %-100 %] 98 % (01/24 0800) FiO2 (%):  [80 %-100 %] 80 % (01/24 0745) Weight:  [128.3 kg (282 lb 13.6 oz)] 128.3 kg (282 lb 13.6 oz) (01/24 0228)   Intake/Output Summary (Last 24 hours) at 08/29/11 0935 Last data filed at 08/29/11 0800  Gross per 24 hour  Intake   63.1 ml  Output    150 ml  Net  -86.9 ml   Physical Examination: General:  Mechanically ventilated, no acute distress Neuro:  Sedated, synchronous HEENT:  PERRL, ETT, OGT Cardiovascular:  RRR, no murmurs Lungs:  Diffuse rhonichi Abdomen:  Obese, bowel sounds present Musculoskeletal:  Anasasrca Skin:  L foot diabetic ulcer, L LE edematous / erythematous  Ventilator settings: Vent Mode:  [-] PRVC FiO2 (%):  [80 %-100 %] 80 % Set Rate:  [15 bmp] 15 bmp Vt Set:  [600 mL] 600  mL PEEP:  [5 cmH20] 5 cmH20 Plateau Pressure:  [13 cmH20] 13 cmH20  Labs and Imaging:    Lab 08/29/11 0330 08/29/11 0050 08/28/11 2030  NA 135 -- 137  K 5.4* -- 5.3*  CL 101 -- 100  CO2 26 -- 29  BUN 14 -- 13  CREATININE 1.02 1.10 1.06  GLUCOSE 68* -- 108*    Lab 08/29/11 0330 08/29/11 0050 08/28/11 2030  HGB 13.7 12.5* 14.7  HCT 42.2 40.1 46.0  WBC 15.8* 14.1* 16.3*  PLT 142* 143* 172   Assessment and Plan: Acute respiratory failure secondary to acute pulmonary edema vs ALI  Lab 08/29/11 0618  PHART 7.276*  PCO2ART 58.0*  PO2ART 154.0*  HCO3 25.2*  TCO2 22.4  O2SAT 98.7  Plan: -Full mechanical support - goal pH > 7.30, goal SpO2 > 92 -  f/u ABG - f/u CXR  Hypotension/shock- possibly due to over diuresis and betablockers vs sepsis/septic shock, meets SIRS criteria Plan: -Place CVL and a-line -monitor CVP to a goal of 8-12 -levophed for MAP >65 -Check cortisol level  Acute pulmonary edema secondary to hypertensive emergency (SBP 220 at the time of initial presentation).  Coronary artery disease / hypertension.  ECG>>>LV strain vs ischemia.  Cards feels not candidate for intervention.  -Hold diuresis for hypotension -Already on ASA / Plavix / Simvastatin -Hold metoprolol for hypotension -Cycle cardiac enzymes   SIRS/ID/Fever : source unclear, Possible cellulitis of LEs.   Lab 08/29/11  0330 08/29/11 0050 08/28/11 2050 08/28/11 2030  PROCALCITON -- -- -- --  WBC 15.8* 14.1* -- 16.3*  LATICACIDVEN -- -- 2.1 --  Plan: -Continue Vanco/Zosyn -Monitor CBC, check procalc, and lactic acid    Abdominal pain with vomiting-  Plan: -Check amylase and lipase -NGT to LIWS  Left lower extremity cellulitis, diabetic ulcer  Lab 08/29/11 0330 08/29/11 0050 08/28/11 2030  WBC 15.8* 14.1* 16.3*  Plan: -Vanco / Zosyn -Venous Doppler -Wound care consult  Diabetes CBG (last 3)   Basename 08/29/11 0745  GLUCAP 107*  Plan: -Hold oral agents /  Lantus -SSI  Hyperkalemia  Lab 08/29/11 0330 08/28/11 2030  K 5.4* 5.3*  plan: -stop metolazone -repeat this afternoon  Seizure disorder, no active seizures -->  Continue Depakote  Best practices / Disposition -->  ICU status under PCCM -->  Full code -->  Heparin for DVT Px -->  Protonix for GI Px -->  Ventilator bundle -->  NPO -->  Family updated at bedside  The patient is critically ill with multiple organ systems failure and requires high complexity decision making for assessment and support, frequent evaluation and titration of therapies, application of advanced monitoring technologies and extensive interpretation of multiple databases. Critical Care Time devoted to patient care services described in this note is 45 minutes.   Sandrea Hughs, MD Pulmonary and Critical Care Medicine [facility] Cell [phone]

## 2011-08-29 NOTE — Progress Notes (Signed)
Left lower extremity venous duplex completed.  Preliminary report is negative for DVT, SVT, or a Baker's cyst in the left leg.  Negative for DVT in the right common femoral vein. 

## 2011-08-29 NOTE — Progress Notes (Signed)
Pt found attempting to get OOB to void. Pt was assisted back into bed and helped to void. However pt was unable to void. And pt is appeared to be having difficulty catching his breath. When O2 sat was checked pt was 84% on 4L and HR was in 120"s. Oxygen was increased and pt was unable to get above 89% on 6L. Pt was still attempting to get OOB to void and calling out wife's name for assistance. Pt was difficult to reorient at this point, and switched to a venti mask. Gwinda Passe NP on call was notified and received orders for breathing treatment. At this time pt was placed on a NRB d/t oxygen sats unable to come up. And RRT called. RRT texted paged Gwinda Passe NP and did not respond. As well as RN Paramedic texted paged Gwinda Passe NP and did not respond. Pt was treated by ICU MD and RRT and transferred to ICU.

## 2011-08-29 NOTE — Progress Notes (Signed)
ANTIBIOTIC CONSULT NOTE - INITIAL  Pharmacy Consult for Vancomycin and Zosyn  Indication: Diabetic foot ulcer  No Known Allergies  Patient Measurements: Height: 5\' 9"  (175.3 cm) Weight: 282 lb 13.6 oz (128.3 kg) IBW/kg (Calculated) : 70.7  Adjusted Body Weight:   Vital Signs: Temp: 99.1 F (37.3 C) (01/23 2350) Temp src: Oral (01/23 2350) BP: 120/63 mmHg (01/24 0120) Pulse Rate: 92  (01/24 0120) Intake/Output from previous day:   Intake/Output from this shift:    Labs:  Basename 08/29/11 0050 08/28/11 2030  WBC 14.1* 16.3*  HGB 12.5* 14.7  PLT 143* 172  LABCREA -- --  CREATININE 1.10 1.06   Estimated Creatinine Clearance: 84 ml/min (by C-G formula based on Cr of 1.1). No results found for this basename: VANCOTROUGH:2,VANCOPEAK:2,VANCORANDOM:2,GENTTROUGH:2,GENTPEAK:2,GENTRANDOM:2,TOBRATROUGH:2,TOBRAPEAK:2,TOBRARND:2,AMIKACINPEAK:2,AMIKACINTROU:2,AMIKACIN:2, in the last 72 hours   Microbiology: No results found for this or any previous visit (from the past 720 hour(s)).  Medical History: Past Medical History  Diagnosis Date  . CHF (congestive heart failure)   . Hypertension   . Diabetes mellitus   . CVA (cerebral infarction)   . Epilepsy   . Hx of CABG   . Peripheral vascular disease   . Ulcer     left foot  . Cellulitis   . Chronic kidney disease   . Dyslipidemia   . Chronic venous insufficiency   . Sleep apnea   . Morbid obesity   . Diverticulosis   . Benign prostatic hypertrophy   . Glaucoma   . Staphylococcus aureus infection   . Gout   . Depression   . Insomnia   . Reflux   . Coronary artery disease     Medications:  Anti-infectives     Start     Dose/Rate Route Frequency Ordered Stop   08/29/11 1200   vancomycin (VANCOCIN) 1,250 mg in sodium chloride 0.9 % 250 mL IVPB        1,250 mg 166.7 mL/hr over 90 Minutes Intravenous Every 12 hours 08/29/11 0258     08/29/11 0045   piperacillin-tazobactam (ZOSYN) IVPB 3.375 g        3.375 g 12.5  mL/hr over 240 Minutes Intravenous 3 times per day 08/29/11 0037     08/29/11 0045   vancomycin (VANCOCIN) IVPB 1000 mg/200 mL premix        1,000 mg 200 mL/hr over 60 Minutes Intravenous  Once 08/29/11 0038 08/29/11 0223   08/28/11 2200   vancomycin (VANCOCIN) IVPB 1000 mg/200 mL premix        1,000 mg 200 mL/hr over 60 Minutes Intravenous  Once 08/28/11 2146 08/28/11 2350         Assessment: Patient with diabetic foot ulcer, s/p 2 weeks of antibiotics.  First dose of antibiotics already given in ED  Goal of Therapy:  Vancomycin trough level 15-20 mcg/ml  Zosyn based on renal function   Plan:  Measure antibiotic drug levels at steady state Follow up culture results Vancomycin 1gm to make 2gm total, then 1250mg  iv q12hr Zosyn 3.375g IV Q8H infused over 4hrs.   Darlina Guys, Jacquenette Shone Crowford 08/29/2011,3:00 AM

## 2011-08-29 NOTE — Procedures (Signed)
Name: Nicholas [name] MRN: [id number] DOB: 1941/07/19   PROCEDURE NOTE  Procedure:  Endotracheal intubation.  Indication:  Acute respiratory failure  Consent:  Consent was implied due to the emergency nature of the procedure.  Anesthesia:  A total of 10 mg of Etomidate was given intravenously.  Procedure summary:  Appropriate equipment was assembled. The patient was identified as [name] and safety timeout was performed. The patient was placed supine, with head in sniffing position. After adequate level of anesthesia was achieved, a GlydeScope #3 blade was inserted into the oropharynx and the vocal cords were visualized. A 7.5 endotracheal tube was inserted with some difficulty due to crowded oropharynx and visualized going through the vocal cords. The stylette was removed and cuff inflated. Colorimetric change was noted on the CO2 meter. Breath sounds were heard over both lung fields equally. Post procedure chest xray was ordered.  Complications:  No immediate complications were noted.  Hemodynamic parameters and oxygenation remained stable throughout the procedure.    Orlean Bradford, M.D. Pulmonary and Critical Care Medicine [facility] Cell: [phone] Pager: [phone]  08/29/2011, 6:50 AM

## 2011-08-30 ENCOUNTER — Inpatient Hospital Stay (HOSPITAL_COMMUNITY): Payer: Medicare Other

## 2011-08-30 ENCOUNTER — Other Ambulatory Visit: Payer: Self-pay

## 2011-08-30 LAB — PROCALCITONIN: Procalcitonin: 30.54 ng/mL

## 2011-08-30 LAB — CBC
Platelets: 141 10*3/uL — ABNORMAL LOW (ref 150–400)
RDW: 15.9 % — ABNORMAL HIGH (ref 11.5–15.5)
WBC: 11.2 10*3/uL — ABNORMAL HIGH (ref 4.0–10.5)

## 2011-08-30 LAB — BLOOD GAS, ARTERIAL
Acid-Base Excess: 1.4 mmol/L (ref 0.0–2.0)
Bicarbonate: 25.2 mEq/L — ABNORMAL HIGH (ref 20.0–24.0)
FIO2: 0.4 %
O2 Saturation: 97.9 %
Patient temperature: 37
TCO2: 22.7 mmol/L (ref 0–100)
pH, Arterial: 7.428 (ref 7.350–7.450)

## 2011-08-30 LAB — BASIC METABOLIC PANEL
BUN: 25 mg/dL — ABNORMAL HIGH (ref 6–23)
CO2: 27 mEq/L (ref 19–32)
Calcium: 8.5 mg/dL (ref 8.4–10.5)
Chloride: 100 mEq/L (ref 96–112)
GFR calc Af Amer: 40 mL/min — ABNORMAL LOW (ref >90)
GFR calc non Af Amer: 33 mL/min — ABNORMAL LOW (ref >90)
Glucose, Bld: 74 mg/dL (ref 70–99)
Glucose, Bld: 84 mg/dL (ref 70–99)
Potassium: 3.5 mEq/L (ref 3.5–5.1)
Potassium: 3.6 mEq/L (ref 3.5–5.1)
Sodium: 138 mEq/L (ref 135–145)

## 2011-08-30 LAB — GLUCOSE, CAPILLARY: Glucose-Capillary: 110 mg/dL — ABNORMAL HIGH (ref 70–99)

## 2011-08-30 LAB — CARDIAC PANEL(CRET KIN+CKTOT+MB+TROPI): Total CK: 160 U/L (ref 7–232)

## 2011-08-30 MED ORDER — SODIUM CHLORIDE 0.9 % IJ SOLN: 10.0000 mL | Freq: Two times a day (BID) | INTRAMUSCULAR | Status: DC

## 2011-08-30 MED ORDER — FUROSEMIDE 80 MG PO TABS
80.0000 mg | ORAL_TABLET | Freq: Two times a day (BID) | ORAL | Status: DC
  Administered 2011-08-30 – 2011-09-03 (×8): 80 mg via ORAL
  Filled 2011-08-30 (×10): qty 1

## 2011-08-30 MED ORDER — VANCOMYCIN HCL 1000 MG IV SOLR
1250.0000 mg | INTRAVENOUS | Status: DC
  Administered 2011-08-31: 1250 mg via INTRAVENOUS
  Filled 2011-08-30 (×2): qty 1250

## 2011-08-30 MED ORDER — HYDRALAZINE HCL 25 MG PO TABS
25.0000 mg | ORAL_TABLET | Freq: Four times a day (QID) | ORAL | Status: DC
  Administered 2011-08-30 – 2011-09-03 (×16): 25 mg via ORAL
  Filled 2011-08-30 (×21): qty 1

## 2011-08-30 MED ORDER — SODIUM CHLORIDE 0.9 % IJ SOLN
10.0000 mL | Freq: Two times a day (BID) | INTRAMUSCULAR | Status: DC
  Administered 2011-08-30 – 2011-09-02 (×4): 10 mL via INTRAVENOUS

## 2011-08-30 NOTE — Progress Notes (Signed)
Subjective: Patient intubated. Recognized me and shook my hand. He lives at last 24 hours reviewed. As per pulmonary critical care medicine and Dr. Donnie Aho. She had preferred that he be a nonintubation patient prior to the events of yesterday.Marland Kitchen Hopefully he can be weaned off the ventilator as soon as possible.  Objective: Weight change: 0.2 kg (7.1 oz)  Intake/Output Summary (Last 24 hours) at 08/30/11 0626 Last data filed at 08/30/11 0500  Gross per 24 hour  Intake 3973.9 ml  Output    725 ml  Net 3248.9 ml   Filed Vitals:   08/30/11 0400 08/30/11 0430 08/30/11 0432 08/30/11 0500  BP:  144/60    Pulse: 66 63  64  Temp: 100.8 F (38.2 C) 100.6 F (38.1 C)  100.4 F (38 C)  TempSrc:      Resp: 18 16  15   Height:      Weight:   128.5 kg (283 lb 4.7 oz)   SpO2: 97% 96%  99%   General: Mechanically ventilated, no acute distress  Neuro: Awake, moving all extremities HEENT: PERRL, ETT, OGT  Neck: Cannot assess for JVD  Cardiovascular: RRR, no murmurs  Lungs: Bilateral diminished air entry, diffused rales  Abdomen: Obese, bowel sounds present  Musculoskeletal: Anasasrca  Skin: L foot diabetic ulcer, L LE edematous / erythematous    Lab Results:  Basename 08/29/11 0330 08/29/11 0050 08/28/11 2030  NA 135 -- 137  K 5.4* -- 5.3*  CL 101 -- 100  CO2 26 -- 29  GLUCOSE 68* -- 108*  BUN 14 -- 13  CREATININE 1.02 1.10 --  CALCIUM 8.8 -- 9.3  MG -- -- --  PHOS -- -- --    Basename 08/28/11 2030  AST 13  ALT 9  ALKPHOS 97  BILITOT 0.3  PROT 7.3  ALBUMIN 3.3*    Basename 08/29/11 1130 08/28/11 2030  LIPASE 15 20  AMYLASE 21 --    Basename 08/29/11 0330 08/29/11 0050 08/28/11 2030  WBC 15.8* 14.1* --  NEUTROABS -- -- 14.5*  HGB 13.7 12.5* --  HCT 42.2 40.1 --  MCV 95.0 94.1 --  PLT 142* 143* --    Basename 08/30/11 0045 08/29/11 1800 08/29/11 1200  CKTOTAL 160 184 148  CKMB 7.3* 10.7* 10.8*  CKMBINDEX -- -- --  TROPONINI 5.32* 6.66* 6.76*   No components  found with this basename: POCBNP:3 No results found for this basename: DDIMER:2 in the last 72 hours  Basename 08/28/11 2030  HGBA1C 6.2*   No results found for this basename: CHOL:2,HDL:2,LDLCALC:2,TRIG:2,CHOLHDL:2,LDLDIRECT:2 in the last 72 hours No results found for this basename: TSH,T4TOTAL,FREET3,T3FREE,THYROIDAB in the last 72 hours No results found for this basename: VITAMINB12:2,FOLATE:2,FERRITIN:2,TIBC:2,IRON:2,RETICCTPCT:2 in the last 72 hours  Studies/Results: Dg Chest 2 View  08/28/2011  *RADIOLOGY REPORT*  Clinical Data: Fever and shortness of breath.  Weakness.  CHEST - 2 VIEW  Comparison: Portable chest 07/29/2011.  Findings: The heart is enlarged.  Mild edema is present. No focal airspace disease is evident.  The lateral view is obscured by motion.  Mild degenerative changes of the thoracic spine are stable.  IMPRESSION:  1.  Cardiomegaly and mild edema, compatible with congestive heart failure.  Original Report Authenticated By: Jamesetta Orleans. MATTERN, M.D.   Dg Chest Port 1 View  08/29/2011  *RADIOLOGY REPORT*  Clinical Data: Respiratory distress; status post intubation.  PORTABLE CHEST - 1 VIEW  Comparison: Chest radiograph performed earlier today at 05:24 a.m.  Findings: The patient's endotracheal tube is  seen ending 2 cm above the carina.  An enteric tube is noted apparently ending at the mid esophagus.  Persistent bilateral airspace opacities raise concern for pulmonary edema.  Small bilateral pleural effusions are again seen.  No pneumothorax is identified.  The cardiomediastinal silhouette is enlarged; the patient is status post median sternotomy, with evidence of prior CABG.  No acute osseous abnormalities are seen.  IMPRESSION:  1.  Endotracheal tube seen ending 2 cm above the carina. 2.  Enteric tube appears to end at the mid esophagus. 3.  Persistent pulmonary edema, slightly less prominent than on the prior study; small bilateral pleural effusions again seen. 4.   Cardiomegaly again noted.  Original Report Authenticated By: Tonia Ghent, M.D.   Dg Chest Port 1 View  08/29/2011  *RADIOLOGY REPORT*  Clinical Data: Respiratory distress.  PORTABLE CHEST - 1 VIEW  Comparison: Chest radiograph performed 08/28/2011  Findings: The lungs are well-aerated.  Significant vascular congestion is noted, with diffuse bilateral airspace opacities, concerning for significant pulmonary edema.  Small bilateral pleural effusions are noted.  No pneumothorax is seen.  The cardiomediastinal silhouette is mildly enlarged; the patient is status post median sternotomy.  No acute osseous abnormalities are seen.  IMPRESSION: Significant vascular congestion and mild cardiomegaly, with diffuse bilateral airspace opacities, concerning for significant pulmonary edema.  Small bilateral pleural effusions seen.  Findings were discussed with Maralyn Sago RN in the [facility] at 06:05 a.m. on 08/29/2011.  Original Report Authenticated By: Tonia Ghent, M.D.   Dg Chest Port 1v Same Day  08/29/2011  *RADIOLOGY REPORT*  Clinical Data: Evaluate central venous catheter placement  PORTABLE CHEST - 1 VIEW SAME DAY  Comparison: 08/29/2011  Findings: The endotracheal tube tip is above the carina.  There is a left IJ catheter with tip in the SVC.  No pneumothorax is identified.  Nasogastric tube tip is in the stomach.  Stable cardiac enlargement and pulmonary edema.  IMPRESSION:  1.  No change in aeration to the lungs compared with previous exam. 2.  Tip of the central venous catheter is in the SVC.  Original Report Authenticated By: Rosealee Albee, M.D.   Dg Foot Complete Left  08/28/2011  *RADIOLOGY REPORT*  Clinical Data: Wound of the plantar surface left foot.  Question osteomyelitis.  LEFT FOOT - COMPLETE 3+ VIEW  Comparison: Plain films left foot 07/28/2010.  Findings: Soft tissues of the foot are massively swollen.  No plain film evidence of osteomyelitis is identified.  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  Plantar calcaneal spur noted.  IMPRESSION: Massive soft tissue swelling.  No plain film evidence of osteomyelitis.  Original Report Authenticated By: Bernadene Bell. Maricela Curet, M.D.   Medications: Scheduled Meds:   . antiseptic oral rinse  1 application Mouth Rinse QID  . aspirin  81 mg Oral Daily  . brimonidine  1 drop Both Eyes BID  . chlorhexidine  15 mL Mouth/Throat BID  . clopidogrel  75 mg Oral Daily  . divalproex  250 mg Oral Daily  . etomidate      . heparin  5,000 Units Subcutaneous Q8H  . insulin aspart  0-15 Units Subcutaneous Q4H  . latanoprost  1 drop Both Eyes QHS  . lidocaine (cardiac) 100 mg/19ml      . olopatadine  1 drop Both Eyes BID  . pantoprazole (PROTONIX) IV  40 mg Intravenous Q24H  . piperacillin-tazobactam (ZOSYN)  IV  3.375 g Intravenous Q8H  . propofol      .  propofol      . rocuronium      . simvastatin  20 mg Oral Daily  . sodium chloride  500 mL Intravenous Once  . sodium chloride  500 mL Intravenous Once  . succinylcholine      . timolol  1 drop Both Eyes BID  . vancomycin  1,250 mg Intravenous Q12H  . DISCONTD: furosemide  40 mg Intravenous Q12H  . DISCONTD: metolazone  5 mg Oral 2 times weekly  . DISCONTD: metoprolol  5 mg Intravenous Q6H  . DISCONTD: sodium chloride  3 mL Intravenous Q12H   Continuous Infusions:   . sodium chloride    . sodium chloride 50 mL/hr at 08/29/11 1500  . sodium chloride    . norepinephrine (LEVOPHED) Adult infusion 5 mcg/min (08/29/11 1400)  . propofol 15 mcg/kg/min (08/30/11 0444)   PRN Meds:.acetaminophen, DISCONTD: sodium chloride, DISCONTD: sodium chloride  Assessment/Plan:  Continue IV antibiotics for cellulitis. As per pulmonary critical care. I will assume care after patient is discharged from the unit. Appreciate consultation per cardiology, Dr. Donnie Aho as well    LOS: 2 days   Paisli Silfies NEVILL 08/30/2011, 6:26 AM

## 2011-08-30 NOTE — Plan of Care (Signed)
Gretchin, RN paged ([facility]) to notify of patient's A-line BP in the 160's systolic. No actions to be taken at this time. This RN was told to notify the MD if the BP continued to increase.

## 2011-08-30 NOTE — Progress Notes (Signed)
Name: Nicholas [name] MRN: [id number] DOB: 05-21-1941    LOS: 2  PCCM progress NOTE  Brief patient profile:  71 yo with multiple comorbidities admitted on 1/23 with left left LE cellulitis.  On 1/24 Developed acute respiratory distress with clinical picture consistent with acute pulmonary edema, intubated, transferred to ICU for farther management.  Patients wife reports patient had been complaining of abdominal pain vomiting on a daily basis 2 weeks prior to admission.   Lines / Drains: 1/24  ETT>>>1/25 1/24  OGT>>>1/25  1/24 Left IJ CVL>>>  Cultures: 1/24  Blood>>> 1/24  Urine>>> 1/24 Left foot wound >>>  Antibiotics: 1/24  Zosyn (sepsis/cellulitis)>>> 1/24  Vancomycin (sepsis/cellulitis)>>>  Tests / Events: 1/24  CXR>>>Pulmonary edema, slightly better than prior film.  1/24 ECHO>>> EF 30%; grade 1 diastolic dysfunction 12/24 LE dopplers>>>    Vital Signs: Temp:  [100 F (37.8 C)-102.9 F (39.4 C)] 100.4 F (38 C) (01/25 0830) Pulse Rate:  [60-79] 61  (01/25 0830) Resp:  [1-24] 17  (01/25 0830) BP: (69-144)/(35-60) 136/51 mmHg (01/25 0815) SpO2:  [92 %-99 %] 98 % (01/25 0830) Arterial Line BP: (88-178)/(37-71) 157/51 mmHg (01/25 0830) FiO2 (%):  [40 %-80 %] 40 % (01/25 0830) Weight:  [128.5 kg (283 lb 4.7 oz)] 128.5 kg (283 lb 4.7 oz) (01/25 0432)   Intake/Output Summary (Last 24 hours) at 08/30/11 0847 Last data filed at 08/30/11 0800  Gross per 24 hour  Intake   3113 ml  Output   2715 ml  Net    398 ml   Physical Examination: General:  Mechanically ventilated, no acute distress Neuro:  Awake, following commands HEENT:  PERRL, ETT, OGT Cardiovascular:  RRR, no murmurs Lungs:  Clear Abdomen:  Obese, bowel sounds present Musculoskeletal:  Anasasrca Skin:  L foot diabetic ulcer, L LE edematous / erythematous  Ventilator settings: Vent Mode:  [-] PRVC FiO2 (%):  [40 %-80 %] 40 % Set Rate:  [15 bmp] 15 bmp Vt Set:  [600 mL] 600 mL PEEP:  [5 cmH20] 5  cmH20 Plateau Pressure:  [19 cmH20-23 cmH20] 19 cmH20  Labs and Imaging:    Lab 08/30/11 0540 08/29/11 0330 08/29/11 0050 08/28/11 2030  NA 141 135 -- 137  K 3.5 5.4* -- 5.3*  CL 103 101 -- 100  CO2 27 26 -- 29  BUN 25* 14 -- 13  CREATININE 1.99* 1.02 1.10 --  GLUCOSE 74 68* -- 108*    Lab 08/30/11 0540 08/29/11 0330 08/29/11 0050  HGB 11.7* 13.7 12.5*  HCT 36.5* 42.2 40.1  WBC 11.2* 15.8* 14.1*  PLT 141* 142* 143*   CXR: 1/25 Bilateral airspace disease in lower lobes.   Assessment and Plan: Acute respiratory failure secondary to acute pulmonary edema vs ALI  Lab 08/30/11 0445 08/29/11 0618  PHART 7.428 7.276*  PCO2ART 38.8 58.0*  PO2ART 90.8 154.0*  HCO3 25.2* 25.2*  TCO2 22.7 22.4  O2SAT 97.9 98.7  Plan: -Extubate -f/u CXR   Hypotension/shock- possibly due to over diuresis and betablockers vs sepsis/septic shock, meets SIRS criteria (Resolving)- off pressors since yesterday afternoon  Lab 08/30/11 0549 08/30/11 0540 08/29/11 1130 08/29/11 0330 08/29/11 0050 08/28/11 2050 08/28/11 2030  PROCALCITON -- 30.54 31.09 -- -- -- --  WBC -- 11.2* -- 15.8* 14.1* -- 16.3*  LATICACIDVEN 1.1 -- 1.2 -- -- 2.1 --   Cortisol : 15.8 Plan: -Hold starting steroids due to clinically improving  -Re start home lasix  Acute pulmonary edema secondary to hypertensive emergency (SBP  220 at the time of initial presentation).  Coronary artery disease / hypertension.  ECG>>>LV strain vs ischemia.  Cards feels not candidate for intervention.  -re start home dose Lasix -Already on ASA / Plavix / Simvastatin -Hydralazine PO  Acute Renal Failure-probably due to hypotension from yesterday. Good UOP last  Lab Results  Component Value Date   CREATININE 1.90* 08/30/2011   CREATININE 1.99* 08/30/2011   CREATININE 1.02 08/29/2011    -recheck this afternoon, if continues to rise will get FeNa -monitor UOP  Abdominal pain with vomiting- now that patient is awake, denies any  symptom Plan: -monitor  Left lower extremity cellulitis, diabetic ulcer  Lab 08/30/11 0540 08/29/11 0330 08/29/11 0050 08/28/11 2030  WBC 11.2* 15.8* 14.1* 16.3*  Plan: -Vanco / Zosyn -Venous Doppler -Follow wound care recs  Diabetes CBG (last 3)   Basename 08/30/11 0413 08/29/11 2350 08/29/11 2130  GLUCAP 78 85 83  Plan: -Hold oral agents / Lantus -SSI  Hyperkalemia  Lab 08/30/11 0540 08/29/11 0330 08/28/11 2030  K 3.5 5.4* 5.3*  plan:   Seizure disorder, no active seizures -Continue Depakote  Best practices / Disposition -->  ICU status under PCCM -->  Full code -->  Heparin for DVT Px -->  Protonix for GI Px -->  Advance Diet -->  Family updated at bedside     Sandrea Hughs, MD Pulmonary and Critical Care Medicine [facility] [phone]

## 2011-08-30 NOTE — Progress Notes (Signed)
Patient's ETT sounds as thought is has an air leak but O2 sats remain stable and vent is not alarming. Pt has been repositioned numerous time but sound remains. RT has been paged throughout the night to trouble shoot. RT has deflated cuff, repositioned tube, over inflated cuff but cannot get sound to go away. He concluded that tube would likely be removed in AM and cuff could stay slightly over inflated until a plan of care was determined. Cuff pressure is increased greater than normal value at this time. RT stated that he would pass this along in his report to day shift RT. This RN has passed the message along to day shift RN- Redgie Grayer.

## 2011-08-30 NOTE — Progress Notes (Signed)
Pharmacy Brief Note  SCr rising (1.02-->1.99) on Vancomycin 1250mg  IV q12h, next dose due 12noon.  Will check Vancomycin level today at 11am and hold further Vancomycin doses until level has been reviewed by pharmacy.  Elie Goody, Pharm.D.  [phone] 08/30/2011 7:47 AM

## 2011-08-30 NOTE — Progress Notes (Signed)
ANTIBIOTIC CONSULT NOTE - FOLLOW UP  Pharmacy Consult for Vancomycin, Zosyn Indication: Sepsis, Cellulitis  No Known Allergies  Patient Measurements: Height: 5\' 9"  (175.3 cm) Weight: 283 lb 4.7 oz (128.5 kg) IBW/kg (Calculated) : 70.7    Vital Signs: Temp: 100.2 F (37.9 C) (01/25 1200) Temp src: Axillary (01/25 0800) BP: 158/62 mmHg (01/25 1200) Pulse Rate: 73  (01/25 1200) Tm 101.5  Intake/Output from previous day: 01/24 0701 - 01/25 0700 In: 3119.2 [I.V.:2502.7; IV Piggyback:616.5] Out: 2575 [Urine:1575; Emesis/NG output:1000] Intake/Output from this shift: Total I/O In: 351.5 [I.V.:341.5; IV Piggyback:10] Out: 290 [Urine:290]  Labs:  [facility] 08/30/11 1130 08/30/11 0540 08/29/11 0330 08/29/11 0050  WBC -- 11.2* 15.8* 14.1*  HGB -- 11.7* 13.7 12.5*  PLT -- 141* 142* 143*  LABCREA -- -- -- --  CREATININE 1.90* 1.99* 1.02 --   Estimated Creatinine Clearance: 48.7 ml/min (by C-G formula based on Cr of 1.9).    Vancomycin trough 26.4 today at 1130hr  D#3 Vancomycin 1250mg  IV q12h, D#2 Zosyn 3.375 grams IV q8h (extended-infusion).   Assessment:  Serum creatinine rising, Vancomycin trough supratherapeutic.  Current Zosyn dosage appropriate as long as CrCl > 76mL/min  Goal Vancomycin Trough:  15-20  Plan:   Hold Vancomycin today, resume tomorrow at reduced dosage (1250mg  IV q24h).  Continue present Zosyn dosage.  Follow serum creatinine, recheck Vancomycin trough as appropriate.  Await results from blood and wound cultures.  Elie Goody, Pharm.D.  [phone] 08/30/2011 12:56 PM

## 2011-08-30 NOTE — Progress Notes (Signed)
Subjective:  Patient more alert today and able to shake hands and knows me. Not SOB, no overt pain.  Objective:  Vital Signs in the last 24 hours: BP 144/60  Pulse 61  Temp(Src) 100 F (37.8 C) (Axillary)  Resp 15  Ht 5\' 9"  (1.753 m)  Wt 128.5 kg (283 lb 4.7 oz)  BMI 41.83 kg/m2  SpO2 98%  Physical Exam: Severely obese [place] male in NAD Lungs: reduced breath sounds Cardiac:  Regular rhythm, normal S1 and S2, no S3 Extremities:  4+ edema present left leg with ulcer on foot and significant celluitis  Intake/Output from previous day: 01/24 0701 - 01/25 0700 In: 3119.2 [I.V.:2502.7; IV Piggyback:616.5] Out: 2575 [Urine:1575; Emesis/NG output:1000] Weight change: 0.2 kg (7.1 oz)  Lab Results: Basic Metabolic Panel:  Basename 08/30/11 0540 08/29/11 0330  NA 141 135  K 3.5 5.4*  CL 103 101  CO2 27 26  GLUCOSE 74 68*  BUN 25* 14  CREATININE 1.99* 1.02   CBC:  Basename 08/30/11 0540 08/29/11 0330 08/28/11 2030  WBC 11.2* 15.8* --  NEUTROABS -- -- 14.5*  HGB 11.7* 13.7 --  HCT 36.5* 42.2 --  MCV 93.8 95.0 --  PLT 141* 142* --   Cardiac Enzymes:  Basename 08/30/11 0045 08/29/11 1800 08/29/11 1200  CKTOTAL 160 184 148  CKMB 7.3* 10.7* 10.8*  CKMBINDEX -- -- --  TROPONINI 5.32* 6.66* 6.76*    Protime: . Lab Results  Component Value Date   INR 1.13 07/29/2011   INR 1.00 09/25/2010   INR 1.09 07/28/2010    Telemetry: Reviewed : sinus rhythm  Echo: Tech difficult.  EF looks down, but very poor quality. Prob around 30% Could be due to sepsis syndrome.  Assessment/Plan:  1. Pulmonary edema multifactorial due to hypertensive urgency and demand ischemia May be pneumonic also 2. Sepsis syndrome  3. Cellultis  4. Coronary artery disease With previous redo bypass grafting not a candidate for additional surgery  5. Abnormal troponin and CPK-MB likely due to demand ischemia not primary event 6. Acute renal failure prob secondary to hypotension  Rec:  Appears  to be predominantly sepsis mediated.  Continue supportive care and hopefully extubate soon.  The leg looks really bad.   Darden Palmer.  MD [facility] 08/30/2011, 8:14 AM

## 2011-08-30 NOTE — Progress Notes (Signed)
Offered support to family. Many were gathered in ICU waiting area. Presented them with prayer shawl. Prayed. Presence; listening.   08/30/11 1400  Clinical Encounter Type  Visited With Family  Visit Type Spiritual support;Social support  Recommendations Follow up  Spiritual Encounters  Spiritual Needs Prayer;Emotional (Prayer shawl)

## 2011-08-30 NOTE — Procedures (Addendum)
Extubation Procedure Note  Patient Details:   Name: Nicholas [name] DOB: [birth year] MRN: [id number]   Airway Documentation:  Airway (Active)  Secured at (cm) 23 cm 08/30/2011  8:39 AM  Measured From Lips 08/30/2011  8:39 AM  Secured Location Center 08/30/2011  8:39 AM  Secured By [employer] 08/30/2011  8:39 AM  Tube Holder Repositioned Yes 08/30/2011  3:20 AM  Cuff Pressure (cm H2O) 24 cm H2O 08/29/2011  9:08 PM  Site Condition Dry 08/30/2011  3:20 AM  Pt placed in sitting position, cuff leak present, sux orally and ETT. Extubation tolerated well, all vitals stable throughout procedure.  Pt placed on 4L nasal cannula.  Evaluation  O2 sats: O2 sats:stable throughout Complications: none Patient did tolerate procedure well. Bilateral Breath Sounds: Diminished Suctioning: Airway and ETT Able to speak after extubation  Revonda Humphrey 08/30/2011, 10:37 AM

## 2011-08-31 ENCOUNTER — Inpatient Hospital Stay (HOSPITAL_COMMUNITY): Payer: Medicare Other

## 2011-08-31 DIAGNOSIS — I2510 Atherosclerotic heart disease of native coronary artery without angina pectoris: Secondary | ICD-10-CM

## 2011-08-31 DIAGNOSIS — J9600 Acute respiratory failure, unspecified whether with hypoxia or hypercapnia: Secondary | ICD-10-CM | POA: Diagnosis present

## 2011-08-31 LAB — CBC
Hemoglobin: 12 g/dL — ABNORMAL LOW (ref 13.0–17.0)
MCHC: 32 g/dL (ref 30.0–36.0)
Platelets: 145 10*3/uL — ABNORMAL LOW (ref 150–400)
RBC: 4.03 MIL/uL — ABNORMAL LOW (ref 4.22–5.81)

## 2011-08-31 LAB — GLUCOSE, CAPILLARY
Glucose-Capillary: 113 mg/dL — ABNORMAL HIGH (ref 70–99)
Glucose-Capillary: 152 mg/dL — ABNORMAL HIGH (ref 70–99)

## 2011-08-31 LAB — BASIC METABOLIC PANEL
GFR calc non Af Amer: 35 mL/min — ABNORMAL LOW (ref >90)
Glucose, Bld: 159 mg/dL — ABNORMAL HIGH (ref 70–99)
Potassium: 2.9 mEq/L — ABNORMAL LOW (ref 3.5–5.1)
Sodium: 141 mEq/L (ref 135–145)

## 2011-08-31 LAB — TRIGLYCERIDES: Triglycerides: 202 mg/dL — ABNORMAL HIGH (ref <150)

## 2011-08-31 LAB — PROCALCITONIN: Procalcitonin: 19.16 ng/mL

## 2011-08-31 MED ORDER — HYDROCODONE-ACETAMINOPHEN 5-325 MG PO TABS
1.0000 | ORAL_TABLET | ORAL | Status: DC | PRN
  Administered 2011-08-31: 1 via ORAL
  Filled 2011-08-31: qty 1

## 2011-08-31 MED ORDER — ALPRAZOLAM ER 1 MG PO TB24
1.0000 mg | ORAL_TABLET | Freq: Every evening | ORAL | Status: DC | PRN
  Administered 2011-08-31 – 2011-09-02 (×4): 1 mg via ORAL
  Filled 2011-08-31 (×4): qty 1

## 2011-08-31 MED ORDER — TEMAZEPAM 15 MG PO CAPS
30.0000 mg | ORAL_CAPSULE | Freq: Every evening | ORAL | Status: DC | PRN
  Administered 2011-08-31 – 2011-09-02 (×4): 30 mg via ORAL
  Filled 2011-08-31 (×4): qty 2

## 2011-08-31 MED ORDER — SODIUM CHLORIDE 0.9 % IJ SOLN
10.0000 mL | INTRAMUSCULAR | Status: DC | PRN
  Administered 2011-09-01 – 2011-09-03 (×10): 10 mL

## 2011-08-31 MED ORDER — POTASSIUM CHLORIDE CRYS ER 20 MEQ PO TBCR
40.0000 meq | EXTENDED_RELEASE_TABLET | Freq: Two times a day (BID) | ORAL | Status: AC
  Administered 2011-08-31 (×2): 40 meq via ORAL
  Filled 2011-08-31 (×2): qty 2

## 2011-08-31 NOTE — Progress Notes (Signed)
Patient ID: Nicholas [name], male   DOB: Feb 21, 1941, 71 y.o.   MRN: [id number] Subjective:  No complaints  Still tough to breath and legs "bigger" than usual  Objective:  Vital Signs in the last 24 hours: BP 153/69  Pulse 63  Temp(Src) 98.1 F (36.7 C) (Oral)  Resp 16  Ht 5\' 9"  (1.753 m)  Wt 124.3 kg (274 lb 0.5 oz)  BMI 40.47 kg/m2  SpO2 97%  Physical Exam: Severely obese [place] male in NAD Lungs: reduced breath sounds Cardiac:  Regular rhythm, normal S1 and S2, no S3 Extremities:  4+ edema present left leg with ulcer on foot and significant celluitis LLE  Intake/Output from previous day: 01/25 0701 - 01/26 0700 In: 954 [P.O.:240; I.V.:591.5; IV Piggyback:122.5] Out: 3540 [Urine:3540] Weight change: -4.2 kg (-9 lb 4.2 oz)  Lab Results: Basic Metabolic Panel:  Basename 08/31/11 0405 08/30/11 1130  NA 141 138  K 2.9* 3.6  CL 101 100  CO2 30 27  GLUCOSE 159* 84  BUN 26* 25*  CREATININE 1.88* 1.90*   CBC:  Basename 08/31/11 0405 08/30/11 0540 08/28/11 2030  WBC 9.4 11.2* --  NEUTROABS -- -- 14.5*  HGB 12.0* 11.7* --  HCT 37.5* 36.5* --  MCV 93.1 93.8 --  PLT 145* 141* --   Cardiac Enzymes:  Basename 08/30/11 0045 08/29/11 1800 08/29/11 1200  CKTOTAL 160 184 148  CKMB 7.3* 10.7* 10.8*  CKMBINDEX -- -- --  TROPONINI 5.32* 6.66* 6.76*    Protime: . Lab Results  Component Value Date   INR 1.13 07/29/2011   INR 1.00 09/25/2010   INR 1.09 07/28/2010    Telemetry: Reviewed : sinus rhythm  Echo: Tech difficult.  EF looks down, but very poor quality. Prob around 30% Could be due to sepsis syndrome.  Assessment/Plan:  1. Pulmonary edema multifactorial due to hypertensive urgency and demand ischemia May be pneumonic also 2. Sepsis syndrome  3. Cellultis  4. Coronary artery disease With previous redo bypass grafting not a candidate for additional surgery  5. Abnormal troponin and CPK-MB likely due to demand ischemia not primary event 6. Acute renal  failure prob secondary to hypotension  Rec:  Supplement K, continue to run dry.  Continue antibiotics. Cardiac status table despite SEMI and previous CABG x2  Charlton Haws  08/31/2011, 8:22 AM

## 2011-08-31 NOTE — Progress Notes (Signed)
Called K+ level of 2.9 to Nicole,RN at [facility], she advised would tell Dr. Tiburcio Pea.

## 2011-08-31 NOTE — Progress Notes (Signed)
Name: Nicholas [name] MRN: [id number] DOB: 05/08/1941    LOS: 3  PCCM progress NOTE  Brief patient profile:  71 yo with multiple comorbidities admitted on 1/23 with left left LE cellulitis.  On 1/24 Developed acute respiratory distress with clinical picture consistent with acute pulmonary edema, intubated, transferred to ICU for farther management.  Patients wife reports patient had been complaining of abdominal pain vomiting on a daily basis 2 weeks prior to admission.   Lines / Drains: 1/24  ETT>>>1/25 1/24  OGT>>>1/25 1/24 Left IJ CVL>>>  Cultures: 1/24  Blood>>> 1/24  Urine>>> 1/24 Left foot wound >>> GPR >>   Antibiotics: 1/24  Zosyn (sepsis/cellulitis)>>> 1/24  Vancomycin (sepsis/cellulitis)>>>  Tests / Events: 1/24  CXR>>>Pulmonary edema, slightly better than prior film.  1/24 ECHO>>> EF 30%; grade 1 diastolic dysfunction 12/24 LE dopplers>>> no DVT  Subjective: Tolerated extubation States that still has increased LE edema   Vital Signs: Temp:  [97.7 F (36.5 C)-100.2 F (37.9 C)] 99.1 F (37.3 C) (01/26 1000) Pulse Rate:  [57-73] 67  (01/26 1000) Resp:  [15-20] 18  (01/26 1000) BP: (140-175)/(38-83) 175/64 mmHg (01/26 1000) SpO2:  [93 %-100 %] 100 % (01/26 1000) Arterial Line BP: (178-193)/(61-71) 179/71 mmHg (01/25 1800) Weight:  [124.3 kg (274 lb 0.5 oz)] 124.3 kg (274 lb 0.5 oz) (01/26 0406)   Intake/Output Summary (Last 24 hours) at 08/31/11 1112 Last data filed at 08/31/11 1000  Gross per 24 hour  Intake   1010 ml  Output   4275 ml  Net  -3265 ml   Physical Examination: General:  no acute distress Neuro:  Awake, moves all ext HEENT:  PERRL, ETT, OGT Cardiovascular:  RRR, no murmurs Lungs:  Clear Abdomen:  Obese, bowel sounds present Musculoskeletal:  Anasasrca Skin:  L foot diabetic ulcer, L LE edematous / erythematous   Labs and Imaging:    Lab 08/31/11 0405 08/30/11 1130 08/30/11 0540  NA 141 138 141  K 2.9* 3.6 3.5  CL 101 100 103   CO2 30 27 27   BUN 26* 25* 25*  CREATININE 1.88* 1.90* 1.99*  GLUCOSE 159* 84 74    Lab 08/31/11 0405 08/30/11 0540 08/29/11 0330  HGB 12.0* 11.7* 13.7  HCT 37.5* 36.5* 42.2  WBC 9.4 11.2* 15.8*  PLT 145* 141* 142*   Dg Chest Port 1 View  08/31/2011  *RADIOLOGY REPORT*  Clinical Data: Evaluate endotracheal tube  PORTABLE CHEST - 1 VIEW  Comparison: 08/30/2011; 08/29/2011; 08/28/2011  Findings:  Grossly unchanged enlarged cardiac silhouette and mediastinal contours post median sternotomy and CABG. Interval extubation and removal of enteric tube.  Stable positioning of left jugular approach central venous catheter with the tip either within the SVC, no azygos cannulation is not excluded.  Pulmonary vasculature remains indistinct.  The improved aeration of the bilateral lung bases with persistent perihilar and bibasilar opacities.  No definite pleural effusion or pneumothorax.  Grossly unchanged bones.  IMPRESSION: 1.  Interval extubation and removal of the enteric tube.  No pneumothorax. 2.  Left jugular approach central venous catheter tip is either within the mid SVC, though azygos cannulation is not entirely excluded.  Continued attention on follow-up is recommended. 3.  Persistent findings of pulmonary edema. 4.  Improved aeration of the bilateral lung bases with persistent perihilar and basilar opacities, atelectasis or infiltrate.  Original Report Authenticated By: Waynard Reeds, M.D.   Dg Chest Port 1 View  08/30/2011  *RADIOLOGY REPORT*  Clinical Data: Ventilator patient.  CHF.  PORTABLE CHEST - 1 VIEW  Comparison: 08/29/2011.  Findings: Limited portable exam with chin overlying the lung apices and inferior aspect of the lungs not entirely included.  Endotracheal tube tip 7.8 cm above the carina.  Left central line tip proximal superior vena cava level directed laterally.  No gross pneumothorax.  Post CABG.  Cardiomegaly.  Tortuous aorta.  Asymmetric air space disease most notable lung  bases.  This appears to have progressed slightly since prior examination which may be partially explained by projection.  This may represent pulmonary edema although infectious infiltrate not entirely excluded.  IMPRESSION: Mild progression of asymmetric air space disease as noted above.  Original Report Authenticated By: Fuller Canada, M.D.    Assessment and Plan: Acute respiratory failure secondary to acute pulmonary edema  Lab 08/30/11 0445 08/29/11 0618  PHART 7.428 7.276*  PCO2ART 38.8 58.0*  PO2ART 90.8 154.0*  HCO3 25.2* 25.2*  TCO2 22.7 22.4  O2SAT 97.9 98.7  Plan: - ? Whether he needs CPAP or BiPAP qhs, did OK on [place] O2 overnight - may need sleep study  Hypotension/shock- possibly due to over diuresis and betablockers vs sepsis/septic shock, meets SIRS criteria (Resolving)- off pressors since yesterday afternoon  Lab 08/31/11 0405 08/30/11 0549 08/30/11 0540 08/29/11 1130 08/29/11 0330 08/29/11 0050 08/28/11 2050  PROCALCITON 19.16 -- 30.54 31.09 -- -- --  WBC 9.4 -- 11.2* -- 15.8* 14.1* --  LATICACIDVEN -- 1.1 -- 1.2 -- -- 2.1   Cortisol : 15.8 Plan: -home lasix dose 80mg  bid (he states he wasn't taking at home)  Acute pulmonary edema secondary to hypertensive emergency (SBP 220 at the time of initial presentation).  Coronary artery disease / hypertension.  ECG>>>LV strain vs ischemia.  Cards feels not candidate for intervention.  -home dose Lasix -Already on ASA / Plavix / Simvastatin -Hydralazine PO  Acute Renal Failure- Lab Results  Component Value Date   CREATININE 1.88* 08/31/2011   CREATININE 1.90* 08/30/2011   CREATININE 1.99* 08/30/2011    -monitor UOP adn S Cr  Abdominal pain with vomiting- now that patient is awake, denies any symptom Plan: -monitor  Left lower extremity cellulitis, diabetic ulcer  Lab 08/31/11 0405 08/30/11 0540 08/29/11 0330 08/29/11 0050 08/28/11 2030  WBC 9.4 11.2* 15.8* 14.1* 16.3*  Plan: -Vanco / Zosyn -Venous Doppler =  negative -Follow wound care recs  Diabetes CBG (last 3)   Basename 08/31/11 0742 08/31/11 0342 08/31/11 0012  GLUCAP 113* 159* 100*  Plan: -Hold oral agents / Lantus -SSI  Hyperkalemia  Lab 08/31/11 0405 08/30/11 1130 08/30/11 0540  K 2.9* 3.6 3.5  plan:  Seizure disorder, no active seizures -Continue Depakote  Best practices / Disposition --> Transfer to tele -->  Full code -->  Heparin for DVT Px -->  Protonix for GI Px -->  Advance Diet  Levy Pupa, MD, PhD 08/31/2011, 11:21 AM [facility] [phone] or if no answer [phone]

## 2011-08-31 NOTE — Progress Notes (Signed)
08/31/11 2020 Pt has 7 pain. Called on call physician to get order for Vicodin as there is only Tylenol ordered for prn pain. Pt stated that he gets sick with Tylenol, and always takes vicodin for chronic pain. Order given by Daphane Shepherd

## 2011-09-01 DIAGNOSIS — E876 Hypokalemia: Secondary | ICD-10-CM | POA: Diagnosis not present

## 2011-09-01 DIAGNOSIS — Z66 Do not resuscitate: Secondary | ICD-10-CM | POA: Diagnosis not present

## 2011-09-01 LAB — BASIC METABOLIC PANEL
Calcium: 9.1 mg/dL (ref 8.4–10.5)
Chloride: 97 mEq/L (ref 96–112)
Creatinine, Ser: 1.8 mg/dL — ABNORMAL HIGH (ref 0.50–1.35)
GFR calc Af Amer: 43 mL/min — ABNORMAL LOW (ref >90)

## 2011-09-01 LAB — WOUND CULTURE: Special Requests: NORMAL

## 2011-09-01 LAB — GLUCOSE, CAPILLARY: Glucose-Capillary: 139 mg/dL — ABNORMAL HIGH (ref 70–99)

## 2011-09-01 MED ORDER — AMOXICILLIN-POT CLAVULANATE 875-125 MG PO TABS
1.0000 | ORAL_TABLET | Freq: Two times a day (BID) | ORAL | Status: DC
  Administered 2011-09-01 – 2011-09-03 (×5): 1 via ORAL
  Filled 2011-09-01 (×7): qty 1

## 2011-09-01 MED ORDER — PANTOPRAZOLE SODIUM 40 MG PO TBEC
40.0000 mg | DELAYED_RELEASE_TABLET | Freq: Every day | ORAL | Status: DC
  Administered 2011-09-02: 40 mg via ORAL
  Filled 2011-09-01 (×2): qty 1

## 2011-09-01 NOTE — Progress Notes (Signed)
Patient ID: Nicholas [name], male   DOB: [birth year], 71 y.o.   MRN: [id number] Subjective:  Good day moved to step down.    Objective:  Vital Signs in the last 24 hours: BP 167/76  Pulse 61  Temp(Src) 97.6 F (36.4 C) (Oral)  Resp 20  Ht 5\' 9"  (1.753 m)  Wt 122.1 kg (269 lb 2.9 oz)  BMI 39.75 kg/m2  SpO2 98%  Physical Exam: Severely obese [place] male in NAD Lungs: reduced breath sounds Cardiac:  Regular rhythm, normal S1 and S2, no S3 Extremities:  4+ edema present left leg with ulcer on foot and significant celluitis LLE  Intake/Output from previous day: 01/26 0701 - 01/27 0700 In: 1335 [P.O.:620; I.V.:280; IV Piggyback:435] Out: 3650 [Urine:3650] Weight change: -2.2 kg (-4 lb 13.6 oz)  Lab Results: Basic Metabolic Panel:  Basename 09/01/11 0552 08/31/11 0405  NA 142 141  K 3.3* 2.9*  CL 97 101  CO2 35* 30  GLUCOSE 108* 159*  BUN 24* 26*  CREATININE 1.80* 1.88*   CBC:  Basename 08/31/11 0405 08/30/11 0540  WBC 9.4 11.2*  NEUTROABS -- --  HGB 12.0* 11.7*  HCT 37.5* 36.5*  MCV 93.1 93.8  PLT 145* 141*   Cardiac Enzymes:  Basename 08/30/11 0045 08/29/11 1800 08/29/11 1200  CKTOTAL 160 184 148  CKMB 7.3* 10.7* 10.8*  CKMBINDEX -- -- --  TROPONINI 5.32* 6.66* 6.76*    Protime: . Lab Results  Component Value Date   INR 1.13 07/29/2011   INR 1.00 09/25/2010   INR 1.09 07/28/2010    Telemetry: Reviewed : sinus rhythm  Echo: Tech difficult.  EF looks down, but very poor quality. Prob around 30% Could be due to sepsis syndrome.  Assessment/Plan:  1. Pulmonary edema multifactorial due to hypertensive urgency and demand ischemia May be pneumonic also 2. Sepsis syndrome  3. Cellultis: improving with less erythema 4. Coronary artery disease With previous redo bypass grafting not a candidate for additional surgery  5. Abnormal troponin and CPK-MB likely due to demand ischemia not primary event 6. Acute renal failure prob secondary to  hypotension  Rec:  Supplement K, continue to run dry.  Continue antibiotics. Cardiac status stable despite SEMI and previous CABG x2  Charlton Haws  09/01/2011, 7:53 AM

## 2011-09-01 NOTE — Progress Notes (Signed)
Subjective: Mr. Elsayed continues to feel better. Not short of breath. He had stopped his Lasix at home thinking that he would not have a problem as long as he did not drink much water et Karie Soda. Congestive heart failure his physiology was discussed in layman's terms and he understands that he needs to continue his Lasix daily. In the future, patient does not want to be reintubated. He is a no CODE BLUE and this was made clear by he and his wife today  Objective: Weight change: -2.2 kg (-4 lb 13.6 oz)  Intake/Output Summary (Last 24 hours) at 09/01/11 1047 Last data filed at 09/01/11 0900  Gross per 24 hour  Intake   1120 ml  Output   2750 ml  Net  -1630 ml   Filed Vitals:   08/31/11 2154 09/01/11 0200 09/01/11 0539 09/01/11 1025  BP: 172/73 158/76 167/76 98/60  Pulse: 70 60 61 62  Temp: 97.6 F (36.4 C) 97.8 F (36.6 C) 97.6 F (36.4 C) 98.3 F (36.8 C)  TempSrc: Oral Oral Oral Oral  Resp: 20 20 20 19   Height:      Weight:   122.1 kg (269 lb 2.9 oz)   SpO2: 96% 96% 98% 97%   General: no acute distress  Neuro: Awake, moves all ext, conversive and oriented  HEENT: PERRL, oropharynx clear  Cardiovascular: RRR, no murmurs  Lungs: Clear  Abdomen: Obese, bowel sounds present  Musculoskeletal: Anasasrca  Skin: L foot diabetic ulcer, clean base. L LE edematous / erythematous, but much improved since admission. Skin of lower extremities is wrinkled secondary to improved edema    Lab Results:  [facility] 09/01/11 0552 08/31/11 0405  NA 142 141  K 3.3* 2.9*  CL 97 101  CO2 35* 30  GLUCOSE 108* 159*  BUN 24* 26*  CREATININE 1.80* 1.88*  CALCIUM 9.1 8.3*  MG -- --  PHOS -- --   No results found for this basename: AST:2,ALT:2,ALKPHOS:2,BILITOT:2,PROT:2,ALBUMIN:2 in the last 72 hours  Basename 08/29/11 1130  LIPASE 15  AMYLASE 21    Basename 08/31/11 0405 08/30/11 0540  WBC 9.4 11.2*  NEUTROABS -- --  HGB 12.0* 11.7*  HCT 37.5* 36.5*  MCV 93.1 93.8  PLT 145* 141*     Basename 08/30/11 0045 08/29/11 1800 08/29/11 1200  CKTOTAL 160 184 148  CKMB 7.3* 10.7* 10.8*  CKMBINDEX -- -- --  TROPONINI 5.32* 6.66* 6.76*   No components found with this basename: POCBNP:3 No results found for this basename: DDIMER:2 in the last 72 hours No results found for this basename: HGBA1C:2 in the last 72 hours  Basename 08/31/11 0405  CHOL --  HDL --  LDLCALC --  TRIG 202*  CHOLHDL --  LDLDIRECT --   No results found for this basename: TSH,T4TOTAL,FREET3,T3FREE,THYROIDAB in the last 72 hours No results found for this basename: VITAMINB12:2,FOLATE:2,FERRITIN:2,TIBC:2,IRON:2,RETICCTPCT:2 in the last 72 hours  Studies/Results: Dg Chest Port 1 View  08/31/2011  *RADIOLOGY REPORT*  Clinical Data: Evaluate endotracheal tube  PORTABLE CHEST - 1 VIEW  Comparison: 08/30/2011; 08/29/2011; 08/28/2011  Findings:  Grossly unchanged enlarged cardiac silhouette and mediastinal contours post median sternotomy and CABG. Interval extubation and removal of enteric tube.  Stable positioning of left jugular approach central venous catheter with the tip either within the SVC, no azygos cannulation is not excluded.  Pulmonary vasculature remains indistinct.  The improved aeration of the bilateral lung bases with persistent perihilar and bibasilar opacities.  No definite pleural effusion or pneumothorax.  Grossly unchanged  bones.  IMPRESSION: 1.  Interval extubation and removal of the enteric tube.  No pneumothorax. 2.  Left jugular approach central venous catheter tip is either within the mid SVC, though azygos cannulation is not entirely excluded.  Continued attention on follow-up is recommended. 3.  Persistent findings of pulmonary edema. 4.  Improved aeration of the bilateral lung bases with persistent perihilar and basilar opacities, atelectasis or infiltrate.  Original Report Authenticated By: Waynard Reeds, M.D.   Medications: Scheduled Meds:   . antiseptic oral rinse  1 application  Mouth Rinse QID  . aspirin  81 mg Oral Daily  . brimonidine  1 drop Both Eyes BID  . clopidogrel  75 mg Oral Daily  . divalproex  250 mg Oral Daily  . furosemide  80 mg Oral BID  . heparin  5,000 Units Subcutaneous Q8H  . hydrALAZINE  25 mg Oral Q6H  . insulin aspart  0-15 Units Subcutaneous Q4H  . latanoprost  1 drop Both Eyes QHS  . olopatadine  1 drop Both Eyes BID  . pantoprazole (PROTONIX) IV  40 mg Intravenous Q24H  . piperacillin-tazobactam (ZOSYN)  IV  3.375 g Intravenous Q8H  . potassium chloride  40 mEq Oral BID  . simvastatin  20 mg Oral Daily  . sodium chloride  10 mL Intravenous Q12H  . sodium chloride  10 mL Intravenous Q12H  . timolol  1 drop Both Eyes BID  . vancomycin  1,250 mg Intravenous Q24H   Continuous Infusions:   . sodium chloride 10 mL/hr at 08/31/11 2000   PRN Meds:.ALPRAZolam, HYDROcodone-acetaminophen, sodium chloride, temazepam, DISCONTD: acetaminophen  Assessment/Plan: Patient Active Problem List  Diagnoses Date Noted  . Hypokalemia - improving. Check basic metabolic profile in a.m.  09/01/2011  . Acute respiratory failure - secondary to acute pulmonary edema secondary to volume overload secondary to acute illness and probable acute coronary ischemia and myocardial dysfunction  08/31/2011  . CAD (coronary artery disease), native coronary artery   . Diabetic foot ulcer - chronic with clean base, no current therapeutic changes  08/28/2011  . Cellulitis - day #5 antibiotic therapy. Will switch to Augmentin and discontinue Zosyn and vancomycin  07/30/2011  . SIRS (systemic inflammatory response syndrome) - resolved  07/29/2011  . Lactic acidosis - resolved  07/29/2011  . AKI (acute kidney injury) 07/29/2011  . Fever 07/29/2011  . CHF (congestive heart failure) - resolving  07/29/2011  . DM (diabetes mellitus) 07/29/2011  . PAD (peripheral artery disease) 07/29/2011  . Anxiety 07/29/2011  . Glaucoma 07/29/2011  . HLD (hyperlipidemia) 07/29/2011  .  BPH (benign prostatic hyperplasia) 07/29/2011  . Hypertensive heart disease without CHF   . Atherosclerosis of native arteries of the extremities with ulceration 04/29/2011  . Disposition  - plan to discharge in a.m. if stable No CODE BLUE  08/24/2009     LOS: 4 days   Wilhemenia Camba NEVILL 09/01/2011, 10:47 AM

## 2011-09-01 NOTE — Progress Notes (Signed)
CM spoke with pt with spouse at bedside concerning d/c planning. Pt previously active with [facility]. Per pt's wife would like to resume care with agency for [facility] services for dressing changes for husband's foot ulcer. Cm to leave sticky note, asking MD to enter resumption of care orders. Pt and family agree with d/c plan. No other DME or H[facility] services requested.  Nicholas [name] [phone]

## 2011-09-01 NOTE — Plan of Care (Signed)
Problem: Phase I Progression Outcomes Goal: Other Phase I Outcomes/Goals Outcome: Progressing Diabetes management in progress-0800 CBG-114-no coverage-monitoring. AW

## 2011-09-02 LAB — CBC
HCT: 43.2 % (ref 39.0–52.0)
Hemoglobin: 13.8 g/dL (ref 13.0–17.0)
MCH: 29.9 pg (ref 26.0–34.0)
MCV: 93.7 fL (ref 78.0–100.0)
RBC: 4.61 MIL/uL (ref 4.22–5.81)

## 2011-09-02 LAB — DIFFERENTIAL
Eosinophils Absolute: 0.2 10*3/uL (ref 0.0–0.7)
Eosinophils Relative: 3 % (ref 0–5)
Lymphs Abs: 2.6 10*3/uL (ref 0.7–4.0)
Monocytes Relative: 10 % (ref 3–12)

## 2011-09-02 LAB — GLUCOSE, CAPILLARY: Glucose-Capillary: 125 mg/dL — ABNORMAL HIGH (ref 70–99)

## 2011-09-02 LAB — BASIC METABOLIC PANEL
BUN: 25 mg/dL — ABNORMAL HIGH (ref 6–23)
Calcium: 9.7 mg/dL (ref 8.4–10.5)
GFR calc non Af Amer: 40 mL/min — ABNORMAL LOW (ref >90)
Glucose, Bld: 99 mg/dL (ref 70–99)

## 2011-09-02 MED ORDER — POTASSIUM CHLORIDE CRYS ER 20 MEQ PO TBCR
40.0000 meq | EXTENDED_RELEASE_TABLET | Freq: Three times a day (TID) | ORAL | Status: DC
  Administered 2011-09-02 – 2011-09-03 (×4): 40 meq via ORAL
  Filled 2011-09-02 (×7): qty 2

## 2011-09-02 NOTE — Progress Notes (Signed)
Subjective: Patient's blood pressure is elevated today and his potassium is very low. Still feeling quite weak. Not ready for discharge today but possibly tomorrow with replacement of potassium and one more day of antibiotic therapy. I and O yesterday -4460 cc!!  Objective: Weight change: -2.9 kg (-6 lb 6.3 oz)  Intake/Output Summary (Last 24 hours) at 09/02/11 0749 Last data filed at 09/02/11 0548  Gross per 24 hour  Intake    240 ml  Output   4700 ml  Net  -4460 ml   Filed Vitals:   09/01/11 1445 09/01/11 1800 09/01/11 2215 09/02/11 0547  BP: 164/84 165/79 174/90 184/72  Pulse: 76 79 69 75  Temp: 98 F (36.7 C) 99.1 F (37.3 C) 98.1 F (36.7 C) 97.1 F (36.2 C)  TempSrc: Oral Oral Oral Oral  Resp: 20 19 18 18   Height:      Weight:    119.2 kg (262 lb 12.6 oz)  SpO2: 95% 98% 94% 100%    General: no acute distress  Neuro: Awake, moves all ext, conversive and oriented  HEENT: PERRL, oropharynx clear Neck:  Left IJ in place  Cardiovascular: RRR, no murmurs  Lungs: Clear  Abdomen: Obese, bowel sounds present  Musculoskeletal: Anasasrca  Skin: L foot diabetic ulcer, clean base. L LE edematous / erythematous, but much improved since admission. Skin of lower extremities is wrinkled secondary to improved edema   Lab Results:  Basename 09/02/11 0430 09/01/11 0552  NA 141 142  K 3.0* 3.3*  CL 93* 97  CO2 36* 35*  GLUCOSE 99 108*  BUN 25* 24*  CREATININE 1.68* 1.80*  CALCIUM 9.7 9.1  MG -- --  PHOS -- --   No results found for this basename: AST:2,ALT:2,ALKPHOS:2,BILITOT:2,PROT:2,ALBUMIN:2 in the last 72 hours No results found for this basename: LIPASE:2,AMYLASE:2 in the last 72 hours  Basename 09/02/11 0430 08/31/11 0405  WBC 8.6 9.4  NEUTROABS 4.9 --  HGB 13.8 12.0*  HCT 43.2 37.5*  MCV 93.7 93.1  PLT 211 145*   No results found for this basename: CKTOTAL:3,CKMB:3,CKMBINDEX:3,TROPONINI:3 in the last 72 hours No components found with this basename:  POCBNP:3 No results found for this basename: DDIMER:2 in the last 72 hours No results found for this basename: HGBA1C:2 in the last 72 hours  Basename 08/31/11 0405  CHOL --  HDL --  LDLCALC --  TRIG 202*  CHOLHDL --  LDLDIRECT --   No results found for this basename: TSH,T4TOTAL,FREET3,T3FREE,THYROIDAB in the last 72 hours No results found for this basename: VITAMINB12:2,FOLATE:2,FERRITIN:2,TIBC:2,IRON:2,RETICCTPCT:2 in the last 72 hours  Studies/Results: No results found. Medications: Scheduled Meds:   . amoxicillin-clavulanate  1 tablet Oral Q12H  . antiseptic oral rinse  1 application Mouth Rinse QID  . aspirin  81 mg Oral Daily  . brimonidine  1 drop Both Eyes BID  . clopidogrel  75 mg Oral Daily  . divalproex  250 mg Oral Daily  . furosemide  80 mg Oral BID  . heparin  5,000 Units Subcutaneous Q8H  . hydrALAZINE  25 mg Oral Q6H  . insulin aspart  0-15 Units Subcutaneous Q4H  . latanoprost  1 drop Both Eyes QHS  . olopatadine  1 drop Both Eyes BID  . pantoprazole  40 mg Oral Q1200  . potassium chloride  40 mEq Oral TID  . simvastatin  20 mg Oral Daily  . sodium chloride  10 mL Intravenous Q12H  . sodium chloride  10 mL Intravenous Q12H  . timolol  1 drop Both Eyes BID  . DISCONTD: pantoprazole (PROTONIX) IV  40 mg Intravenous Q24H  . DISCONTD: piperacillin-tazobactam (ZOSYN)  IV  3.375 g Intravenous Q8H  . DISCONTD: vancomycin  1,250 mg Intravenous Q24H   Continuous Infusions:   . DISCONTD: sodium chloride 10 mL/hr at 08/31/11 2000   PRN Meds:.ALPRAZolam, HYDROcodone-acetaminophen, sodium chloride, temazepam  Assessment/Plan:  Patient Active Problem List   Diagnoses  Date Noted   .  Hypokalemia - patient is hypokalemic once again. We'll give 40 mEq of KCl x3 today and recheck in a.m.  09/01/2011   .  Acute respiratory failure - secondary to acute pulmonary edema secondary to volume overload secondary to acute illness and probable acute coronary ischemia and  myocardial dysfunction - resolved  08/31/2011   .  CAD (coronary artery disease), native coronary artery    .  Diabetic foot ulcer - chronic with clean base, no current therapeutic changes  08/28/2011   .  Cellulitis - day #6 antibiotic therapy. Now on Augmentin  07/30/2011   .  SIRS (systemic inflammatory response syndrome) - resolved  07/29/2011   .  Lactic acidosis - resolved  07/29/2011   .  AKI (acute kidney injury)  07/29/2011   .  Fever - resolved  07/29/2011   .  CHF - acute (congestive heart failure) - resolved  07/29/2011   .  DM (diabetes mellitus)  07/29/2011   .  PAD (peripheral artery disease)  07/29/2011   .  Anxiety  07/29/2011   .  Glaucoma  07/29/2011   .  HLD (hyperlipidemia)  07/29/2011   .  BPH (benign prostatic hyperplasia)  07/29/2011   .  Hypertensive heart disease without CHF - blood pressure up some today. Continue diuresis    .  Atherosclerosis of native arteries of the extremities with ulceration  04/29/2011   .  Disposition - plan to discharge in a.m. if stable  No CODE BLUE  08/24/2009        LOS: 5 days   Juleen Sorrels NEVILL 09/02/2011, 7:49 AM

## 2011-09-02 NOTE — Progress Notes (Signed)
Subjective:  Now on floor, alert and conversant, not SOB , no chest pain.  Objective:  Vital Signs in the last 24 hours: BP 184/72  Pulse 75  Temp(Src) 97.1 F (36.2 C) (Oral)  Resp 18  Ht 5\' 9"  (1.753 m)  Wt 119.2 kg (262 lb 12.6 oz)  BMI 38.81 kg/m2  SpO2 100%  Physical Exam: Severely obese [place] male in NAD Lungs:clear  Cardiac:  Regular rhythm, normal S1 and S2, no S3 Extremities: 2+ edema present left leg with ulcer on foot and significant celluitis  Intake/Output from previous day: 01/27 0701 - 01/28 0700 In: 240 [P.O.:240] Out: 4700 [Urine:4700] Weight change: -2.9 kg (-6 lb 6.3 oz)  Down 20 pounds from admission  Lab Results: Basic Metabolic Panel:  Basename 09/02/11 0430 09/01/11 0552  NA 141 142  K 3.0* 3.3*  CL 93* 97  CO2 36* 35*  GLUCOSE 99 108*  BUN 25* 24*  CREATININE 1.68* 1.80*   CBC:  Basename 09/02/11 0430 08/31/11 0405  WBC 8.6 9.4  NEUTROABS 4.9 --  HGB 13.8 12.0*  HCT 43.2 37.5*  MCV 93.7 93.1  PLT 211 145*   Telemetry: Reviewed : sinus rhythm  Assessment/Plan:  1. Pulmonary edema multifactorial due to hypertensive urgency and demand ischemia May be pneumonic also 2. Sepsis syndrome  3. Cellultis  4. Coronary artery disease With previous redo bypass grafting not a candidate for additional surgery  5. Abnormal troponin and CPK-MB likely due to demand ischemia not primary event 6. Acute renal failure resolving Rec:  REC:  Clinically improved.  Denies SOB and diuresing.  Stopping Lasix at home likely led to current volume overload.  Discussed with him.  Will sign off.  He requested Rx for NTG.  Needs good BP control.  Darden Palmer.  MD [facility] 09/02/2011, 9:37 AM

## 2011-09-03 LAB — BASIC METABOLIC PANEL
BUN: 39 mg/dL — ABNORMAL HIGH (ref 6–23)
CO2: 32 mEq/L (ref 19–32)
Chloride: 94 mEq/L — ABNORMAL LOW (ref 96–112)
Creatinine, Ser: 1.91 mg/dL — ABNORMAL HIGH (ref 0.50–1.35)
GFR calc Af Amer: 40 mL/min — ABNORMAL LOW (ref >90)

## 2011-09-03 LAB — GLUCOSE, CAPILLARY
Glucose-Capillary: 143 mg/dL — ABNORMAL HIGH (ref 70–99)
Glucose-Capillary: 187 mg/dL — ABNORMAL HIGH (ref 70–99)

## 2011-09-03 MED ORDER — AMOXICILLIN-POT CLAVULANATE 875-125 MG PO TABS: 1.0000 | ORAL_TABLET | Freq: Two times a day (BID) | ORAL | Status: AC

## 2011-09-03 MED ORDER — ASPIRIN 81 MG PO CHEW: 81.0000 mg | CHEWABLE_TABLET | Freq: Every day | ORAL | Status: DC

## 2011-09-03 MED ORDER — POTASSIUM CHLORIDE CRYS ER 20 MEQ PO TBCR: 40.0000 meq | EXTENDED_RELEASE_TABLET | Freq: Two times a day (BID) | ORAL | Status: DC

## 2011-09-03 NOTE — Discharge Summary (Signed)
Physician Discharge Summary  NAME:Nicholas [name]  JXB:[id number]  DOB: 01-Jun-1941   Admit date: 08/28/2011 Discharge date: 09/03/2011  Discharge Diagnoses:  Principal Problem:  SIRS (systemic inflammatory response syndrome) - resolved Active Problems:  Fever - resolved  CHF (congestive heart failure) - patient had flash pulmonary edema secondary to volume overload and stress from infection. Treated with mechanical ventilation and diuresis with resolution of symptoms  DM (diabetes mellitus) - stable  PAD (peripheral artery disease) - stable  Cellulitis - improved  Diabetic foot ulcer - chronic and stable  CAD (coronary artery disease), native coronary artery - chronic and stable  Acute respiratory failure - resolved  Hypokalemia - improved  Do Not Resuscitate - patient wants no further mechanical ventilation or intubation or cardiac resuscitation in the event of a cardiac arrest  Discharge Condition: Much improved  Hospital Course: [name] is a very pleasant 71 year old male with multiple medical issues. He received a right knee steroid injection approximately one week prior to admission. On the day of admission, he developed fever confusion and worsening left lower leg edema and erythema. He was evaluated in the [facility] emergency room and there was no evidence of osteomyelitis and he was admitted and antibiotics were started. Shortly after admission, he developed severe respiratory distress and had flash pulmonary edema. He apparently was struggling to get out of the bed and was confused prior to his sudden episode of flash pulmonary edema. He was intubated and transferred to the ICU and treated with intravenous diuresis and after approximately 48 hours was extubated successfully. He responded very well to resumption of furosemide which she had stopped on his own at home. he had reasoned that he did not need to take his Lasix if he just reduced his fluid intake. He is now aware  that he needs to take his diuretics daily. He has a history of CHF. While hospitalized, his diabetes was well controlled. He was also seen in consultation by Dr. Viann Fish for congestive heart failure. He was not felt to be a candidate for further cardiac intervention invasively. He'll be discharged home in improved condition and he will continue Augmentin for cellulitis for 10 more days and will be seen back in my office on 09/09/2011 for followup  Patient Active Problem List   Diagnoses  Date Noted   .  Hypokalemia -  normalized  09/01/2011   .  Acute respiratory failure - secondary to acute pulmonary edema secondary to volume overload secondary to acute illness and probable acute coronary ischemia and myocardial dysfunction - resolved  08/31/2011   .  CAD (coronary artery disease), native coronary artery    .  Diabetic foot ulcer - chronic with clean base, no current therapeutic changes  08/28/2011   .  Cellulitis - day #7 antibiotic therapy. Now on Augmentin  07/30/2011   .  SIRS (systemic inflammatory response syndrome) - resolved  07/29/2011   .  Lactic acidosis - resolved  07/29/2011   .  AKI (acute kidney injury)  07/29/2011   .  Fever - resolved  07/29/2011   .  CHF - acute (congestive heart failure) - resolved  07/29/2011   .  DM (diabetes mellitus)  07/29/2011   .  PAD (peripheral artery disease)  07/29/2011   .  Anxiety  07/29/2011   .  Glaucoma  07/29/2011   .  HLD (hyperlipidemia)  07/29/2011   .  BPH (benign prostatic hyperplasia)  07/29/2011   .  Hypertensive  heart disease without CHF - blood pressure controlled at discharge    .  Atherosclerosis of native arteries of the extremities with ulceration  04/29/2011      Filed Vitals:   09/02/11 1400 09/02/11 2120 09/02/11 2300 09/03/11 0507  BP: 117/70 135/85 127/82 119/71  Pulse: 73 75 80 76  Temp: 98.3 F (36.8 C) 97.8 F (36.6 C)  98.5 F (36.9 C)  TempSrc: Oral Oral  Oral  Resp: 18 19  20   Height:        Weight:    119.7 kg (263 lb 14.3 oz)  SpO2: 93% 94% 94% 91%    General: no acute distress  Neuro: Awake, moves all ext, conversive and oriented  HEENT: PERRL, oropharynx clear  Neck: Left IJ in place  Cardiovascular: RRR, no murmurs  Lungs: Clear  Abdomen: Obese, bowel sounds present  Musculoskeletal: Anasasrca  Skin: L foot diabetic ulcer, clean base. L LE edematous / erythematous, but much improved since admission. Skin of lower extremities is wrinkled secondary to improved edema   Consults: Treatment Team:   Cardiology: Darden Palmer., MD;  pulmonary critical care medicine  Disposition: Discharge home in company of family  Discharge Orders    Future Appointments: Provider: Department: Dept Phone: Center:   09/09/2011 9:00 AM [facility] [phone] [facility]   09/17/2011 12:00 PM [facility] [phone] [facility]   09/17/2011 12:30 PM [facility] [phone] [facility]   06/22/2012 11:45 AM V Durene Cal, MD [facility] [phone] [facility]     Future Orders Please Complete By Expires   Diet - low sodium heart healthy      Increase activity slowly      Discharge instructions      Comments:   Call Berlinda at [phone] for appointment on February 4. Call physician for temperature greater than 100F or if lower extremities become swollen again or if left foot and leg become more red   Change dressing (specify)      Comments:   Continue dressing changes on left foot arch ulcer as per prior to admission to the hospital     Medication List  As of 09/03/2011  7:48 AM   TAKE these medications         allopurinol 300 MG tablet   Commonly known as: ZYLOPRIM   Take 300 mg by mouth daily.      ALPRAZolam 1 MG tablet   Commonly known as: XANAX   Take 1-2 mg by mouth 3 (three) times daily. Takes 1 tablet in the morning and at 430 pm and 2 tablets at bedtime.      amoxicillin-clavulanate 875-125 MG per tablet   Commonly known as:  AUGMENTIN   Take 1 tablet by mouth every 12 (twelve) hours.      aspirin 81 MG chewable tablet   Chew 1 tablet (81 mg total) by mouth daily.      atenolol 50 MG tablet   Commonly known as: TENORMIN   Take 50 mg by mouth 2 (two) times daily.      clopidogrel 75 MG tablet   Commonly known as: PLAVIX   Take 75 mg by mouth daily.      COMBIGAN 0.2-0.5 % ophthalmic solution   Generic drug: brimonidine-timolol   Place 1 drop into both eyes every 12 (twelve) hours.      divalproex 250 MG 24 hr tablet   Commonly known as: DEPAKOTE ER   Take 250 mg by  mouth daily.      furosemide 80 MG tablet   Commonly known as: LASIX   Take 80 mg by mouth 2 (two) times daily.      glipiZIDE 5 MG tablet   Commonly known as: GLUCOTROL   Take 5 mg by mouth 2 (two) times daily before a meal.      HYDROcodone-acetaminophen 7.5-750 MG per tablet   Commonly known as: VICODIN ES   Take 1 tablet by mouth every 6 (six) hours as needed. For pain      insulin glargine 100 UNIT/ML injection   Commonly known as: LANTUS   Inject 5-10 Units into the skin at bedtime as needed.      latanoprost 0.005 % ophthalmic solution   Commonly known as: XALATAN   Place 1 drop into both eyes at bedtime.      metolazone 5 MG tablet   Commonly known as: ZAROXOLYN   Take 5 mg by mouth 2 (two) times a week. Takes on Mondays and Fridays      nitroGLYCERIN 0.4 MG SL tablet   Commonly known as: NITROSTAT   Place 0.4 mg under the tongue every 5 (five) minutes as needed. For chest pain      olopatadine 0.1 % ophthalmic solution   Commonly known as: PATANOL   Place 1 drop into both eyes 2 (two) times daily.      omeprazole 20 MG capsule   Commonly known as: PRILOSEC   Take 20 mg by mouth 2 (two) times daily as needed. For heart burn      potassium chloride SA 20 MEQ tablet   Commonly known as: K-DUR,KLOR-CON   Take 2 tablets (40 mEq total) by mouth 2 (two) times daily.      simvastatin 20 MG tablet   Commonly known  as: ZOCOR   Take 20 mg by mouth daily.      temazepam 30 MG capsule   Commonly known as: RESTORIL   Take 30 mg by mouth at bedtime.      timolol 0.5 % ophthalmic solution   Commonly known as: BETIMOL   Place 1 drop into the left eye 2 (two) times daily.           Follow-up Information    Follow up with Emunah Texidor NEVILL, MD .        Things to follow up in the outpatient setting: Return of fever or confusion or leg edema  Time coordinating discharge: 40 minutes  The results of significant diagnostics from this hospitalization (including imaging, microbiology, ancillary and laboratory) are listed below for reference.    Significant Diagnostic Studies: Dg Chest 2 View  08/28/2011  *RADIOLOGY REPORT*  Clinical Data: Fever and shortness of breath.  Weakness.  CHEST - 2 VIEW  Comparison: Portable chest 07/29/2011.  Findings: The heart is enlarged.  Mild edema is present. No focal airspace disease is evident.  The lateral view is obscured by motion.  Mild degenerative changes of the thoracic spine are stable.  IMPRESSION:  1.  Cardiomegaly and mild edema, compatible with congestive heart failure.  Original Report Authenticated By: Jamesetta Orleans. MATTERN, M.D.   Dg Chest Port 1 View  08/31/2011  *RADIOLOGY REPORT*  Clinical Data: Evaluate endotracheal tube  PORTABLE CHEST - 1 VIEW  Comparison: 08/30/2011; 08/29/2011; 08/28/2011  Findings:  Grossly unchanged enlarged cardiac silhouette and mediastinal contours post median sternotomy and CABG. Interval extubation and removal of enteric tube.  Stable positioning of left jugular approach central venous  catheter with the tip either within the SVC, no azygos cannulation is not excluded.  Pulmonary vasculature remains indistinct.  The improved aeration of the bilateral lung bases with persistent perihilar and bibasilar opacities.  No definite pleural effusion or pneumothorax.  Grossly unchanged bones.  IMPRESSION: 1.  Interval extubation and  removal of the enteric tube.  No pneumothorax. 2.  Left jugular approach central venous catheter tip is either within the mid SVC, though azygos cannulation is not entirely excluded.  Continued attention on follow-up is recommended. 3.  Persistent findings of pulmonary edema. 4.  Improved aeration of the bilateral lung bases with persistent perihilar and basilar opacities, atelectasis or infiltrate.  Original Report Authenticated By: Waynard Reeds, M.D.   Dg Chest Port 1 View  08/30/2011  *RADIOLOGY REPORT*  Clinical Data: Ventilator patient.  CHF.  PORTABLE CHEST - 1 VIEW  Comparison: 08/29/2011.  Findings: Limited portable exam with chin overlying the lung apices and inferior aspect of the lungs not entirely included.  Endotracheal tube tip 7.8 cm above the carina.  Left central line tip proximal superior vena cava level directed laterally.  No gross pneumothorax.  Post CABG.  Cardiomegaly.  Tortuous aorta.  Asymmetric air space disease most notable lung bases.  This appears to have progressed slightly since prior examination which may be partially explained by projection.  This may represent pulmonary edema although infectious infiltrate not entirely excluded.  IMPRESSION: Mild progression of asymmetric air space disease as noted above.  Original Report Authenticated By: Fuller Canada, M.D.   Dg Chest Port 1 View  08/29/2011  *RADIOLOGY REPORT*  Clinical Data: Respiratory distress; status post intubation.  PORTABLE CHEST - 1 VIEW  Comparison: Chest radiograph performed earlier today at 05:24 a.m.  Findings: The patient's endotracheal tube is seen ending 2 cm above the carina.  An enteric tube is noted apparently ending at the mid esophagus.  Persistent bilateral airspace opacities raise concern for pulmonary edema.  Small bilateral pleural effusions are again seen.  No pneumothorax is identified.  The cardiomediastinal silhouette is enlarged; the patient is status post median sternotomy, with evidence  of prior CABG.  No acute osseous abnormalities are seen.  IMPRESSION:  1.  Endotracheal tube seen ending 2 cm above the carina. 2.  Enteric tube appears to end at the mid esophagus. 3.  Persistent pulmonary edema, slightly less prominent than on the prior study; small bilateral pleural effusions again seen. 4.  Cardiomegaly again noted.  Original Report Authenticated By: Tonia Ghent, M.D.   Dg Chest Port 1 View  08/29/2011  *RADIOLOGY REPORT*  Clinical Data: Respiratory distress.  PORTABLE CHEST - 1 VIEW  Comparison: Chest radiograph performed 08/28/2011  Findings: The lungs are well-aerated.  Significant vascular congestion is noted, with diffuse bilateral airspace opacities, concerning for significant pulmonary edema.  Small bilateral pleural effusions are noted.  No pneumothorax is seen.  The cardiomediastinal silhouette is mildly enlarged; the patient is status post median sternotomy.  No acute osseous abnormalities are seen.  IMPRESSION: Significant vascular congestion and mild cardiomegaly, with diffuse bilateral airspace opacities, concerning for significant pulmonary edema.  Small bilateral pleural effusions seen.  Findings were discussed with Maralyn Sago RN in the [facility] at 06:05 a.m. on 08/29/2011.  Original Report Authenticated By: Tonia Ghent, M.D.   Dg Chest Port 1v Same Day  08/29/2011  *RADIOLOGY REPORT*  Clinical Data: Evaluate central venous catheter placement  PORTABLE CHEST - 1 VIEW SAME DAY  Comparison: 08/29/2011  Findings:  The endotracheal tube tip is above the carina.  There is a left IJ catheter with tip in the SVC.  No pneumothorax is identified.  Nasogastric tube tip is in the stomach.  Stable cardiac enlargement and pulmonary edema.  IMPRESSION:  1.  No change in aeration to the lungs compared with previous exam. 2.  Tip of the central venous catheter is in the SVC.  Original Report Authenticated By: Rosealee Albee, M.D.   Dg Foot Complete Left  08/28/2011  *RADIOLOGY  REPORT*  Clinical Data: Wound of the plantar surface left foot.  Question osteomyelitis.  LEFT FOOT - COMPLETE 3+ VIEW  Comparison: Plain films left foot 07/28/2010.  Findings: Soft tissues of the foot are massively swollen.  No plain film evidence of osteomyelitis is identified.  Midfoot degenerative disease or Charcot change has progressed.  Plantar calcaneal spur noted.  IMPRESSION: Massive soft tissue swelling.  No plain film evidence of osteomyelitis.  Original Report Authenticated By: Bernadene Bell. Maricela Curet, M.D.    Microbiology: Recent Results (from the past 240 hour(s))  CULTURE, BLOOD (ROUTINE X 2)     Status: Normal (Preliminary result)   Collection Time   08/28/11  8:50 PM      Component Value Range Status Comment   Specimen Description BLOOD RIGHT ANTECUBITAL   Final    Special Requests BOTTLES DRAWN AEROBIC ONLY 3 CC   Final    Culture  Setup Time [id number]   Final    Culture     Final    Value:        BLOOD CULTURE RECEIVED NO GROWTH TO DATE CULTURE WILL BE HELD FOR 5 DAYS BEFORE ISSUING A FINAL NEGATIVE REPORT   Report Status PENDING   Incomplete   CULTURE, BLOOD (ROUTINE X 2)     Status: Normal (Preliminary result)   Collection Time   08/29/11 12:50 AM      Component Value Range Status Comment   Specimen Description BLOOD RIGHT ARM   Final    Special Requests BOTTLES DRAWN AEROBIC AND ANAEROBIC 5 CC EACH   Final    Culture  Setup Time [id number]   Final    Culture     Final    Value:        BLOOD CULTURE RECEIVED NO GROWTH TO DATE CULTURE WILL BE HELD FOR 5 DAYS BEFORE ISSUING A FINAL NEGATIVE REPORT   Report Status PENDING   Incomplete   MRSA PCR SCREENING     Status: Normal   Collection Time   08/29/11  7:57 AM      Component Value Range Status Comment   MRSA by PCR NEGATIVE  NEGATIVE  Final   WOUND CULTURE     Status: Normal   Collection Time   08/29/11 12:00 PM      Component Value Range Status Comment   Specimen Description FOOT LEFT   Final    Special Requests  Normal   Final    Gram Stain     Final    Value: NO WBC SEEN     NO SQUAMOUS EPITHELIAL CELLS SEEN     RARE GRAM POSITIVE RODS   Culture     Final    Value: MULTIPLE ORGANISMS PRESENT, NONE PREDOMINANT     Note: NO STAPHYLOCOCCUS AUREUS ISOLATED NO GROUP A STREP (S.PYOGENES) ISOLATED   Report Status 09/01/2011 FINAL   Final      Labs: Results for orders placed during the hospital encounter of 08/28/11  CBC  Component Value Range   WBC 16.3 (*) 4.0 - 10.5 (K/uL)   RBC 4.83  4.22 - 5.81 (MIL/uL)   Hemoglobin 14.7  13.0 - 17.0 (g/dL)   HCT 47.8  29.5 - 62.1 (%)   MCV 95.2  78.0 - 100.0 (fL)   MCH 30.4  26.0 - 34.0 (pg)   MCHC 32.0  30.0 - 36.0 (g/dL)   RDW 30.8  65.7 - 84.6 (%)   Platelets 172  150 - 400 (K/uL)  DIFFERENTIAL      Component Value Range   Neutrophils Relative 89 (*) 43 - 77 (%)   Neutro Abs 14.5 (*) 1.7 - 7.7 (K/uL)   Lymphocytes Relative 5 (*) 12 - 46 (%)   Lymphs Abs 0.9  0.7 - 4.0 (K/uL)   Monocytes Relative 5  3 - 12 (%)   Monocytes Absolute 0.9  0.1 - 1.0 (K/uL)   Eosinophils Relative 0  0 - 5 (%)   Eosinophils Absolute 0.0  0.0 - 0.7 (K/uL)   Basophils Relative 0  0 - 1 (%)   Basophils Absolute 0.0  0.0 - 0.1 (K/uL)  COMPREHENSIVE METABOLIC PANEL      Component Value Range   Sodium 137  135 - 145 (mEq/L)   Potassium 5.3 (*) 3.5 - 5.1 (mEq/L)   Chloride 100  96 - 112 (mEq/L)   CO2 29  19 - 32 (mEq/L)   Glucose, Bld 108 (*) 70 - 99 (mg/dL)   BUN 13  6 - 23 (mg/dL)   Creatinine, Ser 9.62  0.50 - 1.35 (mg/dL)   Calcium 9.3  8.4 - 95.2 (mg/dL)   Total Protein 7.3  6.0 - 8.3 (g/dL)   Albumin 3.3 (*) 3.5 - 5.2 (g/dL)   AST 13  0 - 37 (U/L)   ALT 9  0 - 53 (U/L)   Alkaline Phosphatase 97  39 - 117 (U/L)   Total Bilirubin 0.3  0.3 - 1.2 (mg/dL)   GFR calc non Af Amer 70 (*) >90 (mL/min)   GFR calc Af Amer 81 (*) >90 (mL/min)  LIPASE, BLOOD      Component Value Range   Lipase 20  11 - 59 (U/L)  LACTIC ACID, PLASMA      Component Value Range    Lactic Acid, Venous 2.1  0.5 - 2.2 (mmol/L)  URINALYSIS, ROUTINE W REFLEX MICROSCOPIC      Component Value Range   Color, Urine YELLOW  YELLOW    APPearance CLEAR  CLEAR    Specific Gravity, Urine 1.015  1.005 - 1.030    pH 7.5  5.0 - 8.0    Glucose, UA NEGATIVE  NEGATIVE (mg/dL)   Hgb urine dipstick TRACE (*) NEGATIVE    Bilirubin Urine NEGATIVE  NEGATIVE    Ketones, ur NEGATIVE  NEGATIVE (mg/dL)   Protein, ur >841 (*) NEGATIVE (mg/dL)   Urobilinogen, UA 0.2  0.0 - 1.0 (mg/dL)   Nitrite NEGATIVE  NEGATIVE    Leukocytes, UA NEGATIVE  NEGATIVE   URINE MICROSCOPIC-ADD ON      Component Value Range   Squamous Epithelial / LPF RARE  RARE    RBC / HPF 3-6  <3 (RBC/hpf)   Casts HYALINE CASTS (*) NEGATIVE   HEMOGLOBIN A1C      Component Value Range   Hemoglobin A1C 6.2 (*) <5.7 (%)   Mean Plasma Glucose 131 (*) <117 (mg/dL)  WOUND CULTURE      Component Value Range   Specimen Description FOOT  LEFT     Special Requests Normal     Gram Stain       Value: NO WBC SEEN     NO SQUAMOUS EPITHELIAL CELLS SEEN     RARE GRAM POSITIVE RODS   Culture       Value: MULTIPLE ORGANISMS PRESENT, NONE PREDOMINANT     Note: NO STAPHYLOCOCCUS AUREUS ISOLATED NO GROUP A STREP (S.PYOGENES) ISOLATED   Report Status 09/01/2011 FINAL    CULTURE, BLOOD (ROUTINE X 2)      Component Value Range   Specimen Description BLOOD RIGHT ARM     Special Requests BOTTLES DRAWN AEROBIC AND ANAEROBIC 5 CC EACH     Culture  Setup Time [id number]     Culture       Value:        BLOOD CULTURE RECEIVED NO GROWTH TO DATE CULTURE WILL BE HELD FOR 5 DAYS BEFORE ISSUING A FINAL NEGATIVE REPORT   Report Status PENDING    CULTURE, BLOOD (ROUTINE X 2)      Component Value Range   Specimen Description BLOOD RIGHT ANTECUBITAL     Special Requests BOTTLES DRAWN AEROBIC ONLY 3 CC     Culture  Setup Time [id number]     Culture       Value:        BLOOD CULTURE RECEIVED NO GROWTH TO DATE CULTURE WILL BE HELD FOR 5 DAYS BEFORE  ISSUING A FINAL NEGATIVE REPORT   Report Status PENDING    CBC      Component Value Range   WBC 14.1 (*) 4.0 - 10.5 (K/uL)   RBC 4.26  4.22 - 5.81 (MIL/uL)   Hemoglobin 12.5 (*) 13.0 - 17.0 (g/dL)   HCT 40.1  02.7 - 25.3 (%)   MCV 94.1  78.0 - 100.0 (fL)   MCH 29.3  26.0 - 34.0 (pg)   MCHC 31.2  30.0 - 36.0 (g/dL)   RDW 66.4  40.3 - 47.4 (%)   Platelets 143 (*) 150 - 400 (K/uL)  CREATININE, SERUM      Component Value Range   Creatinine, Ser 1.10  0.50 - 1.35 (mg/dL)   GFR calc non Af Amer 67 (*) >90 (mL/min)   GFR calc Af Amer 77 (*) >90 (mL/min)  CBC      Component Value Range   WBC 15.8 (*) 4.0 - 10.5 (K/uL)   RBC 4.44  4.22 - 5.81 (MIL/uL)   Hemoglobin 13.7  13.0 - 17.0 (g/dL)   HCT 25.9  56.3 - 87.5 (%)   MCV 95.0  78.0 - 100.0 (fL)   MCH 30.9  26.0 - 34.0 (pg)   MCHC 32.5  30.0 - 36.0 (g/dL)   RDW 64.3 (*) 32.9 - 15.5 (%)   Platelets 142 (*) 150 - 400 (K/uL)  BASIC METABOLIC PANEL      Component Value Range   Sodium 135  135 - 145 (mEq/L)   Potassium 5.4 (*) 3.5 - 5.1 (mEq/L)   Chloride 101  96 - 112 (mEq/L)   CO2 26  19 - 32 (mEq/L)   Glucose, Bld 68 (*) 70 - 99 (mg/dL)   BUN 14  6 - 23 (mg/dL)   Creatinine, Ser 5.18  0.50 - 1.35 (mg/dL)   Calcium 8.8  8.4 - 84.1 (mg/dL)   GFR calc non Af Amer 73 (*) >90 (mL/min)   GFR calc Af Amer 85 (*) >90 (mL/min)  BLOOD GAS, ARTERIAL      Component Value  Range   FIO2 1.00     Delivery systems VENTILATOR     Mode PRESSURE REGULATED VOLUME CONTROL     VT 600     Rate 15     Peep/cpap 5.0     pH, Arterial 7.276 (*) 7.350 - 7.450    pCO2 arterial 58.0 (*) 35.0 - 45.0 (mmHg)   pO2, Arterial 154.0 (*) 80.0 - 100.0 (mmHg)   Bicarbonate 25.2 (*) 20.0 - 24.0 (mEq/L)   TCO2 22.4  0 - 100 (mmol/L)   Acid-base deficit 1.3  0.0 - 2.0 (mmol/L)   O2 Saturation 98.7     Patient temperature 39.4     Collection site RIGHT RADIAL     Drawn by [phone]     Sample type ARTERIAL DRAW     Allens test (pass/fail) PASS  PASS   CARDIAC  PANEL(CRET KIN+CKTOT+MB+TROPI)      Component Value Range   Total CK 148  7 - 232 (U/L)   CK, MB 10.8 (*) 0.3 - 4.0 (ng/mL)   Troponin I 6.76 (*) <0.30 (ng/mL)   Relative Index 7.3 (*) 0.0 - 2.5   CARDIAC PANEL(CRET KIN+CKTOT+MB+TROPI)      Component Value Range   Total CK 184  7 - 232 (U/L)   CK, MB 10.7 (*) 0.3 - 4.0 (ng/mL)   Troponin I 6.66 (*) <0.30 (ng/mL)   Relative Index 5.8 (*) 0.0 - 2.5   CARDIAC PANEL(CRET KIN+CKTOT+MB+TROPI)      Component Value Range   Total CK 160  7 - 232 (U/L)   CK, MB 7.3 (*) 0.3 - 4.0 (ng/mL)   Troponin I 5.32 (*) <0.30 (ng/mL)   Relative Index 4.6 (*) 0.0 - 2.5   MRSA PCR SCREENING      Component Value Range   MRSA by PCR NEGATIVE  NEGATIVE   GLUCOSE, CAPILLARY      Component Value Range   Glucose-Capillary 107 (*) 70 - 99 (mg/dL)  AMYLASE      Component Value Range   Amylase 21  0 - 105 (U/L)  LIPASE, BLOOD      Component Value Range   Lipase 15  11 - 59 (U/L)  PROCALCITONIN      Component Value Range   Procalcitonin 31.09    LACTIC ACID, PLASMA      Component Value Range   Lactic Acid, Venous 1.2  0.5 - 2.2 (mmol/L)  CORTISOL      Component Value Range   Cortisol, Plasma 15.8    GLUCOSE, CAPILLARY      Component Value Range   Glucose-Capillary 79  70 - 99 (mg/dL)   Comment 1 Documented in Chart     Comment 2 Notify RN    GLUCOSE, CAPILLARY      Component Value Range   Glucose-Capillary 97  70 - 99 (mg/dL)   Comment 1 Documented in Chart     Comment 2 Notify RN    CBC      Component Value Range   WBC 11.2 (*) 4.0 - 10.5 (K/uL)   RBC 3.89 (*) 4.22 - 5.81 (MIL/uL)   Hemoglobin 11.7 (*) 13.0 - 17.0 (g/dL)   HCT 04.5 (*) 40.9 - 52.0 (%)   MCV 93.8  78.0 - 100.0 (fL)   MCH 30.1  26.0 - 34.0 (pg)   MCHC 32.1  30.0 - 36.0 (g/dL)   RDW 81.1 (*) 91.4 - 15.5 (%)   Platelets 141 (*) 150 - 400 (K/uL)  BLOOD GAS,  ARTERIAL      Component Value Range   FIO2 0.40     Delivery systems VENTILATOR     Mode PRESSURE REGULATED VOLUME  CONTROL     VT 600     Rate 15     Peep/cpap 5.0     pH, Arterial 7.428  7.350 - 7.450    pCO2 arterial 38.8  35.0 - 45.0 (mmHg)   pO2, Arterial 90.8  80.0 - 100.0 (mmHg)   Bicarbonate 25.2 (*) 20.0 - 24.0 (mEq/L)   TCO2 22.7  0 - 100 (mmol/L)   Acid-Base Excess 1.4  0.0 - 2.0 (mmol/L)   O2 Saturation 97.9     Patient temperature 37.0     Drawn by [id number]     Sample type ARTERIAL DRAW    BASIC METABOLIC PANEL      Component Value Range   Sodium 141  135 - 145 (mEq/L)   Potassium 3.5  3.5 - 5.1 (mEq/L)   Chloride 103  96 - 112 (mEq/L)   CO2 27  19 - 32 (mEq/L)   Glucose, Bld 74  70 - 99 (mg/dL)   BUN 25 (*) 6 - 23 (mg/dL)   Creatinine, Ser 1.30 (*) 0.50 - 1.35 (mg/dL)   Calcium 8.2 (*) 8.4 - 10.5 (mg/dL)   GFR calc non Af Amer 33 (*) >90 (mL/min)   GFR calc Af Amer 38 (*) >90 (mL/min)  LACTIC ACID, PLASMA      Component Value Range   Lactic Acid, Venous 1.1  0.5 - 2.2 (mmol/L)  PRO B NATRIURETIC PEPTIDE      Component Value Range   Pro B Natriuretic peptide (BNP) 7167.0 (*) 0 - 125 (pg/mL)  PROCALCITONIN      Component Value Range   Procalcitonin 30.54    GLUCOSE, CAPILLARY      Component Value Range   Glucose-Capillary 79  70 - 99 (mg/dL)   Comment 1 Documented in Chart     Comment 2 Notify RN    GLUCOSE, CAPILLARY      Component Value Range   Glucose-Capillary 83  70 - 99 (mg/dL)   Comment 1 Documented in Chart     Comment 2 Notify RN    GLUCOSE, CAPILLARY      Component Value Range   Glucose-Capillary 85  70 - 99 (mg/dL)  GLUCOSE, CAPILLARY      Component Value Range   Glucose-Capillary 78  70 - 99 (mg/dL)  VANCOMYCIN, RANDOM      Component Value Range   Vancomycin Rm 26.4    GLUCOSE, CAPILLARY      Component Value Range   Glucose-Capillary 71  70 - 99 (mg/dL)   Comment 1 Documented in Chart     Comment 2 Notify RN    BASIC METABOLIC PANEL      Component Value Range   Sodium 138  135 - 145 (mEq/L)   Potassium 3.6  3.5 - 5.1 (mEq/L)   Chloride 100  96 -  112 (mEq/L)   CO2 27  19 - 32 (mEq/L)   Glucose, Bld 84  70 - 99 (mg/dL)   BUN 25 (*) 6 - 23 (mg/dL)   Creatinine, Ser 8.65 (*) 0.50 - 1.35 (mg/dL)   Calcium 8.5  8.4 - 78.4 (mg/dL)   GFR calc non Af Amer 34 (*) >90 (mL/min)   GFR calc Af Amer 40 (*) >90 (mL/min)  GLUCOSE, CAPILLARY      Component Value Range   Glucose-Capillary  84  70 - 99 (mg/dL)   Comment 1 Documented in Chart     Comment 2 Notify RN    GLUCOSE, CAPILLARY      Component Value Range   Glucose-Capillary 87  70 - 99 (mg/dL)   Comment 1 Documented in Chart     Comment 2 Notify RN    TRIGLYCERIDES      Component Value Range   Triglycerides 202 (*) <150 (mg/dL)  PROCALCITONIN      Component Value Range   Procalcitonin 19.16    CBC      Component Value Range   WBC 9.4  4.0 - 10.5 (K/uL)   RBC 4.03 (*) 4.22 - 5.81 (MIL/uL)   Hemoglobin 12.0 (*) 13.0 - 17.0 (g/dL)   HCT 13.0 (*) 86.5 - 52.0 (%)   MCV 93.1  78.0 - 100.0 (fL)   MCH 29.8  26.0 - 34.0 (pg)   MCHC 32.0  30.0 - 36.0 (g/dL)   RDW 78.4  69.6 - 29.5 (%)   Platelets 145 (*) 150 - 400 (K/uL)  BASIC METABOLIC PANEL      Component Value Range   Sodium 141  135 - 145 (mEq/L)   Potassium 2.9 (*) 3.5 - 5.1 (mEq/L)   Chloride 101  96 - 112 (mEq/L)   CO2 30  19 - 32 (mEq/L)   Glucose, Bld 159 (*) 70 - 99 (mg/dL)   BUN 26 (*) 6 - 23 (mg/dL)   Creatinine, Ser 2.84 (*) 0.50 - 1.35 (mg/dL)   Calcium 8.3 (*) 8.4 - 10.5 (mg/dL)   GFR calc non Af Amer 35 (*) >90 (mL/min)   GFR calc Af Amer 40 (*) >90 (mL/min)  GLUCOSE, CAPILLARY      Component Value Range   Glucose-Capillary 110 (*) 70 - 99 (mg/dL)   Comment 1 Documented in Chart     Comment 2 Notify RN    GLUCOSE, CAPILLARY      Component Value Range   Glucose-Capillary 100 (*) 70 - 99 (mg/dL)  GLUCOSE, CAPILLARY      Component Value Range   Glucose-Capillary 159 (*) 70 - 99 (mg/dL)  GLUCOSE, CAPILLARY      Component Value Range   Glucose-Capillary 113 (*) 70 - 99 (mg/dL)  GLUCOSE, CAPILLARY       Component Value Range   Glucose-Capillary 157 (*) 70 - 99 (mg/dL)   Comment 1 Notify RN     Comment 2 Documented in Chart    BASIC METABOLIC PANEL      Component Value Range   Sodium 142  135 - 145 (mEq/L)   Potassium 3.3 (*) 3.5 - 5.1 (mEq/L)   Chloride 97  96 - 112 (mEq/L)   CO2 35 (*) 19 - 32 (mEq/L)   Glucose, Bld 108 (*) 70 - 99 (mg/dL)   BUN 24 (*) 6 - 23 (mg/dL)   Creatinine, Ser 1.32 (*) 0.50 - 1.35 (mg/dL)   Calcium 9.1  8.4 - 44.0 (mg/dL)   GFR calc non Af Amer 37 (*) >90 (mL/min)   GFR calc Af Amer 43 (*) >90 (mL/min)  GLUCOSE, CAPILLARY      Component Value Range   Glucose-Capillary 152 (*) 70 - 99 (mg/dL)  GLUCOSE, CAPILLARY      Component Value Range   Glucose-Capillary 161 (*) 70 - 99 (mg/dL)  GLUCOSE, CAPILLARY      Component Value Range   Glucose-Capillary 181 (*) 70 - 99 (mg/dL)  GLUCOSE, CAPILLARY  Component Value Range   Glucose-Capillary 121 (*) 70 - 99 (mg/dL)  GLUCOSE, CAPILLARY      Component Value Range   Glucose-Capillary 114 (*) 70 - 99 (mg/dL)  GLUCOSE, CAPILLARY      Component Value Range   Glucose-Capillary 140 (*) 70 - 99 (mg/dL)  CBC      Component Value Range   WBC 8.6  4.0 - 10.5 (K/uL)   RBC 4.61  4.22 - 5.81 (MIL/uL)   Hemoglobin 13.8  13.0 - 17.0 (g/dL)   HCT 16.1  09.6 - 04.5 (%)   MCV 93.7  78.0 - 100.0 (fL)   MCH 29.9  26.0 - 34.0 (pg)   MCHC 31.9  30.0 - 36.0 (g/dL)   RDW 40.9  81.1 - 91.4 (%)   Platelets 211  150 - 400 (K/uL)  DIFFERENTIAL      Component Value Range   Neutrophils Relative 57  43 - 77 (%)   Neutro Abs 4.9  1.7 - 7.7 (K/uL)   Lymphocytes Relative 30  12 - 46 (%)   Lymphs Abs 2.6  0.7 - 4.0 (K/uL)   Monocytes Relative 10  3 - 12 (%)   Monocytes Absolute 0.9  0.1 - 1.0 (K/uL)   Eosinophils Relative 3  0 - 5 (%)   Eosinophils Absolute 0.2  0.0 - 0.7 (K/uL)   Basophils Relative 0  0 - 1 (%)   Basophils Absolute 0.0  0.0 - 0.1 (K/uL)  BASIC METABOLIC PANEL      Component Value Range   Sodium 141  135 -  145 (mEq/L)   Potassium 3.0 (*) 3.5 - 5.1 (mEq/L)   Chloride 93 (*) 96 - 112 (mEq/L)   CO2 36 (*) 19 - 32 (mEq/L)   Glucose, Bld 99  70 - 99 (mg/dL)   BUN 25 (*) 6 - 23 (mg/dL)   Creatinine, Ser 7.82 (*) 0.50 - 1.35 (mg/dL)   Calcium 9.7  8.4 - 95.6 (mg/dL)   GFR calc non Af Amer 40 (*) >90 (mL/min)   GFR calc Af Amer 46 (*) >90 (mL/min)  PRO B NATRIURETIC PEPTIDE      Component Value Range   Pro B Natriuretic peptide (BNP) 2154.0 (*) 0 - 125 (pg/mL)  GLUCOSE, CAPILLARY      Component Value Range   Glucose-Capillary 191 (*) 70 - 99 (mg/dL)  GLUCOSE, CAPILLARY      Component Value Range   Glucose-Capillary 139 (*) 70 - 99 (mg/dL)  GLUCOSE, CAPILLARY      Component Value Range   Glucose-Capillary 174 (*) 70 - 99 (mg/dL)  GLUCOSE, CAPILLARY      Component Value Range   Glucose-Capillary 111 (*) 70 - 99 (mg/dL)  GLUCOSE, CAPILLARY      Component Value Range   Glucose-Capillary 125 (*) 70 - 99 (mg/dL)  GLUCOSE, CAPILLARY      Component Value Range   Glucose-Capillary 138 (*) 70 - 99 (mg/dL)  BASIC METABOLIC PANEL      Component Value Range   Sodium 138  135 - 145 (mEq/L)   Potassium 3.6  3.5 - 5.1 (mEq/L)   Chloride 94 (*) 96 - 112 (mEq/L)   CO2 32  19 - 32 (mEq/L)   Glucose, Bld 140 (*) 70 - 99 (mg/dL)   BUN 39 (*) 6 - 23 (mg/dL)   Creatinine, Ser 2.13 (*) 0.50 - 1.35 (mg/dL)   Calcium 9.2  8.4 - 08.6 (mg/dL)   GFR calc non Af Denyse Dago  34 (*) >90 (mL/min)   GFR calc Af Amer 40 (*) >90 (mL/min)  GLUCOSE, CAPILLARY      Component Value Range   Glucose-Capillary 206 (*) 70 - 99 (mg/dL)  GLUCOSE, CAPILLARY      Component Value Range   Glucose-Capillary 161 (*) 70 - 99 (mg/dL)   Comment 1 Documented in Chart     Comment 2 Notify RN    GLUCOSE, CAPILLARY      Component Value Range   Glucose-Capillary 143 (*) 70 - 99 (mg/dL)   Comment 1 Documented in Chart     Comment 2 Notify RN    GLUCOSE, CAPILLARY      Component Value Range   Glucose-Capillary 187 (*) 70 - 99 (mg/dL)    Comment 1 Documented in Chart     Comment 2 Notify RN       Signed: Jame Seelig NEVILL 09/03/2011, 7:48 AM

## 2011-09-03 NOTE — Progress Notes (Signed)
CARE MANAGEMENT NOTE 09/03/2011  Patient:  Nicholas [name], Nicholas [name]   Account Number:  [id number]  Date Initiated:  09/01/2011  Documentation initiated by:  Dmarco Baldus,TYMEEKA  Subjective/Objective Assessment:   71 yo male admitted with SIRS. PTA pt lived with spouse.     Action/Plan:   home when stable.   Anticipated DC Date:  09/05/2011   Anticipated DC Plan:  HOME W HOME HEALTH SERVICES      DC Planning Services  CM consult      [facility] Choice  HOME HEALTH   Choice offered to / List presented to:  C-1 Patient        HH arranged  HH-1 RN      [facility] agency  [facility]   Status of service:  Completed, signed off Medicare Important Message given?  YES (If response is "NO", the following Medicare IM given date fields will be blank) Date Medicare IM given:  08/28/2011 Date Additional Medicare IM given:    Discharge Disposition:  HOME W HOME HEALTH SERVICES  Per UR Regulation:  Reviewed for med. necessity/level of care/duration of stay  Comments:  09/01/11 [address] [phone] CM spoke with pt with spouse at bedside concerning d/c planning. Pt previously active with [facility]. Per pt's wife would like to resume care with agency for [facility] services for dressing changes for husband's foot ulcer. Cm to leave sticky note, asking MD to enter resumption of care orders. Pt and family agree with d/c plan. No other DME or Hh services requested.

## 2011-09-03 NOTE — Progress Notes (Signed)
Pharmacy Brief Note: Exacerbation of CHF this admission, LVEF 30% by Echo 08/29/2011.  Not on ACEI/ARB at present due to acute renal insufficiency (SCr 1.91, CrCl 73mL/min).  MD will determine if/when patient becomes a candidate for ACEI/ARB therapy.  Elie Goody, PharmD, BCPS Pager; [phone] 09/03/2011  7:12 AM

## 2011-09-04 LAB — CULTURE, BLOOD (ROUTINE X 2)
Culture  Setup Time: 201301240309
Culture: NO GROWTH

## 2011-09-09 ENCOUNTER — Encounter (HOSPITAL_BASED_OUTPATIENT_CLINIC_OR_DEPARTMENT_OTHER): Payer: Medicare Other | Attending: Internal Medicine

## 2011-09-09 DIAGNOSIS — L8990 Pressure ulcer of unspecified site, unspecified stage: Secondary | ICD-10-CM | POA: Insufficient documentation

## 2011-09-09 DIAGNOSIS — I739 Peripheral vascular disease, unspecified: Secondary | ICD-10-CM | POA: Insufficient documentation

## 2011-09-09 DIAGNOSIS — L89109 Pressure ulcer of unspecified part of back, unspecified stage: Secondary | ICD-10-CM | POA: Insufficient documentation

## 2011-09-09 DIAGNOSIS — E785 Hyperlipidemia, unspecified: Secondary | ICD-10-CM | POA: Insufficient documentation

## 2011-09-09 DIAGNOSIS — Z79899 Other long term (current) drug therapy: Secondary | ICD-10-CM | POA: Insufficient documentation

## 2011-09-09 DIAGNOSIS — I509 Heart failure, unspecified: Secondary | ICD-10-CM | POA: Insufficient documentation

## 2011-09-09 DIAGNOSIS — Z8673 Personal history of transient ischemic attack (TIA), and cerebral infarction without residual deficits: Secondary | ICD-10-CM | POA: Insufficient documentation

## 2011-09-09 DIAGNOSIS — L97509 Non-pressure chronic ulcer of other part of unspecified foot with unspecified severity: Secondary | ICD-10-CM | POA: Insufficient documentation

## 2011-09-09 DIAGNOSIS — E1169 Type 2 diabetes mellitus with other specified complication: Secondary | ICD-10-CM | POA: Insufficient documentation

## 2011-09-09 DIAGNOSIS — I10 Essential (primary) hypertension: Secondary | ICD-10-CM | POA: Insufficient documentation

## 2011-09-17 ENCOUNTER — Encounter (INDEPENDENT_AMBULATORY_CARE_PROVIDER_SITE_OTHER): Payer: Medicare Other | Admitting: *Deleted

## 2011-09-17 DIAGNOSIS — Z48812 Encounter for surgical aftercare following surgery on the circulatory system: Secondary | ICD-10-CM

## 2011-09-17 DIAGNOSIS — I739 Peripheral vascular disease, unspecified: Secondary | ICD-10-CM

## 2011-09-18 NOTE — Procedures (Unsigned)
LOWER EXTREMITY ARTERIAL DUPLEX  INDICATION:  Followup left lower extremity arterial disease.  HISTORY: Diabetes:  Yes Cardiac:  MI Hypertension:  Yes Smoking:  Previous Previous Surgery:  Left popliteal and anterior tibial artery angioplasty on 10/02/2010, left ATA redo angioplasty on 12/11/2010, left popliteal atherectomy and angioplasty 05/07/2011  SINGLE LEVEL ARTERIAL EXAM                         RIGHT                LEFT Brachial:               152                  155 Anterior tibial:        97                   74 Posterior tibial:       91                   85 Peroneal: Ankle/Brachial Index:   0.63                 0.55  LOWER EXTREMITY ARTERIAL DUPLEX EXAM  DUPLEX:  Patent to left lower extremity arterial system with elevated velocities noted at/near the tibioperoneal trunk of 578 cm/sec  Patient body habitus and edema make visualization difficult.  IMPRESSION: 1. Stable right ankle brachial indices. 2. Slight decline in the left ankle brachial indices. 3. Elevated velocity noted at or near the tibioperoneal trunk of 578     cm/sec. 4. The patient has scheduled an appointment to see Dr. Myra Gianotti at his     first available, which is 10/21/2011.  ___________________________________________ V. Charlena Cross, MD  EM/MEDQ  D:  09/18/2011  T:  09/18/2011  Job:  [id number]

## 2011-09-21 IMAGING — CR DG CHEST 2V
1 series · 1 of 1 positions shown · non-contrast
Comparison: Chest radiograph performed 04/20/2010

CLINICAL DATA: Shortness of breath and generalized weakness; severe
leg swelling.

CHEST - 2 VIEW

[w chest lat *]
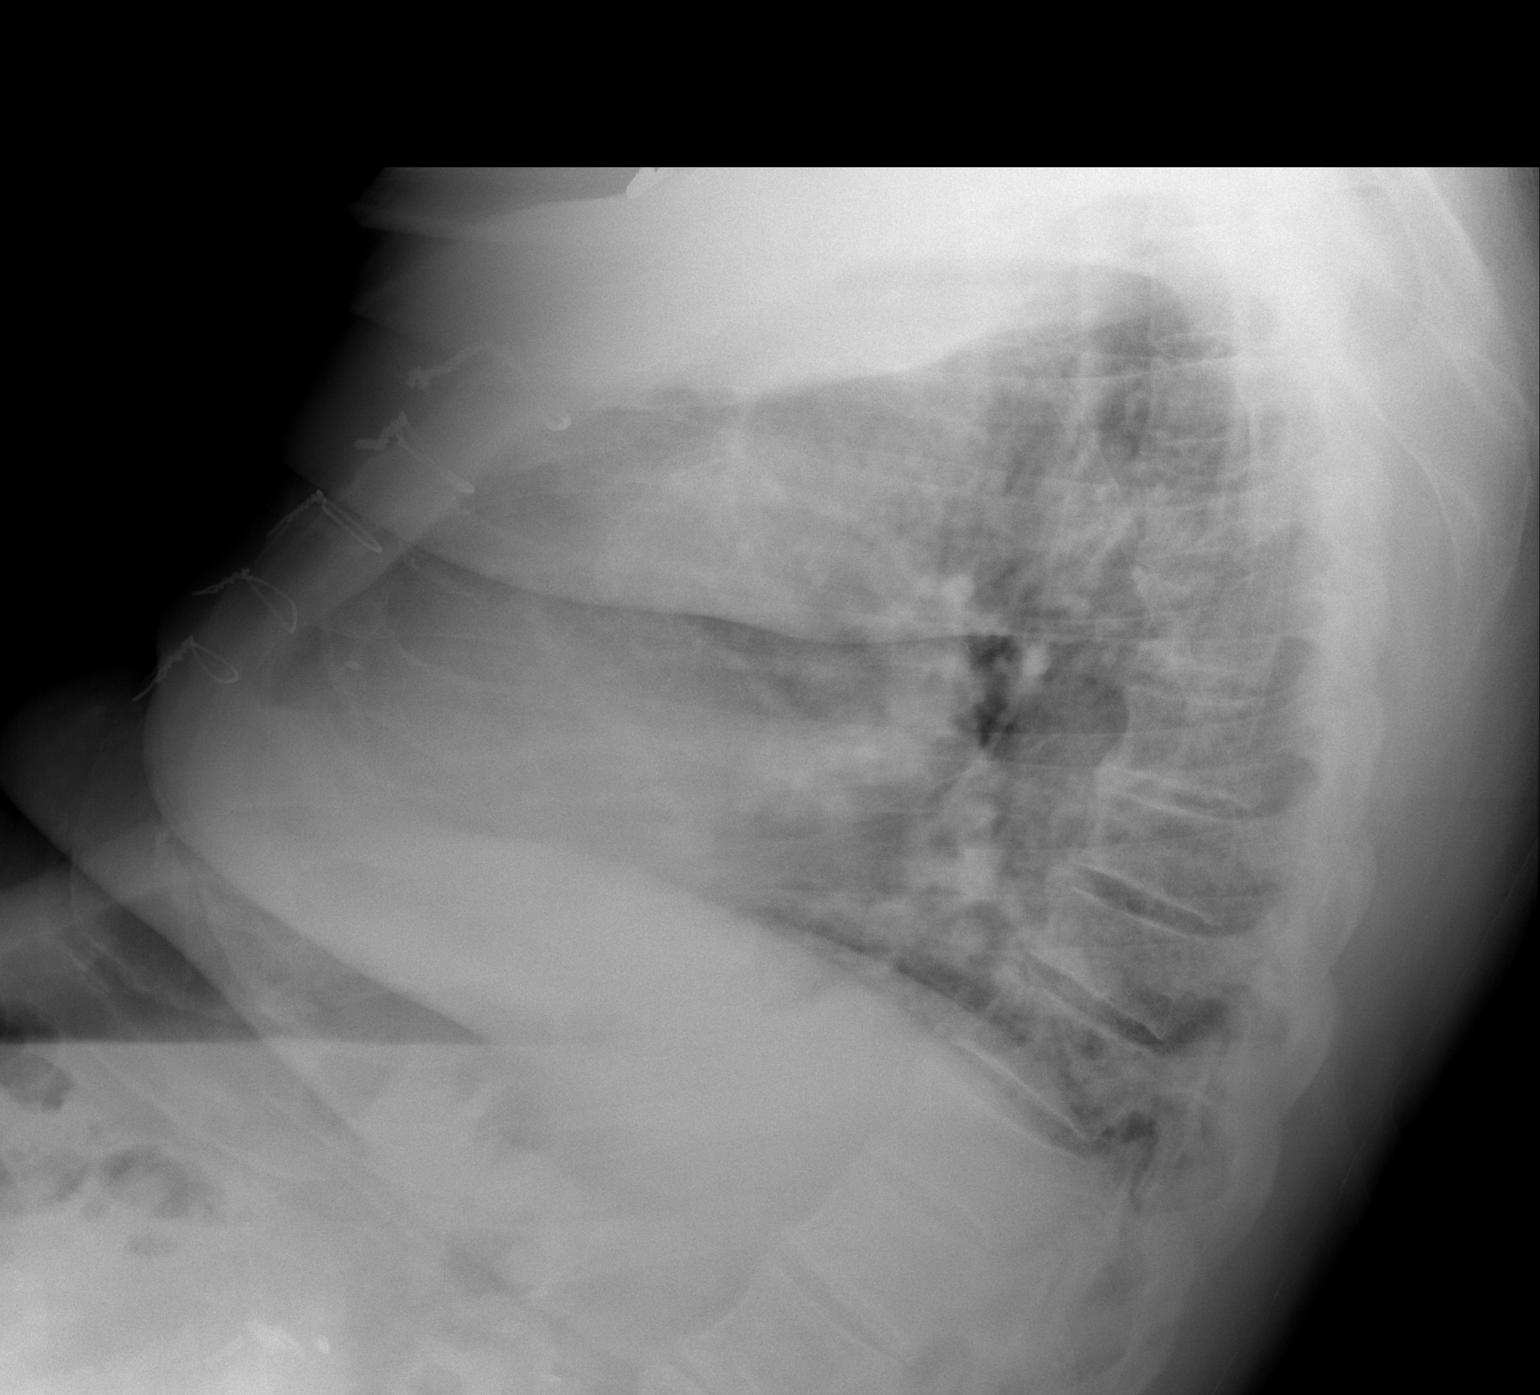

[1 of 1 positions shown; findings below may reference images not displayed]

FINDINGS: The lungs are well-aerated.  Mild vascular congestion is
noted, improved from the prior study.  Mild bibasilar atelectasis
is seen.  There is no evidence of focal opacification, pleural
effusion or pneumothorax.

The heart is mildly enlarged, though less prominent than on the
prior study; the patient is status post median sternotomy, with
evidence of prior CABG.  No acute osseous abnormalities are seen.
Mild degenerative change is noted along the thoracic spine.  Clips
are noted within the right upper quadrant, reflecting prior
cholecystectomy.
IMPRESSION: 1.  Mild vascular congestion and mild cardiomegaly, less prominent
than on the prior study.  No significant edema seen.
2.  Mild bibasilar atelectasis noted.

## 2011-10-04 ENCOUNTER — Observation Stay (HOSPITAL_COMMUNITY): Payer: Medicare Other

## 2011-10-04 ENCOUNTER — Emergency Department (HOSPITAL_COMMUNITY): Payer: Medicare Other

## 2011-10-04 ENCOUNTER — Inpatient Hospital Stay (HOSPITAL_COMMUNITY)
Admission: EM | Admit: 2011-10-04 | Discharge: 2011-10-07 | DRG: 602 | Disposition: A | Discharge status: Home Health | Payer: Medicare Other | Attending: Internal Medicine | Admitting: Internal Medicine

## 2011-10-04 DIAGNOSIS — L8994 Pressure ulcer of unspecified site, stage 4: Secondary | ICD-10-CM | POA: Diagnosis present

## 2011-10-04 DIAGNOSIS — I739 Peripheral vascular disease, unspecified: Secondary | ICD-10-CM

## 2011-10-04 DIAGNOSIS — Z9119 Patient's noncompliance with other medical treatment and regimen: Secondary | ICD-10-CM

## 2011-10-04 DIAGNOSIS — Z91199 Patient's noncompliance with other medical treatment and regimen due to unspecified reason: Secondary | ICD-10-CM

## 2011-10-04 DIAGNOSIS — F3289 Other specified depressive episodes: Secondary | ICD-10-CM | POA: Diagnosis present

## 2011-10-04 DIAGNOSIS — L97509 Non-pressure chronic ulcer of other part of unspecified foot with unspecified severity: Secondary | ICD-10-CM

## 2011-10-04 DIAGNOSIS — L03119 Cellulitis of unspecified part of limb: Secondary | ICD-10-CM

## 2011-10-04 DIAGNOSIS — E1159 Type 2 diabetes mellitus with other circulatory complications: Secondary | ICD-10-CM | POA: Diagnosis present

## 2011-10-04 DIAGNOSIS — E11621 Type 2 diabetes mellitus with foot ulcer: Secondary | ICD-10-CM | POA: Diagnosis present

## 2011-10-04 DIAGNOSIS — E785 Hyperlipidemia, unspecified: Secondary | ICD-10-CM

## 2011-10-04 DIAGNOSIS — Z6839 Body mass index (BMI) 39.0-39.9, adult: Secondary | ICD-10-CM

## 2011-10-04 DIAGNOSIS — L03116 Cellulitis of left lower limb: Secondary | ICD-10-CM | POA: Diagnosis present

## 2011-10-04 DIAGNOSIS — Z8673 Personal history of transient ischemic attack (TIA), and cerebral infarction without residual deficits: Secondary | ICD-10-CM

## 2011-10-04 DIAGNOSIS — L02619 Cutaneous abscess of unspecified foot: Principal | ICD-10-CM | POA: Diagnosis present

## 2011-10-04 DIAGNOSIS — F329 Major depressive disorder, single episode, unspecified: Secondary | ICD-10-CM | POA: Diagnosis present

## 2011-10-04 DIAGNOSIS — I119 Hypertensive heart disease without heart failure: Secondary | ICD-10-CM

## 2011-10-04 DIAGNOSIS — L89109 Pressure ulcer of unspecified part of back, unspecified stage: Secondary | ICD-10-CM | POA: Diagnosis present

## 2011-10-04 DIAGNOSIS — I2510 Atherosclerotic heart disease of native coronary artery without angina pectoris: Secondary | ICD-10-CM

## 2011-10-04 DIAGNOSIS — N179 Acute kidney failure, unspecified: Secondary | ICD-10-CM

## 2011-10-04 DIAGNOSIS — L98499 Non-pressure chronic ulcer of skin of other sites with unspecified severity: Secondary | ICD-10-CM | POA: Diagnosis present

## 2011-10-04 DIAGNOSIS — E119 Type 2 diabetes mellitus without complications: Secondary | ICD-10-CM

## 2011-10-04 DIAGNOSIS — E1169 Type 2 diabetes mellitus with other specified complication: Secondary | ICD-10-CM | POA: Diagnosis present

## 2011-10-04 DIAGNOSIS — R4182 Altered mental status, unspecified: Secondary | ICD-10-CM | POA: Diagnosis present

## 2011-10-04 DIAGNOSIS — R509 Fever, unspecified: Secondary | ICD-10-CM

## 2011-10-04 DIAGNOSIS — I509 Heart failure, unspecified: Secondary | ICD-10-CM | POA: Diagnosis present

## 2011-10-04 DIAGNOSIS — E1140 Type 2 diabetes mellitus with diabetic neuropathy, unspecified: Secondary | ICD-10-CM

## 2011-10-04 DIAGNOSIS — D72829 Elevated white blood cell count, unspecified: Secondary | ICD-10-CM

## 2011-10-04 DIAGNOSIS — Z66 Do not resuscitate: Secondary | ICD-10-CM | POA: Diagnosis present

## 2011-10-04 DIAGNOSIS — F419 Anxiety disorder, unspecified: Secondary | ICD-10-CM

## 2011-10-04 DIAGNOSIS — I129 Hypertensive chronic kidney disease with stage 1 through stage 4 chronic kidney disease, or unspecified chronic kidney disease: Secondary | ICD-10-CM | POA: Diagnosis present

## 2011-10-04 DIAGNOSIS — N189 Chronic kidney disease, unspecified: Secondary | ICD-10-CM | POA: Diagnosis present

## 2011-10-04 DIAGNOSIS — N400 Enlarged prostate without lower urinary tract symptoms: Secondary | ICD-10-CM

## 2011-10-04 LAB — TSH: TSH: 0.866 u[IU]/mL (ref 0.350–4.500)

## 2011-10-04 LAB — COMPREHENSIVE METABOLIC PANEL
AST: 8 U/L (ref 0–37)
Albumin: 3 g/dL — ABNORMAL LOW (ref 3.5–5.2)
Calcium: 8.8 mg/dL (ref 8.4–10.5)
Creatinine, Ser: 1.3 mg/dL (ref 0.50–1.35)
GFR calc non Af Amer: 54 mL/min — ABNORMAL LOW (ref >90)
Total Protein: 6.7 g/dL (ref 6.0–8.3)

## 2011-10-04 LAB — CBC
HCT: 44.3 % (ref 39.0–52.0)
Hemoglobin: 14.2 g/dL (ref 13.0–17.0)
MCH: 29.4 pg (ref 26.0–34.0)
MCHC: 32.1 g/dL (ref 30.0–36.0)
MCHC: 31.7 g/dL (ref 30.0–36.0)
MCV: 93.9 fL (ref 78.0–100.0)
MCV: 92.6 fL (ref 78.0–100.0)
Platelets: 224 10*3/uL (ref 150–400)
Platelets: 195 10*3/uL (ref 150–400)
RBC: 4.72 MIL/uL (ref 4.22–5.81)
RDW: 15.4 % (ref 11.5–15.5)
WBC: 20 10*3/uL — ABNORMAL HIGH (ref 4.0–10.5)

## 2011-10-04 LAB — DIFFERENTIAL
Basophils Relative: 0 % (ref 0–1)
Eosinophils Absolute: 0.2 10*3/uL (ref 0.0–0.7)
Eosinophils Absolute: 0 10*3/uL (ref 0.0–0.7)
Lymphocytes Relative: 6 % — ABNORMAL LOW (ref 12–46)
Lymphs Abs: 1.3 10*3/uL (ref 0.7–4.0)
Neutrophils Relative %: 85 % — ABNORMAL HIGH (ref 43–77)
Neutrophils Relative %: 89 % — ABNORMAL HIGH (ref 43–77)

## 2011-10-04 LAB — URINALYSIS, ROUTINE W REFLEX MICROSCOPIC
Bilirubin Urine: NEGATIVE
Hgb urine dipstick: NEGATIVE
Ketones, ur: NEGATIVE mg/dL
Nitrite: NEGATIVE
Urobilinogen, UA: 0.2 mg/dL (ref 0.0–1.0)
pH: 5.5 (ref 5.0–8.0)

## 2011-10-04 LAB — PROCALCITONIN: Procalcitonin: 0.15 ng/mL

## 2011-10-04 LAB — PHOSPHORUS: Phosphorus: 1.7 mg/dL — ABNORMAL LOW (ref 2.3–4.6)

## 2011-10-04 LAB — BASIC METABOLIC PANEL
CO2: 29 mEq/L (ref 19–32)
Chloride: 99 mEq/L (ref 96–112)
GFR calc Af Amer: 62 mL/min — ABNORMAL LOW (ref >90)
Potassium: 3.9 mEq/L (ref 3.5–5.1)
Sodium: 139 mEq/L (ref 135–145)

## 2011-10-04 LAB — APTT: aPTT: 37 seconds (ref 24–37)

## 2011-10-04 LAB — LACTIC ACID, PLASMA: Lactic Acid, Venous: 2.1 mmol/L (ref 0.5–2.2)

## 2011-10-04 MED ORDER — DIVALPROEX SODIUM ER 250 MG PO TB24
250.0000 mg | ORAL_TABLET | Freq: Every day | ORAL | Status: DC
  Administered 2011-10-04 – 2011-10-06 (×3): 250 mg via ORAL
  Filled 2011-10-04 (×4): qty 1

## 2011-10-04 MED ORDER — INSULIN ASPART 100 UNIT/ML ~~LOC~~ SOLN: 0.0000 [IU] | Freq: Every day | SUBCUTANEOUS | Status: DC

## 2011-10-04 MED ORDER — ASPIRIN 81 MG PO CHEW
81.0000 mg | CHEWABLE_TABLET | Freq: Every day | ORAL | Status: DC
  Administered 2011-10-05 – 2011-10-06 (×2): 81 mg via ORAL
  Filled 2011-10-04 (×3): qty 1

## 2011-10-04 MED ORDER — INSULIN ASPART 100 UNIT/ML ~~LOC~~ SOLN
0.0000 [IU] | Freq: Three times a day (TID) | SUBCUTANEOUS | Status: DC
  Administered 2011-10-06 (×2): 2 [IU] via SUBCUTANEOUS
  Administered 2011-10-06: 3 [IU] via SUBCUTANEOUS
  Administered 2011-10-07: 2 [IU] via SUBCUTANEOUS
  Filled 2011-10-04: qty 3

## 2011-10-04 MED ORDER — PANTOPRAZOLE SODIUM 40 MG PO TBEC
40.0000 mg | DELAYED_RELEASE_TABLET | Freq: Every day | ORAL | Status: DC
  Administered 2011-10-05 – 2011-10-06 (×2): 40 mg via ORAL
  Filled 2011-10-04 (×3): qty 1

## 2011-10-04 MED ORDER — ATENOLOL 50 MG PO TABS
50.0000 mg | ORAL_TABLET | Freq: Two times a day (BID) | ORAL | Status: DC
  Administered 2011-10-04 – 2011-10-06 (×5): 50 mg via ORAL
  Filled 2011-10-04 (×7): qty 1

## 2011-10-04 MED ORDER — METOLAZONE 5 MG PO TABS
5.0000 mg | ORAL_TABLET | ORAL | Status: DC
  Filled 2011-10-04: qty 1

## 2011-10-04 MED ORDER — VANCOMYCIN HCL IN DEXTROSE 1-5 GM/200ML-% IV SOLN
1000.0000 mg | Freq: Once | INTRAVENOUS | Status: AC
  Administered 2011-10-04: 1000 mg via INTRAVENOUS
  Filled 2011-10-04: qty 200

## 2011-10-04 MED ORDER — VANCOMYCIN HCL 1000 MG IV SOLR
1500.0000 mg | INTRAVENOUS | Status: DC
  Administered 2011-10-05 – 2011-10-06 (×2): 1500 mg via INTRAVENOUS
  Filled 2011-10-04 (×3): qty 1500

## 2011-10-04 MED ORDER — FUROSEMIDE 80 MG PO TABS
80.0000 mg | ORAL_TABLET | Freq: Two times a day (BID) | ORAL | Status: DC
  Administered 2011-10-04 – 2011-10-07 (×6): 80 mg via ORAL
  Filled 2011-10-04 (×7): qty 1

## 2011-10-04 MED ORDER — SODIUM CHLORIDE 0.9 % IV SOLN: INTRAVENOUS | Status: DC

## 2011-10-04 MED ORDER — ALPRAZOLAM 1 MG PO TABS: 1.0000 mg | ORAL_TABLET | Freq: Three times a day (TID) | ORAL | Status: DC

## 2011-10-04 MED ORDER — POTASSIUM CHLORIDE CRYS ER 20 MEQ PO TBCR
40.0000 meq | EXTENDED_RELEASE_TABLET | Freq: Two times a day (BID) | ORAL | Status: DC
  Administered 2011-10-04 – 2011-10-06 (×5): 40 meq via ORAL
  Filled 2011-10-04 (×7): qty 2

## 2011-10-04 MED ORDER — SIMVASTATIN 20 MG PO TABS
20.0000 mg | ORAL_TABLET | Freq: Every day | ORAL | Status: DC
  Administered 2011-10-05 – 2011-10-06 (×2): 20 mg via ORAL
  Filled 2011-10-04 (×3): qty 1

## 2011-10-04 MED ORDER — HYDROCODONE-ACETAMINOPHEN 5-500 MG PO TABS: 1.5000 | ORAL_TABLET | ORAL | Status: DC | PRN

## 2011-10-04 MED ORDER — TIMOLOL MALEATE 0.5 % OP SOLN
1.0000 [drp] | Freq: Two times a day (BID) | OPHTHALMIC | Status: DC
  Administered 2011-10-04 – 2011-10-06 (×3): 1 [drp] via OPHTHALMIC
  Filled 2011-10-04: qty 5

## 2011-10-04 MED ORDER — GLIPIZIDE 5 MG PO TABS
5.0000 mg | ORAL_TABLET | Freq: Two times a day (BID) | ORAL | Status: DC
  Administered 2011-10-05 – 2011-10-07 (×5): 5 mg via ORAL
  Filled 2011-10-04 (×6): qty 1

## 2011-10-04 MED ORDER — ENOXAPARIN SODIUM 40 MG/0.4ML ~~LOC~~ SOLN
40.0000 mg | SUBCUTANEOUS | Status: DC
  Administered 2011-10-04 – 2011-10-06 (×3): 40 mg via SUBCUTANEOUS
  Filled 2011-10-04 (×4): qty 0.4

## 2011-10-04 MED ORDER — PIPERACILLIN-TAZOBACTAM 3.375 G IVPB
3.3750 g | Freq: Three times a day (TID) | INTRAVENOUS | Status: DC
  Administered 2011-10-04 – 2011-10-07 (×8): 3.375 g via INTRAVENOUS
  Filled 2011-10-04 (×9): qty 50

## 2011-10-04 MED ORDER — PIPERACILLIN-TAZOBACTAM 3.375 G IVPB 30 MIN
3.3750 g | Freq: Once | INTRAVENOUS | Status: AC
  Administered 2011-10-04: 3.375 g via INTRAVENOUS
  Filled 2011-10-04: qty 50

## 2011-10-04 MED ORDER — ALLOPURINOL 300 MG PO TABS
300.0000 mg | ORAL_TABLET | Freq: Every day | ORAL | Status: DC
  Administered 2011-10-05 – 2011-10-06 (×2): 300 mg via ORAL
  Filled 2011-10-04 (×3): qty 1

## 2011-10-04 MED ORDER — SODIUM CHLORIDE 0.9 % IV BOLUS (SEPSIS)
1000.0000 mL | Freq: Once | INTRAVENOUS | Status: AC
  Administered 2011-10-04: 1000 mL via INTRAVENOUS

## 2011-10-04 MED ORDER — ALPRAZOLAM 1 MG PO TABS
2.0000 mg | ORAL_TABLET | Freq: Every day | ORAL | Status: DC
  Administered 2011-10-04: 2 mg via ORAL
  Filled 2011-10-04: qty 2

## 2011-10-04 MED ORDER — SODIUM CHLORIDE 0.9 % IV SOLN
INTRAVENOUS | Status: DC
  Administered 2011-10-04: 20:00:00 via INTRAVENOUS

## 2011-10-04 MED ORDER — BRIMONIDINE TARTRATE-TIMOLOL 0.2-0.5 % OP SOLN: 1.0000 [drp] | Freq: Two times a day (BID) | OPHTHALMIC | Status: DC

## 2011-10-04 MED ORDER — VANCOMYCIN HCL 1000 MG IV SOLR
2000.0000 mg | Freq: Once | INTRAVENOUS | Status: DC
  Filled 2011-10-04: qty 2000

## 2011-10-04 MED ORDER — TIMOLOL HEMIHYDRATE 0.5 % OP SOLN: 1.0000 [drp] | Freq: Two times a day (BID) | OPHTHALMIC | Status: DC

## 2011-10-04 MED ORDER — HYDROCODONE-ACETAMINOPHEN 5-325 MG PO TABS
1.5000 | ORAL_TABLET | ORAL | Status: DC | PRN
  Administered 2011-10-05: 1.5 via ORAL
  Filled 2011-10-04 (×2): qty 2

## 2011-10-04 MED ORDER — CLOPIDOGREL BISULFATE 75 MG PO TABS
75.0000 mg | ORAL_TABLET | Freq: Every day | ORAL | Status: DC
  Administered 2011-10-05 – 2011-10-07 (×3): 75 mg via ORAL
  Filled 2011-10-04 (×3): qty 1

## 2011-10-04 MED ORDER — ALPRAZOLAM 1 MG PO TABS
1.0000 mg | ORAL_TABLET | ORAL | Status: DC
  Administered 2011-10-05: 1 mg via ORAL
  Filled 2011-10-04: qty 1

## 2011-10-04 MED ORDER — OLOPATADINE HCL 0.1 % OP SOLN
1.0000 [drp] | Freq: Two times a day (BID) | OPHTHALMIC | Status: DC
  Administered 2011-10-04 – 2011-10-06 (×5): 1 [drp] via OPHTHALMIC
  Filled 2011-10-04: qty 5

## 2011-10-04 MED ORDER — LATANOPROST 0.005 % OP SOLN
1.0000 [drp] | Freq: Every day | OPHTHALMIC | Status: DC
  Administered 2011-10-04 – 2011-10-06 (×3): 1 [drp] via OPHTHALMIC
  Filled 2011-10-04: qty 2.5

## 2011-10-04 MED ORDER — TEMAZEPAM 15 MG PO CAPS
30.0000 mg | ORAL_CAPSULE | Freq: Every day | ORAL | Status: DC
  Administered 2011-10-04: 30 mg via ORAL
  Filled 2011-10-04: qty 2

## 2011-10-04 MED ORDER — BRIMONIDINE TARTRATE 0.2 % OP SOLN
1.0000 [drp] | Freq: Two times a day (BID) | OPHTHALMIC | Status: DC
  Administered 2011-10-04 – 2011-10-06 (×5): 1 [drp] via OPHTHALMIC
  Filled 2011-10-04: qty 5

## 2011-10-04 NOTE — ED Notes (Signed)
VOZ:[id number]<QI> Expected date:10/04/11<BR> Expected time: 1:45 PM<BR> Means of arrival:Ambulance<BR> Comments:<BR> EMS 61 GC - infection to legs/febrile

## 2011-10-04 NOTE — ED Provider Notes (Signed)
History     CSN: [id number]  Arrival date & time 10/04/11  1401   First MD Initiated Contact with Patient 10/04/11 1412      No chief complaint on file.  Fever, transient AMS (Consider location/radiation/quality/duration/timing/severity/associated sxs/prior treatment) HPI This 71 year old male who was recently hospitalized about a month ago with cellulitis to his left leg with a left foot ulcer, he had altered mental status at that time and developed flash pulmonary edema with IV fluid boluses, he ended up being intubated, he was discharged home and finished his antibiotics a few weeks ago now. At baseline he walks minimally just enough to transfer from bed to couch and has been that way for 5 years. He is a DO NOT RESUSCITATE and DO NOT INTUBATE patient. He does not want to ever be on a ventilator again. Over the last 2 days he is developed redness warmth and to his left foot and lower leg again just like his prior cellulitis. His left foot ulcer has been cared for at the wound clinic. Today he developed chills, a few episodes of nonbloody vomiting, and transient confusion at home prior to arrival which is now resolved. He is now oriented to person place and time. He denies any pain in his legs due to his neuropathy. He has no chest pain cough shortness breath abdominal pain or new focal neurologic symptoms. Past Medical History  Diagnosis Date  . CHF (congestive heart failure)   . Hypertension   . Diabetes mellitus   . CVA (cerebral infarction)   . Epilepsy   . Hx of CABG   . Peripheral vascular disease   . Ulcer     left foot  . Cellulitis   . Chronic kidney disease   . Dyslipidemia   . Chronic venous insufficiency   . Sleep apnea   . Morbid obesity   . Diverticulosis   . Benign prostatic hypertrophy   . Glaucoma   . Staphylococcus aureus infection   . Gout   . Depression   . Insomnia   . Reflux   . Coronary artery disease   . CAD (coronary artery disease), native coronary  artery     Past Surgical History  Procedure Date  . Coronary artery bypass graft     1992, redo in 1983  . Appendectomy   . Cholecystectomy, laparoscopic   . Lumbar laminectomy   . Hernia repair   . Mastoid debridement   . Knee and ankle surgery     Family History  Problem Relation Age of Onset  . Hypertension Other   . Cancer Other     History  Substance Use Topics  . Smoking status: Former Smoker    Types: Cigarettes    Quit date: 07/29/1991  . Smokeless tobacco: Never Used  . Alcohol Use: No     occasionallly      Review of Systems  Constitutional: Positive for fever.       10 Systems reviewed and are negative for acute change except as noted in the HPI.  HENT: Negative for congestion.   Eyes: Negative for discharge and redness.  Respiratory: Negative for cough and shortness of breath.   Cardiovascular: Negative for chest pain.  Gastrointestinal: Negative for vomiting and abdominal pain.  Musculoskeletal: Negative for back pain.  Skin: Positive for rash.  Neurological: Negative for syncope, numbness and headaches.  Psychiatric/Behavioral: Positive for confusion.       No behavior change.    Allergies  Review of  patient's allergies indicates no known allergies.  Home Medications   No current outpatient prescriptions on file.  BP 144/70  Pulse 60  Temp(Src) 98.2 F (36.8 C) (Oral)  Resp 18  Ht 5\' 9"  (1.753 m)  Wt 262 lb 2 oz (118.9 kg)  BMI 38.71 kg/m2  SpO2 97%  Physical Exam  Nursing note and vitals reviewed. Constitutional: He is oriented to person, place, and time.       Awake, alert, nontoxic appearance.  HENT:  Head: Atraumatic.  Eyes: Right eye exhibits no discharge. Left eye exhibits no discharge.  Neck: Neck supple.  Cardiovascular: Normal rate and regular rhythm.   No murmur heard. Pulmonary/Chest: Effort normal and breath sounds normal. No respiratory distress. He has no wheezes. He has no rales. He exhibits no tenderness.    Abdominal: Soft. There is no tenderness. There is no rebound.  Musculoskeletal: He exhibits edema. He exhibits no tenderness.       Baseline ROM, no obvious new focal weakness.  Both of his feet have numbness with capillary refill 2 seconds. His left plantar foot has a 2 cm diameter partial thickness ulcer with scant purulent drainage, however it is surrounded by erythema to the left foot ankle and lower leg with mild induration. He has moderate edema to both legs and feet. He moves all 4 extremities at baseline the  Neurological: He is alert and oriented to person, place, and time.       Mental status and motor strength appears baseline for patient and situation.  Skin: No rash noted.  Psychiatric: He has a normal mood and affect.    ED Course  Procedures (including critical care time) Patient is stable sitting up in the emergency without confusion and [facility] hospitalist have been paged for observation consideration. Labs Reviewed  BASIC METABOLIC PANEL - Abnormal; Notable for the following:    Glucose, Bld 128 (*)    BUN 25 (*)    GFR calc non Af Amer 54 (*)    GFR calc Af Amer 62 (*)    All other components within normal limits  CBC - Abnormal; Notable for the following:    WBC 20.0 (*)    All other components within normal limits  DIFFERENTIAL - Abnormal; Notable for the following:    Neutrophils Relative 85 (*)    Neutro Abs 16.9 (*)    Lymphocytes Relative 9 (*)    Monocytes Absolute 1.1 (*)    All other components within normal limits  CBC - Abnormal; Notable for the following:    WBC 20.9 (*)    RBC 4.19 (*)    Hemoglobin 12.3 (*)    HCT 38.8 (*)    All other components within normal limits  COMPREHENSIVE METABOLIC PANEL - Abnormal; Notable for the following:    Glucose, Bld 170 (*)    Albumin 3.0 (*)    GFR calc non Af Amer 54 (*)    GFR calc Af Amer 63 (*)    All other components within normal limits  MAGNESIUM - Abnormal; Notable for the following:    Magnesium  1.3 (*)    All other components within normal limits  PHOSPHORUS - Abnormal; Notable for the following:    Phosphorus 1.7 (*)    All other components within normal limits  DIFFERENTIAL - Abnormal; Notable for the following:    Neutrophils Relative 89 (*)    Neutro Abs 18.5 (*)    Lymphocytes Relative 6 (*)    All  other components within normal limits  HEMOGLOBIN A1C - Abnormal; Notable for the following:    Hemoglobin A1C 6.6 (*)    Mean Plasma Glucose 143 (*)    All other components within normal limits  COMPREHENSIVE METABOLIC PANEL - Abnormal; Notable for the following:    Potassium 3.2 (*)    Albumin 2.9 (*)    GFR calc non Af Amer 52 (*)    GFR calc Af Amer 61 (*)    All other components within normal limits  CBC - Abnormal; Notable for the following:    WBC 16.4 (*)    RBC 4.01 (*)    Hemoglobin 11.9 (*)    HCT 36.9 (*)    All other components within normal limits  GLUCOSE, CAPILLARY - Abnormal; Notable for the following:    Glucose-Capillary 135 (*)    All other components within normal limits  PRO B NATRIURETIC PEPTIDE - Abnormal; Notable for the following:    Pro B Natriuretic peptide (BNP) 805.6 (*)    All other components within normal limits  GLUCOSE, CAPILLARY - Abnormal; Notable for the following:    Glucose-Capillary 56 (*)    All other components within normal limits  GLUCOSE, CAPILLARY - Abnormal; Notable for the following:    Glucose-Capillary 110 (*)    All other components within normal limits  GLUCOSE, CAPILLARY - Abnormal; Notable for the following:    Glucose-Capillary 122 (*)    All other components within normal limits  GLUCOSE, CAPILLARY - Abnormal; Notable for the following:    Glucose-Capillary 155 (*)    All other components within normal limits  GLUCOSE, CAPILLARY - Abnormal; Notable for the following:    Glucose-Capillary 127 (*)    All other components within normal limits  LACTIC ACID, PLASMA  PROCALCITONIN  URINALYSIS, ROUTINE W  REFLEX MICROSCOPIC  URINE CULTURE  CULTURE, BLOOD (ROUTINE X 2)  CULTURE, BLOOD (ROUTINE X 2)  APTT  TSH  PROCALCITONIN  CULTURE, BLOOD (ROUTINE X 2)  CULTURE, BLOOD (ROUTINE X 2)  GLUCOSE, CAPILLARY  GLUCOSE, CAPILLARY  BASIC METABOLIC PANEL  CBC   Ct Head Wo Contrast  10/05/2011  *RADIOLOGY REPORT*  Clinical Data: Altered mental status.  Fevers  CT HEAD WITHOUT CONTRAST  Technique:  Contiguous axial images were obtained from the base of the skull through the vertex without contrast.  Comparison: 11/14/2010  Findings: There is diffuse patchy low density throughout the subcortical and periventricular white matter consistent with chronic small vessel ischemic change.  There is prominence of the sulci and ventricles consistent with brain atrophy.  There is no evidence for acute brain infarct, hemorrhage or mass.  The paranasal sinuses and mastoid air cells are clear.  The skull appears intact.  IMPRESSION: 1.   No acute intracranial abnormalities. 2.  Small vessel ischemic change and brain atrophy.  Original Report Authenticated By: Rosealee Albee, M.D.     1. Recurrent cellulitis of lower leg   2. Fever   3. Diabetes mellitus with neuropathy   4. Diabetic foot ulcer   5. Altered mental status       MDM  The patient appears reasonably stabilized for admission considering the current resources, flow, and capabilities available in the ED at this time, and I doubt any other [facility] requiring further screening and/or treatment in the ED prior to admission.        Hurman Horn, MD 10/06/11 [phone]

## 2011-10-04 NOTE — H&P (Addendum)
PCP:  Pearla Dubonnet, MD, MD   DOA:  10/04/2011  2:07 PM  Chief Complaint:  Left Lower extremity cellulitis  HPI: 71 year old male with history of DM, HTN, chronic diabetic foot ulcer with recurrent cellulitis who was recently hospitalized and subsequently discharged for left lower extremity cellulitis. As per patient he was discharged with Augmentin and it worked well while he was on antibiotic but in last 2 weeks his left lower extremity was getting worse in terms of erythema and tenderness.. Patient reported having fever at home as high as 63 F and as per patient's wife he appeared confused. While in ED, patient is alert and oriented but as per family he was "not himself" at home. Patient denied having abdominal pain, nausea or vomiting. NO complaints of chest pain, shortness of breath or palpitations. No blood in stool or urine. Patient will be admitted to hospitalist service  But to be followed by his PCP throughout the hospital stay.  Assessment/Plan  Principal Problem:   *ALTERED MENTAL STATUS - unclear etiology perhaps related to an infectious process, metabolic encephalopathy - follow up CT head  Active Problems:   Diabetic foot ulcer - we will start vancomycin per pharmacy consult - wound care consult - analgesia with Vicodin  Cellulitis of left lower extremity - vancomycin per pharmacy - zosyn per pharmacy - procalcitonin level   DM (diabetes mellitus) - CBG monitoring - sliding scale insulin - A1c   Leukocytosis - secondary to cellulitis - provide IV fluids - repeat CBC in am    Allergies: No Known Allergies  Prior to Admission medications   Medication Sig Start Date End Date Taking? Authorizing Provider  allopurinol (ZYLOPRIM) 300 MG tablet Take 300 mg by mouth daily.     Yes Historical Provider, MD  ALPRAZolam Prudy Feeler) 1 MG tablet Take 1-2 mg by mouth 3 (three) times daily. Takes 1 tablet in the morning and at 430 pm and 2 tablets at bedtime.   Yes  Historical Provider, MD  aspirin 81 MG chewable tablet Chew 1 tablet (81 mg total) by mouth daily. 09/03/11 09/02/12 Yes Pearla Dubonnet, MD  atenolol (TENORMIN) 50 MG tablet Take 50 mg by mouth 2 (two) times daily.    Yes Historical Provider, MD  brimonidine-timolol (COMBIGAN) 0.2-0.5 % ophthalmic solution Place 1 drop into both eyes every 12 (twelve) hours.     Yes Historical Provider, MD  clopidogrel (PLAVIX) 75 MG tablet Take 75 mg by mouth daily.     Yes Historical Provider, MD  divalproex (DEPAKOTE) 250 MG 24 hr tablet Take 250 mg by mouth daily.     Yes Historical Provider, MD  furosemide (LASIX) 80 MG tablet Take 80 mg by mouth 2 (two) times daily.     Yes Historical Provider, MD  glipiZIDE (GLUCOTROL) 5 MG tablet Take 5 mg by mouth 2 (two) times daily before a meal.     Yes Historical Provider, MD  HYDROcodone-acetaminophen (VICODIN ES) 7.5-750 MG per tablet Take 1 tablet by mouth every 6 (six) hours as needed. For pain   Yes Historical Provider, MD  insulin glargine (LANTUS) 100 UNIT/ML injection Inject 5-10 Units into the skin at bedtime as needed. To sustain blood glucose levels   Yes Historical Provider, MD  latanoprost (XALATAN) 0.005 % ophthalmic solution Place 1 drop into both eyes at bedtime.     Yes Historical Provider, MD  metolazone (ZAROXOLYN) 5 MG tablet Take 5 mg by mouth 2 (two) times a week. Takes on  Mondays and Fridays   Yes Historical Provider, MD  nitroGLYCERIN (NITROSTAT) 0.4 MG SL tablet Place 0.4 mg under the tongue every 5 (five) minutes as needed. For chest pain   Yes Historical Provider, MD  olopatadine (PATANOL) 0.1 % ophthalmic solution Place 1 drop into both eyes 2 (two) times daily.     Yes Historical Provider, MD  omeprazole (PRILOSEC) 20 MG capsule Take 20 mg by mouth 2 (two) times daily as needed. For heart burn   Yes Historical Provider, MD  potassium chloride SA (K-DUR,KLOR-CON) 20 MEQ tablet Take 2 tablets (40 mEq total) by mouth 2 (two) times daily.  09/03/11 09/02/12 Yes Pearla Dubonnet, MD  simvastatin (ZOCOR) 20 MG tablet Take 20 mg by mouth daily.    Yes Historical Provider, MD  temazepam (RESTORIL) 30 MG capsule Take 30 mg by mouth at bedtime.    Yes Historical Provider, MD  timolol (BETIMOL) 0.5 % ophthalmic solution Place 1 drop into the left eye 2 (two) times daily.     Yes Historical Provider, MD    Past Medical History  Diagnosis Date  . CHF (congestive heart failure)   . Hypertension   . Diabetes mellitus   . CVA (cerebral infarction)   . Epilepsy   . Hx of CABG   . Peripheral vascular disease   . Ulcer     left foot  . Cellulitis   . Chronic kidney disease   . Dyslipidemia   . Chronic venous insufficiency   . Sleep apnea   . Morbid obesity   . Diverticulosis   . Benign prostatic hypertrophy   . Glaucoma   . Staphylococcus aureus infection   . Gout   . Depression   . Insomnia   . Reflux   . Coronary artery disease   . CAD (coronary artery disease), native coronary artery     Past Surgical History  Procedure Date  . Coronary artery bypass graft     1992, redo in 1983  . Appendectomy   . Cholecystectomy, laparoscopic   . Lumbar laminectomy   . Hernia repair   . Mastoid debridement   . Knee and ankle surgery     Social History:  reports that he quit smoking about 20 years ago. His smoking use included Cigarettes. He has never used smokeless tobacco. He reports that he does not drink alcohol or use illicit drugs.  Family History  Problem Relation Age of Onset  . Hypertension Other   . Cancer Other     Review of Systems:  Constitutional: Denies fever, chills, diaphoresis, appetite change and fatigue.  HEENT: Denies photophobia, eye pain, redness, hearing loss, ear pain, congestion, sore throat, rhinorrhea, sneezing, mouth sores, trouble swallowing, neck pain, neck stiffness and tinnitus.   Respiratory: Denies SOB, DOE, cough, chest tightness,  and wheezing.   Cardiovascular: Denies chest pain,  palpitations and leg swelling.  Gastrointestinal: Denies nausea, vomiting, abdominal pain, diarrhea, constipation, blood in stool and abdominal distention.  Genitourinary: Denies dysuria, urgency, frequency, hematuria, flank pain and difficulty urinating.  Musculoskeletal: Denies myalgias, back pain, joint swelling, arthralgias and gait problem.  Skin: left lower extremity cellulitis Neurological: Denies dizziness, seizures, syncope, weakness, light-headedness, numbness and headaches.  Hematological: Denies adenopathy. Easy bruising, personal or family bleeding history  Psychiatric/Behavioral: Denies suicidal ideation, mood changes, confusion, nervousness, sleep disturbance and agitation   Physical Exam:  Filed Vitals:   10/04/11 1401 10/04/11 1441 10/04/11 1748  BP:  166/69 142/60  Pulse:  86 92  Temp:  101.9 F (38.8 C) 100.3 F (37.9 C)  TempSrc:  Rectal Oral  Resp:  18   SpO2: 100% 95% 100%    Constitutional: Vital signs reviewed.  Patient is a well-developed and well-nourished in no acute distress and cooperative with exam. Alert and oriented to name only.  Head: Normocephalic and atraumatic Ear: TM normal bilaterally Mouth: no erythema or exudates, MMM Eyes: PERRL, EOMI, conjunctivae normal, No scleral icterus.  Neck: Supple, Trachea midline normal ROM, No JVD, mass, thyromegaly, or carotid bruit present.  Cardiovascular: RRR, S1 normal, S2 normal, no MRG, pulses symmetric and intact bilaterally Pulmonary/Chest: CTAB, no wheezes, rales, or rhonchi Abdominal: Soft. Non-tender, non-distended, bowel sounds are normal, no masses, organomegaly, or guarding present.  GU: no CVA tenderness Musculoskeletal: No joint deformities, erythema, or stiffness, ROM full and no nontender Ext: left lower extremity cellulitis; left plantar foot ulcer about 2 cm in diameter with some purulent drainage Hematology: no cervical, inginal, or axillary adenopathy.  Neurological: A&O x3, Strenght is  normal and symmetric bilaterally, cranial nerve II-XII are grossly intact, no focal motor deficit, sensory intact to light touch bilaterally.  Skin: erythema and tenderness over left lower extremity more than right Psychiatric: Normal mood and affect. speech and behavior is normal. Judgment and thought content normal. Cognition and memory are normal.   Labs on Admission:  Results for orders placed during the hospital encounter of 10/04/11 (from the past 48 hour(s))  BASIC METABOLIC PANEL     Status: Abnormal   Collection Time   10/04/11  3:45 PM      Component Value Range Comment   Sodium 139  135 - 145 (mEq/L)    Potassium 3.9  3.5 - 5.1 (mEq/L)    Chloride 99  96 - 112 (mEq/L)    CO2 29  19 - 32 (mEq/L)    Glucose, Bld 128 (*) 70 - 99 (mg/dL)    BUN 25 (*) 6 - 23 (mg/dL)    Creatinine, Ser 1.61  0.50 - 1.35 (mg/dL)    Calcium 9.8  8.4 - 10.5 (mg/dL)    GFR calc non Af Amer 54 (*) >90 (mL/min)    GFR calc Af Amer 62 (*) >90 (mL/min)   CBC     Status: Abnormal   Collection Time   10/04/11  3:45 PM      Component Value Range Comment   WBC 20.0 (*) 4.0 - 10.5 (K/uL)    RBC 4.72  4.22 - 5.81 (MIL/uL)    Hemoglobin 14.2  13.0 - 17.0 (g/dL)    HCT 09.6  04.5 - 40.9 (%)    MCV 93.9  78.0 - 100.0 (fL)    MCH 30.1  26.0 - 34.0 (pg)    MCHC 32.1  30.0 - 36.0 (g/dL)    RDW 81.1  91.4 - 78.2 (%)    Platelets 224  150 - 400 (K/uL)   DIFFERENTIAL     Status: Abnormal   Collection Time   10/04/11  3:45 PM      Component Value Range Comment   Neutrophils Relative 85 (*) 43 - 77 (%)    Neutro Abs 16.9 (*) 1.7 - 7.7 (K/uL)    Lymphocytes Relative 9 (*) 12 - 46 (%)    Lymphs Abs 1.8  0.7 - 4.0 (K/uL)    Monocytes Relative 5  3 - 12 (%)    Monocytes Absolute 1.1 (*) 0.1 - 1.0 (K/uL)    Eosinophils Relative 1  0 -  5 (%)    Eosinophils Absolute 0.2  0.0 - 0.7 (K/uL)    Basophils Relative 0  0 - 1 (%)    Basophils Absolute 0.0  0.0 - 0.1 (K/uL)   LACTIC ACID, PLASMA     Status: Normal    Collection Time   10/04/11  3:45 PM      Component Value Range Comment   Lactic Acid, Venous 2.1  0.5 - 2.2 (mmol/L)   PROCALCITONIN     Status: Normal   Collection Time   10/04/11  3:45 PM      Component Value Range Comment   Procalcitonin 0.15     URINALYSIS, ROUTINE W REFLEX MICROSCOPIC     Status: Normal   Collection Time   10/04/11  4:00 PM      Component Value Range Comment   Color, Urine YELLOW  YELLOW     APPearance CLEAR  CLEAR     Specific Gravity, Urine 1.014  1.005 - 1.030     pH 5.5  5.0 - 8.0     Glucose, UA NEGATIVE  NEGATIVE (mg/dL)    Hgb urine dipstick NEGATIVE  NEGATIVE     Bilirubin Urine NEGATIVE  NEGATIVE     Ketones, ur NEGATIVE  NEGATIVE (mg/dL)    Protein, ur NEGATIVE  NEGATIVE (mg/dL)    Urobilinogen, UA 0.2  0.0 - 1.0 (mg/dL)    Nitrite NEGATIVE  NEGATIVE     Leukocytes, UA NEGATIVE  NEGATIVE  MICROSCOPIC NOT DONE ON URINES WITH NEGATIVE PROTEIN, BLOOD, LEUKOCYTES, NITRITE, OR GLUCOSE <1000 mg/dL.     Time Spent on Admission: Greater than 30 minutes  Merrick Feutz 10/04/2011, 6:26 PM

## 2011-10-04 NOTE — ED Notes (Signed)
Pt alert and oriented x4. Respirations even and unlabored, bilateral symmetrical rise and fall of chest. Skin warm and dry. In no acute distress. Denies needs.  Pt sitting at the end of the bed. Pt told that he is not to get up and stand. Pt and wife verbalize understading this.

## 2011-10-04 NOTE — ED Notes (Signed)
Report given to fonda, rn on floor

## 2011-10-04 NOTE — ED Notes (Signed)
Per ems pt is from home, his wife called ems because "he was not answering all her commands and questions." for ems pt was alert and oriented x4 and answered all questions correctly. Pt does have hx of diabetes, and left arch ulcer on his foot and a sore on his back that he has had for 5 years. Pt had just been in hospital for cellutilitis, back at home for last 3 weeks. Pt is hot to touch.

## 2011-10-04 NOTE — Progress Notes (Signed)
ED CM noted Admission RN's CM consult for preference for home health services if needed Notified Unit cm & attending MD

## 2011-10-04 NOTE — Progress Notes (Addendum)
ANTIBIOTIC CONSULT NOTE - INITIAL  Pharmacy Consult for Vancomycin/Zosyn Indication: Diabetic foot ulcer/cellulitis  No Known Allergies  Patient Measurements:    Vital Signs: Temp: 100.3 F (37.9 C) (03/01 1748) Temp src: Oral (03/01 1748) BP: 142/60 mmHg (03/01 1748) Pulse Rate: 92  (03/01 1748) Intake/Output from previous day:   Intake/Output from this shift:    Labs:  Basename 10/04/11 1545  WBC 20.0*  HGB 14.2  PLT 224  LABCREA --  CREATININE 1.31   The CrCl is unknown because both a height and weight (above a minimum accepted value) are required for this calculation. No results found for this basename: VANCOTROUGH:2,VANCOPEAK:2,VANCORANDOM:2,GENTTROUGH:2,GENTPEAK:2,GENTRANDOM:2,TOBRATROUGH:2,TOBRAPEAK:2,TOBRARND:2,AMIKACINPEAK:2,AMIKACINTROU:2,AMIKACIN:2, in the last 72 hours   Microbiology: No results found for this or any previous visit (from the past 720 hour(s)).  Medical History: Past Medical History  Diagnosis Date  . CHF (congestive heart failure)   . Hypertension   . Diabetes mellitus   . CVA (cerebral infarction)   . Epilepsy   . Hx of CABG   . Peripheral vascular disease   . Ulcer     left foot  . Cellulitis   . Chronic kidney disease   . Dyslipidemia   . Chronic venous insufficiency   . Sleep apnea   . Morbid obesity   . Diverticulosis   . Benign prostatic hypertrophy   . Glaucoma   . Staphylococcus aureus infection   . Gout   . Depression   . Insomnia   . Reflux   . Coronary artery disease   . CAD (coronary artery disease), native coronary artery     Medications:  Scheduled:    . allopurinol  300 mg Oral Daily  . ALPRAZolam  1 mg Oral Custom  . ALPRAZolam  2 mg Oral QHS  . aspirin  81 mg Oral Daily  . atenolol  50 mg Oral BID  . brimonidine  1 drop Both Eyes BID  . clopidogrel  75 mg Oral Q breakfast  . divalproex  250 mg Oral QHS  . enoxaparin  40 mg Subcutaneous Q24H  . furosemide  80 mg Oral BID  . glipiZIDE  5 mg Oral  BID AC  . insulin aspart  0-15 Units Subcutaneous TID WC  . insulin aspart  0-5 Units Subcutaneous QHS  . latanoprost  1 drop Both Eyes QHS  . metolazone  5 mg Oral 2 times weekly  . olopatadine  1 drop Both Eyes BID  . pantoprazole  40 mg Oral Q1200  . piperacillin-tazobactam  3.375 g Intravenous Once  . potassium chloride SA  40 mEq Oral BID  . simvastatin  20 mg Oral QPC supper  . sodium chloride  1,000 mL Intravenous Once  . temazepam  30 mg Oral QHS  . timolol  1 drop Left Eye BID  . timolol  1 drop Both Eyes BID  . vancomycin  1,500 mg Intravenous Q24H  . vancomycin  2,000 mg Intravenous Once  . vancomycin  1,000 mg Intravenous Once  . DISCONTD: ALPRAZolam  1-2 mg Oral TID  . DISCONTD: brimonidine-timolol  1 drop Both Eyes Q12H   Infusions:    . sodium chloride    . sodium chloride     PRN: DISCONTD: HYDROcodone-acetaminophen Assessment:  71 yo M with chronic diabetic foot ulcer and recurrent cellulitis, recently discharged from hospital for this same cellulitis on Augmentin with increasing erythremia and tenderness of the past two weeks and reported fever and AMS.  Will start vancomycin/zosyn per pharmacy  Weight =  120 kg  Normalized CrCl 54 ml/min (Scr 1.31)  Leukocytosis, WBC 20.0  TMax 101.9   Goal of Therapy:  Given recurrent nature, vancomycin trough 15-20  Plan:  1.) Vancomycin 2000 mg IV x 1, then start 1500 mg q24h 2.) Monitor temp and WBC 3.) Vancomycin troughs as needed 4.) Start zosyn 3.375 gm IV q8h  Mckennah Kretchmer, Loma Messing PharmD 7:08 PM 10/04/2011

## 2011-10-05 LAB — COMPREHENSIVE METABOLIC PANEL
ALT: 6 U/L (ref 0–53)
BUN: 21 mg/dL (ref 6–23)
Calcium: 8.9 mg/dL (ref 8.4–10.5)
GFR calc Af Amer: 61 mL/min — ABNORMAL LOW (ref >90)
Glucose, Bld: 89 mg/dL (ref 70–99)
Sodium: 138 mEq/L (ref 135–145)
Total Protein: 7 g/dL (ref 6.0–8.3)

## 2011-10-05 LAB — GLUCOSE, CAPILLARY
Glucose-Capillary: 81 mg/dL (ref 70–99)
Glucose-Capillary: 89 mg/dL (ref 70–99)
Glucose-Capillary: 56 mg/dL — ABNORMAL LOW (ref 70–99)

## 2011-10-05 LAB — CBC
HCT: 36.9 % — ABNORMAL LOW (ref 39.0–52.0)
Hemoglobin: 11.9 g/dL — ABNORMAL LOW (ref 13.0–17.0)
RBC: 4.01 MIL/uL — ABNORMAL LOW (ref 4.22–5.81)
WBC: 16.4 10*3/uL — ABNORMAL HIGH (ref 4.0–10.5)

## 2011-10-05 LAB — PROCALCITONIN: Procalcitonin: 0.36 ng/mL

## 2011-10-05 MED ORDER — ZOLPIDEM TARTRATE 5 MG PO TABS
5.0000 mg | ORAL_TABLET | Freq: Every day | ORAL | Status: DC
  Filled 2011-10-05: qty 1

## 2011-10-05 MED ORDER — ALPRAZOLAM 0.5 MG PO TABS
0.5000 mg | ORAL_TABLET | ORAL | Status: DC
  Administered 2011-10-06 – 2011-10-07 (×3): 0.5 mg via ORAL
  Filled 2011-10-05 (×3): qty 1

## 2011-10-05 MED ORDER — ALPRAZOLAM 1 MG PO TABS
1.0000 mg | ORAL_TABLET | Freq: Every day | ORAL | Status: DC
  Administered 2011-10-05 – 2011-10-06 (×2): 1 mg via ORAL
  Filled 2011-10-05 (×3): qty 1

## 2011-10-05 NOTE — Progress Notes (Signed)
Subjective: Patient somnolent but easily aroused, and history and physical reviewed, according to wife and increased confusion at home. This typically occurs according to the wife when he has an infection. Review of outpatient records suggest patient had increased somnolence, and was recommended to reduce his medications however the patient's wife didn't stop the Depakote. He still remains with a temperature, leukocytosis has improved. He currently is on broad-spectrum antibiotics for cellulitis of the left lower extremity  Objective: Vital signs in last 24 hours: Temp:  [98.7 F (37.1 C)-101.9 F (38.8 C)] 98.7 F (37.1 C) (03/02 0614) Pulse Rate:  [80-115] 80  (03/02 0614) Resp:  [16-20] 20  (03/02 0614) BP: (110-166)/(60-77) 110/63 mmHg (03/02 0614) SpO2:  [91 %-100 %] 91 % (03/02 0614) Weight:  [120.5 kg (265 lb 10.5 oz)] 120.5 kg (265 lb 10.5 oz) (03/02 1610) Weight change:  Last BM Date: 10/04/11  Intake/Output from previous day: 03/01 0701 - 03/02 0700 In: -  Out: 2100 [Urine:2100] Intake/Output this shift:    General appearance: Somnolence with slow mentation but answers appropriately, however drifts back off to sleep Resp: clear to auscultation bilaterally Cardio: regular rate and rhythm, S1, S2 normal, no murmur, click, rub or gallop Extremities: Left lower extremity redness erythema slight tenderness to palpation, no places in right lower extremity puffiness across the dorsum of the foot, externally rotated, according to wife is his chronic physician  Lab Results:  Results for orders placed during the hospital encounter of 10/04/11 (from the past 24 hour(s))  CULTURE, BLOOD (ROUTINE X 2)     Status: Normal (Preliminary result)   Collection Time   10/04/11  3:00 PM      Component Value Range   Specimen Description BLOOD IV SITE     Special Requests BOTTLES DRAWN AEROBIC AND ANAEROBIC 10CC     Culture  Setup Time [id number]     Culture       Value:        BLOOD  CULTURE RECEIVED NO GROWTH TO DATE CULTURE WILL BE HELD FOR 5 DAYS BEFORE ISSUING A FINAL NEGATIVE REPORT   Report Status PENDING    BASIC METABOLIC PANEL     Status: Abnormal   Collection Time   10/04/11  3:45 PM      Component Value Range   Sodium 139  135 - 145 (mEq/L)   Potassium 3.9  3.5 - 5.1 (mEq/L)   Chloride 99  96 - 112 (mEq/L)   CO2 29  19 - 32 (mEq/L)   Glucose, Bld 128 (*) 70 - 99 (mg/dL)   BUN 25 (*) 6 - 23 (mg/dL)   Creatinine, Ser 1.47  0.50 - 1.35 (mg/dL)   Calcium 9.8  8.4 - 82.9 (mg/dL)   GFR calc non Af Amer 54 (*) >90 (mL/min)   GFR calc Af Amer 62 (*) >90 (mL/min)  CBC     Status: Abnormal   Collection Time   10/04/11  3:45 PM      Component Value Range   WBC 20.0 (*) 4.0 - 10.5 (K/uL)   RBC 4.72  4.22 - 5.81 (MIL/uL)   Hemoglobin 14.2  13.0 - 17.0 (g/dL)   HCT 56.2  13.0 - 86.5 (%)   MCV 93.9  78.0 - 100.0 (fL)   MCH 30.1  26.0 - 34.0 (pg)   MCHC 32.1  30.0 - 36.0 (g/dL)   RDW 78.4  69.6 - 29.5 (%)   Platelets 224  150 - 400 (K/uL)  DIFFERENTIAL  Status: Abnormal   Collection Time   10/04/11  3:45 PM      Component Value Range   Neutrophils Relative 85 (*) 43 - 77 (%)   Neutro Abs 16.9 (*) 1.7 - 7.7 (K/uL)   Lymphocytes Relative 9 (*) 12 - 46 (%)   Lymphs Abs 1.8  0.7 - 4.0 (K/uL)   Monocytes Relative 5  3 - 12 (%)   Monocytes Absolute 1.1 (*) 0.1 - 1.0 (K/uL)   Eosinophils Relative 1  0 - 5 (%)   Eosinophils Absolute 0.2  0.0 - 0.7 (K/uL)   Basophils Relative 0  0 - 1 (%)   Basophils Absolute 0.0  0.0 - 0.1 (K/uL)  LACTIC ACID, PLASMA     Status: Normal   Collection Time   10/04/11  3:45 PM      Component Value Range   Lactic Acid, Venous 2.1  0.5 - 2.2 (mmol/L)  PROCALCITONIN     Status: Normal   Collection Time   10/04/11  3:45 PM      Component Value Range   Procalcitonin 0.15    CULTURE, BLOOD (ROUTINE X 2)     Status: Normal (Preliminary result)   Collection Time   10/04/11  3:45 PM      Component Value Range   Specimen Description BLOOD  BOTTLES DRAWN AEROBIC AND ANAEROBIC 3CC     Special Requests NONE     Culture  Setup Time [id number]     Culture       Value:        BLOOD CULTURE RECEIVED NO GROWTH TO DATE CULTURE WILL BE HELD FOR 5 DAYS BEFORE ISSUING A FINAL NEGATIVE REPORT   Report Status PENDING    URINALYSIS, ROUTINE W REFLEX MICROSCOPIC     Status: Normal   Collection Time   10/04/11  4:00 PM      Component Value Range   Color, Urine YELLOW  YELLOW    APPearance CLEAR  CLEAR    Specific Gravity, Urine 1.014  1.005 - 1.030    pH 5.5  5.0 - 8.0    Glucose, UA NEGATIVE  NEGATIVE (mg/dL)   Hgb urine dipstick NEGATIVE  NEGATIVE    Bilirubin Urine NEGATIVE  NEGATIVE    Ketones, ur NEGATIVE  NEGATIVE (mg/dL)   Protein, ur NEGATIVE  NEGATIVE (mg/dL)   Urobilinogen, UA 0.2  0.0 - 1.0 (mg/dL)   Nitrite NEGATIVE  NEGATIVE    Leukocytes, UA NEGATIVE  NEGATIVE   CBC     Status: Abnormal   Collection Time   10/04/11  7:34 PM      Component Value Range   WBC 20.9 (*) 4.0 - 10.5 (K/uL)   RBC 4.19 (*) 4.22 - 5.81 (MIL/uL)   Hemoglobin 12.3 (*) 13.0 - 17.0 (g/dL)   HCT 81.1 (*) 91.4 - 52.0 (%)   MCV 92.6  78.0 - 100.0 (fL)   MCH 29.4  26.0 - 34.0 (pg)   MCHC 31.7  30.0 - 36.0 (g/dL)   RDW 78.2  95.6 - 21.3 (%)   Platelets 195  150 - 400 (K/uL)  COMPREHENSIVE METABOLIC PANEL     Status: Abnormal   Collection Time   10/04/11  7:34 PM      Component Value Range   Sodium 138  135 - 145 (mEq/L)   Potassium 3.6  3.5 - 5.1 (mEq/L)   Chloride 101  96 - 112 (mEq/L)   CO2 30  19 - 32 (mEq/L)  Glucose, Bld 170 (*) 70 - 99 (mg/dL)   BUN 23  6 - 23 (mg/dL)   Creatinine, Ser 1.61  0.50 - 1.35 (mg/dL)   Calcium 8.8  8.4 - 09.6 (mg/dL)   Total Protein 6.7  6.0 - 8.3 (g/dL)   Albumin 3.0 (*) 3.5 - 5.2 (g/dL)   AST 8  0 - 37 (U/L)   ALT 5  0 - 53 (U/L)   Alkaline Phosphatase 85  39 - 117 (U/L)   Total Bilirubin 0.4  0.3 - 1.2 (mg/dL)   GFR calc non Af Amer 54 (*) >90 (mL/min)   GFR calc Af Amer 63 (*) >90 (mL/min)  MAGNESIUM      Status: Abnormal   Collection Time   10/04/11  7:34 PM      Component Value Range   Magnesium 1.3 (*) 1.5 - 2.5 (mg/dL)  PHOSPHORUS     Status: Abnormal   Collection Time   10/04/11  7:34 PM      Component Value Range   Phosphorus 1.7 (*) 2.3 - 4.6 (mg/dL)  DIFFERENTIAL     Status: Abnormal   Collection Time   10/04/11  7:34 PM      Component Value Range   Neutrophils Relative 89 (*) 43 - 77 (%)   Neutro Abs 18.5 (*) 1.7 - 7.7 (K/uL)   Lymphocytes Relative 6 (*) 12 - 46 (%)   Lymphs Abs 1.3  0.7 - 4.0 (K/uL)   Monocytes Relative 5  3 - 12 (%)   Monocytes Absolute 1.0  0.1 - 1.0 (K/uL)   Eosinophils Relative 0  0 - 5 (%)   Eosinophils Absolute 0.0  0.0 - 0.7 (K/uL)   Basophils Relative 0  0 - 1 (%)   Basophils Absolute 0.0  0.0 - 0.1 (K/uL)  APTT     Status: Normal   Collection Time   10/04/11  7:34 PM      Component Value Range   aPTT 37  24 - 37 (seconds)  TSH     Status: Normal   Collection Time   10/04/11  7:34 PM      Component Value Range   TSH 0.866  0.350 - 4.500 (uIU/mL)  HEMOGLOBIN A1C     Status: Abnormal   Collection Time   10/04/11  7:34 PM      Component Value Range   Hemoglobin A1C 6.6 (*) <5.7 (%)   Mean Plasma Glucose 143 (*) <117 (mg/dL)  GLUCOSE, CAPILLARY     Status: Abnormal   Collection Time   10/04/11  9:31 PM      Component Value Range   Glucose-Capillary 135 (*) 70 - 99 (mg/dL)   Comment 1 Notify RN    PROCALCITONIN     Status: Normal   Collection Time   10/04/11 11:03 PM      Component Value Range   Procalcitonin 0.36    COMPREHENSIVE METABOLIC PANEL     Status: Abnormal   Collection Time   10/05/11  6:38 AM      Component Value Range   Sodium 138  135 - 145 (mEq/L)   Potassium 3.2 (*) 3.5 - 5.1 (mEq/L)   Chloride 99  96 - 112 (mEq/L)   CO2 30  19 - 32 (mEq/L)   Glucose, Bld 89  70 - 99 (mg/dL)   BUN 21  6 - 23 (mg/dL)   Creatinine, Ser 0.45  0.50 - 1.35 (mg/dL)   Calcium 8.9  8.4 -  10.5 (mg/dL)   Total Protein 7.0  6.0 - 8.3 (g/dL)   Albumin  2.9 (*) 3.5 - 5.2 (g/dL)   AST 8  0 - 37 (U/L)   ALT 6  0 - 53 (U/L)   Alkaline Phosphatase 82  39 - 117 (U/L)   Total Bilirubin 0.4  0.3 - 1.2 (mg/dL)   GFR calc non Af Amer 52 (*) >90 (mL/min)   GFR calc Af Amer 61 (*) >90 (mL/min)  CBC     Status: Abnormal   Collection Time   10/05/11  6:38 AM      Component Value Range   WBC 16.4 (*) 4.0 - 10.5 (K/uL)   RBC 4.01 (*) 4.22 - 5.81 (MIL/uL)   Hemoglobin 11.9 (*) 13.0 - 17.0 (g/dL)   HCT 46.9 (*) 62.9 - 52.0 (%)   MCV 92.0  78.0 - 100.0 (fL)   MCH 29.7  26.0 - 34.0 (pg)   MCHC 32.2  30.0 - 36.0 (g/dL)   RDW 52.8  41.3 - 24.4 (%)   Platelets 205  150 - 400 (K/uL)  GLUCOSE, CAPILLARY     Status: Normal   Collection Time   10/05/11  7:55 AM      Component Value Range   Glucose-Capillary 81  70 - 99 (mg/dL)   Comment 1 Notify RN        Studies/Results: Dg Chest 2 View  10/04/2011  *RADIOLOGY REPORT*  Clinical Data: Fever  CHEST - 2 VIEW  Comparison: Chest radiograph 08/31/2011  Findings: Sternotomy wires overlie mildly enlarged heart silhouette.  Central venous pulmonary congestion similar to prior. No effusion, infiltrate, or pneumothorax. Degenerative osteophytosis of the thoracic spine.  IMPRESSION:  Cardiomegaly and central venous congestion.  No focal consolidation.  Original Report Authenticated By: Genevive Bi, M.D.   Ct Head Wo Contrast  10/05/2011  *RADIOLOGY REPORT*  Clinical Data: Altered mental status.  Fevers  CT HEAD WITHOUT CONTRAST  Technique:  Contiguous axial images were obtained from the base of the skull through the vertex without contrast.  Comparison: 11/14/2010  Findings: There is diffuse patchy low density throughout the subcortical and periventricular white matter consistent with chronic small vessel ischemic change.  There is prominence of the sulci and ventricles consistent with brain atrophy.  There is no evidence for acute brain infarct, hemorrhage or mass.  The paranasal sinuses and mastoid air cells are  clear.  The skull appears intact.  IMPRESSION: 1.   No acute intracranial abnormalities. 2.  Small vessel ischemic change and brain atrophy.  Original Report Authenticated By: Rosealee Albee, M.D.    Medications:  Prior to Admission:  Prescriptions prior to admission  Medication Sig Dispense Refill  . allopurinol (ZYLOPRIM) 300 MG tablet Take 300 mg by mouth daily.        Marland Kitchen ALPRAZolam (XANAX) 1 MG tablet Take 1-2 mg by mouth 3 (three) times daily. Takes 1 tablet in the morning and at 430 pm and 2 tablets at bedtime.      Marland Kitchen aspirin 81 MG chewable tablet Chew 1 tablet (81 mg total) by mouth daily.  30 tablet  5  . atenolol (TENORMIN) 50 MG tablet Take 50 mg by mouth 2 (two) times daily.       . brimonidine-timolol (COMBIGAN) 0.2-0.5 % ophthalmic solution Place 1 drop into both eyes every 12 (twelve) hours.        . clopidogrel (PLAVIX) 75 MG tablet Take 75 mg by mouth daily.        Marland Kitchen  divalproex (DEPAKOTE) 250 MG 24 hr tablet Take 250 mg by mouth daily.        . furosemide (LASIX) 80 MG tablet Take 80 mg by mouth 2 (two) times daily.        Marland Kitchen glipiZIDE (GLUCOTROL) 5 MG tablet Take 5 mg by mouth 2 (two) times daily before a meal.        . HYDROcodone-acetaminophen (VICODIN ES) 7.5-750 MG per tablet Take 1 tablet by mouth every 6 (six) hours as needed. For pain      . insulin glargine (LANTUS) 100 UNIT/ML injection Inject 5-10 Units into the skin at bedtime as needed. To sustain blood glucose levels      . latanoprost (XALATAN) 0.005 % ophthalmic solution Place 1 drop into both eyes at bedtime.        . metolazone (ZAROXOLYN) 5 MG tablet Take 5 mg by mouth 2 (two) times a week. Takes on Mondays and Fridays      . nitroGLYCERIN (NITROSTAT) 0.4 MG SL tablet Place 0.4 mg under the tongue every 5 (five) minutes as needed. For chest pain      . olopatadine (PATANOL) 0.1 % ophthalmic solution Place 1 drop into both eyes 2 (two) times daily.        Marland Kitchen omeprazole (PRILOSEC) 20 MG capsule Take 20 mg by  mouth 2 (two) times daily as needed. For heart burn      . potassium chloride SA (K-DUR,KLOR-CON) 20 MEQ tablet Take 2 tablets (40 mEq total) by mouth 2 (two) times daily.  60 tablet  5  . simvastatin (ZOCOR) 20 MG tablet Take 20 mg by mouth daily.       . temazepam (RESTORIL) 30 MG capsule Take 30 mg by mouth at bedtime.       . timolol (BETIMOL) 0.5 % ophthalmic solution Place 1 drop into the left eye 2 (two) times daily.         Scheduled:   . allopurinol  300 mg Oral Daily  . ALPRAZolam  1 mg Oral Custom  . ALPRAZolam  2 mg Oral QHS  . aspirin  81 mg Oral Daily  . atenolol  50 mg Oral BID  . brimonidine  1 drop Both Eyes BID  . clopidogrel  75 mg Oral Q breakfast  . divalproex  250 mg Oral QHS  . enoxaparin  40 mg Subcutaneous Q24H  . furosemide  80 mg Oral BID  . glipiZIDE  5 mg Oral BID AC  . insulin aspart  0-15 Units Subcutaneous TID WC  . insulin aspart  0-5 Units Subcutaneous QHS  . latanoprost  1 drop Both Eyes QHS  . metolazone  5 mg Oral 2 times weekly  . olopatadine  1 drop Both Eyes BID  . pantoprazole  40 mg Oral Q1200  . piperacillin-tazobactam  3.375 g Intravenous Once  . piperacillin-tazobactam (ZOSYN)  IV  3.375 g Intravenous Q8H  . potassium chloride SA  40 mEq Oral BID  . simvastatin  20 mg Oral QPC supper  . sodium chloride  1,000 mL Intravenous Once  . temazepam  30 mg Oral QHS  . timolol  1 drop Both Eyes BID  . vancomycin  1,500 mg Intravenous Q24H  . vancomycin  1,000 mg Intravenous Once  . vancomycin  1,000 mg Intravenous Once  . DISCONTD: ALPRAZolam  1-2 mg Oral TID  . DISCONTD: brimonidine-timolol  1 drop Both Eyes Q12H  . DISCONTD: timolol  1 drop Left Eye BID  .  DISCONTD: vancomycin  2,000 mg Intravenous Once   Continuous:   . sodium chloride    . sodium chloride 75 mL/hr at 10/04/11 2011    Assessment/Plan: Leukocytosis, working diagnoses cellulitis, continue broad-spectrum antibiotics, followup blood cultures. Hypersomnolence probably  related to medications however patient definitely appears and sleep apnea, according to wife noncompliant with CPAP but does use oxygen each bedtime. We'll try to make titrations and medications and add oxygen. Diabetes Congestive heart failure Hypertension History of CVA Chronic kidney disease Sleep apnea Morbid obesity Depression.  LOS: 1 day   Leauna Sharber D 10/05/2011, 1:30 PM

## 2011-10-06 LAB — URINE CULTURE
Culture  Setup Time: 201303020046
Culture: NO GROWTH

## 2011-10-06 LAB — PRO B NATRIURETIC PEPTIDE: Pro B Natriuretic peptide (BNP): 805.6 pg/mL — ABNORMAL HIGH (ref 0–125)

## 2011-10-06 NOTE — Progress Notes (Signed)
Subjective: Patient doing well much more alert today. No complaint of fever chills. Wound care nurses notes have been reviewed.  Objective: Vital signs in last 24 hours: Temp:  [98.2 F (36.8 C)-98.8 F (37.1 C)] 98.2 F (36.8 C) (03/03 0600) Pulse Rate:  [50-69] 60  (03/03 0600) Resp:  [14-18] 18  (03/03 0600) BP: (112-132)/(62-73) 112/63 mmHg (03/03 0600) SpO2:  [98 %-100 %] 98 % (03/03 0600) Weight:  [118.9 kg (262 lb 2 oz)] 118.9 kg (262 lb 2 oz) (03/03 0600) Weight change: -1.6 kg (-3 lb 8.4 oz) Last BM Date: 10/04/11  Intake/Output from previous day: 03/02 0701 - 03/03 0700 In: 750 [IV Piggyback:750] Out: 3700 [Urine:3700] Intake/Output this shift:    General appearance: alert and cooperative Resp: clear to auscultation bilaterally Cardio: regular rate and rhythm, S1, S2 normal, no murmur, click, rub or gallop Extremities: Less edema and erythema to left anterior shin. Also less warmth Sacral decubiti without any foul drainage Lab Results:  Results for orders placed during the hospital encounter of 10/04/11 (from the past 24 hour(s))  GLUCOSE, CAPILLARY     Status: Normal   Collection Time   10/05/11  1:57 PM      Component Value Range   Glucose-Capillary 89  70 - 99 (mg/dL)   Comment 1 Notify RN    GLUCOSE, CAPILLARY     Status: Abnormal   Collection Time   10/05/11  5:15 PM      Component Value Range   Glucose-Capillary 56 (*) 70 - 99 (mg/dL)   Comment 1 Notify RN    GLUCOSE, CAPILLARY     Status: Abnormal   Collection Time   10/05/11  9:42 PM      Component Value Range   Glucose-Capillary 110 (*) 70 - 99 (mg/dL)  PRO B NATRIURETIC PEPTIDE     Status: Abnormal   Collection Time   10/06/11  6:54 AM      Component Value Range   Pro B Natriuretic peptide (BNP) 805.6 (*) 0 - 125 (pg/mL)  GLUCOSE, CAPILLARY     Status: Abnormal   Collection Time   10/06/11  7:26 AM      Component Value Range   Glucose-Capillary 122 (*) 70 - 99 (mg/dL)  GLUCOSE, CAPILLARY      Status: Abnormal   Collection Time   10/06/11 11:32 AM      Component Value Range   Glucose-Capillary 155 (*) 70 - 99 (mg/dL)   Comment 1 Notify RN        Studies/Results: Dg Chest 2 View  10/04/2011  *RADIOLOGY REPORT*  Clinical Data: Fever  CHEST - 2 VIEW  Comparison: Chest radiograph 08/31/2011  Findings: Sternotomy wires overlie mildly enlarged heart silhouette.  Central venous pulmonary congestion similar to prior. No effusion, infiltrate, or pneumothorax. Degenerative osteophytosis of the thoracic spine.  IMPRESSION:  Cardiomegaly and central venous congestion.  No focal consolidation.  Original Report Authenticated By: Genevive Bi, M.D.   Ct Head Wo Contrast  10/05/2011  *RADIOLOGY REPORT*  Clinical Data: Altered mental status.  Fevers  CT HEAD WITHOUT CONTRAST  Technique:  Contiguous axial images were obtained from the base of the skull through the vertex without contrast.  Comparison: 11/14/2010  Findings: There is diffuse patchy low density throughout the subcortical and periventricular white matter consistent with chronic small vessel ischemic change.  There is prominence of the sulci and ventricles consistent with brain atrophy.  There is no evidence for acute brain infarct, hemorrhage or mass.  The paranasal sinuses and mastoid air cells are clear.  The skull appears intact.  IMPRESSION: 1.   No acute intracranial abnormalities. 2.  Small vessel ischemic change and brain atrophy.  Original Report Authenticated By: Rosealee Albee, M.D.    Medications:  Scheduled:   . allopurinol  300 mg Oral Daily  . ALPRAZolam  0.5 mg Oral Custom  . ALPRAZolam  1 mg Oral QHS  . aspirin  81 mg Oral Daily  . atenolol  50 mg Oral BID  . brimonidine  1 drop Both Eyes BID  . clopidogrel  75 mg Oral Q breakfast  . divalproex  250 mg Oral QHS  . enoxaparin  40 mg Subcutaneous Q24H  . furosemide  80 mg Oral BID  . glipiZIDE  5 mg Oral BID AC  . insulin aspart  0-15 Units Subcutaneous TID WC  .  insulin aspart  0-5 Units Subcutaneous QHS  . latanoprost  1 drop Both Eyes QHS  . metolazone  5 mg Oral 2 times weekly  . olopatadine  1 drop Both Eyes BID  . pantoprazole  40 mg Oral Q1200  . piperacillin-tazobactam (ZOSYN)  IV  3.375 g Intravenous Q8H  . potassium chloride SA  40 mEq Oral BID  . simvastatin  20 mg Oral QPC supper  . timolol  1 drop Both Eyes BID  . vancomycin  1,500 mg Intravenous Q24H  . zolpidem  5 mg Oral QHS  . DISCONTD: ALPRAZolam  1 mg Oral Custom  . DISCONTD: ALPRAZolam  2 mg Oral QHS  . DISCONTD: temazepam  30 mg Oral QHS   Continuous:   . sodium chloride    . sodium chloride 75 mL/hr at 10/04/11 2011    Assessment/Plan: Leukocytosis, currently on broad-spectrum antibiotics for presumed cellulitis. Patient sacral decubiti does not appear to be infected. Followup CBC. Blood cultures to date are negative. Hypersomnolence improved continue oxygen, meds have been adjusted. Congestive heart failure continue current meds, BNP slightly elevated, saline lock IV fluids  Hypertension History CVA Chronic kidney disease Sleep apnea per patient not using CPAP Depression Hypokalemia  LOS: 2 days   Brylee Mcgreal D 10/06/2011, 1:24 PM

## 2011-10-06 NOTE — Consult Note (Signed)
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  Patient well known to our department Wound type:Neuropathic, pressure Pressure Ulcer POA: Yes  Patient's caregiver states that wounds are chronic in nature and of long-standing duration.  Left Foot DFU is followed by Dr. Lillia Mountain [facility] Measurement:left DFU: 2cm x 2cm x .4cm; sacral ulcer: 1cm x 5cm x .4cm Wound ZOX:WRUEA, pink, moist Drainage (amount, consistency, odor) scant amount serous drainage on old dressings Periwound:intact on both.  On sacrum, evidence of previous healing (contraction, scarring) evident. Dressing procedure/placement/frequency: I will continue POC in place from Out patient wound care center: calcium alginate dressings changed once-daily. Patient is cooperative with side-to-side repositioning except for meals, so no LAL bed is required.  Patient aware of hi-protein dietary  Focus in the presence of the need for wound healing. Care provider is in agreement with POC and is capable of performing dressing changes at home.   I will not follow, but will remain available to this patient and his medical team.  Please re-consult if needed. Thanks, Ladona Mow, MSN, RN, [facility], CWOCN [phone])

## 2011-10-07 ENCOUNTER — Encounter (HOSPITAL_BASED_OUTPATIENT_CLINIC_OR_DEPARTMENT_OTHER): Payer: Medicare Other | Attending: Internal Medicine

## 2011-10-07 DIAGNOSIS — L03119 Cellulitis of unspecified part of limb: Secondary | ICD-10-CM | POA: Insufficient documentation

## 2011-10-07 DIAGNOSIS — L02419 Cutaneous abscess of limb, unspecified: Secondary | ICD-10-CM | POA: Insufficient documentation

## 2011-10-07 DIAGNOSIS — L8992 Pressure ulcer of unspecified site, stage 2: Secondary | ICD-10-CM | POA: Insufficient documentation

## 2011-10-07 DIAGNOSIS — L89899 Pressure ulcer of other site, unspecified stage: Secondary | ICD-10-CM | POA: Insufficient documentation

## 2011-10-07 DIAGNOSIS — L89109 Pressure ulcer of unspecified part of back, unspecified stage: Secondary | ICD-10-CM | POA: Insufficient documentation

## 2011-10-07 LAB — CBC
HCT: 37.7 % — ABNORMAL LOW (ref 39.0–52.0)
Hemoglobin: 12.1 g/dL — ABNORMAL LOW (ref 13.0–17.0)
MCH: 29.9 pg (ref 26.0–34.0)
MCHC: 32.1 g/dL (ref 30.0–36.0)
MCV: 93.1 fL (ref 78.0–100.0)
Platelets: 197 K/uL (ref 150–400)
RBC: 4.05 MIL/uL — ABNORMAL LOW (ref 4.22–5.81)
RDW: 15.2 % (ref 11.5–15.5)
WBC: 7 K/uL (ref 4.0–10.5)

## 2011-10-07 LAB — BASIC METABOLIC PANEL
BUN: 17 mg/dL (ref 6–23)
Chloride: 99 mEq/L (ref 96–112)
Glucose, Bld: 176 mg/dL — ABNORMAL HIGH (ref 70–99)
Potassium: 3.8 mEq/L (ref 3.5–5.1)

## 2011-10-07 LAB — GLUCOSE, CAPILLARY: Glucose-Capillary: 149 mg/dL — ABNORMAL HIGH (ref 70–99)

## 2011-10-07 MED ORDER — AMOXICILLIN-POT CLAVULANATE 875-125 MG PO TABS: 1.0000 | ORAL_TABLET | Freq: Two times a day (BID) | ORAL | Status: AC

## 2011-10-07 NOTE — Discharge Summary (Signed)
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 Almost certainly not contributing to current illness   Discharge Condition: Much improved  Hospital Course:   Nicholas [name] is a very pleasant 71 year old male who I have followed for many years. 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 He gets recurrent virtually cellulitis and has had multiple admissions for that in the past year. He had been off Augmentin therapy for about 2 weeks on his left lower extremity became erythematous and swollen again and he became confused and he was brought to the [facility] long emergency room for evaluation. With rehydration and antibiotic therapy his left lower extremity is now back to baseline without erythema and minimal edema. Left foot arch ulcer is stable and has been slowly improving. He is now afebrile with a normal white count and ready for discharge home   Filed Vitals:   10/06/11 1500 10/06/11 2200 10/06/11 2224 10/07/11 0500  BP: 144/70 135/70    Pulse: 56 60 60   Temp: 98.2 F (36.8 C) 98.2 F (36.8 C)    TempSrc: Oral Oral    Resp: 18 18    Height:      Weight:    121 kg (266 lb 12.1 oz)  SpO2: 97% 98%      General appearance: alert and cooperative  Resp: clear to auscultation bilaterally  Cardio: regular rate and  rhythm, S1, S2 normal, no murmur, click, rub or gallop  Extremities: Less edema and erythema to left anterior shin. Also less warmth  Sacral decubiti without any foul drainage   Lab Results  Consults: Noncontributory  Disposition: Home-Health Care Svc  Discharge Orders    Future Appointments: Provider: Department: Dept Phone: Center:   10/07/2011 9:45 AM [facility] [phone] [facility]   10/21/2011 11:15 AM V Durene Cal, MD [facility] [phone] [facility]   11/04/2011 8:15 AM [facility] [phone] [facility]   06/22/2012 11:45 AM V Durene Cal, MD [facility] [phone] [facility]     Future Orders Please Complete By Expires   Diet - low sodium heart healthy      Increase activity slowly      Discharge instructions      Comments:   Continue physical therapy at home and notify Dr. Kevan Ny is fever greater than 100.5 occurs or if left lower extremity becomes swollen and red again   Change dressing (specify)      Comments:   Continue dressing changes to left foot ulcer as per prior to hospitalization   Call MD for:  temperature >100.4      Call MD for:  redness, tenderness, or signs of infection (pain, swelling, redness, odor or green/yellow discharge around incision site)      Call MD for:  difficulty breathing, headache or visual disturbances      Discontinue IV  Discontinue IV        Medication List  As of 10/07/2011  7:24 AM   STOP taking these medications         divalproex 250 MG 24 hr tablet         TAKE these medications         allopurinol 300 MG tablet   Commonly known as: ZYLOPRIM   Take 300 mg by mouth daily.      ALPRAZolam 1 MG tablet   Commonly known as: XANAX   Take 1-2 mg by mouth 3 (three) times daily. Takes 1 tablet in the morning and at 430 pm and 2 tablets at bedtime.      amoxicillin-clavulanate 875-125 MG per tablet   Commonly known as: AUGMENTIN   Take 1 tablet by mouth 2 (two) times daily.        aspirin 81 MG chewable tablet   Chew 1 tablet (81 mg total) by mouth daily.      atenolol 50 MG tablet   Commonly known as: TENORMIN   Take 50 mg by mouth 2 (two) times daily.      clopidogrel 75 MG tablet   Commonly known as: PLAVIX   Take 75 mg by mouth daily.      COMBIGAN 0.2-0.5 % ophthalmic solution   Generic drug: brimonidine-timolol   Place 1 drop into both eyes every 12 (twelve) hours.      furosemide 80 MG tablet   Commonly known as: LASIX   Take 80 mg by mouth 2 (two) times daily.      glipiZIDE 5 MG tablet   Commonly known as: GLUCOTROL   Take 5 mg by mouth 2 (two) times daily before a meal.      HYDROcodone-acetaminophen 7.5-750 MG per tablet   Commonly known as: VICODIN ES   Take 1 tablet by mouth every 6 (six) hours as needed. For pain      insulin glargine 100 UNIT/ML injection   Commonly known as: LANTUS   Inject 5-10 Units into the skin at bedtime as needed. To sustain blood glucose levels      latanoprost 0.005 % ophthalmic solution   Commonly known as: XALATAN   Place 1 drop into both eyes at bedtime.      metolazone 5 MG tablet   Commonly known as: ZAROXOLYN   Take 5 mg by mouth 2 (two) times a week. Takes on Mondays and Fridays      nitroGLYCERIN 0.4 MG SL tablet   Commonly known as: NITROSTAT   Place 0.4 mg under the tongue every 5 (five) minutes as needed. For chest pain      olopatadine 0.1 % ophthalmic solution   Commonly known as: PATANOL   Place 1 drop into both eyes 2 (two) times daily.      omeprazole 20 MG capsule   Commonly known as: PRILOSEC   Take 20 mg by mouth 2 (two) times daily as needed. For heart burn      potassium chloride SA 20 MEQ tablet   Commonly known as: K-DUR,KLOR-CON   Take 2 tablets (40 mEq total) by mouth 2 (two) times daily.      simvastatin 20 MG tablet   Commonly known as: ZOCOR   Take 20 mg by mouth daily.      temazepam 30 MG capsule   Commonly known as: RESTORIL   Take 30 mg by mouth at bedtime.       timolol 0.5 % ophthalmic  solution   Commonly known as: BETIMOL   Place 1 drop into the left eye 2 (two) times daily.           Follow-up Information    Follow up with Antion Andres NEVILL, MD .         Things to follow up in the outpatient setting: Monitor left lower extremity for swelling and redness and if any temperature greater than 100.5 occurs or altered mental status, contact M.D.  Time coordinating discharge: 20 minutes  The results of significant diagnostics from this hospitalization (including imaging, microbiology, ancillary and laboratory) are listed below for reference.    Significant Diagnostic Studies: Dg Chest 2 View  10/04/2011  *RADIOLOGY REPORT*  Clinical Data: Fever  CHEST - 2 VIEW  Comparison: Chest radiograph 08/31/2011  Findings: Sternotomy wires overlie mildly enlarged heart silhouette.  Central venous pulmonary congestion similar to prior. No effusion, infiltrate, or pneumothorax. Degenerative osteophytosis of the thoracic spine.  IMPRESSION:  Cardiomegaly and central venous congestion.  No focal consolidation.  Original Report Authenticated By: Genevive Bi, M.D.   Ct Head Wo Contrast  10/05/2011  *RADIOLOGY REPORT*  Clinical Data: Altered mental status.  Fevers  CT HEAD WITHOUT CONTRAST  Technique:  Contiguous axial images were obtained from the base of the skull through the vertex without contrast.  Comparison: 11/14/2010  Findings: There is diffuse patchy low density throughout the subcortical and periventricular white matter consistent with chronic small vessel ischemic change.  There is prominence of the sulci and ventricles consistent with brain atrophy.  There is no evidence for acute brain infarct, hemorrhage or mass.  The paranasal sinuses and mastoid air cells are clear.  The skull appears intact.  IMPRESSION: 1.   No acute intracranial abnormalities. 2.  Small vessel ischemic change and brain atrophy.  Original Report Authenticated By: Rosealee Albee, M.D.    Microbiology: Recent Results (from the past 240 hour(s))  CULTURE, BLOOD (ROUTINE X 2)     Status: Normal (Preliminary result)   Collection Time   10/04/11  3:00 PM      Component Value Range Status Comment   Specimen Description BLOOD IV SITE   Final    Special Requests BOTTLES DRAWN AEROBIC AND ANAEROBIC 10CC   Final    Culture  Setup Time [id number]   Final    Culture     Final    Value:        BLOOD CULTURE RECEIVED NO GROWTH TO DATE CULTURE WILL BE HELD FOR 5 DAYS BEFORE ISSUING A FINAL NEGATIVE REPORT   Report Status PENDING   Incomplete   CULTURE, BLOOD (ROUTINE X 2)     Status: Normal (Preliminary result)   Collection Time   10/04/11  3:45 PM      Component Value Range Status Comment   Specimen Description BLOOD BOTTLES DRAWN AEROBIC AND ANAEROBIC 3CC   Final    Special Requests NONE   Final    Culture  Setup Time [id number]   Final    Culture     Final    Value:        BLOOD CULTURE RECEIVED NO GROWTH TO DATE CULTURE WILL BE HELD FOR 5 DAYS BEFORE ISSUING A FINAL NEGATIVE REPORT   Report Status PENDING   Incomplete   URINE CULTURE     Status: Normal   Collection Time   10/04/11  4:00 PM      Component Value Range Status Comment   Specimen Description URINE, CATHETERIZED  Final    Special Requests NONE   Final    Culture  Setup Time [id number]   Final    Colony Count NO GROWTH   Final    Culture NO GROWTH   Final    Report Status 10/06/2011 FINAL   Final   CULTURE, BLOOD (ROUTINE X 2)     Status: Normal (Preliminary result)   Collection Time   10/04/11 11:00 PM      Component Value Range Status Comment   Specimen Description BLOOD LEFT ANTECUBITAL   Final    Special Requests BOTTLES DRAWN AEROBIC AND ANAEROBIC 3CC   Final    Culture  Setup Time [id number]   Final    Culture     Final    Value:        BLOOD CULTURE RECEIVED NO GROWTH TO DATE CULTURE WILL BE HELD FOR 5 DAYS BEFORE ISSUING A FINAL NEGATIVE REPORT   Report Status PENDING   Incomplete     CULTURE, BLOOD (ROUTINE X 2)     Status: Normal (Preliminary result)   Collection Time   10/04/11 11:03 PM      Component Value Range Status Comment   Specimen Description BLOOD LEFT HAND   Final    Special Requests BOTTLES DRAWN AEROBIC AND ANAEROBIC 2CC   Final    Culture  Setup Time [id number]   Final    Culture     Final    Value:        BLOOD CULTURE RECEIVED NO GROWTH TO DATE CULTURE WILL BE HELD FOR 5 DAYS BEFORE ISSUING A FINAL NEGATIVE REPORT   Report Status PENDING   Incomplete      Labs: Results for orders placed during the hospital encounter of 10/04/11  BASIC METABOLIC PANEL      Component Value Range   Sodium 139  135 - 145 (mEq/L)   Potassium 3.9  3.5 - 5.1 (mEq/L)   Chloride 99  96 - 112 (mEq/L)   CO2 29  19 - 32 (mEq/L)   Glucose, Bld 128 (*) 70 - 99 (mg/dL)   BUN 25 (*) 6 - 23 (mg/dL)   Creatinine, Ser 6.29  0.50 - 1.35 (mg/dL)   Calcium 9.8  8.4 - 52.8 (mg/dL)   GFR calc non Af Amer 54 (*) >90 (mL/min)   GFR calc Af Amer 62 (*) >90 (mL/min)  CBC      Component Value Range   WBC 20.0 (*) 4.0 - 10.5 (K/uL)   RBC 4.72  4.22 - 5.81 (MIL/uL)   Hemoglobin 14.2  13.0 - 17.0 (g/dL)   HCT 41.3  24.4 - 01.0 (%)   MCV 93.9  78.0 - 100.0 (fL)   MCH 30.1  26.0 - 34.0 (pg)   MCHC 32.1  30.0 - 36.0 (g/dL)   RDW 27.2  53.6 - 64.4 (%)   Platelets 224  150 - 400 (K/uL)  DIFFERENTIAL      Component Value Range   Neutrophils Relative 85 (*) 43 - 77 (%)   Neutro Abs 16.9 (*) 1.7 - 7.7 (K/uL)   Lymphocytes Relative 9 (*) 12 - 46 (%)   Lymphs Abs 1.8  0.7 - 4.0 (K/uL)   Monocytes Relative 5  3 - 12 (%)   Monocytes Absolute 1.1 (*) 0.1 - 1.0 (K/uL)   Eosinophils Relative 1  0 - 5 (%)   Eosinophils Absolute 0.2  0.0 - 0.7 (K/uL)   Basophils Relative 0  0 - 1 (%)  Basophils Absolute 0.0  0.0 - 0.1 (K/uL)  LACTIC ACID, PLASMA      Component Value Range   Lactic Acid, Venous 2.1  0.5 - 2.2 (mmol/L)  PROCALCITONIN      Component Value Range   Procalcitonin 0.15     URINALYSIS, ROUTINE W REFLEX MICROSCOPIC      Component Value Range   Color, Urine YELLOW  YELLOW    APPearance CLEAR  CLEAR    Specific Gravity, Urine 1.014  1.005 - 1.030    pH 5.5  5.0 - 8.0    Glucose, UA NEGATIVE  NEGATIVE (mg/dL)   Hgb urine dipstick NEGATIVE  NEGATIVE    Bilirubin Urine NEGATIVE  NEGATIVE    Ketones, ur NEGATIVE  NEGATIVE (mg/dL)   Protein, ur NEGATIVE  NEGATIVE (mg/dL)   Urobilinogen, UA 0.2  0.0 - 1.0 (mg/dL)   Nitrite NEGATIVE  NEGATIVE    Leukocytes, UA NEGATIVE  NEGATIVE   URINE CULTURE      Component Value Range   Specimen Description URINE, CATHETERIZED     Special Requests NONE     Culture  Setup Time [id number]     Colony Count NO GROWTH     Culture NO GROWTH     Report Status 10/06/2011 FINAL    CULTURE, BLOOD (ROUTINE X 2)      Component Value Range   Specimen Description BLOOD BOTTLES DRAWN AEROBIC AND ANAEROBIC 3CC     Special Requests NONE     Culture  Setup Time [id number]     Culture       Value:        BLOOD CULTURE RECEIVED NO GROWTH TO DATE CULTURE WILL BE HELD FOR 5 DAYS BEFORE ISSUING A FINAL NEGATIVE REPORT   Report Status PENDING    CULTURE, BLOOD (ROUTINE X 2)      Component Value Range   Specimen Description BLOOD IV SITE     Special Requests BOTTLES DRAWN AEROBIC AND ANAEROBIC 10CC     Culture  Setup Time [id number]     Culture       Value:        BLOOD CULTURE RECEIVED NO GROWTH TO DATE CULTURE WILL BE HELD FOR 5 DAYS BEFORE ISSUING A FINAL NEGATIVE REPORT   Report Status PENDING    CBC      Component Value Range   WBC 20.9 (*) 4.0 - 10.5 (K/uL)   RBC 4.19 (*) 4.22 - 5.81 (MIL/uL)   Hemoglobin 12.3 (*) 13.0 - 17.0 (g/dL)   HCT 62.9 (*) 52.8 - 52.0 (%)   MCV 92.6  78.0 - 100.0 (fL)   MCH 29.4  26.0 - 34.0 (pg)   MCHC 31.7  30.0 - 36.0 (g/dL)   RDW 41.3  24.4 - 01.0 (%)   Platelets 195  150 - 400 (K/uL)  COMPREHENSIVE METABOLIC PANEL      Component Value Range   Sodium 138  135 - 145 (mEq/L)   Potassium 3.6   3.5 - 5.1 (mEq/L)   Chloride 101  96 - 112 (mEq/L)   CO2 30  19 - 32 (mEq/L)   Glucose, Bld 170 (*) 70 - 99 (mg/dL)   BUN 23  6 - 23 (mg/dL)   Creatinine, Ser 2.72  0.50 - 1.35 (mg/dL)   Calcium 8.8  8.4 - 53.6 (mg/dL)   Total Protein 6.7  6.0 - 8.3 (g/dL)   Albumin 3.0 (*) 3.5 - 5.2 (g/dL)   AST 8  0 -  37 (U/L)   ALT 5  0 - 53 (U/L)   Alkaline Phosphatase 85  39 - 117 (U/L)   Total Bilirubin 0.4  0.3 - 1.2 (mg/dL)   GFR calc non Af Amer 54 (*) >90 (mL/min)   GFR calc Af Amer 63 (*) >90 (mL/min)  MAGNESIUM      Component Value Range   Magnesium 1.3 (*) 1.5 - 2.5 (mg/dL)  PHOSPHORUS      Component Value Range   Phosphorus 1.7 (*) 2.3 - 4.6 (mg/dL)  DIFFERENTIAL      Component Value Range   Neutrophils Relative 89 (*) 43 - 77 (%)   Neutro Abs 18.5 (*) 1.7 - 7.7 (K/uL)   Lymphocytes Relative 6 (*) 12 - 46 (%)   Lymphs Abs 1.3  0.7 - 4.0 (K/uL)   Monocytes Relative 5  3 - 12 (%)   Monocytes Absolute 1.0  0.1 - 1.0 (K/uL)   Eosinophils Relative 0  0 - 5 (%)   Eosinophils Absolute 0.0  0.0 - 0.7 (K/uL)   Basophils Relative 0  0 - 1 (%)   Basophils Absolute 0.0  0.0 - 0.1 (K/uL)  APTT      Component Value Range   aPTT 37  24 - 37 (seconds)  TSH      Component Value Range   TSH 0.866  0.350 - 4.500 (uIU/mL)  HEMOGLOBIN A1C      Component Value Range   Hemoglobin A1C 6.6 (*) <5.7 (%)   Mean Plasma Glucose 143 (*) <117 (mg/dL)  COMPREHENSIVE METABOLIC PANEL      Component Value Range   Sodium 138  135 - 145 (mEq/L)   Potassium 3.2 (*) 3.5 - 5.1 (mEq/L)   Chloride 99  96 - 112 (mEq/L)   CO2 30  19 - 32 (mEq/L)   Glucose, Bld 89  70 - 99 (mg/dL)   BUN 21  6 - 23 (mg/dL)   Creatinine, Ser 1.61  0.50 - 1.35 (mg/dL)   Calcium 8.9  8.4 - 09.6 (mg/dL)   Total Protein 7.0  6.0 - 8.3 (g/dL)   Albumin 2.9 (*) 3.5 - 5.2 (g/dL)   AST 8  0 - 37 (U/L)   ALT 6  0 - 53 (U/L)   Alkaline Phosphatase 82  39 - 117 (U/L)   Total Bilirubin 0.4  0.3 - 1.2 (mg/dL)   GFR calc non Af Amer 52  (*) >90 (mL/min)   GFR calc Af Amer 61 (*) >90 (mL/min)  CBC      Component Value Range   WBC 16.4 (*) 4.0 - 10.5 (K/uL)   RBC 4.01 (*) 4.22 - 5.81 (MIL/uL)   Hemoglobin 11.9 (*) 13.0 - 17.0 (g/dL)   HCT 04.5 (*) 40.9 - 52.0 (%)   MCV 92.0  78.0 - 100.0 (fL)   MCH 29.7  26.0 - 34.0 (pg)   MCHC 32.2  30.0 - 36.0 (g/dL)   RDW 81.1  91.4 - 78.2 (%)   Platelets 205  150 - 400 (K/uL)  GLUCOSE, CAPILLARY      Component Value Range   Glucose-Capillary 135 (*) 70 - 99 (mg/dL)   Comment 1 Notify RN    PROCALCITONIN      Component Value Range   Procalcitonin 0.36    CULTURE, BLOOD (ROUTINE X 2)      Component Value Range   Specimen Description BLOOD LEFT HAND     Special Requests BOTTLES DRAWN AEROBIC AND ANAEROBIC 2CC  Culture  Setup Time [id number]     Culture       Value:        BLOOD CULTURE RECEIVED NO GROWTH TO DATE CULTURE WILL BE HELD FOR 5 DAYS BEFORE ISSUING A FINAL NEGATIVE REPORT   Report Status PENDING    CULTURE, BLOOD (ROUTINE X 2)      Component Value Range   Specimen Description BLOOD LEFT ANTECUBITAL     Special Requests BOTTLES DRAWN AEROBIC AND ANAEROBIC 3CC     Culture  Setup Time [id number]     Culture       Value:        BLOOD CULTURE RECEIVED NO GROWTH TO DATE CULTURE WILL BE HELD FOR 5 DAYS BEFORE ISSUING A FINAL NEGATIVE REPORT   Report Status PENDING    GLUCOSE, CAPILLARY      Component Value Range   Glucose-Capillary 81  70 - 99 (mg/dL)   Comment 1 Notify RN    GLUCOSE, CAPILLARY      Component Value Range   Glucose-Capillary 89  70 - 99 (mg/dL)   Comment 1 Notify RN    PRO B NATRIURETIC PEPTIDE      Component Value Range   Pro B Natriuretic peptide (BNP) 805.6 (*) 0 - 125 (pg/mL)  GLUCOSE, CAPILLARY      Component Value Range   Glucose-Capillary 56 (*) 70 - 99 (mg/dL)   Comment 1 Notify RN    GLUCOSE, CAPILLARY      Component Value Range   Glucose-Capillary 110 (*) 70 - 99 (mg/dL)  GLUCOSE, CAPILLARY      Component Value Range    Glucose-Capillary 122 (*) 70 - 99 (mg/dL)  GLUCOSE, CAPILLARY      Component Value Range   Glucose-Capillary 155 (*) 70 - 99 (mg/dL)   Comment 1 Notify RN    BASIC METABOLIC PANEL      Component Value Range   Sodium 139  135 - 145 (mEq/L)   Potassium 3.8  3.5 - 5.1 (mEq/L)   Chloride 99  96 - 112 (mEq/L)   CO2 33 (*) 19 - 32 (mEq/L)   Glucose, Bld 176 (*) 70 - 99 (mg/dL)   BUN 17  6 - 23 (mg/dL)   Creatinine, Ser 2.13  0.50 - 1.35 (mg/dL)   Calcium 8.9  8.4 - 08.6 (mg/dL)   GFR calc non Af Amer 58 (*) >90 (mL/min)   GFR calc Af Amer 67 (*) >90 (mL/min)  CBC      Component Value Range   WBC 7.0  4.0 - 10.5 (K/uL)   RBC 4.05 (*) 4.22 - 5.81 (MIL/uL)   Hemoglobin 12.1 (*) 13.0 - 17.0 (g/dL)   HCT 57.8 (*) 46.9 - 52.0 (%)   MCV 93.1  78.0 - 100.0 (fL)   MCH 29.9  26.0 - 34.0 (pg)   MCHC 32.1  30.0 - 36.0 (g/dL)   RDW 62.9  52.8 - 41.3 (%)   Platelets 197  150 - 400 (K/uL)  GLUCOSE, CAPILLARY      Component Value Range   Glucose-Capillary 127 (*) 70 - 99 (mg/dL)  GLUCOSE, CAPILLARY      Component Value Range   Glucose-Capillary 149 (*) 70 - 99 (mg/dL)     Signed: Hillard Goodwine NEVILL 10/07/2011, 7:24 AM

## 2011-10-10 LAB — CULTURE, BLOOD (ROUTINE X 2)
Culture  Setup Time: 201303012254
Culture: NO GROWTH
Culture: NO GROWTH

## 2011-10-11 LAB — CULTURE, BLOOD (ROUTINE X 2): Culture: NO GROWTH

## 2011-10-15 ENCOUNTER — Other Ambulatory Visit: Payer: Self-pay | Admitting: *Deleted

## 2011-10-15 ENCOUNTER — Encounter: Payer: Self-pay | Admitting: Surgery

## 2011-10-15 DIAGNOSIS — Z48812 Encounter for surgical aftercare following surgery on the circulatory system: Secondary | ICD-10-CM

## 2011-10-15 DIAGNOSIS — I739 Peripheral vascular disease, unspecified: Secondary | ICD-10-CM

## 2011-10-21 ENCOUNTER — Ambulatory Visit: Payer: Medicare Other | Admitting: Surgery

## 2011-11-04 ENCOUNTER — Encounter (HOSPITAL_BASED_OUTPATIENT_CLINIC_OR_DEPARTMENT_OTHER): Payer: Medicare Other

## 2011-11-05 ENCOUNTER — Encounter (HOSPITAL_BASED_OUTPATIENT_CLINIC_OR_DEPARTMENT_OTHER): Payer: Medicare Other | Attending: Internal Medicine

## 2011-11-05 DIAGNOSIS — E1169 Type 2 diabetes mellitus with other specified complication: Secondary | ICD-10-CM | POA: Insufficient documentation

## 2011-11-05 DIAGNOSIS — L8992 Pressure ulcer of unspecified site, stage 2: Secondary | ICD-10-CM | POA: Insufficient documentation

## 2011-11-05 DIAGNOSIS — L89109 Pressure ulcer of unspecified part of back, unspecified stage: Secondary | ICD-10-CM | POA: Insufficient documentation

## 2011-11-05 DIAGNOSIS — L97409 Non-pressure chronic ulcer of unspecified heel and midfoot with unspecified severity: Secondary | ICD-10-CM | POA: Insufficient documentation

## 2011-11-18 ENCOUNTER — Ambulatory Visit: Payer: Medicare Other | Admitting: Surgery

## 2011-12-09 ENCOUNTER — Encounter (HOSPITAL_BASED_OUTPATIENT_CLINIC_OR_DEPARTMENT_OTHER): Payer: Medicare Other | Attending: Internal Medicine

## 2011-12-09 DIAGNOSIS — Z79899 Other long term (current) drug therapy: Secondary | ICD-10-CM | POA: Insufficient documentation

## 2011-12-09 DIAGNOSIS — L97509 Non-pressure chronic ulcer of other part of unspecified foot with unspecified severity: Secondary | ICD-10-CM | POA: Insufficient documentation

## 2011-12-09 DIAGNOSIS — I509 Heart failure, unspecified: Secondary | ICD-10-CM | POA: Insufficient documentation

## 2011-12-09 DIAGNOSIS — Z794 Long term (current) use of insulin: Secondary | ICD-10-CM | POA: Insufficient documentation

## 2011-12-09 DIAGNOSIS — L8990 Pressure ulcer of unspecified site, unspecified stage: Secondary | ICD-10-CM | POA: Insufficient documentation

## 2011-12-09 DIAGNOSIS — I10 Essential (primary) hypertension: Secondary | ICD-10-CM | POA: Insufficient documentation

## 2011-12-09 DIAGNOSIS — E785 Hyperlipidemia, unspecified: Secondary | ICD-10-CM | POA: Insufficient documentation

## 2011-12-09 DIAGNOSIS — L02419 Cutaneous abscess of limb, unspecified: Secondary | ICD-10-CM | POA: Insufficient documentation

## 2011-12-09 DIAGNOSIS — L89109 Pressure ulcer of unspecified part of back, unspecified stage: Secondary | ICD-10-CM | POA: Insufficient documentation

## 2011-12-09 DIAGNOSIS — E1169 Type 2 diabetes mellitus with other specified complication: Secondary | ICD-10-CM | POA: Insufficient documentation

## 2011-12-10 ENCOUNTER — Emergency Department (HOSPITAL_COMMUNITY): Payer: Medicare Other

## 2011-12-10 ENCOUNTER — Encounter (HOSPITAL_COMMUNITY): Payer: Self-pay | Admitting: Emergency Medicine

## 2011-12-10 ENCOUNTER — Inpatient Hospital Stay (HOSPITAL_COMMUNITY)
Admission: EM | Admit: 2011-12-10 | Discharge: 2011-12-12 | DRG: 638 | Disposition: A | Discharge status: Home / Self Care | Payer: Medicare Other | Attending: Internal Medicine | Admitting: Internal Medicine

## 2011-12-10 DIAGNOSIS — R0902 Hypoxemia: Secondary | ICD-10-CM

## 2011-12-10 DIAGNOSIS — F3289 Other specified depressive episodes: Secondary | ICD-10-CM | POA: Diagnosis present

## 2011-12-10 DIAGNOSIS — I252 Old myocardial infarction: Secondary | ICD-10-CM

## 2011-12-10 DIAGNOSIS — Z6839 Body mass index (BMI) 39.0-39.9, adult: Secondary | ICD-10-CM

## 2011-12-10 DIAGNOSIS — I2510 Atherosclerotic heart disease of native coronary artery without angina pectoris: Secondary | ICD-10-CM | POA: Diagnosis present

## 2011-12-10 DIAGNOSIS — Z951 Presence of aortocoronary bypass graft: Secondary | ICD-10-CM

## 2011-12-10 DIAGNOSIS — L97509 Non-pressure chronic ulcer of other part of unspecified foot with unspecified severity: Secondary | ICD-10-CM | POA: Diagnosis present

## 2011-12-10 DIAGNOSIS — I509 Heart failure, unspecified: Secondary | ICD-10-CM | POA: Diagnosis present

## 2011-12-10 DIAGNOSIS — F411 Generalized anxiety disorder: Secondary | ICD-10-CM | POA: Diagnosis present

## 2011-12-10 DIAGNOSIS — Z87891 Personal history of nicotine dependence: Secondary | ICD-10-CM

## 2011-12-10 DIAGNOSIS — E1169 Type 2 diabetes mellitus with other specified complication: Principal | ICD-10-CM | POA: Diagnosis present

## 2011-12-10 DIAGNOSIS — R05 Cough: Secondary | ICD-10-CM

## 2011-12-10 DIAGNOSIS — L02619 Cutaneous abscess of unspecified foot: Secondary | ICD-10-CM | POA: Diagnosis present

## 2011-12-10 DIAGNOSIS — G40909 Epilepsy, unspecified, not intractable, without status epilepticus: Secondary | ICD-10-CM | POA: Diagnosis present

## 2011-12-10 DIAGNOSIS — E1159 Type 2 diabetes mellitus with other circulatory complications: Secondary | ICD-10-CM | POA: Diagnosis present

## 2011-12-10 DIAGNOSIS — I10 Essential (primary) hypertension: Secondary | ICD-10-CM | POA: Diagnosis present

## 2011-12-10 DIAGNOSIS — A5211 Tabes dorsalis: Secondary | ICD-10-CM | POA: Diagnosis present

## 2011-12-10 DIAGNOSIS — I739 Peripheral vascular disease, unspecified: Secondary | ICD-10-CM | POA: Diagnosis present

## 2011-12-10 DIAGNOSIS — K219 Gastro-esophageal reflux disease without esophagitis: Secondary | ICD-10-CM | POA: Diagnosis present

## 2011-12-10 DIAGNOSIS — E785 Hyperlipidemia, unspecified: Secondary | ICD-10-CM | POA: Diagnosis present

## 2011-12-10 DIAGNOSIS — D72829 Elevated white blood cell count, unspecified: Secondary | ICD-10-CM | POA: Diagnosis present

## 2011-12-10 DIAGNOSIS — L03116 Cellulitis of left lower limb: Secondary | ICD-10-CM

## 2011-12-10 DIAGNOSIS — G4730 Sleep apnea, unspecified: Secondary | ICD-10-CM | POA: Diagnosis present

## 2011-12-10 DIAGNOSIS — F329 Major depressive disorder, single episode, unspecified: Secondary | ICD-10-CM | POA: Diagnosis present

## 2011-12-10 DIAGNOSIS — Z8673 Personal history of transient ischemic attack (TIA), and cerebral infarction without residual deficits: Secondary | ICD-10-CM

## 2011-12-10 DIAGNOSIS — H409 Unspecified glaucoma: Secondary | ICD-10-CM | POA: Diagnosis present

## 2011-12-10 DIAGNOSIS — Z79899 Other long term (current) drug therapy: Secondary | ICD-10-CM

## 2011-12-10 DIAGNOSIS — M109 Gout, unspecified: Secondary | ICD-10-CM | POA: Diagnosis present

## 2011-12-10 DIAGNOSIS — J189 Pneumonia, unspecified organism: Secondary | ICD-10-CM

## 2011-12-10 DIAGNOSIS — R509 Fever, unspecified: Secondary | ICD-10-CM

## 2011-12-10 DIAGNOSIS — N400 Enlarged prostate without lower urinary tract symptoms: Secondary | ICD-10-CM | POA: Diagnosis present

## 2011-12-10 DIAGNOSIS — Z794 Long term (current) use of insulin: Secondary | ICD-10-CM

## 2011-12-10 DIAGNOSIS — Z66 Do not resuscitate: Secondary | ICD-10-CM | POA: Diagnosis present

## 2011-12-10 DIAGNOSIS — Z7902 Long term (current) use of antithrombotics/antiplatelets: Secondary | ICD-10-CM

## 2011-12-10 HISTORY — DX: Cerebral infarction, unspecified: I63.9

## 2011-12-10 HISTORY — DX: Angina pectoris, unspecified: I20.9

## 2011-12-10 HISTORY — DX: Headache: R51

## 2011-12-10 HISTORY — DX: Headache, unspecified: R51.9

## 2011-12-10 HISTORY — DX: Pneumonia, unspecified organism: J18.9

## 2011-12-10 HISTORY — DX: Unspecified convulsions: R56.9

## 2011-12-10 HISTORY — DX: Type 2 diabetes mellitus without complications: E11.9

## 2011-12-10 HISTORY — DX: Gastro-esophageal reflux disease without esophagitis: K21.9

## 2011-12-10 LAB — PRO B NATRIURETIC PEPTIDE: Pro B Natriuretic peptide (BNP): 178.7 pg/mL — ABNORMAL HIGH (ref 0–125)

## 2011-12-10 LAB — COMPREHENSIVE METABOLIC PANEL
Alkaline Phosphatase: 112 U/L (ref 39–117)
BUN: 23 mg/dL (ref 6–23)
CO2: 28 mEq/L (ref 19–32)
Calcium: 9.5 mg/dL (ref 8.4–10.5)
GFR calc Af Amer: 67 mL/min — ABNORMAL LOW (ref >90)
GFR calc non Af Amer: 58 mL/min — ABNORMAL LOW (ref >90)
Glucose, Bld: 159 mg/dL — ABNORMAL HIGH (ref 70–99)
Total Protein: 7.6 g/dL (ref 6.0–8.3)

## 2011-12-10 LAB — URINALYSIS, ROUTINE W REFLEX MICROSCOPIC
Glucose, UA: NEGATIVE mg/dL
Hgb urine dipstick: NEGATIVE
Ketones, ur: NEGATIVE mg/dL
Protein, ur: 100 mg/dL — AB
Urobilinogen, UA: 0.2 mg/dL (ref 0.0–1.0)

## 2011-12-10 LAB — CBC
HCT: 44.9 % (ref 39.0–52.0)
Hemoglobin: 14.6 g/dL (ref 13.0–17.0)
MCH: 30.4 pg (ref 26.0–34.0)
MCV: 93.5 fL (ref 78.0–100.0)
RBC: 4.8 MIL/uL (ref 4.22–5.81)

## 2011-12-10 LAB — URINE MICROSCOPIC-ADD ON

## 2011-12-10 LAB — DIFFERENTIAL
Eosinophils Absolute: 0.2 10*3/uL (ref 0.0–0.7)
Eosinophils Relative: 1 % (ref 0–5)
Lymphs Abs: 1.6 10*3/uL (ref 0.7–4.0)
Monocytes Relative: 6 % (ref 3–12)

## 2011-12-10 LAB — LACTIC ACID, PLASMA: Lactic Acid, Venous: 3.1 mmol/L — ABNORMAL HIGH (ref 0.5–2.2)

## 2011-12-10 MED ORDER — PIPERACILLIN-TAZOBACTAM 4.5 G IVPB
4.5000 g | Freq: Once | INTRAVENOUS | Status: AC
  Administered 2011-12-10: 4.5 g via INTRAVENOUS
  Filled 2011-12-10: qty 100

## 2011-12-10 MED ORDER — ACETAMINOPHEN 500 MG PO TABS
1000.0000 mg | ORAL_TABLET | Freq: Once | ORAL | Status: AC
  Administered 2011-12-10: 1000 mg via ORAL
  Filled 2011-12-10: qty 2

## 2011-12-10 MED ORDER — SODIUM CHLORIDE 0.9 % IV BOLUS (SEPSIS)
250.0000 mL | Freq: Once | INTRAVENOUS | Status: AC
  Administered 2011-12-10: 250 mL via INTRAVENOUS

## 2011-12-10 MED ORDER — VANCOMYCIN HCL 1000 MG IV SOLR
2000.0000 mg | Freq: Once | INTRAVENOUS | Status: AC
  Administered 2011-12-10: 2000 mg via INTRAVENOUS
  Filled 2011-12-10: qty 2000

## 2011-12-10 MED ORDER — PIPERACILLIN-TAZOBACTAM 3.375 G IVPB
INTRAVENOUS | Status: AC
  Filled 2011-12-10: qty 50

## 2011-12-10 MED ORDER — VANCOMYCIN HCL IN DEXTROSE 1-5 GM/200ML-% IV SOLN
INTRAVENOUS | Status: AC
  Filled 2011-12-10: qty 200

## 2011-12-10 NOTE — ED Provider Notes (Signed)
History     CSN: [id number]  Arrival date & time 12/10/11  2054   First MD Initiated Contact with Patient 12/10/11 2100      Chief Complaint  Patient presents with  . Shortness of Breath    (Consider location/radiation/quality/duration/timing/severity/associated sxs/prior treatment) Patient is a 71 y.o. male presenting with shortness of breath. The history is provided by the patient.  Shortness of Breath  The current episode started today. The problem occurs occasionally. The problem has been unchanged. The problem is moderate. The symptoms are relieved by nothing. The symptoms are aggravated by nothing. Associated symptoms include cough and shortness of breath. Pertinent negatives include no chest pain, no chest pressure and no fever. The cough is non-productive. Recently, medical care has been given at this facility. Services received include medications given and tests performed.    Past Medical History  Diagnosis Date  . CHF (congestive heart failure)   . Hypertension   . Diabetes mellitus   . CVA (cerebral infarction)   . Epilepsy   . Hx of CABG   . Peripheral vascular disease   . Ulcer     left foot  . Cellulitis   . Chronic kidney disease   . Dyslipidemia   . Chronic venous insufficiency   . Sleep apnea   . Morbid obesity   . Diverticulosis   . Benign prostatic hypertrophy   . Glaucoma   . Staphylococcus aureus infection   . Gout   . Depression   . Insomnia   . Reflux   . Coronary artery disease   . CAD (coronary artery disease), native coronary artery     Past Surgical History  Procedure Date  . Coronary artery bypass graft     1992, redo in 1983  . Appendectomy   . Cholecystectomy, laparoscopic   . Lumbar laminectomy   . Hernia repair   . Mastoid debridement   . Knee and ankle surgery     Family History  Problem Relation Age of Onset  . Hypertension Other   . Cancer Other     History  Substance Use Topics  . Smoking status: Former Smoker    Types: Cigarettes    Quit date: 07/29/1991  . Smokeless tobacco: Never Used  . Alcohol Use: No     occasionallly      Review of Systems  Constitutional: Negative for fever.  HENT: Negative for neck pain.   Respiratory: Positive for cough and shortness of breath. Negative for chest tightness.   Cardiovascular: Negative for chest pain.  Gastrointestinal: Positive for nausea and vomiting. Negative for abdominal pain and diarrhea.  Skin: Negative for rash.  Neurological: Negative for numbness and headaches.  All other systems reviewed and are negative.    Allergies  Review of patient's allergies indicates no known allergies.  Home Medications   Current Outpatient Rx  Name Route Sig Dispense Refill  . ALLOPURINOL 300 MG PO TABS Oral Take 300 mg by mouth daily.      Marland Kitchen ALPRAZOLAM 1 MG PO TABS Oral Take 1-2 mg by mouth 3 (three) times daily. Takes 1 tablet in the morning and at 430 pm and 2 tablets at bedtime.    . ASPIRIN 81 MG PO CHEW Oral Chew 1 tablet (81 mg total) by mouth daily. 30 tablet 5  . ATENOLOL 50 MG PO TABS Oral Take 50 mg by mouth 2 (two) times daily.     Marland Kitchen BRIMONIDINE TARTRATE-TIMOLOL 0.2-0.5 % OP SOLN Both Eyes Place 1  drop into both eyes every 12 (twelve) hours.      . CLOPIDOGREL BISULFATE 75 MG PO TABS Oral Take 75 mg by mouth daily.      . FUROSEMIDE 80 MG PO TABS Oral Take 80 mg by mouth 2 (two) times daily.      Marland Kitchen GLIPIZIDE 5 MG PO TABS Oral Take 5 mg by mouth 2 (two) times daily before a meal.      . HYDROCODONE-ACETAMINOPHEN 7.5-750 MG PO TABS Oral Take 1 tablet by mouth every 6 (six) hours as needed. For pain    . INSULIN GLARGINE 100 UNIT/ML Eva SOLN Subcutaneous Inject 5-10 Units into the skin at bedtime as needed. To sustain blood glucose levels    . LATANOPROST 0.005 % OP SOLN Both Eyes Place 1 drop into both eyes at bedtime.      Marland Kitchen METOLAZONE 5 MG PO TABS Oral Take 5 mg by mouth 2 (two) times a week. Takes on Mondays and Fridays    . NITROGLYCERIN 0.4  MG SL SUBL Sublingual Place 0.4 mg under the tongue every 5 (five) minutes as needed. For chest pain    . OLOPATADINE HCL 0.1 % OP SOLN Both Eyes Place 1 drop into both eyes 2 (two) times daily.      Marland Kitchen OMEPRAZOLE 20 MG PO CPDR Oral Take 20 mg by mouth 2 (two) times daily as needed. For heart burn    . POTASSIUM CHLORIDE CRYS ER 20 MEQ PO TBCR Oral Take 2 tablets (40 mEq total) by mouth 2 (two) times daily. 60 tablet 5  . SIMVASTATIN 20 MG PO TABS Oral Take 20 mg by mouth daily.     Marland Kitchen TEMAZEPAM 30 MG PO CAPS Oral Take 30 mg by mouth at bedtime.     Marland Kitchen TIMOLOL HEMIHYDRATE 0.5 % OP SOLN Left Eye Place 1 drop into the left eye 2 (two) times daily.        BP 197/58  Temp(Src) 104.5 F (40.3 C) (Rectal)  Resp 20  SpO2 99%  Physical Exam  Constitutional: He is oriented to person, place, and time. He appears well-developed and well-nourished.  HENT:  Head: Normocephalic and atraumatic.  Eyes: EOM are normal.  Neck: Normal range of motion.  Cardiovascular: Normal rate, regular rhythm and normal heart sounds.   Pulmonary/Chest: Tachypnea noted. No respiratory distress. He has decreased breath sounds in the left upper field and the left lower field.  Abdominal: Soft. He exhibits no distension. There is no tenderness. There is no rebound.  Musculoskeletal: Normal range of motion. He exhibits edema.  Neurological: He is alert and oriented to person, place, and time.  Skin: Skin is warm and dry.  Psychiatric: He has a normal mood and affect.    ED Course  Procedures (including critical care time)  Date: 12/10/2011  Rate:84  Rhythm: normal sinus rhythm  QRS Axis: left  Intervals: normal  ST/T Wave abnormalities: normal  Conduction Disutrbances:nonspecific intraventricular conduction delay  Narrative Interpretation:   Old EKG Reviewed: unchanged   Labs Reviewed  CBC - Abnormal; Notable for the following:    WBC 17.0 (*)    All other components within normal limits  DIFFERENTIAL -  Abnormal; Notable for the following:    Neutrophils Relative 83 (*)    Neutro Abs 14.2 (*)    Lymphocytes Relative 9 (*)    Monocytes Absolute 1.1 (*)    All other components within normal limits  COMPREHENSIVE METABOLIC PANEL - Abnormal; Notable for the  following:    Glucose, Bld 159 (*)    GFR calc non Af Amer 58 (*)    GFR calc Af Amer 67 (*)    All other components within normal limits  PRO B NATRIURETIC PEPTIDE - Abnormal; Notable for the following:    Pro B Natriuretic peptide (BNP) 178.7 (*)    All other components within normal limits  LACTIC ACID, PLASMA - Abnormal; Notable for the following:    Lactic Acid, Venous 3.1 (*)    All other components within normal limits  URINALYSIS, ROUTINE W REFLEX MICROSCOPIC - Abnormal; Notable for the following:    Protein, ur 100 (*)    All other components within normal limits  TROPONIN I  URINE MICROSCOPIC-ADD ON  CULTURE, BLOOD (ROUTINE X 2)  CULTURE, BLOOD (ROUTINE X 2)  URINE CULTURE   Dg Chest Portable 1 View  12/10/2011  *RADIOLOGY REPORT*  Clinical Data: Shortness of breath and weakness.  PORTABLE CHEST - 1 VIEW  Comparison: 10/04/2011  Findings: 2128 hours.  The patient is rotated to the left.  Lung volumes are low. The cardiopericardial silhouette is enlarged. Vascular congestion with diffuse interstitial and basilar alveolar opacities suggest edema.  The patient is status post CABG. Telemetry leads overlie the chest.  IMPRESSION: Low volume film with cardiomegaly and pulmonary edema.  Original Report Authenticated By: ERIC A. MANSELL, M.D.     1. Fever   2. Hypoxia   3. Leukocytosis   4. Cough   5. Healthcare-associated pneumonia       MDM   Patient presents from home with reported onset of shortness of breath today. Per his history he began getting sick to stomach and subsequently throughout. Per EMS history they report family saw him w significant evidence of dyspnea. On EMS arrival he was reportedly 50% saturation on  baseline 3 L nasal cannula. This improved number preterm route. Also had a breathing treatment en route. Here patient speaking in full sentences. He reports he feels better after throwing up. There is significant lower extremity edema but patient reports at baseline. He denies any chest pain. He has had some cough but no productive sputum. He does report subjective chills. When asked about fevers at home he reports at temperature of 150. He confirmed his multiple questioning.  Patient febrile here to 104.   Chest x-ray read as pulmonary edema, however given patient's hypoxia, his fever here, his cough, and his elevated white count of 17,000,  there is high concern for pneumonia. Hospital-acquired coverage given. Also noted that his BNP is lower than most previous and no increase in LE edema.    Further history from the family reports that patient tonight began with shaking chills and rigors. He is having frequent cough at home. There has been no noted rash. Oxygen successfully weaned to 4 L nasal cannula patient is a good sat of 96-98%. Blood pressure stable though given the mild lactic acidosis gentle IV fluids given. He does have known history of heart failure with his most recent EF of 30%.  Dr. Mikeal Hawthorne will admit to the step down unit. He requests ABG  Donnamarie Poag, MD 12/10/11 2352

## 2011-12-10 NOTE — ED Notes (Signed)
Per EMS, pt became SOB starting est 30 minutes while laying down. Pt was given Albuterol treatment prior to EMS arrival. Pt had MI and PNA 3 weeks ago. Pt on home 3L O2 N/C. 20g L hand. CBG 252. Vitals: 180/120, 98, 22. Rhonch throughout lungs. Currently on non-rebreather 100%. Denies pain.

## 2011-12-11 ENCOUNTER — Inpatient Hospital Stay (HOSPITAL_COMMUNITY): Payer: Medicare Other

## 2011-12-11 ENCOUNTER — Encounter (HOSPITAL_COMMUNITY): Payer: Self-pay | Admitting: Internal Medicine

## 2011-12-11 DIAGNOSIS — J189 Pneumonia, unspecified organism: Secondary | ICD-10-CM

## 2011-12-11 DIAGNOSIS — E119 Type 2 diabetes mellitus without complications: Secondary | ICD-10-CM

## 2011-12-11 DIAGNOSIS — J9600 Acute respiratory failure, unspecified whether with hypoxia or hypercapnia: Secondary | ICD-10-CM

## 2011-12-11 DIAGNOSIS — Z66 Do not resuscitate: Secondary | ICD-10-CM | POA: Diagnosis present

## 2011-12-11 DIAGNOSIS — I10 Essential (primary) hypertension: Secondary | ICD-10-CM

## 2011-12-11 HISTORY — DX: Pneumonia, unspecified organism: J18.9

## 2011-12-11 LAB — POCT I-STAT 3, ART BLOOD GAS (G3+)
Acid-Base Excess: 2 mmol/L (ref 0.0–2.0)
O2 Saturation: 93 %
pCO2 arterial: 37.5 mmHg (ref 35.0–45.0)

## 2011-12-11 LAB — GLUCOSE, CAPILLARY
Glucose-Capillary: 145 mg/dL — ABNORMAL HIGH (ref 70–99)
Glucose-Capillary: 83 mg/dL (ref 70–99)
Glucose-Capillary: 101 mg/dL — ABNORMAL HIGH (ref 70–99)

## 2011-12-11 LAB — CBC
Hemoglobin: 12.2 g/dL — ABNORMAL LOW (ref 13.0–17.0)
RBC: 4.16 MIL/uL — ABNORMAL LOW (ref 4.22–5.81)
WBC: 17.3 10*3/uL — ABNORMAL HIGH (ref 4.0–10.5)

## 2011-12-11 LAB — COMPREHENSIVE METABOLIC PANEL
CO2: 25 mEq/L (ref 19–32)
Calcium: 8.4 mg/dL (ref 8.4–10.5)
Creatinine, Ser: 1.29 mg/dL (ref 0.50–1.35)
GFR calc Af Amer: 63 mL/min — ABNORMAL LOW (ref >90)
GFR calc non Af Amer: 55 mL/min — ABNORMAL LOW (ref >90)
Glucose, Bld: 183 mg/dL — ABNORMAL HIGH (ref 70–99)
Total Protein: 6.1 g/dL (ref 6.0–8.3)

## 2011-12-11 LAB — HEMOGLOBIN A1C
Hgb A1c MFr Bld: 7.2 % — ABNORMAL HIGH (ref <5.7)
Mean Plasma Glucose: 160 mg/dL — ABNORMAL HIGH (ref <117)

## 2011-12-11 LAB — DIFFERENTIAL
Basophils Relative: 0 % (ref 0–1)
Lymphocytes Relative: 6 % — ABNORMAL LOW (ref 12–46)
Lymphs Abs: 1.1 10*3/uL (ref 0.7–4.0)
Monocytes Relative: 4 % (ref 3–12)
Neutro Abs: 15.4 10*3/uL — ABNORMAL HIGH (ref 1.7–7.7)
Neutrophils Relative %: 89 % — ABNORMAL HIGH (ref 43–77)

## 2011-12-11 LAB — MRSA PCR SCREENING: MRSA by PCR: NEGATIVE

## 2011-12-11 MED ORDER — ALPRAZOLAM 0.5 MG PO TABS
0.5000 mg | ORAL_TABLET | ORAL | Status: DC
  Administered 2011-12-11 – 2011-12-12 (×4): 0.5 mg via ORAL
  Filled 2011-12-11 (×3): qty 1

## 2011-12-11 MED ORDER — INSULIN GLARGINE 100 UNIT/ML ~~LOC~~ SOLN: 5.0000 [IU] | Freq: Two times a day (BID) | SUBCUTANEOUS | Status: DC | PRN

## 2011-12-11 MED ORDER — METOLAZONE 5 MG PO TABS
5.0000 mg | ORAL_TABLET | ORAL | Status: DC
  Administered 2011-12-11: 5 mg via ORAL
  Filled 2011-12-11: qty 1

## 2011-12-11 MED ORDER — VANCOMYCIN HCL 1000 MG IV SOLR
1250.0000 mg | Freq: Two times a day (BID) | INTRAVENOUS | Status: DC
  Administered 2011-12-11: 1250 mg via INTRAVENOUS
  Filled 2011-12-11 (×2): qty 1250

## 2011-12-11 MED ORDER — BRIMONIDINE TARTRATE 0.2 % OP SOLN
1.0000 [drp] | Freq: Two times a day (BID) | OPHTHALMIC | Status: DC
  Administered 2011-12-11 – 2011-12-12 (×4): 1 [drp] via OPHTHALMIC
  Filled 2011-12-11: qty 5

## 2011-12-11 MED ORDER — HYDROCODONE-ACETAMINOPHEN 5-500 MG PO TABS: 1.5000 | ORAL_TABLET | Freq: Four times a day (QID) | ORAL | Status: DC | PRN

## 2011-12-11 MED ORDER — BIOTENE DRY MOUTH MT LIQD
15.0000 mL | Freq: Two times a day (BID) | OROMUCOSAL | Status: DC
  Administered 2011-12-11 – 2011-12-12 (×3): 15 mL via OROMUCOSAL

## 2011-12-11 MED ORDER — POTASSIUM CHLORIDE CRYS ER 20 MEQ PO TBCR
60.0000 meq | EXTENDED_RELEASE_TABLET | Freq: Three times a day (TID) | ORAL | Status: DC
  Administered 2011-12-11 – 2011-12-12 (×5): 60 meq via ORAL
  Filled 2011-12-11 (×7): qty 3

## 2011-12-11 MED ORDER — DICLOFENAC SODIUM 1 % TD GEL: 1.0000 "application " | Freq: Four times a day (QID) | TRANSDERMAL | Status: DC | PRN

## 2011-12-11 MED ORDER — ENOXAPARIN SODIUM 40 MG/0.4ML ~~LOC~~ SOLN
40.0000 mg | SUBCUTANEOUS | Status: DC
  Administered 2011-12-11 – 2011-12-12 (×2): 40 mg via SUBCUTANEOUS
  Filled 2011-12-11 (×2): qty 0.4

## 2011-12-11 MED ORDER — GLIPIZIDE 5 MG PO TABS
5.0000 mg | ORAL_TABLET | Freq: Two times a day (BID) | ORAL | Status: DC
  Administered 2011-12-11 – 2011-12-12 (×4): 5 mg via ORAL
  Filled 2011-12-11 (×5): qty 1

## 2011-12-11 MED ORDER — SODIUM CHLORIDE 0.9 % IV SOLN
INTRAVENOUS | Status: DC
  Administered 2011-12-11: 16:00:00 via INTRAVENOUS
  Administered 2011-12-11: 50 mL/h via INTRAVENOUS

## 2011-12-11 MED ORDER — DICLOFENAC SODIUM 1 % TD GEL
1.0000 "application " | Freq: Four times a day (QID) | TRANSDERMAL | Status: DC | PRN
  Filled 2011-12-11: qty 100

## 2011-12-11 MED ORDER — VANCOMYCIN HCL 1000 MG IV SOLR
1750.0000 mg | INTRAVENOUS | Status: DC
  Administered 2011-12-12: 1750 mg via INTRAVENOUS
  Filled 2011-12-11 (×2): qty 1750

## 2011-12-11 MED ORDER — BRIMONIDINE TARTRATE-TIMOLOL 0.2-0.5 % OP SOLN: 1.0000 [drp] | Freq: Two times a day (BID) | OPHTHALMIC | Status: DC

## 2011-12-11 MED ORDER — TERAZOSIN HCL 5 MG PO CAPS
5.0000 mg | ORAL_CAPSULE | Freq: Every day | ORAL | Status: DC
  Administered 2011-12-11: 5 mg via ORAL
  Filled 2011-12-11 (×2): qty 1

## 2011-12-11 MED ORDER — PANTOPRAZOLE SODIUM 40 MG PO TBEC
40.0000 mg | DELAYED_RELEASE_TABLET | Freq: Every day | ORAL | Status: DC
  Administered 2011-12-11 – 2011-12-12 (×2): 40 mg via ORAL
  Filled 2011-12-11 (×2): qty 1

## 2011-12-11 MED ORDER — OLOPATADINE HCL 0.1 % OP SOLN
1.0000 [drp] | Freq: Two times a day (BID) | OPHTHALMIC | Status: DC
  Administered 2011-12-11 – 2011-12-12 (×3): 1 [drp] via OPHTHALMIC
  Filled 2011-12-11: qty 5

## 2011-12-11 MED ORDER — HYDROCODONE-ACETAMINOPHEN 5-325 MG PO TABS
1.5000 | ORAL_TABLET | Freq: Four times a day (QID) | ORAL | Status: DC | PRN
  Administered 2011-12-11 (×2): 1.5 via ORAL
  Filled 2011-12-11 (×2): qty 2

## 2011-12-11 MED ORDER — FUROSEMIDE 80 MG PO TABS
160.0000 mg | ORAL_TABLET | Freq: Every day | ORAL | Status: DC
  Administered 2011-12-11 – 2011-12-12 (×2): 160 mg via ORAL
  Filled 2011-12-11 (×2): qty 2

## 2011-12-11 MED ORDER — ALPRAZOLAM 0.5 MG PO TABS
2.0000 mg | ORAL_TABLET | Freq: Every day | ORAL | Status: DC
  Administered 2011-12-11: 2 mg via ORAL
  Filled 2011-12-11 (×2): qty 4

## 2011-12-11 MED ORDER — LATANOPROST 0.005 % OP SOLN
1.0000 [drp] | Freq: Every day | OPHTHALMIC | Status: DC
  Administered 2011-12-11: 1 [drp] via OPHTHALMIC
  Filled 2011-12-11: qty 2.5

## 2011-12-11 MED ORDER — ISOSORBIDE MONONITRATE ER 30 MG PO TB24
30.0000 mg | ORAL_TABLET | Freq: Every day | ORAL | Status: DC
  Administered 2011-12-11 – 2011-12-12 (×2): 30 mg via ORAL
  Filled 2011-12-11 (×2): qty 1

## 2011-12-11 MED ORDER — DEXTROSE 5 % IV SOLN: 1.0000 g | INTRAVENOUS | Status: DC

## 2011-12-11 MED ORDER — ATENOLOL 50 MG PO TABS
50.0000 mg | ORAL_TABLET | Freq: Two times a day (BID) | ORAL | Status: DC
  Administered 2011-12-11 – 2011-12-12 (×4): 50 mg via ORAL
  Filled 2011-12-11 (×5): qty 1

## 2011-12-11 MED ORDER — DEXTROSE 5 % IV SOLN: 500.0000 mg | INTRAVENOUS | Status: DC

## 2011-12-11 MED ORDER — ALLOPURINOL 300 MG PO TABS
300.0000 mg | ORAL_TABLET | Freq: Every day | ORAL | Status: DC
  Administered 2011-12-11 – 2011-12-12 (×2): 300 mg via ORAL
  Filled 2011-12-11 (×2): qty 1

## 2011-12-11 MED ORDER — INSULIN ASPART 100 UNIT/ML ~~LOC~~ SOLN
0.0000 [IU] | Freq: Three times a day (TID) | SUBCUTANEOUS | Status: DC
  Administered 2011-12-12: 7 [IU] via SUBCUTANEOUS
  Administered 2011-12-12: 4 [IU] via SUBCUTANEOUS

## 2011-12-11 MED ORDER — PERPHENAZINE 8 MG PO TABS
8.0000 mg | ORAL_TABLET | Freq: Every day | ORAL | Status: DC
  Administered 2011-12-11: 8 mg via ORAL
  Filled 2011-12-11 (×2): qty 1

## 2011-12-11 MED ORDER — CLOPIDOGREL BISULFATE 75 MG PO TABS
75.0000 mg | ORAL_TABLET | Freq: Every day | ORAL | Status: DC
  Administered 2011-12-11 – 2011-12-12 (×2): 75 mg via ORAL
  Filled 2011-12-11 (×2): qty 1

## 2011-12-11 MED ORDER — DEXTROSE 5 % IV SOLN
1.0000 g | Freq: Three times a day (TID) | INTRAVENOUS | Status: DC
  Administered 2011-12-11: 1 g via INTRAVENOUS
  Filled 2011-12-11 (×4): qty 1

## 2011-12-11 MED ORDER — TIMOLOL MALEATE 0.5 % OP SOLN
1.0000 [drp] | Freq: Two times a day (BID) | OPHTHALMIC | Status: DC
  Administered 2011-12-11 – 2011-12-12 (×4): 1 [drp] via OPHTHALMIC
  Filled 2011-12-11: qty 5

## 2011-12-11 MED ORDER — CIPROFLOXACIN-DEXAMETHASONE 0.3-0.1 % OT SUSP
4.0000 [drp] | Freq: Two times a day (BID) | OTIC | Status: DC
  Administered 2011-12-11 – 2011-12-12 (×4): 4 [drp] via OTIC
  Filled 2011-12-11: qty 7.5

## 2011-12-11 MED ORDER — LEVOFLOXACIN IN D5W 750 MG/150ML IV SOLN
750.0000 mg | INTRAVENOUS | Status: DC
  Administered 2011-12-11 – 2011-12-12 (×2): 750 mg via INTRAVENOUS
  Filled 2011-12-11 (×3): qty 150

## 2011-12-11 MED ORDER — SIMVASTATIN 20 MG PO TABS
20.0000 mg | ORAL_TABLET | Freq: Every evening | ORAL | Status: DC
  Administered 2011-12-11 – 2011-12-12 (×2): 20 mg via ORAL
  Filled 2011-12-11 (×2): qty 1

## 2011-12-11 MED ORDER — TEMAZEPAM 15 MG PO CAPS
30.0000 mg | ORAL_CAPSULE | Freq: Every day | ORAL | Status: DC
  Administered 2011-12-11: 30 mg via ORAL
  Filled 2011-12-11: qty 2

## 2011-12-11 MED ORDER — NITROGLYCERIN 0.4 MG SL SUBL: 0.4000 mg | SUBLINGUAL_TABLET | SUBLINGUAL | Status: DC | PRN

## 2011-12-11 NOTE — Progress Notes (Signed)
ANTIBIOTIC CONSULT NOTE - INITIAL  Pharmacy Consult for vancomycin  Indication: HAP  No Known Allergies  Patient Measurements: Height: 5\' 10"  (177.8 cm) Weight: 276 lb 14.4 oz (125.6 kg) IBW/kg (Calculated) : 73  Adjusted Body Weight:   Vital Signs: Temp: 100.5 F (38.1 C) (05/08 0126) Temp src: Oral (05/08 0126) BP: 125/88 mmHg (05/08 0145) Pulse Rate: 85  (05/08 0145) Intake/Output from previous day:   Intake/Output from this shift:    Labs:  Basename 12/10/11 2140  WBC 17.0*  HGB 14.6  PLT 222  LABCREA --  CREATININE 1.23   Estimated Creatinine Clearance: 74.3 ml/min (by C-G formula based on Cr of 1.23). No results found for this basename: VANCOTROUGH:2,VANCOPEAK:2,VANCORANDOM:2,GENTTROUGH:2,GENTPEAK:2,GENTRANDOM:2,TOBRATROUGH:2,TOBRAPEAK:2,TOBRARND:2,AMIKACINPEAK:2,AMIKACINTROU:2,AMIKACIN:2, in the last 72 hours   Microbiology: No results found for this or any previous visit (from the past 720 hour(s)).  Medical History: Past Medical History  Diagnosis Date  . CHF (congestive heart failure)   . Hypertension   . CVA (cerebral infarction)   . Epilepsy   . Peripheral vascular disease   . Ulcer     left foot  . Cellulitis   . Chronic kidney disease   . Dyslipidemia   . Chronic venous insufficiency   . Sleep apnea   . Morbid obesity   . Diverticulosis   . Benign prostatic hypertrophy   . Glaucoma   . Staphylococcus aureus infection   . Gout   . Depression   . Insomnia   . Coronary artery disease   . CAD (coronary artery disease), native coronary artery   . GERD (gastroesophageal reflux disease)   . Heart murmur   . Myocardial infarction ~ 11/2011    "in ICU @ [facility]; stress-related"  . Angina   . Pneumonia 12/11/11  . Shortness of breath 12/11/11    "mostly w/exertion"  . Type II diabetes mellitus   . Chronic daily headache     "~ all the time"  . Seizures     hx; "don't know from what"  . Stroke ~ 1983    "little weaker right hand"  .  Arthritis     Medications:  Prescriptions prior to admission  Medication Sig Dispense Refill  . allopurinol (ZYLOPRIM) 300 MG tablet Take 300 mg by mouth daily.        Marland Kitchen ALPRAZolam (XANAX) 1 MG tablet Take 0.5-2 mg by mouth 3 (three) times daily. Takes half tablet in the morning and at 430 pm and 2 tablets at bedtime.      Marland Kitchen atenolol (TENORMIN) 50 MG tablet Take 50 mg by mouth 2 (two) times daily.       . brimonidine-timolol (COMBIGAN) 0.2-0.5 % ophthalmic solution Place 1 drop into both eyes every 12 (twelve) hours.      . ciprofloxacin-dexamethasone (CIPRODEX) otic suspension Place 4 drops into the right ear 2 (two) times daily. Hole in ear drum      . clopidogrel (PLAVIX) 75 MG tablet Take 75 mg by mouth daily.        . diclofenac sodium (VOLTAREN) 1 % GEL Apply 1 application topically 4 (four) times daily as needed. Applies to both knees as needed for pain      . furosemide (LASIX) 80 MG tablet Take 160 mg by mouth daily.       Marland Kitchen glipiZIDE (GLUCOTROL) 5 MG tablet Take 5 mg by mouth 2 (two) times daily before a meal.        . HYDROcodone-acetaminophen (VICODIN ES) 7.5-750 MG per tablet Take  1 tablet by mouth every 6 (six) hours as needed. For pain      . insulin glargine (LANTUS) 100 UNIT/ML injection Inject 5-10 Units into the skin 2 (two) times daily as needed. Per sliding scale in divided doses. May use up to 140 units/day. Usual dose is 5-10 units      . isosorbide mononitrate (IMDUR) 30 MG 24 hr tablet Take 30 mg by mouth daily.      Marland Kitchen latanoprost (XALATAN) 0.005 % ophthalmic solution Place 1 drop into both eyes at bedtime.        . metolazone (ZAROXOLYN) 5 MG tablet Take 5 mg by mouth every Monday, Wednesday, and Friday.       Marland Kitchen olopatadine (PATANOL) 0.1 % ophthalmic solution Place 1 drop into both eyes 2 (two) times daily.        Marland Kitchen omeprazole (PRILOSEC) 20 MG capsule Take 20 mg by mouth 2 (two) times daily. For heart burn      . perphenazine (TRILAFON) 8 MG tablet Take 8 mg by mouth  at bedtime.      . potassium chloride SA (K-DUR,KLOR-CON) 20 MEQ tablet Take 60 mEq by mouth 3 (three) times daily. Takes 3 tablets for each dose      . simvastatin (ZOCOR) 20 MG tablet Take 20 mg by mouth every evening.       . temazepam (RESTORIL) 30 MG capsule Take 30 mg by mouth at bedtime.       Marland Kitchen terazosin (HYTRIN) 5 MG capsule Take 5 mg by mouth at bedtime.      . nitroGLYCERIN (NITROSTAT) 0.4 MG SL tablet Place 0.4 mg under the tongue every 5 (five) minutes as needed. For chest pain       Assessment:  71 yo diabetic male with HAP. Vanc cefepime and levaquin for empiric coverage. Vancomycin 2 gm give in ED.   Goal of Therapy:  Vancomycin trough level 15-20 mcg/ml  Plan:  Vancomycin 1250 mg q12h adjusted for body weight and crcl  dc'ed rocephin and azithromycin which are for CAP coverage   Janice Coffin 12/11/2011,1:56 AM

## 2011-12-11 NOTE — Progress Notes (Signed)
Utilization review completed. Sadie Pickar, RN, BSN. 12/11/11  

## 2011-12-11 NOTE — ED Notes (Signed)
Transported by Paula RN 

## 2011-12-11 NOTE — H&P (Signed)
Nicholas [name] is an 71 y.o. male.   Chief Complaint: Fever and shortness of breath HPI: A 71 year old gentleman with multiple medical problems who was last admitted to [facility] long with respiratory failure necessitating intubation. Patient was brought in by his wife today secondary to shortness of breath and fever. He was found to have a temperature of 104 in the emergency room. He's had cough associated with the shortness of breath which is productive of white sputum. He denied any chest pain or hemoptysis. Patient had an admission last month which necessitated intubation the he's not happy with. He wants to be a DO NOT RESUSCITATE. He denied any known sick contacts. He however has been in the hospital multiple times and was admitted within the last 2 months.  Past Medical History  Diagnosis Date  . CHF (congestive heart failure)   . Hypertension   . CVA (cerebral infarction)   . Epilepsy   . Peripheral vascular disease   . Ulcer     left foot  . Cellulitis   . Chronic kidney disease   . Dyslipidemia   . Chronic venous insufficiency   . Sleep apnea   . Morbid obesity   . Diverticulosis   . Benign prostatic hypertrophy   . Glaucoma   . Staphylococcus aureus infection   . Gout   . Depression   . Insomnia   . Coronary artery disease   . CAD (coronary artery disease), native coronary artery   . GERD (gastroesophageal reflux disease)   . Heart murmur   . Myocardial infarction ~ 11/2011    "in ICU @ [facility]; stress-related"  . Angina   . Pneumonia 12/11/11  . Shortness of breath 12/11/11    "mostly w/exertion"  . Type II diabetes mellitus   . Chronic daily headache     "~ all the time"  . Seizures     hx; "don't know from what"  . Stroke ~ 1983    "little weaker right hand"  . Arthritis     Past Surgical History  Procedure Date  . Appendectomy   . Lumbar laminectomy   . Mastoid debridement   . Knee and ankle surgery 1979    "caved in in a ditch; messed up legs"; right    . Cholecystectomy   . Back surgery     "5 or 6"  . Eye surgery     "laser; both eyes"  . Peripheral arterial stent graft     "don't remember which side"  . Coronary artery bypass graft 1983; 1992    "CABG X 5; CABG X 8"    Family History  Problem Relation Age of Onset  . Hypertension Other   . Cancer Other    Social History:  reports that he quit smoking about 20 years ago. His smoking use included Cigarettes. He has a 20 pack-year smoking history. He quit smokeless tobacco use about 24 years ago. His smokeless tobacco use included Chew. He reports that he does not drink alcohol or use illicit drugs.  Allergies: No Known Allergies  Medications Prior to Admission  Medication Sig Dispense Refill  . allopurinol (ZYLOPRIM) 300 MG tablet Take 300 mg by mouth daily.        Marland Kitchen ALPRAZolam (XANAX) 1 MG tablet Take 0.5-2 mg by mouth 3 (three) times daily. Takes half tablet in the morning and at 430 pm and 2 tablets at bedtime.      Marland Kitchen atenolol (TENORMIN) 50 MG tablet Take 50  mg by mouth 2 (two) times daily.       . brimonidine-timolol (COMBIGAN) 0.2-0.5 % ophthalmic solution Place 1 drop into both eyes every 12 (twelve) hours.      . ciprofloxacin-dexamethasone (CIPRODEX) otic suspension Place 4 drops into the right ear 2 (two) times daily. Hole in ear drum      . clopidogrel (PLAVIX) 75 MG tablet Take 75 mg by mouth daily.        . diclofenac sodium (VOLTAREN) 1 % GEL Apply 1 application topically 4 (four) times daily as needed. Applies to both knees as needed for pain      . furosemide (LASIX) 80 MG tablet Take 160 mg by mouth daily.       Marland Kitchen glipiZIDE (GLUCOTROL) 5 MG tablet Take 5 mg by mouth 2 (two) times daily before a meal.        . HYDROcodone-acetaminophen (VICODIN ES) 7.5-750 MG per tablet Take 1 tablet by mouth every 6 (six) hours as needed. For pain      . insulin glargine (LANTUS) 100 UNIT/ML injection Inject 5-10 Units into the skin 2 (two) times daily as needed. Per sliding scale  in divided doses. May use up to 140 units/day. Usual dose is 5-10 units      . isosorbide mononitrate (IMDUR) 30 MG 24 hr tablet Take 30 mg by mouth daily.      Marland Kitchen latanoprost (XALATAN) 0.005 % ophthalmic solution Place 1 drop into both eyes at bedtime.        . metolazone (ZAROXOLYN) 5 MG tablet Take 5 mg by mouth every Monday, Wednesday, and Friday.       Marland Kitchen olopatadine (PATANOL) 0.1 % ophthalmic solution Place 1 drop into both eyes 2 (two) times daily.        Marland Kitchen omeprazole (PRILOSEC) 20 MG capsule Take 20 mg by mouth 2 (two) times daily. For heart burn      . perphenazine (TRILAFON) 8 MG tablet Take 8 mg by mouth at bedtime.      . potassium chloride SA (K-DUR,KLOR-CON) 20 MEQ tablet Take 60 mEq by mouth 3 (three) times daily. Takes 3 tablets for each dose      . simvastatin (ZOCOR) 20 MG tablet Take 20 mg by mouth every evening.       . temazepam (RESTORIL) 30 MG capsule Take 30 mg by mouth at bedtime.       Marland Kitchen terazosin (HYTRIN) 5 MG capsule Take 5 mg by mouth at bedtime.      . nitroGLYCERIN (NITROSTAT) 0.4 MG SL tablet Place 0.4 mg under the tongue every 5 (five) minutes as needed. For chest pain        Results for orders placed during the hospital encounter of 12/10/11 (from the past 48 hour(s))  CBC     Status: Abnormal   Collection Time   12/10/11  9:40 PM      Component Value Range Comment   WBC 17.0 (*) 4.0 - 10.5 (K/uL)    RBC 4.80  4.22 - 5.81 (MIL/uL)    Hemoglobin 14.6  13.0 - 17.0 (g/dL)    HCT 16.1  09.6 - 04.5 (%)    MCV 93.5  78.0 - 100.0 (fL)    MCH 30.4  26.0 - 34.0 (pg)    MCHC 32.5  30.0 - 36.0 (g/dL)    RDW 40.9  81.1 - 91.4 (%)    Platelets 222  150 - 400 (K/uL)   DIFFERENTIAL  Status: Abnormal   Collection Time   12/10/11  9:40 PM      Component Value Range Comment   Neutrophils Relative 83 (*) 43 - 77 (%)    Neutro Abs 14.2 (*) 1.7 - 7.7 (K/uL)    Lymphocytes Relative 9 (*) 12 - 46 (%)    Lymphs Abs 1.6  0.7 - 4.0 (K/uL)    Monocytes Relative 6  3 - 12 (%)     Monocytes Absolute 1.1 (*) 0.1 - 1.0 (K/uL)    Eosinophils Relative 1  0 - 5 (%)    Eosinophils Absolute 0.2  0.0 - 0.7 (K/uL)    Basophils Relative 0  0 - 1 (%)    Basophils Absolute 0.0  0.0 - 0.1 (K/uL)   COMPREHENSIVE METABOLIC PANEL     Status: Abnormal   Collection Time   12/10/11  9:40 PM      Component Value Range Comment   Sodium 142  135 - 145 (mEq/L)    Potassium 4.1  3.5 - 5.1 (mEq/L)    Chloride 102  96 - 112 (mEq/L)    CO2 28  19 - 32 (mEq/L)    Glucose, Bld 159 (*) 70 - 99 (mg/dL)    BUN 23  6 - 23 (mg/dL)    Creatinine, Ser 1.19  0.50 - 1.35 (mg/dL)    Calcium 9.5  8.4 - 10.5 (mg/dL)    Total Protein 7.6  6.0 - 8.3 (g/dL)    Albumin 3.6  3.5 - 5.2 (g/dL)    AST 10  0 - 37 (U/L)    ALT 7  0 - 53 (U/L)    Alkaline Phosphatase 112  39 - 117 (U/L)    Total Bilirubin 0.3  0.3 - 1.2 (mg/dL)    GFR calc non Af Amer 58 (*) >90 (mL/min)    GFR calc Af Amer 67 (*) >90 (mL/min)   PRO B NATRIURETIC PEPTIDE     Status: Abnormal   Collection Time   12/10/11  9:40 PM      Component Value Range Comment   Pro B Natriuretic peptide (BNP) 178.7 (*) 0 - 125 (pg/mL)   TROPONIN I     Status: Normal   Collection Time   12/10/11  9:40 PM      Component Value Range Comment   Troponin I <0.30  <0.30 (ng/mL)   LACTIC ACID, PLASMA     Status: Abnormal   Collection Time   12/10/11  9:40 PM      Component Value Range Comment   Lactic Acid, Venous 3.1 (*) 0.5 - 2.2 (mmol/L)   URINALYSIS, ROUTINE W REFLEX MICROSCOPIC     Status: Abnormal   Collection Time   12/10/11 10:39 PM      Component Value Range Comment   Color, Urine YELLOW  YELLOW     APPearance CLEAR  CLEAR     Specific Gravity, Urine 1.013  1.005 - 1.030     pH 5.5  5.0 - 8.0     Glucose, UA NEGATIVE  NEGATIVE (mg/dL)    Hgb urine dipstick NEGATIVE  NEGATIVE     Bilirubin Urine NEGATIVE  NEGATIVE     Ketones, ur NEGATIVE  NEGATIVE (mg/dL)    Protein, ur 147 (*) NEGATIVE (mg/dL)    Urobilinogen, UA 0.2  0.0 - 1.0 (mg/dL)     Nitrite NEGATIVE  NEGATIVE     Leukocytes, UA NEGATIVE  NEGATIVE    URINE MICROSCOPIC-ADD ON  Status: Normal   Collection Time   12/10/11 10:39 PM      Component Value Range Comment   Squamous Epithelial / LPF RARE  RARE     Bacteria, UA RARE  RARE     Urine-Other FEW YEAST     POCT I-STAT 3, BLOOD GAS (G3+)     Status: Abnormal   Collection Time   12/11/11 12:27 AM      Component Value Range Comment   pH, Arterial 7.441  7.350 - 7.450     pCO2 arterial 37.5  35.0 - 45.0 (mmHg)    pO2, Arterial 63.0 (*) 80.0 - 100.0 (mmHg)    Bicarbonate 25.5 (*) 20.0 - 24.0 (mEq/L)    TCO2 27  0 - 100 (mmol/L)    O2 Saturation 93.0      Acid-Base Excess 2.0  0.0 - 2.0 (mmol/L)    Collection site FEMORAL ARTERY      Sample type ARTERIAL     GLUCOSE, CAPILLARY     Status: Abnormal   Collection Time   12/11/11  1:25 AM      Component Value Range Comment   Glucose-Capillary 145 (*) 70 - 99 (mg/dL)    Comment 1 Documented in Chart      Comment 2 Notify RN      Dg Chest Portable 1 View  12/10/2011  *RADIOLOGY REPORT*  Clinical Data: Shortness of breath and weakness.  PORTABLE CHEST - 1 VIEW  Comparison: 10/04/2011  Findings: 2128 hours.  The patient is rotated to the left.  Lung volumes are low. The cardiopericardial silhouette is enlarged. Vascular congestion with diffuse interstitial and basilar alveolar opacities suggest edema.  The patient is status post CABG. Telemetry leads overlie the chest.  IMPRESSION: Low volume film with cardiomegaly and pulmonary edema.  Original Report Authenticated By: ERIC A. MANSELL, M.D.    Review of Systems  Constitutional: Positive for fever, chills and malaise/fatigue.  HENT: Negative.   Eyes: Negative.   Respiratory: Positive for cough, hemoptysis, sputum production and shortness of breath. Negative for wheezing.   Cardiovascular: Negative.   Gastrointestinal: Negative.   Genitourinary: Negative.   Musculoskeletal: Negative.   Skin: Negative.   Neurological:  Positive for weakness.  Endo/Heme/Allergies: Negative.   Psychiatric/Behavioral: Positive for depression. The patient is nervous/anxious.     Blood pressure 138/62, pulse 77, temperature 100.5 F (38.1 C), temperature source Oral, resp. rate 16, height 5\' 10"  (1.778 m), weight 125.6 kg (276 lb 14.4 oz), SpO2 99.00%. Physical Exam  Constitutional: He is oriented to person, place, and time. He appears well-developed and well-nourished.       obese  HENT:  Head: Normocephalic and atraumatic.  Right Ear: External ear normal.  Left Ear: External ear normal.  Nose: Nose normal.  Mouth/Throat: Oropharynx is clear and moist.  Eyes: Conjunctivae and EOM are normal. Pupils are equal, round, and reactive to light.  Neck: Normal range of motion. Neck supple.  Cardiovascular: Normal rate, regular rhythm, normal heart sounds and intact distal pulses.   Respiratory: He is in respiratory distress. He has wheezes. He has rales. He exhibits no tenderness.  GI: Soft. Bowel sounds are normal.  Musculoskeletal: He exhibits edema. He exhibits no tenderness.  Neurological: He is alert and oriented to person, place, and time. He has normal reflexes.  Skin: Skin is warm and dry.  Psychiatric: His speech is normal. Thought content normal. He is withdrawn. He exhibits a depressed mood.     Assessment/Plan Assessment this  is 72 year old gentleman presenting with what appears to be healthcare associated pneumonia. Patient does not want to be intubated if it comes to that. The father has been in and out of the hospital multiple times and was here within the last 2 months makes this a healthcare associated pneumonia even though he came from home. Plan #1 healthcare associated pneumonia: Admit the patient start IV antibiotics oxygen. If his respiratory status worsens we will put him on oxygen by non rebreather bag or CPAP but patient does not want to be intubated. #2 diabetes: Continue sliding scale insulin and his  home therapy. #3 hypertension: His blood pressure is controlled in the moment so we'll continue his home medications #4 morbid obesity: He will not need a bariatric bed in the hospital. Continue to monitor him on dietary counseling #5 coronary artery disease: No chest pain and he seemed very much stable. #6 depression/anxiety: Continue his home medications.  Siniyah Evangelist,LAWAL 12/11/2011, 4:40 AM

## 2011-12-11 NOTE — Consult Note (Signed)
WOC consult Note Reason for Consult: Pt followed by outpatient wound care center for chronic left foot full thickness wound.  He had an appligraft skin substitute applied on 5/6 and this must be left with dressing intact for 1 week to assure adherence to wound bed.  This type of topical treatment has a characteristic odor and yellow drainage.  Removed outer kerletx assess site.  Contact layer and 4X4 dressing left intact over site.  No increased odor or drainage noted.  Pt's left leg with generalized erythema and edema.  He states this was appearance prior to skin substitute application.  Redressed with kerlex over present dressing.  !.3X1.3cm halo of yellow drainage over site.   Plan:  Leave dressing intact until pt can resume follow-up with outpatient wound care center after discharge.  Will not plan to follow further unless re-consulted.  [address] RN, MSN, [employer]  [phone]

## 2011-12-11 NOTE — Plan of Care (Signed)
Transferred to 4708 with his wife. Pt. Tolerated transport well. Wife with [place] at all times. Transported on oxygen and monitor. No ectopy noted. Monitored by 4700 telemetry upon arrival to room.

## 2011-12-11 NOTE — Progress Notes (Signed)
Subjective: [name] is a very pleasant 71 year old male with multiple medical issues who presented with a very high fever early this a.m. to the emergency roomhe has a chronic left foot arch ulcer. That was debrided within the past 24-36 hours. He has had shaking chills/rigors, but currently is doing much better after antibiotic therapy has been started intravenously. There is a question of a pneumonia, but he has no significant pulmonary symptoms. The high fever, likely caused some temporary CHF. Currently oxygenating well and feels well. No productive cough. Suspect he may have seeded his blood stream with bacteria, because of the debridement  Objective: Weight change:   Intake/Output Summary (Last 24 hours) at 12/11/11 0729 Last data filed at 12/11/11 0600  Gross per 24 hour  Intake    250 ml  Output      0 ml  Net    250 ml   Filed Vitals:   12/11/11 0453 12/11/11 0500 12/11/11 0600 12/11/11 0700  BP:  77/55 123/52 123/44  Pulse:  77 76 70  Temp: 99.3 F (37.4 C)     TempSrc: Oral     Resp:  17 15 18   Height:      Weight:      SpO2:  98% 98% 99%   General Appearance: Alert, cooperative, no distress, appears stated age Head: Normocephalic, without obvious abnormality, atraumatic Neck: Supple, symmetrical Lungs: Clear to auscultation bilaterally, respirations unlabored Heart: Regular rate and rhythm, S1 and S2 normal, no murmur, rub or gallop Abdomen: Soft, non-tender, bowel sounds active all four quadrants, no masses, no organomegaly Extremities: some left leg erythema above the wrapped foot. Apparently, had a skin graft in place. Will have wound care assess the wound today. Keep wrapped for now. 2+ edema and the left leg and 1+ in the right leg. No cyanosis. Charcot joint involving the right knee, chronic Pulses: 1+ and symmetric all extremities Skin: Skin color, texture, turgor normal, no rashes or lesions Neuro: CNII-XII intact. Normal strength, sensation and reflexes  throughout   Lab Results:  Basename 12/11/11 0500 12/10/11 2140  NA 139 142  K 4.0 4.1  CL 103 102  CO2 25 28  GLUCOSE 183* 159*  BUN 21 23  CREATININE 1.29 1.23  CALCIUM 8.4 9.5  MG -- --  PHOS -- --    Basename 12/11/11 0500 12/10/11 2140  AST 8 10  ALT 5 7  ALKPHOS 91 112  BILITOT 0.3 0.3  PROT 6.1 7.6  ALBUMIN 2.8* 3.6   No results found for this basename: LIPASE:2,AMYLASE:2 in the last 72 hours  Basename 12/11/11 0500 12/10/11 2140  WBC 17.3* 17.0*  NEUTROABS 15.4* 14.2*  HGB 12.2* 14.6  HCT 38.5* 44.9  MCV 92.5 93.5  PLT 207 222    Basename 12/10/11 2140  CKTOTAL --  CKMB --  CKMBINDEX --  TROPONINI <0.30   No components found with this basename: POCBNP:3 No results found for this basename: DDIMER:2 in the last 72 hours No results found for this basename: HGBA1C:2 in the last 72 hours No results found for this basename: CHOL:2,HDL:2,LDLCALC:2,TRIG:2,CHOLHDL:2,LDLDIRECT:2 in the last 72 hours No results found for this basename: TSH,T4TOTAL,FREET3,T3FREE,THYROIDAB in the last 72 hours No results found for this basename: VITAMINB12:2,FOLATE:2,FERRITIN:2,TIBC:2,IRON:2,RETICCTPCT:2 in the last 72 hours  Studies/Results: Dg Chest Portable 1 View  12/10/2011  *RADIOLOGY REPORT*  Clinical Data: Shortness of breath and weakness.  PORTABLE CHEST - 1 VIEW  Comparison: 10/04/2011  Findings: 2128 hours.  The patient is rotated to the  left.  Lung volumes are low. The cardiopericardial silhouette is enlarged. Vascular congestion with diffuse interstitial and basilar alveolar opacities suggest edema.  The patient is status post CABG. Telemetry leads overlie the chest.  IMPRESSION: Low volume film with cardiomegaly and pulmonary edema.  Original Report Authenticated By: ERIC A. MANSELL, M.D.   Medications: Scheduled Meds:   . acetaminophen  1,000 mg Oral Once  . allopurinol  300 mg Oral Daily  . ALPRAZolam  0.5 mg Oral Custom  . ALPRAZolam  2 mg Oral QHS  . antiseptic  oral rinse  15 mL Mouth Rinse BID  . atenolol  50 mg Oral BID  . brimonidine  1 drop Both Eyes BID  . ceFEPime (MAXIPIME) IV  1 g Intravenous Q8H  . ciprofloxacin-dexamethasone  4 drop Right Ear BID  . clopidogrel  75 mg Oral Daily  . enoxaparin  40 mg Subcutaneous Q24H  . furosemide  160 mg Oral Daily  . glipiZIDE  5 mg Oral BID AC  . insulin aspart  0-20 Units Subcutaneous TID WC  . isosorbide mononitrate  30 mg Oral Daily  . latanoprost  1 drop Both Eyes QHS  . levofloxacin (LEVAQUIN) IV  750 mg Intravenous Q24H  . metolazone  5 mg Oral Q M,W,F  . olopatadine  1 drop Both Eyes BID  . pantoprazole  40 mg Oral Q1200  . perphenazine  8 mg Oral QHS  . piperacillin-tazobactam (ZOSYN)  IV  4.5 g Intravenous Once  . potassium chloride SA  60 mEq Oral TID  . simvastatin  20 mg Oral QPM  . sodium chloride  250 mL Intravenous Once  . temazepam  30 mg Oral QHS  . terazosin  5 mg Oral QHS  . timolol  1 drop Both Eyes BID  . vancomycin  1,250 mg Intravenous Q12H  . vancomycin  2,000 mg Intravenous Once  . DISCONTD: azithromycin  500 mg Intravenous Q24H  . DISCONTD: brimonidine-timolol  1 drop Both Eyes Q12H  . DISCONTD: cefTRIAXone (ROCEPHIN)  IV  1 g Intravenous Q24H   Continuous Infusions:   . sodium chloride 50 mL/hr (12/11/11 0130)   PRN Meds:.diclofenac sodium, HYDROcodone-acetaminophen, insulin glargine, nitroGLYCERIN, DISCONTD: diclofenac sodium, DISCONTD: HYDROcodone-acetaminophen  Assessment/Plan: Patient Active Problem List  Diagnoses Date Noted  . HCAP (healthcare-associated pneumonia) - probably CHF, and not pneumonia. CHF, likely secondary to high metabolic rate and stress from high fever. Symptomatically much improved. Will check chest x-ray tomorrow 12/11/2011  . Morbid obesity - chronic 12/11/2011  . Leukocytosis - likely secondary to infection. Suspect from cellulitis of the left lower extremity and not pneumonia 10/04/2011  . Altered mental status - patient oriented,  and back to baseline 10/04/2011  . CAD (coronary artery disease), native coronary artery - aware, no chest pains   . Diabetic foot ulcer - chronic and likely the source of current clinical deterioration with vascular seeding of bacteria  08/28/2011  . Cellulitis of left lower extremity - continue Levaquin and vancomycin and discontinue Maxipime 07/30/2011  . AKI (acute kidney injury) - check BMET in the a.m. 07/29/2011  . DM (diabetes mellitus) - continue usual medicines, and use sliding scale of NovoLog insulin with CBGs 07/29/2011  . PAD - aware - has had lower extremity revascularization 07/29/2011  . Anxiety - chronic, controlled 07/29/2011  . HLD (hyperlipidemia) 07/29/2011  . BPH (benign prostatic hyperplasia) 07/29/2011  . Hypertensive heart disease without CHF - has had CHF in the past, diastolic dysfunction Transfer to telemetry bed  No CODE BLUE/DNR      LOS: 1 day   Nicholas [name] 12/11/2011, 7:29 AM

## 2011-12-11 NOTE — ED Provider Notes (Signed)
I saw and evaluated the patient, reviewed the resident's note and I agree with the findings and plan and agree with their ECG interpretation. Patient with clinical pneumonia. Fever. Patient's lactic acid is elevated, his blood pressures been doing well. He'll be admitted to medicine.  [name]. Rubin Payor, MD 12/11/11 [phone]

## 2011-12-11 NOTE — Progress Notes (Signed)
Pt admitted to room 4708 placed on tele, VS wdl, and pt oriented to room.  Will carry out MD orders and continue to monitor.

## 2011-12-12 LAB — CBC
Hemoglobin: 12.1 g/dL — ABNORMAL LOW (ref 13.0–17.0)
MCH: 29.7 pg (ref 26.0–34.0)
Platelets: 167 10*3/uL (ref 150–400)
RBC: 4.07 MIL/uL — ABNORMAL LOW (ref 4.22–5.81)
WBC: 7.5 10*3/uL (ref 4.0–10.5)

## 2011-12-12 LAB — BASIC METABOLIC PANEL
BUN: 17 mg/dL (ref 6–23)
CO2: 26 mEq/L (ref 19–32)
Chloride: 102 mEq/L (ref 96–112)
GFR calc non Af Amer: 59 mL/min — ABNORMAL LOW (ref >90)
Glucose, Bld: 123 mg/dL — ABNORMAL HIGH (ref 70–99)
Potassium: 3.8 mEq/L (ref 3.5–5.1)
Sodium: 140 mEq/L (ref 135–145)

## 2011-12-12 LAB — PRO B NATRIURETIC PEPTIDE: Pro B Natriuretic peptide (BNP): 570 pg/mL — ABNORMAL HIGH (ref 0–125)

## 2011-12-12 LAB — URINE CULTURE: Colony Count: >=100000

## 2011-12-12 LAB — DIFFERENTIAL
Eosinophils Absolute: 0.2 10*3/uL (ref 0.0–0.7)
Lymphocytes Relative: 33 % (ref 12–46)
Lymphs Abs: 2.4 10*3/uL (ref 0.7–4.0)
Monocytes Relative: 9 % (ref 3–12)
Neutro Abs: 4.2 10*3/uL (ref 1.7–7.7)
Neutrophils Relative %: 56 % (ref 43–77)

## 2011-12-12 LAB — GLUCOSE, CAPILLARY: Glucose-Capillary: 126 mg/dL — ABNORMAL HIGH (ref 70–99)

## 2011-12-12 MED ORDER — METOLAZONE 5 MG PO TABS
5.0000 mg | ORAL_TABLET | Freq: Once | ORAL | Status: AC
  Administered 2011-12-12: 5 mg via ORAL
  Filled 2011-12-12: qty 1

## 2011-12-12 MED ORDER — GLUCERNA SHAKE PO LIQD
237.0000 mL | Freq: Two times a day (BID) | ORAL | Status: DC
  Administered 2011-12-12: 237 mL via ORAL

## 2011-12-12 MED ORDER — LEVOFLOXACIN 500 MG PO TABS: 500.0000 mg | ORAL_TABLET | Freq: Every day | ORAL | Status: AC

## 2011-12-12 NOTE — Progress Notes (Signed)
INITIAL ADULT NUTRITION ASSESSMENT Date: 12/12/2011   Time: 11:06 AM Reason for Assessment: Low Braden Score   ASSESSMENT: Male 71 y.o.  Dx: HCAP (healthcare-associated pneumonia)  Hx:  Past Medical History  Diagnosis Date  . CHF (congestive heart failure)   . Hypertension   . CVA (cerebral infarction)   . Epilepsy   . Peripheral vascular disease   . Ulcer     left foot  . Cellulitis   . Chronic kidney disease   . Dyslipidemia   . Chronic venous insufficiency   . Sleep apnea   . Morbid obesity   . Diverticulosis   . Benign prostatic hypertrophy   . Glaucoma   . Staphylococcus aureus infection   . Gout   . Depression   . Insomnia   . Coronary artery disease   . CAD (coronary artery disease), native coronary artery   . GERD (gastroesophageal reflux disease)   . Heart murmur   . Myocardial infarction ~ 11/2011    "in ICU @ [facility]; stress-related"  . Angina   . Pneumonia 12/11/11  . Shortness of breath 12/11/11    "mostly w/exertion"  . Type II diabetes mellitus   . Chronic daily headache     "~ all the time"  . Seizures     hx; "don't know from what"  . Stroke ~ 1983    "little weaker right hand"  . Arthritis     Related Meds:     . allopurinol  300 mg Oral Daily  . ALPRAZolam  0.5 mg Oral Custom  . ALPRAZolam  2 mg Oral QHS  . antiseptic oral rinse  15 mL Mouth Rinse BID  . atenolol  50 mg Oral BID  . brimonidine  1 drop Both Eyes BID  . ciprofloxacin-dexamethasone  4 drop Right Ear BID  . clopidogrel  75 mg Oral Daily  . enoxaparin  40 mg Subcutaneous Q24H  . furosemide  160 mg Oral Daily  . glipiZIDE  5 mg Oral BID AC  . insulin aspart  0-20 Units Subcutaneous TID WC  . isosorbide mononitrate  30 mg Oral Daily  . latanoprost  1 drop Both Eyes QHS  . levofloxacin (LEVAQUIN) IV  750 mg Intravenous Q24H  . metolazone  5 mg Oral Q M,W,F  . metolazone  5 mg Oral Once  . olopatadine  1 drop Both Eyes BID  . pantoprazole  40 mg Oral Q1200  .  perphenazine  8 mg Oral QHS  . potassium chloride SA  60 mEq Oral TID  . simvastatin  20 mg Oral QPM  . temazepam  30 mg Oral QHS  . terazosin  5 mg Oral QHS  . timolol  1 drop Both Eyes BID  . vancomycin  1,750 mg Intravenous Q24H     Ht: 5\' 10"  (177.8 cm)  Wt: 274 lb 0.5 oz (124.3 kg) (a scale)  Ideal Wt: 75.4 kg % Ideal Wt: 165%  Usual Wt: 297 lbs per pt report, unknown last time he weighed this  Wt Readings from Last 10 Encounters:  12/12/11 274 lb 0.5 oz (124.3 kg)  10/07/11 266 lb 12.1 oz (121 kg)  09/03/11 263 lb 14.3 oz (119.7 kg)  08/02/11 275 lb 2.2 oz (124.8 kg)  06/17/11 270 lb (122.471 kg)  04/29/11 270 lb (122.471 kg)  08/24/09 287 lb 1.6 oz (130.228 kg)    % Usual Wt: 92%  Body mass index is 39.32 kg/(m^2). Obesity  Food/Nutrition Related Hx: Pt  reports poor appetite for at least 6 months and intake of only 1 meal daily.   Labs:  CMP     Component Value Date/Time   NA 140 12/12/2011 0655   K 3.8 12/12/2011 0655   CL 102 12/12/2011 0655   CO2 26 12/12/2011 0655   GLUCOSE 123* 12/12/2011 0655   BUN 17 12/12/2011 0655   CREATININE 1.21 12/12/2011 0655   CALCIUM 8.8 12/12/2011 0655   PROT 6.1 12/11/2011 0500   ALBUMIN 2.8* 12/11/2011 0500   AST 8 12/11/2011 0500   ALT 5 12/11/2011 0500   ALKPHOS 91 12/11/2011 0500   BILITOT 0.3 12/11/2011 0500   GFRNONAA 59* 12/12/2011 0655   GFRAA 68* 12/12/2011 0655   Lab Results  Component Value Date   HGBA1C 7.2* 12/11/2011     Intake/Output Summary (Last 24 hours) at 12/12/11 1110 Last data filed at 12/12/11 0900  Gross per 24 hour  Intake   1780 ml  Output    802 ml  Net    978 ml     Diet Order: General  Supplements/Tube Feeding: none  IVF:    sodium chloride Last Rate: 50 mL/hr at 12/12/11 0000    Estimated Nutritional Needs:   Kcal: 2200-2400  Protein: 110-130 gm  Fluid: 2.2-2.4 L  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 Pt reports he has very little appetite and has only been eating  one meal a day. At that meal, dinner, he is not eating the amount he used to, thinks he is eating half. "Not going back for seconds." RD asked if pt would be willing to take a multivitamin, pt refused stated he already takes to many pills. Is willing to drink a Glucerna or 2 daily if he is not eating. Pt did eat breakfast this am, 100%. Pt reports weight loss but per weight hx, pt appears to have gain weight in the last few months. Pt does have 3+ pitting edema per RN flow sheet which may be masking weight loss. Pt is at increased risk for malnutrition.  NUTRITION DIAGNOSIS: -Increased nutrient needs (NI-5.1).  Status: Ongoing  RELATED TO: healing  AS EVIDENCE BY: stage IV pressure ulcer  MONITORING/EVALUATION(Goals): Goal: PO intake of meals and supplements will be adequate to promote wound healing Monitor: PO intake, weight, labs, I/O's, wounds  EDUCATION NEEDS: -No education needs identified at this time  INTERVENTION: 1. Add Glucerna BID 2. RD will continue to follow  Dietitian # [phone]  DOCUMENTATION CODES Per approved criteria  -Obesity Unspecified    Clarene Duke MARIE 12/12/2011, 11:06 AM

## 2011-12-12 NOTE — Progress Notes (Signed)
Pt d/c per w/c accompanied by family and nurse tech. Pt has all  Belongings. Marisa Cyphers RN

## 2011-12-12 NOTE — Progress Notes (Signed)
Pt had a 3.14 second pause. Pt asymptomatic. Pt states that he feels fine. BP: 114/50, HR: 58. MD on call notified. No new orders given. Will continue to monitor.

## 2011-12-12 NOTE — Consult Note (Signed)
WOC consult Note Reason for Consult:Previous consult performed for left leg wound.  Pt and wife did not mention sacrum wound at that time.  Re-consulted for sacrum wound at this time. Wound type: Chronic stage 4 wound.  Area surrounded by pink dry scar tissue, with open area 1X4X.2cm.  Pt states this "has not healed in 8 years, " and is followed by outpatient wound care center.  He can  Resume follow-up with wound care center after discharge. Pressure Ulcer POA: Yes: Wound bed: 90% red, 10% yellow, mod yellow drainage, no odor. Dressing procedure/placement/frequency: Aquacel to absorb drainage and provide antimicrobial benefits.  Foam dressing to protect from incontinence or friction/shear. Will not plan to follow further unless re-consulted.  [address] RN, MSN, [employer]  [phone]

## 2011-12-12 NOTE — Discharge Summary (Signed)
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 is a very pleasant 71 year old male with a history of a chronic left foot arch ulcer that is followed in the wound clinic. He had the wound debrided 2 days prior to admission and developed very high fevers and delirium with temperature greater than 104 with rigors and chills and [facility]ray initially showed congestive heart failure, but after intravenous Lasix and IV antibiotics. His clinical state stabilized very quickly. His left lower extremity cellulitis is much improved and he is due to go to the wound center early next week. We'll discharge home on Levaquin after treatment with Zosyn and vancomycin and Maxipime. All other medical problems while hospitalized were stable with prompt resolution of congestive heart failure with one dose of IV Lasix and treatment of febrile state  Consults: not applicable  Disposition: Discharge to home  Discharge Orders    Future Appointments: Provider: Department: Dept Phone: Center:   12/23/2011 11:00 AM [facility] [phone] [facility]   12/23/2011 11:30 AM [facility] [phone] [facility]   12/23/2011 12:00 PM Nada Libman, MD [facility] [phone] [facility]   01/06/2012 8:00 AM [facility] [phone] [facility]   06/22/2012 11:45 AM Nada Libman, MD [facility] [phone] [facility]     Future Orders Please Complete By Expires   Diet - low  sodium heart healthy      Increase activity slowly      Call MD for:  temperature >100.4      Call MD for:  persistant nausea and vomiting      Call MD for:  redness, tenderness, or signs of infection (pain, swelling, redness, odor or green/yellow discharge around incision site)      Call MD for:  difficulty breathing, headache or visual disturbances      Call MD for:  hives        Medication List  As of 12/12/2011  3:47 PM   TAKE these medications         allopurinol 300 MG tablet   Commonly known as: ZYLOPRIM   Take 300 mg by mouth daily.      ALPRAZolam 1 MG tablet   Commonly known as: XANAX   Take 0.5-2 mg by mouth 3 (three) times daily. Takes half tablet in the morning and at 430 pm and 2 tablets at bedtime.      atenolol 50 MG tablet   Commonly known as: TENORMIN   Take 50 mg by mouth 2 (two) times daily.      brimonidine-timolol 0.2-0.5 % ophthalmic solution   Commonly known as: COMBIGAN   Place 1 drop into both eyes every 12 (twelve) hours.      ciprofloxacin-dexamethasone otic suspension   Commonly known as: CIPRODEX   Place 4 drops into the right ear 2 (two) times daily. Hole in ear drum      clopidogrel 75 MG tablet   Commonly known as: PLAVIX   Take 75 mg by mouth daily.  diclofenac sodium 1 % Gel   Commonly known as: VOLTAREN   Apply 1 application topically 4 (four) times daily as needed. Applies to both knees as needed for pain      furosemide 80 MG tablet   Commonly known as: LASIX   Take 160 mg by mouth daily.      glipiZIDE 5 MG tablet   Commonly known as: GLUCOTROL   Take 5 mg by mouth 2 (two) times daily before a meal.      HYDROcodone-acetaminophen 7.5-750 MG per tablet   Commonly known as: VICODIN ES   Take 1 tablet by mouth every 6 (six) hours as needed. For pain      insulin glargine 100 UNIT/ML injection   Commonly known as: LANTUS   Inject 5-10 Units into the skin 2 (two) times daily as needed. Per sliding scale in divided doses.  May  use up to 140 units/day.  Usual dose is 5-10 units      isosorbide mononitrate 30 MG 24 hr tablet   Commonly known as: IMDUR   Take 30 mg by mouth daily.      latanoprost 0.005 % ophthalmic solution   Commonly known as: XALATAN   Place 1 drop into both eyes at bedtime.      levofloxacin 500 MG tablet   Commonly known as: LEVAQUIN   Take 1 tablet (500 mg total) by mouth daily.      metolazone 5 MG tablet   Commonly known as: ZAROXOLYN   Take 5 mg by mouth every Monday, Wednesday, and Friday.      nitroGLYCERIN 0.4 MG SL tablet   Commonly known as: NITROSTAT   Place 0.4 mg under the tongue every 5 (five) minutes as needed. For chest pain      olopatadine 0.1 % ophthalmic solution   Commonly known as: PATANOL   Place 1 drop into both eyes 2 (two) times daily.      omeprazole 20 MG capsule   Commonly known as: PRILOSEC   Take 20 mg by mouth 2 (two) times daily. For heart burn      perphenazine 8 MG tablet   Commonly known as: TRILAFON   Take 8 mg by mouth at bedtime.      potassium chloride SA 20 MEQ tablet   Commonly known as: K-DUR,KLOR-CON   Take 60 mEq by mouth 3 (three) times daily. Takes 3 tablets for each dose      simvastatin 20 MG tablet   Commonly known as: ZOCOR   Take 20 mg by mouth every evening.      temazepam 30 MG capsule   Commonly known as: RESTORIL   Take 30 mg by mouth at bedtime.      terazosin 5 MG capsule   Commonly known as: HYTRIN   Take 5 mg by mouth at bedtime.             Things to follow up in the outpatient setting: Observe for fever, confusion, and leg edema, or pain  Time coordinating discharge: 33 minutes  The results of significant diagnostics from this hospitalization (including imaging, microbiology, ancillary and laboratory) are listed below for reference.    Significant Diagnostic Studies: Dg Chest 2 View  12/11/2011  *RADIOLOGY REPORT*  Clinical Data: Shortness of breath.  Home oxygen therapy.  CHEST - 2 VIEW   Comparison: 12/10/2011  Findings: The patient is rotated to the right on today's exam, resulting in reduced diagnostic sensitivity and specificity. Prior CABG  noted with cardiomegaly and indistinct pulmonary vasculature favoring pulmonary venous hypertension and slight interstitial edema over atypical pneumonia.  Blunting the right lateral costophrenic angle noted, favoring a small right pleural effusion.  Mild thoracic spondylosis is observed.  IMPRESSION:  1.  Cardiomegaly with indistinct pulmonary vasculature and faint interstitial accentuation, favoring pulmonary venous hypertension and mild interstitial edema. 2.  Small right pleural effusion.  Original Report Authenticated By: Dellia Cloud, M.D.   Dg Chest Portable 1 View  12/10/2011  *RADIOLOGY REPORT*  Clinical Data: Shortness of breath and weakness.  PORTABLE CHEST - 1 VIEW  Comparison: 10/04/2011  Findings: 2128 hours.  The patient is rotated to the left.  Lung volumes are low. The cardiopericardial silhouette is enlarged. Vascular congestion with diffuse interstitial and basilar alveolar opacities suggest edema.  The patient is status post CABG. Telemetry leads overlie the chest.  IMPRESSION: Low volume film with cardiomegaly and pulmonary edema.  Original Report Authenticated By: ERIC A. MANSELL, M.D.    Microbiology: Recent Results (from the past 240 hour(s))  CULTURE, BLOOD (ROUTINE X 2)     Status: Normal (Preliminary result)   Collection Time   12/10/11  9:30 PM      Component Value Range Status Comment   Specimen Description BLOOD RIGHT FOREARM   Final    Special Requests BOTTLES DRAWN AEROBIC AND ANAEROBIC 5CC   Final    Culture  Setup Time [id number]   Final    Culture     Final    Value:        BLOOD CULTURE RECEIVED NO GROWTH TO DATE CULTURE WILL BE HELD FOR 5 DAYS BEFORE ISSUING A FINAL NEGATIVE REPORT   Report Status PENDING   Incomplete   CULTURE, BLOOD (ROUTINE X 2)     Status: Normal (Preliminary result)    Collection Time   12/10/11  9:40 PM      Component Value Range Status Comment   Specimen Description BLOOD ARM RIGHT   Final    Special Requests BOTTLES DRAWN AEROBIC AND ANAEROBIC 5CC   Final    Culture  Setup Time [id number]   Final    Culture     Final    Value:        BLOOD CULTURE RECEIVED NO GROWTH TO DATE CULTURE WILL BE HELD FOR 5 DAYS BEFORE ISSUING A FINAL NEGATIVE REPORT   Report Status PENDING   Incomplete   URINE CULTURE     Status: Normal (Preliminary result)   Collection Time   12/10/11 10:57 PM      Component Value Range Status Comment   Specimen Description URINE, RANDOM   Final    Special Requests NONE   Final    Culture  Setup Time [id number]   Final    Colony Count >=100,000 COLONIES/ML   Final    Culture PSEUDOMONAS AERUGINOSA   Final    Report Status PENDING   Incomplete   MRSA PCR SCREENING     Status: Normal   Collection Time   12/11/11  1:22 AM      Component Value Range Status Comment   MRSA by PCR NEGATIVE  NEGATIVE  Final      Labs: Results for orders placed during the hospital encounter of 12/10/11  CBC      Component Value Range   WBC 17.0 (*) 4.0 - 10.5 (K/uL)   RBC 4.80  4.22 - 5.81 (MIL/uL)   Hemoglobin 14.6  13.0 - 17.0 (g/dL)   HCT  44.9  39.0 - 52.0 (%)   MCV 93.5  78.0 - 100.0 (fL)   MCH 30.4  26.0 - 34.0 (pg)   MCHC 32.5  30.0 - 36.0 (g/dL)   RDW 16.1  09.6 - 04.5 (%)   Platelets 222  150 - 400 (K/uL)  DIFFERENTIAL      Component Value Range   Neutrophils Relative 83 (*) 43 - 77 (%)   Neutro Abs 14.2 (*) 1.7 - 7.7 (K/uL)   Lymphocytes Relative 9 (*) 12 - 46 (%)   Lymphs Abs 1.6  0.7 - 4.0 (K/uL)   Monocytes Relative 6  3 - 12 (%)   Monocytes Absolute 1.1 (*) 0.1 - 1.0 (K/uL)   Eosinophils Relative 1  0 - 5 (%)   Eosinophils Absolute 0.2  0.0 - 0.7 (K/uL)   Basophils Relative 0  0 - 1 (%)   Basophils Absolute 0.0  0.0 - 0.1 (K/uL)  COMPREHENSIVE METABOLIC PANEL      Component Value Range   Sodium 142  135 - 145 (mEq/L)    Potassium 4.1  3.5 - 5.1 (mEq/L)   Chloride 102  96 - 112 (mEq/L)   CO2 28  19 - 32 (mEq/L)   Glucose, Bld 159 (*) 70 - 99 (mg/dL)   BUN 23  6 - 23 (mg/dL)   Creatinine, Ser 4.09  0.50 - 1.35 (mg/dL)   Calcium 9.5  8.4 - 81.1 (mg/dL)   Total Protein 7.6  6.0 - 8.3 (g/dL)   Albumin 3.6  3.5 - 5.2 (g/dL)   AST 10  0 - 37 (U/L)   ALT 7  0 - 53 (U/L)   Alkaline Phosphatase 112  39 - 117 (U/L)   Total Bilirubin 0.3  0.3 - 1.2 (mg/dL)   GFR calc non Af Amer 58 (*) >90 (mL/min)   GFR calc Af Amer 67 (*) >90 (mL/min)  PRO B NATRIURETIC PEPTIDE      Component Value Range   Pro B Natriuretic peptide (BNP) 178.7 (*) 0 - 125 (pg/mL)  TROPONIN I      Component Value Range   Troponin I <0.30  <0.30 (ng/mL)  LACTIC ACID, PLASMA      Component Value Range   Lactic Acid, Venous 3.1 (*) 0.5 - 2.2 (mmol/L)  CULTURE, BLOOD (ROUTINE X 2)      Component Value Range   Specimen Description BLOOD RIGHT FOREARM     Special Requests BOTTLES DRAWN AEROBIC AND ANAEROBIC 5CC     Culture  Setup Time [id number]     Culture       Value:        BLOOD CULTURE RECEIVED NO GROWTH TO DATE CULTURE WILL BE HELD FOR 5 DAYS BEFORE ISSUING A FINAL NEGATIVE REPORT   Report Status PENDING    CULTURE, BLOOD (ROUTINE X 2)      Component Value Range   Specimen Description BLOOD ARM RIGHT     Special Requests BOTTLES DRAWN AEROBIC AND ANAEROBIC 5CC     Culture  Setup Time [id number]     Culture       Value:        BLOOD CULTURE RECEIVED NO GROWTH TO DATE CULTURE WILL BE HELD FOR 5 DAYS BEFORE ISSUING A FINAL NEGATIVE REPORT   Report Status PENDING    URINALYSIS, ROUTINE W REFLEX MICROSCOPIC      Component Value Range   Color, Urine YELLOW  YELLOW    APPearance CLEAR  CLEAR  Specific Gravity, Urine 1.013  1.005 - 1.030    pH 5.5  5.0 - 8.0    Glucose, UA NEGATIVE  NEGATIVE (mg/dL)   Hgb urine dipstick NEGATIVE  NEGATIVE    Bilirubin Urine NEGATIVE  NEGATIVE    Ketones, ur NEGATIVE  NEGATIVE (mg/dL)   Protein,  ur 161 (*) NEGATIVE (mg/dL)   Urobilinogen, UA 0.2  0.0 - 1.0 (mg/dL)   Nitrite NEGATIVE  NEGATIVE    Leukocytes, UA NEGATIVE  NEGATIVE   URINE CULTURE      Component Value Range   Specimen Description URINE, RANDOM     Special Requests NONE     Culture  Setup Time [id number]     Colony Count >=100,000 COLONIES/ML     Culture PSEUDOMONAS AERUGINOSA     Report Status PENDING    URINE MICROSCOPIC-ADD ON      Component Value Range   Squamous Epithelial / LPF RARE  RARE    Bacteria, UA RARE  RARE    Urine-Other FEW YEAST    HEMOGLOBIN A1C      Component Value Range   Hemoglobin A1C 7.2 (*) <5.7 (%)   Mean Plasma Glucose 160 (*) <117 (mg/dL)  POCT I-STAT 3, BLOOD GAS (G3+)      Component Value Range   pH, Arterial 7.441  7.350 - 7.450    pCO2 arterial 37.5  35.0 - 45.0 (mmHg)   pO2, Arterial 63.0 (*) 80.0 - 100.0 (mmHg)   Bicarbonate 25.5 (*) 20.0 - 24.0 (mEq/L)   TCO2 27  0 - 100 (mmol/L)   O2 Saturation 93.0     Acid-Base Excess 2.0  0.0 - 2.0 (mmol/L)   Collection site FEMORAL ARTERY     Sample type ARTERIAL    MRSA PCR SCREENING      Component Value Range   MRSA by PCR NEGATIVE  NEGATIVE   GLUCOSE, CAPILLARY      Component Value Range   Glucose-Capillary 145 (*) 70 - 99 (mg/dL)   Comment 1 Documented in Chart     Comment 2 Notify RN    COMPREHENSIVE METABOLIC PANEL      Component Value Range   Sodium 139  135 - 145 (mEq/L)   Potassium 4.0  3.5 - 5.1 (mEq/L)   Chloride 103  96 - 112 (mEq/L)   CO2 25  19 - 32 (mEq/L)   Glucose, Bld 183 (*) 70 - 99 (mg/dL)   BUN 21  6 - 23 (mg/dL)   Creatinine, Ser 9.14  0.50 - 1.35 (mg/dL)   Calcium 8.4  8.4 - 78.2 (mg/dL)   Total Protein 6.1  6.0 - 8.3 (g/dL)   Albumin 2.8 (*) 3.5 - 5.2 (g/dL)   AST 8  0 - 37 (U/L)   ALT 5  0 - 53 (U/L)   Alkaline Phosphatase 91  39 - 117 (U/L)   Total Bilirubin 0.3  0.3 - 1.2 (mg/dL)   GFR calc non Af Amer 55 (*) >90 (mL/min)   GFR calc Af Amer 63 (*) >90 (mL/min)  CBC      Component Value  Range   WBC 17.3 (*) 4.0 - 10.5 (K/uL)   RBC 4.16 (*) 4.22 - 5.81 (MIL/uL)   Hemoglobin 12.2 (*) 13.0 - 17.0 (g/dL)   HCT 95.6 (*) 21.3 - 52.0 (%)   MCV 92.5  78.0 - 100.0 (fL)   MCH 29.3  26.0 - 34.0 (pg)   MCHC 31.7  30.0 - 36.0 (g/dL)  RDW 15.2  11.5 - 15.5 (%)   Platelets 207  150 - 400 (K/uL)  DIFFERENTIAL      Component Value Range   Neutrophils Relative 89 (*) 43 - 77 (%)   Neutro Abs 15.4 (*) 1.7 - 7.7 (K/uL)   Lymphocytes Relative 6 (*) 12 - 46 (%)   Lymphs Abs 1.1  0.7 - 4.0 (K/uL)   Monocytes Relative 4  3 - 12 (%)   Monocytes Absolute 0.8  0.1 - 1.0 (K/uL)   Eosinophils Relative 0  0 - 5 (%)   Eosinophils Absolute 0.0  0.0 - 0.7 (K/uL)   Basophils Relative 0  0 - 1 (%)   Basophils Absolute 0.0  0.0 - 0.1 (K/uL)  GLUCOSE, CAPILLARY      Component Value Range   Glucose-Capillary 88  70 - 99 (mg/dL)  GLUCOSE, CAPILLARY      Component Value Range   Glucose-Capillary 83  70 - 99 (mg/dL)   Comment 1 Documented in Chart     Comment 2 Notify RN    CBC      Component Value Range   WBC 7.5  4.0 - 10.5 (K/uL)   RBC 4.07 (*) 4.22 - 5.81 (MIL/uL)   Hemoglobin 12.1 (*) 13.0 - 17.0 (g/dL)   HCT 16.1 (*) 09.6 - 52.0 (%)   MCV 94.3  78.0 - 100.0 (fL)   MCH 29.7  26.0 - 34.0 (pg)   MCHC 31.5  30.0 - 36.0 (g/dL)   RDW 04.5  40.9 - 81.1 (%)   Platelets 167  150 - 400 (K/uL)  DIFFERENTIAL      Component Value Range   Neutrophils Relative 56  43 - 77 (%)   Neutro Abs 4.2  1.7 - 7.7 (K/uL)   Lymphocytes Relative 33  12 - 46 (%)   Lymphs Abs 2.4  0.7 - 4.0 (K/uL)   Monocytes Relative 9  3 - 12 (%)   Monocytes Absolute 0.6  0.1 - 1.0 (K/uL)   Eosinophils Relative 3  0 - 5 (%)   Eosinophils Absolute 0.2  0.0 - 0.7 (K/uL)   Basophils Relative 0  0 - 1 (%)   Basophils Absolute 0.0  0.0 - 0.1 (K/uL)  BASIC METABOLIC PANEL      Component Value Range   Sodium 140  135 - 145 (mEq/L)   Potassium 3.8  3.5 - 5.1 (mEq/L)   Chloride 102  96 - 112 (mEq/L)   CO2 26  19 - 32 (mEq/L)    Glucose, Bld 123 (*) 70 - 99 (mg/dL)   BUN 17  6 - 23 (mg/dL)   Creatinine, Ser 9.14  0.50 - 1.35 (mg/dL)   Calcium 8.8  8.4 - 78.2 (mg/dL)   GFR calc non Af Amer 59 (*) >90 (mL/min)   GFR calc Af Amer 68 (*) >90 (mL/min)  GLUCOSE, CAPILLARY      Component Value Range   Glucose-Capillary 110 (*) 70 - 99 (mg/dL)   Comment 1 Documented in Chart     Comment 2 Notify RN    PRO B NATRIURETIC PEPTIDE      Component Value Range   Pro B Natriuretic peptide (BNP) 570.0 (*) 0 - 125 (pg/mL)  GLUCOSE, CAPILLARY      Component Value Range   Glucose-Capillary 101 (*) 70 - 99 (mg/dL)   Comment 1 Notify RN    GLUCOSE, CAPILLARY      Component Value Range   Glucose-Capillary 126 (*) 70 -  99 (mg/dL)   Comment 1 Notify RN    GLUCOSE, CAPILLARY      Component Value Range   Glucose-Capillary 179 (*) 70 - 99 (mg/dL)     Signed: Taquan Bralley NEVILL 12/12/2011, 3:47 PM

## 2011-12-12 NOTE — Evaluation (Signed)
Physical Therapy Evaluation and Discharge Patient Details Name: Nicholas [name] MRN: [id number] DOB: June 04, 1941 Today's Date: 12/12/2011 Time: 1478-2956 PT Time Calculation (min): 33 min  PT Assessment / Plan / Recommendation Clinical Impression  Pt is a 71 y/o male admitted with pneumonia with history of MRSA and infection of wound in L foot.  Pt presents with no acute PT needs at this time as he is functioning at his baseline level.     PT Assessment  Patent does not need any further PT services    Follow Up Recommendations  No PT follow up    Barriers to Discharge        lEquipment Recommendations  None recommended by PT    Recommendations for Other Services     Frequency      Precautions / Restrictions Precautions Precautions: None;Other (comment) Precaution Comments: After the session concluded pt informed me that his wound care physician did not want him to do any walking on his Left foot other than walking to/from bathroom.  No order for activity restriction in chart.  Informed RN who was already aware.     Pertinent Vitals/Pain Pt denies pain at rest.  C/o R knee pain with ambulation 4/10. RN notified.        Mobility  Bed Mobility Bed Mobility: Supine to Sit Supine to Sit: 7: Independent Transfers Transfers: Sit to Stand;Stand to Sit Sit to Stand: 6: Modified independent (Device/Increase time);From bed;From chair/3-in-1;With upper extremity assist Stand to Sit: 6: Modified independent (Device/Increase time);To chair/3-in-1 Ambulation/Gait Ambulation/Gait Assistance: 6: Modified independent (Device/Increase time) Ambulation Distance (Feet): 30 Feet Assistive device: Rolling walker Ambulation/Gait Assistance Details: pt demonstrates safety with RW Gait Pattern: Decreased stride length (Left forefoot External rotation. ) Stairs: No Wheelchair Mobility Wheelchair Mobility: No    Exercises Total Joint Exercises Ankle Circles/Pumps: AROM;Both;10 reps;Seated Quad  Sets: Both;10 reps;Seated Heel Slides: Both;Sidelying;10 reps Straight Leg Raises: Both;10 reps;Supine Long Arc Quad: Both;10 reps;Seated   PT Diagnosis:    PT Problem List:   PT Treatment Interventions:     PT Goals    Visit Information  Last PT Received On: 12/12/11 Assistance Needed: +1    Subjective Data  Subjective: Agree to PT eval.   Patient Stated Goal: Return to home   Prior Functioning  Home Living Lives With: Spouse Available Help at Discharge: Family Type of Home: Apartment Home Access: Level entry Home Layout: One level Bathroom Shower/Tub: Forensic scientist: Standard Bathroom Accessibility: Yes How Accessible: Accessible via walker Home Adaptive Equipment: Walker - rolling;Shower chair with back;Grab bars in shower;Wheelchair - IT trainer Prior Function Level of Independence: Needs assistance Needs Assistance: Bathing;Dressing;Meal Prep;Light Housekeeping Able to Take Stairs?: Yes Driving: No Vocation: Retired Musician: No difficulties Dominant Hand: Right    Cognition  Overall Cognitive Status: Appears within functional limits for tasks assessed/performed Arousal/Alertness: Awake/alert Orientation Level: Oriented X4 / Intact Behavior During Session: WFL for tasks performed    Extremity/Trunk Assessment Right Upper Extremity Assessment RUE ROM/Strength/Tone: Within functional levels Left Upper Extremity Assessment LUE ROM/Strength/Tone: Within functional levels (Pt has non-pitting edema in bilateral Lower legs and feet. ) Right Lower Extremity Assessment RLE ROM/Strength/Tone: Deficits RLE ROM/Strength/Tone Deficits: limited ROM and strength in Rt knee from old injury.  Multiple surgical scars.   Left Lower Extremity Assessment LLE ROM/Strength/Tone: Within functional levels Trunk Assessment Trunk Assessment: Normal   Balance Balance Balance Assessed: No  End of Session PT - End of  Session Equipment Utilized During Treatment: Gait belt  Activity Tolerance: Patient tolerated treatment well Patient left: in chair;with call bell/phone within reach;with family/visitor present Nurse Communication: Mobility status   Nicholas [name] 12/12/2011, 10:56 AM Nicholas [name] DPT [phone]

## 2011-12-12 NOTE — Progress Notes (Signed)
Went over all [employer] with pt's wife. He says she does all the care and pills. Wife aware of follow up appts, meds, and 2 gm NA diet. Given prescriptions for antibiotics to start in am. Marisa Cyphers RN

## 2011-12-17 LAB — CULTURE, BLOOD (ROUTINE X 2)
Culture  Setup Time: 201305080122
Culture: NO GROWTH

## 2011-12-23 ENCOUNTER — Ambulatory Visit: Payer: Medicare Other | Admitting: Surgery

## 2012-01-06 ENCOUNTER — Encounter (HOSPITAL_BASED_OUTPATIENT_CLINIC_OR_DEPARTMENT_OTHER): Payer: Medicare Other

## 2012-01-07 ENCOUNTER — Encounter (HOSPITAL_BASED_OUTPATIENT_CLINIC_OR_DEPARTMENT_OTHER): Payer: Medicare Other | Attending: General Surgery

## 2012-01-07 DIAGNOSIS — L97509 Non-pressure chronic ulcer of other part of unspecified foot with unspecified severity: Secondary | ICD-10-CM | POA: Insufficient documentation

## 2012-01-07 DIAGNOSIS — L8990 Pressure ulcer of unspecified site, unspecified stage: Secondary | ICD-10-CM | POA: Insufficient documentation

## 2012-01-07 DIAGNOSIS — L89109 Pressure ulcer of unspecified part of back, unspecified stage: Secondary | ICD-10-CM | POA: Insufficient documentation

## 2012-01-07 DIAGNOSIS — E1169 Type 2 diabetes mellitus with other specified complication: Secondary | ICD-10-CM | POA: Insufficient documentation

## 2012-01-07 DIAGNOSIS — I10 Essential (primary) hypertension: Secondary | ICD-10-CM | POA: Insufficient documentation

## 2012-01-07 DIAGNOSIS — Z79899 Other long term (current) drug therapy: Secondary | ICD-10-CM | POA: Insufficient documentation

## 2012-01-10 ENCOUNTER — Encounter: Payer: Self-pay | Admitting: Neurosurgery

## 2012-01-13 ENCOUNTER — Encounter: Payer: Self-pay | Admitting: *Deleted

## 2012-01-13 ENCOUNTER — Encounter (INDEPENDENT_AMBULATORY_CARE_PROVIDER_SITE_OTHER): Payer: Medicare Other | Admitting: *Deleted

## 2012-01-13 ENCOUNTER — Ambulatory Visit (INDEPENDENT_AMBULATORY_CARE_PROVIDER_SITE_OTHER): Payer: Medicare Other | Admitting: Neurosurgery

## 2012-01-13 ENCOUNTER — Encounter: Payer: Self-pay | Admitting: Neurosurgery

## 2012-01-13 VITALS — BP 152/57 | HR 16 | Resp 16 | Ht 70.0 in | Wt 274.0 lb

## 2012-01-13 DIAGNOSIS — I739 Peripheral vascular disease, unspecified: Secondary | ICD-10-CM

## 2012-01-13 DIAGNOSIS — Z48812 Encounter for surgical aftercare following surgery on the circulatory system: Secondary | ICD-10-CM

## 2012-01-13 NOTE — Progress Notes (Signed)
[facility] [place] HISTORY AND PHYSICAL   CC: Will followup status post left popliteal artery and answered plasty left anterior tibial artery Referring Physician: Brabham  History of Present Illness: Nicholas [name] of Dr. Brabham's with known PAD. [name] follows up today status post left popliteal artery and angioplasty of left anterior tibial artery. The [name]'s main complaints are in the left lower extremity. He has bilateral lower extremity pain at rest as well as with ambulation. He seen a wheelchair today due to pain in bilateral lower extremity edema.  Past Medical History  Diagnosis Date  . Hypertension   . CVA (cerebral infarction)   . Epilepsy   . Peripheral vascular disease   . Ulcer     left foot  . Cellulitis   . Chronic kidney disease   . Dyslipidemia   . Chronic venous insufficiency   . Sleep apnea   . Morbid obesity   . Diverticulosis   . Benign prostatic hypertrophy   . Glaucoma   . Staphylococcus aureus infection   . Gout   . Depression   . Insomnia   . Coronary artery disease   . CAD (coronary artery disease), native coronary artery   . GERD (gastroesophageal reflux disease)   . Heart murmur   . Myocardial infarction ~ 11/2011    "in ICU @ [place]; stress-related"  . Angina   . Pneumonia 12/11/11  . Shortness of breath 12/11/11    "mostly w/exertion"  . Type II diabetes mellitus   . Chronic daily headache     "~ all the time"  . Seizures     hx; "don't know from what"  . Arthritis   . CHF (congestive heart failure) May 2013  . Stroke ~ 1983    "little weaker right hand"  . Recent heart attack 12/09/11    Stress heart attack    ROS: [x] Positive   [ ] Denies    General: [ ] Weight loss, [ ] Fever, [ ] chills Neurologic: [ ] Dizziness, [ ] Blackouts, [ ] Seizure [ ] Stroke, [ ] "Mini stroke", [ ] Slurred speech, [ ] Temporary blindness; [ ] weakness in arms or legs, [ ] Hoarseness Cardiac: [ ] Chest pain/pressure, [  ] Shortness of breath at rest [ ] Shortness of breath with exertion, [ ] Atrial fibrillation or irregular heartbeat Vascular: [ ] Pain in legs with walking, [ ] Pain in legs at rest, [ ] Pain in legs at night,  [ ] Non-healing ulcer, [ ] Blood clot in vein/DVT,   Pulmonary: [ ] Home oxygen, [ ] Productive cough, [ ] Coughing up blood, [ ] Asthma,  [ ] Wheezing Musculoskeletal:  [ ] Arthritis, [ ] Low back pain, [ ] Joint pain Hematologic: [ ] Easy Bruising, [ ] Anemia; [ ] Hepatitis Gastrointestinal: [ ] Blood in stool, [ ] Gastroesophageal Reflux/heartburn, [ ] Trouble swallowing Urinary: [ ] chronic Kidney disease, [ ] on HD - [ ] MWF or [ ] TTHS, [ ] Burning with urination, [ ] Difficulty urinating Skin: [ ] Rashes, [ ] Wounds Psychological: [ ] Anxiety, [ ] Depression   Social History History  Substance Use Topics  . Smoking status: Former Smoker -- 1.0 packs/day for 20 years    Types: Cigarettes    Quit date: 07/29/1991  . Smokeless tobacco: Former User    Types: Chew    Quit date: 08/06/1987  . Alcohol Use: No       occasionallly    Family History Family History  Problem Relation Age of Onset  . Hypertension Other   . Cancer Other     No Known Allergies  Current Outpatient Prescriptions  Medication Sig Dispense Refill  . allopurinol (ZYLOPRIM) 300 MG tablet Take 300 mg by mouth daily.        . ALPRAZolam (XANAX) 1 MG tablet Take 0.5-2 mg by mouth 3 (three) times daily. Takes half tablet in the morning and at 430 pm and 2 tablets at bedtime.      . amoxicillin-clavulanate (AUGMENTIN) 875-125 MG per tablet Take 875 mg by mouth 2 (two) times daily at 10 AM and 5 PM. One half AM and one half PM      . atenolol (TENORMIN) 50 MG tablet Take 50 mg by mouth 2 (two) times daily.       . brimonidine-timolol (COMBIGAN) 0.2-0.5 % ophthalmic solution Place 1 drop into both eyes every 12 (twelve) hours.      . clopidogrel (PLAVIX) 75 MG tablet Take 75 mg by mouth daily.        .  diclofenac sodium (VOLTAREN) 1 % GEL Apply 1 application topically 4 (four) times daily as needed. Applies to both knees as needed for pain      . furosemide (LASIX) 80 MG tablet Take 160 mg by mouth daily.       . glipiZIDE (GLUCOTROL) 5 MG tablet Take 5 mg by mouth 2 (two) times daily before a meal.        . HYDROcodone-acetaminophen (VICODIN ES) 7.5-750 MG per tablet Take 1 tablet by mouth every 6 (six) hours as needed. For pain      . insulin glargine (LANTUS) 100 UNIT/ML injection Inject 5-10 Units into the skin 2 (two) times daily as needed. Per sliding scale in divided doses. May use up to 140 units/day. Usual dose is 5-10 units      . isosorbide mononitrate (IMDUR) 30 MG 24 hr tablet Take 30 mg by mouth daily.      . latanoprost (XALATAN) 0.005 % ophthalmic solution Place 1 drop into both eyes at bedtime.        . nitroGLYCERIN (NITROSTAT) 0.4 MG SL tablet Place 0.4 mg under the tongue every 5 (five) minutes as needed. For chest pain      . olopatadine (PATANOL) 0.1 % ophthalmic solution Place 1 drop into both eyes 2 (two) times daily.        . omeprazole (PRILOSEC) 20 MG capsule Take 20 mg by mouth 2 (two) times daily. For heart burn      . perphenazine (TRILAFON) 8 MG tablet Take 8 mg by mouth at bedtime.      . potassium chloride SA (K-DUR,KLOR-CON) 20 MEQ tablet Take 60 mEq by mouth 3 (three) times daily. Takes 3 tablets for each dose      . simvastatin (ZOCOR) 20 MG tablet Take 20 mg by mouth every evening.       . temazepam (RESTORIL) 30 MG capsule Take 30 mg by mouth at bedtime.       . ciprofloxacin-dexamethasone (CIPRODEX) otic suspension Place 4 drops into the right ear 2 (two) times daily. Hole in ear drum      . metolazone (ZAROXOLYN) 5 MG tablet Take 5 mg by mouth every Monday, Wednesday, and Friday.       . terazosin (HYTRIN) 5 MG capsule Take 5 mg by mouth at bedtime.        Physical Examination    Filed Vitals:   01/13/12 1430  BP: 152/57  Pulse: 16  Resp: 16     Body mass index is 39.32 kg/(m^2).  General:  WDWN in NAD Gait: Normal HEENT: WNL Eyes: Pupils equal Pulmonary: normal non-labored breathing , without Rales, rhonchi,  wheezing Cardiac: RRR, without  Murmurs, rubs or gallops; No carotid bruits Abdomen: soft, NT, no masses Skin: no rashes, ulcers noted Vascular Exam/Pulses: Lower extremity pulses are not palpable  Extremities without ischemic changes, no Gangrene , no cellulitis; no open wounds;  Musculoskeletal: no muscle wasting or atrophy  Neurologic: A&O X 3; Appropriate Affect ; SENSATION: normal; MOTOR FUNCTION:  moving all extremities equally. Speech is fluent/normal  Non-Invasive Vascular Imaging: ABIs today are 0.61 and monophasic on the right, 0.57 and monophasic on the left, this is unchanged from previous. [name] has extremely high elevation in velocities in the left lower extremity especially the popliteal artery. Spoke to Dr. Brabham reviewed these findings with me.  ASSESSMENT/PLAN: Assessment as above, per Dr. Brabham we will schedule the [name] for an angiogram left lower extremity with possible intervention for this coming week. That'll be set up through our office, followup with Dr. Brabham be pending that procedure. The [name]'s questions were encouraged and answered.  Quinette Hentges ANP   Clinic M.D. Brabham  

## 2012-01-13 NOTE — Procedures (Unsigned)
LOWER EXTREMITY ARTERIAL DUPLEX  INDICATION:  Followup left lower extremity revascularization  HISTORY: Diabetes:  Yes Cardiac:  Yes Hypertension:  Yes Smoking:  Previous Previous Surgery:  Left popliteal/anterior tibial arterial angioplasty 10/02/2010; left anterior tibial artery angioplasty 12/11/2010; left popliteal atherectomy with angioplasty 05/07/2011.  SINGLE LEVEL ARTERIAL EXAM                         RIGHT                LEFT Brachial: Anterior tibial: Posterior tibial: Peroneal: Ankle/Brachial Index:   0.61                 0.57  LOWER EXTREMITY ARTERIAL DUPLEX EXAM  PREVIOUS ANKLE BRACHIAL INDEX 09/18/2011:  Right:  0.63 Left:  0.55  DUPLEX:  Significant disease is observed in the left popliteal artery. Elevated velocities are present throughout the popliteal artery with focal elevation at the proximal and distal segments.  Previous duplex exam showed velocities of 578 cm/s at the TP trunk.  Today's velocities are 650 just proximal to the ATA takeoff.  IMPRESSION: 1. 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 2. Please see attached diagram for velocity details.  ___________________________________________ V. Charlena Cross, MD  LT/MEDQ  D:  01/13/2012  T:  01/13/2012  Job:  [id number]

## 2012-01-13 NOTE — Progress Notes (Signed)
Patient ID: Nicholas [name], male   DOB: 08/31/1940, 71 y.o.   MRN: [id number]

## 2012-01-14 ENCOUNTER — Other Ambulatory Visit: Payer: Self-pay | Admitting: *Deleted

## 2012-01-15 ENCOUNTER — Encounter (HOSPITAL_COMMUNITY): Payer: Self-pay | Admitting: Respiratory Therapy

## 2012-01-21 ENCOUNTER — Encounter (HOSPITAL_COMMUNITY)
Admission: RE | Disposition: A | Discharge status: Home / Self Care | Payer: Self-pay | Source: Ambulatory Visit | Attending: Surgery

## 2012-01-21 ENCOUNTER — Encounter (HOSPITAL_COMMUNITY): Payer: Self-pay

## 2012-01-21 ENCOUNTER — Telehealth: Payer: Self-pay | Admitting: Surgery

## 2012-01-21 ENCOUNTER — Ambulatory Visit (HOSPITAL_COMMUNITY): Admit: 2012-01-21 | Payer: Medicare Other | Admitting: Surgery

## 2012-01-21 ENCOUNTER — Ambulatory Visit (HOSPITAL_COMMUNITY)
Admission: RE | Admit: 2012-01-21 | Discharge: 2012-01-21 | Disposition: A | Discharge status: Home / Self Care | Payer: Medicare Other | Source: Ambulatory Visit | Attending: Surgery | Admitting: Surgery

## 2012-01-21 DIAGNOSIS — N189 Chronic kidney disease, unspecified: Secondary | ICD-10-CM | POA: Insufficient documentation

## 2012-01-21 DIAGNOSIS — L98499 Non-pressure chronic ulcer of skin of other sites with unspecified severity: Secondary | ICD-10-CM

## 2012-01-21 DIAGNOSIS — I2510 Atherosclerotic heart disease of native coronary artery without angina pectoris: Secondary | ICD-10-CM | POA: Insufficient documentation

## 2012-01-21 DIAGNOSIS — I739 Peripheral vascular disease, unspecified: Secondary | ICD-10-CM

## 2012-01-21 DIAGNOSIS — E119 Type 2 diabetes mellitus without complications: Secondary | ICD-10-CM | POA: Insufficient documentation

## 2012-01-21 DIAGNOSIS — I129 Hypertensive chronic kidney disease with stage 1 through stage 4 chronic kidney disease, or unspecified chronic kidney disease: Secondary | ICD-10-CM | POA: Insufficient documentation

## 2012-01-21 HISTORY — PX: LOWER EXTREMITY ANGIOGRAM: SHX5508

## 2012-01-21 HISTORY — PX: TRANSLUMINAL ATHERECTOMY POPLITEAL ARTERY: SHX2569

## 2012-01-21 LAB — POCT I-STAT, CHEM 8
BUN: 20 mg/dL (ref 6–23)
Chloride: 106 mEq/L (ref 96–112)
Creatinine, Ser: 1.1 mg/dL (ref 0.50–1.35)
Glucose, Bld: 117 mg/dL — ABNORMAL HIGH (ref 70–99)
Potassium: 4.9 mEq/L (ref 3.5–5.1)

## 2012-01-21 LAB — POCT ACTIVATED CLOTTING TIME
Activated Clotting Time: 224 seconds
Activated Clotting Time: 175 seconds

## 2012-01-21 LAB — GLUCOSE, CAPILLARY: Glucose-Capillary: 99 mg/dL (ref 70–99)

## 2012-01-21 SURGERY — ANGIOGRAM, LOWER EXTREMITY
Anesthesia: Moderate Sedation | Site: Leg Upper | Laterality: Bilateral

## 2012-01-21 SURGERY — ABDOMINAL AORTAGRAM
Anesthesia: LOCAL

## 2012-01-21 MED ORDER — OXYCODONE HCL 5 MG PO TABS: 5.0000 mg | ORAL_TABLET | ORAL | Status: DC | PRN

## 2012-01-21 MED ORDER — MORPHINE SULFATE 4 MG/ML IJ SOLN
INTRAMUSCULAR | Status: AC
  Filled 2012-01-21: qty 1

## 2012-01-21 MED ORDER — ALUM & MAG HYDROXIDE-SIMETH 200-200-20 MG/5ML PO SUSP: 15.0000 mL | ORAL | Status: DC | PRN

## 2012-01-21 MED ORDER — MORPHINE SULFATE 2 MG/ML IJ SOLN
2.0000 mg | INTRAMUSCULAR | Status: DC | PRN
  Administered 2012-01-21: 4 mg via INTRAVENOUS

## 2012-01-21 MED ORDER — ACETAMINOPHEN 325 MG RE SUPP: 325.0000 mg | RECTAL | Status: DC | PRN

## 2012-01-21 MED ORDER — HYDRALAZINE HCL 20 MG/ML IJ SOLN
10.0000 mg | INTRAMUSCULAR | Status: DC | PRN
  Administered 2012-01-21: 10 mg via INTRAVENOUS

## 2012-01-21 MED ORDER — PHENOL 1.4 % MT LIQD: 1.0000 | OROMUCOSAL | Status: DC | PRN

## 2012-01-21 MED ORDER — LABETALOL HCL 5 MG/ML IV SOLN: 10.0000 mg | INTRAVENOUS | Status: DC | PRN

## 2012-01-21 MED ORDER — SODIUM CHLORIDE 0.9 % IV SOLN: 1.0000 mL/kg/h | INTRAVENOUS | Status: DC

## 2012-01-21 MED ORDER — ONDANSETRON HCL 4 MG/2ML IJ SOLN: 4.0000 mg | Freq: Four times a day (QID) | INTRAMUSCULAR | Status: DC | PRN

## 2012-01-21 MED ORDER — LIDOCAINE HCL (PF) 1 % IJ SOLN
INTRAMUSCULAR | Status: AC
  Filled 2012-01-21: qty 30

## 2012-01-21 MED ORDER — METOPROLOL TARTRATE 1 MG/ML IV SOLN: 2.0000 mg | INTRAVENOUS | Status: DC | PRN

## 2012-01-21 MED ORDER — ACETAMINOPHEN 325 MG PO TABS: 325.0000 mg | ORAL_TABLET | ORAL | Status: DC | PRN

## 2012-01-21 MED ORDER — HEPARIN (PORCINE) IN NACL 2-0.9 UNIT/ML-% IJ SOLN
INTRAMUSCULAR | Status: AC
  Filled 2012-01-21: qty 1000

## 2012-01-21 MED ORDER — SODIUM CHLORIDE 0.9 % IV SOLN
INTRAVENOUS | Status: DC
  Administered 2012-01-21: 1000 mL via INTRAVENOUS

## 2012-01-21 MED ORDER — HYDRALAZINE HCL 20 MG/ML IJ SOLN
10.0000 mg | INTRAMUSCULAR | Status: DC
  Filled 2012-01-21: qty 0.5

## 2012-01-21 MED ORDER — VERAPAMIL HCL 2.5 MG/ML IV SOLN
INTRAVENOUS | Status: AC
  Filled 2012-01-21: qty 2

## 2012-01-21 MED ORDER — MIDAZOLAM HCL 2 MG/2ML IJ SOLN
INTRAMUSCULAR | Status: AC
  Filled 2012-01-21: qty 4

## 2012-01-21 MED ORDER — NITROGLYCERIN 0.2 MG/ML ON CALL CATH LAB
INTRAVENOUS | Status: AC
  Filled 2012-01-21: qty 1

## 2012-01-21 MED ORDER — GUAIFENESIN-DM 100-10 MG/5ML PO SYRP: 15.0000 mL | ORAL_SOLUTION | ORAL | Status: DC | PRN

## 2012-01-21 MED ORDER — HEPARIN SODIUM (PORCINE) 1000 UNIT/ML IJ SOLN
INTRAMUSCULAR | Status: AC
  Filled 2012-01-21: qty 1

## 2012-01-21 MED ORDER — FENTANYL CITRATE 0.05 MG/ML IJ SOLN
INTRAMUSCULAR | Status: AC
  Filled 2012-01-21: qty 4

## 2012-01-21 SURGICAL SUPPLY — 13 items
BAG SNAP BAND KOVER 36X36 (MISCELLANEOUS) ×3 IMPLANT
BALLOON FOX SV 5.0X20 (BALLOONS) ×1 IMPLANT
CATH CXI SUPP ANG 2.6FR 150CM (MICROCATHETER) ×1 IMPLANT
CATH OMNI FLUSH .035X70CM (CATHETERS) ×1 IMPLANT
COVER PROBE W GEL 5X96 (DRAPES) ×1 IMPLANT
KIT ENCORE 26 ADVANTAGE (KITS) ×1 IMPLANT
SET MICROPUNCTURE 5F STIFF (MISCELLANEOUS) ×1 IMPLANT
SHEATH AVANTI 11CM 5FR (MISCELLANEOUS) ×1 IMPLANT
SHEATH FLEX ANSEL ST 6FR 45CM (SHEATH) ×1 IMPLANT
TUBING HIGH PRESSURE 120CM (CONNECTOR) ×1 IMPLANT
WIRE AMPLATZ SS-J .035X180CM (WIRE) ×1 IMPLANT
WIRE BENTSON .035X145CM (WIRE) ×1 IMPLANT
WIRE SPARTACORE .014X190CM (WIRE) ×1 IMPLANT

## 2012-01-21 NOTE — Op Note (Signed)
[facility]  Patient name: Nicholas [name] MRN: [id number] DOB: [birth year] Sex: male  01/21/2012 Pre-operative Diagnosis: Left leg ulcer Post-operative diagnosis:  Same Surgeon:  Jorge Ny Procedure Performed:  1.  ultrasound access right femoral artery  2.  abdominal aortogram  3.  left lower extremity runoff  4.  third order catheterization  5.  intra-arterial administration of nitroglycerin  6.  atherectomy left popliteal artery  7.  angioplasty left popliteal artery   Indications:  The patient has a history of a left foot ulcer. He has undergone multiple interventions in the past. He recently had a followup ultrasound which showed recurrence of the stenosis within his popliteal artery. He comes in today for further evaluation and possible intervention  Procedure:  The patient was identified in the holding area and taken to room 8.  The patient was then placed supine on the table and prepped and draped in the usual sterile fashion.  A time out was called.  Ultrasound was used to evaluate the right common femoral artery.  It was patent .  A digital ultrasound image was acquired.  A micropuncture needle was used to access the right common femoral artery under ultrasound guidance.  An 018 wire was advanced without resistance and a micropuncture sheath was placed.  The 018 wire was removed and a benson wire was placed.  The micropuncture sheath was exchanged for a 5 french sheath.  An omniflush catheter was advanced over the wire to the level of L-1.  An abdominal angiogram was obtained.  Next, using the omniflush catheter and a benson wire, the aortic bifurcation was crossed and the catheter was placed into theleft external iliac artery and left runoff was obtained.    Findings:   Aortogram:  The visualized portions of the suprarenal abdominal aorta showed no significant stenosis. There is no evidence of renal artery stenosis. The infrarenal abdominal  aorta is patent throughout it's course. Bilateral common external and internal iliac arteries are patent without significant stenosis.  Left Lower Extremity:  Left common femoral artery is patent throughout it's course. Left profunda femoral artery is patent throughout it's course. The left superficial femoral artery is widely patent. There is a focal stenosis within the left popliteal artery behind the knee. This is approximately 85%. There are three-vessel runoff to the foot however the posterior tibial is the dominant vessel. The anterior tibial artery occludes at the ankle. The pedal arch is performed from the posterior tibial artery.  Intervention:  After the above images were obtained, the decision was made to proceed with intervention. Over an 035 wire a 6 French Ansel sheath was placed. The patient was fully heparinized. Using a 014 Sparta core wire and a CXI catheter, wire access into the peroneal artery was obtained. The Sparta core wire was replaced with a Viper wire. I then selected a 2.0 CSI atherectomy device and performed atherectomy of the left popliteal artery at 6090 and 120 RPMs. I then performed angioplasty of this lesion using a 5 x 20 Fox SV balloon. I then administered 600 mcg of nitroglycerin into the popliteal artery. A completion arteriogram was then performed which runoff. At this point in time the decision was made to terminate the procedure. Catheters and wires were removed. The sheath was withdrawn to the right external iliac artery. The patient will be taken to the holding area for sheath pull once his coagulation profile corrects.showed complete resolution of the stenosis within the popliteal artery  and preservation of his  Impression:  #1   successful atherectomy of the left popliteal artery using a CSI 2.0 device and a 5 x 20 Fox SV balloon    V. Durene Cal, M.D. [facility] Office: [phone] Pager:  [phone]

## 2012-01-21 NOTE — Telephone Encounter (Signed)
Message copied by Fredrich Birks on Tue Jan 21, 2012  3:37 PM ------      Message from: Melene Plan      Created: Tue Jan 21, 2012  3:01 PM                   ----- Message -----         From: Nada Libman, MD         Sent: 01/21/2012  12:56 PM           To: Reuel Derby, Melene Plan, RN            01/21/2012, the patient had the following procedures:             1.  ultrasound access right femoral artery       2.  abdominal aortogram       3.  left lower extremity runoff       4.  third order catheterization       5.  intra-arterial administration of nitroglycerin       6.  atherectomy left popliteal artery       7.  angioplasty left popliteal artery            Please schedule the patient to come back to see Rusty in 3 months with a duplex ultrasound of the left leg

## 2012-01-21 NOTE — Telephone Encounter (Signed)
Sent letter, pt not available, dpm

## 2012-01-21 NOTE — Brief Op Note (Signed)
01/21/2012  10:45 PM  PATIENT:  Nicholas [name]  71 y.o. male  PRE-OPERATIVE DIAGNOSIS:  PVD with ulcer POST-OPERATIVE DIAGNOSIS:  same  PROCEDURE:  Procedure(s) (LRB): Abdominal angiogram U/s access R CFA L LE runoff Popliteal atherectomy and angioplasty  SURGEON:  Surgeon(s) and Role:    Nada Libman, MD - Primary  PHYSICIAN ASSISTANT:   ASSISTANTS: none   ANESTHESIA:   none  EBL:     BLOOD ADMINISTERED:none  DRAINS: none   LOCAL MEDICATIONS USED:  LIDOCAINE   SPECIMEN:  No Specimen  DISPOSITION OF SPECIMEN:  N/A  COUNTS:  YES  TOURNIQUET:  * No tourniquets in log *  DICTATION: .Dragon Dictation  PLAN OF CARE: Discharge to home after PACU  PATIENT DISPOSITION:  PACU - hemodynamically stable.   Delay start of Pharmacological VTE agent (>24hrs) due to surgical blood loss or risk of bleeding: not applicable

## 2012-01-21 NOTE — Progress Notes (Signed)
DR Myra Gianotti NOTIFIED OF SYSTOLIC B/P 182 AND PER DR BRABHAM OK TO D/C HOME

## 2012-01-21 NOTE — Progress Notes (Signed)
DR Myra Gianotti NOTIFIED OF B/P AND PER DR Myra Gianotti WILL GIVE HYDRALAZINE IV AND IF B/P LESS THAN 170 SYSTOLIC MAY D/C HOME

## 2012-01-21 NOTE — Discharge Instructions (Signed)

## 2012-01-21 NOTE — Interval H&P Note (Signed)
History and Physical Interval Note:  01/21/2012 7:28 AM  Nicholas [name]  has presented today for surgery, with the diagnosis of PVD  The various methods of treatment have been discussed with the patient and family. After consideration of risks, benefits and other options for treatment, the patient has consented to  Procedure(s) (LRB): LOWER EXTREMITY ANGIOGRAM (Bilateral) as a surgical intervention .  The patient's history has been reviewed, patient examined, no change in status, stable for surgery.  I have reviewed the patients' chart and labs.  Questions were answered to the patient's satisfaction.     [name] IV, V. WELLS

## 2012-01-21 NOTE — H&P (View-Only) (Signed)
[facility] [place] HISTORY AND PHYSICAL   CC: Will followup status post left popliteal artery and answered plasty left anterior tibial artery Referring Physician: Brabham  History of Present Illness: 71 year old patient of Dr. Estanislado Spire with known PAD. Patient follows up today status post left popliteal artery and angioplasty of left anterior tibial artery. The patient's main complaints are in the left lower extremity. He has bilateral lower extremity pain at rest as well as with ambulation. He seen a wheelchair today due to pain in bilateral lower extremity edema.  Past Medical History  Diagnosis Date  . Hypertension   . CVA (cerebral infarction)   . Epilepsy   . Peripheral vascular disease   . Ulcer     left foot  . Cellulitis   . Chronic kidney disease   . Dyslipidemia   . Chronic venous insufficiency   . Sleep apnea   . Morbid obesity   . Diverticulosis   . Benign prostatic hypertrophy   . Glaucoma   . Staphylococcus aureus infection   . Gout   . Depression   . Insomnia   . Coronary artery disease   . CAD (coronary artery disease), native coronary artery   . GERD (gastroesophageal reflux disease)   . Heart murmur   . Myocardial infarction ~ 11/2011    "in ICU @ [facility]; stress-related"  . Angina   . Pneumonia 12/11/11  . Shortness of breath 12/11/11    "mostly w/exertion"  . Type II diabetes mellitus   . Chronic daily headache     "~ all the time"  . Seizures     hx; "don't know from what"  . Arthritis   . CHF (congestive heart failure) May 2013  . Stroke ~ 1983    "little weaker right hand"  . Recent heart attack 12/09/11    Stress heart attack    ROS: [x]  Positive   [ ]  Denies    General: [ ]  Weight loss, [ ]  Fever, [ ]  chills Neurologic: [ ]  Dizziness, [ ]  Blackouts, [ ]  Seizure [ ]  Stroke, [ ]  "Mini stroke", [ ]  Slurred speech, [ ]  Temporary blindness; [ ]  weakness in arms or legs, [ ]  Hoarseness Cardiac: [ ]  Chest pain/pressure, [  ] Shortness of breath at rest [ ]  Shortness of breath with exertion, [ ]  Atrial fibrillation or irregular heartbeat Vascular: [ ]  Pain in legs with walking, [ ]  Pain in legs at rest, [ ]  Pain in legs at night,  [ ]  Non-healing ulcer, [ ]  Blood clot in vein/DVT,   Pulmonary: [ ]  Home oxygen, [ ]  Productive cough, [ ]  Coughing up blood, [ ]  Asthma,  [ ]  Wheezing Musculoskeletal:  [ ]  Arthritis, [ ]  Low back pain, [ ]  Joint pain Hematologic: [ ]  Easy Bruising, [ ]  Anemia; [ ]  Hepatitis Gastrointestinal: [ ]  Blood in stool, [ ]  Gastroesophageal Reflux/heartburn, [ ]  Trouble swallowing Urinary: [ ]  chronic Kidney disease, [ ]  on HD - [ ]  MWF or [ ]  TTHS, [ ]  Burning with urination, [ ]  Difficulty urinating Skin: [ ]  Rashes, [ ]  Wounds Psychological: [ ]  Anxiety, [ ]  Depression   Social History History  Substance Use Topics  . Smoking status: Former Smoker -- 1.0 packs/day for 20 years    Types: Cigarettes    Quit date: 07/29/1991  . Smokeless tobacco: Former Neurosurgeon    Types: Chew    Quit date: 08/06/1987  . Alcohol Use: No  occasionallly    Family History Family History  Problem Relation Age of Onset  . Hypertension Other   . Cancer Other     No Known Allergies  Current Outpatient Prescriptions  Medication Sig Dispense Refill  . allopurinol (ZYLOPRIM) 300 MG tablet Take 300 mg by mouth daily.        Marland Kitchen ALPRAZolam (XANAX) 1 MG tablet Take 0.5-2 mg by mouth 3 (three) times daily. Takes half tablet in the morning and at 430 pm and 2 tablets at bedtime.      Marland Kitchen amoxicillin-clavulanate (AUGMENTIN) 875-125 MG per tablet Take 875 mg by mouth 2 (two) times daily at 10 AM and 5 PM. One half AM and one half PM      . atenolol (TENORMIN) 50 MG tablet Take 50 mg by mouth 2 (two) times daily.       . brimonidine-timolol (COMBIGAN) 0.2-0.5 % ophthalmic solution Place 1 drop into both eyes every 12 (twelve) hours.      . clopidogrel (PLAVIX) 75 MG tablet Take 75 mg by mouth daily.        .  diclofenac sodium (VOLTAREN) 1 % GEL Apply 1 application topically 4 (four) times daily as needed. Applies to both knees as needed for pain      . furosemide (LASIX) 80 MG tablet Take 160 mg by mouth daily.       Marland Kitchen glipiZIDE (GLUCOTROL) 5 MG tablet Take 5 mg by mouth 2 (two) times daily before a meal.        . HYDROcodone-acetaminophen (VICODIN ES) 7.5-750 MG per tablet Take 1 tablet by mouth every 6 (six) hours as needed. For pain      . insulin glargine (LANTUS) 100 UNIT/ML injection Inject 5-10 Units into the skin 2 (two) times daily as needed. Per sliding scale in divided doses. May use up to 140 units/day. Usual dose is 5-10 units      . isosorbide mononitrate (IMDUR) 30 MG 24 hr tablet Take 30 mg by mouth daily.      Marland Kitchen latanoprost (XALATAN) 0.005 % ophthalmic solution Place 1 drop into both eyes at bedtime.        . nitroGLYCERIN (NITROSTAT) 0.4 MG SL tablet Place 0.4 mg under the tongue every 5 (five) minutes as needed. For chest pain      . olopatadine (PATANOL) 0.1 % ophthalmic solution Place 1 drop into both eyes 2 (two) times daily.        Marland Kitchen omeprazole (PRILOSEC) 20 MG capsule Take 20 mg by mouth 2 (two) times daily. For heart burn      . perphenazine (TRILAFON) 8 MG tablet Take 8 mg by mouth at bedtime.      . potassium chloride SA (K-DUR,KLOR-CON) 20 MEQ tablet Take 60 mEq by mouth 3 (three) times daily. Takes 3 tablets for each dose      . simvastatin (ZOCOR) 20 MG tablet Take 20 mg by mouth every evening.       . temazepam (RESTORIL) 30 MG capsule Take 30 mg by mouth at bedtime.       . ciprofloxacin-dexamethasone (CIPRODEX) otic suspension Place 4 drops into the right ear 2 (two) times daily. Hole in ear drum      . metolazone (ZAROXOLYN) 5 MG tablet Take 5 mg by mouth every Monday, Wednesday, and Friday.       . terazosin (HYTRIN) 5 MG capsule Take 5 mg by mouth at bedtime.        Physical Examination  Filed Vitals:   01/13/12 1430  BP: 152/57  Pulse: 16  Resp: 16     Body mass index is 39.32 kg/(m^2).  General:  WDWN in NAD Gait: Normal HEENT: WNL Eyes: Pupils equal Pulmonary: normal non-labored breathing , without Rales, rhonchi,  wheezing Cardiac: RRR, without  Murmurs, rubs or gallops; No carotid bruits Abdomen: soft, NT, no masses Skin: no rashes, ulcers noted Vascular Exam/Pulses: Lower extremity pulses are not palpable  Extremities without ischemic changes, no Gangrene , no cellulitis; no open wounds;  Musculoskeletal: no muscle wasting or atrophy  Neurologic: A&O X 3; Appropriate Affect ; SENSATION: normal; MOTOR FUNCTION:  moving all extremities equally. Speech is fluent/normal  Non-Invasive Vascular Imaging: ABIs today are 0.61 and monophasic on the right, 0.57 and monophasic on the left, this is unchanged from previous. Patient has extremely high elevation in velocities in the left lower extremity especially the popliteal artery. Spoke to Dr. Myra Gianotti reviewed these findings with me.  ASSESSMENT/PLAN: Assessment as above, per Dr. Myra Gianotti we will schedule the patient for an angiogram left lower extremity with possible intervention for this coming week. That'll be set up through our office, followup with Dr. Myra Gianotti be pending that procedure. The patient's questions were encouraged and answered.  Lauree Chandler ANP   Clinic M.D. Brabham

## 2012-01-22 ENCOUNTER — Encounter (HOSPITAL_COMMUNITY): Payer: Self-pay | Admitting: Surgery

## 2012-01-22 ENCOUNTER — Other Ambulatory Visit: Payer: Self-pay | Admitting: *Deleted

## 2012-01-22 DIAGNOSIS — Z48812 Encounter for surgical aftercare following surgery on the circulatory system: Secondary | ICD-10-CM

## 2012-01-22 DIAGNOSIS — I739 Peripheral vascular disease, unspecified: Secondary | ICD-10-CM

## 2012-02-04 ENCOUNTER — Encounter (HOSPITAL_BASED_OUTPATIENT_CLINIC_OR_DEPARTMENT_OTHER): Payer: Medicare Other | Attending: General Surgery

## 2012-02-04 DIAGNOSIS — L97509 Non-pressure chronic ulcer of other part of unspecified foot with unspecified severity: Secondary | ICD-10-CM | POA: Insufficient documentation

## 2012-02-04 DIAGNOSIS — E785 Hyperlipidemia, unspecified: Secondary | ICD-10-CM | POA: Insufficient documentation

## 2012-02-04 DIAGNOSIS — L8992 Pressure ulcer of unspecified site, stage 2: Secondary | ICD-10-CM | POA: Insufficient documentation

## 2012-02-04 DIAGNOSIS — L89109 Pressure ulcer of unspecified part of back, unspecified stage: Secondary | ICD-10-CM | POA: Insufficient documentation

## 2012-02-04 DIAGNOSIS — I739 Peripheral vascular disease, unspecified: Secondary | ICD-10-CM | POA: Insufficient documentation

## 2012-02-04 DIAGNOSIS — E1169 Type 2 diabetes mellitus with other specified complication: Secondary | ICD-10-CM | POA: Insufficient documentation

## 2012-02-04 DIAGNOSIS — I10 Essential (primary) hypertension: Secondary | ICD-10-CM | POA: Insufficient documentation

## 2012-02-04 DIAGNOSIS — Z79899 Other long term (current) drug therapy: Secondary | ICD-10-CM | POA: Insufficient documentation

## 2012-03-10 ENCOUNTER — Encounter (HOSPITAL_BASED_OUTPATIENT_CLINIC_OR_DEPARTMENT_OTHER): Payer: Medicare Other

## 2012-04-28 ENCOUNTER — Ambulatory Visit: Payer: Medicare Other | Admitting: Neurosurgery

## 2012-05-08 HISTORY — PX: EYE SURGERY: SHX253

## 2012-05-12 ENCOUNTER — Encounter (HOSPITAL_BASED_OUTPATIENT_CLINIC_OR_DEPARTMENT_OTHER): Payer: Medicare Other | Attending: General Surgery

## 2012-05-12 DIAGNOSIS — L8992 Pressure ulcer of unspecified site, stage 2: Secondary | ICD-10-CM | POA: Insufficient documentation

## 2012-05-12 DIAGNOSIS — L97509 Non-pressure chronic ulcer of other part of unspecified foot with unspecified severity: Secondary | ICD-10-CM | POA: Insufficient documentation

## 2012-05-12 DIAGNOSIS — L89109 Pressure ulcer of unspecified part of back, unspecified stage: Secondary | ICD-10-CM | POA: Insufficient documentation

## 2012-05-12 DIAGNOSIS — E1169 Type 2 diabetes mellitus with other specified complication: Secondary | ICD-10-CM | POA: Insufficient documentation

## 2012-05-15 ENCOUNTER — Encounter: Payer: Self-pay | Admitting: Neurosurgery

## 2012-05-18 ENCOUNTER — Encounter (INDEPENDENT_AMBULATORY_CARE_PROVIDER_SITE_OTHER): Payer: Medicare Other | Admitting: *Deleted

## 2012-05-18 ENCOUNTER — Encounter: Payer: Self-pay | Admitting: Neurosurgery

## 2012-05-18 ENCOUNTER — Encounter: Payer: Self-pay | Admitting: *Deleted

## 2012-05-18 ENCOUNTER — Ambulatory Visit (INDEPENDENT_AMBULATORY_CARE_PROVIDER_SITE_OTHER): Payer: Medicare Other | Admitting: Neurosurgery

## 2012-05-18 ENCOUNTER — Other Ambulatory Visit: Payer: Self-pay | Admitting: *Deleted

## 2012-05-18 VITALS — BP 165/67 | HR 65 | Resp 16 | Ht 69.0 in | Wt 283.0 lb

## 2012-05-18 DIAGNOSIS — Z48812 Encounter for surgical aftercare following surgery on the circulatory system: Secondary | ICD-10-CM

## 2012-05-18 DIAGNOSIS — I739 Peripheral vascular disease, unspecified: Secondary | ICD-10-CM

## 2012-05-18 NOTE — Progress Notes (Signed)
[facility] [place] PAD/PVD Office Note  CC: PAD surveillance Referring Physician: Brabham  History of Present Illness: 71-year-old male patient of Dr. Brabham who is status post, Procedure(s) (LRB):  Abdominal angiogram  U/s access R CFA  L LE runoff  Popliteal atherectomy and angioplasty, in June 2013. The patient followed up with Dr. Brabham in August and had had some relief with his claudication to that point. The patient states today his legs "hurt all the time". The patient has orthopedic issues with both knees, right greater than left and states he cannot have orthopedic surgery due to a history of infection and his orthopedic surgeon has refused to do any further work. The patient states he cannot ambulate do to leg pain that is probably vascular as well as orthopedic in nature.   Past Medical History  Diagnosis Date  . Hypertension   . CVA (cerebral infarction)   . Epilepsy   . Peripheral vascular disease   . Ulcer     left foot  . Cellulitis   . Chronic kidney disease   . Dyslipidemia   . Chronic venous insufficiency   . Sleep apnea   . Morbid obesity   . Diverticulosis   . Benign prostatic hypertrophy   . Glaucoma   . Staphylococcus aureus infection   . Gout   . Depression   . Insomnia   . Coronary artery disease   . CAD (coronary artery disease), native coronary artery   . GERD (gastroesophageal reflux disease)   . Heart murmur   . Myocardial infarction ~ 11/2011    "in ICU @ [place]; stress-related"  . Angina   . Pneumonia 12/11/11  . Shortness of breath 12/11/11    "mostly w/exertion"  . Type II diabetes mellitus   . Chronic daily headache     "~ all the time"  . Seizures     hx; "don't know from what"  . Arthritis   . CHF (congestive heart failure) May 2013  . Stroke ~ 1983    "little weaker right hand"  . Recent heart attack 12/09/11    Stress heart attack    ROS: [x] Positive   [ ] Denies    General: [ ] Weight loss, [ ]  Fever, [ ] chills Neurologic: [ ] Dizziness, [ ] Blackouts, [ ] Seizure [ ] Stroke, [ ] "Mini stroke", [ ] Slurred speech, [ ] Temporary blindness; [ ] weakness in arms or legs, [ ] Hoarseness Cardiac: [ ] Chest pain/pressure, [ ] Shortness of breath at rest [ ] Shortness of breath with exertion, [ ] Atrial fibrillation or irregular heartbeat Vascular: [ ] Pain in legs with walking, [ ] Pain in legs at rest, [ ] Pain in legs at night,  [ ] Non-healing ulcer, [ ] Blood clot in vein/DVT,   Pulmonary: [ ] Home oxygen, [ ] Productive cough, [ ] Coughing up blood, [ ] Asthma,  [ ] Wheezing Musculoskeletal:  [ ] Arthritis, [ ] Low back pain, [ ] Joint pain Hematologic: [ ] Easy Bruising, [ ] Anemia; [ ] Hepatitis Gastrointestinal: [ ] Blood in stool, [ ] Gastroesophageal Reflux/heartburn, [ ] Trouble swallowing Urinary: [ ] chronic Kidney disease, [ ] on HD - [ ] MWF or [ ] TTHS, [ ] Burning with urination, [ ] Difficulty urinating Skin: [ ] Rashes, [ ] Wounds Psychological: [ ] Anxiety, [ ] Depression   Social History History  Substance Use Topics  .   Smoking status: Former Smoker -- 1.0 packs/day for 20 years    Types: Cigarettes    Quit date: 07/29/1991  . Smokeless tobacco: Former User    Types: Chew    Quit date: 08/06/1987  . Alcohol Use: No     occasionallly    Family History Family History  Problem Relation Age of Onset  . Hypertension Other   . Cancer Other     No Known Allergies  Current Outpatient Prescriptions  Medication Sig Dispense Refill  . allopurinol (ZYLOPRIM) 300 MG tablet Take 300 mg by mouth daily.        . ALPRAZolam (XANAX) 1 MG tablet Take 0.5-2 mg by mouth 3 (three) times daily. Takes half tablet in the morning and at 430 pm and 2 tablets at bedtime.      . amoxicillin-clavulanate (AUGMENTIN) 875-125 MG per tablet Take 875 mg by mouth 2 (two) times daily at 10 AM and 5 PM. One half AM and one half PM      . atenolol (TENORMIN) 50 MG tablet Take 50 mg by  mouth 2 (two) times daily.       . brimonidine-timolol (COMBIGAN) 0.2-0.5 % ophthalmic solution Place 1 drop into both eyes every 12 (twelve) hours.      . ciprofloxacin-dexamethasone (CIPRODEX) otic suspension Place 4 drops into the right ear 2 (two) times daily. Hole in ear drum      . clopidogrel (PLAVIX) 75 MG tablet Take 75 mg by mouth daily.        . diclofenac sodium (VOLTAREN) 1 % GEL Apply 1 application topically 4 (four) times daily as needed. Applies to both knees as needed for pain      . furosemide (LASIX) 80 MG tablet Take 160 mg by mouth daily.       . glipiZIDE (GLUCOTROL) 5 MG tablet Take 5 mg by mouth 2 (two) times daily before a meal.        . HYDROcodone-acetaminophen (VICODIN ES) 7.5-750 MG per tablet Take 1 tablet by mouth every 6 (six) hours as needed. For pain      . insulin glargine (LANTUS) 100 UNIT/ML injection Inject 5-10 Units into the skin 2 (two) times daily as needed. Per sliding scale in divided doses. May use up to 140 units/day. Usual dose is 5-10 units      . isosorbide mononitrate (IMDUR) 30 MG 24 hr tablet Take 30 mg by mouth daily.      . latanoprost (XALATAN) 0.005 % ophthalmic solution Place 1 drop into both eyes at bedtime.        . metolazone (ZAROXOLYN) 5 MG tablet Take 5 mg by mouth every Monday, Wednesday, and Friday.       . nitroGLYCERIN (NITROSTAT) 0.4 MG SL tablet Place 0.4 mg under the tongue every 5 (five) minutes as needed. For chest pain      . olopatadine (PATANOL) 0.1 % ophthalmic solution Place 1 drop into both eyes 2 (two) times daily.        . omeprazole (PRILOSEC) 20 MG capsule Take 20 mg by mouth 2 (two) times daily.       . potassium chloride SA (K-DUR,KLOR-CON) 20 MEQ tablet Take 60 mEq by mouth 3 (three) times daily.       . simvastatin (ZOCOR) 20 MG tablet Take 20 mg by mouth every evening.       . temazepam (RESTORIL) 30 MG capsule Take 30 mg by mouth at bedtime.           Physical Examination  There were no vitals filed for this  visit.  There is no height or weight on file to calculate BMI.  General:  WDWN in NAD Gait: Normal HEENT: WNL Eyes: Pupils equal Pulmonary: normal non-labored breathing , without Rales, rhonchi,  wheezing Cardiac: RRR, without  Murmurs, rubs or gallops; No carotid bruits Abdomen: soft, NT, no masses Skin: no rashes, ulcers noted Vascular Exam/Pulses: Lower extremity pulses are not palpable due to edema  Extremities without ischemic changes, no Gangrene , no cellulitis; no open wounds;  Musculoskeletal: no muscle wasting or atrophy  Neurologic: A&O X 3; Appropriate Affect ; SENSATION: normal; MOTOR FUNCTION:  moving all extremities equally. Speech is fluent/normal  Non-Invasive Vascular Imaging: ABIs today are 0.68 on the right, 0.71 and biphasic to monophasic on the left which is a slight increase from previous exam in June 2013 when he was 0.61 on the right and 0.57 on the left.  ASSESSMENT/PLAN: Asymptomatic patient with increased claudication/lower extremity pain. Dr. Brabham reviewed the study above with me and has scheduled a aortogram with runoff and possible intervention for next week. The patient's in agreement with this, his questions were encouraged and answered. The patient will followup in clinic per Dr. Brabham after the procedure.  Portia Wisdom ANP  Clinic M.D.: Brabham  

## 2012-05-21 ENCOUNTER — Encounter (HOSPITAL_COMMUNITY): Payer: Self-pay | Admitting: Pharmacy Technician

## 2012-06-01 MED ORDER — SODIUM CHLORIDE 0.9 % IV SOLN
INTRAVENOUS | Status: DC
  Administered 2012-06-02 (×2): 1000 mL via INTRAVENOUS

## 2012-06-02 ENCOUNTER — Telehealth: Payer: Self-pay | Admitting: Surgery

## 2012-06-02 ENCOUNTER — Ambulatory Visit (HOSPITAL_COMMUNITY)
Admission: RE | Admit: 2012-06-02 | Discharge: 2012-06-02 | Disposition: A | Discharge status: Home / Self Care | Payer: Medicare Other | Source: Ambulatory Visit | Attending: Surgery | Admitting: Surgery

## 2012-06-02 ENCOUNTER — Encounter (HOSPITAL_COMMUNITY)
Admission: RE | Disposition: A | Discharge status: Home / Self Care | Payer: Self-pay | Source: Ambulatory Visit | Attending: Surgery

## 2012-06-02 ENCOUNTER — Other Ambulatory Visit: Payer: Self-pay | Admitting: *Deleted

## 2012-06-02 DIAGNOSIS — I70209 Unspecified atherosclerosis of native arteries of extremities, unspecified extremity: Secondary | ICD-10-CM | POA: Insufficient documentation

## 2012-06-02 DIAGNOSIS — I129 Hypertensive chronic kidney disease with stage 1 through stage 4 chronic kidney disease, or unspecified chronic kidney disease: Secondary | ICD-10-CM | POA: Insufficient documentation

## 2012-06-02 DIAGNOSIS — N189 Chronic kidney disease, unspecified: Secondary | ICD-10-CM | POA: Insufficient documentation

## 2012-06-02 DIAGNOSIS — Z48812 Encounter for surgical aftercare following surgery on the circulatory system: Secondary | ICD-10-CM

## 2012-06-02 DIAGNOSIS — I70219 Atherosclerosis of native arteries of extremities with intermittent claudication, unspecified extremity: Secondary | ICD-10-CM

## 2012-06-02 DIAGNOSIS — I739 Peripheral vascular disease, unspecified: Secondary | ICD-10-CM

## 2012-06-02 DIAGNOSIS — E119 Type 2 diabetes mellitus without complications: Secondary | ICD-10-CM | POA: Insufficient documentation

## 2012-06-02 HISTORY — PX: ABDOMINAL AORTAGRAM: SHX5454

## 2012-06-02 LAB — POCT I-STAT, CHEM 8
Calcium, Ion: 1.13 mmol/L (ref 1.13–1.30)
Creatinine, Ser: 1.4 mg/dL — ABNORMAL HIGH (ref 0.50–1.35)
Glucose, Bld: 147 mg/dL — ABNORMAL HIGH (ref 70–99)
HCT: 47 % (ref 39.0–52.0)
Hemoglobin: 16 g/dL (ref 13.0–17.0)
Potassium: 3.4 mEq/L — ABNORMAL LOW (ref 3.5–5.1)

## 2012-06-02 LAB — POCT ACTIVATED CLOTTING TIME: Activated Clotting Time: 204 seconds

## 2012-06-02 SURGERY — ABDOMINAL AORTAGRAM
Anesthesia: LOCAL

## 2012-06-02 MED ORDER — MIDAZOLAM HCL 2 MG/2ML IJ SOLN
INTRAMUSCULAR | Status: AC
  Filled 2012-06-02: qty 2

## 2012-06-02 MED ORDER — ONDANSETRON HCL 4 MG/2ML IJ SOLN: 4.0000 mg | Freq: Four times a day (QID) | INTRAMUSCULAR | Status: DC | PRN

## 2012-06-02 MED ORDER — IPRATROPIUM BROMIDE 0.02 % IN SOLN
0.5000 mg | RESPIRATORY_TRACT | Status: DC
  Administered 2012-06-02: 0.5 mg via RESPIRATORY_TRACT

## 2012-06-02 MED ORDER — MORPHINE SULFATE 10 MG/ML IJ SOLN
2.0000 mg | INTRAMUSCULAR | Status: DC | PRN
  Administered 2012-06-02: 2 mg via INTRAVENOUS

## 2012-06-02 MED ORDER — HYDRALAZINE HCL 20 MG/ML IJ SOLN: 10.0000 mg | INTRAMUSCULAR | Status: DC | PRN

## 2012-06-02 MED ORDER — ALUM & MAG HYDROXIDE-SIMETH 200-200-20 MG/5ML PO SUSP: 15.0000 mL | ORAL | Status: DC | PRN

## 2012-06-02 MED ORDER — VERAPAMIL HCL 2.5 MG/ML IV SOLN
INTRAVENOUS | Status: AC
  Filled 2012-06-02: qty 2

## 2012-06-02 MED ORDER — ACETAMINOPHEN 325 MG PO TABS: 325.0000 mg | ORAL_TABLET | ORAL | Status: DC | PRN

## 2012-06-02 MED ORDER — GUAIFENESIN-DM 100-10 MG/5ML PO SYRP: 15.0000 mL | ORAL_SOLUTION | ORAL | Status: DC | PRN

## 2012-06-02 MED ORDER — MORPHINE SULFATE 2 MG/ML IJ SOLN
INTRAMUSCULAR | Status: AC
  Filled 2012-06-02: qty 1

## 2012-06-02 MED ORDER — HYDRALAZINE HCL 20 MG/ML IJ SOLN
INTRAMUSCULAR | Status: AC
  Filled 2012-06-02: qty 1

## 2012-06-02 MED ORDER — OXYCODONE-ACETAMINOPHEN 5-325 MG PO TABS: 1.0000 | ORAL_TABLET | ORAL | Status: DC | PRN

## 2012-06-02 MED ORDER — DIPHENHYDRAMINE HCL 50 MG/ML IJ SOLN
INTRAMUSCULAR | Status: AC
  Filled 2012-06-02: qty 1

## 2012-06-02 MED ORDER — METHYLPREDNISOLONE SODIUM SUCC 125 MG IJ SOLR
INTRAMUSCULAR | Status: AC
  Filled 2012-06-02: qty 2

## 2012-06-02 MED ORDER — METOPROLOL TARTRATE 1 MG/ML IV SOLN: 2.0000 mg | INTRAVENOUS | Status: DC | PRN

## 2012-06-02 MED ORDER — FUROSEMIDE 10 MG/ML IJ SOLN
INTRAMUSCULAR | Status: AC
  Filled 2012-06-02: qty 4

## 2012-06-02 MED ORDER — LABETALOL HCL 5 MG/ML IV SOLN
10.0000 mg | INTRAVENOUS | Status: DC | PRN
Start: 2012-06-02 — End: 2012-06-02

## 2012-06-02 MED ORDER — SODIUM CHLORIDE 0.9 % IV SOLN: 1.0000 mL/kg/h | INTRAVENOUS | Status: DC

## 2012-06-02 MED ORDER — HEPARIN SODIUM (PORCINE) 1000 UNIT/ML IJ SOLN
INTRAMUSCULAR | Status: AC
  Filled 2012-06-02: qty 1

## 2012-06-02 MED ORDER — NITROGLYCERIN IN D5W 200-5 MCG/ML-% IV SOLN
INTRAVENOUS | Status: AC
  Filled 2012-06-02: qty 250

## 2012-06-02 MED ORDER — FENTANYL CITRATE 0.05 MG/ML IJ SOLN
INTRAMUSCULAR | Status: AC
  Filled 2012-06-02: qty 2

## 2012-06-02 MED ORDER — ACETAMINOPHEN 325 MG RE SUPP: 325.0000 mg | RECTAL | Status: DC | PRN

## 2012-06-02 MED ORDER — PHENOL 1.4 % MT LIQD: 1.0000 | OROMUCOSAL | Status: DC | PRN

## 2012-06-02 NOTE — Telephone Encounter (Addendum)
Message copied by Rosalyn Charters on Tue Jun 02, 2012  1:51 PM ------      Message from: Shanon Rosser      Created: Tue Jun 02, 2012 11:12 AM      Regarding: schedule                   ----- Message -----         From: Melene Plan, RN         Sent: 06/02/2012  10:25 AM           To: Vallarie Mare, LPN, [facility]                        ----- Message -----         From: Nada Libman, MD         Sent: 06/02/2012   9:20 AM           To: Reuel Derby, Melene Plan, RN            06/02/2012, the patient had the following procedures:                   1.  ultrasound access right femoral artery       2.  abdominal aortogram       3.  left lower extremity runoff       4.  third order catheterization       5.  atherectomy and angioplasty, left popliteal artery                  Please schedule the patient to come back to see me in the office in 3 months with a duplex ultrasound and ABI notified patient of fu appt. with vwb on 08-31-12 10 am

## 2012-06-02 NOTE — H&P (View-Only) (Signed)
[facility]  PAD/PVD Office Note  CC: PAD surveillance Referring Physician: Brabham  History of Present Illness: 71 year old male patient of Dr. Myra Gianotti who is status post, Procedure(s) (LRB):  Abdominal angiogram  U/s access R CFA  L LE runoff  Popliteal atherectomy and angioplasty, in June 2013. The patient followed up with Dr. Myra Gianotti in August and had had some relief with his claudication to that point. The patient states today his legs "hurt all the time". The patient has orthopedic issues with both knees, right greater than left and states he cannot have orthopedic surgery due to a history of infection and his orthopedic surgeon has refused to do any further work. The patient states he cannot ambulate do to leg pain that is probably vascular as well as orthopedic in nature.   Past Medical History  Diagnosis Date  . Hypertension   . CVA (cerebral infarction)   . Epilepsy   . Peripheral vascular disease   . Ulcer     left foot  . Cellulitis   . Chronic kidney disease   . Dyslipidemia   . Chronic venous insufficiency   . Sleep apnea   . Morbid obesity   . Diverticulosis   . Benign prostatic hypertrophy   . Glaucoma   . Staphylococcus aureus infection   . Gout   . Depression   . Insomnia   . Coronary artery disease   . CAD (coronary artery disease), native coronary artery   . GERD (gastroesophageal reflux disease)   . Heart murmur   . Myocardial infarction ~ 11/2011    "in ICU @ [facility]; stress-related"  . Angina   . Pneumonia 12/11/11  . Shortness of breath 12/11/11    "mostly w/exertion"  . Type II diabetes mellitus   . Chronic daily headache     "~ all the time"  . Seizures     hx; "don't know from what"  . Arthritis   . CHF (congestive heart failure) May 2013  . Stroke ~ 1983    "little weaker right hand"  . Recent heart attack 12/09/11    Stress heart attack    ROS: [x]  Positive   [ ]  Denies    General: [ ]  Weight loss, [ ]   Fever, [ ]  chills Neurologic: [ ]  Dizziness, [ ]  Blackouts, [ ]  Seizure [ ]  Stroke, [ ]  "Mini stroke", [ ]  Slurred speech, [ ]  Temporary blindness; [ ]  weakness in arms or legs, [ ]  Hoarseness Cardiac: [ ]  Chest pain/pressure, [ ]  Shortness of breath at rest [ ]  Shortness of breath with exertion, [ ]  Atrial fibrillation or irregular heartbeat Vascular: [ ]  Pain in legs with walking, [ ]  Pain in legs at rest, [ ]  Pain in legs at night,  [ ]  Non-healing ulcer, [ ]  Blood clot in vein/DVT,   Pulmonary: [ ]  Home oxygen, [ ]  Productive cough, [ ]  Coughing up blood, [ ]  Asthma,  [ ]  Wheezing Musculoskeletal:  [ ]  Arthritis, [ ]  Low back pain, [ ]  Joint pain Hematologic: [ ]  Easy Bruising, [ ]  Anemia; [ ]  Hepatitis Gastrointestinal: [ ]  Blood in stool, [ ]  Gastroesophageal Reflux/heartburn, [ ]  Trouble swallowing Urinary: [ ]  chronic Kidney disease, [ ]  on HD - [ ]  MWF or [ ]  TTHS, [ ]  Burning with urination, [ ]  Difficulty urinating Skin: [ ]  Rashes, [ ]  Wounds Psychological: [ ]  Anxiety, [ ]  Depression   Social History History  Substance Use Topics  .  Smoking status: Former Smoker -- 1.0 packs/day for 20 years    Types: Cigarettes    Quit date: 07/29/1991  . Smokeless tobacco: Former Neurosurgeon    Types: Chew    Quit date: 08/06/1987  . Alcohol Use: No     occasionallly    Family History Family History  Problem Relation Age of Onset  . Hypertension Other   . Cancer Other     No Known Allergies  Current Outpatient Prescriptions  Medication Sig Dispense Refill  . allopurinol (ZYLOPRIM) 300 MG tablet Take 300 mg by mouth daily.        Marland Kitchen ALPRAZolam (XANAX) 1 MG tablet Take 0.5-2 mg by mouth 3 (three) times daily. Takes half tablet in the morning and at 430 pm and 2 tablets at bedtime.      Marland Kitchen amoxicillin-clavulanate (AUGMENTIN) 875-125 MG per tablet Take 875 mg by mouth 2 (two) times daily at 10 AM and 5 PM. One half AM and one half PM      . atenolol (TENORMIN) 50 MG tablet Take 50 mg by  mouth 2 (two) times daily.       . brimonidine-timolol (COMBIGAN) 0.2-0.5 % ophthalmic solution Place 1 drop into both eyes every 12 (twelve) hours.      . ciprofloxacin-dexamethasone (CIPRODEX) otic suspension Place 4 drops into the right ear 2 (two) times daily. Hole in ear drum      . clopidogrel (PLAVIX) 75 MG tablet Take 75 mg by mouth daily.        . diclofenac sodium (VOLTAREN) 1 % GEL Apply 1 application topically 4 (four) times daily as needed. Applies to both knees as needed for pain      . furosemide (LASIX) 80 MG tablet Take 160 mg by mouth daily.       Marland Kitchen glipiZIDE (GLUCOTROL) 5 MG tablet Take 5 mg by mouth 2 (two) times daily before a meal.        . HYDROcodone-acetaminophen (VICODIN ES) 7.5-750 MG per tablet Take 1 tablet by mouth every 6 (six) hours as needed. For pain      . insulin glargine (LANTUS) 100 UNIT/ML injection Inject 5-10 Units into the skin 2 (two) times daily as needed. Per sliding scale in divided doses. May use up to 140 units/day. Usual dose is 5-10 units      . isosorbide mononitrate (IMDUR) 30 MG 24 hr tablet Take 30 mg by mouth daily.      Marland Kitchen latanoprost (XALATAN) 0.005 % ophthalmic solution Place 1 drop into both eyes at bedtime.        . metolazone (ZAROXOLYN) 5 MG tablet Take 5 mg by mouth every Monday, Wednesday, and Friday.       . nitroGLYCERIN (NITROSTAT) 0.4 MG SL tablet Place 0.4 mg under the tongue every 5 (five) minutes as needed. For chest pain      . olopatadine (PATANOL) 0.1 % ophthalmic solution Place 1 drop into both eyes 2 (two) times daily.        Marland Kitchen omeprazole (PRILOSEC) 20 MG capsule Take 20 mg by mouth 2 (two) times daily.       . potassium chloride SA (K-DUR,KLOR-CON) 20 MEQ tablet Take 60 mEq by mouth 3 (three) times daily.       . simvastatin (ZOCOR) 20 MG tablet Take 20 mg by mouth every evening.       . temazepam (RESTORIL) 30 MG capsule Take 30 mg by mouth at bedtime.  Physical Examination  There were no vitals filed for this  visit.  There is no height or weight on file to calculate BMI.  General:  WDWN in NAD Gait: Normal HEENT: WNL Eyes: Pupils equal Pulmonary: normal non-labored breathing , without Rales, rhonchi,  wheezing Cardiac: RRR, without  Murmurs, rubs or gallops; No carotid bruits Abdomen: soft, NT, no masses Skin: no rashes, ulcers noted Vascular Exam/Pulses: Lower extremity pulses are not palpable due to edema  Extremities without ischemic changes, no Gangrene , no cellulitis; no open wounds;  Musculoskeletal: no muscle wasting or atrophy  Neurologic: A&O X 3; Appropriate Affect ; SENSATION: normal; MOTOR FUNCTION:  moving all extremities equally. Speech is fluent/normal  Non-Invasive Vascular Imaging: ABIs today are 0.68 on the right, 0.71 and biphasic to monophasic on the left which is a slight increase from previous exam in June 2013 when he was 0.61 on the right and 0.57 on the left.  ASSESSMENT/PLAN: Asymptomatic patient with increased claudication/lower extremity pain. Dr. Myra Gianotti reviewed the study above with me and has scheduled a aortogram with runoff and possible intervention for next week. The patient's in agreement with this, his questions were encouraged and answered. The patient will followup in clinic per Dr. Myra Gianotti after the procedure.  Lauree Chandler ANP  Clinic M.D.: Myra Gianotti

## 2012-06-02 NOTE — Op Note (Signed)
[facility] [place]  Patient name: Nicholas [name] MRN: [id number] DOB: 1941-01-23 Sex: male  06/02/2012 Pre-operative Diagnosis: Recurrent stenosis, Left popliteal Post-operative diagnosis:  Same Surgeon:  Jorge Ny Procedure Performed:  1.  ultrasound access right femoral artery  2.  abdominal aortogram  3.  left lower extremity runoff  4.  third order catheterization  5.  atherectomy and angioplasty, left popliteal artery  6.  intra-arterial administration of nitroglycerin  7.  followup x1   Indications:  The patient comes in for followup evaluation of the left popliteal artery. This is previously undergone treatment the atherectomy and angioplasty. This is been fixed for her long-term ulceration of the left foot. He is not an operative candidate  Procedure:  The patient was identified in the holding area and taken to room 8.  The patient was then placed supine on the table and prepped and draped in the usual sterile fashion.  A time out was called.  Ultrasound was used to evaluate the right common femoral artery.  It was patent .  A digital ultrasound image was acquired.  A micropuncture needle was used to access the right common femoral artery under ultrasound guidance.  An 018 wire was advanced without resistance and a micropuncture sheath was placed.  The 018 wire was removed and a benson wire was placed.  The micropuncture sheath was exchanged for a 5 french sheath.  An omniflush catheter was advanced over the wire to the level of L-1.  An abdominal angiogram was obtained.  Next, using the omniflush catheter and a benson wire, the aortic bifurcation was crossed and the catheter was placed into theleft external iliac artery and left runoff was obtained.   Findings:   Aortogram:  The visualized portions of the suprarenal abdominal aorta showed no significant disease. No evidence of renal artery stenosis is identified. There are accessory renal arteries  noted area and approximately 50% stenosis is noted in the right common iliac artery. The left common and external iliac artery are widely patent. The right external iliac artery is widely patent.  Right Lower Extremity:  Not evaluated  Left Lower Extremity:  The left common femoral artery is patent throughout its course as is the profunda femoral artery. The superficial femoral artery is widely patent. Again seen is a recurrent high-grade stenosis within the popliteal artery and the joint space. Three-vessel runoff is visualized down to the ankle. The anterior tibial artery is the dominant vessel across the ankle as the posterior tibial artery occludes.  Intervention:  After the above images were obtained, the decision was made to proceed with intervention. A 6 French 45 cm sheath was advanced into the left common femoral artery. An 014 wire was advanced across the stenosis in the left popliteal artery. The patient was fully heparinized. A wire exchange was performed and a Viper wire was placed. I used a CSI 2.0 classic device to perform atherectomy of the lesion behind the left popliteal artery. This was done at 60, 90, and 120 RPMs. Intra-arterial administration of nitroglycerin was performed with the catheter in the left popliteal artery. I then performed angioplasty of the lesion treated with atherectomy. A 5.5 balloon was taken to 4 atmospheres and held up for 1 minute. A followup study revealed a small dissection. I then inserted a 5 x 30 balloon. This was inflated to 5 atmospheres and held up for 3 minutes. A completion study was performed which revealed that the dissection was no longer  present. Runoff remained similar. At this point the decision was made to terminate the procedure. The catheters and wires were removed. The sheath was withdrawn to the right external iliac artery. The patient will be taken to the holding area for sheath removal once his coagulation profile corrects.  Impression:  #1   successful atherectomy and angioplasty of the left popliteal artery using a CSI 2.0 classic device and a 5.5 balloon. After the initial inflation a dissection was noted. A 5.0 balloon was then inserted and held for 3 minutes and the dissection resolved.    Juleen China, M.D. [facility] Office: [phone] Pager:  [phone]

## 2012-06-02 NOTE — Interval H&P Note (Signed)
History and Physical Interval Note:  06/02/2012 7:21 AM  Nicholas [name]  has presented today for surgery, with the diagnosis of PVD  The various methods of treatment have been discussed with the patient and family. After consideration of risks, benefits and other options for treatment, the patient has consented to  Procedure(s) (LRB) with comments: ABDOMINAL AORTAGRAM (N/A) as a surgical intervention .  The patient's history has been reviewed, patient examined, no change in status, stable for surgery.  I have reviewed the patient's chart and labs.  Questions were answered to the patient's satisfaction.     Nicholas [name], V. WELL[facility]

## 2012-06-16 ENCOUNTER — Encounter (HOSPITAL_BASED_OUTPATIENT_CLINIC_OR_DEPARTMENT_OTHER): Payer: Medicare Other

## 2012-06-22 ENCOUNTER — Ambulatory Visit: Payer: Medicare Other | Admitting: Surgery

## 2012-06-23 ENCOUNTER — Encounter (HOSPITAL_BASED_OUTPATIENT_CLINIC_OR_DEPARTMENT_OTHER): Payer: Medicare Other | Attending: General Surgery

## 2012-06-23 DIAGNOSIS — L8992 Pressure ulcer of unspecified site, stage 2: Secondary | ICD-10-CM | POA: Insufficient documentation

## 2012-06-23 DIAGNOSIS — L89609 Pressure ulcer of unspecified heel, unspecified stage: Secondary | ICD-10-CM | POA: Insufficient documentation

## 2012-06-23 DIAGNOSIS — L89309 Pressure ulcer of unspecified buttock, unspecified stage: Secondary | ICD-10-CM | POA: Insufficient documentation

## 2012-06-23 DIAGNOSIS — L97509 Non-pressure chronic ulcer of other part of unspecified foot with unspecified severity: Secondary | ICD-10-CM | POA: Insufficient documentation

## 2012-06-23 DIAGNOSIS — E1169 Type 2 diabetes mellitus with other specified complication: Secondary | ICD-10-CM | POA: Insufficient documentation

## 2012-06-23 DIAGNOSIS — L84 Corns and callosities: Secondary | ICD-10-CM | POA: Insufficient documentation

## 2012-07-21 ENCOUNTER — Ambulatory Visit (HOSPITAL_COMMUNITY)
Admission: RE | Admit: 2012-07-21 | Discharge: 2012-07-21 | Disposition: A | Discharge status: Home / Self Care | Payer: Medicare Other | Source: Ambulatory Visit | Attending: General Surgery | Admitting: General Surgery

## 2012-07-21 ENCOUNTER — Other Ambulatory Visit (HOSPITAL_BASED_OUTPATIENT_CLINIC_OR_DEPARTMENT_OTHER): Payer: Self-pay | Admitting: General Surgery

## 2012-07-21 ENCOUNTER — Encounter (HOSPITAL_BASED_OUTPATIENT_CLINIC_OR_DEPARTMENT_OTHER): Payer: Medicare Other | Attending: General Surgery

## 2012-07-21 DIAGNOSIS — L97509 Non-pressure chronic ulcer of other part of unspecified foot with unspecified severity: Secondary | ICD-10-CM | POA: Insufficient documentation

## 2012-07-21 DIAGNOSIS — Z22322 Carrier or suspected carrier of Methicillin resistant Staphylococcus aureus: Secondary | ICD-10-CM

## 2012-07-21 DIAGNOSIS — E1169 Type 2 diabetes mellitus with other specified complication: Secondary | ICD-10-CM | POA: Insufficient documentation

## 2012-07-21 DIAGNOSIS — L89309 Pressure ulcer of unspecified buttock, unspecified stage: Secondary | ICD-10-CM | POA: Insufficient documentation

## 2012-07-21 DIAGNOSIS — T148XXA Other injury of unspecified body region, initial encounter: Secondary | ICD-10-CM

## 2012-07-21 DIAGNOSIS — M810 Age-related osteoporosis without current pathological fracture: Secondary | ICD-10-CM | POA: Insufficient documentation

## 2012-07-21 DIAGNOSIS — L989 Disorder of the skin and subcutaneous tissue, unspecified: Secondary | ICD-10-CM | POA: Insufficient documentation

## 2012-07-21 DIAGNOSIS — L97409 Non-pressure chronic ulcer of unspecified heel and midfoot with unspecified severity: Secondary | ICD-10-CM | POA: Insufficient documentation

## 2012-07-21 DIAGNOSIS — L8993 Pressure ulcer of unspecified site, stage 3: Secondary | ICD-10-CM | POA: Insufficient documentation

## 2012-07-21 DIAGNOSIS — M899 Disorder of bone, unspecified: Secondary | ICD-10-CM | POA: Insufficient documentation

## 2012-07-21 HISTORY — PX: OTHER SURGICAL HISTORY: SHX169

## 2012-07-21 HISTORY — DX: Carrier or suspected carrier of methicillin resistant Staphylococcus aureus: Z22.322

## 2012-08-25 ENCOUNTER — Encounter (HOSPITAL_BASED_OUTPATIENT_CLINIC_OR_DEPARTMENT_OTHER): Payer: Medicare Other | Attending: General Surgery

## 2012-08-28 ENCOUNTER — Encounter: Payer: Self-pay | Admitting: Surgery

## 2012-08-31 ENCOUNTER — Ambulatory Visit (INDEPENDENT_AMBULATORY_CARE_PROVIDER_SITE_OTHER): Payer: Medicare Other | Admitting: Surgery

## 2012-08-31 ENCOUNTER — Encounter (INDEPENDENT_AMBULATORY_CARE_PROVIDER_SITE_OTHER): Payer: Medicare Other | Admitting: *Deleted

## 2012-08-31 ENCOUNTER — Other Ambulatory Visit: Payer: Self-pay | Admitting: *Deleted

## 2012-08-31 ENCOUNTER — Encounter: Payer: Self-pay | Admitting: Surgery

## 2012-08-31 VITALS — BP 175/79 | HR 57 | Resp 18 | Ht 70.0 in | Wt 275.0 lb

## 2012-08-31 DIAGNOSIS — Z48812 Encounter for surgical aftercare following surgery on the circulatory system: Secondary | ICD-10-CM

## 2012-08-31 DIAGNOSIS — I739 Peripheral vascular disease, unspecified: Secondary | ICD-10-CM

## 2012-08-31 DIAGNOSIS — I7025 Atherosclerosis of native arteries of other extremities with ulceration: Secondary | ICD-10-CM | POA: Insufficient documentation

## 2012-08-31 DIAGNOSIS — M79609 Pain in unspecified limb: Secondary | ICD-10-CM

## 2012-08-31 DIAGNOSIS — IMO0001 Reserved for inherently not codable concepts without codable children: Secondary | ICD-10-CM | POA: Insufficient documentation

## 2012-08-31 DIAGNOSIS — L98499 Non-pressure chronic ulcer of skin of other sites with unspecified severity: Secondary | ICD-10-CM

## 2012-08-31 NOTE — Addendum Note (Signed)
Addended by: Sharee Pimple on: 08/31/2012 02:16 PM   Modules accepted: Orders

## 2012-08-31 NOTE — Progress Notes (Signed)
[facility] [place]   Patient name: Nicholas [name] MRN: [id number] DOB: 1941-06-07 Sex: male     Chief Complaint  Patient presents with  . PVD    3 month f/up  with vascular Lab study.  C/O Right knee, Leg and heel painful 1-2 months.  Pt. has MRSA right heel.    HISTORY OF PRESENT ILLNESS: The patient is back today for followup. He is well known to me having undergone multiple interventions on his left lower extremity for a very resistant popliteal stenosis. His most recent intervention was warranted 06/02/2012. He has had multiple atherectomies performed. I have been reluctant to place a stent due to the location of the stenosis, behind the knee. He has significant issues with lower extremity swelling. He is wheelchair-bound essentially. He reports having a right heel ulcer which was debrided and the wound clinic which is very tender.  Past Medical History  Diagnosis Date  . Hypertension   . CVA (cerebral infarction)   . Epilepsy   . Peripheral vascular disease   . Ulcer     left foot  . Cellulitis   . Chronic kidney disease   . Dyslipidemia   . Chronic venous insufficiency   . Sleep apnea   . Morbid obesity   . Diverticulosis   . Benign prostatic hypertrophy   . Glaucoma   . Staphylococcus aureus infection   . Gout   . Depression   . Insomnia   . Coronary artery disease   . CAD (coronary artery disease), native coronary artery   . GERD (gastroesophageal reflux disease)   . Heart murmur   . Myocardial infarction ~ 11/2011    "in ICU @ [facility]; stress-related"  . Angina   . Pneumonia 12/11/11  . Shortness of breath 12/11/11    "mostly w/exertion"  . Type II diabetes mellitus   . Chronic daily headache     "~ all the time"  . Seizures     hx; "don't know from what"  . Arthritis   . CHF (congestive heart failure) May 2013  . Stroke ~ 1983    "little weaker right hand"  . Recent heart attack 12/09/11    Stress heart attack  . MRSA  (methicillin resistant staph aureus) culture positive Dec. 17, 2013     right heel infection    Past Surgical History  Procedure Date  . Appendectomy   . Lumbar laminectomy   . Mastoid debridement   . Knee and ankle surgery 1979    "caved in in a ditch; messed up legs"; right  . Cholecystectomy   . Back surgery     "5 or 6"  . Peripheral arterial stent graft     "don't remember which side"  . Coronary artery bypass graft 1983; 1992    "CABG X 5; CABG X 8"  . Lower extremity angiogram 01/21/2012    Procedure: LOWER EXTREMITY ANGIOGRAM;  Surgeon: Nada Libman, MD;  Location: [facility] OR;  Service: Vascular;  Laterality: Bilateral;  WITH POSSIBLE INTERVENTION  . Eye surgery     "laser; both eyes"  . Eye surgery 05/08/2012    Cataract Left eye  . Heel bx Dec. 17, 2013    Right Heel Bx.      History   Social History  . Marital Status: Married    Spouse Name: N/A    Number of Children: N/A  . Years of Education: N/A   Occupational History  . Not  on file.   Social History Main Topics  . Smoking status: Former Smoker -- 1.0 packs/day for 20 years    Types: Cigarettes    Quit date: 07/29/1991  . Smokeless tobacco: Former Neurosurgeon    Types: Chew    Quit date: 08/06/1987  . Alcohol Use: No     Comment: occasionallly  . Drug Use: No  . Sexually Active: Not Currently   Other Topics Concern  . Not on file   Social History Narrative  . No narrative on file    Family History  Problem Relation Age of Onset  . Hypertension Other   . Cancer Other   . Cancer Sister   . Deep vein thrombosis Brother   . Heart disease Brother   . Deep vein thrombosis Brother   . Heart disease Brother   . Cancer Sister     Allergies as of 08/31/2012 - Review Complete 08/31/2012  Allergen Reaction Noted  . Contrast media (iodinated diagnostic agents) Other (See Comments) 06/02/2012    Current Outpatient Prescriptions on File Prior to Visit  Medication Sig Dispense Refill  . allopurinol  (ZYLOPRIM) 300 MG tablet Take 300 mg by mouth daily.        Marland Kitchen ALPRAZolam (XANAX) 1 MG tablet Take 0.5-2 mg by mouth 3 (three) times daily. Takes half tablet in the morning and at 430 pm and 2 tablets at bedtime.      Marland Kitchen amoxicillin-clavulanate (AUGMENTIN) 875-125 MG per tablet Take 875 mg by mouth 2 (two) times daily at 10 AM and 5 PM. One half AM and one half PM      . atenolol (TENORMIN) 50 MG tablet Take 50 mg by mouth 2 (two) times daily.       . brimonidine-timolol (COMBIGAN) 0.2-0.5 % ophthalmic solution Place 1 drop into both eyes every 12 (twelve) hours.      . ciprofloxacin-dexamethasone (CIPRODEX) otic suspension Place 4 drops into the right ear 2 (two) times daily. Hole in ear drum      . clopidogrel (PLAVIX) 75 MG tablet Take 75 mg by mouth daily.        . diclofenac sodium (VOLTAREN) 1 % GEL Apply 1 application topically 4 (four) times daily as needed. Applies to both knees as needed for pain      . furosemide (LASIX) 80 MG tablet Take 160 mg by mouth daily.       Marland Kitchen glipiZIDE (GLUCOTROL) 5 MG tablet Take 5 mg by mouth 2 (two) times daily before a meal.        . HYDROcodone-acetaminophen (VICODIN ES) 7.5-750 MG per tablet Take 1 tablet by mouth every 6 (six) hours as needed. For pain      . insulin glargine (LANTUS) 100 UNIT/ML injection Inject 5-10 Units into the skin 2 (two) times daily as needed. Per sliding scale in divided doses. May use up to 140 units/day. Usual dose is 5-10 units      . isosorbide mononitrate (IMDUR) 30 MG 24 hr tablet Take 30 mg by mouth daily.      Marland Kitchen latanoprost (XALATAN) 0.005 % ophthalmic solution Place 1 drop into both eyes at bedtime.        . metolazone (ZAROXOLYN) 5 MG tablet Take 5 mg by mouth every Monday, Wednesday, and Friday.       . nitroGLYCERIN (NITROSTAT) 0.4 MG SL tablet Place 0.4 mg under the tongue every 5 (five) minutes as needed. For chest pain      . olopatadine (  PATANOL) 0.1 % ophthalmic solution Place 1 drop into both eyes 2 (two) times  daily.        Marland Kitchen omeprazole (PRILOSEC) 20 MG capsule Take 20 mg by mouth 2 (two) times daily.       . potassium chloride SA (K-DUR,KLOR-CON) 20 MEQ tablet Take 60 mEq by mouth 3 (three) times daily.       . simvastatin (ZOCOR) 20 MG tablet Take 20 mg by mouth every evening.       . temazepam (RESTORIL) 30 MG capsule Take 30 mg by mouth at bedtime.          REVIEW OF SYSTEMS: Posture pain in his legs with walking or lying flat. Positive for swelling in his leg. Otherwise all systems have remained unchanged from prior visit PHYSICAL EXAMINATION:   Vital signs are BP 175/79  Pulse 57  Resp 18  Ht 5\' 10"  (1.778 m)  Wt 275 lb (124.739 kg)  BMI 39.46 kg/m2  SpO2 97% General: The patient appears their stated age. HEENT:  No gross abnormalities Pulmonary:  Non labored breathing Musculoskeletal: There are no major deformities. Neurologic: No focal weakness or paresthesias are detected, Skin: 5 mm x 5 mm wound on the medial aspect of the right heel. No surrounding erythema. There is a good granulation tissue base. There are 24 mm open areas on the anterior side of the left leg. Psychiatric: The patient has normal affect. Cardiovascular: There is a regular rate and rhythm without significant murmur appreciated. Significant lower extremity pitting edema   Diagnostic Studies Ultrasound today shows the stenosis in the popliteal area to be at 300 cm/s. ABI on the left is 0.71 on the right is 0.68  Assessment: Peripheral vascular disease with ulceration Plan: The patient has a new left leg wound which I feel is most likely secondary to the massive edema he has his leg. I'm going to place him in a Unnas boot today, and I will have home health do twice weekly applications of a 3 layer Profore. The right heel ulcer appears to be healing so I will let him continue on with treatment at the wound center. With regards to his popliteal stenosis. Even though he has elevated velocities today, these are less  than what they have been in the past. I have elected to repeat his ultrasound in 3 months.  Jorge Ny, M.D. [facility] [place] Office: [phone] Pager:  [phone]

## 2012-09-22 DIAGNOSIS — I5032 Chronic diastolic (congestive) heart failure: Secondary | ICD-10-CM | POA: Insufficient documentation

## 2012-10-27 ENCOUNTER — Encounter (HOSPITAL_BASED_OUTPATIENT_CLINIC_OR_DEPARTMENT_OTHER): Payer: Medicare Other | Attending: General Surgery

## 2012-10-27 DIAGNOSIS — L8992 Pressure ulcer of unspecified site, stage 2: Secondary | ICD-10-CM | POA: Insufficient documentation

## 2012-10-27 DIAGNOSIS — L89609 Pressure ulcer of unspecified heel, unspecified stage: Secondary | ICD-10-CM | POA: Insufficient documentation

## 2012-10-27 DIAGNOSIS — L84 Corns and callosities: Secondary | ICD-10-CM | POA: Insufficient documentation

## 2012-10-27 DIAGNOSIS — E1169 Type 2 diabetes mellitus with other specified complication: Secondary | ICD-10-CM | POA: Insufficient documentation

## 2012-10-27 DIAGNOSIS — L97509 Non-pressure chronic ulcer of other part of unspecified foot with unspecified severity: Secondary | ICD-10-CM | POA: Insufficient documentation

## 2012-11-28 ENCOUNTER — Encounter (HOSPITAL_COMMUNITY): Payer: Self-pay

## 2012-11-28 ENCOUNTER — Emergency Department (HOSPITAL_COMMUNITY)
Admission: EM | Admit: 2012-11-28 | Discharge: 2012-11-29 | Disposition: A | Discharge status: Home / Self Care | Payer: Medicare Other | Attending: Emergency Medicine | Admitting: Emergency Medicine

## 2012-11-28 DIAGNOSIS — Z8719 Personal history of other diseases of the digestive system: Secondary | ICD-10-CM | POA: Insufficient documentation

## 2012-11-28 DIAGNOSIS — E785 Hyperlipidemia, unspecified: Secondary | ICD-10-CM | POA: Insufficient documentation

## 2012-11-28 DIAGNOSIS — E119 Type 2 diabetes mellitus without complications: Secondary | ICD-10-CM | POA: Insufficient documentation

## 2012-11-28 DIAGNOSIS — M129 Arthropathy, unspecified: Secondary | ICD-10-CM | POA: Insufficient documentation

## 2012-11-28 DIAGNOSIS — Z7902 Long term (current) use of antithrombotics/antiplatelets: Secondary | ICD-10-CM | POA: Insufficient documentation

## 2012-11-28 DIAGNOSIS — K219 Gastro-esophageal reflux disease without esophagitis: Secondary | ICD-10-CM | POA: Insufficient documentation

## 2012-11-28 DIAGNOSIS — I252 Old myocardial infarction: Secondary | ICD-10-CM | POA: Insufficient documentation

## 2012-11-28 DIAGNOSIS — M7989 Other specified soft tissue disorders: Secondary | ICD-10-CM | POA: Insufficient documentation

## 2012-11-28 DIAGNOSIS — G4700 Insomnia, unspecified: Secondary | ICD-10-CM | POA: Insufficient documentation

## 2012-11-28 DIAGNOSIS — Z794 Long term (current) use of insulin: Secondary | ICD-10-CM | POA: Insufficient documentation

## 2012-11-28 DIAGNOSIS — I129 Hypertensive chronic kidney disease with stage 1 through stage 4 chronic kidney disease, or unspecified chronic kidney disease: Secondary | ICD-10-CM | POA: Insufficient documentation

## 2012-11-28 DIAGNOSIS — Z87448 Personal history of other diseases of urinary system: Secondary | ICD-10-CM | POA: Insufficient documentation

## 2012-11-28 DIAGNOSIS — R630 Anorexia: Secondary | ICD-10-CM | POA: Insufficient documentation

## 2012-11-28 DIAGNOSIS — Z8669 Personal history of other diseases of the nervous system and sense organs: Secondary | ICD-10-CM | POA: Insufficient documentation

## 2012-11-28 DIAGNOSIS — Z87891 Personal history of nicotine dependence: Secondary | ICD-10-CM | POA: Insufficient documentation

## 2012-11-28 DIAGNOSIS — Z8619 Personal history of other infectious and parasitic diseases: Secondary | ICD-10-CM | POA: Insufficient documentation

## 2012-11-28 DIAGNOSIS — I2510 Atherosclerotic heart disease of native coronary artery without angina pectoris: Secondary | ICD-10-CM | POA: Insufficient documentation

## 2012-11-28 DIAGNOSIS — Z8679 Personal history of other diseases of the circulatory system: Secondary | ICD-10-CM | POA: Insufficient documentation

## 2012-11-28 DIAGNOSIS — N189 Chronic kidney disease, unspecified: Secondary | ICD-10-CM | POA: Insufficient documentation

## 2012-11-28 DIAGNOSIS — R112 Nausea with vomiting, unspecified: Secondary | ICD-10-CM | POA: Insufficient documentation

## 2012-11-28 DIAGNOSIS — R011 Cardiac murmur, unspecified: Secondary | ICD-10-CM | POA: Insufficient documentation

## 2012-11-28 DIAGNOSIS — M109 Gout, unspecified: Secondary | ICD-10-CM | POA: Insufficient documentation

## 2012-11-28 DIAGNOSIS — G8929 Other chronic pain: Secondary | ICD-10-CM | POA: Insufficient documentation

## 2012-11-28 DIAGNOSIS — I509 Heart failure, unspecified: Secondary | ICD-10-CM | POA: Insufficient documentation

## 2012-11-28 DIAGNOSIS — Z8614 Personal history of Methicillin resistant Staphylococcus aureus infection: Secondary | ICD-10-CM | POA: Insufficient documentation

## 2012-11-28 DIAGNOSIS — Z8673 Personal history of transient ischemic attack (TIA), and cerebral infarction without residual deficits: Secondary | ICD-10-CM | POA: Insufficient documentation

## 2012-11-28 DIAGNOSIS — Z8701 Personal history of pneumonia (recurrent): Secondary | ICD-10-CM | POA: Insufficient documentation

## 2012-11-28 DIAGNOSIS — Z9889 Other specified postprocedural states: Secondary | ICD-10-CM | POA: Insufficient documentation

## 2012-11-28 DIAGNOSIS — Z79899 Other long term (current) drug therapy: Secondary | ICD-10-CM | POA: Insufficient documentation

## 2012-11-28 DIAGNOSIS — Z951 Presence of aortocoronary bypass graft: Secondary | ICD-10-CM | POA: Insufficient documentation

## 2012-11-28 DIAGNOSIS — M549 Dorsalgia, unspecified: Secondary | ICD-10-CM | POA: Insufficient documentation

## 2012-11-28 DIAGNOSIS — Z8709 Personal history of other diseases of the respiratory system: Secondary | ICD-10-CM | POA: Insufficient documentation

## 2012-11-28 LAB — POCT I-STAT, CHEM 8
HCT: 50 % (ref 39.0–52.0)
Hemoglobin: 17 g/dL (ref 13.0–17.0)
Potassium: 4.6 mEq/L (ref 3.5–5.1)
Sodium: 139 mEq/L (ref 135–145)
TCO2: 28 mmol/L (ref 0–100)

## 2012-11-28 LAB — CBC WITH DIFFERENTIAL/PLATELET
Basophils Relative: 0 % (ref 0–1)
HCT: 47.1 % (ref 39.0–52.0)
Hemoglobin: 15.3 g/dL (ref 13.0–17.0)
Lymphs Abs: 2.9 10*3/uL (ref 0.7–4.0)
MCH: 29.7 pg (ref 26.0–34.0)
MCHC: 32.5 g/dL (ref 30.0–36.0)
Monocytes Absolute: 0.7 10*3/uL (ref 0.1–1.0)
Monocytes Relative: 7 % (ref 3–12)
Neutro Abs: 6 10*3/uL (ref 1.7–7.7)
Neutrophils Relative %: 60 % (ref 43–77)
RBC: 5.15 MIL/uL (ref 4.22–5.81)

## 2012-11-28 MED ORDER — SODIUM CHLORIDE 0.9 % IV SOLN
Freq: Once | INTRAVENOUS | Status: AC
  Administered 2012-11-28: 22:00:00 via INTRAVENOUS

## 2012-11-28 MED ORDER — ONDANSETRON HCL 4 MG/2ML IJ SOLN
4.0000 mg | Freq: Once | INTRAMUSCULAR | Status: DC
  Filled 2012-11-28: qty 2

## 2012-11-28 NOTE — ED Provider Notes (Addendum)
History     CSN: [id number]  Arrival date & time 11/28/12  2023   First MD Initiated Contact with Patient 11/28/12 2034      Chief Complaint  Patient presents with  . Leg Pain    (Consider location/radiation/quality/duration/timing/severity/associated sxs/prior treatment) HPI Comments: Patient with history of peripheral vascular disease.  He's had multiple bypasses and stents placed in his left leg.  Presents with bilateral lower, leg edema.  He was seen by home health on a 3 times a week basis, where they were wrapping his lower legs in Ace bandages.  He is being discharged from that service.  His wife has been wrapping his legs.  He was also placed on antibiotics 3, weeks, ago, for a presumed infection in his left lower leg.  He taken one half tablet of Augmentin daily.  Since that time.  He has not felt well complaining of nausea, vomiting, no appetite.  His wife reports that his blood sugars have been labile.  They have not contacted his primary care physician.  Patient is a 72 y.o. male presenting with leg pain. The history is provided by the patient and the spouse.  Leg Pain Location:  Leg Time since incident:  3 weeks Injury: no   Leg location:  R lower leg and L lower leg Pain details:    Quality:  Aching   Radiates to:  Does not radiate   Severity:  Moderate   Onset quality:  Unable to specify   Timing:  Unable to specify   Progression:  Worsening Chronicity:  Chronic Dislocation: no   Prior injury to area:  No Relieved by:  Nothing Ineffective treatments:  NSAIDs, arthritis medication, rest and compression Associated symptoms: no fever     Past Medical History  Diagnosis Date  . Hypertension   . CVA (cerebral infarction)   . Epilepsy   . Peripheral vascular disease   . Ulcer     left foot  . Cellulitis   . Chronic kidney disease   . Dyslipidemia   . Chronic venous insufficiency   . Sleep apnea   . Morbid obesity   . Diverticulosis   . Benign prostatic  hypertrophy   . Glaucoma   . Staphylococcus aureus infection   . Gout   . Depression   . Insomnia   . Coronary artery disease   . CAD (coronary artery disease), native coronary artery   . GERD (gastroesophageal reflux disease)   . Heart murmur   . Myocardial infarction ~ 11/2011    "in ICU @ [facility]; stress-related"  . Angina   . Pneumonia 12/11/11  . Shortness of breath 12/11/11    "mostly w/exertion"  . Type II diabetes mellitus   . Chronic daily headache     "~ all the time"  . Seizures     hx; "don't know from what"  . Arthritis   . CHF (congestive heart failure) May 2013  . Stroke ~ 1983    "little weaker right hand"  . Recent heart attack 12/09/11    Stress heart attack  . MRSA (methicillin resistant staph aureus) culture positive Dec. 17, 2013     right heel infection    Past Surgical History  Procedure Laterality Date  . Appendectomy    . Lumbar laminectomy    . Mastoid debridement    . Knee and ankle surgery  1979    "caved in in a ditch; messed up legs"; right  . Cholecystectomy    .  Back surgery      "5 or 6"  . Peripheral arterial stent graft      "don't remember which side"  . Coronary artery bypass graft  1983; 1992    "CABG X 5; CABG X 8"  . Lower extremity angiogram  01/21/2012    Procedure: LOWER EXTREMITY ANGIOGRAM;  Surgeon: Nada Libman, MD;  Location: [facility] OR;  Service: Vascular;  Laterality: Bilateral;  WITH POSSIBLE INTERVENTION  . Eye surgery      "laser; both eyes"  . Eye surgery  05/08/2012    Cataract Left eye  . Heel bx  Dec. 17, 2013    Right Heel Bx.      Family History  Problem Relation Age of Onset  . Hypertension Other   . Cancer Other   . Cancer Sister   . Deep vein thrombosis Brother   . Heart disease Brother   . Deep vein thrombosis Brother   . Heart disease Brother   . Cancer Sister     History  Substance Use Topics  . Smoking status: Former Smoker -- 1.00 packs/day for 20 years    Types: Cigarettes    Quit date:  07/29/1991  . Smokeless tobacco: Former Neurosurgeon    Types: Chew    Quit date: 08/06/1987  . Alcohol Use: No     Comment: occasionallly      Review of Systems  Unable to perform ROS Constitutional: Negative for fever and chills.  HENT: Negative for sore throat and rhinorrhea.   Respiratory: Negative for shortness of breath.   Cardiovascular: Positive for leg swelling. Negative for chest pain.  Gastrointestinal: Positive for nausea. Negative for diarrhea and constipation.  Genitourinary: Negative for dysuria.  Musculoskeletal: Negative for joint swelling.  Skin: Negative for wound.  Neurological: Negative for dizziness, weakness and headaches.  All other systems reviewed and are negative.    Allergies  Contrast media  Home Medications   Current Outpatient Rx  Name  Route  Sig  Dispense  Refill  . allopurinol (ZYLOPRIM) 300 MG tablet   Oral   Take 300 mg by mouth daily.           Marland Kitchen ALPRAZolam (XANAX) 1 MG tablet   Oral   Take 0.5-2 mg by mouth 3 (three) times daily. Takes half tablet in the morning and at 430 pm and 2 tablets at bedtime.         Marland Kitchen amoxicillin-clavulanate (AUGMENTIN) 875-125 MG per tablet   Oral   Take 875 mg by mouth 2 (two) times daily at 10 AM and 5 PM. One half AM and one half PM         . atenolol (TENORMIN) 50 MG tablet   Oral   Take 50 mg by mouth 2 (two) times daily.          . brimonidine-timolol (COMBIGAN) 0.2-0.5 % ophthalmic solution   Both Eyes   Place 1 drop into both eyes every 12 (twelve) hours.         . clopidogrel (PLAVIX) 75 MG tablet   Oral   Take 75 mg by mouth daily.           . diclofenac sodium (VOLTAREN) 1 % GEL   Topical   Apply 1 application topically 4 (four) times daily as needed. Applies to both knees as needed for pain         . furosemide (LASIX) 80 MG tablet   Oral   Take 160 mg by  mouth daily.          Marland Kitchen glipiZIDE (GLUCOTROL) 5 MG tablet   Oral   Take 5 mg by mouth 2 (two) times daily before  a meal.           . HYDROcodone-acetaminophen (VICODIN ES) 7.5-750 MG per tablet   Oral   Take 1 tablet by mouth every 6 (six) hours as needed. For pain         . insulin glargine (LANTUS) 100 UNIT/ML injection   Subcutaneous   Inject 5-10 Units into the skin 2 (two) times daily as needed. Per sliding scale in divided doses. May use up to 140 units/day. Usual dose is 5-10 units         . isosorbide mononitrate (IMDUR) 30 MG 24 hr tablet   Oral   Take 30 mg by mouth daily.         Marland Kitchen latanoprost (XALATAN) 0.005 % ophthalmic solution   Both Eyes   Place 1 drop into both eyes at bedtime.           . metolazone (ZAROXOLYN) 5 MG tablet   Oral   Take 5 mg by mouth every Monday, Wednesday, and Friday.          . nitroGLYCERIN (NITROSTAT) 0.4 MG SL tablet   Sublingual   Place 0.4 mg under the tongue every 5 (five) minutes as needed. For chest pain         . olopatadine (PATANOL) 0.1 % ophthalmic solution   Both Eyes   Place 1 drop into both eyes 2 (two) times daily.           Marland Kitchen omeprazole (PRILOSEC) 20 MG capsule   Oral   Take 20 mg by mouth 2 (two) times daily.          Marland Kitchen perphenazine (TRILAFON) 8 MG tablet      At bedtime as needed.         . potassium chloride SA (K-DUR,KLOR-CON) 20 MEQ tablet   Oral   Take 60 mEq by mouth 3 (three) times daily.          . simvastatin (ZOCOR) 20 MG tablet   Oral   Take 20 mg by mouth every evening.          . temazepam (RESTORIL) 30 MG capsule   Oral   Take 30 mg by mouth at bedtime.          Marland Kitchen oxyCODONE-acetaminophen (PERCOCET/ROXICET) 5-325 MG per tablet   Oral   Take 1 tablet by mouth every 6 (six) hours as needed for pain.   20 tablet   0     BP 141/63  Pulse 54  Temp(Src) 98.5 F (36.9 C) (Oral)  Resp 14  Ht 5\' 10"  (1.778 m)  SpO2 95%  Physical Exam  Nursing note and vitals reviewed. Constitutional: He appears well-developed and well-nourished.  HENT:  Head: Normocephalic.  Eyes: Pupils  are equal, round, and reactive to light.  Neck: Normal range of motion.  Cardiovascular: Normal rate and regular rhythm.   Pulmonary/Chest: Effort normal.  Musculoskeletal: He exhibits edema and tenderness.  Tonic venous stasis, swelling of right lower leg, with chronic skin changes    ED Course  Procedures (including critical care time)  Labs Reviewed  URINALYSIS, ROUTINE W REFLEX MICROSCOPIC - Abnormal; Notable for the following:    Protein, ur 30 (*)    Leukocytes, UA SMALL (*)    All other components within normal limits  POCT I-STAT, CHEM 8 - Abnormal; Notable for the following:    Glucose, Bld 183 (*)    All other components within normal limits  CBC WITH DIFFERENTIAL  URINE MICROSCOPIC-ADD ON   No results found.   1. Chronic leg pain, unspecified laterality       MDM   Patient's labs reviewed.  There is no indication of infection.  Patient will be discharged home with instructions to followup with his primary care physician        Arman Filter, NP 11/29/12 0551  Arman Filter, NP 12/16/12 2146

## 2012-11-28 NOTE — ED Notes (Signed)
Per EMS pt has not been feeling well, voice pain in R leg. FSBS 225, pt difficult ambulating.

## 2012-11-28 NOTE — ED Notes (Signed)
Bed:WA07<BR> Expected date:<BR> Expected time:<BR> Means of arrival:<BR> Comments:<BR> EMS

## 2012-11-29 LAB — URINALYSIS, ROUTINE W REFLEX MICROSCOPIC
Nitrite: NEGATIVE
Protein, ur: 30 mg/dL — AB
Specific Gravity, Urine: 1.017 (ref 1.005–1.030)
Urobilinogen, UA: 0.2 mg/dL (ref 0.0–1.0)

## 2012-11-29 LAB — URINE MICROSCOPIC-ADD ON

## 2012-11-29 MED ORDER — OXYCODONE-ACETAMINOPHEN 5-325 MG PO TABS: 1.0000 | ORAL_TABLET | Freq: Four times a day (QID) | ORAL | Status: DC | PRN

## 2012-11-29 MED ORDER — OXYCODONE-ACETAMINOPHEN 5-325 MG PO TABS
2.0000 | ORAL_TABLET | Freq: Once | ORAL | Status: AC
  Administered 2012-11-29: 2 via ORAL
  Filled 2012-11-29: qty 2

## 2012-11-29 NOTE — ED Provider Notes (Addendum)
Medical screening examination/treatment/procedure(s) were performed by non-physician practitioner and as supervising physician I was immediately available for consultation/collaboration.    Celene Kras, MD 11/29/12 1534  Celene Kras, MD 12/16/12 [phone]

## 2012-11-30 ENCOUNTER — Ambulatory Visit: Payer: Medicare Other | Admitting: Neurosurgery

## 2012-12-18 ENCOUNTER — Encounter: Payer: Self-pay | Admitting: Surgery

## 2012-12-21 ENCOUNTER — Encounter: Payer: Self-pay | Admitting: Surgery

## 2012-12-21 ENCOUNTER — Encounter (INDEPENDENT_AMBULATORY_CARE_PROVIDER_SITE_OTHER): Payer: Medicare Other | Admitting: *Deleted

## 2012-12-21 ENCOUNTER — Ambulatory Visit (INDEPENDENT_AMBULATORY_CARE_PROVIDER_SITE_OTHER): Payer: Medicare Other | Admitting: Surgery

## 2012-12-21 VITALS — BP 163/45 | HR 52 | Ht 70.0 in | Wt 270.0 lb

## 2012-12-21 DIAGNOSIS — I739 Peripheral vascular disease, unspecified: Secondary | ICD-10-CM

## 2012-12-21 DIAGNOSIS — Z48812 Encounter for surgical aftercare following surgery on the circulatory system: Secondary | ICD-10-CM | POA: Insufficient documentation

## 2012-12-21 NOTE — Progress Notes (Signed)
[facility] [place]   Patient name: Nicholas [name] MRN: [id number] DOB: 12-01-1940 Sex: male     Chief Complaint  Patient presents with  . PVD    3 mo f/u with vascular Lab study. pt c/o not being able to use his Rt leg at all, also states he has severe pain in that leg    HISTORY OF PRESENT ILLNESS: The patient is back today for followup. He has a chronic left foot wound and peripheral vascular disease. He has undergone multiple interventions on his left popliteal and anterior tibial artery. He is not a candidate for surgical bypass. Unfortunately, he has gone on to heal his wound. He also states that his swelling is significantly improved. He still complains of right knee pain. An injection was recently performed and has helped with his ambulation although he is still limited to getting around mostly with a wheelchair.  Past Medical History  Diagnosis Date  . Hypertension   . CVA (cerebral infarction)   . Epilepsy   . Peripheral vascular disease   . Ulcer     left foot  . Cellulitis   . Chronic kidney disease   . Dyslipidemia   . Chronic venous insufficiency   . Sleep apnea   . Morbid obesity   . Diverticulosis   . Benign prostatic hypertrophy   . Glaucoma   . Staphylococcus aureus infection   . Gout   . Depression   . Insomnia   . Coronary artery disease   . CAD (coronary artery disease), native coronary artery   . GERD (gastroesophageal reflux disease)   . Heart murmur   . Myocardial infarction ~ 11/2011    "in ICU @ [facility]; stress-related"  . Angina   . Pneumonia 12/11/11  . Shortness of breath 12/11/11    "mostly w/exertion"  . Type II diabetes mellitus   . Chronic daily headache     "~ all the time"  . Seizures     hx; "don't know from what"  . Arthritis   . CHF (congestive heart failure) May 2013  . Stroke ~ 1983    "little weaker right hand"  . Recent heart attack 12/09/11    Stress heart attack  . MRSA (methicillin resistant  staph aureus) culture positive Dec. 17, 2013     right heel infection    Past Surgical History  Procedure Laterality Date  . Appendectomy    . Lumbar laminectomy    . Mastoid debridement    . Knee and ankle surgery  1979    "caved in in a ditch; messed up legs"; right  . Cholecystectomy    . Back surgery      "5 or 6"  . Peripheral arterial stent graft      "don't remember which side"  . Coronary artery bypass graft  1983; 1992    "CABG X 5; CABG X 8"  . Lower extremity angiogram  01/21/2012    Procedure: LOWER EXTREMITY ANGIOGRAM;  Surgeon: Nada Libman, MD;  Location: [facility] OR;  Service: Vascular;  Laterality: Bilateral;  WITH POSSIBLE INTERVENTION  . Eye surgery      "laser; both eyes"  . Eye surgery  05/08/2012    Cataract Left eye  . Heel bx  Dec. 17, 2013    Right Heel Bx.      History   Social History  . Marital Status: Married    Spouse Name: N/A    Number of  Children: N/A  . Years of Education: N/A   Occupational History  . Not on file.   Social History Main Topics  . Smoking status: Former Smoker -- 1.00 packs/day for 20 years    Types: Cigarettes    Quit date: 07/29/1991  . Smokeless tobacco: Former Neurosurgeon    Types: Chew    Quit date: 08/06/1987  . Alcohol Use: No     Comment: occasionallly  . Drug Use: No  . Sexually Active: Not Currently   Other Topics Concern  . Not on file   Social History Narrative  . No narrative on file    Family History  Problem Relation Age of Onset  . Hypertension Other   . Cancer Other   . Cancer Sister   . Deep vein thrombosis Brother   . Heart disease Brother   . Deep vein thrombosis Brother   . Heart disease Brother   . Cancer Sister     Allergies as of 12/21/2012 - Review Complete 12/21/2012  Allergen Reaction Noted  . Contrast media (iodinated diagnostic agents) Other (See Comments) 06/02/2012    Current Outpatient Prescriptions on File Prior to Visit  Medication Sig Dispense Refill  . allopurinol  (ZYLOPRIM) 300 MG tablet Take 300 mg by mouth daily.        Marland Kitchen ALPRAZolam (XANAX) 1 MG tablet Take 0.5-2 mg by mouth 3 (three) times daily. Takes half tablet in the morning and at 430 pm and 2 tablets at bedtime.      Marland Kitchen atenolol (TENORMIN) 50 MG tablet Take 50 mg by mouth 2 (two) times daily.       . brimonidine-timolol (COMBIGAN) 0.2-0.5 % ophthalmic solution Place 1 drop into both eyes every 12 (twelve) hours.      . clopidogrel (PLAVIX) 75 MG tablet Take 75 mg by mouth daily.        . diclofenac sodium (VOLTAREN) 1 % GEL Apply 1 application topically 4 (four) times daily as needed. Applies to both knees as needed for pain      . furosemide (LASIX) 80 MG tablet Take 160 mg by mouth daily.       Marland Kitchen glipiZIDE (GLUCOTROL) 5 MG tablet Take 5 mg by mouth 2 (two) times daily before a meal.        . HYDROcodone-acetaminophen (VICODIN ES) 7.5-750 MG per tablet Take 1 tablet by mouth every 6 (six) hours as needed. For pain      . insulin glargine (LANTUS) 100 UNIT/ML injection Inject 5-10 Units into the skin 2 (two) times daily as needed. Per sliding scale in divided doses. May use up to 140 units/day. Usual dose is 5-10 units      . isosorbide mononitrate (IMDUR) 30 MG 24 hr tablet Take 30 mg by mouth daily.      Marland Kitchen latanoprost (XALATAN) 0.005 % ophthalmic solution Place 1 drop into both eyes at bedtime.        . metolazone (ZAROXOLYN) 5 MG tablet Take 5 mg by mouth every Monday, Wednesday, and Friday.       . nitroGLYCERIN (NITROSTAT) 0.4 MG SL tablet Place 0.4 mg under the tongue every 5 (five) minutes as needed. For chest pain      . olopatadine (PATANOL) 0.1 % ophthalmic solution Place 1 drop into both eyes 2 (two) times daily.        Marland Kitchen omeprazole (PRILOSEC) 20 MG capsule Take 20 mg by mouth 2 (two) times daily.       Marland Kitchen  perphenazine (TRILAFON) 8 MG tablet At bedtime as needed.      . potassium chloride SA (K-DUR,KLOR-CON) 20 MEQ tablet Take 60 mEq by mouth 3 (three) times daily.       . simvastatin  (ZOCOR) 20 MG tablet Take 20 mg by mouth every evening.       . temazepam (RESTORIL) 30 MG capsule Take 30 mg by mouth at bedtime.       Marland Kitchen amoxicillin-clavulanate (AUGMENTIN) 875-125 MG per tablet Take 875 mg by mouth 2 (two) times daily at 10 AM and 5 PM. One half AM and one half PM      . oxyCODONE-acetaminophen (PERCOCET/ROXICET) 5-325 MG per tablet Take 1 tablet by mouth every 6 (six) hours as needed for pain.  20 tablet  0   No current facility-administered medications on file prior to visit.     REVIEW OF SYSTEMS: Please see history of present illness. No chest pain stable source of breath no ulcers, they have all healed  PHYSICAL EXAMINATION:   Vital signs are BP 163/45  Pulse 52  Ht 5\' 10"  (1.778 m)  Wt 270 lb (122.471 kg)  BMI 38.74 kg/m2  SpO2 98% General: The patient appears their stated age. HEENT:  No gross abnormalities Pulmonary:  Non labored breathing Abdomen: Soft and non-tender Musculoskeletal: There are no major deformities. Neurologic: No focal weakness or paresthesias are detected, Skin: There are no ulcer or rashes noted. Right plantar ulcer has completely healed Psychiatric: The patient has normal affect. Cardiovascular: There is a regular rate and rhythm without significant murmur appreciated. Severe edema bilateral lower extremity   Diagnostic Studies Ultrasound today shows an ABI of 0.8 911.63 on the right. Maximum velocities in the previously treated area are 2 91 cm/s  Assessment: Peripheral vascular disease with ulceration, now healed, a left leg Plan: The patient's ulcer on his left leg has now healed. In addition, the edema to both lower extremities is dramatically better. His ultrasound today shows stable velocity profile with good ABIs. I recommended continued surveillance ultrasound, the next study will be in 6 months  V. Charlena Cross, M.D. [facility] [place] Office: [phone] Pager:  [phone]

## 2012-12-22 NOTE — Addendum Note (Signed)
Addended by: Sharee Pimple on: 12/22/2012 09:07 AM   Modules accepted: Orders

## 2013-04-30 ENCOUNTER — Encounter (HOSPITAL_COMMUNITY): Payer: Self-pay

## 2013-04-30 ENCOUNTER — Emergency Department (HOSPITAL_COMMUNITY)
Admission: EM | Admit: 2013-04-30 | Discharge: 2013-04-30 | Disposition: A | Discharge status: Home / Self Care | Payer: Medicare Other | Source: Home / Self Care | Attending: Emergency Medicine | Admitting: Emergency Medicine

## 2013-04-30 ENCOUNTER — Emergency Department (HOSPITAL_COMMUNITY)
Admission: EM | Admit: 2013-04-30 | Discharge: 2013-04-30 | Disposition: A | Discharge status: Home / Self Care | Payer: Medicare Other | Attending: Emergency Medicine | Admitting: Emergency Medicine

## 2013-04-30 ENCOUNTER — Encounter (HOSPITAL_COMMUNITY): Payer: Self-pay | Admitting: Emergency Medicine

## 2013-04-30 DIAGNOSIS — N390 Urinary tract infection, site not specified: Secondary | ICD-10-CM

## 2013-04-30 DIAGNOSIS — Z951 Presence of aortocoronary bypass graft: Secondary | ICD-10-CM | POA: Insufficient documentation

## 2013-04-30 DIAGNOSIS — Z8639 Personal history of other endocrine, nutritional and metabolic disease: Secondary | ICD-10-CM | POA: Insufficient documentation

## 2013-04-30 DIAGNOSIS — Z8679 Personal history of other diseases of the circulatory system: Secondary | ICD-10-CM | POA: Insufficient documentation

## 2013-04-30 DIAGNOSIS — F329 Major depressive disorder, single episode, unspecified: Secondary | ICD-10-CM | POA: Insufficient documentation

## 2013-04-30 DIAGNOSIS — Z87448 Personal history of other diseases of urinary system: Secondary | ICD-10-CM | POA: Insufficient documentation

## 2013-04-30 DIAGNOSIS — Z8719 Personal history of other diseases of the digestive system: Secondary | ICD-10-CM | POA: Insufficient documentation

## 2013-04-30 DIAGNOSIS — I509 Heart failure, unspecified: Secondary | ICD-10-CM | POA: Insufficient documentation

## 2013-04-30 DIAGNOSIS — R319 Hematuria, unspecified: Secondary | ICD-10-CM | POA: Insufficient documentation

## 2013-04-30 DIAGNOSIS — Z872 Personal history of diseases of the skin and subcutaneous tissue: Secondary | ICD-10-CM | POA: Insufficient documentation

## 2013-04-30 DIAGNOSIS — I252 Old myocardial infarction: Secondary | ICD-10-CM | POA: Insufficient documentation

## 2013-04-30 DIAGNOSIS — I129 Hypertensive chronic kidney disease with stage 1 through stage 4 chronic kidney disease, or unspecified chronic kidney disease: Secondary | ICD-10-CM | POA: Insufficient documentation

## 2013-04-30 DIAGNOSIS — Z79899 Other long term (current) drug therapy: Secondary | ICD-10-CM | POA: Insufficient documentation

## 2013-04-30 DIAGNOSIS — Z8673 Personal history of transient ischemic attack (TIA), and cerebral infarction without residual deficits: Secondary | ICD-10-CM | POA: Insufficient documentation

## 2013-04-30 DIAGNOSIS — E785 Hyperlipidemia, unspecified: Secondary | ICD-10-CM | POA: Insufficient documentation

## 2013-04-30 DIAGNOSIS — K219 Gastro-esophageal reflux disease without esophagitis: Secondary | ICD-10-CM | POA: Insufficient documentation

## 2013-04-30 DIAGNOSIS — E1129 Type 2 diabetes mellitus with other diabetic kidney complication: Secondary | ICD-10-CM | POA: Insufficient documentation

## 2013-04-30 DIAGNOSIS — R310 Gross hematuria: Secondary | ICD-10-CM

## 2013-04-30 DIAGNOSIS — Z862 Personal history of diseases of the blood and blood-forming organs and certain disorders involving the immune mechanism: Secondary | ICD-10-CM | POA: Insufficient documentation

## 2013-04-30 DIAGNOSIS — Z8739 Personal history of other diseases of the musculoskeletal system and connective tissue: Secondary | ICD-10-CM | POA: Insufficient documentation

## 2013-04-30 DIAGNOSIS — N189 Chronic kidney disease, unspecified: Secondary | ICD-10-CM | POA: Insufficient documentation

## 2013-04-30 DIAGNOSIS — I2510 Atherosclerotic heart disease of native coronary artery without angina pectoris: Secondary | ICD-10-CM | POA: Insufficient documentation

## 2013-04-30 DIAGNOSIS — F3289 Other specified depressive episodes: Secondary | ICD-10-CM | POA: Insufficient documentation

## 2013-04-30 DIAGNOSIS — R609 Edema, unspecified: Secondary | ICD-10-CM | POA: Insufficient documentation

## 2013-04-30 DIAGNOSIS — Z87891 Personal history of nicotine dependence: Secondary | ICD-10-CM | POA: Insufficient documentation

## 2013-04-30 DIAGNOSIS — Z8614 Personal history of Methicillin resistant Staphylococcus aureus infection: Secondary | ICD-10-CM | POA: Insufficient documentation

## 2013-04-30 DIAGNOSIS — Z8619 Personal history of other infectious and parasitic diseases: Secondary | ICD-10-CM | POA: Insufficient documentation

## 2013-04-30 DIAGNOSIS — Z8669 Personal history of other diseases of the nervous system and sense organs: Secondary | ICD-10-CM | POA: Insufficient documentation

## 2013-04-30 DIAGNOSIS — Z8701 Personal history of pneumonia (recurrent): Secondary | ICD-10-CM | POA: Insufficient documentation

## 2013-04-30 DIAGNOSIS — Z794 Long term (current) use of insulin: Secondary | ICD-10-CM | POA: Insufficient documentation

## 2013-04-30 DIAGNOSIS — M7989 Other specified soft tissue disorders: Secondary | ICD-10-CM | POA: Insufficient documentation

## 2013-04-30 DIAGNOSIS — R109 Unspecified abdominal pain: Secondary | ICD-10-CM | POA: Insufficient documentation

## 2013-04-30 LAB — URINALYSIS, ROUTINE W REFLEX MICROSCOPIC
Ketones, ur: 40 mg/dL — AB
Nitrite: POSITIVE — AB
Protein, ur: >300 mg/dL — AB
Specific Gravity, Urine: 1.032 — ABNORMAL HIGH (ref 1.005–1.030)
Urobilinogen, UA: 1 mg/dL (ref 0.0–1.0)
pH: 5 (ref 5.0–8.0)
pH: 5 (ref 5.0–8.0)

## 2013-04-30 LAB — BASIC METABOLIC PANEL
BUN: 16 mg/dL (ref 6–23)
Creatinine, Ser: 1.07 mg/dL (ref 0.50–1.35)
GFR calc Af Amer: 79 mL/min — ABNORMAL LOW (ref >90)
GFR calc non Af Amer: 68 mL/min — ABNORMAL LOW (ref >90)
Potassium: 4.8 mEq/L (ref 3.5–5.1)

## 2013-04-30 LAB — CBC WITH DIFFERENTIAL/PLATELET
Basophils Relative: 0 % (ref 0–1)
Eosinophils Absolute: 0.3 10*3/uL (ref 0.0–0.7)
Hemoglobin: 14.3 g/dL (ref 13.0–17.0)
MCH: 29.4 pg (ref 26.0–34.0)
MCHC: 32 g/dL (ref 30.0–36.0)
Monocytes Absolute: 0.8 10*3/uL (ref 0.1–1.0)
Monocytes Relative: 8 % (ref 3–12)
Neutrophils Relative %: 65 % (ref 43–77)
RDW: 14.8 % (ref 11.5–15.5)

## 2013-04-30 LAB — URINE MICROSCOPIC-ADD ON

## 2013-04-30 MED ORDER — CEPHALEXIN 500 MG PO CAPS: 500.0000 mg | ORAL_CAPSULE | Freq: Four times a day (QID) | ORAL | Status: DC

## 2013-04-30 MED ORDER — CEPHALEXIN 500 MG PO CAPS
500.0000 mg | ORAL_CAPSULE | Freq: Once | ORAL | Status: AC
  Administered 2013-04-30: 500 mg via ORAL
  Filled 2013-04-30: qty 1

## 2013-04-30 NOTE — ED Notes (Signed)
Pt given d/c instructions on how to use catheter and leg bag. Pt verbalized understanding.

## 2013-04-30 NOTE — ED Provider Notes (Signed)
CSN: [id number]     Arrival date & time 04/30/13  0341 History   First MD Initiated Contact with Patient 04/30/13 [phone]     Chief Complaint  Patient presents with  . Hematuria   (Consider location/radiation/quality/duration/timing/severity/associated sxs/prior Treatment) HPI 72 year old male presents to emergency room via EMS with complaint of hematuria since this afternoon.  He reports since this evening, he has had difficulties urinating, due to passing blood clots.  Patient thinks he may have slept on his privates wrong today, causing the bleeding.  No prior history of hematuria.  He has some slight lower abdominal pain since not being able to urinate.  No fevers no chills.  He denies recent dysuria.  Patient denies history of bladder or prostate problems.  He has been seen by urology in the remote past, but does not remember the reason.  Patient also complains of chronic pain and swelling to bilateral lower legs, ongoing for last 9 years.  Past Medical History  Diagnosis Date  . Hypertension   . CVA (cerebral infarction)   . Epilepsy   . Peripheral vascular disease   . Ulcer     left foot  . Cellulitis   . Chronic kidney disease   . Dyslipidemia   . Chronic venous insufficiency   . Sleep apnea   . Morbid obesity   . Diverticulosis   . Benign prostatic hypertrophy   . Glaucoma   . Staphylococcus aureus infection   . Gout   . Depression   . Insomnia   . Coronary artery disease   . CAD (coronary artery disease), native coronary artery   . GERD (gastroesophageal reflux disease)   . Heart murmur   . Myocardial infarction ~ 11/2011    "in ICU @ [facility]; stress-related"  . Angina   . Pneumonia 12/11/11  . Shortness of breath 12/11/11    "mostly w/exertion"  . Type II diabetes mellitus   . Chronic daily headache     "~ all the time"  . Seizures     hx; "don't know from what"  . Arthritis   . CHF (congestive heart failure) May 2013  . Stroke ~ 1983    "little weaker right  hand"  . Recent heart attack 12/09/11    Stress heart attack  . MRSA (methicillin resistant staph aureus) culture positive Dec. 17, 2013     right heel infection   Past Surgical History  Procedure Laterality Date  . Appendectomy    . Lumbar laminectomy    . Mastoid debridement    . Knee and ankle surgery  1979    "caved in in a ditch; messed up legs"; right  . Cholecystectomy    . Back surgery      "5 or 6"  . Peripheral arterial stent graft      "don't remember which side"  . Coronary artery bypass graft  1983; 1992    "CABG X 5; CABG X 8"  . Lower extremity angiogram  01/21/2012    Procedure: LOWER EXTREMITY ANGIOGRAM;  Surgeon: Nada Libman, MD;  Location: [facility] OR;  Service: Vascular;  Laterality: Bilateral;  WITH POSSIBLE INTERVENTION  . Eye surgery      "laser; both eyes"  . Eye surgery  05/08/2012    Cataract Left eye  . Heel bx  Dec. 17, 2013    Right Heel Bx.     Family History  Problem Relation Age of Onset  . Hypertension Other   . Cancer  Other   . Cancer Sister   . Deep vein thrombosis Brother   . Heart disease Brother   . Deep vein thrombosis Brother   . Heart disease Brother   . Cancer Sister    History  Substance Use Topics  . Smoking status: Former Smoker -- 1.00 packs/day for 20 years    Types: Cigarettes    Quit date: 07/29/1991  . Smokeless tobacco: Former Neurosurgeon    Types: Chew    Quit date: 08/06/1987  . Alcohol Use: No     Comment: occasionallly    Review of Systems  See History of Present Illness; otherwise all other systems are reviewed and negative  Allergies  Contrast media  Home Medications   Current Outpatient Rx  Name  Route  Sig  Dispense  Refill  . allopurinol (ZYLOPRIM) 300 MG tablet   Oral   Take 300 mg by mouth daily.           Marland Kitchen ALPRAZolam (XANAX) 1 MG tablet   Oral   Take 0.5-2 mg by mouth 3 (three) times daily. Takes half tablet in the morning and at 430 pm and 2 tablets at bedtime.         Marland Kitchen atenolol (TENORMIN)  50 MG tablet   Oral   Take 50 mg by mouth 2 (two) times daily.          . brimonidine-timolol (COMBIGAN) 0.2-0.5 % ophthalmic solution   Both Eyes   Place 1 drop into both eyes every 12 (twelve) hours.         . clopidogrel (PLAVIX) 75 MG tablet   Oral   Take 75 mg by mouth daily.           . diclofenac sodium (VOLTAREN) 1 % GEL   Topical   Apply 1 application topically 4 (four) times daily as needed. Applies to both knees as needed for pain         . furosemide (LASIX) 80 MG tablet   Oral   Take 160 mg by mouth daily.          Marland Kitchen glipiZIDE (GLUCOTROL) 5 MG tablet   Oral   Take 5 mg by mouth 2 (two) times daily before a meal.           . HYDROcodone-acetaminophen (VICODIN ES) 7.5-750 MG per tablet   Oral   Take 1 tablet by mouth every 6 (six) hours as needed. For pain         . insulin glargine (LANTUS) 100 UNIT/ML injection   Subcutaneous   Inject 5-10 Units into the skin 2 (two) times daily as needed. Per sliding scale in divided doses. May use up to 140 units/day. Usual dose is 5-10 units         . isosorbide mononitrate (IMDUR) 30 MG 24 hr tablet   Oral   Take 30 mg by mouth daily.         Marland Kitchen latanoprost (XALATAN) 0.005 % ophthalmic solution   Both Eyes   Place 1 drop into both eyes at bedtime.           . metolazone (ZAROXOLYN) 5 MG tablet   Oral   Take 5 mg by mouth every Monday, Wednesday, and Friday.          . nitroGLYCERIN (NITROSTAT) 0.4 MG SL tablet   Sublingual   Place 0.4 mg under the tongue every 5 (five) minutes as needed. For chest pain         .  olopatadine (PATANOL) 0.1 % ophthalmic solution   Both Eyes   Place 1 drop into both eyes 2 (two) times daily.           Marland Kitchen omeprazole (PRILOSEC) 20 MG capsule   Oral   Take 20 mg by mouth 2 (two) times daily.          Marland Kitchen oxyCODONE-acetaminophen (PERCOCET/ROXICET) 5-325 MG per tablet   Oral   Take 1 tablet by mouth every 6 (six) hours as needed for pain.   20 tablet   0    . perphenazine (TRILAFON) 8 MG tablet   Oral   Take 8 mg by mouth at bedtime as needed (for nausea).          . potassium chloride SA (K-DUR,KLOR-CON) 20 MEQ tablet   Oral   Take 60 mEq by mouth 3 (three) times daily.          . simvastatin (ZOCOR) 20 MG tablet   Oral   Take 20 mg by mouth every evening.          . temazepam (RESTORIL) 30 MG capsule   Oral   Take 30 mg by mouth at bedtime.           BP 141/72  Pulse 72  Temp(Src) 98.9 F (37.2 C) (Oral)  Resp 18  SpO2 95% Physical Exam  Nursing note and vitals reviewed. Constitutional: He is oriented to person, place, and time. He appears well-developed and well-nourished. No distress.  Morbidly obese  HENT:  Head: Normocephalic and atraumatic.  Nose: Nose normal.  Mouth/Throat: Oropharynx is clear and moist.  Neck: Normal range of motion. Neck supple. No JVD present. No tracheal deviation present. No thyromegaly present.  Pulmonary/Chest: Effort normal and breath sounds normal. No stridor. No respiratory distress. He has no wheezes. He has no rales. He exhibits no tenderness.  Abdominal: Soft. Bowel sounds are normal. He exhibits no distension and no mass. There is tenderness (mild tenderness suprapubic and right lower quadrant). There is no rebound and no guarding.  Genitourinary: No penile tenderness.  Blood at the meatus  Musculoskeletal: Normal range of motion. He exhibits edema (4+ edema bilaterally) and tenderness (Diffuse tenderness to bilateral lower extremities).  Lymphadenopathy:    He has no cervical adenopathy.  Neurological: He is alert and oriented to person, place, and time. He exhibits normal muscle tone. Coordination normal.  Skin: Skin is warm and dry. No rash noted. No erythema. No pallor.    ED Course  Procedures (including critical care time) Labs Review Labs Reviewed  URINALYSIS, ROUTINE W REFLEX MICROSCOPIC - Abnormal; Notable for the following:    Color, Urine RED (*)    APPearance  TURBID (*)    Glucose, UA 100 (*)    Hgb urine dipstick LARGE (*)    Bilirubin Urine LARGE (*)    Ketones, ur 40 (*)    Protein, ur >300 (*)    Nitrite POSITIVE (*)    Leukocytes, UA LARGE (*)    All other components within normal limits  BASIC METABOLIC PANEL - Abnormal; Notable for the following:    Glucose, Bld 136 (*)    GFR calc non Af Amer 68 (*)    GFR calc Af Amer 79 (*)    All other components within normal limits  CBC WITH DIFFERENTIAL  PROTIME-INR  URINE MICROSCOPIC-ADD ON   Imaging Review No results found.  MDM   1. Hematuria, gross    72 year old male with gross hematuria.  Will send urine for culture, will not treat at this time.  Patient is feeling better, abdominal pain, resolved after placement of the Foley.  Unclear etiology for his gross hematuria, differential includes infection, bladder irritation, cancer.  He has been instructed.  He will need to closely followup with urology clinic.  Will leave Foley catheter in place until followed up by urology.  Patient is not on blood thinners aside from Plavix.  His BUN and creatinine are within normal range.  He has no anemia noted.   Olivia Mackie, MD 04/30/13 [phone]

## 2013-04-30 NOTE — ED Notes (Signed)
Pain and swelling in legs are per pt norm.

## 2013-04-30 NOTE — ED Notes (Signed)
Patient reports that he was seen at [facility] for hematuria and this AM he had decreased flow in the foley catheter. Patient  Stated urine began flowing again at 1100 today. Patient has gross hematuria. Patient denies any pain.

## 2013-04-30 NOTE — ED Notes (Signed)
Per ems-- pt from home with c/o of blood in urine that started last night. Also pain in bilateral legs that started at the same time. Pt reports this has been happening over past 9 yrs. Pt reports difficulty with urination as well. Hx or MRSA in legs.

## 2013-04-30 NOTE — ED Provider Notes (Signed)
  Medical screening examination/treatment/procedure(s) were performed by non-physician practitioner and as supervising physician I was immediately available for consultation/collaboration.   Gerhard Munch, MD 04/30/13 [phone]

## 2013-04-30 NOTE — ED Provider Notes (Signed)
CSN: [id number]     Arrival date & time 04/30/13  1517 History   First MD Initiated Contact with Patient 04/30/13 1932     Chief Complaint  Patient presents with  . Hematuria   (Consider location/radiation/quality/duration/timing/severity/associated sxs/prior Treatment) Patient is a 72 y.o. male presenting with hematuria. The history is provided by the patient and medical records. No language interpreter was used.  Hematuria This is a recurrent problem. The current episode started yesterday. The problem occurs constantly. The problem has been gradually improving. Pertinent negatives include no abdominal pain, chills, fever or vomiting.    Past Medical History  Diagnosis Date  . Hypertension   . CVA (cerebral infarction)   . Epilepsy   . Peripheral vascular disease   . Ulcer     left foot  . Cellulitis   . Chronic kidney disease   . Dyslipidemia   . Chronic venous insufficiency   . Sleep apnea   . Morbid obesity   . Diverticulosis   . Benign prostatic hypertrophy   . Glaucoma   . Staphylococcus aureus infection   . Gout   . Depression   . Insomnia   . Coronary artery disease   . CAD (coronary artery disease), native coronary artery   . GERD (gastroesophageal reflux disease)   . Heart murmur   . Myocardial infarction ~ 11/2011    "in ICU @ [facility]; stress-related"  . Angina   . Pneumonia 12/11/11  . Shortness of breath 12/11/11    "mostly w/exertion"  . Type II diabetes mellitus   . Chronic daily headache     "~ all the time"  . Seizures     hx; "don't know from what"  . Arthritis   . CHF (congestive heart failure) May 2013  . Stroke ~ 1983    "little weaker right hand"  . Recent heart attack 12/09/11    Stress heart attack  . MRSA (methicillin resistant staph aureus) culture positive Dec. 17, 2013     right heel infection   Past Surgical History  Procedure Laterality Date  . Appendectomy    . Lumbar laminectomy    . Mastoid debridement    . Knee and ankle  surgery  1979    "caved in in a ditch; messed up legs"; right  . Cholecystectomy    . Back surgery      "5 or 6"  . Peripheral arterial stent graft      "don't remember which side"  . Coronary artery bypass graft  1983; 1992    "CABG X 5; CABG X 8"  . Lower extremity angiogram  01/21/2012    Procedure: LOWER EXTREMITY ANGIOGRAM;  Surgeon: Nada Libman, MD;  Location: [facility] OR;  Service: Vascular;  Laterality: Bilateral;  WITH POSSIBLE INTERVENTION  . Eye surgery      "laser; both eyes"  . Eye surgery  05/08/2012    Cataract Left eye  . Heel bx  Dec. 17, 2013    Right Heel Bx.     Family History  Problem Relation Age of Onset  . Hypertension Other   . Cancer Other   . Cancer Sister   . Deep vein thrombosis Brother   . Heart disease Brother   . Deep vein thrombosis Brother   . Heart disease Brother   . Cancer Sister    History  Substance Use Topics  . Smoking status: Former Smoker -- 1.00 packs/day for 20 years    Types: Cigarettes  Quit date: 07/29/1991  . Smokeless tobacco: Former Neurosurgeon    Types: Chew    Quit date: 08/06/1987  . Alcohol Use: No     Comment: occasionallly    Review of Systems  Constitutional: Negative for fever and chills.  Cardiovascular: Positive for leg swelling.       Chronic lower extremity edema  Gastrointestinal: Negative for vomiting and abdominal pain.  Genitourinary: Positive for hematuria.  All other systems reviewed and are negative.    Allergies  Contrast media  Home Medications   Current Outpatient Rx  Name  Route  Sig  Dispense  Refill  . allopurinol (ZYLOPRIM) 300 MG tablet   Oral   Take 300 mg by mouth daily.           Marland Kitchen ALPRAZolam (XANAX) 1 MG tablet   Oral   Take 0.5-2 mg by mouth 3 (three) times daily. Takes half tablet in the morning and at 430 pm and 2 tablets at bedtime.         Marland Kitchen atenolol (TENORMIN) 50 MG tablet   Oral   Take 50 mg by mouth 2 (two) times daily.          . brimonidine-timolol (COMBIGAN)  0.2-0.5 % ophthalmic solution   Both Eyes   Place 1 drop into both eyes every 12 (twelve) hours.         . clopidogrel (PLAVIX) 75 MG tablet   Oral   Take 75 mg by mouth daily.           . diclofenac sodium (VOLTAREN) 1 % GEL   Topical   Apply 1 application topically 4 (four) times daily as needed. Applies to both knees as needed for pain         . furosemide (LASIX) 80 MG tablet   Oral   Take 160 mg by mouth daily.          Marland Kitchen glipiZIDE (GLUCOTROL) 5 MG tablet   Oral   Take 5 mg by mouth 2 (two) times daily before a meal.           . insulin glargine (LANTUS) 100 UNIT/ML injection   Subcutaneous   Inject 5-10 Units into the skin 2 (two) times daily as needed. Per sliding scale in divided doses. May use up to 140 units/day. Usual dose is 5-10 units         . isosorbide mononitrate (IMDUR) 30 MG 24 hr tablet   Oral   Take 30 mg by mouth daily.         Marland Kitchen latanoprost (XALATAN) 0.005 % ophthalmic solution   Both Eyes   Place 1 drop into both eyes at bedtime.           . metolazone (ZAROXOLYN) 5 MG tablet   Oral   Take 5 mg by mouth every Monday, Wednesday, and Friday.          Marland Kitchen olopatadine (PATANOL) 0.1 % ophthalmic solution   Both Eyes   Place 1 drop into both eyes 2 (two) times daily.           Marland Kitchen omeprazole (PRILOSEC) 20 MG capsule   Oral   Take 20 mg by mouth 2 (two) times daily.          Marland Kitchen oxyCODONE-acetaminophen (PERCOCET/ROXICET) 5-325 MG per tablet   Oral   Take 1 tablet by mouth every 6 (six) hours as needed for pain.   20 tablet   0   . perphenazine (TRILAFON) 8  MG tablet   Oral   Take 8 mg by mouth at bedtime as needed (for nausea).          . potassium chloride SA (K-DUR,KLOR-CON) 20 MEQ tablet   Oral   Take 60 mEq by mouth 3 (three) times daily.          . simvastatin (ZOCOR) 20 MG tablet   Oral   Take 20 mg by mouth every evening.          . temazepam (RESTORIL) 30 MG capsule   Oral   Take 30 mg by mouth at  bedtime.          Marland Kitchen HYDROcodone-acetaminophen (VICODIN ES) 7.5-750 MG per tablet   Oral   Take 1 tablet by mouth every 6 (six) hours as needed. For pain         . nitroGLYCERIN (NITROSTAT) 0.4 MG SL tablet   Sublingual   Place 0.4 mg under the tongue every 5 (five) minutes as needed. For chest pain          BP 124/58  Pulse 59  Temp(Src) 98 F (36.7 C) (Oral)  Resp 17  SpO2 93% Physical Exam  Nursing note and vitals reviewed. Constitutional: He is oriented to person, place, and time. He appears well-developed and well-nourished.  HENT:  Head: Normocephalic.  Eyes: Pupils are equal, round, and reactive to light.  Cardiovascular: Normal rate and regular rhythm.   Pulmonary/Chest: Effort normal and breath sounds normal.  Abdominal: Soft. He exhibits no distension.  Musculoskeletal: He exhibits edema.  Neurological: He is alert and oriented to person, place, and time.  Skin: Skin is warm and dry.  Psychiatric: He has a normal mood and affect. His behavior is normal. Judgment and thought content normal.    ED Course  Procedures (including critical care time) Labs Review Labs Reviewed  URINALYSIS, ROUTINE W REFLEX MICROSCOPIC   Imaging Review No results found. Patient had catheter placed early this morning.  Patient concerned about decreased drainage from catheter for several hours today, but drainage and flow have returned, with gross hematuria noted in drainage bag.  Foley patent currently.  With nitrites present in urine, will cover for UTI with keflex (urine c&s collected at this morning's visit, results pending).  No fever, no abdominal pain. Patient to follow-up with his PCP and urology. MDM  Hematuria.    Jimmye Norman, NP 04/30/13 2325

## 2013-05-01 ENCOUNTER — Encounter (HOSPITAL_COMMUNITY): Payer: Self-pay

## 2013-05-01 ENCOUNTER — Emergency Department (HOSPITAL_COMMUNITY)
Admission: EM | Admit: 2013-05-01 | Discharge: 2013-05-01 | Disposition: A | Discharge status: Home / Self Care | Payer: Medicare Other | Attending: Emergency Medicine | Admitting: Emergency Medicine

## 2013-05-01 DIAGNOSIS — I509 Heart failure, unspecified: Secondary | ICD-10-CM | POA: Insufficient documentation

## 2013-05-01 DIAGNOSIS — F3289 Other specified depressive episodes: Secondary | ICD-10-CM | POA: Insufficient documentation

## 2013-05-01 DIAGNOSIS — Z8619 Personal history of other infectious and parasitic diseases: Secondary | ICD-10-CM | POA: Insufficient documentation

## 2013-05-01 DIAGNOSIS — H409 Unspecified glaucoma: Secondary | ICD-10-CM | POA: Insufficient documentation

## 2013-05-01 DIAGNOSIS — Y846 Urinary catheterization as the cause of abnormal reaction of the patient, or of later complication, without mention of misadventure at the time of the procedure: Secondary | ICD-10-CM | POA: Insufficient documentation

## 2013-05-01 DIAGNOSIS — T839XXS Unspecified complication of genitourinary prosthetic device, implant and graft, sequela: Secondary | ICD-10-CM

## 2013-05-01 DIAGNOSIS — M109 Gout, unspecified: Secondary | ICD-10-CM | POA: Insufficient documentation

## 2013-05-01 DIAGNOSIS — Z8701 Personal history of pneumonia (recurrent): Secondary | ICD-10-CM | POA: Insufficient documentation

## 2013-05-01 DIAGNOSIS — R011 Cardiac murmur, unspecified: Secondary | ICD-10-CM | POA: Insufficient documentation

## 2013-05-01 DIAGNOSIS — E119 Type 2 diabetes mellitus without complications: Secondary | ICD-10-CM | POA: Insufficient documentation

## 2013-05-01 DIAGNOSIS — I252 Old myocardial infarction: Secondary | ICD-10-CM | POA: Insufficient documentation

## 2013-05-01 DIAGNOSIS — G40909 Epilepsy, unspecified, not intractable, without status epilepticus: Secondary | ICD-10-CM | POA: Insufficient documentation

## 2013-05-01 DIAGNOSIS — F329 Major depressive disorder, single episode, unspecified: Secondary | ICD-10-CM | POA: Insufficient documentation

## 2013-05-01 DIAGNOSIS — Z794 Long term (current) use of insulin: Secondary | ICD-10-CM | POA: Insufficient documentation

## 2013-05-01 DIAGNOSIS — Z79899 Other long term (current) drug therapy: Secondary | ICD-10-CM | POA: Insufficient documentation

## 2013-05-01 DIAGNOSIS — I129 Hypertensive chronic kidney disease with stage 1 through stage 4 chronic kidney disease, or unspecified chronic kidney disease: Secondary | ICD-10-CM | POA: Insufficient documentation

## 2013-05-01 DIAGNOSIS — Z8673 Personal history of transient ischemic attack (TIA), and cerebral infarction without residual deficits: Secondary | ICD-10-CM | POA: Insufficient documentation

## 2013-05-01 DIAGNOSIS — I2510 Atherosclerotic heart disease of native coronary artery without angina pectoris: Secondary | ICD-10-CM | POA: Insufficient documentation

## 2013-05-01 DIAGNOSIS — N189 Chronic kidney disease, unspecified: Secondary | ICD-10-CM | POA: Insufficient documentation

## 2013-05-01 DIAGNOSIS — Z872 Personal history of diseases of the skin and subcutaneous tissue: Secondary | ICD-10-CM | POA: Insufficient documentation

## 2013-05-01 DIAGNOSIS — E785 Hyperlipidemia, unspecified: Secondary | ICD-10-CM | POA: Insufficient documentation

## 2013-05-01 DIAGNOSIS — R300 Dysuria: Secondary | ICD-10-CM | POA: Insufficient documentation

## 2013-05-01 DIAGNOSIS — T83091A Other mechanical complication of indwelling urethral catheter, initial encounter: Secondary | ICD-10-CM | POA: Insufficient documentation

## 2013-05-01 DIAGNOSIS — Z951 Presence of aortocoronary bypass graft: Secondary | ICD-10-CM | POA: Insufficient documentation

## 2013-05-01 DIAGNOSIS — K219 Gastro-esophageal reflux disease without esophagitis: Secondary | ICD-10-CM | POA: Insufficient documentation

## 2013-05-01 DIAGNOSIS — G4730 Sleep apnea, unspecified: Secondary | ICD-10-CM | POA: Insufficient documentation

## 2013-05-01 DIAGNOSIS — R319 Hematuria, unspecified: Secondary | ICD-10-CM | POA: Insufficient documentation

## 2013-05-01 DIAGNOSIS — G8929 Other chronic pain: Secondary | ICD-10-CM | POA: Insufficient documentation

## 2013-05-01 DIAGNOSIS — Z792 Long term (current) use of antibiotics: Secondary | ICD-10-CM | POA: Insufficient documentation

## 2013-05-01 DIAGNOSIS — Z87891 Personal history of nicotine dependence: Secondary | ICD-10-CM | POA: Insufficient documentation

## 2013-05-01 DIAGNOSIS — Z8614 Personal history of Methicillin resistant Staphylococcus aureus infection: Secondary | ICD-10-CM | POA: Insufficient documentation

## 2013-05-01 DIAGNOSIS — Z8674 Personal history of sudden cardiac arrest: Secondary | ICD-10-CM | POA: Insufficient documentation

## 2013-05-01 DIAGNOSIS — I209 Angina pectoris, unspecified: Secondary | ICD-10-CM | POA: Insufficient documentation

## 2013-05-01 DIAGNOSIS — Z7902 Long term (current) use of antithrombotics/antiplatelets: Secondary | ICD-10-CM | POA: Insufficient documentation

## 2013-05-01 LAB — CBC
Hemoglobin: 12.9 g/dL — ABNORMAL LOW (ref 13.0–17.0)
RBC: 4.32 MIL/uL (ref 4.22–5.81)

## 2013-05-01 LAB — POCT I-STAT, CHEM 8
Calcium, Ion: 1.09 mmol/L — ABNORMAL LOW (ref 1.13–1.30)
Chloride: 106 mEq/L (ref 96–112)
HCT: 40 % (ref 39.0–52.0)
Potassium: 4.7 mEq/L (ref 3.5–5.1)

## 2013-05-01 LAB — PROTIME-INR
INR: 1.03 (ref 0.00–1.49)
Prothrombin Time: 13.3 seconds (ref 11.6–15.2)

## 2013-05-01 NOTE — ED Notes (Signed)
He states he was seen at [facility] yesterday with c/o urinary retention.  He had foley cath. Inserted there yesterday; and is here today with c/o persistent gross hematuria with urine leaking around catheter.  He is in no distress.  He is obese, with much dependent edema which he and his family state is chronic.  He was advised if he had any problem with foley, to come to [facility] to see urologist.

## 2013-05-01 NOTE — ED Provider Notes (Signed)
CSN: [id number]     Arrival date & time 05/01/13  1209 History   First MD Initiated Contact with Patient 05/01/13 1223     Chief Complaint  Patient presents with  . Hematuria   (Consider location/radiation/quality/duration/timing/severity/associated sxs/prior Treatment) HPI Comments: 72 year old male with an extensive past medical history presents to the emergency department complaining of leaking urinary catheter. Patient was seen at [facility] yesterday morning due to urinary retention, had a catheter placed, returns to [facility] later that day with continued hematuria. The catheter was not changed at that time, returns today with a catheter leaking, states there is blood all over his house from a leaking bag. Denies abdominal pain, nausea, vomiting, fever or chills. He has a followup with urology on Monday morning. Labs were checked yesterday which showed his BUN and creatinine within normal range, urinalysis showed an infection which he was prescribed Keflex for. He is on Plavix.  Patient is a 72 y.o. male presenting with hematuria. The history is provided by the patient and a relative.  Hematuria Pertinent negatives include no abdominal pain, chills, fever, nausea or vomiting.    Past Medical History  Diagnosis Date  . Hypertension   . CVA (cerebral infarction)   . Epilepsy   . Peripheral vascular disease   . Ulcer     left foot  . Cellulitis   . Chronic kidney disease   . Dyslipidemia   . Chronic venous insufficiency   . Sleep apnea   . Morbid obesity   . Diverticulosis   . Benign prostatic hypertrophy   . Glaucoma   . Staphylococcus aureus infection   . Gout   . Depression   . Insomnia   . Coronary artery disease   . CAD (coronary artery disease), native coronary artery   . GERD (gastroesophageal reflux disease)   . Heart murmur   . Myocardial infarction ~ 11/2011    "in ICU @ [facility]; stress-related"  . Angina   . Pneumonia 12/11/11  . Shortness of breath 12/11/11     "mostly w/exertion"  . Type II diabetes mellitus   . Chronic daily headache     "~ all the time"  . Seizures     hx; "don't know from what"  . Arthritis   . CHF (congestive heart failure) May 2013  . Stroke ~ 1983    "little weaker right hand"  . Recent heart attack 12/09/11    Stress heart attack  . MRSA (methicillin resistant staph aureus) culture positive Dec. 17, 2013     right heel infection   Past Surgical History  Procedure Laterality Date  . Appendectomy    . Lumbar laminectomy    . Mastoid debridement    . Knee and ankle surgery  1979    "caved in in a ditch; messed up legs"; right  . Cholecystectomy    . Back surgery      "5 or 6"  . Peripheral arterial stent graft      "don't remember which side"  . Coronary artery bypass graft  1983; 1992    "CABG X 5; CABG X 8"  . Lower extremity angiogram  01/21/2012    Procedure: LOWER EXTREMITY ANGIOGRAM;  Surgeon: Nada Libman, MD;  Location: [facility] OR;  Service: Vascular;  Laterality: Bilateral;  WITH POSSIBLE INTERVENTION  . Eye surgery      "laser; both eyes"  . Eye surgery  05/08/2012    Cataract Left eye  . Heel bx  Dec. 17, 2013    Right Heel Bx.     Family History  Problem Relation Age of Onset  . Hypertension Other   . Cancer Other   . Cancer Sister   . Deep vein thrombosis Brother   . Heart disease Brother   . Deep vein thrombosis Brother   . Heart disease Brother   . Cancer Sister    History  Substance Use Topics  . Smoking status: Former Smoker -- 1.00 packs/day for 20 years    Types: Cigarettes    Quit date: 07/29/1991  . Smokeless tobacco: Former Neurosurgeon    Types: Chew    Quit date: 08/06/1987  . Alcohol Use: No     Comment: occasionallly    Review of Systems  Constitutional: Negative for fever and chills.  Gastrointestinal: Negative for nausea, vomiting and abdominal pain.  Genitourinary: Positive for dysuria and hematuria.  Musculoskeletal: Negative for back pain.  All other systems  reviewed and are negative.    Allergies  Contrast media  Home Medications   Current Outpatient Rx  Name  Route  Sig  Dispense  Refill  . allopurinol (ZYLOPRIM) 300 MG tablet   Oral   Take 300 mg by mouth every morning.          Marland Kitchen ALPRAZolam (XANAX) 1 MG tablet   Oral   Take 0.5-2 mg by mouth 3 (three) times daily. Takes half tablet in the morning and at 430 pm and 2 tablets at bedtime.         Marland Kitchen atenolol (TENORMIN) 50 MG tablet   Oral   Take 50 mg by mouth 2 (two) times daily.          . brimonidine-timolol (COMBIGAN) 0.2-0.5 % ophthalmic solution   Both Eyes   Place 1 drop into both eyes at bedtime.          . clopidogrel (PLAVIX) 75 MG tablet   Oral   Take 75 mg by mouth every morning.          . diclofenac sodium (VOLTAREN) 1 % GEL   Topical   Apply 1 application topically 4 (four) times daily as needed. Applies to both knees as needed for pain         . furosemide (LASIX) 80 MG tablet   Oral   Take 160 mg by mouth every morning.          Marland Kitchen glipiZIDE (GLUCOTROL) 5 MG tablet   Oral   Take 5 mg by mouth 2 (two) times daily before a meal.           . HYDROcodone-acetaminophen (VICODIN ES) 7.5-750 MG per tablet   Oral   Take 1 tablet by mouth every 6 (six) hours as needed. For pain         . insulin glargine (LANTUS) 100 UNIT/ML injection   Subcutaneous   Inject 5-10 Units into the skin 2 (two) times daily as needed. Per sliding scale in divided doses. May use up to 140 units/day. Usual dose is 5-10 units         . isosorbide mononitrate (IMDUR) 30 MG 24 hr tablet   Oral   Take 30 mg by mouth every morning.          . latanoprost (XALATAN) 0.005 % ophthalmic solution   Both Eyes   Place 1 drop into both eyes at bedtime.           . nitroGLYCERIN (NITROSTAT) 0.4 MG SL tablet  Sublingual   Place 0.4 mg under the tongue every 5 (five) minutes as needed. For chest pain         . omeprazole (PRILOSEC) 20 MG capsule   Oral   Take  20 mg by mouth 2 (two) times daily.          . potassium chloride SA (K-DUR,KLOR-CON) 20 MEQ tablet   Oral   Take 60 mEq by mouth 3 (three) times daily.          . simvastatin (ZOCOR) 20 MG tablet   Oral   Take 20 mg by mouth every evening.          . temazepam (RESTORIL) 30 MG capsule   Oral   Take 30 mg by mouth at bedtime.          . cephALEXin (KEFLEX) 500 MG capsule   Oral   Take 1 capsule (500 mg total) by mouth 4 (four) times daily.   28 capsule   0    BP 153/42  Pulse 72  Temp(Src) 97.8 F (36.6 C) (Oral)  Resp 17  SpO2 97% Physical Exam  Nursing note and vitals reviewed. Constitutional: He is oriented to person, place, and time. He appears well-developed and well-nourished. No distress.  Obese  HENT:  Head: Normocephalic and atraumatic.  Mouth/Throat: Oropharynx is clear and moist.  Eyes: Conjunctivae are normal.  Neck: Normal range of motion. Neck supple.  Cardiovascular: Normal rate, regular rhythm and normal heart sounds.   +3 pitting edema LE bilateral, chronic per pt  Pulmonary/Chest: Effort normal and breath sounds normal.  Abdominal: Soft. Bowel sounds are normal. There is no tenderness.  Genitourinary:  Foley catheter in place, clots coming out around insertion of catheter. Foley bag leaking, full of bright red blood.  Musculoskeletal: Normal range of motion. He exhibits no edema.  Neurological: He is alert and oriented to person, place, and time.  Skin: Skin is warm and dry. He is not diaphoretic.  Psychiatric: He has a normal mood and affect. His behavior is normal.    ED Course  Procedures (including critical care time) Labs Review Labs Reviewed  CBC - Abnormal; Notable for the following:    Hemoglobin 12.9 (*)    All other components within normal limits  POCT I-STAT, CHEM 8 - Abnormal; Notable for the following:    Creatinine, Ser 1.40 (*)    Glucose, Bld 129 (*)    Calcium, Ion 1.09 (*)    All other components within normal  limits  PROTIME-INR   Imaging Review No results found.  MDM   1. Hematuria   2. Foley catheter problem, sequela    Patient with continued hematuria, leaking catheter bag, clots around foley. No abdominal pain. Catheter changed by Dr. Denton Lank, 27F, bleeding continued. Rechecked basic labs, hgb with only slight drop from yesterday. He is in NAD. I spoke with Dr. Marlou Porch with [facility] who initially said to increase size of balloon of catheter and apply traction which did not work. He then came to the ED to consult patient, states the bleeding is stopping and he is ok to go home at this time. Has f/u scheduled Monday at urology. Return precautions discussed. Patient states understanding of treatment care plan and is agreeable. Case discussed with my attending Dr. Denton Lank who also evaluated patient and agrees with plan of care.    Trevor Mace, PA-C 05/01/13 [phone]

## 2013-05-02 ENCOUNTER — Emergency Department (HOSPITAL_COMMUNITY)
Admission: EM | Admit: 2013-05-02 | Discharge: 2013-05-03 | Disposition: A | Discharge status: Home / Self Care | Payer: Medicare Other | Attending: Emergency Medicine | Admitting: Emergency Medicine

## 2013-05-02 ENCOUNTER — Encounter (HOSPITAL_COMMUNITY): Payer: Self-pay | Admitting: *Deleted

## 2013-05-02 DIAGNOSIS — Z951 Presence of aortocoronary bypass graft: Secondary | ICD-10-CM | POA: Insufficient documentation

## 2013-05-02 DIAGNOSIS — I509 Heart failure, unspecified: Secondary | ICD-10-CM | POA: Insufficient documentation

## 2013-05-02 DIAGNOSIS — K219 Gastro-esophageal reflux disease without esophagitis: Secondary | ICD-10-CM | POA: Insufficient documentation

## 2013-05-02 DIAGNOSIS — Z8701 Personal history of pneumonia (recurrent): Secondary | ICD-10-CM | POA: Insufficient documentation

## 2013-05-02 DIAGNOSIS — Z8673 Personal history of transient ischemic attack (TIA), and cerebral infarction without residual deficits: Secondary | ICD-10-CM | POA: Insufficient documentation

## 2013-05-02 DIAGNOSIS — Z87891 Personal history of nicotine dependence: Secondary | ICD-10-CM | POA: Insufficient documentation

## 2013-05-02 DIAGNOSIS — R339 Retention of urine, unspecified: Secondary | ICD-10-CM | POA: Insufficient documentation

## 2013-05-02 DIAGNOSIS — I129 Hypertensive chronic kidney disease with stage 1 through stage 4 chronic kidney disease, or unspecified chronic kidney disease: Secondary | ICD-10-CM | POA: Insufficient documentation

## 2013-05-02 DIAGNOSIS — G4700 Insomnia, unspecified: Secondary | ICD-10-CM | POA: Insufficient documentation

## 2013-05-02 DIAGNOSIS — Z79899 Other long term (current) drug therapy: Secondary | ICD-10-CM | POA: Insufficient documentation

## 2013-05-02 DIAGNOSIS — Z8614 Personal history of Methicillin resistant Staphylococcus aureus infection: Secondary | ICD-10-CM | POA: Insufficient documentation

## 2013-05-02 DIAGNOSIS — Z872 Personal history of diseases of the skin and subcutaneous tissue: Secondary | ICD-10-CM | POA: Insufficient documentation

## 2013-05-02 DIAGNOSIS — I209 Angina pectoris, unspecified: Secondary | ICD-10-CM | POA: Insufficient documentation

## 2013-05-02 DIAGNOSIS — Z794 Long term (current) use of insulin: Secondary | ICD-10-CM | POA: Insufficient documentation

## 2013-05-02 DIAGNOSIS — Z792 Long term (current) use of antibiotics: Secondary | ICD-10-CM | POA: Insufficient documentation

## 2013-05-02 DIAGNOSIS — M109 Gout, unspecified: Secondary | ICD-10-CM | POA: Insufficient documentation

## 2013-05-02 DIAGNOSIS — F3289 Other specified depressive episodes: Secondary | ICD-10-CM | POA: Insufficient documentation

## 2013-05-02 DIAGNOSIS — Z8679 Personal history of other diseases of the circulatory system: Secondary | ICD-10-CM | POA: Insufficient documentation

## 2013-05-02 DIAGNOSIS — Z7902 Long term (current) use of antithrombotics/antiplatelets: Secondary | ICD-10-CM | POA: Insufficient documentation

## 2013-05-02 DIAGNOSIS — I2510 Atherosclerotic heart disease of native coronary artery without angina pectoris: Secondary | ICD-10-CM | POA: Insufficient documentation

## 2013-05-02 DIAGNOSIS — N189 Chronic kidney disease, unspecified: Secondary | ICD-10-CM | POA: Insufficient documentation

## 2013-05-02 DIAGNOSIS — F329 Major depressive disorder, single episode, unspecified: Secondary | ICD-10-CM | POA: Insufficient documentation

## 2013-05-02 DIAGNOSIS — Z87448 Personal history of other diseases of urinary system: Secondary | ICD-10-CM | POA: Insufficient documentation

## 2013-05-02 DIAGNOSIS — Z8669 Personal history of other diseases of the nervous system and sense organs: Secondary | ICD-10-CM | POA: Insufficient documentation

## 2013-05-02 DIAGNOSIS — E119 Type 2 diabetes mellitus without complications: Secondary | ICD-10-CM | POA: Insufficient documentation

## 2013-05-02 DIAGNOSIS — H409 Unspecified glaucoma: Secondary | ICD-10-CM | POA: Insufficient documentation

## 2013-05-02 DIAGNOSIS — Z9861 Coronary angioplasty status: Secondary | ICD-10-CM | POA: Insufficient documentation

## 2013-05-02 DIAGNOSIS — I252 Old myocardial infarction: Secondary | ICD-10-CM | POA: Insufficient documentation

## 2013-05-02 NOTE — ED Notes (Signed)
Pt returned due to cath not draining, has not had any output since about 5pm. Here for same yesterday, large amt of clots flushed out at that time.

## 2013-05-03 LAB — CBC WITH DIFFERENTIAL/PLATELET
Basophils Absolute: 0 10*3/uL (ref 0.0–0.1)
HCT: 39.4 % (ref 39.0–52.0)
Hemoglobin: 12.4 g/dL — ABNORMAL LOW (ref 13.0–17.0)
Lymphocytes Relative: 28 % (ref 12–46)
Monocytes Absolute: 0.9 10*3/uL (ref 0.1–1.0)
Neutro Abs: 5.8 10*3/uL (ref 1.7–7.7)
Neutrophils Relative %: 60 % (ref 43–77)
RDW: 15.1 % (ref 11.5–15.5)
WBC: 9.8 10*3/uL (ref 4.0–10.5)

## 2013-05-03 LAB — BASIC METABOLIC PANEL
GFR calc Af Amer: 60 mL/min — ABNORMAL LOW (ref >90)
GFR calc non Af Amer: 52 mL/min — ABNORMAL LOW (ref >90)
Glucose, Bld: 115 mg/dL — ABNORMAL HIGH (ref 70–99)
Potassium: 4.7 mEq/L (ref 3.5–5.1)
Sodium: 140 mEq/L (ref 135–145)

## 2013-05-03 LAB — SAMPLE TO BLOOD BANK

## 2013-05-03 NOTE — ED Notes (Signed)
Patient is alert and oriented x3.  He is complaining of abdominal pain that is from urinary retention.  He has been seen in the ED for the last 3 days for the same issue.

## 2013-05-03 NOTE — ED Notes (Signed)
Foley irrigated with a total of 80mL of Sterile water.  50mL return of pink tinged urine.

## 2013-05-03 NOTE — ED Provider Notes (Signed)
CSN: [id number]     Arrival date & time 05/02/13  2348 History   First MD Initiated Contact with Patient 05/02/13 2358     Chief Complaint  Patient presents with  . Urinary Retention    foley cath clogged   (Consider location/radiation/quality/duration/timing/severity/associated sxs/prior Treatment) HPI 72 year old male presents to emergency department from home with complaint of clogged Foley catheter.  Patient has been seen multiple times in the past week.  For difficulties with hematuria and Foley catheter drainage.  Issues.  He is followup in the morning with urology.  He reports this afternoon.  He had difficulties with urination.  He reports he passed to blood clots, and then was able to pass urine for about another 2 hours.  Since 5 PM, however, he has not had any output.  He reports distention and fullness in his lower abdomen. Past Medical History  Diagnosis Date  . Hypertension   . CVA (cerebral infarction)   . Epilepsy   . Peripheral vascular disease   . Ulcer     left foot  . Cellulitis   . Chronic kidney disease   . Dyslipidemia   . Chronic venous insufficiency   . Sleep apnea   . Morbid obesity   . Diverticulosis   . Benign prostatic hypertrophy   . Glaucoma   . Staphylococcus aureus infection   . Gout   . Depression   . Insomnia   . Coronary artery disease   . CAD (coronary artery disease), native coronary artery   . GERD (gastroesophageal reflux disease)   . Heart murmur   . Myocardial infarction ~ 11/2011    "in ICU @ [facility]; stress-related"  . Angina   . Pneumonia 12/11/11  . Shortness of breath 12/11/11    "mostly w/exertion"  . Type II diabetes mellitus   . Chronic daily headache     "~ all the time"  . Seizures     hx; "don't know from what"  . Arthritis   . CHF (congestive heart failure) May 2013  . Stroke ~ 1983    "little weaker right hand"  . Recent heart attack 12/09/11    Stress heart attack  . MRSA (methicillin resistant staph aureus)  culture positive Dec. 17, 2013     right heel infection   Past Surgical History  Procedure Laterality Date  . Appendectomy    . Lumbar laminectomy    . Mastoid debridement    . Knee and ankle surgery  1979    "caved in in a ditch; messed up legs"; right  . Cholecystectomy    . Back surgery      "5 or 6"  . Peripheral arterial stent graft      "don't remember which side"  . Coronary artery bypass graft  1983; 1992    "CABG X 5; CABG X 8"  . Lower extremity angiogram  01/21/2012    Procedure: LOWER EXTREMITY ANGIOGRAM;  Surgeon: Nada Libman, MD;  Location: [facility] OR;  Service: Vascular;  Laterality: Bilateral;  WITH POSSIBLE INTERVENTION  . Eye surgery      "laser; both eyes"  . Eye surgery  05/08/2012    Cataract Left eye  . Heel bx  Dec. 17, 2013    Right Heel Bx.     Family History  Problem Relation Age of Onset  . Hypertension Other   . Cancer Other   . Cancer Sister   . Deep vein thrombosis Brother   . Heart  disease Brother   . Deep vein thrombosis Brother   . Heart disease Brother   . Cancer Sister    History  Substance Use Topics  . Smoking status: Former Smoker -- 1.00 packs/day for 20 years    Types: Cigarettes    Quit date: 07/29/1991  . Smokeless tobacco: Former Neurosurgeon    Types: Chew    Quit date: 08/06/1987  . Alcohol Use: No     Comment: occasionallly    Review of Systems  All other systems reviewed and are negative.    Allergies  Contrast media  Home Medications   Current Outpatient Rx  Name  Route  Sig  Dispense  Refill  . allopurinol (ZYLOPRIM) 300 MG tablet   Oral   Take 300 mg by mouth every morning.          Marland Kitchen ALPRAZolam (XANAX) 1 MG tablet   Oral   Take 0.5-2 mg by mouth 3 (three) times daily. Takes half tablet in the morning and at 430 pm and 2 tablets at bedtime.         Marland Kitchen atenolol (TENORMIN) 50 MG tablet   Oral   Take 50 mg by mouth 2 (two) times daily.          . brimonidine-timolol (COMBIGAN) 0.2-0.5 % ophthalmic  solution   Both Eyes   Place 1 drop into both eyes at bedtime.          . clopidogrel (PLAVIX) 75 MG tablet   Oral   Take 75 mg by mouth every morning.          . diclofenac sodium (VOLTAREN) 1 % GEL   Topical   Apply 1 application topically 4 (four) times daily as needed. Applies to both knees as needed for pain         . furosemide (LASIX) 80 MG tablet   Oral   Take 160 mg by mouth every morning.          Marland Kitchen glipiZIDE (GLUCOTROL) 5 MG tablet   Oral   Take 5 mg by mouth 2 (two) times daily before a meal.           . HYDROcodone-acetaminophen (VICODIN ES) 7.5-750 MG per tablet   Oral   Take 1 tablet by mouth every 6 (six) hours as needed. For pain         . insulin glargine (LANTUS) 100 UNIT/ML injection   Subcutaneous   Inject 5-10 Units into the skin 2 (two) times daily as needed. Per sliding scale in divided doses. May use up to 140 units/day. Usual dose is 5-10 units         . isosorbide mononitrate (IMDUR) 30 MG 24 hr tablet   Oral   Take 30 mg by mouth every morning.          . latanoprost (XALATAN) 0.005 % ophthalmic solution   Both Eyes   Place 1 drop into both eyes at bedtime.           Marland Kitchen omeprazole (PRILOSEC) 20 MG capsule   Oral   Take 20 mg by mouth 2 (two) times daily.          . potassium chloride SA (K-DUR,KLOR-CON) 20 MEQ tablet   Oral   Take 60 mEq by mouth 3 (three) times daily.          . simvastatin (ZOCOR) 20 MG tablet   Oral   Take 20 mg by mouth every evening.          Marland Kitchen  temazepam (RESTORIL) 30 MG capsule   Oral   Take 30 mg by mouth at bedtime.          . cephALEXin (KEFLEX) 500 MG capsule   Oral   Take 1 capsule (500 mg total) by mouth 4 (four) times daily.   28 capsule   0   . nitroGLYCERIN (NITROSTAT) 0.4 MG SL tablet   Sublingual   Place 0.4 mg under the tongue every 5 (five) minutes as needed. For chest pain          BP 148/55  Pulse 69  Temp(Src) 98.1 F (36.7 C) (Oral)  Resp 22  Ht 5\' 10"   (1.778 m)  Wt 275 lb (124.739 kg)  BMI 39.46 kg/m2  SpO2 97% Physical Exam  Constitutional:  Chronically ill-appearing, obese, male, uncomfortable  HENT:  Head: Normocephalic and atraumatic.  Nose: Nose normal.  Mouth/Throat: Oropharynx is clear and moist.  Neck: Normal range of motion. Neck supple. No JVD present. No tracheal deviation present. No thyromegaly present.  Cardiovascular: Normal rate, regular rhythm, normal heart sounds and intact distal pulses.   Pulmonary/Chest: Effort normal and breath sounds normal. No stridor. No respiratory distress. He has no wheezes. He has no rales. He exhibits no tenderness.  Abdominal: Soft. Bowel sounds are normal. There is tenderness (suprapubic tenderness).  Musculoskeletal: He exhibits edema.  Lymphadenopathy:    He has no cervical adenopathy.  Skin: Skin is warm. No rash noted. No erythema. No pallor.    ED Course  Procedures (including critical care time) Labs Review Labs Reviewed  CBC WITH DIFFERENTIAL - Abnormal; Notable for the following:    RBC 4.19 (*)    Hemoglobin 12.4 (*)    All other components within normal limits  BASIC METABOLIC PANEL - Abnormal; Notable for the following:    Glucose, Bld 115 (*)    GFR calc non Af Amer 52 (*)    GFR calc Af Amer 60 (*)    All other components within normal limits  SAMPLE TO BLOOD BANK   Imaging Review No results found.  MDM   1. Urinary retention    72 year old male with urinary retention.  Nursing staff.  Flush catheter, and initially, was unable to get good flow.  Bedside ultrasound performed by me, no urine, noted in bladder, only, urinary catheter balloon.  Patient hooked back up to Foley catheter tubing, and is noted to have free drainage of clear urine.  He is feeling better.  Suspect an adherent clot to the catheter that has since past.  Patient has followup with urology within the next 12 hours, understands to return to the emergency department, if he has another  issue.    Olivia Mackie, MD 05/03/13 [phone]

## 2013-05-03 NOTE — ED Notes (Signed)
Patient is alert and oriented x3.  He was given DC instructions and follow up visit instructions.  Patient gave verbal understanding.  He was DC ambulatory under his own power to home.  V/S stable.  He was not showing any signs of distress on DC 

## 2013-05-04 ENCOUNTER — Encounter (HOSPITAL_COMMUNITY): Payer: Self-pay

## 2013-05-04 ENCOUNTER — Emergency Department (HOSPITAL_COMMUNITY)
Admission: EM | Admit: 2013-05-04 | Discharge: 2013-05-04 | Disposition: A | Discharge status: Home / Self Care | Payer: Medicare Other | Attending: Emergency Medicine | Admitting: Emergency Medicine

## 2013-05-04 DIAGNOSIS — N189 Chronic kidney disease, unspecified: Secondary | ICD-10-CM | POA: Insufficient documentation

## 2013-05-04 DIAGNOSIS — M109 Gout, unspecified: Secondary | ICD-10-CM | POA: Insufficient documentation

## 2013-05-04 DIAGNOSIS — I252 Old myocardial infarction: Secondary | ICD-10-CM | POA: Insufficient documentation

## 2013-05-04 DIAGNOSIS — Z79899 Other long term (current) drug therapy: Secondary | ICD-10-CM | POA: Insufficient documentation

## 2013-05-04 DIAGNOSIS — I2510 Atherosclerotic heart disease of native coronary artery without angina pectoris: Secondary | ICD-10-CM | POA: Insufficient documentation

## 2013-05-04 DIAGNOSIS — E785 Hyperlipidemia, unspecified: Secondary | ICD-10-CM | POA: Insufficient documentation

## 2013-05-04 DIAGNOSIS — Z872 Personal history of diseases of the skin and subcutaneous tissue: Secondary | ICD-10-CM | POA: Insufficient documentation

## 2013-05-04 DIAGNOSIS — Z8669 Personal history of other diseases of the nervous system and sense organs: Secondary | ICD-10-CM | POA: Insufficient documentation

## 2013-05-04 DIAGNOSIS — Z8674 Personal history of sudden cardiac arrest: Secondary | ICD-10-CM | POA: Insufficient documentation

## 2013-05-04 DIAGNOSIS — I129 Hypertensive chronic kidney disease with stage 1 through stage 4 chronic kidney disease, or unspecified chronic kidney disease: Secondary | ICD-10-CM | POA: Insufficient documentation

## 2013-05-04 DIAGNOSIS — I509 Heart failure, unspecified: Secondary | ICD-10-CM | POA: Insufficient documentation

## 2013-05-04 DIAGNOSIS — Z9861 Coronary angioplasty status: Secondary | ICD-10-CM | POA: Insufficient documentation

## 2013-05-04 DIAGNOSIS — E119 Type 2 diabetes mellitus without complications: Secondary | ICD-10-CM | POA: Insufficient documentation

## 2013-05-04 DIAGNOSIS — Z8701 Personal history of pneumonia (recurrent): Secondary | ICD-10-CM | POA: Insufficient documentation

## 2013-05-04 DIAGNOSIS — T83091A Other mechanical complication of indwelling urethral catheter, initial encounter: Secondary | ICD-10-CM | POA: Insufficient documentation

## 2013-05-04 DIAGNOSIS — K219 Gastro-esophageal reflux disease without esophagitis: Secondary | ICD-10-CM | POA: Insufficient documentation

## 2013-05-04 DIAGNOSIS — Z87891 Personal history of nicotine dependence: Secondary | ICD-10-CM | POA: Insufficient documentation

## 2013-05-04 DIAGNOSIS — R319 Hematuria, unspecified: Secondary | ICD-10-CM | POA: Insufficient documentation

## 2013-05-04 DIAGNOSIS — Z794 Long term (current) use of insulin: Secondary | ICD-10-CM | POA: Insufficient documentation

## 2013-05-04 DIAGNOSIS — F3289 Other specified depressive episodes: Secondary | ICD-10-CM | POA: Insufficient documentation

## 2013-05-04 DIAGNOSIS — R011 Cardiac murmur, unspecified: Secondary | ICD-10-CM | POA: Insufficient documentation

## 2013-05-04 DIAGNOSIS — G4700 Insomnia, unspecified: Secondary | ICD-10-CM | POA: Insufficient documentation

## 2013-05-04 DIAGNOSIS — H409 Unspecified glaucoma: Secondary | ICD-10-CM | POA: Insufficient documentation

## 2013-05-04 DIAGNOSIS — Y846 Urinary catheterization as the cause of abnormal reaction of the patient, or of later complication, without mention of misadventure at the time of the procedure: Secondary | ICD-10-CM | POA: Insufficient documentation

## 2013-05-04 DIAGNOSIS — F329 Major depressive disorder, single episode, unspecified: Secondary | ICD-10-CM | POA: Insufficient documentation

## 2013-05-04 DIAGNOSIS — Z8673 Personal history of transient ischemic attack (TIA), and cerebral infarction without residual deficits: Secondary | ICD-10-CM | POA: Insufficient documentation

## 2013-05-04 DIAGNOSIS — Z951 Presence of aortocoronary bypass graft: Secondary | ICD-10-CM | POA: Insufficient documentation

## 2013-05-04 DIAGNOSIS — T839XXA Unspecified complication of genitourinary prosthetic device, implant and graft, initial encounter: Secondary | ICD-10-CM

## 2013-05-04 LAB — POCT I-STAT, CHEM 8
BUN: 14 mg/dL (ref 6–23)
Creatinine, Ser: 1.5 mg/dL — ABNORMAL HIGH (ref 0.50–1.35)
Glucose, Bld: 126 mg/dL — ABNORMAL HIGH (ref 70–99)
Hemoglobin: 13.6 g/dL (ref 13.0–17.0)
Potassium: 4.9 mEq/L (ref 3.5–5.1)
Sodium: 142 mEq/L (ref 135–145)

## 2013-05-04 NOTE — ED Provider Notes (Signed)
Medical screening examination/treatment/procedure(s) were conducted as a shared visit with non-physician practitioner(s) and myself.  I personally evaluated the patient during the encounter Pt c/o recent dx urinary retention, has foley. Now clots, and leaking around catheter. abd soft nt. No fever. In ed, old foley d/cd, I placed new 22 fr catheter without difficulty, to hub, grossly bloody urine flowing, nurse to irrigate clots from bladder.  Initially improve, but cath reclots, urology consulted.   Suzi Roots, MD 05/04/13 [phone]

## 2013-05-04 NOTE — ED Provider Notes (Signed)
CSN: [id number]     Arrival date & time 05/04/13  4098 History  First MD Initiated Contact with Patient 05/04/13 [phone]     Chief Complaint  Patient presents with  . foley catheter clogged     HPI Pt has history of urinary obstruction.  He had a catheter placed just this past Thursday.  He has had issues with the catheter not draining properly. He has noticed some clots of blood in the tubing.  This morning the family felt that the urine output had decreased and the catheter was not draining properly.  Family tried to flush it but did not see as much drain out as they flusehd. Pt thinks this problem all started after changing to a different brand underwear a couple of years ago that was too tight.  No fevers.  No vomiting.   They are scheduled to see the urologist this week.   Past Medical History  Diagnosis Date  . Hypertension   . CVA (cerebral infarction)   . Epilepsy   . Peripheral vascular disease   . Ulcer     left foot  . Cellulitis   . Chronic kidney disease   . Dyslipidemia   . Chronic venous insufficiency   . Sleep apnea   . Morbid obesity   . Diverticulosis   . Benign prostatic hypertrophy   . Glaucoma   . Staphylococcus aureus infection   . Gout   . Depression   . Insomnia   . Coronary artery disease   . CAD (coronary artery disease), native coronary artery   . GERD (gastroesophageal reflux disease)   . Heart murmur   . Myocardial infarction ~ 11/2011    "in ICU @ [facility]; stress-related"  . Angina   . Pneumonia 12/11/11  . Shortness of breath 12/11/11    "mostly w/exertion"  . Type II diabetes mellitus   . Chronic daily headache     "~ all the time"  . Seizures     hx; "don't know from what"  . Arthritis   . CHF (congestive heart failure) May 2013  . Stroke ~ 1983    "little weaker right hand"  . Recent heart attack 12/09/11    Stress heart attack  . MRSA (methicillin resistant staph aureus) culture positive Dec. 17, 2013     right heel infection   Past  Surgical History  Procedure Laterality Date  . Appendectomy    . Lumbar laminectomy    . Mastoid debridement    . Knee and ankle surgery  1979    "caved in in a ditch; messed up legs"; right  . Cholecystectomy    . Back surgery      "5 or 6"  . Peripheral arterial stent graft      "don't remember which side"  . Coronary artery bypass graft  1983; 1992    "CABG X 5; CABG X 8"  . Lower extremity angiogram  01/21/2012    Procedure: LOWER EXTREMITY ANGIOGRAM;  Surgeon: Nada Libman, MD;  Location: [facility] OR;  Service: Vascular;  Laterality: Bilateral;  WITH POSSIBLE INTERVENTION  . Eye surgery      "laser; both eyes"  . Eye surgery  05/08/2012    Cataract Left eye  . Heel bx  Dec. 17, 2013    Right Heel Bx.     Family History  Problem Relation Age of Onset  . Hypertension Other   . Cancer Other   . Cancer Sister   .  Deep vein thrombosis Brother   . Heart disease Brother   . Deep vein thrombosis Brother   . Heart disease Brother   . Cancer Sister    History  Substance Use Topics  . Smoking status: Former Smoker -- 1.00 packs/day for 20 years    Types: Cigarettes    Quit date: 07/29/1991  . Smokeless tobacco: Former Neurosurgeon    Types: Chew    Quit date: 08/06/1987  . Alcohol Use: No     Comment: occasionallly    Review of Systems  Constitutional: Negative for fever.  Respiratory: Negative for cough.   Cardiovascular: Negative for chest pain.  Gastrointestinal: Negative for abdominal pain.  Genitourinary: Positive for hematuria.  Musculoskeletal: Negative for joint swelling.  Skin: Negative for rash.  Neurological: Negative for light-headedness.  All other systems reviewed and are negative.    Allergies  Contrast media  Home Medications   Current Outpatient Rx  Name  Route  Sig  Dispense  Refill  . allopurinol (ZYLOPRIM) 300 MG tablet   Oral   Take 300 mg by mouth every morning.          Marland Kitchen ALPRAZolam (XANAX) 1 MG tablet   Oral   Take 0.5-2 mg by mouth 3  (three) times daily. Takes half tablet in the morning and at 430 pm and 2 tablets at bedtime.         Marland Kitchen atenolol (TENORMIN) 50 MG tablet   Oral   Take 50 mg by mouth 2 (two) times daily.          . brimonidine-timolol (COMBIGAN) 0.2-0.5 % ophthalmic solution   Both Eyes   Place 1 drop into both eyes at bedtime.          . clopidogrel (PLAVIX) 75 MG tablet   Oral   Take 75 mg by mouth every morning.          . furosemide (LASIX) 80 MG tablet   Oral   Take 160 mg by mouth every morning.          Marland Kitchen glipiZIDE (GLUCOTROL) 5 MG tablet   Oral   Take 5 mg by mouth 2 (two) times daily before a meal.           . HYDROcodone-acetaminophen (VICODIN ES) 7.5-750 MG per tablet   Oral   Take 1 tablet by mouth every 6 (six) hours as needed. For pain         . insulin glargine (LANTUS) 100 UNIT/ML injection   Subcutaneous   Inject 5-10 Units into the skin 2 (two) times daily as needed. Per sliding scale in divided doses. May use up to 140 units/day. Usual dose is 5-10 units         . isosorbide mononitrate (IMDUR) 30 MG 24 hr tablet   Oral   Take 30 mg by mouth every morning.          . latanoprost (XALATAN) 0.005 % ophthalmic solution   Both Eyes   Place 1 drop into both eyes at bedtime.           . nitroGLYCERIN (NITROSTAT) 0.4 MG SL tablet   Sublingual   Place 0.4 mg under the tongue every 5 (five) minutes as needed. For chest pain         . omeprazole (PRILOSEC) 20 MG capsule   Oral   Take 20 mg by mouth 2 (two) times daily.          . potassium chloride SA (  K-DUR,KLOR-CON) 20 MEQ tablet   Oral   Take 60 mEq by mouth 3 (three) times daily.          . simvastatin (ZOCOR) 20 MG tablet   Oral   Take 20 mg by mouth every evening.          . temazepam (RESTORIL) 30 MG capsule   Oral   Take 30 mg by mouth at bedtime.          . cephALEXin (KEFLEX) 500 MG capsule   Oral   Take 1 capsule (500 mg total) by mouth 4 (four) times daily.   28  capsule   0   . diclofenac sodium (VOLTAREN) 1 % GEL   Topical   Apply 1 application topically 4 (four) times daily as needed. Applies to both knees as needed for pain          BP 124/55  Pulse 62  Temp(Src) 97.8 F (36.6 C) (Oral)  Resp 16  SpO2 95% Physical Exam  Nursing note and vitals reviewed. Constitutional: No distress.  Obese   HENT:  Head: Normocephalic and atraumatic.  Right Ear: External ear normal.  Left Ear: External ear normal.  Eyes: Conjunctivae are normal. Right eye exhibits no discharge. Left eye exhibits no discharge. No scleral icterus.  Neck: Neck supple. No tracheal deviation present.  Cardiovascular: Normal rate, regular rhythm and intact distal pulses.   Pulmonary/Chest: Effort normal and breath sounds normal. No stridor. No respiratory distress. He has no wheezes. He has no rales.  Abdominal: Soft. Bowel sounds are normal. He exhibits no distension. There is no tenderness. There is no rebound and no guarding.  Genitourinary:  22 french foley catheter in place, yellow cloudy urine in the foley bag  Musculoskeletal: He exhibits edema (pitting edema bilateral feet, calfs). He exhibits no tenderness.  Neurological: He is alert. He has normal strength. No sensory deficit. Cranial nerve deficit:  no gross defecits noted. He exhibits normal muscle tone. He displays no seizure activity. Coordination normal.  Skin: Skin is warm and dry. No rash noted.  Psychiatric: He has a normal mood and affect.    ED Course  Procedures (including critical care time) Labs Review Labs Reviewed  POCT I-STAT, CHEM 8 - Abnormal; Notable for the following:    Creatinine, Ser 1.50 (*)    Glucose, Bld 126 (*)    All other components within normal limits   Imaging Review No results found.  Nursing staff performed a bladder scan. The bladder was empty. The patient's catheter continues to drain yellow urine.  MDM   1. Foley catheter problem, initial encounter      CR  slightly increased at 1.5 but no bladder outlet obstruction.  Catheter is functioning properly. Patient is safe for discharge to followup with urologist   Celene Kras, MD 05/04/13 [phone]

## 2013-05-04 NOTE — ED Notes (Signed)
Patient reports that his catheter has no tbeen draining properly. patient's wife states she has been flushing the catheter as she was taught and has not had the the same amount returned. Patient states that he has seen blood clots in the tubing. There is yellow urine currently in the tubing with a very small clot present.

## 2013-05-07 ENCOUNTER — Emergency Department (HOSPITAL_COMMUNITY)
Admission: EM | Admit: 2013-05-07 | Discharge: 2013-05-07 | Disposition: A | Discharge status: Home / Self Care | Payer: Medicare Other | Attending: Emergency Medicine | Admitting: Emergency Medicine

## 2013-05-07 DIAGNOSIS — I2510 Atherosclerotic heart disease of native coronary artery without angina pectoris: Secondary | ICD-10-CM | POA: Insufficient documentation

## 2013-05-07 DIAGNOSIS — Z794 Long term (current) use of insulin: Secondary | ICD-10-CM | POA: Insufficient documentation

## 2013-05-07 DIAGNOSIS — I509 Heart failure, unspecified: Secondary | ICD-10-CM | POA: Insufficient documentation

## 2013-05-07 DIAGNOSIS — K219 Gastro-esophageal reflux disease without esophagitis: Secondary | ICD-10-CM | POA: Insufficient documentation

## 2013-05-07 DIAGNOSIS — F329 Major depressive disorder, single episode, unspecified: Secondary | ICD-10-CM | POA: Insufficient documentation

## 2013-05-07 DIAGNOSIS — I129 Hypertensive chronic kidney disease with stage 1 through stage 4 chronic kidney disease, or unspecified chronic kidney disease: Secondary | ICD-10-CM | POA: Insufficient documentation

## 2013-05-07 DIAGNOSIS — E785 Hyperlipidemia, unspecified: Secondary | ICD-10-CM | POA: Insufficient documentation

## 2013-05-07 DIAGNOSIS — Y846 Urinary catheterization as the cause of abnormal reaction of the patient, or of later complication, without mention of misadventure at the time of the procedure: Secondary | ICD-10-CM | POA: Insufficient documentation

## 2013-05-07 DIAGNOSIS — Z8619 Personal history of other infectious and parasitic diseases: Secondary | ICD-10-CM | POA: Insufficient documentation

## 2013-05-07 DIAGNOSIS — R011 Cardiac murmur, unspecified: Secondary | ICD-10-CM | POA: Insufficient documentation

## 2013-05-07 DIAGNOSIS — Z872 Personal history of diseases of the skin and subcutaneous tissue: Secondary | ICD-10-CM | POA: Insufficient documentation

## 2013-05-07 DIAGNOSIS — Z8669 Personal history of other diseases of the nervous system and sense organs: Secondary | ICD-10-CM | POA: Insufficient documentation

## 2013-05-07 DIAGNOSIS — M129 Arthropathy, unspecified: Secondary | ICD-10-CM | POA: Insufficient documentation

## 2013-05-07 DIAGNOSIS — N400 Enlarged prostate without lower urinary tract symptoms: Secondary | ICD-10-CM | POA: Insufficient documentation

## 2013-05-07 DIAGNOSIS — G40909 Epilepsy, unspecified, not intractable, without status epilepticus: Secondary | ICD-10-CM | POA: Insufficient documentation

## 2013-05-07 DIAGNOSIS — Z87891 Personal history of nicotine dependence: Secondary | ICD-10-CM | POA: Insufficient documentation

## 2013-05-07 DIAGNOSIS — E119 Type 2 diabetes mellitus without complications: Secondary | ICD-10-CM | POA: Insufficient documentation

## 2013-05-07 DIAGNOSIS — Z8701 Personal history of pneumonia (recurrent): Secondary | ICD-10-CM | POA: Insufficient documentation

## 2013-05-07 DIAGNOSIS — T83091A Other mechanical complication of indwelling urethral catheter, initial encounter: Secondary | ICD-10-CM | POA: Insufficient documentation

## 2013-05-07 DIAGNOSIS — T83011A Breakdown (mechanical) of indwelling urethral catheter, initial encounter: Secondary | ICD-10-CM

## 2013-05-07 DIAGNOSIS — I739 Peripheral vascular disease, unspecified: Secondary | ICD-10-CM | POA: Insufficient documentation

## 2013-05-07 DIAGNOSIS — Z951 Presence of aortocoronary bypass graft: Secondary | ICD-10-CM | POA: Insufficient documentation

## 2013-05-07 DIAGNOSIS — M109 Gout, unspecified: Secondary | ICD-10-CM | POA: Insufficient documentation

## 2013-05-07 DIAGNOSIS — G4700 Insomnia, unspecified: Secondary | ICD-10-CM | POA: Insufficient documentation

## 2013-05-07 DIAGNOSIS — Z7902 Long term (current) use of antithrombotics/antiplatelets: Secondary | ICD-10-CM | POA: Insufficient documentation

## 2013-05-07 DIAGNOSIS — Z79899 Other long term (current) drug therapy: Secondary | ICD-10-CM | POA: Insufficient documentation

## 2013-05-07 DIAGNOSIS — I252 Old myocardial infarction: Secondary | ICD-10-CM | POA: Insufficient documentation

## 2013-05-07 DIAGNOSIS — G8929 Other chronic pain: Secondary | ICD-10-CM | POA: Insufficient documentation

## 2013-05-07 DIAGNOSIS — N189 Chronic kidney disease, unspecified: Secondary | ICD-10-CM | POA: Insufficient documentation

## 2013-05-07 DIAGNOSIS — Z8673 Personal history of transient ischemic attack (TIA), and cerebral infarction without residual deficits: Secondary | ICD-10-CM | POA: Insufficient documentation

## 2013-05-07 DIAGNOSIS — F3289 Other specified depressive episodes: Secondary | ICD-10-CM | POA: Insufficient documentation

## 2013-05-07 DIAGNOSIS — Z8614 Personal history of Methicillin resistant Staphylococcus aureus infection: Secondary | ICD-10-CM | POA: Insufficient documentation

## 2013-05-07 NOTE — ED Notes (Signed)
Pt presents to ED with a complaint of dysuria. Pt has a catheter in place due to hematuria.  Catheter was placed Thursday the 26th.  Pt states that this is the fifth time it has clogged.

## 2013-05-07 NOTE — ED Notes (Signed)
Foley catheter irrigated without difficulty with 190 ml sterile water via aseptic technique. Patient tolerated well.

## 2013-05-07 NOTE — ED Provider Notes (Signed)
CSN: [id number]     Arrival date & time 05/07/13  1858 History   First MD Initiated Contact with Patient 05/07/13 2017     Chief Complaint  Patient presents with  . Dysuria   (Consider location/radiation/quality/duration/timing/severity/associated sxs/prior Treatment) HPI This 72 year old male has had ongoing gross hematuria with intermittent urinary retention from obstructed Foley catheters, he has a chronic indwelling Foley catheter which today is much improved gross hematuria with only minimal intermittent bloody urine however this evening appears to have stopped draining urine again and thinks he might have a blocked Foley catheter again even though his urine is fairly clear tonight, he is no fever no vomiting no abdominal pain no chest pain no shortness breath no other concerns. There's no treatment prior to arrival. Past Medical History  Diagnosis Date  . Hypertension   . CVA (cerebral infarction)   . Epilepsy   . Peripheral vascular disease   . Ulcer     left foot  . Cellulitis   . Chronic kidney disease   . Dyslipidemia   . Chronic venous insufficiency   . Sleep apnea   . Morbid obesity   . Diverticulosis   . Benign prostatic hypertrophy   . Glaucoma   . Staphylococcus aureus infection   . Gout   . Depression   . Insomnia   . Coronary artery disease   . CAD (coronary artery disease), native coronary artery   . GERD (gastroesophageal reflux disease)   . Heart murmur   . Myocardial infarction ~ 11/2011    "in ICU @ [facility]; stress-related"  . Angina   . Pneumonia 12/11/11  . Shortness of breath 12/11/11    "mostly w/exertion"  . Type II diabetes mellitus   . Chronic daily headache     "~ all the time"  . Seizures     hx; "don't know from what"  . Arthritis   . CHF (congestive heart failure) May 2013  . Stroke ~ 1983    "little weaker right hand"  . Recent heart attack 12/09/11    Stress heart attack  . MRSA (methicillin resistant staph aureus) culture positive  Dec. 17, 2013     right heel infection   Past Surgical History  Procedure Laterality Date  . Appendectomy    . Lumbar laminectomy    . Mastoid debridement    . Knee and ankle surgery  1979    "caved in in a ditch; messed up legs"; right  . Cholecystectomy    . Back surgery      "5 or 6"  . Peripheral arterial stent graft      "don't remember which side"  . Coronary artery bypass graft  1983; 1992    "CABG X 5; CABG X 8"  . Lower extremity angiogram  01/21/2012    Procedure: LOWER EXTREMITY ANGIOGRAM;  Surgeon: Nada Libman, MD;  Location: [facility] OR;  Service: Vascular;  Laterality: Bilateral;  WITH POSSIBLE INTERVENTION  . Eye surgery      "laser; both eyes"  . Eye surgery  05/08/2012    Cataract Left eye  . Heel bx  Dec. 17, 2013    Right Heel Bx.     Family History  Problem Relation Age of Onset  . Hypertension Other   . Cancer Other   . Cancer Sister   . Deep vein thrombosis Brother   . Heart disease Brother   . Deep vein thrombosis Brother   . Heart disease Brother   .  Cancer Sister    History  Substance Use Topics  . Smoking status: Former Smoker -- 1.00 packs/day for 20 years    Types: Cigarettes    Quit date: 07/29/1991  . Smokeless tobacco: Former Neurosurgeon    Types: Chew    Quit date: 08/06/1987  . Alcohol Use: No     Comment: occasionallly    Review of Systems 10 Systems reviewed and are negative for acute change except as noted in the HPI. Allergies  Contrast media  Home Medications   Current Outpatient Rx  Name  Route  Sig  Dispense  Refill  . allopurinol (ZYLOPRIM) 300 MG tablet   Oral   Take 300 mg by mouth every morning.          Marland Kitchen ALPRAZolam (XANAX) 1 MG tablet   Oral   Take 0.5-2 mg by mouth 3 (three) times daily. Takes half tablet in the morning and at 430 pm and 2 tablets at bedtime.         Marland Kitchen atenolol (TENORMIN) 50 MG tablet   Oral   Take 50 mg by mouth 2 (two) times daily.          . brimonidine-timolol (COMBIGAN) 0.2-0.5 %  ophthalmic solution   Both Eyes   Place 1 drop into both eyes at bedtime.          . clopidogrel (PLAVIX) 75 MG tablet   Oral   Take 75 mg by mouth every morning.          . diclofenac sodium (VOLTAREN) 1 % GEL   Topical   Apply 1 application topically 4 (four) times daily as needed. Applies to both knees as needed for pain         . furosemide (LASIX) 80 MG tablet   Oral   Take 160 mg by mouth every morning.          Marland Kitchen glipiZIDE (GLUCOTROL) 5 MG tablet   Oral   Take 5 mg by mouth 2 (two) times daily before a meal.           . HYDROcodone-acetaminophen (VICODIN ES) 7.5-750 MG per tablet   Oral   Take 1 tablet by mouth every 6 (six) hours as needed. For pain         . insulin glargine (LANTUS) 100 UNIT/ML injection   Subcutaneous   Inject 5-10 Units into the skin 2 (two) times daily as needed. Per sliding scale in divided doses. May use up to 140 units/day. Usual dose is 5-10 units         . isosorbide mononitrate (IMDUR) 30 MG 24 hr tablet   Oral   Take 30 mg by mouth every morning.          . latanoprost (XALATAN) 0.005 % ophthalmic solution   Both Eyes   Place 1 drop into both eyes at bedtime.           . nitroGLYCERIN (NITROSTAT) 0.4 MG SL tablet   Sublingual   Place 0.4 mg under the tongue every 5 (five) minutes as needed. For chest pain         . omeprazole (PRILOSEC) 20 MG capsule   Oral   Take 20 mg by mouth 2 (two) times daily.          . potassium chloride SA (K-DUR,KLOR-CON) 20 MEQ tablet   Oral   Take 60 mEq by mouth 3 (three) times daily.          Marland Kitchen  simvastatin (ZOCOR) 20 MG tablet   Oral   Take 20 mg by mouth every evening.          . temazepam (RESTORIL) 30 MG capsule   Oral   Take 30 mg by mouth at bedtime.          . cephALEXin (KEFLEX) 500 MG capsule   Oral   Take 1 capsule (500 mg total) by mouth 4 (four) times daily.   28 capsule   0    BP 163/68  Pulse 63  Temp(Src) 97.3 F (36.3 C) (Oral)  Resp 19   SpO2 96% Physical Exam  Nursing note and vitals reviewed. Constitutional:  Awake, alert, nontoxic appearance.  HENT:  Head: Atraumatic.  Eyes: Right eye exhibits no discharge. Left eye exhibits no discharge.  Neck: Neck supple.  Cardiovascular: Normal rate and regular rhythm.   No murmur heard. Pulmonary/Chest: Effort normal and breath sounds normal. No respiratory distress. He has no wheezes. He has no rales. He exhibits no tenderness.  Abdominal: Soft. Bowel sounds are normal. He exhibits no distension and no mass. There is no tenderness. There is no rebound and no guarding.  Limited bedside ultrasound reveals no obvious distended bladder; appears to have small amount of fluid with Foley catheter tip visualized  Musculoskeletal: He exhibits no tenderness.  Baseline ROM, no obvious new focal weakness.  Neurological:  Mental status and motor strength appears baseline for patient and situation.  Skin: No rash noted.  Psychiatric: He has a normal mood and affect.    ED Course  Procedures (including critical care time) Patient feels better in ED, Foley catheter is draining clear urine after irrigation by nursing.Patient / Family / Caregiver informed of clinical course, understand medical decision-making process, and agree with plan. Labs Review Labs Reviewed - No data to display Imaging Review No results found.  MDM   1. Malfunction of Foley catheter, initial encounter    I doubt any other EMC precluding discharge at this time including, but not necessarily limited to the following:urinary retention.    Hurman Horn, MD 05/08/13 1346

## 2013-05-30 ENCOUNTER — Encounter (HOSPITAL_COMMUNITY): Payer: Self-pay | Admitting: Emergency Medicine

## 2013-05-30 ENCOUNTER — Inpatient Hospital Stay (HOSPITAL_COMMUNITY): Payer: Medicare Other

## 2013-05-30 ENCOUNTER — Emergency Department (HOSPITAL_COMMUNITY): Payer: Medicare Other

## 2013-05-30 ENCOUNTER — Inpatient Hospital Stay (HOSPITAL_COMMUNITY)
Admission: EM | Admit: 2013-05-30 | Discharge: 2013-06-03 | DRG: 280 | Disposition: A | Discharge status: Home / Self Care | Payer: Medicare Other | Attending: Internal Medicine | Admitting: Internal Medicine

## 2013-05-30 DIAGNOSIS — Z8673 Personal history of transient ischemic attack (TIA), and cerebral infarction without residual deficits: Secondary | ICD-10-CM

## 2013-05-30 DIAGNOSIS — E875 Hyperkalemia: Secondary | ICD-10-CM | POA: Diagnosis not present

## 2013-05-30 DIAGNOSIS — Z87891 Personal history of nicotine dependence: Secondary | ICD-10-CM

## 2013-05-30 DIAGNOSIS — E662 Morbid (severe) obesity with alveolar hypoventilation: Secondary | ICD-10-CM | POA: Diagnosis present

## 2013-05-30 DIAGNOSIS — H409 Unspecified glaucoma: Secondary | ICD-10-CM | POA: Diagnosis present

## 2013-05-30 DIAGNOSIS — E1149 Type 2 diabetes mellitus with other diabetic neurological complication: Secondary | ICD-10-CM | POA: Diagnosis present

## 2013-05-30 DIAGNOSIS — I739 Peripheral vascular disease, unspecified: Secondary | ICD-10-CM | POA: Diagnosis present

## 2013-05-30 DIAGNOSIS — E11621 Type 2 diabetes mellitus with foot ulcer: Secondary | ICD-10-CM

## 2013-05-30 DIAGNOSIS — N058 Unspecified nephritic syndrome with other morphologic changes: Secondary | ICD-10-CM | POA: Diagnosis present

## 2013-05-30 DIAGNOSIS — I248 Other forms of acute ischemic heart disease: Secondary | ICD-10-CM | POA: Diagnosis present

## 2013-05-30 DIAGNOSIS — Z9119 Patient's noncompliance with other medical treatment and regimen: Secondary | ICD-10-CM

## 2013-05-30 DIAGNOSIS — I129 Hypertensive chronic kidney disease with stage 1 through stage 4 chronic kidney disease, or unspecified chronic kidney disease: Secondary | ICD-10-CM | POA: Diagnosis present

## 2013-05-30 DIAGNOSIS — N400 Enlarged prostate without lower urinary tract symptoms: Secondary | ICD-10-CM | POA: Diagnosis present

## 2013-05-30 DIAGNOSIS — E1159 Type 2 diabetes mellitus with other circulatory complications: Secondary | ICD-10-CM | POA: Diagnosis present

## 2013-05-30 DIAGNOSIS — I5032 Chronic diastolic (congestive) heart failure: Secondary | ICD-10-CM | POA: Diagnosis present

## 2013-05-30 DIAGNOSIS — Z8614 Personal history of Methicillin resistant Staphylococcus aureus infection: Secondary | ICD-10-CM

## 2013-05-30 DIAGNOSIS — L03116 Cellulitis of left lower limb: Secondary | ICD-10-CM

## 2013-05-30 DIAGNOSIS — J4489 Other specified chronic obstructive pulmonary disease: Secondary | ICD-10-CM | POA: Diagnosis present

## 2013-05-30 DIAGNOSIS — I2510 Atherosclerotic heart disease of native coronary artery without angina pectoris: Secondary | ICD-10-CM

## 2013-05-30 DIAGNOSIS — L02419 Cutaneous abscess of limb, unspecified: Secondary | ICD-10-CM | POA: Diagnosis present

## 2013-05-30 DIAGNOSIS — Z9981 Dependence on supplemental oxygen: Secondary | ICD-10-CM

## 2013-05-30 DIAGNOSIS — L8992 Pressure ulcer of unspecified site, stage 2: Secondary | ICD-10-CM | POA: Diagnosis present

## 2013-05-30 DIAGNOSIS — J9620 Acute and chronic respiratory failure, unspecified whether with hypoxia or hypercapnia: Secondary | ICD-10-CM | POA: Diagnosis present

## 2013-05-30 DIAGNOSIS — E785 Hyperlipidemia, unspecified: Secondary | ICD-10-CM | POA: Diagnosis present

## 2013-05-30 DIAGNOSIS — E1129 Type 2 diabetes mellitus with other diabetic kidney complication: Secondary | ICD-10-CM | POA: Diagnosis present

## 2013-05-30 DIAGNOSIS — Z66 Do not resuscitate: Secondary | ICD-10-CM | POA: Clinically undetermined

## 2013-05-30 DIAGNOSIS — Z6841 Body Mass Index (BMI) 40.0 and over, adult: Secondary | ICD-10-CM

## 2013-05-30 DIAGNOSIS — L97509 Non-pressure chronic ulcer of other part of unspecified foot with unspecified severity: Secondary | ICD-10-CM

## 2013-05-30 DIAGNOSIS — E1169 Type 2 diabetes mellitus with other specified complication: Secondary | ICD-10-CM

## 2013-05-30 DIAGNOSIS — Z951 Presence of aortocoronary bypass graft: Secondary | ICD-10-CM

## 2013-05-30 DIAGNOSIS — I5043 Acute on chronic combined systolic (congestive) and diastolic (congestive) heart failure: Principal | ICD-10-CM | POA: Diagnosis present

## 2013-05-30 DIAGNOSIS — I2489 Other forms of acute ischemic heart disease: Secondary | ICD-10-CM | POA: Diagnosis present

## 2013-05-30 DIAGNOSIS — I5022 Chronic systolic (congestive) heart failure: Secondary | ICD-10-CM

## 2013-05-30 DIAGNOSIS — J9600 Acute respiratory failure, unspecified whether with hypoxia or hypercapnia: Secondary | ICD-10-CM | POA: Diagnosis present

## 2013-05-30 DIAGNOSIS — I214 Non-ST elevation (NSTEMI) myocardial infarction: Secondary | ICD-10-CM

## 2013-05-30 DIAGNOSIS — Z91199 Patient's noncompliance with other medical treatment and regimen due to unspecified reason: Secondary | ICD-10-CM

## 2013-05-30 DIAGNOSIS — I252 Old myocardial infarction: Secondary | ICD-10-CM

## 2013-05-30 DIAGNOSIS — N183 Chronic kidney disease, stage 3 unspecified: Secondary | ICD-10-CM | POA: Diagnosis present

## 2013-05-30 DIAGNOSIS — I2589 Other forms of chronic ischemic heart disease: Secondary | ICD-10-CM | POA: Diagnosis present

## 2013-05-30 DIAGNOSIS — L89109 Pressure ulcer of unspecified part of back, unspecified stage: Secondary | ICD-10-CM | POA: Diagnosis present

## 2013-05-30 DIAGNOSIS — G4733 Obstructive sleep apnea (adult) (pediatric): Secondary | ICD-10-CM | POA: Diagnosis present

## 2013-05-30 DIAGNOSIS — E1121 Type 2 diabetes mellitus with diabetic nephropathy: Secondary | ICD-10-CM | POA: Diagnosis present

## 2013-05-30 DIAGNOSIS — K219 Gastro-esophageal reflux disease without esophagitis: Secondary | ICD-10-CM | POA: Diagnosis present

## 2013-05-30 DIAGNOSIS — G40909 Epilepsy, unspecified, not intractable, without status epilepticus: Secondary | ICD-10-CM | POA: Diagnosis present

## 2013-05-30 DIAGNOSIS — R0602 Shortness of breath: Secondary | ICD-10-CM

## 2013-05-30 DIAGNOSIS — E1142 Type 2 diabetes mellitus with diabetic polyneuropathy: Secondary | ICD-10-CM | POA: Diagnosis present

## 2013-05-30 DIAGNOSIS — M109 Gout, unspecified: Secondary | ICD-10-CM | POA: Diagnosis present

## 2013-05-30 DIAGNOSIS — I509 Heart failure, unspecified: Secondary | ICD-10-CM | POA: Diagnosis present

## 2013-05-30 DIAGNOSIS — J449 Chronic obstructive pulmonary disease, unspecified: Secondary | ICD-10-CM | POA: Diagnosis present

## 2013-05-30 LAB — POCT I-STAT 3, ART BLOOD GAS (G3+)
Bicarbonate: 23.8 mEq/L (ref 20.0–24.0)
Bicarbonate: 23.3 mEq/L (ref 20.0–24.0)
O2 Saturation: 100 %
O2 Saturation: 99 %
Patient temperature: 98.6
Patient temperature: 98.6
TCO2: 25 mmol/L (ref 0–100)
pCO2 arterial: 62.2 mmHg (ref 35.0–45.0)
pH, Arterial: 7.191 — CL (ref 7.350–7.450)
pH, Arterial: 7.267 — ABNORMAL LOW (ref 7.350–7.450)

## 2013-05-30 LAB — TROPONIN I: Troponin I: 1.35 ng/mL (ref <0.30)

## 2013-05-30 LAB — CBC WITH DIFFERENTIAL/PLATELET
Basophils Relative: 0 % (ref 0–1)
Eosinophils Absolute: 0.4 10*3/uL (ref 0.0–0.7)
Eosinophils Relative: 3 % (ref 0–5)
Hemoglobin: 12.5 g/dL — ABNORMAL LOW (ref 13.0–17.0)
Lymphs Abs: 3.1 10*3/uL (ref 0.7–4.0)
MCH: 29.6 pg (ref 26.0–34.0)
MCHC: 31.6 g/dL (ref 30.0–36.0)
MCV: 93.6 fL (ref 78.0–100.0)
Monocytes Absolute: 0.7 10*3/uL (ref 0.1–1.0)
Monocytes Relative: 5 % (ref 3–12)
Neutrophils Relative %: 68 % (ref 43–77)
RBC: 4.22 MIL/uL (ref 4.22–5.81)
RDW: 15.8 % — ABNORMAL HIGH (ref 11.5–15.5)
WBC: 13.2 10*3/uL — ABNORMAL HIGH (ref 4.0–10.5)

## 2013-05-30 LAB — BASIC METABOLIC PANEL
BUN: 18 mg/dL (ref 6–23)
CO2: 26 mEq/L (ref 19–32)
Chloride: 105 mEq/L (ref 96–112)
Creatinine, Ser: 1.31 mg/dL (ref 0.50–1.35)
GFR calc Af Amer: 62 mL/min — ABNORMAL LOW (ref >90)
Potassium: 5.5 mEq/L — ABNORMAL HIGH (ref 3.5–5.1)
Sodium: 141 mEq/L (ref 135–145)

## 2013-05-30 LAB — HEPARIN LEVEL (UNFRACTIONATED): Heparin Unfractionated: <0.1 IU/mL — ABNORMAL LOW (ref 0.30–0.70)

## 2013-05-30 LAB — COMPREHENSIVE METABOLIC PANEL
Albumin: 2.9 g/dL — ABNORMAL LOW (ref 3.5–5.2)
Alkaline Phosphatase: 117 U/L (ref 39–117)
BUN: 18 mg/dL (ref 6–23)
Calcium: 8.9 mg/dL (ref 8.4–10.5)
Creatinine, Ser: 1.35 mg/dL (ref 0.50–1.35)
GFR calc Af Amer: 59 mL/min — ABNORMAL LOW (ref >90)
GFR calc non Af Amer: 51 mL/min — ABNORMAL LOW (ref >90)
Glucose, Bld: 198 mg/dL — ABNORMAL HIGH (ref 70–99)
Total Protein: 7.7 g/dL (ref 6.0–8.3)

## 2013-05-30 LAB — PRO B NATRIURETIC PEPTIDE: Pro B Natriuretic peptide (BNP): 2668 pg/mL — ABNORMAL HIGH (ref 0–125)

## 2013-05-30 LAB — PROTIME-INR: INR: 1.08 (ref 0.00–1.49)

## 2013-05-30 LAB — MAGNESIUM: Magnesium: 1.8 mg/dL (ref 1.5–2.5)

## 2013-05-30 MED ORDER — INSULIN ASPART 100 UNIT/ML ~~LOC~~ SOLN: 10.0000 [IU] | Freq: Once | SUBCUTANEOUS | Status: DC

## 2013-05-30 MED ORDER — SODIUM CHLORIDE 0.9 % IV SOLN
1.0000 g | Freq: Once | INTRAVENOUS | Status: AC
  Administered 2013-05-30: 1 g via INTRAVENOUS
  Filled 2013-05-30: qty 10

## 2013-05-30 MED ORDER — INFLUENZA VAC SPLIT QUAD 0.5 ML IM SUSP
0.5000 mL | INTRAMUSCULAR | Status: DC
  Filled 2013-05-30 (×2): qty 0.5

## 2013-05-30 MED ORDER — FUROSEMIDE 10 MG/ML IJ SOLN
80.0000 mg | Freq: Once | INTRAMUSCULAR | Status: AC
  Administered 2013-05-30: 80 mg via INTRAVENOUS
  Filled 2013-05-30: qty 8

## 2013-05-30 MED ORDER — METHYLPREDNISOLONE SODIUM SUCC 125 MG IJ SOLR
125.0000 mg | Freq: Once | INTRAMUSCULAR | Status: AC
Start: 2013-05-30 — End: 2013-05-30
  Administered 2013-05-30: 125 mg via INTRAVENOUS
  Filled 2013-05-30: qty 2

## 2013-05-30 MED ORDER — CLOPIDOGREL BISULFATE 75 MG PO TABS
75.0000 mg | ORAL_TABLET | Freq: Every day | ORAL | Status: DC
  Administered 2013-05-31 – 2013-06-03 (×4): 75 mg via ORAL
  Filled 2013-05-30 (×5): qty 1

## 2013-05-30 MED ORDER — TIMOLOL MALEATE 0.5 % OP SOLN
1.0000 [drp] | Freq: Every day | OPHTHALMIC | Status: DC
  Administered 2013-05-30 – 2013-06-02 (×4): 1 [drp] via OPHTHALMIC
  Filled 2013-05-30: qty 5

## 2013-05-30 MED ORDER — IPRATROPIUM BROMIDE 0.02 % IN SOLN
0.5000 mg | Freq: Once | RESPIRATORY_TRACT | Status: AC
  Administered 2013-05-30: 0.5 mg via RESPIRATORY_TRACT
  Filled 2013-05-30: qty 2.5

## 2013-05-30 MED ORDER — INSULIN ASPART 100 UNIT/ML ~~LOC~~ SOLN
0.0000 [IU] | Freq: Every day | SUBCUTANEOUS | Status: DC
  Administered 2013-05-30: 2 [IU] via SUBCUTANEOUS

## 2013-05-30 MED ORDER — SODIUM CHLORIDE 0.9 % IJ SOLN
3.0000 mL | Freq: Two times a day (BID) | INTRAMUSCULAR | Status: DC
  Administered 2013-05-31 – 2013-06-02 (×7): 3 mL via INTRAVENOUS

## 2013-05-30 MED ORDER — ASPIRIN EC 81 MG PO TBEC
81.0000 mg | DELAYED_RELEASE_TABLET | Freq: Every day | ORAL | Status: DC
  Administered 2013-05-31 – 2013-06-03 (×4): 81 mg via ORAL
  Filled 2013-05-30 (×4): qty 1

## 2013-05-30 MED ORDER — INSULIN ASPART 100 UNIT/ML ~~LOC~~ SOLN
0.0000 [IU] | Freq: Three times a day (TID) | SUBCUTANEOUS | Status: DC
  Administered 2013-05-31 (×2): 1 [IU] via SUBCUTANEOUS
  Administered 2013-05-31: 3 [IU] via SUBCUTANEOUS
  Administered 2013-06-01 (×2): 2 [IU] via SUBCUTANEOUS

## 2013-05-30 MED ORDER — INSULIN ASPART 100 UNIT/ML ~~LOC~~ SOLN
10.0000 [IU] | Freq: Once | SUBCUTANEOUS | Status: AC
  Administered 2013-05-30: 10 [IU] via INTRAVENOUS

## 2013-05-30 MED ORDER — ALPRAZOLAM 0.5 MG PO TABS
0.5000 mg | ORAL_TABLET | ORAL | Status: DC
  Administered 2013-05-31 – 2013-06-03 (×7): 0.5 mg via ORAL
  Filled 2013-05-30: qty 1
  Filled 2013-05-30: qty 2
  Filled 2013-05-30: qty 1
  Filled 2013-05-30 (×2): qty 2
  Filled 2013-05-30 (×2): qty 1

## 2013-05-30 MED ORDER — METOPROLOL TARTRATE 25 MG PO TABS
25.0000 mg | ORAL_TABLET | Freq: Two times a day (BID) | ORAL | Status: DC
  Filled 2013-05-30: qty 1

## 2013-05-30 MED ORDER — ASPIRIN 300 MG RE SUPP
300.0000 mg | Freq: Once | RECTAL | Status: AC
  Administered 2013-05-30: 300 mg via RECTAL
  Filled 2013-05-30: qty 1

## 2013-05-30 MED ORDER — HEPARIN BOLUS VIA INFUSION
2000.0000 [IU] | Freq: Once | INTRAVENOUS | Status: AC
  Administered 2013-05-30: 2000 [IU] via INTRAVENOUS
  Filled 2013-05-30: qty 2000

## 2013-05-30 MED ORDER — ATENOLOL 50 MG PO TABS
50.0000 mg | ORAL_TABLET | Freq: Two times a day (BID) | ORAL | Status: DC
  Administered 2013-05-30 – 2013-06-03 (×8): 50 mg via ORAL
  Filled 2013-05-30 (×9): qty 1

## 2013-05-30 MED ORDER — ONDANSETRON HCL 4 MG PO TABS: 4.0000 mg | ORAL_TABLET | Freq: Four times a day (QID) | ORAL | Status: DC | PRN

## 2013-05-30 MED ORDER — POLYETHYLENE GLYCOL 3350 17 G PO PACK
17.0000 g | PACK | Freq: Every day | ORAL | Status: DC | PRN
  Filled 2013-05-30: qty 1

## 2013-05-30 MED ORDER — ALPRAZOLAM 0.25 MG PO TABS: 0.5000 mg | ORAL_TABLET | Freq: Three times a day (TID) | ORAL | Status: DC

## 2013-05-30 MED ORDER — BRIMONIDINE TARTRATE-TIMOLOL 0.2-0.5 % OP SOLN: 1.0000 [drp] | Freq: Every day | OPHTHALMIC | Status: DC

## 2013-05-30 MED ORDER — FUROSEMIDE 10 MG/ML IJ SOLN: 40.0000 mg | Freq: Once | INTRAMUSCULAR | Status: DC

## 2013-05-30 MED ORDER — ALPRAZOLAM 0.5 MG PO TABS
2.0000 mg | ORAL_TABLET | Freq: Every day | ORAL | Status: DC
  Administered 2013-05-30 – 2013-06-02 (×4): 2 mg via ORAL
  Filled 2013-05-30: qty 8
  Filled 2013-05-30 (×2): qty 4
  Filled 2013-05-30 (×2): qty 8

## 2013-05-30 MED ORDER — NITROGLYCERIN 0.4 MG SL SUBL: 0.4000 mg | SUBLINGUAL_TABLET | SUBLINGUAL | Status: DC | PRN

## 2013-05-30 MED ORDER — FUROSEMIDE 10 MG/ML IJ SOLN
80.0000 mg | Freq: Two times a day (BID) | INTRAMUSCULAR | Status: DC
  Administered 2013-05-30 – 2013-06-01 (×5): 80 mg via INTRAVENOUS
  Filled 2013-05-30 (×8): qty 8

## 2013-05-30 MED ORDER — NITROGLYCERIN 2 % TD OINT
1.0000 [in_us] | TOPICAL_OINTMENT | Freq: Four times a day (QID) | TRANSDERMAL | Status: DC
  Administered 2013-05-30 – 2013-05-31 (×4): 1 [in_us] via TOPICAL
  Filled 2013-05-30: qty 30
  Filled 2013-05-30: qty 1

## 2013-05-30 MED ORDER — VANCOMYCIN HCL IN DEXTROSE 1-5 GM/200ML-% IV SOLN
1000.0000 mg | Freq: Once | INTRAVENOUS | Status: AC
  Administered 2013-05-30: 1000 mg via INTRAVENOUS
  Filled 2013-05-30: qty 200

## 2013-05-30 MED ORDER — ALBUTEROL SULFATE (5 MG/ML) 0.5% IN NEBU: 2.5000 mg | INHALATION_SOLUTION | RESPIRATORY_TRACT | Status: DC | PRN

## 2013-05-30 MED ORDER — SODIUM POLYSTYRENE SULFONATE 15 GM/60ML PO SUSP
30.0000 g | Freq: Once | ORAL | Status: AC
  Administered 2013-05-30: 30 g via ORAL
  Filled 2013-05-30: qty 120

## 2013-05-30 MED ORDER — HEPARIN (PORCINE) IN NACL 100-0.45 UNIT/ML-% IJ SOLN
1900.0000 [IU]/h | INTRAMUSCULAR | Status: DC
  Administered 2013-05-30: 1700 [IU]/h via INTRAVENOUS
  Administered 2013-05-30 (×2): 1400 [IU]/h via INTRAVENOUS
  Filled 2013-05-30 (×3): qty 250

## 2013-05-30 MED ORDER — HEPARIN BOLUS VIA INFUSION
4000.0000 [IU] | Freq: Once | INTRAVENOUS | Status: AC
  Administered 2013-05-30: 4000 [IU] via INTRAVENOUS
  Filled 2013-05-30: qty 4000

## 2013-05-30 MED ORDER — ATORVASTATIN CALCIUM 10 MG PO TABS
10.0000 mg | ORAL_TABLET | Freq: Every day | ORAL | Status: DC
  Administered 2013-05-30: 10 mg via ORAL
  Filled 2013-05-30 (×2): qty 1

## 2013-05-30 MED ORDER — GUAIFENESIN-DM 100-10 MG/5ML PO SYRP
5.0000 mL | ORAL_SOLUTION | ORAL | Status: DC | PRN
  Filled 2013-05-30: qty 5

## 2013-05-30 MED ORDER — ALLOPURINOL 300 MG PO TABS
300.0000 mg | ORAL_TABLET | Freq: Every morning | ORAL | Status: DC
  Administered 2013-05-31 – 2013-06-03 (×4): 300 mg via ORAL
  Filled 2013-05-30 (×4): qty 1

## 2013-05-30 MED ORDER — MORPHINE SULFATE 2 MG/ML IJ SOLN: 2.0000 mg | INTRAMUSCULAR | Status: DC | PRN

## 2013-05-30 MED ORDER — TEMAZEPAM 15 MG PO CAPS
30.0000 mg | ORAL_CAPSULE | Freq: Every day | ORAL | Status: DC
  Administered 2013-05-30 – 2013-06-02 (×4): 30 mg via ORAL
  Filled 2013-05-30 (×4): qty 2

## 2013-05-30 MED ORDER — VANCOMYCIN HCL 10 G IV SOLR
1500.0000 mg | Freq: Once | INTRAVENOUS | Status: AC
  Administered 2013-05-30: 1500 mg via INTRAVENOUS
  Filled 2013-05-30: qty 1500

## 2013-05-30 MED ORDER — ALBUTEROL (5 MG/ML) CONTINUOUS INHALATION SOLN
10.0000 mg/h | INHALATION_SOLUTION | Freq: Once | RESPIRATORY_TRACT | Status: AC
  Administered 2013-05-30: 10 mg/h via RESPIRATORY_TRACT
  Filled 2013-05-30: qty 20

## 2013-05-30 MED ORDER — VANCOMYCIN HCL 10 G IV SOLR
1750.0000 mg | INTRAVENOUS | Status: DC
  Filled 2013-05-30: qty 1750

## 2013-05-30 MED ORDER — ONDANSETRON HCL 4 MG/2ML IJ SOLN: 4.0000 mg | Freq: Four times a day (QID) | INTRAMUSCULAR | Status: DC | PRN

## 2013-05-30 MED ORDER — ASPIRIN EC 325 MG PO TBEC: 325.0000 mg | DELAYED_RELEASE_TABLET | Freq: Every day | ORAL | Status: DC

## 2013-05-30 MED ORDER — SIMVASTATIN 20 MG PO TABS
20.0000 mg | ORAL_TABLET | Freq: Every evening | ORAL | Status: DC
  Administered 2013-05-31 – 2013-06-02 (×3): 20 mg via ORAL
  Filled 2013-05-30 (×4): qty 1

## 2013-05-30 MED ORDER — MAGNESIUM SULFATE 40 MG/ML IJ SOLN
2.0000 g | Freq: Once | INTRAMUSCULAR | Status: AC
  Administered 2013-05-30: 2 g via INTRAVENOUS
  Filled 2013-05-30: qty 50

## 2013-05-30 MED ORDER — HYDROCODONE-ACETAMINOPHEN 5-325 MG PO TABS: 1.0000 | ORAL_TABLET | ORAL | Status: DC | PRN

## 2013-05-30 MED ORDER — DEXTROSE 50 % IV SOLN
50.0000 mL | Freq: Once | INTRAVENOUS | Status: AC
  Administered 2013-05-30: 50 mL via INTRAVENOUS
  Filled 2013-05-30: qty 50

## 2013-05-30 MED ORDER — DEXTROSE 50 % IV SOLN
25.0000 mL | Freq: Once | INTRAVENOUS | Status: AC
  Administered 2013-05-30: 25 mL via INTRAVENOUS
  Filled 2013-05-30: qty 50

## 2013-05-30 MED ORDER — PANTOPRAZOLE SODIUM 40 MG PO TBEC
40.0000 mg | DELAYED_RELEASE_TABLET | Freq: Every day | ORAL | Status: DC
  Administered 2013-05-31 – 2013-06-03 (×4): 40 mg via ORAL
  Filled 2013-05-30 (×4): qty 1

## 2013-05-30 MED ORDER — BRIMONIDINE TARTRATE 0.2 % OP SOLN
1.0000 [drp] | Freq: Every day | OPHTHALMIC | Status: DC
  Administered 2013-05-30 – 2013-06-02 (×4): 1 [drp] via OPHTHALMIC
  Filled 2013-05-30: qty 5

## 2013-05-30 NOTE — ED Provider Notes (Signed)
Pt. on Bipap upon my arrival to ED, repeat abg obtained per Dr. Brandy Hale request, CAT finished@ 07:41 FiO2 weaned down to 35%, changed EMS full face mask to hospitals Medium full face.

## 2013-05-30 NOTE — ED Provider Notes (Addendum)
CSN: [id number]     Arrival date & time 05/30/13  0604 History   First MD Initiated Contact with Patient 05/30/13 [phone]     Chief Complaint  Patient presents with  . Shortness of Breath   (Consider location/radiation/quality/duration/timing/severity/associated sxs/prior Treatment) HPI Comments: Pt comes in with cc of dib. Pt has hx of CHF, EF 30%, denies any COPD. LEVEL 5 CAVEAT -PATIENT UNSTABLE, ON BIPAP States that he started having some dib 2 days ago. Denies cough. Denies chest pain. EMS started patient on BiPAP.  Patient is a 72 y.o. male presenting with shortness of breath. The history is provided by medical records and the EMS personnel.  Shortness of Breath Associated symptoms: no chest pain and no cough     Past Medical History  Diagnosis Date  . Hypertension   . CVA (cerebral infarction)   . Epilepsy   . Peripheral vascular disease   . Ulcer     left foot  . Cellulitis   . Chronic kidney disease   . Dyslipidemia   . Chronic venous insufficiency   . Sleep apnea   . Morbid obesity   . Diverticulosis   . Benign prostatic hypertrophy   . Glaucoma   . Staphylococcus aureus infection   . Gout   . Depression   . Insomnia   . Coronary artery disease   . CAD (coronary artery disease), native coronary artery   . GERD (gastroesophageal reflux disease)   . Heart murmur   . Myocardial infarction ~ 11/2011    "in ICU @ [facility] stress-related"  . Angina   . Pneumonia 12/11/11  . Shortness of breath 12/11/11    "mostly w/exertion"  . Type II diabetes mellitus   . Chronic daily headache     "~ all the time"  . Seizures     hx; "don't know from what"  . Arthritis   . CHF (congestive heart failure) May 2013  . Stroke ~ 1983    "little weaker right hand"  . Recent heart attack 12/09/11    Stress heart attack  . MRSA (methicillin resistant staph aureus) culture positive Dec. 17, 2013     right heel infection   Past Surgical History  Procedure Laterality Date  .  Appendectomy    . Lumbar laminectomy    . Mastoid debridement    . Knee and ankle surgery  1979    "caved in in a ditch; messed up legs"; right  . Cholecystectomy    . Back surgery      "5 or 6"  . Peripheral arterial stent graft      "don't remember which side"  . Coronary artery bypass graft  1983; 1992    "CABG X 5; CABG X 8"  . Lower extremity angiogram  01/21/2012    Procedure: LOWER EXTREMITY ANGIOGRAM;  Surgeon: Nada Libman, MD;  Location: [facility] OR;  Service: Vascular;  Laterality: Bilateral;  WITH POSSIBLE INTERVENTION  . Eye surgery      "laser; both eyes"  . Eye surgery  05/08/2012    Cataract Left eye  . Heel bx  Dec. 17, 2013    Right Heel Bx.     Family History  Problem Relation Age of Onset  . Hypertension Other   . Cancer Other   . Cancer Sister   . Deep vein thrombosis Brother   . Heart disease Brother   . Deep vein thrombosis Brother   . Heart disease Brother   .  Cancer Sister    History  Substance Use Topics  . Smoking status: Former Smoker -- 1.00 packs/day for 20 years    Types: Cigarettes    Quit date: 07/29/1991  . Smokeless tobacco: Former Neurosurgeon    Types: Chew    Quit date: 08/06/1987  . Alcohol Use: No     Comment: occasionallly    Review of Systems  Unable to perform ROS: Severe respiratory distress  Respiratory: Positive for shortness of breath. Negative for cough.   Cardiovascular: Negative for chest pain.    Allergies  Contrast media  Home Medications   Current Outpatient Rx  Name  Route  Sig  Dispense  Refill  . allopurinol (ZYLOPRIM) 300 MG tablet   Oral   Take 300 mg by mouth every morning.          Marland Kitchen ALPRAZolam (XANAX) 1 MG tablet   Oral   Take 0.5-2 mg by mouth 3 (three) times daily. Takes half tablet in the morning and at 430 pm and 2 tablets at bedtime.         Marland Kitchen atenolol (TENORMIN) 50 MG tablet   Oral   Take 50 mg by mouth 2 (two) times daily.          . brimonidine-timolol (COMBIGAN) 0.2-0.5 % ophthalmic  solution   Both Eyes   Place 1 drop into both eyes at bedtime.          . cephALEXin (KEFLEX) 500 MG capsule   Oral   Take 1 capsule (500 mg total) by mouth 4 (four) times daily.   28 capsule   0   . clopidogrel (PLAVIX) 75 MG tablet   Oral   Take 75 mg by mouth every morning.          . diclofenac sodium (VOLTAREN) 1 % GEL   Topical   Apply 1 application topically 4 (four) times daily as needed. Applies to both knees as needed for pain         . furosemide (LASIX) 80 MG tablet   Oral   Take 160 mg by mouth every morning.          Marland Kitchen glipiZIDE (GLUCOTROL) 5 MG tablet   Oral   Take 5 mg by mouth 2 (two) times daily before a meal.           . HYDROcodone-acetaminophen (VICODIN ES) 7.5-750 MG per tablet   Oral   Take 1 tablet by mouth every 6 (six) hours as needed. For pain         . insulin glargine (LANTUS) 100 UNIT/ML injection   Subcutaneous   Inject 5-10 Units into the skin 2 (two) times daily as needed. Per sliding scale in divided doses. May use up to 140 units/day. Usual dose is 5-10 units         . isosorbide mononitrate (IMDUR) 30 MG 24 hr tablet   Oral   Take 30 mg by mouth every morning.          . latanoprost (XALATAN) 0.005 % ophthalmic solution   Both Eyes   Place 1 drop into both eyes at bedtime.           . nitroGLYCERIN (NITROSTAT) 0.4 MG SL tablet   Sublingual   Place 0.4 mg under the tongue every 5 (five) minutes as needed. For chest pain         . omeprazole (PRILOSEC) 20 MG capsule   Oral   Take 20 mg by mouth  2 (two) times daily.          . potassium chloride SA (K-DUR,KLOR-CON) 20 MEQ tablet   Oral   Take 60 mEq by mouth 3 (three) times daily.          . simvastatin (ZOCOR) 20 MG tablet   Oral   Take 20 mg by mouth every evening.          . temazepam (RESTORIL) 30 MG capsule   Oral   Take 30 mg by mouth at bedtime.           BP 168/80  Pulse 80  Temp(Src) 99.6 F (37.6 C) (Axillary)  Resp 19  SpO2  99% Physical Exam  Nursing note and vitals reviewed. Constitutional: He is oriented to person, place, and time. He appears well-developed.  HENT:  Head: Normocephalic and atraumatic.  Eyes: Conjunctivae and EOM are normal. Pupils are equal, round, and reactive to light.  Neck: Normal range of motion. Neck supple.  Cardiovascular: Normal rate and regular rhythm.   Pulmonary/Chest: Breath sounds normal. He is in respiratory distress. He has no wheezes.  Poor air entry bilaterally  Abdominal: Soft. Bowel sounds are normal. He exhibits no distension. There is no tenderness. There is no rebound and no guarding.  Musculoskeletal: He exhibits edema.  2+ pitting edema with erythema  Neurological: He is alert and oriented to person, place, and time.  Skin: Skin is warm.    ED Course  Procedures (including critical care time) Labs Review Labs Reviewed  POCT I-STAT 3, BLOOD GAS (G3+) - Abnormal; Notable for the following:    pH, Arterial 7.191 (*)    pCO2 arterial 62.2 (*)    pO2, Arterial 288.0 (*)    Acid-base deficit 5.0 (*)    All other components within normal limits  CULTURE, BLOOD (ROUTINE X 2)  CULTURE, BLOOD (ROUTINE X 2)  BLOOD GAS, ARTERIAL  CBC WITH DIFFERENTIAL  COMPREHENSIVE METABOLIC PANEL  TROPONIN I  PRO B NATRIURETIC PEPTIDE  BLOOD GAS, ARTERIAL  CG4 I-STAT (LACTIC ACID)   Imaging Review No results found.  EKG Interpretation     Ventricular Rate:  80 PR Interval:  194 QRS Duration: 109 QT Interval:  394 QTC Calculation: 454 R Axis:   -37 Text Interpretation:  Age not entered, assumed to be  72 years old for purpose of ECG interpretation Sinus rhythm Left axis deviation Repol abnrm suggests ischemia, lateral leads Baseline wander in lead(s) III            MDM  No diagnosis found.   Differential diagnosis includes: ACS syndrome CHF exacerbation COPD exacerbation Valvular disorder Myocarditis Pericarditis Pericardial  effusion Pneumonia Pleural effusion Pulmonary edema PE Anemia    Pt comes in with cc of dib. Pt has hx of advanced CHF. Exam shows poor air entry, JVD and pitting edema. Pt somnolent - and ABG shows hypercapnic acidosis-  Acute - but patient denies COPD, and this appears likely to be from OSA, or poor work of breathing, Will get ABG again, and reassess.  BP is holding up in the 160s SBP.  Also has Left leg unilateral swelling with erythema Will treat as cellulitis for now. Has MRSA in the past. And we will get US duplex.    CRITICAL CARE Performed by: Derwood Kaplan   Total critical care time: 75 minutes  Critical care time was exclusive of separately billable procedures and treating other patients.  Critical care was necessary to treat or prevent imminent or life-threatening  deterioration.  Critical care was time spent personally by me on the following activities: development of treatment plan with patient and/or surrogate as well as nursing, discussions with consultants, evaluation of patient's response to treatment, examination of patient, obtaining history from patient or surrogate, ordering and performing treatments and interventions, ordering and review of laboratory studies, ordering and review of radiographic studies, pulse oximetry and re-evaluation of patient's condition.  7:59 AM Spoke with CCM. They recommend lasix for now and given the improvement in the pulmonary status think he can be admitted to step down.   Derwood Kaplan, MD 05/30/13 [id number]  Derwood Kaplan, MD 05/30/13 1191  8:24 AM Dr. Thedore Mins to treat hyperK. Likely from supplement he takes. Spoke with Dr. Kirtland Bouchard, Cardiology - he will see the paitent. Wants ACS protocol started.  Derwood Kaplan, MD 05/30/13 [phone]

## 2013-05-30 NOTE — H&P (Signed)
[facility]                                                                                    Patient Demographics  Nicholas [name], is a 72 y.o. male  MRN: [id number]   DOB - Jun 09, 1941  Admit Date - 05/30/2013  Outpatient Primary MD for the patient is Pearla Dubonnet, MD   With History of -  Past Medical History  Diagnosis Date  . Hypertension   . CVA (cerebral infarction)   . Epilepsy   . Peripheral vascular disease   . Ulcer     left foot  . Cellulitis   . Chronic kidney disease   . Dyslipidemia   . Chronic venous insufficiency   . Sleep apnea   . Morbid obesity   . Diverticulosis   . Benign prostatic hypertrophy   . Glaucoma   . Staphylococcus aureus infection   . Gout   . Depression   . Insomnia   . Coronary artery disease   . CAD (coronary artery disease), native coronary artery   . GERD (gastroesophageal reflux disease)   . Heart murmur   . Myocardial infarction ~ 11/2011    "in ICU @ [facility]; stress-related"  . Angina   . Pneumonia 12/11/11  . Shortness of breath 12/11/11    "mostly w/exertion"  . Type II diabetes mellitus   . Chronic daily headache     "~ all the time"  . Seizures     hx; "don't know from what"  . Arthritis   . CHF (congestive heart failure) May 2013  . Stroke ~ 1983    "little weaker right hand"  . Recent heart attack 12/09/11    Stress heart attack  . MRSA (methicillin resistant staph aureus) culture positive Dec. 17, 2013     right heel infection      Past Surgical History  Procedure Laterality Date  . Appendectomy    . Lumbar laminectomy    . Mastoid debridement    . Knee and ankle surgery  1979    "caved in in a ditch; messed up legs"; right  . Cholecystectomy    . Back surgery      "5 or 6"  . Peripheral arterial stent graft      "don't remember which side"  . Coronary artery bypass graft  1983; 1992    "CABG X 5; CABG X 8"  . Lower extremity angiogram  01/21/2012    Procedure: LOWER EXTREMITY ANGIOGRAM;   Surgeon: Nada Libman, MD;  Location: [facility] OR;  Service: Vascular;  Laterality: Bilateral;  WITH POSSIBLE INTERVENTION  . Eye surgery      "laser; both eyes"  . Eye surgery  05/08/2012    Cataract Left eye  . Heel bx  Dec. 17, 2013    Right Heel Bx.      in for   Chief Complaint  Patient presents with  . Shortness of Breath     HPI  Nicholas [name]  is a 72 y.o. 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 No recent travel. He has noticed worsening swelling in his legs which are quite swollen at baseline, came to the ER with somewhat decreased mental status, workup was consistent with acute on chronic respiratory failure secondary to acute on chronic combined systolic and diastolic CHF, metabolic acidosis due to hypercapnia, hyperkalemia, NSTEMI, loculated pleural effusion on the right side, case was discussed with pulmonologist Dr. Craige Cotta who recommended hospitalist admission.    Review of Systems    In addition to the HPI above,  No Fever-chills, No Headache, No changes with Vision or hearing, No problems swallowing food or Liquids, No Chest pain, Cough , ++ Shortness of Breath and orthopnea No Abdominal pain, No Nausea or Vommitting, Bowel movements are regular, No Blood in stool or Urine, No dysuria, No new skin rashes or bruises, No new joints pains-aches,  No new  weakness, tingling, numbness in any extremity, No recent weight gain or loss, No polyuria, polydypsia or polyphagia, No significant Mental Stressors.  A full 10 point Review of Systems was done, except as stated above, all other Review of Systems were negative.   Social History History  Substance Use Topics  . Smoking status: Former Smoker -- 1.00 packs/day for 20 years    Types: Cigarettes    Quit date: 07/29/1991  . Smokeless tobacco: Former Neurosurgeon    Types: Chew    Quit date: 08/06/1987  . Alcohol Use: No     Comment: occasionallly      Family History Family History  Problem Relation Age of Onset  . Hypertension Other   . Cancer Other   . Cancer Sister   . Deep vein thrombosis Brother   . Heart disease Brother   . Deep vein thrombosis Brother   . Heart disease Brother   . Cancer Sister      Prior to Admission medications   Medication Sig Start Date End Date Taking? Authorizing Provider  allopurinol (ZYLOPRIM) 300 MG tablet Take 300 mg by mouth every morning.     Historical Provider, MD  ALPRAZolam Prudy Feeler) 1 MG tablet Take 0.5-2 mg by mouth 3 (three) times daily. Takes half tablet in the morning and at 430 pm and 2 tablets at bedtime.    Historical Provider, MD  atenolol (TENORMIN) 50 MG tablet Take 50 mg by mouth 2 (two) times daily.     Historical Provider, MD  brimonidine-timolol (COMBIGAN) 0.2-0.5 % ophthalmic solution Place 1 drop into both eyes at bedtime.     Historical Provider, MD  cephALEXin (KEFLEX) 500 MG capsule Take 1 capsule (500 mg total) by mouth 4 (four) times daily. 04/30/13   Jimmye Norman, NP  clopidogrel (PLAVIX) 75 MG tablet Take 75 mg by mouth every morning.     Historical Provider, MD  diclofenac sodium (VOLTAREN) 1 % GEL Apply 1 application topically 4 (four) times daily as needed. Applies to both knees as needed for pain    Historical Provider, MD  furosemide (LASIX) 80 MG tablet Take 160 mg by mouth  every morning.     Historical Provider, MD   glipiZIDE (GLUCOTROL) 5 MG tablet Take 5 mg by mouth 2 (two) times daily before a meal.      Historical Provider, MD  HYDROcodone-acetaminophen (VICODIN ES) 7.5-750 MG per tablet Take 1 tablet by mouth every 6 (six) hours as needed. For pain    Historical Provider, MD  insulin glargine (LANTUS) 100 UNIT/ML injection Inject 5-10 Units into the skin 2 (two) times daily as needed. Per sliding scale in divided doses. May use up to 140 units/day. Usual dose is 5-10 units    Historical Provider, MD  isosorbide mononitrate (IMDUR) 30 MG 24 hr tablet Take 30 mg by mouth every morning.     Historical Provider, MD  latanoprost (XALATAN) 0.005 % ophthalmic solution Place 1 drop into both eyes at bedtime.      Historical Provider, MD  nitroGLYCERIN (NITROSTAT) 0.4 MG SL tablet Place 0.4 mg under the tongue every 5 (five) minutes as needed. For chest pain    Historical Provider, MD  omeprazole (PRILOSEC) 20 MG capsule Take 20 mg by mouth 2 (two) times daily.     Historical Provider, MD  potassium chloride SA (K-DUR,KLOR-CON) 20 MEQ tablet Take 60 mEq by mouth 3 (three) times daily.     Historical Provider, MD  simvastatin (ZOCOR) 20 MG tablet Take 20 mg by mouth every evening.     Historical Provider, MD  temazepam (RESTORIL) 30 MG capsule Take 30 mg by mouth at bedtime.     Historical Provider, MD    Allergies  Allergen Reactions  . Contrast Media [Iodinated Diagnostic Agents] Other (See Comments)    Shaking and feeling terrible    Physical Exam  Vitals  Blood pressure 139/55, pulse 65, temperature 99.6 F (37.6 C), temperature source Axillary, resp. rate 24, SpO2 99.00%.   1. General a morbidly obese middle-aged African American gentleman lying in bed in moderate respiratory distress wearing BiPAP  2. Normal affect and insight, Not Suicidal or Homicidal, Awake Alert, Oriented X 3.  3. No F.N deficits, ALL C.Nerves Intact, Strength 5/5 all 4 extremities, Sensation intact all 4 extremities,  Plantars down going.  4. Ears and Eyes appear Normal, Conjunctivae clear, PERRLA. Moist Oral Mucosa.  5. Supple Neck, elevated JVD, No cervical lymphadenopathy appriciated, No Carotid Bruits.  6. Symmetrical Chest wall movement, Good air movement bilaterally, bibasilar rales  7. RRR, No Gallops, Rubs or Murmurs, No Parasternal Heave.  8. Positive Bowel Sounds, Abdomen Soft, Non tender, No organomegaly appriciated,No rebound -guarding or rigidity.  9.  No Cyanosis, Normal Skin Turgor, No Skin Rash or Bruise. 3+ bipedal edema left more than right, left lower extremity is red up to the knee  10. Good muscle tone,  joints appear normal , no effusions, Normal ROM.  11. No Palpable Lymph Nodes in Neck or Axillae    Data Review  CBC  Recent Labs Lab 05/30/13 0623  WBC 13.2*  HGB 12.5*  HCT 39.5  PLT 347  MCV 93.6  MCH 29.6  MCHC 31.6  RDW 15.8*  LYMPHSABS 3.1  MONOABS 0.7  EOSABS 0.4  BASOSABS 0.0   ------------------------------------------------------------------------------------------------------------------  Chemistries   Recent Labs Lab 05/30/13 0623  NA 135  K 7.1*  CL 104  CO2 21  GLUCOSE 198*  BUN 18  CREATININE 1.35  CALCIUM 8.9  AST 19  ALT 17  ALKPHOS 117  BILITOT 0.2*   ------------------------------------------------------------------------------------------------------------------ CrCl is unknown because both a height and weight (above  a minimum accepted value) are required for this calculation. ------------------------------------------------------------------------------------------------------------------ No results found for this basename: TSH, T4TOTAL, FREET3, T3FREE, THYROIDAB,  in the last 72 hours   Coagulation profile No results found for this basename: INR, PROTIME,  in the last 168 hours ------------------------------------------------------------------------------------------------------------------- No results found for this  basename: DDIMER,  in the last 72 hours -------------------------------------------------------------------------------------------------------------------  Cardiac Enzymes  Recent Labs Lab 05/30/13 0620  TROPONINI 1.35*   ------------------------------------------------------------------------------------------------------------------ No components found with this basename: POCBNP,    ---------------------------------------------------------------------------------------------------------------  Urinalysis    Component Value Date/Time   COLORURINE RED* 04/30/2013 2022   APPEARANCEUR TURBID* 04/30/2013 2022   LABSPEC 1.032* 04/30/2013 2022   PHURINE 5.0 04/30/2013 2022   GLUCOSEU NEGATIVE 04/30/2013 2022   HGBUR LARGE* 04/30/2013 2022   BILIRUBINUR LARGE* 04/30/2013 2022   KETONESUR >80* 04/30/2013 2022   PROTEINUR >300* 04/30/2013 2022   UROBILINOGEN 1.0 04/30/2013 2022   NITRITE POSITIVE* 04/30/2013 2022   LEUKOCYTESUR LARGE* 04/30/2013 2022    ----------------------------------------------------------------------------------------------------------------  Imaging results:   Dg Chest Portable 1 View  05/30/2013   CLINICAL DATA:  Shortness of Breath  EXAM: PORTABLE CHEST - 1 VIEW  COMPARISON:  Dec 11, 2011  FINDINGS: There is loculated effusion on the right. There is cardiomegaly with mild interstitial edema. There is underlying emphysema. There is no airspace consolidation. No adenopathy. Patient is status post coronary artery bypass grafting.  IMPRESSION: The congestive heart failure superimposed on emphysema. Note that there is loculated effusion on the right.   Electronically Signed   By: Bretta Bang M.D.   On: 05/30/2013 07:04    My personal review of EKG: Rhythm NSR,  no Acute ST changes    Assessment & Plan    1. Acute on chronic respiratory failure secondary to acute on chronic combined systolic and diastolic CHF causing hypoxia and hypercapnia - will be admitted  to step down unit, IV Lasix, BiPAP, monitor ABG and clinical status closely, fluid and salt restriction, nitro paste, beta blocker, elevate head of the bed. Cardiology has been consulted. Have requested pulmonary closely evaluate the patient in the light of respiratory failure and possible right sided locular effusion.   2. Hyperkalemia. Hypokalemia protocol initiated, patient received IV calcium gluconate, amp of D50 followed by IV insulin, Kayexalate, nebulizer treatment, bicarbonate is stable. We'll monitor potassium closely. Telemetry monitor.   3. Massive swelling in lower extremities left extremity more than right this is acute on chronic. Some changes consistent with cellulitis on the left leg with mild leukocytosis, will place on vancomycin for possible cellulitis in the left leg, diuresis for edema, obtain venous duplex rule out DVT.   4.NSTEMI - patient given aspirin, beta blocker, statin, nitro paste, heparin drip initiated. Is on plavix which will be continued, No acute changes on EKG. This could be secondary to hypoxia and respiratory failure due to #1 above, cardiology consulted. Patient is chest pain-free.   5. Diabetes mellitus type 2. We'll check A1c, home dose Glucotrol will be held, sliding scale insulin for now.    6. Dyslipidemia continue home dose statin.    7. Obstructive sleep apnea and nighttime CPAP will be continued.   DVT Prophylaxis Heparin    AM Labs Ordered, also please review Full Orders  Family Communication: Admission, patients condition and plan of care including tests being ordered have been discussed with the patient who indicates understanding and agree with the plan and Code Status.  Code Status DNR  Likely DC to TBD  Condition GUARDED++  Time spent in minutes :40    SINGH,PRASHANT K M.D on 05/30/2013 at 8:38 AM  Between 7am to 7pm - Pager - [phone]  After 7pm go to [email or web address] - password [email or web address]  And look for the night coverage  person covering me after hours  [facility] Office  [phone]

## 2013-05-30 NOTE — ED Notes (Signed)
Pt's glucose 198 per labs at 0800.

## 2013-05-30 NOTE — Progress Notes (Signed)
ANTIBIOTIC CONSULT NOTE - INITIAL  Pharmacy Consult for vancomycin  Indication: cellulitis  Allergies  Allergen Reactions  . Contrast Media [Iodinated Diagnostic Agents] Other (See Comments)    Shaking and feeling terrible    Patient Measurements: Height: 5\' 8"  (172.7 cm) Weight: 293 lb 8 oz (133.131 kg) IBW/kg (Calculated) : 68.4  Vital Signs: Temp: 97.4 F (36.3 C) (10/26 1251) Temp src: Axillary (10/26 1251) BP: 143/61 mmHg (10/26 1251) Pulse Rate: 61 (10/26 1221) Intake/Output from previous day:   Intake/Output from this shift:    Labs:  Recent Labs  05/30/13 0623  WBC 13.2*  HGB 12.5*  PLT 347  CREATININE 1.35   Estimated Creatinine Clearance: 66.9 ml/min (by C-G formula based on Cr of 1.35). No results found for this basename: VANCOTROUGH, VANCOPEAK, VANCORANDOM, GENTTROUGH, GENTPEAK, GENTRANDOM, TOBRATROUGH, TOBRAPEAK, TOBRARND, AMIKACINPEAK, AMIKACINTROU, AMIKACIN,  in the last 72 hours   Microbiology: No results found for this or any previous visit (from the past 720 hour(s)).  Medical History: Past Medical History  Diagnosis Date  . Hypertension   . Epilepsy   . Peripheral vascular disease   . Ulcer     left foot  . CKD (chronic kidney disease), stage III   . Dyslipidemia   . Chronic venous insufficiency   . Sleep apnea   . Morbid obesity   . Diverticulosis   . Benign prostatic hypertrophy   . Glaucoma   . Staphylococcus aureus infection   . Gout   . Depression   . Insomnia   . CAD (coronary artery disease), native coronary artery   . GERD (gastroesophageal reflux disease)   . Heart murmur   . Myocardial infarction ~ 11/2011    "in ICU @ [facility]; stress-related"  . Angina   . Pneumonia 12/11/11  . Shortness of breath 12/11/11    "mostly w/exertion"  . Type II diabetes mellitus   . Chronic daily headache     "~ all the time"  . Seizures     hx; "don't know from what"  . Arthritis   . Chronic combined systolic and diastolic CHF  (congestive heart failure)     a. Echo 12/2011: EF 30%, grade 1 dd, mild-mod biatrial enlargement  . Stroke ~ 1983    "little weaker right hand"  . Recent heart attack 12/09/11    Stress heart attack  . MRSA (methicillin resistant staph aureus) culture positive Dec. 17, 2013     right heel infection  . Ischemic cardiomyopathy     Medications:  Anti-infectives   Start     Dose/Rate Route Frequency Ordered Stop   05/30/13 0800  vancomycin (VANCOCIN) IVPB 1000 mg/200 mL premix     1,000 mg 200 mL/hr over 60 Minutes Intravenous  Once 05/30/13 0747 05/30/13 1152     Assessment: 72 year old male admitted with shortness of breath.  He has swelling and possible cellulitis of his LLE and he is to continue Vancomycin therapy. Note he has chronic kidney disease, and his normalized CrCl is ~45-50 ml/min.   Goal of Therapy:  Vancomycin trough level 10-15 mcg/ml  Plan:  Give an additional 1500mg  of Vancomycin for a total of 2500mg  loading dose Vancomycin 1750mg  IV q24h starting 10/27 Monitor renal function closely Check trough at steady state  Estella Husk, Pharm.D., BCPS, AAHIVP Clinical Pharmacist Phone: [phone] or [phone] 05/30/2013, 2:00 PM

## 2013-05-30 NOTE — Consult Note (Signed)
PULMONARY  / CRITICAL CARE MEDICINE  Name: Nicholas [name] MRN: [id number] DOB: 1941/02/02    ADMISSION DATE:  05/30/2013 CONSULTATION DATE:  10/26  REFERRING MD :  Dr. Thedore Mins PRIMARY SERVICE:  TRH  CHIEF COMPLAINT:  Pleural Effusion   BRIEF PATIENT DESCRIPTION: 72 y/o M with a complex medical history admitted with progressive SOB, LE swelling with presumed CHF exacerbation.  Found to have R pleural effusion & PCCM consulted for evaluation.   SIGNIFICANT EVENTS / STUDIES:  10/26 - admit with CHF exacerbation, acute resp fx, volume overload, NSTEMI  LINES / TUBES:   CULTURES: BCx2 10/26>>>  ANTIBIOTICS: Vanco (Skin/LE)10/26>>>  HISTORY OF PRESENT ILLNESS:  71 y/o M with a complex medical history to include CVA, HTN, Epilepsy, PVD, LE Ulcerations / Cellulitis (hx MRSA), OSA, DM, CAD s/p MI & CABG, Ischemic Cardiomyopathy who presented to [facility] ER via EMS with a 2 day history of worsening shortness of breath - worse with exertion and lying flat, and lower extremity swelling.  Family reports patient stopped taking lasix about a week prior to admit.  They note significant increase in LE's.  ER evaluation demonstrated drowsiness, acute hypercarbic respiratory failure and pt was placed on BiPAP, CXR concerning for effusion and resp acidosis.      Patient denies fevers, chills, nausea, vomiting, diarrhea, chest pain.  Reports pain / swelling in LLE.  Reports feeling like he has gained weight but is unable to put a number on how much.      PAST MEDICAL HISTORY :  Past Medical History  Diagnosis Date  . Hypertension   . CVA (cerebral infarction)   . Epilepsy   . Peripheral vascular disease   . Ulcer     left foot  . Cellulitis   . Chronic kidney disease   . Dyslipidemia   . Chronic venous insufficiency   . Sleep apnea   . Morbid obesity   . Diverticulosis   . Benign prostatic hypertrophy   . Glaucoma   . Staphylococcus aureus infection   . Gout   . Depression   . Insomnia    . Coronary artery disease   . CAD (coronary artery disease), native coronary artery   . GERD (gastroesophageal reflux disease)   . Heart murmur   . Myocardial infarction ~ 11/2011    "in ICU @ [facility]; stress-related"  . Angina   . Pneumonia 12/11/11  . Shortness of breath 12/11/11    "mostly w/exertion"  . Type II diabetes mellitus   . Chronic daily headache     "~ all the time"  . Seizures     hx; "don't know from what"  . Arthritis   . CHF (congestive heart failure) May 2013  . Stroke ~ 1983    "little weaker right hand"  . Recent heart attack 12/09/11    Stress heart attack  . MRSA (methicillin resistant staph aureus) culture positive Dec. 17, 2013     right heel infection   Past Surgical History  Procedure Laterality Date  . Appendectomy    . Lumbar laminectomy    . Mastoid debridement    . Knee and ankle surgery  1979    "caved in in a ditch; messed up legs"; right  . Cholecystectomy    . Back surgery      "5 or 6"  . Peripheral arterial stent graft      "don't remember which side"  . Coronary artery bypass graft  1983; 1992    "  CABG X 5; CABG X 8"  . Lower extremity angiogram  01/21/2012    Procedure: LOWER EXTREMITY ANGIOGRAM;  Surgeon: Nada Libman, MD;  Location: [facility] OR;  Service: Vascular;  Laterality: Bilateral;  WITH POSSIBLE INTERVENTION  . Eye surgery      "laser; both eyes"  . Eye surgery  05/08/2012    Cataract Left eye  . Heel bx  Dec. 17, 2013    Right Heel Bx.     Prior to Admission medications   Medication Sig Start Date End Date Taking? Authorizing Provider  allopurinol (ZYLOPRIM) 300 MG tablet Take 300 mg by mouth every morning.     Historical Provider, MD  ALPRAZolam Prudy Feeler) 1 MG tablet Take 0.5-2 mg by mouth 3 (three) times daily. Takes half tablet in the morning and at 430 pm and 2 tablets at bedtime.    Historical Provider, MD  atenolol (TENORMIN) 50 MG tablet Take 50 mg by mouth 2 (two) times daily.     Historical Provider, MD   brimonidine-timolol (COMBIGAN) 0.2-0.5 % ophthalmic solution Place 1 drop into both eyes at bedtime.     Historical Provider, MD  cephALEXin (KEFLEX) 500 MG capsule Take 1 capsule (500 mg total) by mouth 4 (four) times daily. 04/30/13   Jimmye Norman, NP  clopidogrel (PLAVIX) 75 MG tablet Take 75 mg by mouth every morning.     Historical Provider, MD  diclofenac sodium (VOLTAREN) 1 % GEL Apply 1 application topically 4 (four) times daily as needed. Applies to both knees as needed for pain    Historical Provider, MD  furosemide (LASIX) 80 MG tablet Take 160 mg by mouth every morning.     Historical Provider, MD  glipiZIDE (GLUCOTROL) 5 MG tablet Take 5 mg by mouth 2 (two) times daily before a meal.      Historical Provider, MD  HYDROcodone-acetaminophen (VICODIN ES) 7.5-750 MG per tablet Take 1 tablet by mouth every 6 (six) hours as needed. For pain    Historical Provider, MD  insulin glargine (LANTUS) 100 UNIT/ML injection Inject 5-10 Units into the skin 2 (two) times daily as needed. Per sliding scale in divided doses. May use up to 140 units/day. Usual dose is 5-10 units    Historical Provider, MD  isosorbide mononitrate (IMDUR) 30 MG 24 hr tablet Take 30 mg by mouth every morning.     Historical Provider, MD  latanoprost (XALATAN) 0.005 % ophthalmic solution Place 1 drop into both eyes at bedtime.      Historical Provider, MD  nitroGLYCERIN (NITROSTAT) 0.4 MG SL tablet Place 0.4 mg under the tongue every 5 (five) minutes as needed. For chest pain    Historical Provider, MD  omeprazole (PRILOSEC) 20 MG capsule Take 20 mg by mouth 2 (two) times daily.     Historical Provider, MD  potassium chloride SA (K-DUR,KLOR-CON) 20 MEQ tablet Take 60 mEq by mouth 3 (three) times daily.     Historical Provider, MD  simvastatin (ZOCOR) 20 MG tablet Take 20 mg by mouth every evening.     Historical Provider, MD  temazepam (RESTORIL) 30 MG capsule Take 30 mg by mouth at bedtime.     Historical Provider, MD    Allergies  Allergen Reactions  . Contrast Media [Iodinated Diagnostic Agents] Other (See Comments)    Shaking and feeling terrible    FAMILY HISTORY:  Family History  Problem Relation Age of Onset  . Hypertension Other   . Cancer Other   . Cancer  Sister   . Deep vein thrombosis Brother   . Heart disease Brother   . Deep vein thrombosis Brother   . Heart disease Brother   . Cancer Sister    SOCIAL HISTORY:  reports that he quit smoking about 21 years ago. His smoking use included Cigarettes. He has a 20 pack-year smoking history. He quit smokeless tobacco use about 25 years ago. His smokeless tobacco use included Chew. He reports that he does not drink alcohol or use illicit drugs.  REVIEW OF SYSTEMS:  See HPI for pertinent positives.  All systems reviewed and otherwise negative.   SUBJECTIVE:   VITAL SIGNS: Temp:  [99.6 F (37.6 C)] 99.6 F (37.6 C) (10/26 1610) Pulse Rate:  [65-80] 65 (10/26 0645) Resp:  [19-24] 24 (10/26 0645) BP: (130-168)/(54-80) 139/55 mmHg (10/26 0645) SpO2:  [96 %-100 %] 99 % (10/26 0645) FiO2 (%):  [35 %-40 %] 35 % (10/26 0741)  PHYSICAL EXAMINATION: General:  Morbidly obese male in NAD on BiPAP Neuro:  Drowsy but arouses, answers questions appropriately, MAE HEENT:  Mm pink/moist, short / thick neck Cardiovascular:  s1s2 rrr, no m/r/g Lungs:  resp's even/non-labored, lungs bilaterally diminished, fine crackles posterior lower Abdomen:  Obese, soft, bsx4 active Musculoskeletal:  No acute deformities Skin:  Warm/dry, 4+ pitting edema in BLE, LLE with erythema / warmth concerning for cellulitis   Recent Labs Lab 05/30/13 0623  NA 135  K 7.1*  CL 104  CO2 21  BUN 18  CREATININE 1.35  GLUCOSE 198*    Recent Labs Lab 05/30/13 0623  HGB 12.5*  HCT 39.5  WBC 13.2*  PLT 347   Dg Chest Portable 1 View  05/30/2013   CLINICAL DATA:  Shortness of Breath  EXAM: PORTABLE CHEST - 1 VIEW  COMPARISON:  Dec 11, 2011  FINDINGS: There is  loculated effusion on the right. There is cardiomegaly with mild interstitial edema. There is underlying emphysema. There is no airspace consolidation. No adenopathy. Patient is status post coronary artery bypass grafting.  IMPRESSION: The congestive heart failure superimposed on emphysema. Note that there is loculated effusion on the right.   Electronically Signed   By: Bretta Bang M.D.   On: 05/30/2013 07:04    ASSESSMENT / PLAN:  Right Pleural Effusion - small RLL effusion, likely component of breast shadow noted on CXR >> minimal on bedside u/s 10/26 Acute Hypercarbic Respiratory Failure Former Tobacco Abuse  PLAN: -bipap support now & mandatory QHS -wean oxygen for sats 90-95% -aggressive diuresis as renal fxn & BP permit -upright positioning / pulmonary hygiene -f/u ABG PRN -BD's in setting of smoking hx -no need for further steroids   NSTEMI Decompensated CHF HTN CAD  PLAN: -control BP -lasix 80 IV BID -heparin gtt -Cardiology following   LLE Cellulitis - DVT negative.   PLAN: -vancomycin per primary  Hyperkalemia   PLAN: -kayexalate / lasix per primary -f/u BMP     Canary Brim, NP-C [facility] Pgr: [phone] or [phone]    05/30/2013, 10:45 AM  Reviewed above, examined pt, and agree with assessment/plan.  72 yo male admitted with acute respiratory failure from CHF exacerbation with pulmonary edema and NSTEMI.  He has improved with medical therapy and NIPPV.  His CXR shows ? Of loculated Rt pleural effusion >> this may actually represent skin fold overlaying lung field giving impression of effusion.  Bedside u/s performed 10/26 showed minimal Rt pleural fluid that was free flowing.  Recommend continuing current therapies.  Transition off NIPPV when respiratory status improved.  F/u CXR 10/27.  Coralyn Helling, MD [facility] Pulmonary/Critical Care 05/30/2013, 2:01 PM Pager:  [phone] After 3pm call: [phone]

## 2013-05-30 NOTE — ED Notes (Signed)
Report given to Denny Peon, RN unit 2C.

## 2013-05-30 NOTE — Progress Notes (Signed)
Bilateral lower extremity venous duplex:  No obvious evidence of DVT, superficial thrombosis, or Baker's Cyst.  Technically difficult study due to patient's body habitus.

## 2013-05-30 NOTE — Progress Notes (Signed)
ANTICOAGULATION CONSULT NOTE - Initial Consult  Pharmacy Consult for Heparin Indication: chest pain/ACS  Allergies  Allergen Reactions  . Contrast Media [Iodinated Diagnostic Agents] Other (See Comments)    Shaking and feeling terrible    Patient Measurements:   Wt Readings from Last 3 Encounters:  05/02/13 275 lb (124.739 kg)  12/21/12 270 lb (122.471 kg)  08/31/12 275 lb (124.739 kg)   Heparin Dosing Weight: 102 kg  Vital Signs: Temp: 99.6 F (37.6 C) (10/26 0608) Temp src: Axillary (10/26 0608) BP: 139/55 mmHg (10/26 0645) Pulse Rate: 65 (10/26 0645)  Labs:  Recent Labs  05/30/13 0620 05/30/13 0623  HGB  --  12.5*  HCT  --  39.5  PLT  --  347  CREATININE  --  1.35  TROPONINI 1.35*  --     The CrCl is unknown because both a height and weight (above a minimum accepted value) are required for this calculation.   Medical History: Past Medical History  Diagnosis Date  . Hypertension   . CVA (cerebral infarction)   . Epilepsy   . Peripheral vascular disease   . Ulcer     left foot  . Cellulitis   . Chronic kidney disease   . Dyslipidemia   . Chronic venous insufficiency   . Sleep apnea   . Morbid obesity   . Diverticulosis   . Benign prostatic hypertrophy   . Glaucoma   . Staphylococcus aureus infection   . Gout   . Depression   . Insomnia   . Coronary artery disease   . CAD (coronary artery disease), native coronary artery   . GERD (gastroesophageal reflux disease)   . Heart murmur   . Myocardial infarction ~ 11/2011    "in ICU @ [facility]; stress-related"  . Angina   . Pneumonia 12/11/11  . Shortness of breath 12/11/11    "mostly w/exertion"  . Type II diabetes mellitus   . Chronic daily headache     "~ all the time"  . Seizures     hx; "don't know from what"  . Arthritis   . CHF (congestive heart failure) May 2013  . Stroke ~ 1983    "little weaker right hand"  . Recent heart attack 12/09/11    Stress heart attack  . MRSA (methicillin  resistant staph aureus) culture positive Dec. 17, 2013     right heel infection    Medications:  Infusions:  . calcium gluconate    . vancomycin      Assessment: 72 year old male admitted with shortness of breath.  His troponin is elevated and he is to begin anticoagulation with Heparin.  Goal of Therapy:  Heparin level 0.3-0.7 units/ml Monitor platelets by anticoagulation protocol: Yes   Plan:  Heparin 4000 units IV bolus Start Heparin infusion at 1400 units/hr Check Heparin level in 8 hours Daily Heparin level and CBC Check baseline PT/PTT  Estella Husk, Pharm.D., BCPS, AAHIVP Clinical Pharmacist Phone: [phone] or [phone] 05/30/2013, 8:50 AM

## 2013-05-30 NOTE — Consult Note (Addendum)
Cardiology Consult Note   Patient ID: DAMONT BALLES MRN: [id number], DOB/AGE: 09/05/1940   Admit date: 05/30/2013 Date of Consult: 05/30/2013  Primary Physician: Pearla Dubonnet, MD Primary Cardiologist: Elliot Gault MD  Reason for consult: NSTEMI, CHF  HPI: Nicholas [name] is a 72 y.o. male w/ a complex past medical history and frequent admissions noted below. 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   Reviewing his Epic chart, he was last seen in the hospital 08/2011 for acute pulmonary edema 2/2 hypertensive emergency, type 2 NSTEMI and SIRS sourced from cellulitis. He has a history of CHF, but had stopped Lasix at the time reasoning that if he reduced his salt intake, he did not have to take it. BP was markedly elevated and flash pulmonary edema ensued. He was intubated, treated with antibiotics and diuresed. He was deemed not to be a good candidate for aggressive cardiac care/intervention by Dr. Donnie Aho. Poor quality of life at home was mentioned. Supportive care was elected from a cardiac standpoint. There was a recommendation to discuss quality of life measures thereafter. He was eventually weaned off the vent successfully, improved clinically and discharged home on antibiotics and Lasix 80 BID. Readmitted 10/2011 and 12/2011 for recurrent LLE cellulitis. Followed by wound care. He underwent peripheral angiography revealing 85% focal stenosis of L popliteal artery s/p atherectomy, recurred on 05/2012 angiogram s/p repeat atherectomy. Presented  to the ED last month for gross hematuria attributed to UTI. Foley catheter placed. Followed by urology.   2D echo 08/2011: EF 30%, grade 1 dd, mild-mod biatrial enlargement   Patient was unable to provide history due to lethargy on BiPAP. Wife supplemented the history. He has been experiencing chest pain on exertion over the past month, relieved by NTG SL x 1-2 tabs. At baseline, he is able to ambulate with a walker around their apartment without incident. He sleeps on 3 pillows at baseline. Over the past two days, he has experienced worsening DOE (cannot walk to the bathroom w/o becoming short of breath), increase LE edema and PND. His wife states he is on Lasix 80 daily at home, but has not taken this for 1-2 weeks since recent urology work-up for unclear reasons. He drinks 1-2 cases of bottled water per week. She denies increased salt intake. Does not weigh daily. Unclear of baseline weight. She actually reports his PO intake has been poor recently. She states he has not had an ischemic eval since 2003 CABG. He continues to follow up with Dr. Donnie Aho- most recently 1-2 months ago (per wife). She affirms that a strategy of medical management has been pursed from a cardiac standpoint. She denies recent fevers, chills. Does note LLE unilateral redness and marked swelling compared to RLE. He did have a recent episode of hematuria per wife. SOB worsened this AM thus prompting his ED presentation.   In the ED, EKG revealed new ST depressions V6, I, aVL. BMET- hyperkalemia (K 7.1), BUN 18/Cr 1.35. CBC- WBC 13.2K, Hgb 12.5, Hct 39.5, PLT/MCV WNL. ABG- pH 7.19, pCO2 62, pO2 288. TnI 1.35. pBNP 2668. Port CXR indicates CHF  superimposed on emphysema, loculated R pleural effusion. Lactic acid WNL. Bld cx's x 2 obtained. CCM/pulm consulted. Cardiology consulted for NSTEMI, CHF. Hospitalists admitting. Started on Lasix IV, BiPAP, NTG paste, salt/fluid restriction. Received Ca gluc, D50, insulin, kayex, albuterol neb,  bicarb for hyperK. Massive L>RLE edema. LE duplex indicates no evidence of DVT. Started on heparin. Continued on ASA, Plavix, BB, statin, NTG as above.   Repeat K 6.8.  VSS- BP peaked at 187/73, around 150-160/60s. SpO2 96-100% BiPAP. HR 60-70s.   Problem List: Past Medical History  Diagnosis Date  . Hypertension   . Epilepsy   . Peripheral vascular disease   . Ulcer     left foot  . CKD (chronic kidney disease), stage III   . Dyslipidemia   . Chronic venous insufficiency   . Sleep apnea   . Morbid obesity   . Diverticulosis   . Benign prostatic hypertrophy   . Glaucoma   . Staphylococcus aureus infection   . Gout   . Depression   . Insomnia   . CAD (coronary artery disease), native coronary artery   . GERD (gastroesophageal reflux disease)   . Heart murmur   . Myocardial infarction ~ 11/2011    "in ICU @ [facility]; stress-related"  . Angina   . Pneumonia 12/11/11  . Shortness of breath 12/11/11    "mostly w/exertion"  . Type II diabetes mellitus   . Chronic daily headache     "~ all the time"  . Seizures     hx; "don't know from what"  . Arthritis   . Chronic combined systolic and diastolic CHF (congestive heart failure)     a. Echo 12/2011: EF 30%, grade 1 dd, mild-mod biatrial enlargement  . Stroke ~ 1983    "little weaker right hand"  . Recent heart attack 12/09/11    Stress heart attack  . MRSA (methicillin resistant staph aureus) culture positive Dec. 17, 2013     right heel infection  . Ischemic cardiomyopathy     Past Surgical History  Procedure Laterality Date  . Appendectomy    . Lumbar laminectomy    . Mastoid debridement    . Knee and ankle surgery  1979    "caved in in a ditch; messed up legs"; right  . Cholecystectomy    . Back surgery      "5 or 6"  . Peripheral arterial stent graft      "don't remember which side"  . Coronary artery bypass graft  1983; 1992    "CABG X 5; CABG X 8"  . Lower extremity angiogram  01/21/2012    Procedure: LOWER  EXTREMITY ANGIOGRAM;  Surgeon: Nada Libman, MD;  Location: [facility] OR;  Service: Vascular;  Laterality: Bilateral;  WITH POSSIBLE INTERVENTION  . Eye surgery      "laser; both eyes"  . Eye surgery  05/08/2012    Cataract Left eye  . Heel bx  Dec. 17, 2013    Right Heel Bx.    . Transluminal atherectomy popliteal artery  01/21/2012    Left     Allergies:  Allergies  Allergen Reactions  . Contrast Media [Iodinated Diagnostic Agents] Other (See Comments)    Shaking and feeling terrible    Home Medications: Prior to Admission medications   Medication Sig Start Date End Date Taking? Authorizing Provider  allopurinol (ZYLOPRIM) 300 MG tablet Take 300 mg by mouth every morning.     Historical Provider, MD  ALPRAZolam Prudy Feeler) 1  MG tablet Take 0.5-2 mg by mouth 3 (three) times daily. Takes half tablet in the morning and at 430 pm and 2 tablets at bedtime.    Historical Provider, MD  atenolol (TENORMIN) 50 MG tablet Take 50 mg by mouth 2 (two) times daily.     Historical Provider, MD  brimonidine-timolol (COMBIGAN) 0.2-0.5 % ophthalmic solution Place 1 drop into both eyes at bedtime.     Historical Provider, MD  cephALEXin (KEFLEX) 500 MG capsule Take 1 capsule (500 mg total) by mouth 4 (four) times daily. 04/30/13   Jimmye Norman, NP  clopidogrel (PLAVIX) 75 MG tablet Take 75 mg by mouth every morning.     Historical Provider, MD  diclofenac sodium (VOLTAREN) 1 % GEL Apply 1 application topically 4 (four) times daily as needed. Applies to both knees as needed for pain    Historical Provider, MD  furosemide (LASIX) 80 MG tablet Take 160 mg by mouth every morning.     Historical Provider, MD  glipiZIDE (GLUCOTROL) 5 MG tablet Take 5 mg by mouth 2 (two) times daily before a meal.      Historical Provider, MD  HYDROcodone-acetaminophen (VICODIN ES) 7.5-750 MG per tablet Take 1 tablet by mouth every 6 (six) hours as needed. For pain    Historical Provider, MD  insulin glargine (LANTUS) 100 UNIT/ML  injection Inject 5-10 Units into the skin 2 (two) times daily as needed. Per sliding scale in divided doses. May use up to 140 units/day. Usual dose is 5-10 units    Historical Provider, MD  isosorbide mononitrate (IMDUR) 30 MG 24 hr tablet Take 30 mg by mouth every morning.     Historical Provider, MD  latanoprost (XALATAN) 0.005 % ophthalmic solution Place 1 drop into both eyes at bedtime.      Historical Provider, MD  nitroGLYCERIN (NITROSTAT) 0.4 MG SL tablet Place 0.4 mg under the tongue every 5 (five) minutes as needed. For chest pain    Historical Provider, MD  omeprazole (PRILOSEC) 20 MG capsule Take 20 mg by mouth 2 (two) times daily.     Historical Provider, MD  potassium chloride SA (K-DUR,KLOR-CON) 20 MEQ tablet Take 60 mEq by mouth 3 (three) times daily.     Historical Provider, MD  simvastatin (ZOCOR) 20 MG tablet Take 20 mg by mouth every evening.     Historical Provider, MD  temazepam (RESTORIL) 30 MG capsule Take 30 mg by mouth at bedtime.     Historical Provider, MD    Inpatient Medications:  . aspirin EC  325 mg Oral Daily  . aspirin  300 mg Rectal Once  . atorvastatin  10 mg Oral q1800  . furosemide  80 mg Intravenous BID  . metoprolol tartrate  25 mg Oral BID  . nitroGLYCERIN  1 inch Topical Q6H    (Not in a hospital admission)  Family History  Problem Relation Age of Onset  . Hypertension Other   . Cancer Other   . Cancer Sister   . Deep vein thrombosis Brother   . Heart disease Brother   . Deep vein thrombosis Brother   . Heart disease Brother   . Cancer Sister      History   Social History  . Marital Status: Married    Spouse Name: N/A    Number of Children: N/A  . Years of Education: N/A   Occupational History  . Not on file.   Social History Main Topics  . Smoking status: Former Smoker --  1.00 packs/day for 20 years    Types: Cigarettes    Quit date: 07/29/1991  . Smokeless tobacco: Former Neurosurgeon    Types: Chew    Quit date: 08/06/1987  .  Alcohol Use: No     Comment: occasionallly  . Drug Use: No  . Sexual Activity: Not Currently   Other Topics Concern  . Not on file   Social History Narrative  . No narrative on file    Review of Systems: General: negative for chills, fever, night sweats or weight changes.  Cardiovascular: positive for chest pain, dyspnea on exertion, edema, orthopnea, paroxysmal nocturnal dyspnea and shortness of breath, negative for palpitations Dermatological: negative for rash Respiratory: negative for cough or wheezing Urologic: positive for hematuria Abdominal: negative for nausea, vomiting, diarrhea, bright red blood per rectum, melena, or hematemesis Neurologic: negative for visual changes, syncope, or dizziness All other systems reviewed and are otherwise negative except as noted above.  Physical Exam: Blood pressure 139/55, pulse 65, temperature 99.6 F (37.6 C), temperature source Axillary, resp. rate 24, SpO2 99.00%.    General: Morbidly obese, lethargic male on BiPAP  Head: Normocephalic, atraumatic, sclera non-icteric, no xanthomas, nares are without discharge.  Neck: Difficult to appreciate for carotid bruits with BiPAP. JVD unappreciable due to redundant neck tissue and position.  Lungs: Reduced lung sounds diffusely, particularly at the R base. No wheezes or rhonchi. Stridor noted.  Breathing assisted by BiPAP, unlabored.  Heart: distant heart sounds, RRR with S1 S2. No murmurs, rubs, or gallops appreciated. Abdomen: Marked distention, obesity, non-tender, hypoactive bowel sounds. No hepatomegaly. No rebound/guarding. No obvious abdominal masses.  Extremities: Marked 3-4+ bilateral (L>R) pitting edema extending from feet to abdomen, LLE with erythema and calor to palpation.  Neuro/psych: Moving all extremities  Labs: Recent Labs     05/30/13  0623  WBC  13.2*  HGB  12.5*  HCT  39.5  MCV  93.6  PLT  347   Recent Labs Lab 05/30/13 0623  NA 135  K 7.1*  CL 104  CO2 21    BUN 18  CREATININE 1.35  CALCIUM 8.9  PROT 7.7  BILITOT 0.2*  ALKPHOS 117  ALT 17  AST 19  GLUCOSE 198*   Recent Labs     05/30/13  0620  TROPONINI  1.35*   Radiology/Studies: Dg Chest Portable 1 View  05/30/2013   CLINICAL DATA:  Shortness of Breath  EXAM: PORTABLE CHEST - 1 VIEW  COMPARISON:  Dec 11, 2011  FINDINGS: There is loculated effusion on the right. There is cardiomegaly with mild interstitial edema. There is underlying emphysema. There is no airspace consolidation. No adenopathy. Patient is status post coronary artery bypass grafting.  IMPRESSION: The congestive heart failure superimposed on emphysema. Note that there is loculated effusion on the right.   Electronically Signed   By: Bretta Bang M.D.   On: 05/30/2013 07:04   EKG: NSR, LAE, LAD, 80 bpm, 1 mm flat ST depression V6, I, aVL (new from 12/2011 tracing)  ASSESSMENT AND PLAN:   1. Acute on chronic respiratory failure 2. Acute on chronic combined CHF, stage III->IV 3. NSTEMI 4. CAD s/p CABG, redo CABG with ischemic cardiomyopathy 5. Medical noncompliance 6. LLE cellulitis 7. PVD 8. OSA 9. DM2 with diabetic neuropathy/nephropathy 10. Hyperkalemia 11. HTN 12. HLD 13. Contrast allergy 14. CKD, stage III 15. Loculated R pleural effusion  The patient presents with acute on chronic respiratory failure attributed mostly to acute on chronic combined CHF suspected  to be driven by Lasix noncompliance, marked fluid intake and possible LLE cellulitis. The patient has an extensive cardiac and vasculopathic history. No recent ischemic evaluation. He has been deemed a poor candidate for aggressive cardiac care in the past. Medical management has been pursued for ischemic cardiomyopathy/CAD. NSTEMI largely driven by demand from hypoxia, decompensated CHF +/- cellulitis; however, the patient does have severe CAD which has likely progressed since 2003. Agree with Lasix IV, BiPAP support, NTG paste, salt/fluid restriction  started by the primary team. Continue heparin for now. Plan to correct metabolic derangements and respiratory status today largely headed by CCM/pulm and IM. Will have Dr. Donnie Aho address further cardiac management tomorrow. Resume outpatient Plavix, statin. Avoid Prilosec with Plavix.? If aspirin was stopped due to recent hematuria. Check u/a. May need Foley cath given h/o urinary retention recently. Monitor strict I/Os and daily weights. Check 2D echo. Could add mortality benefiting BB such as carvedilol once CHF improved. Consider ACEi/ARB with renal monitoring. Will leave to the discretion of the patient's primary cardiologist. Stress compliance. Further management per primary, CCM/pulm teams.    I have examined the patient and reviewed assessment and plan and discussed with patient.  Agree with above as stated.    Patient has evidence of fluid overload. He has not been taking his Lasix over the last couple of days. Agree with intravenous Lasix for diuresis. He needs aggressive respiratory support as well. He has been started on antibiotics for a presumed left leg cellulitis. Will follow along. He is not a good candidate for invasive testing. Hopefully will not be necessary. Dr. Donnie Aho to follow starting tomorrow.  Henchy Mccauley S.   Signed, R. Hurman Horn, PA-C 05/30/2013, 11:11 AM

## 2013-05-30 NOTE — ED Notes (Signed)
Per EMS, Pt has been experiencing SOB x 2 days, EMS gave 2 nitro tablets and 5 of albuterol. Pt arrived on Bi-Pap. Oxygen saturation on arrival was 85%.

## 2013-05-30 NOTE — Progress Notes (Signed)
Pt is off bipap at this tme and the idea is to try to keep him off for the night.  Pt's wife expressed she would like to jkeep him off if possible.  RT explained to family we will keep a close watch and see if he may need it thru out the night.  If he flies on the [place], we can avoid the bipap.

## 2013-05-30 NOTE — Progress Notes (Signed)
Patient is having paroxysmal atrial fibrillation with rate in the 80's, not any worse symptomatically.  EKG obtained and those results reported to MD, will continue to monitor patient.

## 2013-05-30 NOTE — Progress Notes (Signed)
Per RN patient more somnolent than admission, ABGs actually improved, CT head to R/O CVA as on Hep gtt, BIPAP to continue as there is no other option he is a firm DNR, D/W family in detail, they want BIPAP only, explained risks and benefits of Aspiration vs Co2 Narcosis clearly, they agree with BIPAP. If declines they want comfort care, cards and PCCM following.

## 2013-05-30 NOTE — Progress Notes (Signed)
ANTICOAGULATION CONSULT NOTE - Initial Consult  Pharmacy Consult for Heparin Indication: chest pain/ACS  Allergies  Allergen Reactions  . Contrast Media [Iodinated Diagnostic Agents] Other (See Comments)    Shaking and feeling terrible    Patient Measurements: Height: 5\' 8"  (172.7 cm) Weight: 293 lb 8 oz (133.131 kg) IBW/kg (Calculated) : 68.4 Wt Readings from Last 3 Encounters:  05/30/13 293 lb 8 oz (133.131 kg)  05/02/13 275 lb (124.739 kg)  12/21/12 270 lb (122.471 kg)   Heparin Dosing Weight: 102 kg  Vital Signs: Temp: 97.8 F (36.6 C) (10/26 1500) Temp src: Axillary (10/26 1500) BP: 174/66 mmHg (10/26 1500) Pulse Rate: 61 (10/26 1500)  Labs:  Recent Labs  05/30/13 0620 05/30/13 0623 05/30/13 1146 05/30/13 1615 05/30/13 1821  HGB  --  12.5*  --   --   --   HCT  --  39.5  --   --   --   PLT  --  347  --   --   --   APTT  --   --  68*  --   --   LABPROT  --   --  13.8  --   --   INR  --   --  1.08  --   --   HEPARINUNFRC  --   --   --   --  <0.10*  CREATININE  --  1.35  --  1.31  --   TROPONINI 1.35*  --   --   --   --     Estimated Creatinine Clearance: 69 ml/min (by C-G formula based on Cr of 1.31).   Medical History: Past Medical History  Diagnosis Date  . Hypertension   . Epilepsy   . Peripheral vascular disease   . Ulcer     left foot  . CKD (chronic kidney disease), stage III   . Dyslipidemia   . Chronic venous insufficiency   . Sleep apnea   . Morbid obesity   . Diverticulosis   . Benign prostatic hypertrophy   . Glaucoma   . Staphylococcus aureus infection   . Gout   . Depression   . Insomnia   . CAD (coronary artery disease), native coronary artery   . GERD (gastroesophageal reflux disease)   . Heart murmur   . Myocardial infarction ~ 11/2011    "in ICU @ [facility]; stress-related"  . Angina   . Pneumonia 12/11/11  . Shortness of breath 12/11/11    "mostly w/exertion"  . Type II diabetes mellitus   . Chronic daily headache    "~ all the time"  . Seizures     hx; "don't know from what"  . Arthritis   . Chronic combined systolic and diastolic CHF (congestive heart failure)     a. Echo 12/2011: EF 30%, grade 1 dd, mild-mod biatrial enlargement  . Stroke ~ 1983    "little weaker right hand"  . Recent heart attack 12/09/11    Stress heart attack  . MRSA (methicillin resistant staph aureus) culture positive Dec. 17, 2013     right heel infection  . Ischemic cardiomyopathy     Medications:  Infusions:  . heparin 1,400 Units/hr (05/30/13 1135)    Assessment: 72 year old male admitted with shortness of breath.  His troponin is elevated and started on IV heparin for ACS, pt. Is not candidate for aggressive cardiac workup, current plan is medical management. Heparin level undetectable on 1400 units/hr. No bleeding, no interruptions  with infusion per RN.   Goal of Therapy:  Heparin level 0.3-0.7 units/ml Monitor platelets by anticoagulation protocol: Yes   Plan:  Heparin 2000 units IV bolus Increase Heparin infusion to 1700 units/hr Check Heparin level in 8 hours Daily Heparin level and CBC  Bayard Hugger, PharmD, BCPS  Clinical Pharmacist  Pager: [phone]    05/30/2013, 7:18 PM

## 2013-05-31 ENCOUNTER — Inpatient Hospital Stay (HOSPITAL_COMMUNITY): Payer: Medicare Other

## 2013-05-31 DIAGNOSIS — J9610 Chronic respiratory failure, unspecified whether with hypoxia or hypercapnia: Secondary | ICD-10-CM | POA: Insufficient documentation

## 2013-05-31 DIAGNOSIS — I6789 Other cerebrovascular disease: Secondary | ICD-10-CM

## 2013-05-31 DIAGNOSIS — I509 Heart failure, unspecified: Secondary | ICD-10-CM

## 2013-05-31 LAB — GLUCOSE, CAPILLARY
Glucose-Capillary: 172 mg/dL — ABNORMAL HIGH (ref 70–99)
Glucose-Capillary: 135 mg/dL — ABNORMAL HIGH (ref 70–99)
Glucose-Capillary: 138 mg/dL — ABNORMAL HIGH (ref 70–99)

## 2013-05-31 LAB — BASIC METABOLIC PANEL
BUN: 19 mg/dL (ref 6–23)
CO2: 25 mEq/L (ref 19–32)
Chloride: 106 mEq/L (ref 96–112)
Creatinine, Ser: 1.16 mg/dL (ref 0.50–1.35)
GFR calc Af Amer: 71 mL/min — ABNORMAL LOW (ref >90)
Glucose, Bld: 210 mg/dL — ABNORMAL HIGH (ref 70–99)
Potassium: 4 mEq/L (ref 3.5–5.1)

## 2013-05-31 LAB — CBC
HCT: 35.1 % — ABNORMAL LOW (ref 39.0–52.0)
Hemoglobin: 11 g/dL — ABNORMAL LOW (ref 13.0–17.0)
MCH: 29 pg (ref 26.0–34.0)
MCHC: 31.3 g/dL (ref 30.0–36.0)
MCV: 92.6 fL (ref 78.0–100.0)
RDW: 15.8 % — ABNORMAL HIGH (ref 11.5–15.5)

## 2013-05-31 LAB — POCT I-STAT 3, ART BLOOD GAS (G3+)
Acid-base deficit: 2 mmol/L (ref 0.0–2.0)
Bicarbonate: 24.8 mEq/L — ABNORMAL HIGH (ref 20.0–24.0)
O2 Saturation: 93 %
Patient temperature: 98.6
TCO2: 26 mmol/L (ref 0–100)
pH, Arterial: 7.303 — ABNORMAL LOW (ref 7.350–7.450)

## 2013-05-31 LAB — HEMOGLOBIN A1C
Hgb A1c MFr Bld: 7.4 % — ABNORMAL HIGH (ref <5.7)
Mean Plasma Glucose: 166 mg/dL — ABNORMAL HIGH (ref <117)

## 2013-05-31 MED ORDER — VANCOMYCIN HCL 10 G IV SOLR
1250.0000 mg | Freq: Two times a day (BID) | INTRAVENOUS | Status: DC
  Administered 2013-06-01 (×2): 1250 mg via INTRAVENOUS
  Filled 2013-05-31 (×5): qty 1250

## 2013-05-31 MED ORDER — ENOXAPARIN SODIUM 40 MG/0.4ML ~~LOC~~ SOLN
40.0000 mg | Freq: Every day | SUBCUTANEOUS | Status: DC
  Administered 2013-05-31 – 2013-06-02 (×3): 40 mg via SUBCUTANEOUS
  Filled 2013-05-31 (×4): qty 0.4

## 2013-05-31 MED ORDER — VANCOMYCIN HCL 10 G IV SOLR
1750.0000 mg | Freq: Once | INTRAVENOUS | Status: AC
  Administered 2013-05-31: 1750 mg via INTRAVENOUS
  Filled 2013-05-31: qty 1750

## 2013-05-31 MED ORDER — VANCOMYCIN HCL 10 G IV SOLR
1250.0000 mg | Freq: Two times a day (BID) | INTRAVENOUS | Status: DC
  Filled 2013-05-31: qty 1250

## 2013-05-31 MED ORDER — PERFLUTREN LIPID MICROSPHERE
1.0000 mL | INTRAVENOUS | Status: AC | PRN
  Administered 2013-05-31: 3 mL via INTRAVENOUS
  Filled 2013-05-31: qty 10

## 2013-05-31 MED ORDER — GLUCERNA SHAKE PO LIQD
237.0000 mL | Freq: Two times a day (BID) | ORAL | Status: DC
  Administered 2013-05-31 – 2013-06-02 (×4): 237 mL via ORAL

## 2013-05-31 MED ORDER — ENOXAPARIN SODIUM 40 MG/0.4ML ~~LOC~~ SOLN
40.0000 mg | SUBCUTANEOUS | Status: DC
  Filled 2013-05-31: qty 0.4

## 2013-05-31 MED ORDER — PRO-STAT SUGAR FREE PO LIQD
30.0000 mL | Freq: Two times a day (BID) | ORAL | Status: DC
  Administered 2013-05-31 – 2013-06-01 (×3): 30 mL via ORAL
  Filled 2013-05-31 (×7): qty 30

## 2013-05-31 NOTE — Progress Notes (Signed)
Subjective: [name] is a very pleasant 72 year old male with multiple medical issues who is well-known to me. Apparently stopped taking his Lasix at home after having a catheter replaced for urinary retention. Admitted with volume overload and respiratory failure and probable small non-STEMI. Patient is a no code and does not want intubation. Was tolerating BiPAP well this morning when I rounded. Patient was alert and oriented andagain I x-ray respiratory distress much improved from the emergency room.  Objective: Weight change:   Intake/Output Summary (Last 24 hours) at 05/31/13 1756 Last data filed at 05/31/13 1700  Gross per 24 hour  Intake    204 ml  Output   1600 ml  Net  -1396 ml   Filed Vitals:   05/31/13 0828 05/31/13 0900 05/31/13 1229 05/31/13 1528  BP: 138/60 122/47 109/49 141/65  Pulse: 62 68 58 61  Temp:   98.1 F (36.7 C) 98 F (36.7 C)  TempSrc:   Axillary Axillary  Resp: 24  24 24   Height:      Weight:      SpO2: 98% 97% 96% 94%    General Appearance: Alert, cooperative, no distress, appears stated age Lungs: distant breath sounds with rales in bases Heart: Regular rate and rhythm, S1 and S2 normal, no murmur, rub or gallop Abdomen: Soft, non-tender, bowel sounds active all four quadrants, no masses, no organomegaly Extremities: 3+ edema bilaterally below the knees with 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 Neuro: oriented x3, nonfocal  Lab Results: Results for orders placed during the hospital encounter of 05/30/13 (from the past 48 hour(s))  POCT I-STAT 3, BLOOD GAS (G3+)     Status: Abnormal   Collection Time    05/30/13  6:15 AM      Result Value Range   pH, Arterial 7.191 (*) 7.350 - 7.450   pCO2 arterial 62.2 (*) 35.0 - 45.0 mmHg   pO2, Arterial 288.0 (*) 80.0 - 100.0 mmHg   Bicarbonate 23.8  20.0 - 24.0 mEq/L   TCO2 26  0 - 100 mmol/L   O2 Saturation 100.0     Acid-base deficit 5.0 (*) 0.0 - 2.0 mmol/L   Patient temperature 98.6  F     Collection site RADIAL, ALLEN'S TEST ACCEPTABLE     Drawn by Operator     Sample type ARTERIAL     Comment NOTIFIED PHYSICIAN    TROPONIN I     Status: Abnormal   Collection Time    05/30/13  6:20 AM      Result Value Range   Troponin I 1.35 (*) <0.30 ng/mL   Comment:            Due to the release kinetics of cTnI,     a negative result within the first hours     of the onset of symptoms does not rule out     myocardial infarction with certainty.     If myocardial infarction is still suspected,     repeat the test at appropriate intervals.     REPEATED TO VERIFY     CRITICAL RESULT CALLED TO, READ BACK BY AND VERIFIED WITH:     Nicholas [name] [phone] Nicholas [name]  CULTURE, BLOOD (ROUTINE X 2)     Status: None   Collection Time    05/30/13  6:20 AM      Result Value Range   Specimen Description BLOOD LEFT ARM     Special Requests NONE     Culture  Setup Time       Value: 05/30/2013 14:05     Performed at [facility]   Culture       Value:        BLOOD CULTURE RECEIVED NO GROWTH TO DATE CULTURE WILL BE HELD FOR 5 DAYS BEFORE ISSUING A FINAL NEGATIVE REPORT     Performed at [facility]   Report Status PENDING    PRO B NATRIURETIC PEPTIDE     Status: Abnormal   Collection Time    05/30/13  6:20 AM      Result Value Range   Pro B Natriuretic peptide (BNP) 2668.0 (*) 0 - 125 pg/mL  CBC WITH DIFFERENTIAL     Status: Abnormal   Collection Time    05/30/13  6:23 AM      Result Value Range   WBC 13.2 (*) 4.0 - 10.5 K/uL   RBC 4.22  4.22 - 5.81 MIL/uL   Hemoglobin 12.5 (*) 13.0 - 17.0 g/dL   HCT 47.8  29.5 - 62.1 %   MCV 93.6  78.0 - 100.0 fL   MCH 29.6  26.0 - 34.0 pg   MCHC 31.6  30.0 - 36.0 g/dL   RDW 30.8 (*) 65.7 - 84.6 %   Platelets 347  150 - 400 K/uL   Neutrophils Relative % 68  43 - 77 %   Neutro Abs 9.0 (*) 1.7 - 7.7 K/uL   Lymphocytes Relative 23  12 - 46 %   Lymphs Abs 3.1  0.7 - 4.0 K/uL   Monocytes Relative 5  3 - 12 %   Monocytes Absolute  0.7  0.1 - 1.0 K/uL   Eosinophils Relative 3  0 - 5 %   Eosinophils Absolute 0.4  0.0 - 0.7 K/uL   Basophils Relative 0  0 - 1 %   Basophils Absolute 0.0  0.0 - 0.1 K/uL  COMPREHENSIVE METABOLIC PANEL     Status: Abnormal   Collection Time    05/30/13  6:23 AM      Result Value Range   Sodium 135  135 - 145 mEq/L   Potassium 7.1 (*) 3.5 - 5.1 mEq/L   Comment: REPEATED TO VERIFY     NO VISIBLE HEMOLYSIS     CRITICAL RESULT CALLED TO, READ BACK BY AND VERIFIED WITH:     Nicholas [name] [phone] Nicholas [name]   Chloride 104  96 - 112 mEq/L   CO2 21  19 - 32 mEq/L   Glucose, Bld 198 (*) 70 - 99 mg/dL   BUN 18  6 - 23 mg/dL   Creatinine, Ser 8.41  0.50 - 1.35 mg/dL   Calcium 8.9  8.4 - 32.4 mg/dL   Total Protein 7.7  6.0 - 8.3 g/dL   Albumin 2.9 (*) 3.5 - 5.2 g/dL   AST 19  0 - 37 U/L   ALT 17  0 - 53 U/L   Alkaline Phosphatase 117  39 - 117 U/L   Total Bilirubin 0.2 (*) 0.3 - 1.2 mg/dL   GFR calc non Af Amer 51 (*) >90 mL/min   GFR calc Af Amer 59 (*) >90 mL/min   Comment: (NOTE)     The eGFR has been calculated using the CKD EPI equation.     This calculation has not been validated in all clinical situations.     eGFR's persistently <90 mL/min signify possible Chronic Kidney     Disease.  CG4 I-STAT (LACTIC ACID)  Status: None   Collection Time    05/30/13  6:35 AM      Result Value Range   Lactic Acid, Venous 1.34  0.5 - 2.2 mmol/L  POCT I-STAT 3, BLOOD GAS (G3+)     Status: Abnormal   Collection Time    05/30/13  7:36 AM      Result Value Range   pH, Arterial 7.267 (*) 7.350 - 7.450   pCO2 arterial 51.1 (*) 35.0 - 45.0 mmHg   pO2, Arterial 141.0 (*) 80.0 - 100.0 mmHg   Bicarbonate 23.3  20.0 - 24.0 mEq/L   TCO2 25  0 - 100 mmol/L   O2 Saturation 99.0     Acid-base deficit 4.0 (*) 0.0 - 2.0 mmol/L   Patient temperature 98.6 F     Collection site RADIAL, ALLEN'S TEST ACCEPTABLE     Drawn by RT     Sample type ARTERIAL    CULTURE, BLOOD (ROUTINE X 2)     Status: None    Collection Time    05/30/13  8:00 AM      Result Value Range   Specimen Description BLOOD RIGHT ARM     Special Requests BOTTLES DRAWN AEROBIC AND ANAEROBIC 10CC     Culture  Setup Time       Value: 05/30/2013 14:05     Performed at [facility]   Culture       Value:        BLOOD CULTURE RECEIVED NO GROWTH TO DATE CULTURE WILL BE HELD FOR 5 DAYS BEFORE ISSUING A FINAL NEGATIVE REPORT     Performed at [facility]   Report Status PENDING    POTASSIUM     Status: Abnormal   Collection Time    05/30/13 10:28 AM      Result Value Range   Potassium 6.8 (*) 3.5 - 5.1 mEq/L   Comment: SLIGHT HEMOLYSIS     CRITICAL RESULT CALLED TO, READ BACK BY AND VERIFIED WITH:     [facility] [id number] Nicholas [name]  APTT     Status: Abnormal   Collection Time    05/30/13 11:46 AM      Result Value Range   aPTT 68 (*) 24 - 37 seconds   Comment:            IF BASELINE aPTT IS ELEVATED,     SUGGEST PATIENT RISK ASSESSMENT     BE USED TO DETERMINE APPROPRIATE     ANTICOAGULANT THERAPY.  PROTIME-INR     Status: None   Collection Time    05/30/13 11:46 AM      Result Value Range   Prothrombin Time 13.8  11.6 - 15.2 seconds   INR 1.08  0.00 - 1.49  POCT I-STAT 3, BLOOD GAS (G3+)     Status: Abnormal   Collection Time    05/30/13  2:56 PM      Result Value Range   pH, Arterial 7.303 (*) 7.350 - 7.450   pCO2 arterial 50.0 (*) 35.0 - 45.0 mmHg   pO2, Arterial 73.0 (*) 80.0 - 100.0 mmHg   Bicarbonate 24.8 (*) 20.0 - 24.0 mEq/L   TCO2 26  0 - 100 mmol/L   O2 Saturation 93.0     Acid-base deficit 2.0  0.0 - 2.0 mmol/L   Patient temperature 98.6 F     Collection site RADIAL, ALLEN'S TEST ACCEPTABLE     Drawn by Operator     Sample type ARTERIAL  BASIC METABOLIC PANEL     Status: Abnormal   Collection Time    05/30/13  4:15 PM      Result Value Range   Sodium 141  135 - 145 mEq/L   Potassium 5.5 (*) 3.5 - 5.1 mEq/L   Comment: DELTA CHECK NOTED   Chloride 105  96 - 112 mEq/L    CO2 26  19 - 32 mEq/L   Glucose, Bld 232 (*) 70 - 99 mg/dL   BUN 18  6 - 23 mg/dL   Creatinine, Ser 1.61  0.50 - 1.35 mg/dL   Calcium 9.3  8.4 - 09.6 mg/dL   GFR calc non Af Amer 53 (*) >90 mL/min   GFR calc Af Amer 62 (*) >90 mL/min   Comment: (NOTE)     The eGFR has been calculated using the CKD EPI equation.     This calculation has not been validated in all clinical situations.     eGFR's persistently <90 mL/min signify possible Chronic Kidney     Disease.  MRSA PCR SCREENING     Status: None   Collection Time    05/30/13  4:18 PM      Result Value Range   MRSA by PCR NEGATIVE  NEGATIVE   Comment:            The GeneXpert MRSA Assay (FDA     approved for NASAL specimens     only), is one component of a     comprehensive MRSA colonization     surveillance program. It is not     intended to diagnose MRSA     infection nor to guide or     monitor treatment for     MRSA infections.  HEPARIN LEVEL (UNFRACTIONATED)     Status: Abnormal   Collection Time    05/30/13  6:21 PM      Result Value Range   Heparin Unfractionated <0.10 (*) 0.30 - 0.70 IU/mL   Comment:            IF HEPARIN RESULTS ARE BELOW     EXPECTED VALUES, AND PATIENT     DOSAGE HAS BEEN CONFIRMED,     SUGGEST FOLLOW UP TESTING     OF ANTITHROMBIN III LEVELS.  POTASSIUM     Status: None   Collection Time    05/30/13  6:21 PM      Result Value Range   Potassium 4.4  3.5 - 5.1 mEq/L   Comment: DELTA CHECK NOTED  TROPONIN I     Status: Abnormal   Collection Time    05/30/13  6:21 PM      Result Value Range   Troponin I 1.45 (*) <0.30 ng/mL   Comment:            Due to the release kinetics of cTnI,     a negative result within the first hours     of the onset of symptoms does not rule out     myocardial infarction with certainty.     If myocardial infarction is still suspected,     repeat the test at appropriate intervals.     REPEATED TO VERIFY     CRITICAL VALUE NOTED.  VALUE IS CONSISTENT WITH  PREVIOUSLY REPORTED AND CALLED VALUE.  TSH     Status: None   Collection Time    05/30/13  6:21 PM      Result Value Range   TSH 0.457  0.350 - 4.500  uIU/mL   Comment: Performed at [facility]  HEMOGLOBIN A1C     Status: Abnormal   Collection Time    05/30/13  6:21 PM      Result Value Range   Hemoglobin A1C 7.4 (*) <5.7 %   Comment: (NOTE)                                                                               According to the ADA Clinical Practice Recommendations for 2011, when     HbA1c is used as a screening test:      >=6.5%   Diagnostic of Diabetes Mellitus               (if abnormal result is confirmed)     5.7-6.4%   Increased risk of developing Diabetes Mellitus     References:Diagnosis and Classification of Diabetes Mellitus,Diabetes     Care,2011,34(Suppl 1):S62-S69 and Standards of Medical Care in             Diabetes - 2011,Diabetes Care,2011,34 (Suppl 1):S11-S61.   Mean Plasma Glucose 166 (*) <117 mg/dL   Comment: Performed at [facility]  MAGNESIUM     Status: None   Collection Time    05/30/13  6:21 PM      Result Value Range   Magnesium 1.8  1.5 - 2.5 mg/dL  GLUCOSE, CAPILLARY     Status: Abnormal   Collection Time    05/30/13  9:51 PM      Result Value Range   Glucose-Capillary 246 (*) 70 - 99 mg/dL  TROPONIN I     Status: Abnormal   Collection Time    05/31/13  1:13 AM      Result Value Range   Troponin I 1.35 (*) <0.30 ng/mL   Comment:            Due to the release kinetics of cTnI,     a negative result within the first hours     of the onset of symptoms does not rule out     myocardial infarction with certainty.     If myocardial infarction is still suspected,     repeat the test at appropriate intervals.     CRITICAL VALUE NOTED.  VALUE IS CONSISTENT WITH PREVIOUSLY REPORTED AND CALLED VALUE.     REPEATED TO VERIFY  HEPARIN LEVEL (UNFRACTIONATED)     Status: Abnormal   Collection Time    05/31/13  5:00 AM      Result  Value Range   Heparin Unfractionated 0.23 (*) 0.30 - 0.70 IU/mL   Comment:            IF HEPARIN RESULTS ARE BELOW     EXPECTED VALUES, AND PATIENT     DOSAGE HAS BEEN CONFIRMED,     SUGGEST FOLLOW UP TESTING     OF ANTITHROMBIN III LEVELS.  CBC     Status: Abnormal   Collection Time    05/31/13  5:00 AM      Result Value Range   WBC 9.8  4.0 - 10.5 K/uL   RBC 3.79 (*) 4.22 - 5.81 MIL/uL   Hemoglobin 11.0 (*) 13.0 - 17.0 g/dL  HCT 35.1 (*) 39.0 - 52.0 %   MCV 92.6  78.0 - 100.0 fL   MCH 29.0  26.0 - 34.0 pg   MCHC 31.3  30.0 - 36.0 g/dL   RDW 16.1 (*) 09.6 - 04.5 %   Platelets 263  150 - 400 K/uL   Comment: DELTA CHECK NOTED     REPEATED TO VERIFY  TROPONIN I     Status: Abnormal   Collection Time    05/31/13  5:00 AM      Result Value Range   Troponin I 0.84 (*) <0.30 ng/mL   Comment:            Due to the release kinetics of cTnI,     a negative result within the first hours     of the onset of symptoms does not rule out     myocardial infarction with certainty.     If myocardial infarction is still suspected,     repeat the test at appropriate intervals.     CRITICAL VALUE NOTED.  VALUE IS CONSISTENT WITH PREVIOUSLY REPORTED AND CALLED VALUE.     REPEATED TO VERIFY  BASIC METABOLIC PANEL     Status: Abnormal   Collection Time    05/31/13  5:00 AM      Result Value Range   Sodium 140  135 - 145 mEq/L   Potassium 4.0  3.5 - 5.1 mEq/L   Chloride 106  96 - 112 mEq/L   CO2 25  19 - 32 mEq/L   Glucose, Bld 210 (*) 70 - 99 mg/dL   BUN 19  6 - 23 mg/dL   Creatinine, Ser 4.09  0.50 - 1.35 mg/dL   Calcium 8.4  8.4 - 81.1 mg/dL   GFR calc non Af Amer 62 (*) >90 mL/min   GFR calc Af Amer 71 (*) >90 mL/min   Comment: (NOTE)     The eGFR has been calculated using the CKD EPI equation.     This calculation has not been validated in all clinical situations.     eGFR's persistently <90 mL/min signify possible Chronic Kidney     Disease.  GLUCOSE, CAPILLARY     Status:  Abnormal   Collection Time    05/31/13  7:49 AM      Result Value Range   Glucose-Capillary 172 (*) 70 - 99 mg/dL  GLUCOSE, CAPILLARY     Status: Abnormal   Collection Time    05/31/13 12:47 PM      Result Value Range   Glucose-Capillary 141 (*) 70 - 99 mg/dL   Comment 1 Notify RN    GLUCOSE, CAPILLARY     Status: Abnormal   Collection Time    05/31/13  4:50 PM      Result Value Range   Glucose-Capillary 135 (*) 70 - 99 mg/dL   Comment 1 Notify RN      Studies/Results: Ct Head Wo Contrast  05/30/2013   CLINICAL DATA:  Decreased mental status. Unresponsive patient.  EXAM: CT HEAD WITHOUT CONTRAST  TECHNIQUE: Contiguous axial images were obtained from the base of the skull through the vertex without intravenous contrast.  COMPARISON:  10/04/2011  FINDINGS: The cerebellum, brainstem, cerebral peduncle, ventricular system, and basilar cisterns appear unremarkable. 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 Cerebral atrophy. No intracranial hemorrhage, mass lesion, or acute CVA. Chronic right maxillary sinusitis observed. Dense atherosclerotic calcification of the  carotid siphons. Prior right mastoidectomy.  IMPRESSION: 1. No acute intracranial findings to explain the patient's decreased mental status. 2. 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 3. Cerebral atrophy. 4. Chronic right maxillary sinusitis. 5. Atherosclerosis.   Electronically Signed   By: Herbie Baltimore M.D.   On: 05/30/2013 16:35   Dg Chest Portable 1 View  05/30/2013   CLINICAL DATA:  Shortness of Breath  EXAM: PORTABLE CHEST - 1 VIEW  COMPARISON:  Dec 11, 2011  FINDINGS: There is loculated effusion on the right. There is cardiomegaly with mild interstitial edema. There is underlying emphysema. There is no airspace consolidation. No adenopathy. Patient is status post coronary artery bypass grafting.  IMPRESSION: The  congestive heart failure superimposed on emphysema. Note that there is loculated effusion on the right.   Electronically Signed   By: Bretta Bang M.D.   On: 05/30/2013 07:04   Medications: Scheduled Meds: . allopurinol  300 mg Oral q morning - 10a  . ALPRAZolam  0.5 mg Oral Custom  . ALPRAZolam  2 mg Oral QHS  . aspirin EC  81 mg Oral Daily  . atenolol  50 mg Oral BID  . brimonidine  1 drop Both Eyes QHS   And  . timolol  1 drop Both Eyes QHS  . clopidogrel  75 mg Oral Q breakfast  . enoxaparin (LOVENOX) injection  40 mg Subcutaneous Daily  . feeding supplement (GLUCERNA SHAKE)  237 mL Oral BID BM  . feeding supplement (PRO-STAT SUGAR FREE 64)  30 mL Oral BID WC  . furosemide  80 mg Intravenous BID  . influenza vac split quadrivalent PF  0.5 mL Intramuscular Tomorrow-1000  . insulin aspart  0-5 Units Subcutaneous QHS  . insulin aspart  0-9 Units Subcutaneous TID WC  . nitroGLYCERIN  1 inch Topical Q6H  . pantoprazole  40 mg Oral Daily  . simvastatin  20 mg Oral QPM  . sodium chloride  3 mL Intravenous Q12H  . temazepam  30 mg Oral QHS  . [START ON 06/01/2013] vancomycin  1,250 mg Intravenous Q12H   Continuous Infusions:  PRN Meds:.albuterol, guaiFENesin-dextromethorphan, HYDROcodone-acetaminophen, morphine injection, nitroGLYCERIN, ondansetron (ZOFRAN) IV, ondansetron, perflutren lipid microspheres (DEFINITY) IV suspension, polyethylene glycol  Assessment/Plan: Right Pleural Effusion - small RLL effusion, likely component of breast shadow noted on CXR >> minimal on bedside u/s 10/26 - should resolve with diuresis Acute Hypercarbic Respiratory Failure  Former Tobacco Abuse  PLAN:  -CPAP QHS  -wean oxygen for sats 90-95%  -aggressive diuresis as renal fxn & BP permit  -upright positioning / pulmonary hygiene  -BD's in setting of smoking hx  -discontinue steroids  -pt would benefit from outpatient sleep study & CPAP but he unfortunately refuses.  NSTEMI - troponin peak  1.45  Decompensated CHF  HTN  CAD  PLAN:  -control BP  -lasix 80 IV BID  -heparin gtt  -Cardiology following  LLE Cellulitis - DVT negative.  PLAN:  -vancomycin per primary  Hyperkalemia  PLAN:  -kayexalate / lasix per primary  -f/u BMP  Deconditioning  PLAN:  -PT Type 2 diabetes mellitus PLAN: Continue sliding scale insulin and diabetic diet DNR PLAN: Aware BPH PLAN: continue catheter for now      Principal Problem:   Acute respiratory failure Active Problems:   Type 2 diabetes mellitus with vascular disease   PAD (peripheral artery disease)   Hyperlipidemia   BPH (benign prostatic hyperplasia)   CAD (coronary artery disease), native coronary  artery   Morbid obesity   DNR (do not resuscitate)   Chronic systolic CHF (congestive heart failure)    LOS: 1 day   Pearla Dubonnet, MD 05/31/2013, 5:56 PM

## 2013-05-31 NOTE — Progress Notes (Signed)
PULMONARY  / CRITICAL CARE MEDICINE  Name: Nicholas [name] MRN: [id number] DOB: 1940-11-28    ADMISSION DATE:  05/30/2013 CONSULTATION DATE:  10/26  REFERRING MD :  Dr. Thedore Mins PRIMARY SERVICE:  TRH  CHIEF COMPLAINT:  Pleural Effusion   BRIEF PATIENT DESCRIPTION: 72 y/o M with a complex medical history admitted with progressive SOB, LE swelling with presumed CHF exacerbation.  Found to have R pleural effusion & PCCM consulted for evaluation.   SIGNIFICANT EVENTS / STUDIES:  10/26 - admit with CHF exacerbation, acute resp fx, volume overload, NSTEMI 10/27 - significant improvement, cxr with some clearing of edema  LINES / TUBES:   CULTURES: BCx2 10/26>>>  ANTIBIOTICS: Vanco (Skin/LE) 10/26>>>   SUBJECTIVE: Pt denies acute c/o's.  Feels better.  Reports no memory of yesterdays events (admit)  VITAL SIGNS: Temp:  [97.8 F (36.6 C)-98.7 F (37.1 C)] 98.1 F (36.7 C) (10/27 1229) Pulse Rate:  [57-79] 58 (10/27 1229) Resp:  [20-24] 24 (10/27 1229) BP: (105-174)/(42-69) 109/49 mmHg (10/27 1229) SpO2:  [88 %-100 %] 96 % (10/27 1229) FiO2 (%):  [35 %] 35 % (10/27 0700) Weight:  [293 lb 10.4 oz (133.2 kg)] 293 lb 10.4 oz (133.2 kg) (10/27 0353)  PHYSICAL EXAMINATION: General:  Morbidly obese male in NAD  Neuro:  Awake, Alert, answers questions appropriately, MAE HEENT:  Mm pink/moist, short / thick neck Cardiovascular:  s1s2 rrr, no m/r/g Lungs:  resp's even/non-labored, lungs bilaterally diminished, fine crackles posterior lower Abdomen:  Obese, soft, bsx4 active Musculoskeletal:  No acute deformities Skin:  Warm/dry, 3+ pitting edema in BLE, LLE with erythema / warmth concerning for cellulitis   Recent Labs Lab 05/30/13 0623  05/30/13 1615 05/30/13 1821 05/31/13 0500  NA 135  --  141  --  140  K 7.1*  < > 5.5* 4.4 4.0  CL 104  --  105  --  106  CO2 21  --  26  --  25  BUN 18  --  18  --  19  CREATININE 1.35  --  1.31  --  1.16  GLUCOSE 198*  --  232*  --  210*   < > = values in this interval not displayed.  Recent Labs Lab 05/30/13 0623 05/31/13 0500  HGB 12.5* 11.0*  HCT 39.5 35.1*  WBC 13.2* 9.8  PLT 347 263   Ct Head Wo Contrast  05/30/2013   CLINICAL DATA:  Decreased mental status. Unresponsive patient.  EXAM: CT HEAD WITHOUT CONTRAST  TECHNIQUE: Contiguous axial images were obtained from the base of the skull through the vertex without intravenous contrast.  COMPARISON:  10/04/2011  FINDINGS: The cerebellum, brainstem, cerebral peduncle, ventricular system, and basilar cisterns appear unremarkable. 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 Cerebral atrophy. No intracranial hemorrhage, mass lesion, or acute CVA. Chronic right maxillary sinusitis observed. Dense atherosclerotic calcification of the carotid siphons. Prior right mastoidectomy.  IMPRESSION: 1. No acute intracranial findings to explain the patient's decreased mental status. 2. 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 3. Cerebral atrophy. 4. Chronic right maxillary sinusitis. 5. Atherosclerosis.   Electronically Signed   By: Herbie Baltimore M.D.   On: 05/30/2013 16:35   Dg Chest Portable 1 View  05/30/2013   CLINICAL DATA:  Shortness of Breath  EXAM: PORTABLE CHEST - 1 VIEW  COMPARISON:  Dec 11, 2011  FINDINGS: There is loculated effusion on the  right. There is cardiomegaly with mild interstitial edema. There is underlying emphysema. There is no airspace consolidation. No adenopathy. Patient is status post coronary artery bypass grafting.  IMPRESSION: The congestive heart failure superimposed on emphysema. Note that there is loculated effusion on the right.   Electronically Signed   By: Bretta Bang M.D.   On: 05/30/2013 07:04    ASSESSMENT / PLAN:  Right Pleural Effusion - small RLL effusion, likely component of breast shadow noted on CXR >> minimal on bedside u/s  10/26 Acute Hypercarbic Respiratory Failure Former Tobacco Abuse  PLAN: -CPAP QHS -wean oxygen for sats 90-95% -aggressive diuresis as renal fxn & BP permit -upright positioning / pulmonary hygiene -BD's in setting of smoking hx -no need for further steroids -pt would benefit from outpatient sleep study & CPAP but he unfortunately refuses.   NSTEMI - troponin peak 1.45 Decompensated CHF HTN CAD  PLAN: -control BP -lasix 80 IV BID -heparin gtt -Cardiology following   LLE Cellulitis - DVT negative.  PLAN: -vancomycin per primary  Hyperkalemia  PLAN: -kayexalate / lasix per primary -f/u BMP    Deconditioning  PLAN: -PT   Canary Brim, NP-C [facility] Pgr: [phone] or [phone]  05/31/2013, 1:30 PM  CPAP at night, refused sleep study, continue O2 at night, no need for a thora at this time.  PCCM will sign off, please call back if needed.  Patient seen and examined, agree with above note.  I dictated the care and orders written for this patient under my direction.  Alyson Reedy, MD [phone]

## 2013-05-31 NOTE — Progress Notes (Signed)
INITIAL NUTRITION ASSESSMENT  DOCUMENTATION CODES Per approved criteria  -Morbid Obesity   INTERVENTION:  Glucerna Shake twice daily between (220 kcals, 10 gm protein per 8 fl oz bottle)  Prostat liquid protein 30 ml twice daily with meals (100 kcals, 15 gm protein per dose) RD to follow for nutrition care plan  NUTRITION DIAGNOSIS: Increased nutrient needs related to wound healing as evidenced by estimated nutrition needs  Goal: Pt to meet >/= 90% of their estimated nutrition needs   Monitor:  PO & supplemental intake, weight, labs, I/O's  Reason for Assessment: Low Braden  72 y.o. male  Admitting Dx: Acute respiratory failure  ASSESSMENT: Patient with PMH of CVA, HTN, epilepsy, CKD and DM admitted with progressive SOB, LE swelling with presumed CHF exacerbation; found to have R pleural effusion.  Patient complaining of not "having enough air" upon RD visit -- RN notified; no % PO intake available per flowsheet records; patient with hx of poor PO intake; would benefit from addition of nutrition supplements -- RD to order.  Patient with chronic Stage IV sacral wound & left foot full thickness wound.  Height: Ht Readings from Last 1 Encounters:  05/30/13 5\' 8"  (1.727 m)    Weight: Wt Readings from Last 1 Encounters:  05/31/13 293 lb 10.4 oz (133.2 kg)    Ideal Body Weight: 154 lb  % Ideal Body Weight: 190%  Wt Readings from Last 10 Encounters:  05/31/13 293 lb 10.4 oz (133.2 kg)  05/02/13 275 lb (124.739 kg)  12/21/12 270 lb (122.471 kg)  08/31/12 275 lb (124.739 kg)  06/02/12 275 lb (124.739 kg)  06/02/12 275 lb (124.739 kg)  05/18/12 283 lb (128.368 kg)  01/21/12 270 lb (122.471 kg)  01/21/12 270 lb (122.471 kg)  01/21/12 270 lb (122.471 kg)    Usual Body Weight: 275 lb  % Usual Body Weight: 106%  BMI:  Body mass index is 44.66 kg/(m^2).  Estimated Nutritional Needs: Kcal: 2200-2400 Protein: 120-130 gm Fluid: 2.2-2.4 L  Skin: Stage IV sacral  pressure ulcer, chronic left foot wound  Diet Order: Full Liquid  EDUCATION NEEDS: -No education needs identified at this time   Intake/Output Summary (Last 24 hours) at 05/31/13 1331 Last data filed at 05/31/13 0700  Gross per 24 hour  Intake    204 ml  Output   2850 ml  Net  -2646 ml    Labs:   Recent Labs Lab 05/30/13 0623  05/30/13 1615 05/30/13 1821 05/31/13 0500  NA 135  --  141  --  140  K 7.1*  < > 5.5* 4.4 4.0  CL 104  --  105  --  106  CO2 21  --  26  --  25  BUN 18  --  18  --  19  CREATININE 1.35  --  1.31  --  1.16  CALCIUM 8.9  --  9.3  --  8.4  MG  --   --   --  1.8  --   GLUCOSE 198*  --  232*  --  210*  < > = values in this interval not displayed.  CBG (last 3)   Recent Labs  05/30/13 2151 05/31/13 0749 05/31/13 1247  GLUCAP 246* 172* 141*    Scheduled Meds: . allopurinol  300 mg Oral q morning - 10a  . ALPRAZolam  0.5 mg Oral Custom  . ALPRAZolam  2 mg Oral QHS  . aspirin EC  81 mg Oral Daily  . atenolol  50 mg Oral BID  . brimonidine  1 drop Both Eyes QHS   And  . timolol  1 drop Both Eyes QHS  . clopidogrel  75 mg Oral Q breakfast  . enoxaparin (LOVENOX) injection  40 mg Subcutaneous Daily  . furosemide  80 mg Intravenous BID  . influenza vac split quadrivalent PF  0.5 mL Intramuscular Tomorrow-1000  . insulin aspart  0-5 Units Subcutaneous QHS  . insulin aspart  0-9 Units Subcutaneous TID WC  . nitroGLYCERIN  1 inch Topical Q6H  . pantoprazole  40 mg Oral Daily  . simvastatin  20 mg Oral QPM  . sodium chloride  3 mL Intravenous Q12H  . temazepam  30 mg Oral QHS  . [START ON 06/01/2013] vancomycin  1,250 mg Intravenous Q12H  . vancomycin  1,750 mg Intravenous Once    Continuous Infusions:   Past Medical History  Diagnosis Date  . Hypertension   . Epilepsy   . Peripheral vascular disease   . Ulcer     left foot  . CKD (chronic kidney disease), stage III   . Dyslipidemia   . Chronic venous insufficiency   . Sleep  apnea   . Morbid obesity   . Diverticulosis   . Benign prostatic hypertrophy   . Glaucoma   . Staphylococcus aureus infection   . Gout   . Depression   . Insomnia   . CAD (coronary artery disease), native coronary artery   . GERD (gastroesophageal reflux disease)   . Heart murmur   . Myocardial infarction ~ 11/2011    "in ICU @ [facility]; stress-related"  . Angina   . Pneumonia 12/11/11  . Shortness of breath 12/11/11    "mostly w/exertion"  . Type II diabetes mellitus   . Chronic daily headache     "~ all the time"  . Seizures     hx; "don't know from what"  . Arthritis   . Chronic combined systolic and diastolic CHF (congestive heart failure)     a. Echo 12/2011: EF 30%, grade 1 dd, mild-mod biatrial enlargement  . Stroke ~ 1983    "little weaker right hand"  . Recent heart attack 12/09/11    Stress heart attack  . MRSA (methicillin resistant staph aureus) culture positive Dec. 17, 2013     right heel infection  . Ischemic cardiomyopathy     Past Surgical History  Procedure Laterality Date  . Appendectomy    . Lumbar laminectomy    . Mastoid debridement    . Knee and ankle surgery  1979    "caved in in a ditch; messed up legs"; right  . Cholecystectomy    . Back surgery      "5 or 6"  . Peripheral arterial stent graft      "don't remember which side"  . Coronary artery bypass graft  1983; 1992    "CABG X 5; CABG X 8"  . Lower extremity angiogram  01/21/2012    Procedure: LOWER EXTREMITY ANGIOGRAM;  Surgeon: Nada Libman, MD;  Location: [facility] OR;  Service: Vascular;  Laterality: Bilateral;  WITH POSSIBLE INTERVENTION  . Eye surgery      "laser; both eyes"  . Eye surgery  05/08/2012    Cataract Left eye  . Heel bx  Dec. 17, 2013    Right Heel Bx.    . Transluminal atherectomy popliteal artery  01/21/2012    Left    Maureen Chatters, RD, LDN Pager #: [phone]  After-Hours Pager #: [phone]

## 2013-05-31 NOTE — Consult Note (Deleted)
PULMONARY  / CRITICAL CARE MEDICINE  Name: Nicholas [name] MRN: [id number] DOB: 1941/05/22    ADMISSION DATE:  05/30/2013 CONSULTATION DATE:  10/26  REFERRING MD :  Dr. Thedore Mins PRIMARY SERVICE:  TRH  CHIEF COMPLAINT:  Pleural Effusion   BRIEF PATIENT DESCRIPTION: 72 y/o M with a complex medical history admitted with progressive SOB, LE swelling with presumed CHF exacerbation.  Found to have R pleural effusion & PCCM consulted for evaluation.   SIGNIFICANT EVENTS / STUDIES:  10/26 - admit with CHF exacerbation, acute resp fx, volume overload, NSTEMI 10/27 - significant improvement, cxr with some clearing of edema  LINES / TUBES:   CULTURES: BCx2 10/26>>>  ANTIBIOTICS: Vanco (Skin/LE)10/26>>>   SUBJECTIVE: Pt denies acute c/o's.  Feels better.  Reports no memory of yesterdays events (admit)  VITAL SIGNS: Temp:  [97.8 F (36.6 C)-98.7 F (37.1 C)] 98.1 F (36.7 C) (10/27 1229) Pulse Rate:  [57-79] 58 (10/27 1229) Resp:  [20-24] 24 (10/27 1229) BP: (105-174)/(42-69) 109/49 mmHg (10/27 1229) SpO2:  [88 %-100 %] 96 % (10/27 1229) FiO2 (%):  [35 %] 35 % (10/27 0700) Weight:  [293 lb 10.4 oz (133.2 kg)] 293 lb 10.4 oz (133.2 kg) (10/27 0353)  PHYSICAL EXAMINATION: General:  Morbidly obese male in NAD  Neuro:  Awake, Alert, answers questions appropriately, MAE HEENT:  Mm pink/moist, short / thick neck Cardiovascular:  s1s2 rrr, no m/r/g Lungs:  resp's even/non-labored, lungs bilaterally diminished, fine crackles posterior lower Abdomen:  Obese, soft, bsx4 active Musculoskeletal:  No acute deformities Skin:  Warm/dry, 3+ pitting edema in BLE, LLE with erythema / warmth concerning for cellulitis   Recent Labs Lab 05/30/13 0623  05/30/13 1615 05/30/13 1821 05/31/13 0500  NA 135  --  141  --  140  K 7.1*  < > 5.5* 4.4 4.0  CL 104  --  105  --  106  CO2 21  --  26  --  25  BUN 18  --  18  --  19  CREATININE 1.35  --  1.31  --  1.16  GLUCOSE 198*  --  232*  --  210*   < > = values in this interval not displayed.  Recent Labs Lab 05/30/13 0623 05/31/13 0500  HGB 12.5* 11.0*  HCT 39.5 35.1*  WBC 13.2* 9.8  PLT 347 263   Ct Head Wo Contrast  05/30/2013   CLINICAL DATA:  Decreased mental status. Unresponsive patient.  EXAM: CT HEAD WITHOUT CONTRAST  TECHNIQUE: Contiguous axial images were obtained from the base of the skull through the vertex without intravenous contrast.  COMPARISON:  10/04/2011  FINDINGS: The cerebellum, brainstem, cerebral peduncle, ventricular system, and basilar cisterns appear unremarkable. 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 Cerebral atrophy. No intracranial hemorrhage, mass lesion, or acute CVA. Chronic right maxillary sinusitis observed. Dense atherosclerotic calcification of the carotid siphons. Prior right mastoidectomy.  IMPRESSION: 1. No acute intracranial findings to explain the patient's decreased mental status. 2. 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 3. Cerebral atrophy. 4. Chronic right maxillary sinusitis. 5. Atherosclerosis.   Electronically Signed   By: Herbie Baltimore M.D.   On: 05/30/2013 16:35   Dg Chest Portable 1 View  05/30/2013   CLINICAL DATA:  Shortness of Breath  EXAM: PORTABLE CHEST - 1 VIEW  COMPARISON:  Dec 11, 2011  FINDINGS: There is loculated effusion on the right.  There is cardiomegaly with mild interstitial edema. There is underlying emphysema. There is no airspace consolidation. No adenopathy. Patient is status post coronary artery bypass grafting.  IMPRESSION: The congestive heart failure superimposed on emphysema. Note that there is loculated effusion on the right.   Electronically Signed   By: Bretta Bang M.D.   On: 05/30/2013 07:04    ASSESSMENT / PLAN:  Right Pleural Effusion - small RLL effusion, likely component of breast shadow noted on CXR >> minimal on bedside u/s  10/26 Acute Hypercarbic Respiratory Failure Former Tobacco Abuse  PLAN: -CPAP QHS -wean oxygen for sats 90-95% -aggressive diuresis as renal fxn & BP permit -upright positioning / pulmonary hygiene -BD's in setting of smoking hx -no need for further steroids -pt would benefit from outpatient sleep study & CPAP but he unfortunately refuses.   NSTEMI - troponin peak 1.45 Decompensated CHF HTN CAD  PLAN: -control BP -lasix 80 IV BID -heparin gtt -Cardiology following   LLE Cellulitis - DVT negative.  PLAN: -vancomycin per primary  Hyperkalemia  PLAN: -kayexalate / lasix per primary -f/u BMP    Deconditioning  PLAN: -PT    Canary Brim, NP-C [facility] Pgr: [phone] or [phone]  05/31/2013, 1:06 PM

## 2013-05-31 NOTE — Progress Notes (Signed)
Subjective:  Patient feels better at the present time. His current illness is precipitated by the 2 months of progressive decline with weakness. He has very poor mobility at home and had actually stopped taking his furosemide when he had a catheter put in and felt he did not need it any more.  Objective:  Vital Signs in the last 24 hours: BP 122/47  Pulse 68  Temp(Src) 98.4 F (36.9 C) (Axillary)  Resp 24  Ht 5\' 8"  (1.727 m)  Wt 133.2 kg (293 lb 10.4 oz)  BMI 44.66 kg/m2  SpO2 97%  Physical Exam: Obese very large [place] American male who is currently in no acute distress Lungs:  Clear Cardiac:  Regular rhythm, normal S1 and S2, no S3 Abdomen:  Very large Extremities:  3+ edema noted  Intake/Output from previous day: 10/26 0701 - 10/27 0700 In: 204 [I.V.:204] Out: 2850 [Urine:2850]  Weight Filed Weights   05/30/13 1251 05/31/13 0353  Weight: 133.131 kg (293 lb 8 oz) 133.2 kg (293 lb 10.4 oz)    Lab Results: Basic Metabolic Panel:  Recent Labs  40/98/11 1615 05/30/13 1821 05/31/13 0500  NA 141  --  140  K 5.5* 4.4 4.0  CL 105  --  106  CO2 26  --  25  GLUCOSE 232*  --  210*  BUN 18  --  19  CREATININE 1.31  --  1.16   CBC:  Recent Labs  05/30/13 0623 05/31/13 0500  WBC 13.2* 9.8  NEUTROABS 9.0*  --   HGB 12.5* 11.0*  HCT 39.5 35.1*  MCV 93.6 92.6  PLT 347 263   Cardiac Enzymes:  Recent Labs  05/30/13 1821 05/31/13 0113 05/31/13 0500  TROPONINI 1.45* 1.35* 0.84*    Telemetry: Currently in sinus rhythm  Assessment/Plan: 1. Acute on chronic systolic and diastolic heart failure likely worse because of failure to take diuretics 2. Acute on chronic respiratory failure likely to obesity hypoventilation syndrome 3. Non-ST elevation microinfarction likely demand ischemia-he is not a candidate for further cardiac ischemia testing or intervention 4. Peripheral vascular disease 5. Diabetes mellitus  Recommendations:  I would stop his heparin at  this time and change him to DVT prophylaxis. He has significant CO2 retention I would try to get them out of the bed and sitting him up. He needs to have extensive diuresis and would keep him in the hospital to diurese him as long as his renal function allows.      Darden Palmer  MD [facility] Cardiology  05/31/2013, 10:07 AM

## 2013-05-31 NOTE — Progress Notes (Signed)
Inpatient Diabetes Program Recommendations  AACE/ADA: New Consensus Statement on Inpatient Glycemic Control (2013)  Target Ranges:  Prepandial:   less than 140 mg/dL      Peak postprandial:   less than 180 mg/dL (1-2 hours)      Critically ill patients:  140 - 180 mg/dL  Results for Nicholas [name], Nicholas [name] (MRN [id number]) as of 05/31/2013 10:45  Ref. Range 05/30/2013 21:51  Glucose-Capillary Latest Range: 70-99 mg/dL 409 (H)   Inpatient Diabetes Program Recommendations Insulin - Basal: start a portion of patient's home dose Lantus  Thank you  [name] BSN, RN,CDE Inpatient Diabetes Coordinator [phone] (team pager)

## 2013-05-31 NOTE — Progress Notes (Signed)
ANTICOAGULATION CONSULT NOTE - Follow Up Consult  Pharmacy Consult for heparin Indication: NSTEMI  Labs:  Recent Labs  05/30/13 0620 05/30/13 0623 05/30/13 1146 05/30/13 1615 05/30/13 1821 05/31/13 0113 05/31/13 0500  HGB  --  12.5*  --   --   --   --  11.0*  HCT  --  39.5  --   --   --   --  35.1*  PLT  --  347  --   --   --   --  263  APTT  --   --  68*  --   --   --   --   LABPROT  --   --  13.8  --   --   --   --   INR  --   --  1.08  --   --   --   --   HEPARINUNFRC  --   --   --   --  <0.10*  --  0.23*  CREATININE  --  1.35  --  1.31  --   --  1.16  TROPONINI 1.35*  --   --   --  1.45* 1.35* 0.84*    Assessment: 72yo male remains subtherapeutic on heparin after rate increase though approaching goal.  Goal of Therapy:  Heparin level 0.3-0.7 units/ml   Plan:  Will increase heparin gtt by 2 units/kg/hr to 1900 units/hr and check level in 8hr.  Vernard Gambles, PharmD, BCPS  05/31/2013,6:46 AM

## 2013-05-31 NOTE — Care Management Note (Signed)
    Page 1 of 1   05/31/2013     1:28:03 PM   CARE MANAGEMENT NOTE 05/31/2013  Patient:  Nicholas [name], Nicholas [name]   Account Number:  [id number]  Date Initiated:  05/31/2013  Documentation initiated by:  Junius Creamer  Subjective/Objective Assessment:   adm w resp distress, hyperkalemia     Action/Plan:   lives w wife, pcp dr Molly Maduro gates   Anticipated DC Date:     Anticipated DC Plan:        DC Planning Services  CM consult      Choice offered to / List presented to:             Status of service:   Medicare Important Message given?   (If response is "NO", the following Medicare IM given date fields will be blank) Date Medicare IM given:   Date Additional Medicare IM given:    Discharge Disposition:    Per UR Regulation:  Reviewed for med. necessity/level of care/duration of stay  If discussed at Long Length of Stay Meetings, dates discussed:    Comments:

## 2013-06-01 LAB — CBC
Hemoglobin: 11.6 g/dL — ABNORMAL LOW (ref 13.0–17.0)
MCH: 28.8 pg (ref 26.0–34.0)
MCV: 93.1 fL (ref 78.0–100.0)
Platelets: 289 10*3/uL (ref 150–400)
RBC: 4.03 MIL/uL — ABNORMAL LOW (ref 4.22–5.81)
RDW: 15.8 % — ABNORMAL HIGH (ref 11.5–15.5)
WBC: 12 10*3/uL — ABNORMAL HIGH (ref 4.0–10.5)

## 2013-06-01 LAB — BASIC METABOLIC PANEL
CO2: 25 mEq/L (ref 19–32)
Calcium: 8.6 mg/dL (ref 8.4–10.5)
Chloride: 103 mEq/L (ref 96–112)
Creatinine, Ser: 1.45 mg/dL — ABNORMAL HIGH (ref 0.50–1.35)
GFR calc Af Amer: 54 mL/min — ABNORMAL LOW (ref >90)
Potassium: 3.9 mEq/L (ref 3.5–5.1)

## 2013-06-01 LAB — GLUCOSE, CAPILLARY
Glucose-Capillary: 152 mg/dL — ABNORMAL HIGH (ref 70–99)
Glucose-Capillary: 165 mg/dL — ABNORMAL HIGH (ref 70–99)

## 2013-06-01 LAB — PRO B NATRIURETIC PEPTIDE: Pro B Natriuretic peptide (BNP): 3738 pg/mL — ABNORMAL HIGH (ref 0–125)

## 2013-06-01 MED ORDER — INSULIN ASPART 100 UNIT/ML ~~LOC~~ SOLN
0.0000 [IU] | Freq: Three times a day (TID) | SUBCUTANEOUS | Status: DC
  Administered 2013-06-01 – 2013-06-02 (×4): 3 [IU] via SUBCUTANEOUS

## 2013-06-01 MED ORDER — ISOSORBIDE MONONITRATE ER 30 MG PO TB24
30.0000 mg | ORAL_TABLET | Freq: Every day | ORAL | Status: DC
  Administered 2013-06-01 – 2013-06-03 (×3): 30 mg via ORAL
  Filled 2013-06-01 (×3): qty 1

## 2013-06-01 MED ORDER — TAMSULOSIN HCL 0.4 MG PO CAPS
0.4000 mg | ORAL_CAPSULE | Freq: Every evening | ORAL | Status: DC | PRN
  Administered 2013-06-01 – 2013-06-02 (×2): 0.4 mg via ORAL
  Filled 2013-06-01 (×6): qty 1

## 2013-06-01 MED ORDER — INSULIN GLARGINE 100 UNIT/ML ~~LOC~~ SOLN
10.0000 [IU] | Freq: Every day | SUBCUTANEOUS | Status: DC
  Administered 2013-06-01 – 2013-06-02 (×2): 10 [IU] via SUBCUTANEOUS
  Filled 2013-06-01 (×3): qty 0.1

## 2013-06-01 MED ORDER — POTASSIUM CHLORIDE CRYS ER 20 MEQ PO TBCR
20.0000 meq | EXTENDED_RELEASE_TABLET | Freq: Two times a day (BID) | ORAL | Status: DC
  Administered 2013-06-01 (×2): 20 meq via ORAL
  Filled 2013-06-01 (×4): qty 1

## 2013-06-01 MED ORDER — PERPHENAZINE 8 MG PO TABS
8.0000 mg | ORAL_TABLET | Freq: Every day | ORAL | Status: DC
  Administered 2013-06-01 – 2013-06-02 (×2): 8 mg via ORAL
  Filled 2013-06-01 (×3): qty 1

## 2013-06-01 NOTE — Evaluation (Signed)
Physical Therapy Evaluation Patient Details Name: Nicholas [name] MRN: [id number] DOB: 1941-02-09 Today's Date: 06/01/2013 Time: 4098-1191 PT Time Calculation (min): 30 min  PT Assessment / Plan / Recommendation History of Present Illness  72 y/o M with a complex medical history admitted with progressive SOB, LE swelling with presumed CHF exacerbation.  Found to have R pleural effusion   Clinical Impression  Pleasant gentleman with baseline limited mobility with need for assist with ADLs. Pt with decreased mobility over last month after twisting his ankle per family report. At baseline pt able to walk at least 30' with RW then uses [facility] for greater distances. Pt with decreased strength and activity tolerance currently and will benefit from acute therapy to maximize mobility, function and gait to decrease burden of care. Pt with noted left heel wound which pt states staff is aware of and it does not require a dressing.     PT Assessment  Patient needs continued PT services    Follow Up Recommendations  Home health PT;Supervision/Assistance - 24 hour    Does the patient have the potential to tolerate intense rehabilitation      Barriers to Discharge        Equipment Recommendations  None recommended by PT    Recommendations for Other Services     Frequency Min 3X/week    Precautions / Restrictions Precautions Precautions: Fall Precaution Comments: wound on left heel   Pertinent Vitals/Pain No pain Pt with sats 92% on 2L with drop to 88% EOB on RA after 1 min with return to 2L throughout evaluation and maintained 94% HR 57-61 119/47 (64)      Mobility  Bed Mobility Bed Mobility: Rolling Right;Right Sidelying to Sit;Sitting - Scoot to Edge of Bed Rolling Right: With rail;6: Modified independent (Device/Increase time) Right Sidelying to Sit: 5: Supervision;With rails;HOB elevated Sitting - Scoot to Edge of Bed: 6: Modified independent (Device/Increase time) Details for Bed  Mobility Assistance: increased time for all transfers Transfers Transfers: Sit to Stand;Stand to Sit Sit to Stand: 4: Min assist;From bed Stand to Sit: 4: Min assist;To chair/3-in-1;To bed Details for Transfer Assistance: cueing for hand placement and safety with increased time to complete Ambulation/Gait Ambulation/Gait Assistance: 4: Min guard Ambulation Distance (Feet): 5 Feet Assistive device: Rolling walker Ambulation/Gait Assistance Details: cueing for posture and to step into RW with chair pulled behind pt for safety Gait Pattern: Wide base of support;Shuffle;Trunk flexed Gait velocity: decreased Stairs: No    Exercises     PT Diagnosis: Difficulty walking;Generalized weakness  PT Problem List: Decreased strength;Decreased activity tolerance;Decreased mobility;Decreased knowledge of use of DME;Obesity;Decreased skin integrity;Cardiopulmonary status limiting activity PT Treatment Interventions: Gait training;Stair training;Functional mobility training;Therapeutic activities;Therapeutic exercise;Patient/family education;DME instruction     PT Goals(Current goals can be found in the care plan section) Acute Rehab PT Goals Patient Stated Goal: be able to walk in the house PT Goal Formulation: With patient Time For Goal Achievement: 06/01/13 Potential to Achieve Goals: Fair  Visit Information  Last PT Received On: 06/01/13 Assistance Needed: +1 History of Present Illness: 72 y/o M with a complex medical history admitted with progressive SOB, LE swelling with presumed CHF exacerbation.  Found to have R pleural effusion        Prior Functioning  Home Living Family/patient expects to be discharged to:: Private residence Living Arrangements: Spouse/significant other Available Help at Discharge: Family;Available 24 hours/day Type of Home: House Home Access: Stairs to enter [employer] of Steps: 2 Home Layout: One level Home  Equipment: Dan Humphreys - 2 wheels;Shower  seat;Wheelchair - IT trainer;Bedside commode;Other (comment) (lift chair) Prior Function Level of Independence: Independent with assistive device(s) Comments: wife assist with all bathing and dressing, son assists with shaving and getting him out of the house Communication Communication: No difficulties    Cognition  Cognition Arousal/Alertness: Awake/alert Behavior During Therapy: WFL for tasks assessed/performed Overall Cognitive Status: Within Functional Limits for tasks assessed    Extremity/Trunk Assessment Upper Extremity Assessment Upper Extremity Assessment: Generalized weakness Lower Extremity Assessment Lower Extremity Assessment: Generalized weakness (edema of bil LE particularly feet) Cervical / Trunk Assessment Cervical / Trunk Assessment: Normal   Balance    End of Session PT - End of Session Equipment Utilized During Treatment: Gait belt Activity Tolerance: Patient limited by fatigue Patient left: in chair;with call bell/phone within reach;with family/visitor present Nurse Communication: Mobility status  GP     Delorse Lek 06/01/2013, 10:34 AM Delaney Meigs, PT [phone]

## 2013-06-01 NOTE — Progress Notes (Addendum)
Pt sat in the chair for 3 hours; walked from chair to bathroom with walker and minimum assistance.  Agree with the above note.

## 2013-06-01 NOTE — Progress Notes (Signed)
Subjective: Patient feeling much better today.  More alert.  Not short of breath.  Refuse BiPAP last night.  Oxygenating well currently.  Vital signs stable  Objective: Weight change: -3.131 kg (-6 lb 14.4 oz)  Intake/Output Summary (Last 24 hours) at 06/01/13 0758 Last data filed at 06/01/13 0500  Gross per 24 hour  Intake      3 ml  Output   3100 ml  Net  -3097 ml   Filed Vitals:   05/31/13 1528 05/31/13 2000 06/01/13 0039 06/01/13 0416  BP: 141/65 142/63 121/51 132/63  Pulse: 61 65 64 65  Temp: 98 F (36.7 C)  98.8 F (37.1 C) 98.5 F (36.9 C)  TempSrc: Axillary  Axillary Axillary  Resp: 24     Height:      Weight:    130 kg (286 lb 9.6 oz)  SpO2: 94% 94% 96% 92%    General Appearance: Alert, cooperative, no distress, appears stated age  Lungs: distant breath sounds with rales in bases  Heart: Regular rate and rhythm, S1 and S2 normal, no murmur, rub or gallop  Abdomen: Soft, non-tender, bowel sounds active all four quadrants, no masses, no organomegaly  Extremities: 2-3+ edema bilaterally below the knees with 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  Neuro: oriented x3, nonfocal  Lab Results: Results for orders placed during the hospital encounter of 05/30/13 (from the past 48 hour(s))  CULTURE, BLOOD (ROUTINE X 2)     Status: None   Collection Time    05/30/13  8:00 AM      Result Value Range   Specimen Description BLOOD RIGHT ARM     Special Requests BOTTLES DRAWN AEROBIC AND ANAEROBIC 10CC     Culture  Setup Time       Value: 05/30/2013 14:05     Performed at [facility]   Culture       Value:        BLOOD CULTURE RECEIVED NO GROWTH TO DATE CULTURE WILL BE HELD FOR 5 DAYS BEFORE ISSUING A FINAL NEGATIVE REPORT     Performed at [facility]   Report Status PENDING    POTASSIUM     Status: Abnormal   Collection Time    05/30/13 10:28 AM      Result Value Range   Potassium 6.8 (*) 3.5 - 5.1 mEq/L   Comment: SLIGHT HEMOLYSIS   CRITICAL RESULT CALLED TO, READ BACK BY AND VERIFIED WITH:     [facility] [id number] [place]  APTT     Status: Abnormal   Collection Time    05/30/13 11:46 AM      Result Value Range   aPTT 68 (*) 24 - 37 seconds   Comment:            IF BASELINE aPTT IS ELEVATED,     SUGGEST PATIENT RISK ASSESSMENT     BE USED TO DETERMINE APPROPRIATE     ANTICOAGULANT THERAPY.  PROTIME-INR     Status: None   Collection Time    05/30/13 11:46 AM      Result Value Range   Prothrombin Time 13.8  11.6 - 15.2 seconds   INR 1.08  0.00 - 1.49  POCT I-STAT 3, BLOOD GAS (G3+)     Status: Abnormal   Collection Time    05/30/13  2:56 PM      Result Value Range   pH, Arterial 7.303 (*) 7.350 - 7.450   pCO2 arterial 50.0 (*)  35.0 - 45.0 mmHg   pO2, Arterial 73.0 (*) 80.0 - 100.0 mmHg   Bicarbonate 24.8 (*) 20.0 - 24.0 mEq/L   TCO2 26  0 - 100 mmol/L   O2 Saturation 93.0     Acid-base deficit 2.0  0.0 - 2.0 mmol/L   Patient temperature 98.6 F     Collection site RADIAL, ALLEN'S TEST ACCEPTABLE     Drawn by Operator     Sample type ARTERIAL    BASIC METABOLIC PANEL     Status: Abnormal   Collection Time    05/30/13  4:15 PM      Result Value Range   Sodium 141  135 - 145 mEq/L   Potassium 5.5 (*) 3.5 - 5.1 mEq/L   Comment: DELTA CHECK NOTED   Chloride 105  96 - 112 mEq/L   CO2 26  19 - 32 mEq/L   Glucose, Bld 232 (*) 70 - 99 mg/dL   BUN 18  6 - 23 mg/dL   Creatinine, Ser 1.61  0.50 - 1.35 mg/dL   Calcium 9.3  8.4 - 09.6 mg/dL   GFR calc non Af Amer 53 (*) >90 mL/min   GFR calc Af Amer 62 (*) >90 mL/min   Comment: (NOTE)     The eGFR has been calculated using the CKD EPI equation.     This calculation has not been validated in all clinical situations.     eGFR's persistently <90 mL/min signify possible Chronic Kidney     Disease.  MRSA PCR SCREENING     Status: None   Collection Time    05/30/13  4:18 PM      Result Value Range   MRSA by PCR NEGATIVE  NEGATIVE   Comment:            The  GeneXpert MRSA Assay (FDA     approved for NASAL specimens     only), is one component of a     comprehensive MRSA colonization     surveillance program. It is not     intended to diagnose MRSA     infection nor to guide or     monitor treatment for     MRSA infections.  HEPARIN LEVEL (UNFRACTIONATED)     Status: Abnormal   Collection Time    05/30/13  6:21 PM      Result Value Range   Heparin Unfractionated <0.10 (*) 0.30 - 0.70 IU/mL   Comment:            IF HEPARIN RESULTS ARE BELOW     EXPECTED VALUES, AND PATIENT     DOSAGE HAS BEEN CONFIRMED,     SUGGEST FOLLOW UP TESTING     OF ANTITHROMBIN III LEVELS.  POTASSIUM     Status: None   Collection Time    05/30/13  6:21 PM      Result Value Range   Potassium 4.4  3.5 - 5.1 mEq/L   Comment: DELTA CHECK NOTED  TROPONIN I     Status: Abnormal   Collection Time    05/30/13  6:21 PM      Result Value Range   Troponin I 1.45 (*) <0.30 ng/mL   Comment:            Due to the release kinetics of cTnI,     a negative result within the first hours     of the onset of symptoms does not rule out     myocardial infarction  with certainty.     If myocardial infarction is still suspected,     repeat the test at appropriate intervals.     REPEATED TO VERIFY     CRITICAL VALUE NOTED.  VALUE IS CONSISTENT WITH PREVIOUSLY REPORTED AND CALLED VALUE.  TSH     Status: None   Collection Time    05/30/13  6:21 PM      Result Value Range   TSH 0.457  0.350 - 4.500 uIU/mL   Comment: Performed at [facility]  HEMOGLOBIN A1C     Status: Abnormal   Collection Time    05/30/13  6:21 PM      Result Value Range   Hemoglobin A1C 7.4 (*) <5.7 %   Comment: (NOTE)                                                                               According to the ADA Clinical Practice Recommendations for 2011, when     HbA1c is used as a screening test:      >=6.5%   Diagnostic of Diabetes Mellitus               (if abnormal result is  confirmed)     5.7-6.4%   Increased risk of developing Diabetes Mellitus     References:Diagnosis and Classification of Diabetes Mellitus,Diabetes     Care,2011,34(Suppl 1):S62-S69 and Standards of Medical Care in             Diabetes - 2011,Diabetes Care,2011,34 (Suppl 1):S11-S61.   Mean Plasma Glucose 166 (*) <117 mg/dL   Comment: Performed at [facility]  MAGNESIUM     Status: None   Collection Time    05/30/13  6:21 PM      Result Value Range   Magnesium 1.8  1.5 - 2.5 mg/dL  GLUCOSE, CAPILLARY     Status: Abnormal   Collection Time    05/30/13  9:51 PM      Result Value Range   Glucose-Capillary 246 (*) 70 - 99 mg/dL  TROPONIN I     Status: Abnormal   Collection Time    05/31/13  1:13 AM      Result Value Range   Troponin I 1.35 (*) <0.30 ng/mL   Comment:            Due to the release kinetics of cTnI,     a negative result within the first hours     of the onset of symptoms does not rule out     myocardial infarction with certainty.     If myocardial infarction is still suspected,     repeat the test at appropriate intervals.     CRITICAL VALUE NOTED.  VALUE IS CONSISTENT WITH PREVIOUSLY REPORTED AND CALLED VALUE.     REPEATED TO VERIFY  HEPARIN LEVEL (UNFRACTIONATED)     Status: Abnormal   Collection Time    05/31/13  5:00 AM      Result Value Range   Heparin Unfractionated 0.23 (*) 0.30 - 0.70 IU/mL   Comment:            IF HEPARIN RESULTS ARE BELOW     EXPECTED  VALUES, AND PATIENT     DOSAGE HAS BEEN CONFIRMED,     SUGGEST FOLLOW UP TESTING     OF ANTITHROMBIN III LEVELS.  CBC     Status: Abnormal   Collection Time    05/31/13  5:00 AM      Result Value Range   WBC 9.8  4.0 - 10.5 K/uL   RBC 3.79 (*) 4.22 - 5.81 MIL/uL   Hemoglobin 11.0 (*) 13.0 - 17.0 g/dL   HCT 16.1 (*) 09.6 - 04.5 %   MCV 92.6  78.0 - 100.0 fL   MCH 29.0  26.0 - 34.0 pg   MCHC 31.3  30.0 - 36.0 g/dL   RDW 40.9 (*) 81.1 - 91.4 %   Platelets 263  150 - 400 K/uL   Comment:  DELTA CHECK NOTED     REPEATED TO VERIFY  TROPONIN I     Status: Abnormal   Collection Time    05/31/13  5:00 AM      Result Value Range   Troponin I 0.84 (*) <0.30 ng/mL   Comment:            Due to the release kinetics of cTnI,     a negative result within the first hours     of the onset of symptoms does not rule out     myocardial infarction with certainty.     If myocardial infarction is still suspected,     repeat the test at appropriate intervals.     CRITICAL VALUE NOTED.  VALUE IS CONSISTENT WITH PREVIOUSLY REPORTED AND CALLED VALUE.     REPEATED TO VERIFY  BASIC METABOLIC PANEL     Status: Abnormal   Collection Time    05/31/13  5:00 AM      Result Value Range   Sodium 140  135 - 145 mEq/L   Potassium 4.0  3.5 - 5.1 mEq/L   Chloride 106  96 - 112 mEq/L   CO2 25  19 - 32 mEq/L   Glucose, Bld 210 (*) 70 - 99 mg/dL   BUN 19  6 - 23 mg/dL   Creatinine, Ser 7.82  0.50 - 1.35 mg/dL   Calcium 8.4  8.4 - 95.6 mg/dL   GFR calc non Af Amer 62 (*) >90 mL/min   GFR calc Af Amer 71 (*) >90 mL/min   Comment: (NOTE)     The eGFR has been calculated using the CKD EPI equation.     This calculation has not been validated in all clinical situations.     eGFR's persistently <90 mL/min signify possible Chronic Kidney     Disease.  GLUCOSE, CAPILLARY     Status: Abnormal   Collection Time    05/31/13  7:49 AM      Result Value Range   Glucose-Capillary 172 (*) 70 - 99 mg/dL  GLUCOSE, CAPILLARY     Status: Abnormal   Collection Time    05/31/13 12:47 PM      Result Value Range   Glucose-Capillary 141 (*) 70 - 99 mg/dL   Comment 1 Notify RN    GLUCOSE, CAPILLARY     Status: Abnormal   Collection Time    05/31/13  4:50 PM      Result Value Range   Glucose-Capillary 135 (*) 70 - 99 mg/dL   Comment 1 Notify RN    GLUCOSE, CAPILLARY     Status: Abnormal   Collection Time    05/31/13  9:23  PM      Result Value Range   Glucose-Capillary 138 (*) 70 - 99 mg/dL   Comment 1 Notify  RN    CBC     Status: Abnormal   Collection Time    06/01/13  4:40 AM      Result Value Range   WBC 12.0 (*) 4.0 - 10.5 K/uL   RBC 4.03 (*) 4.22 - 5.81 MIL/uL   Hemoglobin 11.6 (*) 13.0 - 17.0 g/dL   HCT 47.8 (*) 29.5 - 62.1 %   MCV 93.1  78.0 - 100.0 fL   MCH 28.8  26.0 - 34.0 pg   MCHC 30.9  30.0 - 36.0 g/dL   RDW 30.8 (*) 65.7 - 84.6 %   Platelets 289  150 - 400 K/uL  BASIC METABOLIC PANEL     Status: Abnormal   Collection Time    06/01/13  4:40 AM      Result Value Range   Sodium 140  135 - 145 mEq/L   Potassium 3.9  3.5 - 5.1 mEq/L   Chloride 103  96 - 112 mEq/L   CO2 25  19 - 32 mEq/L   Glucose, Bld 134 (*) 70 - 99 mg/dL   BUN 24 (*) 6 - 23 mg/dL   Creatinine, Ser 9.62 (*) 0.50 - 1.35 mg/dL   Calcium 8.6  8.4 - 95.2 mg/dL   GFR calc non Af Amer 47 (*) >90 mL/min   GFR calc Af Amer 54 (*) >90 mL/min   Comment: (NOTE)     The eGFR has been calculated using the CKD EPI equation.     This calculation has not been validated in all clinical situations.     eGFR's persistently <90 mL/min signify possible Chronic Kidney     Disease.  PRO B NATRIURETIC PEPTIDE     Status: Abnormal   Collection Time    06/01/13  4:40 AM      Result Value Range   Pro B Natriuretic peptide (BNP) 3738.0 (*) 0 - 125 pg/mL    Studies/Results: Ct Head Wo Contrast  05/30/2013   CLINICAL DATA:  Decreased mental status. Unresponsive patient.  EXAM: CT HEAD WITHOUT CONTRAST  TECHNIQUE: Contiguous axial images were obtained from the base of the skull through the vertex without intravenous contrast.  COMPARISON:  10/04/2011  FINDINGS: The cerebellum, brainstem, cerebral peduncle, ventricular system, and basilar cisterns appear unremarkable. 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 Cerebral atrophy. No intracranial hemorrhage, mass lesion, or acute CVA. Chronic right maxillary sinusitis observed. Dense atherosclerotic  calcification of the carotid siphons. Prior right mastoidectomy.  IMPRESSION: 1. No acute intracranial findings to explain the patient's decreased mental status. 2. 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 3. Cerebral atrophy. 4. Chronic right maxillary sinusitis. 5. Atherosclerosis.   Electronically Signed   By: Herbie Baltimore M.D.   On: 05/30/2013 16:35   Medications: Scheduled Meds: . allopurinol  300 mg Oral q morning - 10a  . ALPRAZolam  0.5 mg Oral Custom  . ALPRAZolam  2 mg Oral QHS  . aspirin EC  81 mg Oral Daily  . atenolol  50 mg Oral BID  . brimonidine  1 drop Both Eyes QHS   And  . timolol  1 drop Both Eyes QHS  . clopidogrel  75 mg Oral Q breakfast  . enoxaparin (LOVENOX) injection  40 mg Subcutaneous Daily  .  feeding supplement (GLUCERNA SHAKE)  237 mL Oral BID BM  . feeding supplement (PRO-STAT SUGAR FREE 64)  30 mL Oral BID WC  . furosemide  80 mg Intravenous BID  . influenza vac split quadrivalent PF  0.5 mL Intramuscular Tomorrow-1000  . insulin aspart  0-5 Units Subcutaneous QHS  . insulin aspart  0-9 Units Subcutaneous TID WC  . isosorbide mononitrate  30 mg Oral Daily  . pantoprazole  40 mg Oral Daily  . potassium chloride  20 mEq Oral BID  . simvastatin  20 mg Oral QPM  . sodium chloride  3 mL Intravenous Q12H  . temazepam  30 mg Oral QHS  . vancomycin  1,250 mg Intravenous Q12H   Continuous Infusions:  PRN Meds:.albuterol, guaiFENesin-dextromethorphan, HYDROcodone-acetaminophen, morphine injection, nitroGLYCERIN, ondansetron (ZOFRAN) IV, ondansetron, polyethylene glycol  Assessment/Plan:  Right Pleural Effusion - small RLL effusion, likely component of breast shadow noted on CXR >> minimal on bedside u/s 10/26 - should resolve with diuresis  Acute Hypercarbic Respiratory Failure  Former Tobacco Abuse  PLAN: Continue diuresis and O2 - improved.  Refusing BiPAP at night NSTEMI - troponin peak 1.45 - no chest pain Decompensated  CHF - improved HTN - controlled CAD - small bump in troponin PLAN:  Continue IV Lasix, subcutaneous heparin, and monitoring basic metabolic profile.  LLE Cellulitis - DVT negative.  PLAN:  -vancomycin per primary - consider switch over to oral antibiotic in a.m. Hyperkalemia - resolved PLAN: Resume low-dose potassium with current diuresis   Deconditioning  PLAN:  -PT  Type 2 diabetes mellitus  PLAN:  Continue sliding scale insulin and diabetic diet  DNR  PLAN: Aware  BPH  PLAN: Resume Flomax ACTIVITY: Out of bed to chair     LOS: 2 days   Pearla Dubonnet, MD 06/01/2013, 7:58 AM

## 2013-06-01 NOTE — Progress Notes (Signed)
Patient refused cpap tonight. Patient says he wants to wear his [place] tonight. RT will continue to monitor.

## 2013-06-01 NOTE — Progress Notes (Signed)
Subjective:  Patient feels better at the present time. Not SOB.  Refused BIPAP last night.    Objective:  Vital Signs in the last 24 hours: BP 119/47  Pulse 57  Temp(Src) 98.1 F (36.7 C) (Axillary)  Resp 22  Ht 5\' 8"  (1.727 m)  Wt 130 kg (286 lb 9.6 oz)  BMI 43.59 kg/m2  SpO2 94%  Physical Exam: Obese very large [place] American male who is currently in no acute distress Lungs:  Clear Cardiac:  Regular rhythm, normal S1 and S2, no S3 Abdomen:  Very large Extremities:  3+ edema noted  Intake/Output from previous day: 10/27 0701 - 10/28 0700 In: 3 [I.V.:3] Out: 3100 [Urine:3100]  Weight Filed Weights   05/30/13 1251 05/31/13 0353 06/01/13 0416  Weight: 133.131 kg (293 lb 8 oz) 133.2 kg (293 lb 10.4 oz) 130 kg (286 lb 9.6 oz)    Lab Results: Basic Metabolic Panel:  Recent Labs  14/78/29 0500 06/01/13 0440  NA 140 140  K 4.0 3.9  CL 106 103  CO2 25 25  GLUCOSE 210* 134*  BUN 19 24*  CREATININE 1.16 1.45*   CBC:  Recent Labs  05/30/13 0623 05/31/13 0500 06/01/13 0440  WBC 13.2* 9.8 12.0*  NEUTROABS 9.0*  --   --   HGB 12.5* 11.0* 11.6*  HCT 39.5 35.1* 37.5*  MCV 93.6 92.6 93.1  PLT 347 263 289   Cardiac Enzymes:  Recent Labs  05/30/13 1821 05/31/13 0113 05/31/13 0500  TROPONINI 1.45* 1.35* 0.84*    Telemetry: Currently in sinus rhythm  Assessment/Plan: 1. Acute on chronic systolic and diastolic heart failure likely worse because of failure to take diuretics 2. Acute on chronic respiratory failure likely to obesity hypoventilation syndrome 3. Non-ST elevation microinfarction likely demand ischemia-he is not a candidate for further cardiac ischemia testing or intervention 4. Peripheral vascular disease 5. Diabetes mellitus 6. Worsening renal insufficiency  Recommendations:  Cut back IV Lasix today and watch renal function.  Weight is down 7 pounds at present.  May change to po soon.  He needs to get out of bed and sit in upright  position.   Darden Palmer  MD [facility]  06/01/2013, 8:25 AM

## 2013-06-01 NOTE — Progress Notes (Signed)
Patient transferred from [facility] to 6E.  Oriented to unit and room.  Educated on fall precautions.  Unable to place red non-slip socks on patient due to 4+ edema in lower extremities.  Patient and family verbalized understanding of fall precautions, use of call bell and remote, and infection precautions.  Obtained vital signs, assessed patient.  Patient resting comfortably in chair.  Vitals and assessment documented in [employer].

## 2013-06-02 LAB — BASIC METABOLIC PANEL
Chloride: 101 mEq/L (ref 96–112)
Creatinine, Ser: 1.19 mg/dL (ref 0.50–1.35)
GFR calc Af Amer: 69 mL/min — ABNORMAL LOW (ref >90)
Potassium: 3.2 mEq/L — ABNORMAL LOW (ref 3.5–5.1)
Sodium: 142 mEq/L (ref 135–145)

## 2013-06-02 LAB — CBC
HCT: 35.1 % — ABNORMAL LOW (ref 39.0–52.0)
MCHC: 30.8 g/dL (ref 30.0–36.0)
Platelets: 240 10*3/uL (ref 150–400)
RDW: 15.7 % — ABNORMAL HIGH (ref 11.5–15.5)
WBC: 7.4 10*3/uL (ref 4.0–10.5)

## 2013-06-02 LAB — GLUCOSE, CAPILLARY: Glucose-Capillary: 119 mg/dL — ABNORMAL HIGH (ref 70–99)

## 2013-06-02 LAB — PRO B NATRIURETIC PEPTIDE: Pro B Natriuretic peptide (BNP): 2280 pg/mL — ABNORMAL HIGH (ref 0–125)

## 2013-06-02 MED ORDER — POTASSIUM CHLORIDE CRYS ER 20 MEQ PO TBCR
30.0000 meq | EXTENDED_RELEASE_TABLET | Freq: Two times a day (BID) | ORAL | Status: DC
  Administered 2013-06-02 – 2013-06-03 (×3): 30 meq via ORAL
  Filled 2013-06-02 (×4): qty 1

## 2013-06-02 MED ORDER — FUROSEMIDE 80 MG PO TABS
80.0000 mg | ORAL_TABLET | Freq: Two times a day (BID) | ORAL | Status: DC
  Administered 2013-06-02 – 2013-06-03 (×3): 80 mg via ORAL
  Filled 2013-06-02 (×5): qty 1

## 2013-06-02 NOTE — Progress Notes (Signed)
Subjective:  [name] continues to improve.  Will switch over to oral Lasix today and increase potassium slightly.  This whole problem was probably caused by his simply stopping his Lasix at home.  Objective: Weight change: -0.045 kg (-1.6 oz)  Intake/Output Summary (Last 24 hours) at 06/02/13 0814 Last data filed at 06/02/13 0809  Gross per 24 hour  Intake    610 ml  Output   1150 ml  Net   -540 ml   Filed Vitals:   06/01/13 1300 06/01/13 1800 06/01/13 2049 06/02/13 0621  BP: 152/67 136/59 134/46 124/104  Pulse: 58 56 84 55  Temp: 98.4 F (36.9 C) 98.3 F (36.8 C) 97.3 F (36.3 C) 98.4 F (36.9 C)  TempSrc: Oral Oral Oral Oral  Resp: 20 20 20 20   Height:   5\' 8"  (1.727 m)   Weight:   129.956 kg (286 lb 8 oz)   SpO2: 96% 97% 93% 97%    General Appearance: Alert, cooperative, no distress, appears stated age  Lungs: distant breath sounds with rales in bases  Heart: Regular rate and rhythm, S1 and S2 normal, no murmur, rub or gallop  Abdomen: Soft, non-tender, bowel sounds active all four quadrants, no masses, no organomegaly  Extremities: 2-3+ edema bilaterally below the knees with 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  Neuro: oriented x3, nonfocal  Lab Results: Results for orders placed during the hospital encounter of 05/30/13 (from the past 48 hour(s))  GLUCOSE, CAPILLARY     Status: Abnormal   Collection Time    05/31/13 12:47 PM      Result Value Range   Glucose-Capillary 141 (*) 70 - 99 mg/dL   Comment 1 Notify RN    GLUCOSE, CAPILLARY     Status: Abnormal   Collection Time    05/31/13  4:50 PM      Result Value Range   Glucose-Capillary 135 (*) 70 - 99 mg/dL   Comment 1 Notify RN    GLUCOSE, CAPILLARY     Status: Abnormal   Collection Time    05/31/13  9:23 PM      Result Value Range   Glucose-Capillary 138 (*) 70 - 99 mg/dL   Comment 1 Notify RN    CBC     Status: Abnormal   Collection Time    06/01/13  4:40 AM      Result Value Range   WBC 12.0 (*) 4.0 - 10.5 K/uL   RBC 4.03 (*) 4.22 - 5.81 MIL/uL   Hemoglobin 11.6 (*) 13.0 - 17.0 g/dL   HCT 16.1 (*) 09.6 - 04.5 %   MCV 93.1  78.0 - 100.0 fL   MCH 28.8  26.0 - 34.0 pg   MCHC 30.9  30.0 - 36.0 g/dL   RDW 40.9 (*) 81.1 - 91.4 %   Platelets 289  150 - 400 K/uL  BASIC METABOLIC PANEL     Status: Abnormal   Collection Time    06/01/13  4:40 AM      Result Value Range   Sodium 140  135 - 145 mEq/L   Potassium 3.9  3.5 - 5.1 mEq/L   Chloride 103  96 - 112 mEq/L   CO2 25  19 - 32 mEq/L   Glucose, Bld 134 (*) 70 - 99 mg/dL   BUN 24 (*) 6 - 23 mg/dL   Creatinine, Ser 7.82 (*) 0.50 - 1.35 mg/dL   Calcium 8.6  8.4 - 95.6 mg/dL  GFR calc non Af Amer 47 (*) >90 mL/min   GFR calc Af Amer 54 (*) >90 mL/min   Comment: (NOTE)     The eGFR has been calculated using the CKD EPI equation.     This calculation has not been validated in all clinical situations.     eGFR's persistently <90 mL/min signify possible Chronic Kidney     Disease.  PRO B NATRIURETIC PEPTIDE     Status: Abnormal   Collection Time    06/01/13  4:40 AM      Result Value Range   Pro B Natriuretic peptide (BNP) 3738.0 (*) 0 - 125 pg/mL  GLUCOSE, CAPILLARY     Status: Abnormal   Collection Time    06/01/13  7:58 AM      Result Value Range   Glucose-Capillary 152 (*) 70 - 99 mg/dL  GLUCOSE, CAPILLARY     Status: Abnormal   Collection Time    06/01/13 11:49 AM      Result Value Range   Glucose-Capillary 165 (*) 70 - 99 mg/dL  GLUCOSE, CAPILLARY     Status: Abnormal   Collection Time    06/01/13  4:44 PM      Result Value Range   Glucose-Capillary 143 (*) 70 - 99 mg/dL   Comment 1 Notify RN    GLUCOSE, CAPILLARY     Status: Abnormal   Collection Time    06/01/13  8:51 PM      Result Value Range   Glucose-Capillary 113 (*) 70 - 99 mg/dL   Comment 1 Documented in Chart     Comment 2 Notify RN    CBC     Status: Abnormal   Collection Time    06/02/13  5:34 AM      Result Value Range   WBC 7.4   4.0 - 10.5 K/uL   RBC 3.80 (*) 4.22 - 5.81 MIL/uL   Hemoglobin 10.8 (*) 13.0 - 17.0 g/dL   HCT 54.0 (*) 98.1 - 19.1 %   MCV 92.4  78.0 - 100.0 fL   MCH 28.4  26.0 - 34.0 pg   MCHC 30.8  30.0 - 36.0 g/dL   RDW 47.8 (*) 29.5 - 62.1 %   Platelets 240  150 - 400 K/uL  BASIC METABOLIC PANEL     Status: Abnormal   Collection Time    06/02/13  5:34 AM      Result Value Range   Sodium 142  135 - 145 mEq/L   Potassium 3.2 (*) 3.5 - 5.1 mEq/L   Comment: DELTA CHECK NOTED   Chloride 101  96 - 112 mEq/L   CO2 34 (*) 19 - 32 mEq/L   Glucose, Bld 147 (*) 70 - 99 mg/dL   BUN 25 (*) 6 - 23 mg/dL   Creatinine, Ser 3.08  0.50 - 1.35 mg/dL   Calcium 8.7  8.4 - 65.7 mg/dL   GFR calc non Af Amer 60 (*) >90 mL/min   GFR calc Af Amer 69 (*) >90 mL/min   Comment: (NOTE)     The eGFR has been calculated using the CKD EPI equation.     This calculation has not been validated in all clinical situations.     eGFR's persistently <90 mL/min signify possible Chronic Kidney     Disease.  PRO B NATRIURETIC PEPTIDE     Status: Abnormal   Collection Time    06/02/13  5:34 AM      Result Value  Range   Pro B Natriuretic peptide (BNP) 2280.0 (*) 0 - 125 pg/mL  GLUCOSE, CAPILLARY     Status: Abnormal   Collection Time    06/02/13  7:32 AM      Result Value Range   Glucose-Capillary 144 (*) 70 - 99 mg/dL    Studies/Results: No results found. Medications: Scheduled Meds: . allopurinol  300 mg Oral q morning - 10a  . ALPRAZolam  0.5 mg Oral Custom  . ALPRAZolam  2 mg Oral QHS  . aspirin EC  81 mg Oral Daily  . atenolol  50 mg Oral BID  . brimonidine  1 drop Both Eyes QHS   And  . timolol  1 drop Both Eyes QHS  . clopidogrel  75 mg Oral Q breakfast  . enoxaparin (LOVENOX) injection  40 mg Subcutaneous Daily  . feeding supplement (GLUCERNA SHAKE)  237 mL Oral BID BM  . feeding supplement (PRO-STAT SUGAR FREE 64)  30 mL Oral BID WC  . furosemide  80 mg Intravenous BID  . influenza vac split quadrivalent  PF  0.5 mL Intramuscular Tomorrow-1000  . insulin aspart  0-20 Units Subcutaneous TID WC  . insulin glargine  10 Units Subcutaneous QHS  . isosorbide mononitrate  30 mg Oral Daily  . pantoprazole  40 mg Oral Daily  . perphenazine  8 mg Oral QHS  . potassium chloride  20 mEq Oral BID  . simvastatin  20 mg Oral QPM  . sodium chloride  3 mL Intravenous Q12H  . tamsulosin  0.4 mg Oral QHS,MR X 1  . temazepam  30 mg Oral QHS  . vancomycin  1,250 mg Intravenous Q12H   Continuous Infusions:  PRN Meds:.albuterol, guaiFENesin-dextromethorphan, HYDROcodone-acetaminophen, morphine injection, nitroGLYCERIN, ondansetron (ZOFRAN) IV, ondansetron, polyethylene glycol  Assessment/Plan:    Right Pleural Effusion - small RLL effusion, likely component of breast shadow noted on CXR >> minimal on bedside u/s 10/26 - should resolve with diuresis  Acute Hypercarbic Respiratory Failure  Former Tobacco Abuse  PLAN: Continue diuresis and O2 - improved. Refusing BiPAP at night  NSTEMI - troponin peak 1.45 - no chest pain  Decompensated CHF - improved  HTN - controlled  CAD - small bump in troponin  PLAN:  Continue IV Lasix, subcutaneous heparin, and monitoring basic metabolic profile.  LLE Cellulitis - DVT negative.  PLAN:  -vancomycin per primary - consider switch over to oral antibiotic in a.m.  Hyperkalemia - resolved  PLAN: Resume low-dose potassium with current diuresis  Deconditioning  PLAN:  -PT  Type 2 diabetes mellitus  PLAN:  Continue sliding scale insulin and diabetic diet  DNR  PLAN: Aware  BPH  PLAN: Resume Flomax  ACTIVITY: Out of bed to chair  Morbid obesity - chronic Disposition - if tolerates oral diuretics today and electrolytes stable, we'll plan for discharge tomorrow.  Will need home O2   LOS: 3 days   Pearla Dubonnet, MD 06/02/2013, 8:14 AM

## 2013-06-02 NOTE — Progress Notes (Signed)
Physical Therapy Treatment Patient Details Name: Nicholas [name] MRN: [id number] DOB: [birth year] Today's Date: 06/02/2013 Time: 4098-1191 PT Time Calculation (min): 32 min  PT Assessment / Plan / Recommendation  History of Present Illness 72 y/o M with a complex medical history admitted with progressive SOB, LE swelling with presumed CHF exacerbation.  Found to have R pleural effusion    PT Comments   Pt agreeable to therapy. Pt able to increase ambulation distance today with min guard (A). Pt participated in theraex for general LE strengthening. Family present and stated pt would have 24/7 (A) when he returns home. Pt and family uncertain if they are agreeable to HHPT; encouraged to consider having PT to address overall deconditioning of pt. Will cont to f/u with pt per POC.   Follow Up Recommendations  Home health PT;Supervision/Assistance - 24 hour     Does the patient have the potential to tolerate intense rehabilitation     Barriers to Discharge        Equipment Recommendations  None recommended by PT    Recommendations for Other Services    Frequency Min 3X/week   Progress towards PT Goals Progress towards PT goals: Progressing toward goals  Plan Current plan remains appropriate    Precautions / Restrictions Precautions Precautions: Fall Precaution Comments: wound on left heel Restrictions Weight Bearing Restrictions: No   Pertinent Vitals/Pain 5/10 Lt LE > Rt LE    Mobility  Bed Mobility Bed Mobility: Not assessed Transfers Transfers: Sit to Stand;Stand to Sit Sit to Stand: 4: Min guard;From bed;With upper extremity assist Stand to Sit: 4: Min guard;To chair/3-in-1;With armrests Details for Transfer Assistance: min guard to steady and for safety; pt requires incr time to complete transfers; cues for hand placement and management of RW Ambulation/Gait Ambulation/Gait Assistance: 4: Min guard Ambulation Distance (Feet): 18 Feet Assistive device: Rolling  walker Ambulation/Gait Assistance Details: pt reports he amb on RA at home and uses O2 only when lying down; cues for upright posture and gt sequencing; pt conversing while amb; no SOB noted when walking  Gait Pattern: Wide base of support;Shuffle;Trunk flexed Gait velocity: decreased Stairs: No Wheelchair Mobility Wheelchair Mobility: No    Exercises General Exercises - Lower Extremity Ankle Circles/Pumps: AROM;Both;10 reps;Seated Long Arc Quad: AROM;Both;10 reps;Seated Hip ABduction/ADduction: AAROM;Both;10 reps;Seated   PT Diagnosis:    PT Problem List:   PT Treatment Interventions:     PT Goals (current goals can now be found in the care plan section) Acute Rehab PT Goals Patient Stated Goal: walk to and from my bathroom and watch tv PT Goal Formulation: With patient Time For Goal Achievement: 06/01/13 Potential to Achieve Goals: Fair  Visit Information  Last PT Received On: 06/02/13 Assistance Needed: +1 History of Present Illness: 72 y/o M with a complex medical history admitted with progressive SOB, LE swelling with presumed CHF exacerbation.  Found to have R pleural effusion     Subjective Data  Subjective: pt sitting EOB with family present; agreeable to therapy. states " i dont really need to do too much. i mostly watch tv and walk to my bedroom and bathroom only Patient Stated Goal: walk to and from my bathroom and watch tv   Cognition  Cognition Arousal/Alertness: Awake/alert Behavior During Therapy: WFL for tasks assessed/performed Overall Cognitive Status: Within Functional Limits for tasks assessed    Balance  Balance Balance Assessed: Yes Static Sitting Balance Static Sitting - Balance Support: Bilateral upper extremity supported;Feet supported Static Sitting - Level of Assistance:  5: Stand by assistance Static Sitting - Comment/# of Minutes: pt tolerated sitting EOB ~5 min to prepare for gt  End of Session PT - End of Session Equipment Utilized During  Treatment: Gait belt;Oxygen Activity Tolerance: Patient tolerated treatment well Patient left: in chair;with call bell/phone within reach;with family/visitor present Nurse Communication: Mobility status   GP     Donell Sievert, [place] [phone] 06/02/2013, 2:54 PM

## 2013-06-03 DIAGNOSIS — L8992 Pressure ulcer of unspecified site, stage 2: Secondary | ICD-10-CM | POA: Diagnosis present

## 2013-06-03 DIAGNOSIS — E1121 Type 2 diabetes mellitus with diabetic nephropathy: Secondary | ICD-10-CM | POA: Diagnosis present

## 2013-06-03 LAB — PRO B NATRIURETIC PEPTIDE: Pro B Natriuretic peptide (BNP): 1275 pg/mL — ABNORMAL HIGH (ref 0–125)

## 2013-06-03 LAB — BASIC METABOLIC PANEL
CO2: 32 mEq/L (ref 19–32)
Calcium: 8.6 mg/dL (ref 8.4–10.5)
Creatinine, Ser: 1.08 mg/dL (ref 0.50–1.35)
Glucose, Bld: 143 mg/dL — ABNORMAL HIGH (ref 70–99)
Potassium: 3.3 mEq/L — ABNORMAL LOW (ref 3.5–5.1)

## 2013-06-03 LAB — GLUCOSE, CAPILLARY: Glucose-Capillary: 165 mg/dL — ABNORMAL HIGH (ref 70–99)

## 2013-06-03 MED ORDER — ASPIRIN 81 MG PO TBEC: 81.0000 mg | DELAYED_RELEASE_TABLET | Freq: Every day | ORAL | Status: DC

## 2013-06-03 MED ORDER — POTASSIUM CHLORIDE CRYS ER 20 MEQ PO TBCR: 40.0000 meq | EXTENDED_RELEASE_TABLET | Freq: Two times a day (BID) | ORAL | Status: DC

## 2013-06-03 MED ORDER — FUROSEMIDE 80 MG PO TABS: 80.0000 mg | ORAL_TABLET | Freq: Every morning | ORAL | Status: DC

## 2013-06-03 MED ORDER — GLIPIZIDE 5 MG PO TABS: 5.0000 mg | ORAL_TABLET | Freq: Two times a day (BID) | ORAL | Status: DC

## 2013-06-03 MED ORDER — HYDROCODONE-ACETAMINOPHEN 5-325 MG PO TABS: 1.0000 | ORAL_TABLET | ORAL | Status: DC | PRN

## 2013-06-03 NOTE — Progress Notes (Signed)
Pt. Refused CPAP for tonight 06/02/13.

## 2013-06-03 NOTE — Care Management Note (Signed)
   CARE MANAGEMENT NOTE 06/03/2013  Patient:  Nicholas [name], Nicholas [name]   Account Number:  [id number]  Date Initiated:  05/31/2013  Documentation initiated by:  Junius Creamer  Subjective/Objective Assessment:   adm w resp distress, hyperkalemia     Action/Plan:   lives w wife, pcp dr Molly Maduro gates  Per H&P pt is on CPAP and home oxygne   Anticipated DC Date:  06/03/2013   Anticipated DC Plan:  HOME W HOME HEALTH SERVICES      DC Planning Services  CM consult      Choice offered to / List presented to:  C-3 Spouse        HH arranged  HH-1 RN  HH-2 PT      Status of service:  In process, will continue to follow Medicare Important Message given?   (If response is "NO", the following Medicare IM given date fields will be blank) Date Medicare IM given:   Date Additional Medicare IM given:    Discharge Disposition:    Per UR Regulation:  Reviewed for med. necessity/level of care/duration of stay  If discussed at Long Length of Stay Meetings, dates discussed:    Comments:  06/03/2013 Met with pt and wife, and pt has not has [facility] services for several months, varified with [facility] that services stopped in April 2014. Pt wife selected [facility] to restart services. [facility] notified and Dr Kevan Ny called and message left asking for orders and face to face to be completed. Johny Shock RN MPH, case manager, [phone]  06/02/2013  [address] RN, [place]  [phone] CM referral:  verify home O2 and HHPT

## 2013-06-03 NOTE — Progress Notes (Addendum)
Subjective:  SOB improved.  Not wearing BIPAP at night.    Objective:  Vital Signs in the last 24 hours: BP 143/45  Pulse 57  Temp(Src) 98.5 F (36.4 C) (Oral)  Resp 18  Ht 5\' 8"  (1.727 m)  Wt 131.8 kg (290 lb 9.1 oz)  BMI 44.19 kg/m2  SpO2 98%  Physical Exam: Obese very large [place] American male who is currently in no acute distress Lungs:  Clear Cardiac:  Regular rhythm, normal S1 and S2, no S3 Abdomen:  Very large Extremities:  2+ edema noted   Weight Filed Weights   06/01/13 0416 06/01/13 2049 06/02/13 2100  Weight: 130 kg (286 lb 9.6 oz) 129.956 kg (286 lb 8 oz) 131.8 kg (290 lb 9.1 oz)    Lab Results: Basic Metabolic Panel:  Recent Labs  84/69/62 0534  NA 142  K 3.2*  CL 101  CO2 34*  GLUCOSE 147*  BUN 25*  CREATININE 1.19   CBC:  Recent Labs  06/01/13 0440 06/02/13 0534  WBC 12.0* 7.4  HGB 11.6* 10.8*  HCT 37.5* 35.1*  MCV 93.1 92.4  PLT 289 240    Telemetry: Currently in sinus rhythm  Assessment/Plan: 1. Acute on chronic systolic and diastolic heart failure resolved 2. Acute on chronic respiratory failure likely to obesity hypoventilation syndrome 3. Non-ST elevation myocardial infarction likely demand ischemia-he is not a candidate for further cardiac ischemia testing or intervention 4. Peripheral vascular disease 5. Diabetes mellitus  Recommendations:  Change to oral Lasix hopefully d/c in am. Importance of compliance discussed with patient and family.   Darden Palmer  MD [facility]  06/02/2013, 8:17 AM

## 2013-06-03 NOTE — Discharge Summary (Signed)
Physician Discharge Summary  NAME:Nicholas [name]  ZOX:[id number]  DOB: September 28, 1940   Admit date: 05/30/2013 Discharge date: 06/03/2013  Discharge Diagnoses:  Principal Problem:   Acute respiratory failure - resolved with diuresis, secondary to CHF secondary to noncompliance with furosemide Active Problems:   Type 2 diabetes mellitus with vascular disease - blood sugar controlled   PAD (peripheral artery disease) - chronic claudication, stable   Hyperlipidemia   BPH (benign prostatic hyperplasia)   CAD (coronary artery disease), native coronary artery - patient suffered non-STEMI and CHF but symptomatically resolved   Morbid obesity -    DNR (do not resuscitate)   Chronic systolic CHF (congestive heart failure) - improved with diuresis   Type 2 diabetes mellitus with diabetic nephropathy - stable   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, stable   Discharge Physical Exam:  General Appearance: Alert, cooperative, no distress, appears stated age  Weight change: 1.844 kg (4 lb 1.1 oz)  Intake/Output Summary (Last 24 hours) at 06/03/13 0824 Last data filed at 06/02/13 1700  Gross per 24 hour  Intake    840 ml  Output    650 ml  Net    190 ml   Filed Vitals:   06/02/13 1316 06/02/13 1800 06/02/13 2100 06/03/13 0500  BP: 138/52 168/66 143/45 135/45  Pulse: 60 55 57 57  Temp: 98.2 F (36.8 C) 98.4 F (36.9 C) 98.5 F (36.9 C) 97.5 F (36.4 C)  TempSrc: Oral Oral Oral Oral  Resp: 18 18 18 18   Height:      Weight:   131.8 kg (290 lb 9.1 oz)   SpO2: 96% 98% 93% 98%    General Appearance: Alert, cooperative, no distress, appears stated age  Lungs: distant breath sounds with rales in bases  Heart: Regular rate and rhythm, S1 and S2 normal, no murmur, rub or gallop  Abdomen: Soft, non-tender, bowel sounds active all four quadrants, no masses, no organomegaly  Extremities: 2-3+ edema bilaterally below the knees with 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  Neuro:  oriented x3, nonfocal   Discharge Condition: Improved  Hospital Course:  Nicholas [name] is a pleasant 72 year old male with history of CAD, status post CABG, morbid obesity, sleep apnea with refusal to use CPAP, COPD on chronic O2 therapy, diastolic and systolic heart failure with EF of 30%.  He also has ischemic cardiomyopathy, type 2 diabetes, seizures, chronic venous insufficiency, diabetic peripheral neuropathy, Charcot joint of the right knee, and chronic lower extremity venous ulcers currently in remission.  Patient stopped taking his Lasix because of her content with urination frequency.  He presented to the emergency room with acute respiratory failure with CHF.  He was hyperkalemic because of continuing to take potassium but not his Lasix. While hospitalized she was treated aggressively with intravenous Lasix and acute respiratory failure resolved.  Continues to use nocturnal oxygen per nasal cannula but refuses CPAP.  Patient is a no CODE BLUE. Patient did have worsening of chronic lower extremity cellulitis and was treated with vancomycin while admitted with excellent resolution of lower extremity erythema. We'll plan for outpatient followup in one week  Things to follow up in the outpatient setting: Leg edema, shortness of breath, blood sugar, and kidney function  Consults: Treatment Team:  Othella Boyer, MD  Disposition: 01-Home or Self Care  Discharge Orders   Future Appointments Provider Department Dept Phone   06/17/2013 9:00 AM Ndm-Nmch Dm Core Class 1 [facility] [phone]   06/24/2013 9:00 AM Ndm-Nm[facility] Dm Core Class 2 [facility] [phone]   06/28/2013 11:00 AM Mc-Cv Us1 [facility] [address] [phone]   06/28/2013 11:40 AM Carma Lair Nickel, NP [facility] [phone]   06/30/2013 9:00 AM Ndm-Nmch Dm Core Class 3 [facility] [phone]   Future Orders Complete By Expires   Call MD for:  difficulty breathing, headache or visual disturbances  As directed    Call MD for:  persistant nausea and vomiting  As directed    Call MD for:  redness, tenderness, or signs of infection (pain, swelling, redness, odor or green/yellow discharge around incision site)  As directed    Call MD for:  severe uncontrolled pain  As directed    Call MD for:  temperature >100.4  As directed    Diet - low sodium heart healthy  As directed    Discharge instructions  As directed    Comments:     Take furosemide 80 milligrams daily and potassium chloride 40 milliequivalents twice daily and increase furosemide to 80 milligrams twice daily if lower extremities swelling starts to recur.  Always elevate legs to horizontal level when sitting   Increase activity slowly  As directed        Medication List    STOP taking these medications       HYDROcodone-acetaminophen 7.5-750 MG per tablet  Commonly known as:  VICODIN ES  Replaced by:  HYDROcodone-acetaminophen 5-325 MG per tablet      TAKE these medications       allopurinol 300 MG tablet  Commonly known as:  ZYLOPRIM  Take 300 mg by mouth every morning.     ALPRAZolam 1 MG tablet  Commonly known as:  XANAX  Take 0.5-2 mg by mouth 3 (three) times daily. Takes half tablet in the morning and at 430 pm and 2 tablets at bedtime.     aspirin 81 MG EC tablet  Take 1 tablet (81 mg total) by mouth daily.     atenolol 50 MG tablet  Commonly known as:  TENORMIN  Take 50 mg by mouth 2 (two) times daily.     brimonidine-timolol 0.2-0.5 % ophthalmic solution  Commonly known as:  COMBIGAN  Place 1 drop into both eyes at bedtime.     clopidogrel 75 MG tablet  Commonly known as:  PLAVIX  Take 75 mg by mouth every morning.     diclofenac sodium 1 % Gel  Commonly known as:  VOLTAREN  Apply 1 application topically 4 (four) times daily as needed.  Applies to both knees as needed for pain     furosemide 80 MG tablet  Commonly known as:  LASIX  Take 1 tablet (80 mg total) by mouth every morning.     glipiZIDE 5 MG tablet  Commonly known as:  GLUCOTROL  Take 1 tablet (5 mg total) by mouth 2 (two) times daily before a meal.     HYDROcodone-acetaminophen 5-325 MG per tablet  Commonly known as:  NORCO/VICODIN  Take 1 tablet by mouth every 4 (four) hours as needed.     insulin glargine 100 UNIT/ML injection  Commonly known as:  LANTUS  - Inject 5-10 Units into the skin 2 (two) times daily as needed. Per sliding scale in divided doses.  - May use up to 140 units/day.  - Usual dose is 5-10 units  isosorbide mononitrate 30 MG 24 hr tablet  Commonly known as:  IMDUR  Take 30 mg by mouth every morning.     latanoprost 0.005 % ophthalmic solution  Commonly known as:  XALATAN  Place 1 drop into both eyes at bedtime.     nitroGLYCERIN 0.4 MG SL tablet  Commonly known as:  NITROSTAT  Place 0.4 mg under the tongue every 5 (five) minutes as needed. For chest pain     omeprazole 20 MG capsule  Commonly known as:  PRILOSEC  Take 20 mg by mouth 2 (two) times daily.     perphenazine 8 MG tablet  Commonly known as:  TRILAFON  Take 8 mg by mouth 2 (two) times daily as needed (nausea/vomiting).     potassium chloride SA 20 MEQ tablet  Commonly known as:  K-DUR,KLOR-CON  Take 2 tablets (40 mEq total) by mouth 2 (two) times daily.     simvastatin 20 MG tablet  Commonly known as:  ZOCOR  Take 20 mg by mouth every evening.     tamsulosin 0.4 MG Caps capsule  Commonly known as:  FLOMAX  Take 0.4 mg by mouth at bedtime and may repeat dose one time if needed.     temazepam 30 MG capsule  Commonly known as:  RESTORIL  Take 30 mg by mouth at bedtime.         The results of significant diagnostics from this hospitalization (including imaging, microbiology, ancillary and laboratory) are listed below for reference.    Significant  Diagnostic Studies: Ct Head Wo Contrast  05/30/2013   CLINICAL DATA:  Decreased mental status. Unresponsive patient.  EXAM: CT HEAD WITHOUT CONTRAST  TECHNIQUE: Contiguous axial images were obtained from the base of the skull through the vertex without intravenous contrast.  COMPARISON:  10/04/2011  FINDINGS: The cerebellum, brainstem, cerebral peduncle, ventricular system, and basilar cisterns appear unremarkable. 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 Cerebral atrophy. No intracranial hemorrhage, mass lesion, or acute CVA. Chronic right maxillary sinusitis observed. Dense atherosclerotic calcification of the carotid siphons. Prior right mastoidectomy.  IMPRESSION: 1. No acute intracranial findings to explain the patient's decreased mental status. 2. 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 3. Cerebral atrophy. 4. Chronic right maxillary sinusitis. 5. Atherosclerosis.   Electronically Signed   By: Herbie Baltimore M.D.   On: 05/30/2013 16:35   Dg Chest Port 1 View  06/02/2013   CLINICAL DATA:  Followup congestive heart failure  EXAM: PORTABLE CHEST - 1 VIEW  COMPARISON:  None.  FINDINGS: Marked enlargement of the cardiopericardial silhouette. Patient is status post median sternotomy with evidence of multivessel CABG. There is pulmonary vascular congestion with mild interstitial pulmonary edema. Probable small right pleural effusion and associated basilar atelectasis. Is no acute osseous abnormality.  IMPRESSION: Mild-moderate CHF.  Enlargement of the cardiopericardial silhouette may reflect cardiomegaly and/ or pericardial effusion. Cardiomegaly is favored.  Small right pleural effusion and associated basilar atelectasis.   Electronically Signed   By: Malachy Moan M.D.   On: 06/02/2013 09:29   Dg Chest Portable 1 View  05/30/2013   CLINICAL DATA:  Shortness of Breath  EXAM: PORTABLE CHEST -  1 VIEW  COMPARISON:  Dec 11, 2011  FINDINGS: There is loculated effusion on the right. There is cardiomegaly with mild interstitial edema. There is underlying emphysema. There is no airspace consolidation. No adenopathy. Patient is status post coronary  artery bypass grafting.  IMPRESSION: The congestive heart failure superimposed on emphysema. Note that there is loculated effusion on the right.   Electronically Signed   By: Bretta Bang M.D.   On: 05/30/2013 07:04    Microbiology: Recent Results (from the past 240 hour(s))  CULTURE, BLOOD (ROUTINE X 2)     Status: None   Collection Time    05/30/13  6:20 AM      Result Value Range Status   Specimen Description BLOOD LEFT ARM   Final   Special Requests NONE   Final   Culture  Setup Time     Final   Value: 05/30/2013 14:05     Performed at [facility]   Culture     Final   Value:        BLOOD CULTURE RECEIVED NO GROWTH TO DATE CULTURE WILL BE HELD FOR 5 DAYS BEFORE ISSUING A FINAL NEGATIVE REPORT     Performed at [facility]   Report Status PENDING   Incomplete  CULTURE, BLOOD (ROUTINE X 2)     Status: None   Collection Time    05/30/13  8:00 AM      Result Value Range Status   Specimen Description BLOOD RIGHT ARM   Final   Special Requests BOTTLES DRAWN AEROBIC AND ANAEROBIC 10CC   Final   Culture  Setup Time     Final   Value: 05/30/2013 14:05     Performed at [facility]   Culture     Final   Value:        BLOOD CULTURE RECEIVED NO GROWTH TO DATE CULTURE WILL BE HELD FOR 5 DAYS BEFORE ISSUING A FINAL NEGATIVE REPORT     Performed at [facility]   Report Status PENDING   Incomplete  MRSA PCR SCREENING     Status: None   Collection Time    05/30/13  4:18 PM      Result Value Range Status   MRSA by PCR NEGATIVE  NEGATIVE Final   Comment:            The GeneXpert MRSA Assay (FDA     approved for NASAL specimens     only), is one component of a     comprehensive MRSA colonization      surveillance program. It is not     intended to diagnose MRSA     infection nor to guide or     monitor treatment for     MRSA infections.     Labs: Results for orders placed during the hospital encounter of 05/30/13  CULTURE, BLOOD (ROUTINE X 2)      Result Value Range   Specimen Description BLOOD RIGHT ARM     Special Requests BOTTLES DRAWN AEROBIC AND ANAEROBIC 10CC     Culture  Setup Time       Value: 05/30/2013 14:05     Performed at [facility]   Culture       Value:        BLOOD CULTURE RECEIVED NO GROWTH TO DATE CULTURE WILL BE HELD FOR 5 DAYS BEFORE ISSUING A FINAL NEGATIVE REPORT     Performed at [facility]   Report Status PENDING    CULTURE, BLOOD (ROUTINE X 2)      Result Value Range   Specimen Description BLOOD LEFT ARM     Special Requests NONE     Culture  Setup Time  Value: 05/30/2013 14:05     Performed at [facility]   Culture       Value:        BLOOD CULTURE RECEIVED NO GROWTH TO DATE CULTURE WILL BE HELD FOR 5 DAYS BEFORE ISSUING A FINAL NEGATIVE REPORT     Performed at [facility]   Report Status PENDING    MRSA PCR SCREENING      Result Value Range   MRSA by PCR NEGATIVE  NEGATIVE  CBC WITH DIFFERENTIAL      Result Value Range   WBC 13.2 (*) 4.0 - 10.5 K/uL   RBC 4.22  4.22 - 5.81 MIL/uL   Hemoglobin 12.5 (*) 13.0 - 17.0 g/dL   HCT 16.1  09.6 - 04.5 %   MCV 93.6  78.0 - 100.0 fL   MCH 29.6  26.0 - 34.0 pg   MCHC 31.6  30.0 - 36.0 g/dL   RDW 40.9 (*) 81.1 - 91.4 %   Platelets 347  150 - 400 K/uL   Neutrophils Relative % 68  43 - 77 %   Neutro Abs 9.0 (*) 1.7 - 7.7 K/uL   Lymphocytes Relative 23  12 - 46 %   Lymphs Abs 3.1  0.7 - 4.0 K/uL   Monocytes Relative 5  3 - 12 %   Monocytes Absolute 0.7  0.1 - 1.0 K/uL   Eosinophils Relative 3  0 - 5 %   Eosinophils Absolute 0.4  0.0 - 0.7 K/uL   Basophils Relative 0  0 - 1 %   Basophils Absolute 0.0  0.0 - 0.1 K/uL  COMPREHENSIVE METABOLIC PANEL       Result Value Range   Sodium 135  135 - 145 mEq/L   Potassium 7.1 (*) 3.5 - 5.1 mEq/L   Chloride 104  96 - 112 mEq/L   CO2 21  19 - 32 mEq/L   Glucose, Bld 198 (*) 70 - 99 mg/dL   BUN 18  6 - 23 mg/dL   Creatinine, Ser 7.82  0.50 - 1.35 mg/dL   Calcium 8.9  8.4 - 95.6 mg/dL   Total Protein 7.7  6.0 - 8.3 g/dL   Albumin 2.9 (*) 3.5 - 5.2 g/dL   AST 19  0 - 37 U/L   ALT 17  0 - 53 U/L   Alkaline Phosphatase 117  39 - 117 U/L   Total Bilirubin 0.2 (*) 0.3 - 1.2 mg/dL   GFR calc non Af Amer 51 (*) >90 mL/min   GFR calc Af Amer 59 (*) >90 mL/min  TROPONIN I      Result Value Range   Troponin I 1.35 (*) <0.30 ng/mL  PRO B NATRIURETIC PEPTIDE      Result Value Range   Pro B Natriuretic peptide (BNP) 2668.0 (*) 0 - 125 pg/mL  POTASSIUM      Result Value Range   Potassium 6.8 (*) 3.5 - 5.1 mEq/L  APTT      Result Value Range   aPTT 68 (*) 24 - 37 seconds  PROTIME-INR      Result Value Range   Prothrombin Time 13.8  11.6 - 15.2 seconds   INR 1.08  0.00 - 1.49  HEPARIN LEVEL (UNFRACTIONATED)      Result Value Range   Heparin Unfractionated <0.10 (*) 0.30 - 0.70 IU/mL  BASIC METABOLIC PANEL      Result Value Range   Sodium 141  135 - 145 mEq/L  Potassium 5.5 (*) 3.5 - 5.1 mEq/L   Chloride 105  96 - 112 mEq/L   CO2 26  19 - 32 mEq/L   Glucose, Bld 232 (*) 70 - 99 mg/dL   BUN 18  6 - 23 mg/dL   Creatinine, Ser 4.09  0.50 - 1.35 mg/dL   Calcium 9.3  8.4 - 81.1 mg/dL   GFR calc non Af Amer 53 (*) >90 mL/min   GFR calc Af Amer 62 (*) >90 mL/min  POTASSIUM      Result Value Range   Potassium 4.4  3.5 - 5.1 mEq/L  HEPARIN LEVEL (UNFRACTIONATED)      Result Value Range   Heparin Unfractionated 0.23 (*) 0.30 - 0.70 IU/mL  CBC      Result Value Range   WBC 9.8  4.0 - 10.5 K/uL   RBC 3.79 (*) 4.22 - 5.81 MIL/uL   Hemoglobin 11.0 (*) 13.0 - 17.0 g/dL   HCT 91.4 (*) 78.2 - 95.6 %   MCV 92.6  78.0 - 100.0 fL   MCH 29.0  26.0 - 34.0 pg   MCHC 31.3  30.0 - 36.0 g/dL   RDW 21.3 (*)  08.6 - 15.5 %   Platelets 263  150 - 400 K/uL  TROPONIN I      Result Value Range   Troponin I 1.45 (*) <0.30 ng/mL  TROPONIN I      Result Value Range   Troponin I 1.35 (*) <0.30 ng/mL  TROPONIN I      Result Value Range   Troponin I 0.84 (*) <0.30 ng/mL  BASIC METABOLIC PANEL      Result Value Range   Sodium 140  135 - 145 mEq/L   Potassium 4.0  3.5 - 5.1 mEq/L   Chloride 106  96 - 112 mEq/L   CO2 25  19 - 32 mEq/L   Glucose, Bld 210 (*) 70 - 99 mg/dL   BUN 19  6 - 23 mg/dL   Creatinine, Ser 5.78  0.50 - 1.35 mg/dL   Calcium 8.4  8.4 - 46.9 mg/dL   GFR calc non Af Amer 62 (*) >90 mL/min   GFR calc Af Amer 71 (*) >90 mL/min  TSH      Result Value Range   TSH 0.457  0.350 - 4.500 uIU/mL  HEMOGLOBIN A1C      Result Value Range   Hemoglobin A1C 7.4 (*) <5.7 %   Mean Plasma Glucose 166 (*) <117 mg/dL  MAGNESIUM      Result Value Range   Magnesium 1.8  1.5 - 2.5 mg/dL  GLUCOSE, CAPILLARY      Result Value Range   Glucose-Capillary 246 (*) 70 - 99 mg/dL  GLUCOSE, CAPILLARY      Result Value Range   Glucose-Capillary 172 (*) 70 - 99 mg/dL  GLUCOSE, CAPILLARY      Result Value Range   Glucose-Capillary 141 (*) 70 - 99 mg/dL   Comment 1 Notify RN    CBC      Result Value Range   WBC 12.0 (*) 4.0 - 10.5 K/uL   RBC 4.03 (*) 4.22 - 5.81 MIL/uL   Hemoglobin 11.6 (*) 13.0 - 17.0 g/dL   HCT 62.9 (*) 52.8 - 41.3 %   MCV 93.1  78.0 - 100.0 fL   MCH 28.8  26.0 - 34.0 pg   MCHC 30.9  30.0 - 36.0 g/dL   RDW 24.4 (*) 01.0 - 27.2 %   Platelets  289  150 - 400 K/uL  BASIC METABOLIC PANEL      Result Value Range   Sodium 140  135 - 145 mEq/L   Potassium 3.9  3.5 - 5.1 mEq/L   Chloride 103  96 - 112 mEq/L   CO2 25  19 - 32 mEq/L   Glucose, Bld 134 (*) 70 - 99 mg/dL   BUN 24 (*) 6 - 23 mg/dL   Creatinine, Ser 1.61 (*) 0.50 - 1.35 mg/dL   Calcium 8.6  8.4 - 09.6 mg/dL   GFR calc non Af Amer 47 (*) >90 mL/min   GFR calc Af Amer 54 (*) >90 mL/min  GLUCOSE, CAPILLARY      Result  Value Range   Glucose-Capillary 135 (*) 70 - 99 mg/dL   Comment 1 Notify RN    PRO B NATRIURETIC PEPTIDE      Result Value Range   Pro B Natriuretic peptide (BNP) 3738.0 (*) 0 - 125 pg/mL  GLUCOSE, CAPILLARY      Result Value Range   Glucose-Capillary 138 (*) 70 - 99 mg/dL   Comment 1 Notify RN    GLUCOSE, CAPILLARY      Result Value Range   Glucose-Capillary 152 (*) 70 - 99 mg/dL  GLUCOSE, CAPILLARY      Result Value Range   Glucose-Capillary 165 (*) 70 - 99 mg/dL  GLUCOSE, CAPILLARY      Result Value Range   Glucose-Capillary 143 (*) 70 - 99 mg/dL   Comment 1 Notify RN    CBC      Result Value Range   WBC 7.4  4.0 - 10.5 K/uL   RBC 3.80 (*) 4.22 - 5.81 MIL/uL   Hemoglobin 10.8 (*) 13.0 - 17.0 g/dL   HCT 04.5 (*) 40.9 - 81.1 %   MCV 92.4  78.0 - 100.0 fL   MCH 28.4  26.0 - 34.0 pg   MCHC 30.8  30.0 - 36.0 g/dL   RDW 91.4 (*) 78.2 - 95.6 %   Platelets 240  150 - 400 K/uL  BASIC METABOLIC PANEL      Result Value Range   Sodium 142  135 - 145 mEq/L   Potassium 3.2 (*) 3.5 - 5.1 mEq/L   Chloride 101  96 - 112 mEq/L   CO2 34 (*) 19 - 32 mEq/L   Glucose, Bld 147 (*) 70 - 99 mg/dL   BUN 25 (*) 6 - 23 mg/dL   Creatinine, Ser 2.13  0.50 - 1.35 mg/dL   Calcium 8.7  8.4 - 08.6 mg/dL   GFR calc non Af Amer 60 (*) >90 mL/min   GFR calc Af Amer 69 (*) >90 mL/min  PRO B NATRIURETIC PEPTIDE      Result Value Range   Pro B Natriuretic peptide (BNP) 2280.0 (*) 0 - 125 pg/mL  GLUCOSE, CAPILLARY      Result Value Range   Glucose-Capillary 113 (*) 70 - 99 mg/dL   Comment 1 Documented in Chart     Comment 2 Notify RN    GLUCOSE, CAPILLARY      Result Value Range   Glucose-Capillary 144 (*) 70 - 99 mg/dL  GLUCOSE, CAPILLARY      Result Value Range   Glucose-Capillary 139 (*) 70 - 99 mg/dL  GLUCOSE, CAPILLARY      Result Value Range   Glucose-Capillary 144 (*) 70 - 99 mg/dL  BASIC METABOLIC PANEL      Result Value Range   Sodium  141  135 - 145 mEq/L   Potassium 3.3 (*) 3.5 -  5.1 mEq/L   Chloride 99  96 - 112 mEq/L   CO2 32  19 - 32 mEq/L   Glucose, Bld 143 (*) 70 - 99 mg/dL   BUN 21  6 - 23 mg/dL   Creatinine, Ser 3.08  0.50 - 1.35 mg/dL   Calcium 8.6  8.4 - 65.7 mg/dL   GFR calc non Af Amer 67 (*) >90 mL/min   GFR calc Af Amer 78 (*) >90 mL/min  PRO B NATRIURETIC PEPTIDE      Result Value Range   Pro B Natriuretic peptide (BNP) 1275.0 (*) 0 - 125 pg/mL  GLUCOSE, CAPILLARY      Result Value Range   Glucose-Capillary 119 (*) 70 - 99 mg/dL  POCT I-STAT 3, BLOOD GAS (G3+)      Result Value Range   pH, Arterial 7.191 (*) 7.350 - 7.450   pCO2 arterial 62.2 (*) 35.0 - 45.0 mmHg   pO2, Arterial 288.0 (*) 80.0 - 100.0 mmHg   Bicarbonate 23.8  20.0 - 24.0 mEq/L   TCO2 26  0 - 100 mmol/L   O2 Saturation 100.0     Acid-base deficit 5.0 (*) 0.0 - 2.0 mmol/L   Patient temperature 98.6 F     Collection site RADIAL, ALLEN'S TEST ACCEPTABLE     Drawn by Operator     Sample type ARTERIAL     Comment NOTIFIED PHYSICIAN    CG4 I-STAT (LACTIC ACID)      Result Value Range   Lactic Acid, Venous 1.34  0.5 - 2.2 mmol/L  POCT I-STAT 3, BLOOD GAS (G3+)      Result Value Range   pH, Arterial 7.267 (*) 7.350 - 7.450   pCO2 arterial 51.1 (*) 35.0 - 45.0 mmHg   pO2, Arterial 141.0 (*) 80.0 - 100.0 mmHg   Bicarbonate 23.3  20.0 - 24.0 mEq/L   TCO2 25  0 - 100 mmol/L   O2 Saturation 99.0     Acid-base deficit 4.0 (*) 0.0 - 2.0 mmol/L   Patient temperature 98.6 F     Collection site RADIAL, ALLEN'S TEST ACCEPTABLE     Drawn by RT     Sample type ARTERIAL    POCT I-STAT 3, BLOOD GAS (G3+)      Result Value Range   pH, Arterial 7.303 (*) 7.350 - 7.450   pCO2 arterial 50.0 (*) 35.0 - 45.0 mmHg   pO2, Arterial 73.0 (*) 80.0 - 100.0 mmHg   Bicarbonate 24.8 (*) 20.0 - 24.0 mEq/L   TCO2 26  0 - 100 mmol/L   O2 Saturation 93.0     Acid-base deficit 2.0  0.0 - 2.0 mmol/L   Patient temperature 98.6 F     Collection site RADIAL, ALLEN'S TEST ACCEPTABLE     Drawn by Operator      Sample type ARTERIAL      Time coordinating discharge: 40 minutes  Signed: Pearla Dubonnet, MD 06/03/2013, 8:24 AM

## 2013-06-03 NOTE — Progress Notes (Signed)
Subjective:  Patient feels better at the present time. Not SOB.  Refused BIPAP last night again.  Discussed importance of medical compliance  Objective:  Vital Signs in the last 24 hours: BP 135/45  Pulse 57  Temp(Src) 97.5 F (36.4 C) (Oral)  Resp 18  Ht 5\' 8"  (1.727 m)  Wt 131.8 kg (290 lb 9.1 oz)  BMI 44.19 kg/m2  SpO2 98%  Physical Exam: Obese very large [place] American male who is currently in no acute distress Lungs:  Clear Cardiac:  Regular rhythm, normal S1 and S2, no S3 Abdomen:  Very large Extremities:  2+ edema noted  Intake/Output from previous day: 10/29 0701 - 10/30 0700 In: 840 [P.O.:840] Out: 850 [Urine:850]  Weight Filed Weights   06/01/13 0416 06/01/13 2049 06/02/13 2100  Weight: 130 kg (286 lb 9.6 oz) 129.956 kg (286 lb 8 oz) 131.8 kg (290 lb 9.1 oz)    Lab Results: Basic Metabolic Panel:  Recent Labs  33/29/51 0534 06/03/13 0430  NA 142 141  K 3.2* 3.3*  CL 101 99  CO2 34* 32  GLUCOSE 147* 143*  BUN 25* 21  CREATININE 1.19 1.08   CBC:  Recent Labs  06/01/13 0440 06/02/13 0534  WBC 12.0* 7.4  HGB 11.6* 10.8*  HCT 37.5* 35.1*  MCV 93.1 92.4  PLT 289 240    Telemetry: Currently in sinus rhythm  Assessment/Plan: 1. Acute on chronic systolic and diastolic heart failure resolved 2. Acute on chronic respiratory failure likely to obesity hypoventilation syndrome 3. Non-ST elevation myocardial infarction likely demand ischemia-he is not a candidate for further cardiac ischemia testing or intervention 4. Peripheral vascular disease 5. Diabetes mellitus  Recommendations:  On oral furosemide and is to go home and f/u .  Discussed importance of daily weights and compliance.Darden Palmer  MD [facility]  06/03/2013, 8:42 AM

## 2013-06-03 NOTE — Progress Notes (Signed)
Patient discharge teaching given, including activity, diet, follow-up appoints, and medications. Patient verbalized understanding of all discharge instructions. IV access was d/c'd. Vitals are stable. Skin is intact except as charted in most recent assessments. Pt to be escorted out by NT, to be driven home by family.  Kandyce Dieguez, MBA, BS, RN 

## 2013-06-05 LAB — CULTURE, BLOOD (ROUTINE X 2)
Culture: NO GROWTH
Culture: NO GROWTH

## 2013-06-09 ENCOUNTER — Other Ambulatory Visit: Payer: Self-pay | Admitting: Surgery

## 2013-06-09 DIAGNOSIS — Z48812 Encounter for surgical aftercare following surgery on the circulatory system: Secondary | ICD-10-CM

## 2013-06-09 DIAGNOSIS — I739 Peripheral vascular disease, unspecified: Secondary | ICD-10-CM

## 2013-06-17 ENCOUNTER — Ambulatory Visit: Payer: Medicare Other

## 2013-06-24 ENCOUNTER — Ambulatory Visit: Payer: Medicare Other

## 2013-06-25 ENCOUNTER — Encounter: Payer: Self-pay | Admitting: Family

## 2013-06-28 ENCOUNTER — Other Ambulatory Visit: Payer: Self-pay | Admitting: Surgery

## 2013-06-28 ENCOUNTER — Ambulatory Visit (INDEPENDENT_AMBULATORY_CARE_PROVIDER_SITE_OTHER): Payer: Medicare Other | Admitting: Family

## 2013-06-28 ENCOUNTER — Ambulatory Visit (HOSPITAL_COMMUNITY)
Admission: RE | Admit: 2013-06-28 | Discharge: 2013-06-28 | Disposition: A | Discharge status: Home / Self Care | Payer: Medicare Other | Source: Ambulatory Visit | Attending: Family | Admitting: Family

## 2013-06-28 ENCOUNTER — Encounter: Payer: Self-pay | Admitting: Family

## 2013-06-28 ENCOUNTER — Ambulatory Visit (HOSPITAL_COMMUNITY)
Admission: RE | Admit: 2013-06-28 | Discharge: 2013-06-28 | Disposition: A | Discharge status: Home / Self Care | Payer: Medicare Other | Source: Ambulatory Visit | Attending: Surgery | Admitting: Surgery

## 2013-06-28 VITALS — BP 138/63 | HR 58 | Resp 18 | Ht 68.0 in | Wt 291.0 lb

## 2013-06-28 DIAGNOSIS — I739 Peripheral vascular disease, unspecified: Secondary | ICD-10-CM

## 2013-06-28 DIAGNOSIS — Z48812 Encounter for surgical aftercare following surgery on the circulatory system: Secondary | ICD-10-CM

## 2013-06-28 DIAGNOSIS — L0390 Cellulitis, unspecified: Secondary | ICD-10-CM

## 2013-06-28 DIAGNOSIS — L0291 Cutaneous abscess, unspecified: Secondary | ICD-10-CM

## 2013-06-28 MED ORDER — CEPHALEXIN 500 MG PO CAPS: 500.0000 mg | ORAL_CAPSULE | Freq: Four times a day (QID) | ORAL | Status: DC

## 2013-06-28 NOTE — Patient Instructions (Addendum)
Peripheral Vascular Disease Peripheral Vascular Disease (PVD), also called Peripheral Arterial Disease (PAD), is a circulation problem caused by cholesterol (atherosclerotic plaque) deposits in the arteries. PVD commonly occurs in the lower extremities (legs) but it can occur in other areas of the body, such as your arms. The cholesterol buildup in the arteries reduces blood flow which can cause pain and other serious problems. The presence of PVD can place a person at risk for Coronary Artery Disease (CAD).  CAUSES  Causes of PVD can be many. It is usually associated with more than one risk factor such as:   High Cholesterol.  Smoking.  Diabetes.  Lack of exercise or inactivity.  High blood pressure (hypertension).  Obesity.  Family history. SYMPTOMS   When the lower extremities are affected, patients with PVD may experience:  Leg pain with exertion or physical activity. This is called INTERMITTENT CLAUDICATION. This may present as cramping or numbness with physical activity. The location of the pain is associated with the level of blockage. For example, blockage at the abdominal level (distal abdominal aorta) may result in buttock or hip pain. Lower leg arterial blockage may result in calf pain.  As PVD becomes more severe, pain can develop with less physical activity.  In people with severe PVD, leg pain may occur at rest.  Other PVD signs and symptoms:  Leg numbness or weakness.  Coldness in the affected leg or foot, especially when compared to the other leg.  A change in leg color.  Patients with significant PVD are more prone to ulcers or sores on toes, feet or legs. These may take longer to heal or may reoccur. The ulcers or sores can become infected.  If signs and symptoms of PVD are ignored, gangrene may occur. This can result in the loss of toes or loss of an entire limb.  Not all leg pain is related to PVD. Other medical conditions can cause leg pain such  as:  Blood clots (embolism) or Deep Vein Thrombosis.  Inflammation of the blood vessels (vasculitis).  Spinal stenosis. DIAGNOSIS  Diagnosis of PVD can involve several different types of tests. These can include:  Pulse Volume Recording Method (PVR). This test is simple, painless and does not involve the use of X-rays. PVR involves measuring and comparing the blood pressure in the arms and legs. An ABI (Ankle-Brachial Index) is calculated. The normal ratio of blood pressures is 1. As this number becomes smaller, it indicates more severe disease.  < 0.95  indicates significant narrowing in one or more leg vessels.  <0.8 there will usually be pain in the foot, leg or buttock with exercise.  <0.4 will usually have pain in the legs at rest.  <0.25  usually indicates limb threatening PVD.  Doppler detection of pulses in the legs. This test is painless and checks to see if you have a pulses in your legs/feet.  A dye or contrast material (a substance that highlights the blood vessels so they show up on x-ray) may be given to help your caregiver better see the arteries for the following tests. The dye is eliminated from your body by the kidney's. Your caregiver may order blood work to check your kidney function and other laboratory values before the following tests are performed:  Magnetic Resonance Angiography (MRA). An MRA is a picture study of the blood vessels and arteries. The MRA machine uses a large magnet to produce images of the blood vessels.  Computed Tomography Angiography (CTA). A CTA is a   specialized x-ray that looks at how the blood flows in your blood vessels. An IV may be inserted into your arm so contrast dye can be injected.  Angiogram. Is a procedure that uses x-rays to look at your blood vessels. This procedure is minimally invasive, meaning a small incision (cut) is made in your groin. A small tube (catheter) is then inserted into the artery of your groin. The catheter is  guided to the blood vessel or artery your caregiver wants to examine. Contrast dye is injected into the catheter. X-rays are then taken of the blood vessel or artery. After the images are obtained, the catheter is taken out. TREATMENT  Treatment of PVD involves many interventions which may include:  Lifestyle changes:  Quitting smoking.  Exercise.  Following a low fat, low cholesterol diet.  Control of diabetes.  Foot care is very important to the PVD patient. Good foot care can help prevent infection.  Medication:  Cholesterol-lowering medicine.  Blood pressure medicine.  Anti-platelet drugs.  Certain medicines may reduce symptoms of Intermittent Claudication.  Interventional/Surgical options:  Angioplasty. An Angioplasty is a procedure that inflates a balloon in the blocked artery. This opens the blocked artery to improve blood flow.  Stent Implant. A wire mesh tube (stent) is placed in the artery. The stent expands and stays in place, allowing the artery to remain open.  Peripheral Bypass Surgery. This is a surgical procedure that reroutes the blood around a blocked artery to help improve blood flow. This type of procedure may be performed if Angioplasty or stent implants are not an option. SEEK IMMEDIATE MEDICAL CARE IF:   You develop pain or numbness in your arms or legs.  Your arm or leg turns cold, becomes blue in color.  You develop redness, warmth, swelling and pain in your arms or legs. MAKE SURE YOU:   Understand these instructions.  Will watch your condition.  Will get help right away if you are not doing well or get worse. Document Released: 08/29/2004 Document Revised: 10/14/2011 Document Reviewed: 07/26/2008 [facility] [place] Patient Information 2014 [place], [place].  Obtain over the counter probiotics, take twice the label directions for at least 2 months. May remove UNA boot in 1 week.

## 2013-06-28 NOTE — Progress Notes (Signed)
[facility] [place] HISTORY AND PHYSICAL -PAD  History of Present Illness Nicholas [name] is a 72 y.o. male patient of Dr. Myra Gianotti who has a chronic left foot wound and peripheral vascular disease. He has undergone multiple interventions on his left popliteal and anterior tibial artery. He is not a candidate for surgical bypass.  When the patient saw Dr. Myra Gianotti six months ago the ulcer on his left leg had healed, and the edema to both lower extremities was dramatically better. His ultrasound six months ago showed stable velocity profile with good ABIs.  Patient complains of 2 days history of worsening swelling in both lower legs. He does not walk much, uses wheelchair, physical therapy comes to his house two days/week, also a nurse visits weekly; he does use a walker sometimes. Was discharged from wounds care a year ago. Patient states he developed MRSA after visiting an outlying facility in the remote past.  reports New Medical or Surgical History: was hospitalized at [facility] a week ago for CHF.   Pt Diabetic: Yes, states in good control. Pt smoker: former smoker, quit 30 years ago  Pt meds include: Statin :Yes Betablocker: Yes ASA: Yes Other anticoagulants/antiplatelets: Plavix  Past Medical History  Diagnosis Date  . Hypertension   . Epilepsy   . Peripheral vascular disease   . Ulcer     left foot  . CKD (chronic kidney disease), stage III   . Dyslipidemia   . Chronic venous insufficiency   . Sleep apnea   . Morbid obesity   . Diverticulosis   . Benign prostatic hypertrophy   . Glaucoma   . Staphylococcus aureus infection   . Gout   . Depression   . Insomnia   . CAD (coronary artery disease), native coronary artery   . GERD (gastroesophageal reflux disease)   . Heart murmur   . Myocardial infarction ~ 11/2011    "in ICU @ [facility]; stress-related"  . Angina   . Pneumonia 12/11/11  . Shortness of breath 12/11/11    "mostly w/exertion"  . Type II  diabetes mellitus   . Chronic daily headache     "~ all the time"  . Seizures     hx; "don't know from what"  . Arthritis   . Chronic combined systolic and diastolic CHF (congestive heart failure)     a. Echo 12/2011: EF 30%, grade 1 dd, mild-mod biatrial enlargement  . Stroke ~ 1983    "little weaker right hand"  . Recent heart attack 12/09/11    Stress heart attack  . MRSA (methicillin resistant staph aureus) culture positive Dec. 17, 2013     right heel infection  . Ischemic cardiomyopathy     Social History History  Substance Use Topics  . Smoking status: Former Smoker -- 1.00 packs/day for 20 years    Types: Cigarettes    Quit date: 07/29/1991  . Smokeless tobacco: Former Neurosurgeon    Types: Chew    Quit date: 08/06/1987  . Alcohol Use: No     Comment: occasionallly    Family History Family History  Problem Relation Age of Onset  . Hypertension Other   . Cancer Other   . Cancer Sister   . Deep vein thrombosis Brother   . Heart disease Brother   . Deep vein thrombosis Brother   . Heart disease Brother   . Cancer Sister     Past Surgical History  Procedure Laterality Date  . Appendectomy    .  Lumbar laminectomy    . Mastoid debridement    . Knee and ankle surgery  1979    "caved in in a ditch; messed up legs"; right  . Cholecystectomy    . Back surgery      "5 or 6"  . Peripheral arterial stent graft      "don't remember which side"  . Coronary artery bypass graft  1983; 1992    "CABG X 5; CABG X 8"  . Lower extremity angiogram  01/21/2012    Procedure: LOWER EXTREMITY ANGIOGRAM;  Surgeon: Nada Libman, MD;  Location: [facility] OR;  Service: Vascular;  Laterality: Bilateral;  WITH POSSIBLE INTERVENTION  . Eye surgery      "laser; both eyes"  . Eye surgery  05/08/2012    Cataract Left eye  . Heel bx  Dec. 17, 2013    Right Heel Bx.    . Transluminal atherectomy popliteal artery  01/21/2012    Left    Allergies  Allergen Reactions  . Contrast Media [Iodinated  Diagnostic Agents] Other (See Comments)    Shaking and feeling terrible    Current Outpatient Prescriptions  Medication Sig Dispense Refill  . allopurinol (ZYLOPRIM) 300 MG tablet Take 300 mg by mouth every morning.       Marland Kitchen ALPRAZolam (XANAX) 1 MG tablet Take 0.5-2 mg by mouth 3 (three) times daily. Takes half tablet in the morning and at 430 pm and 2 tablets at bedtime.      Marland Kitchen aspirin EC 81 MG EC tablet Take 1 tablet (81 mg total) by mouth daily.  100 tablet  3  . atenolol (TENORMIN) 50 MG tablet Take 50 mg by mouth 2 (two) times daily.       . brimonidine-timolol (COMBIGAN) 0.2-0.5 % ophthalmic solution Place 1 drop into both eyes at bedtime.       . clopidogrel (PLAVIX) 75 MG tablet Take 75 mg by mouth every morning.       . diclofenac sodium (VOLTAREN) 1 % GEL Apply 1 application topically 4 (four) times daily as needed. Applies to both knees as needed for pain      . furosemide (LASIX) 80 MG tablet Take 1 tablet (80 mg total) by mouth every morning.  90 tablet  3  . glipiZIDE (GLUCOTROL) 5 MG tablet Take 1 tablet (5 mg total) by mouth 2 (two) times daily before a meal.  60 tablet  11  . HYDROcodone-acetaminophen (NORCO/VICODIN) 5-325 MG per tablet Take 1 tablet by mouth every 4 (four) hours as needed.  120 tablet  0  . insulin glargine (LANTUS) 100 UNIT/ML injection Inject 5-10 Units into the skin 2 (two) times daily as needed. Per sliding scale in divided doses. May use up to 140 units/day. Usual dose is 5-10 units      . isosorbide mononitrate (IMDUR) 30 MG 24 hr tablet Take 30 mg by mouth every morning.       . latanoprost (XALATAN) 0.005 % ophthalmic solution Place 1 drop into both eyes at bedtime.        . nitroGLYCERIN (NITROSTAT) 0.4 MG SL tablet Place 0.4 mg under the tongue every 5 (five) minutes as needed. For chest pain      . omeprazole (PRILOSEC) 20 MG capsule Take 20 mg by mouth 2 (two) times daily.       Marland Kitchen perphenazine (TRILAFON) 8 MG tablet Take 8 mg by mouth 2 (two) times  daily as needed (nausea/vomiting).       Marland Kitchen  potassium chloride SA (K-DUR,KLOR-CON) 20 MEQ tablet Take 2 tablets (40 mEq total) by mouth 2 (two) times daily.  120 tablet  5  . simvastatin (ZOCOR) 20 MG tablet Take 20 mg by mouth every evening.       . tamsulosin (FLOMAX) 0.4 MG CAPS capsule Take 0.4 mg by mouth at bedtime and may repeat dose one time if needed.      . temazepam (RESTORIL) 30 MG capsule Take 30 mg by mouth at bedtime.        No current facility-administered medications for this visit.    ROS: [x]  Positive   [ ]  Denies  General:[ ]  Weight loss,  [ ]  Weight gain, [ ]  Fever, [ ]  chills Neurologic: [ ]  Dizziness, [ ]  Blackouts, [ ]  Seizure [ ]  Stroke, [ ]  "Mini stroke", [ ]  Slurred speech, [ ]  Temporary blindness;  [ ] weakness, [ ]  Hoarseness Cardiac: [ ]  Chest pain/pressure, [ ]  Shortness of breath at rest [ ]  Shortness of breath with exertion,  [ ]   Atrial fibrillation or irregular heartbeat Vascular:[ ]  Pain in legs with walking, [ ]  Pain in legs at rest ,[ ]  Pain in legs at night,  [ ]   Non-healing ulcer, [ ]  Blood clot in vein/DVT,   Pulmonary: [ ]  Home oxygen, [ ]   Productive cough, [ ]  Coughing up blood,  [ ]  Asthma,  [ ]  Wheezing Musculoskeletal:  [ ]  Arthritis, [ ]  Low back pain,  [ ]  Joint pain Hematologic:[ ]  Easy Bruising, [ ]  Anemia; [ ]  Hepatitis Gastrointestinal: [ ]  Blood in stool,  [ ]  Gastroesophageal Reflux, [ ]  Trouble swallowing Urinary: [ ]  chronic Kidney disease, [ ]  on HD, [ ]  Burning with urination, [ ]  Frequent urination, [ ]  Difficulty urinating;  Skin: [ ]  Rashes, [ ]  Wounds     Physical Examination  Filed Vitals:   06/28/13 1217  BP: 138/63  Pulse: 58  Resp: 18   Filed Weights   06/28/13 1217  Weight: 291 lb (131.997 kg)   Body mass index is 44.26 kg/(m^2).  General: A&O x 3, WDWN, morbidly obese. Gait: not walking, in wheelchair or stretcher.  Eyes: Pupils = Pulmonary: Decreased air movement in all fields, without wheezes , rales or  rhonchi. Cardiac: regular Rythm , without murmur.       Carotid Bruits Left Right   Negative Negative  Aorta: is not palpable. Radial pulses: are 2+ and =                           VASCULAR EXAM: Extremities with ischemic changes : left heel ulcer as described in earlier visits, no drainage.  without Gangrene. 3+ pitting edema in both LE's.                                                                                                          LE Pulses LEFT RIGHT       FEMORAL Unable to palpate due to large panus and general obesity Unable  to palpate due to large panus and general obesity        POPLITEAL   palpable    palpable       POSTERIOR TIBIAL   palpable    palpable        DORSALIS PEDIS      ANTERIOR TIBIAL  palpable   palpable    Abdomen: soft, NT, no masses, large panus. Skin: reddened lower left leg. Musculoskeletal: generalized deconditionoi  Neurologic: A&O X 3; Appropriate Affect ; SENSATION: normal; MOTOR FUNCTION:  moving all extremities equally, motor strength 4/5 throughout. Speech is difficult to understand at times.    Non-Invasive Vascular Imaging: DATE: 06/28/2013 ABI: RIGHT 0.57, previous was 0.63 (12/21/12);  LEFT 0.95, previous 0.89 DUPLEX SCAN OF BYPASS: Patent LLE arterial system with mixed plaque noted throughout. Unable to visualize the calf arteries due to induration.   ASSESSMENT: Nicholas [name] is a 72 y.o. male who presents with:  chronic left foot wound and peripheral vascular disease. He has undergone multiple interventions on his left popliteal and anterior tibial artery. He is not a candidate for surgical bypass. Possible cellulitis in LLE. Start Keflex 500 mg qid x 2 weeks, follow up with his PCP, wife states he has a scheduled appointment with his PCP Dec. 9, 2014.  PLAN:  I discussed in depth with the patient the nature of atherosclerosis, and emphasized the importance of maximal medical management including strict control of  blood pressure, blood glucose, and lipid levels, obtaining regular exercise, and cessation of smoking.  The patient is aware that without maximal medical management the underlying atherosclerotic disease process will progress, limiting the benefit of any interventions. Based on the patient's vascular studies and examination, and after discussing with Dr. Myra Gianotti, pt will return to clinic in 6  months for LLE Duplex. Una boot applied to left foot, patient may removed in 1 week.  The patient was given information about PAD including signs, symptoms, treatment, what symptoms should prompt the patient to seek immediate medical care, and risk reduction measures to take.  Charisse March, RN, MSN, FNP-C [facility] Phone: [phone]  Clinic MD: Myra Gianotti  06/28/2013 11:54 AM

## 2013-06-30 ENCOUNTER — Ambulatory Visit: Payer: Medicare Other

## 2013-06-30 NOTE — Addendum Note (Signed)
Addended by: Dannielle Karvonen on: 06/30/2013 09:22 AM   Modules accepted: Orders

## 2013-08-24 ENCOUNTER — Encounter (HOSPITAL_COMMUNITY): Payer: Self-pay | Admitting: Emergency Medicine

## 2013-08-24 ENCOUNTER — Emergency Department (HOSPITAL_COMMUNITY): Payer: Medicare Other

## 2013-08-24 ENCOUNTER — Inpatient Hospital Stay (HOSPITAL_COMMUNITY)
Admission: EM | Admit: 2013-08-24 | Discharge: 2013-08-30 | DRG: 239 | Disposition: A | Discharge status: Rehabilitation Facility | Payer: Medicare Other | Attending: Internal Medicine | Admitting: Internal Medicine

## 2013-08-24 DIAGNOSIS — G4733 Obstructive sleep apnea (adult) (pediatric): Secondary | ICD-10-CM | POA: Diagnosis present

## 2013-08-24 DIAGNOSIS — L03119 Cellulitis of unspecified part of limb: Secondary | ICD-10-CM

## 2013-08-24 DIAGNOSIS — K219 Gastro-esophageal reflux disease without esophagitis: Secondary | ICD-10-CM | POA: Diagnosis present

## 2013-08-24 DIAGNOSIS — E11621 Type 2 diabetes mellitus with foot ulcer: Secondary | ICD-10-CM

## 2013-08-24 DIAGNOSIS — L89154 Pressure ulcer of sacral region, stage 4: Secondary | ICD-10-CM | POA: Diagnosis present

## 2013-08-24 DIAGNOSIS — M109 Gout, unspecified: Secondary | ICD-10-CM | POA: Diagnosis present

## 2013-08-24 DIAGNOSIS — I739 Peripheral vascular disease, unspecified: Secondary | ICD-10-CM | POA: Diagnosis present

## 2013-08-24 DIAGNOSIS — L89109 Pressure ulcer of unspecified part of back, unspecified stage: Secondary | ICD-10-CM | POA: Diagnosis present

## 2013-08-24 DIAGNOSIS — Z8673 Personal history of transient ischemic attack (TIA), and cerebral infarction without residual deficits: Secondary | ICD-10-CM

## 2013-08-24 DIAGNOSIS — E1142 Type 2 diabetes mellitus with diabetic polyneuropathy: Secondary | ICD-10-CM | POA: Diagnosis present

## 2013-08-24 DIAGNOSIS — I2510 Atherosclerotic heart disease of native coronary artery without angina pectoris: Secondary | ICD-10-CM | POA: Diagnosis present

## 2013-08-24 DIAGNOSIS — Z7982 Long term (current) use of aspirin: Secondary | ICD-10-CM

## 2013-08-24 DIAGNOSIS — I10 Essential (primary) hypertension: Secondary | ICD-10-CM | POA: Diagnosis present

## 2013-08-24 DIAGNOSIS — I5042 Chronic combined systolic (congestive) and diastolic (congestive) heart failure: Secondary | ICD-10-CM | POA: Diagnosis present

## 2013-08-24 DIAGNOSIS — L98499 Non-pressure chronic ulcer of skin of other sites with unspecified severity: Secondary | ICD-10-CM | POA: Diagnosis present

## 2013-08-24 DIAGNOSIS — Z7902 Long term (current) use of antithrombotics/antiplatelets: Secondary | ICD-10-CM

## 2013-08-24 DIAGNOSIS — E1149 Type 2 diabetes mellitus with other diabetic neurological complication: Secondary | ICD-10-CM | POA: Diagnosis present

## 2013-08-24 DIAGNOSIS — N400 Enlarged prostate without lower urinary tract symptoms: Secondary | ICD-10-CM | POA: Diagnosis present

## 2013-08-24 DIAGNOSIS — N189 Chronic kidney disease, unspecified: Secondary | ICD-10-CM

## 2013-08-24 DIAGNOSIS — A4902 Methicillin resistant Staphylococcus aureus infection, unspecified site: Secondary | ICD-10-CM | POA: Diagnosis present

## 2013-08-24 DIAGNOSIS — F329 Major depressive disorder, single episode, unspecified: Secondary | ICD-10-CM | POA: Diagnosis present

## 2013-08-24 DIAGNOSIS — Z91199 Patient's noncompliance with other medical treatment and regimen due to unspecified reason: Secondary | ICD-10-CM

## 2013-08-24 DIAGNOSIS — Z79899 Other long term (current) drug therapy: Secondary | ICD-10-CM

## 2013-08-24 DIAGNOSIS — F3289 Other specified depressive episodes: Secondary | ICD-10-CM | POA: Diagnosis present

## 2013-08-24 DIAGNOSIS — N183 Chronic kidney disease, stage 3 unspecified: Secondary | ICD-10-CM | POA: Diagnosis present

## 2013-08-24 DIAGNOSIS — L02619 Cutaneous abscess of unspecified foot: Secondary | ICD-10-CM | POA: Diagnosis present

## 2013-08-24 DIAGNOSIS — I119 Hypertensive heart disease without heart failure: Secondary | ICD-10-CM

## 2013-08-24 DIAGNOSIS — E669 Obesity, unspecified: Secondary | ICD-10-CM | POA: Diagnosis present

## 2013-08-24 DIAGNOSIS — L97409 Non-pressure chronic ulcer of unspecified heel and midfoot with unspecified severity: Secondary | ICD-10-CM | POA: Diagnosis present

## 2013-08-24 DIAGNOSIS — F411 Generalized anxiety disorder: Secondary | ICD-10-CM | POA: Diagnosis present

## 2013-08-24 DIAGNOSIS — Z951 Presence of aortocoronary bypass graft: Secondary | ICD-10-CM

## 2013-08-24 DIAGNOSIS — E1159 Type 2 diabetes mellitus with other circulatory complications: Principal | ICD-10-CM | POA: Diagnosis present

## 2013-08-24 DIAGNOSIS — L8994 Pressure ulcer of unspecified site, stage 4: Secondary | ICD-10-CM | POA: Diagnosis present

## 2013-08-24 DIAGNOSIS — I509 Heart failure, unspecified: Secondary | ICD-10-CM

## 2013-08-24 DIAGNOSIS — E785 Hyperlipidemia, unspecified: Secondary | ICD-10-CM | POA: Diagnosis present

## 2013-08-24 DIAGNOSIS — G40909 Epilepsy, unspecified, not intractable, without status epilepticus: Secondary | ICD-10-CM | POA: Diagnosis present

## 2013-08-24 DIAGNOSIS — Z87891 Personal history of nicotine dependence: Secondary | ICD-10-CM

## 2013-08-24 DIAGNOSIS — I252 Old myocardial infarction: Secondary | ICD-10-CM

## 2013-08-24 DIAGNOSIS — I130 Hypertensive heart and chronic kidney disease with heart failure and stage 1 through stage 4 chronic kidney disease, or unspecified chronic kidney disease: Secondary | ICD-10-CM | POA: Diagnosis present

## 2013-08-24 DIAGNOSIS — Z66 Do not resuscitate: Secondary | ICD-10-CM | POA: Diagnosis present

## 2013-08-24 DIAGNOSIS — I96 Gangrene, not elsewhere classified: Secondary | ICD-10-CM | POA: Diagnosis present

## 2013-08-24 DIAGNOSIS — I7025 Atherosclerosis of native arteries of other extremities with ulceration: Secondary | ICD-10-CM | POA: Diagnosis present

## 2013-08-24 DIAGNOSIS — I2589 Other forms of chronic ischemic heart disease: Secondary | ICD-10-CM | POA: Diagnosis present

## 2013-08-24 DIAGNOSIS — Z9119 Patient's noncompliance with other medical treatment and regimen: Secondary | ICD-10-CM

## 2013-08-24 DIAGNOSIS — L97509 Non-pressure chronic ulcer of other part of unspecified foot with unspecified severity: Secondary | ICD-10-CM

## 2013-08-24 DIAGNOSIS — E1169 Type 2 diabetes mellitus with other specified complication: Secondary | ICD-10-CM

## 2013-08-24 LAB — CBC WITH DIFFERENTIAL/PLATELET
Basophils Absolute: 0 10*3/uL (ref 0.0–0.1)
Basophils Relative: 0 % (ref 0–1)
EOS ABS: 0.2 10*3/uL (ref 0.0–0.7)
Eosinophils Relative: 2 % (ref 0–5)
HCT: 41.6 % (ref 39.0–52.0)
HEMOGLOBIN: 12.8 g/dL — AB (ref 13.0–17.0)
LYMPHS ABS: 2.7 10*3/uL (ref 0.7–4.0)
Lymphocytes Relative: 29 % (ref 12–46)
MCH: 26.3 pg (ref 26.0–34.0)
MCHC: 30.8 g/dL (ref 30.0–36.0)
MCV: 85.4 fL (ref 78.0–100.0)
MONOS PCT: 9 % (ref 3–12)
Monocytes Absolute: 0.8 10*3/uL (ref 0.1–1.0)
Neutro Abs: 5.6 10*3/uL (ref 1.7–7.7)
Neutrophils Relative %: 60 % (ref 43–77)
PLATELETS: 290 10*3/uL (ref 150–400)
RBC: 4.87 MIL/uL (ref 4.22–5.81)
RDW: 15.9 % — ABNORMAL HIGH (ref 11.5–15.5)
WBC: 9.4 10*3/uL (ref 4.0–10.5)

## 2013-08-24 LAB — COMPREHENSIVE METABOLIC PANEL
ALT: 6 U/L (ref 0–53)
AST: 9 U/L (ref 0–37)
Albumin: 3 g/dL — ABNORMAL LOW (ref 3.5–5.2)
Alkaline Phosphatase: 96 U/L (ref 39–117)
BUN: 14 mg/dL (ref 6–23)
CALCIUM: 9.1 mg/dL (ref 8.4–10.5)
CO2: 29 mEq/L (ref 19–32)
Chloride: 100 mEq/L (ref 96–112)
Creatinine, Ser: 1.26 mg/dL (ref 0.50–1.35)
GFR calc non Af Amer: 56 mL/min — ABNORMAL LOW (ref >90)
GFR, EST AFRICAN AMERICAN: 65 mL/min — AB (ref >90)
GLUCOSE: 130 mg/dL — AB (ref 70–99)
Potassium: 4.4 mEq/L (ref 3.7–5.3)
Sodium: 143 mEq/L (ref 137–147)
TOTAL PROTEIN: 7.4 g/dL (ref 6.0–8.3)
Total Bilirubin: 0.2 mg/dL — ABNORMAL LOW (ref 0.3–1.2)

## 2013-08-24 LAB — C-REACTIVE PROTEIN: CRP: 11.1 mg/dL — ABNORMAL HIGH (ref <0.60)

## 2013-08-24 LAB — GLUCOSE, CAPILLARY: Glucose-Capillary: 122 mg/dL — ABNORMAL HIGH (ref 70–99)

## 2013-08-24 LAB — SEDIMENTATION RATE: SED RATE: 44 mm/h — AB (ref 0–16)

## 2013-08-24 MED ORDER — ATENOLOL 50 MG PO TABS
50.0000 mg | ORAL_TABLET | Freq: Two times a day (BID) | ORAL | Status: DC
  Administered 2013-08-24 – 2013-08-30 (×11): 50 mg via ORAL
  Filled 2013-08-24 (×15): qty 1

## 2013-08-24 MED ORDER — PIPERACILLIN-TAZOBACTAM 3.375 G IVPB
3.3750 g | Freq: Three times a day (TID) | INTRAVENOUS | Status: DC
  Administered 2013-08-25 – 2013-08-29 (×12): 3.375 g via INTRAVENOUS
  Filled 2013-08-24 (×17): qty 50

## 2013-08-24 MED ORDER — SODIUM CHLORIDE 0.9 % IJ SOLN
3.0000 mL | Freq: Two times a day (BID) | INTRAMUSCULAR | Status: DC
Start: 2013-08-24 — End: 2013-08-30
  Administered 2013-08-24 – 2013-08-30 (×4): 3 mL via INTRAVENOUS

## 2013-08-24 MED ORDER — NITROGLYCERIN 0.4 MG SL SUBL: 0.4000 mg | SUBLINGUAL_TABLET | SUBLINGUAL | Status: DC | PRN

## 2013-08-24 MED ORDER — VANCOMYCIN HCL IN DEXTROSE 1-5 GM/200ML-% IV SOLN
1000.0000 mg | Freq: Two times a day (BID) | INTRAVENOUS | Status: DC
  Administered 2013-08-25 – 2013-08-28 (×7): 1000 mg via INTRAVENOUS
  Filled 2013-08-24 (×9): qty 200

## 2013-08-24 MED ORDER — GLIPIZIDE 5 MG PO TABS
5.0000 mg | ORAL_TABLET | Freq: Two times a day (BID) | ORAL | Status: DC
  Administered 2013-08-25 – 2013-08-30 (×11): 5 mg via ORAL
  Filled 2013-08-24 (×14): qty 1

## 2013-08-24 MED ORDER — ZOLPIDEM TARTRATE 5 MG PO TABS
5.0000 mg | ORAL_TABLET | Freq: Every day | ORAL | Status: DC
  Administered 2013-08-25 – 2013-08-29 (×6): 5 mg via ORAL
  Filled 2013-08-24 (×6): qty 1

## 2013-08-24 MED ORDER — HYDROCODONE-ACETAMINOPHEN 5-325 MG PO TABS
1.0000 | ORAL_TABLET | ORAL | Status: DC | PRN
  Administered 2013-08-24 – 2013-08-29 (×7): 1 via ORAL
  Filled 2013-08-24 (×7): qty 1

## 2013-08-24 MED ORDER — ALPRAZOLAM 0.5 MG PO TABS
0.5000 mg | ORAL_TABLET | ORAL | Status: DC
  Administered 2013-08-25 – 2013-08-30 (×10): 0.5 mg via ORAL
  Filled 2013-08-24 (×11): qty 1

## 2013-08-24 MED ORDER — BRIMONIDINE TARTRATE-TIMOLOL 0.2-0.5 % OP SOLN: 1.0000 [drp] | Freq: Every day | OPHTHALMIC | Status: DC

## 2013-08-24 MED ORDER — ACETAMINOPHEN 650 MG RE SUPP: 650.0000 mg | Freq: Four times a day (QID) | RECTAL | Status: DC | PRN

## 2013-08-24 MED ORDER — LATANOPROST 0.005 % OP SOLN
1.0000 [drp] | Freq: Every day | OPHTHALMIC | Status: DC
  Administered 2013-08-24 – 2013-08-29 (×6): 1 [drp] via OPHTHALMIC
  Filled 2013-08-24: qty 2.5

## 2013-08-24 MED ORDER — BRIMONIDINE TARTRATE 0.2 % OP SOLN
1.0000 [drp] | Freq: Every day | OPHTHALMIC | Status: DC
  Administered 2013-08-25 – 2013-08-29 (×6): 1 [drp] via OPHTHALMIC
  Filled 2013-08-24: qty 5

## 2013-08-24 MED ORDER — INSULIN ASPART 100 UNIT/ML ~~LOC~~ SOLN
0.0000 [IU] | Freq: Three times a day (TID) | SUBCUTANEOUS | Status: DC
  Administered 2013-08-25: 1 [IU] via SUBCUTANEOUS
  Administered 2013-08-25 – 2013-08-28 (×4): 2 [IU] via SUBCUTANEOUS
  Administered 2013-08-28 (×2): 1 [IU] via SUBCUTANEOUS

## 2013-08-24 MED ORDER — ALLOPURINOL 300 MG PO TABS
300.0000 mg | ORAL_TABLET | Freq: Every morning | ORAL | Status: DC
  Administered 2013-08-25 – 2013-08-30 (×5): 300 mg via ORAL
  Filled 2013-08-24 (×6): qty 1

## 2013-08-24 MED ORDER — SIMVASTATIN 20 MG PO TABS
20.0000 mg | ORAL_TABLET | Freq: Every evening | ORAL | Status: DC
  Administered 2013-08-25 – 2013-08-29 (×5): 20 mg via ORAL
  Filled 2013-08-24 (×6): qty 1

## 2013-08-24 MED ORDER — POTASSIUM CHLORIDE CRYS ER 20 MEQ PO TBCR
40.0000 meq | EXTENDED_RELEASE_TABLET | Freq: Two times a day (BID) | ORAL | Status: DC
  Administered 2013-08-25 – 2013-08-30 (×11): 40 meq via ORAL
  Filled 2013-08-24 (×15): qty 2

## 2013-08-24 MED ORDER — PANTOPRAZOLE SODIUM 40 MG PO TBEC
40.0000 mg | DELAYED_RELEASE_TABLET | Freq: Every day | ORAL | Status: DC
  Administered 2013-08-25 – 2013-08-30 (×5): 40 mg via ORAL
  Filled 2013-08-24 (×5): qty 1

## 2013-08-24 MED ORDER — ONDANSETRON HCL 4 MG PO TABS: 4.0000 mg | ORAL_TABLET | Freq: Four times a day (QID) | ORAL | Status: DC | PRN

## 2013-08-24 MED ORDER — ASPIRIN EC 81 MG PO TBEC
81.0000 mg | DELAYED_RELEASE_TABLET | Freq: Every day | ORAL | Status: DC
Start: 2013-08-25 — End: 2013-08-30
  Administered 2013-08-25 – 2013-08-30 (×5): 81 mg via ORAL
  Filled 2013-08-24 (×7): qty 1

## 2013-08-24 MED ORDER — ALPRAZOLAM 0.5 MG PO TABS
2.0000 mg | ORAL_TABLET | Freq: Every day | ORAL | Status: DC
  Administered 2013-08-25 – 2013-08-29 (×6): 2 mg via ORAL
  Filled 2013-08-24 (×6): qty 4

## 2013-08-24 MED ORDER — ISOSORBIDE MONONITRATE ER 30 MG PO TB24
30.0000 mg | ORAL_TABLET | Freq: Every morning | ORAL | Status: DC
  Administered 2013-08-25 – 2013-08-30 (×5): 30 mg via ORAL
  Filled 2013-08-24 (×6): qty 1

## 2013-08-24 MED ORDER — VANCOMYCIN HCL IN DEXTROSE 1-5 GM/200ML-% IV SOLN
1000.0000 mg | Freq: Once | INTRAVENOUS | Status: AC
  Administered 2013-08-24: 1000 mg via INTRAVENOUS
  Filled 2013-08-24: qty 200

## 2013-08-24 MED ORDER — TAMSULOSIN HCL 0.4 MG PO CAPS
0.8000 mg | ORAL_CAPSULE | Freq: Every day | ORAL | Status: DC
  Administered 2013-08-24 – 2013-08-29 (×6): 0.8 mg via ORAL
  Filled 2013-08-24 (×9): qty 2

## 2013-08-24 MED ORDER — CLOPIDOGREL BISULFATE 75 MG PO TABS
75.0000 mg | ORAL_TABLET | Freq: Every day | ORAL | Status: DC
  Administered 2013-08-25 – 2013-08-30 (×6): 75 mg via ORAL
  Filled 2013-08-24 (×9): qty 1

## 2013-08-24 MED ORDER — MORPHINE SULFATE 2 MG/ML IJ SOLN
1.0000 mg | INTRAMUSCULAR | Status: DC | PRN
  Administered 2013-08-27 – 2013-08-29 (×8): 1 mg via INTRAVENOUS
  Filled 2013-08-24 (×8): qty 1

## 2013-08-24 MED ORDER — ACETAMINOPHEN 325 MG PO TABS: 650.0000 mg | ORAL_TABLET | Freq: Four times a day (QID) | ORAL | Status: DC | PRN

## 2013-08-24 MED ORDER — TIMOLOL MALEATE 0.5 % OP SOLN
1.0000 [drp] | Freq: Every day | OPHTHALMIC | Status: DC
  Administered 2013-08-24 – 2013-08-29 (×6): 1 [drp] via OPHTHALMIC
  Filled 2013-08-24: qty 5

## 2013-08-24 MED ORDER — ONDANSETRON HCL 4 MG/2ML IJ SOLN: 4.0000 mg | Freq: Four times a day (QID) | INTRAMUSCULAR | Status: DC | PRN

## 2013-08-24 MED ORDER — FUROSEMIDE 80 MG PO TABS
80.0000 mg | ORAL_TABLET | Freq: Every morning | ORAL | Status: DC
  Administered 2013-08-25 – 2013-08-30 (×5): 80 mg via ORAL
  Filled 2013-08-24 (×6): qty 1

## 2013-08-24 MED ORDER — PIPERACILLIN-TAZOBACTAM 3.375 G IVPB 30 MIN
3.3750 g | Freq: Once | INTRAVENOUS | Status: AC
  Administered 2013-08-24: 3.375 g via INTRAVENOUS
  Filled 2013-08-24: qty 50

## 2013-08-24 MED ORDER — INSULIN GLARGINE 100 UNIT/ML ~~LOC~~ SOLN
5.0000 [IU] | Freq: Every day | SUBCUTANEOUS | Status: DC
  Administered 2013-08-25 – 2013-08-28 (×3): 5 [IU] via SUBCUTANEOUS
  Filled 2013-08-24 (×6): qty 0.05

## 2013-08-24 NOTE — Progress Notes (Signed)
ANTIBIOTIC CONSULT NOTE - INITIAL  Pharmacy Consult for Vancomycin/Zosyn Indication: Cellulitis  Allergies  Allergen Reactions  . Contrast Media [Iodinated Diagnostic Agents] Other (See Comments)    Shaking and feeling terrible    Patient Measurements: Weight: 270 lb (122.471 kg)  Vital Signs: Temp: 98.8 F (37.1 C) (01/20 1639) Temp src: Oral (01/20 1639) BP: 136/48 mmHg (01/20 2230) Pulse Rate: 56 (01/20 2230)  Labs:  Recent Labs  08/24/13 1304  WBC 9.4  HGB 12.8*  PLT 290  CREATININE 1.26   Medical History: Past Medical History  Diagnosis Date  . Hypertension   . Epilepsy   . Peripheral vascular disease   . Ulcer     left foot  . CKD (chronic kidney disease), stage III   . Dyslipidemia   . Chronic venous insufficiency   . Sleep apnea   . Morbid obesity   . Diverticulosis   . Benign prostatic hypertrophy   . Glaucoma   . Staphylococcus aureus infection   . Gout   . Depression   . Insomnia   . CAD (coronary artery disease), native coronary artery   . GERD (gastroesophageal reflux disease)   . Heart murmur   . Myocardial infarction ~ 11/2011    "in [facility]; stress-related"  . Angina   . Pneumonia 12/11/11  . Shortness of breath 12/11/11    "mostly w/exertion"  . Type II diabetes mellitus   . Chronic daily headache     "~ all the time"  . Seizures     hx; "don't know from what"  . Arthritis   . Chronic combined systolic and diastolic CHF (congestive heart failure)     a. Echo 12/2011: EF 30%, grade 1 dd, mild-mod biatrial enlargement  . Stroke ~ 1983    "little weaker right hand"  . Recent heart attack 12/09/11    Stress heart attack  . MRSA (methicillin resistant staph aureus) culture positive Dec. 17, 2013     right heel infection  . Ischemic cardiomyopathy   . Blood in urine     per pt.'s wife  blood in urine 2 weeks ago   required catherization    Assessment: 73 y/o M with left foot cellulitis (no osteo per imaging). Labs as above.    ED Antibiotics  Vancomycin 1000 mg IV x 1 at 1926 Zosyn 3.375g IV x 1 at 1839  Goal of Therapy:  Vancomycin trough level 10-15 mcg/ml  Plan:  -Complete vancomycin 2000 mg IV LOAD, then 1000 mg IV q12h -Zosyn 3.375G IV q8h to be infused over 4 hours -F/U any cultures, imaging -F/U renal function -Drug levels as indicated   Abran DukeLedford, [place] 08/24/2013,11:11 PM

## 2013-08-24 NOTE — H&P (Signed)
[facility] History and Physical  Nicholas [name] ZOX:[id number] DOB: 1941/03/24 DOA: 08/24/2013  Referring physician: ER physician. PCP: Pearla Dubonnet, MD   Chief Complaint: Left foot ulcer.  HPI: Nicholas [name] is a 73 y.o. male with history of diabetes mellitus2, CAD status post CABG, CHF/ischemic cardiomyopathy last EF was 30%, peripheral vascular disease, history of seizures and chronic venous insufficiency, OSA noncompliant with CPAP was referred to the ER by patient's primary care physician after patient was noticed to have worsening left foot ulcer with gangrene is looking base. Patient states that he has had previous lower extremity ulcers which has healed and over the last one month he has noticed discharge the left foot heel area which lasted and over the last one week has been having some drainage. Patient's home health aid noticed that patient is discharged has become very odorous and requested patient to go to his primary care physician. He was eventually referred to the ER. In the ER x-rays do not show any signs of osteomyelitis. Patient has been started on empiric antibiotics for cellulitis and admitted for further management. Patient states he has had some subjective feeling of fever chills 2 days ago otherwise patient is presently afebrile denies any chest pain shortness of breath nausea vomiting abdominal pain diarrhea. Patient has chronic sacral decubitus ulcers and is usually bed bound.   Review of Systems: As presented in the history of presenting illness, rest negative.  Past Medical History  Diagnosis Date  . Hypertension   . Epilepsy   . Peripheral vascular disease   . Ulcer     left foot  . CKD (chronic kidney disease), stage III   . Dyslipidemia   . Chronic venous insufficiency   . Sleep apnea   . Morbid obesity   . Diverticulosis   . Benign prostatic hypertrophy   . Glaucoma   . Staphylococcus aureus infection   . Gout   . Depression   .  Insomnia   . CAD (coronary artery disease), native coronary artery   . GERD (gastroesophageal reflux disease)   . Heart murmur   . Myocardial infarction ~ 11/2011    "in ICU @ [facility]; stress-related"  . Angina   . Pneumonia 12/11/11  . Shortness of breath 12/11/11    "mostly w/exertion"  . Type II diabetes mellitus   . Chronic daily headache     "~ all the time"  . Seizures     hx; "don't know from what"  . Arthritis   . Chronic combined systolic and diastolic CHF (congestive heart failure)     a. Echo 12/2011: EF 30%, grade 1 dd, mild-mod biatrial enlargement  . Stroke ~ 1983    "little weaker right hand"  . Recent heart attack 12/09/11    Stress heart attack  . MRSA (methicillin resistant staph aureus) culture positive Dec. 17, 2013     right heel infection  . Ischemic cardiomyopathy   . Blood in urine     per pt.'s wife  blood in urine 2 weeks ago   required catherization   Past Surgical History  Procedure Laterality Date  . Appendectomy    . Lumbar laminectomy    . Mastoid debridement    . Knee and ankle surgery  1979    "caved in in a ditch; messed up legs"; right  . Cholecystectomy    . Back surgery      "5 or 6"  . Peripheral arterial stent graft      "  don't remember which side"  . Coronary artery bypass graft  1983; 1992    "CABG X 5; CABG X 8"  . Lower extremity angiogram  01/21/2012    Procedure: LOWER EXTREMITY ANGIOGRAM;  Surgeon: Nada LibmanVance W Brabham, MD;  Location: [facility] OR;  Service: Vascular;  Laterality: Bilateral;  WITH POSSIBLE INTERVENTION  . Eye surgery      "laser; both eyes"  . Eye surgery  05/08/2012    Cataract Left eye  . Heel bx  Dec. 17, 2013    Right Heel Bx.    . Transluminal atherectomy popliteal artery  01/21/2012    Left   Social History:  reports that he quit smoking about 22 years ago. His smoking use included Cigarettes. He has a 20 pack-year smoking history. He quit smokeless tobacco use about 26 years ago. His smokeless tobacco use included  Chew. He reports that he does not drink alcohol or use illicit drugs. Where does patient live home. Can patient participate in ADLs? No.  Allergies  Allergen Reactions  . Contrast Media [Iodinated Diagnostic Agents] Other (See Comments)    Shaking and feeling terrible    Family History:  Family History  Problem Relation Age of Onset  . Hypertension Other   . Cancer Other   . Cancer Sister   . Deep vein thrombosis Brother   . Heart disease Brother   . Deep vein thrombosis Brother   . Heart disease Brother   . Cancer Sister       Prior to Admission medications   Medication Sig Start Date End Date Taking? Authorizing Provider  allopurinol (ZYLOPRIM) 300 MG tablet Take 300 mg by mouth every morning.    Yes Historical Provider, MD  ALPRAZolam Prudy Feeler(XANAX) 1 MG tablet Take 0.5-2 mg by mouth 3 (three) times daily. Takes half tablet in the morning and at 430 pm and 2 tablets at bedtime.   Yes Historical Provider, MD  aspirin EC 81 MG EC tablet Take 1 tablet (81 mg total) by mouth daily. 06/03/13  Yes Marden Nobleobert Gates, MD  atenolol (TENORMIN) 50 MG tablet Take 50 mg by mouth 2 (two) times daily.    Yes Historical Provider, MD  brimonidine-timolol (COMBIGAN) 0.2-0.5 % ophthalmic solution Place 1 drop into both eyes at bedtime.    Yes Historical Provider, MD  clopidogrel (PLAVIX) 75 MG tablet Take 75 mg by mouth every morning.    Yes Historical Provider, MD  diclofenac sodium (VOLTAREN) 1 % GEL Apply 1 application topically 4 (four) times daily as needed. Applies to both knees as needed for pain   Yes Historical Provider, MD  furosemide (LASIX) 80 MG tablet Take 1 tablet (80 mg total) by mouth every morning. 06/03/13  Yes Marden Nobleobert Gates, MD  glipiZIDE (GLUCOTROL) 5 MG tablet Take 1 tablet (5 mg total) by mouth 2 (two) times daily before a meal. 06/03/13  Yes Marden Nobleobert Gates, MD  HYDROcodone-acetaminophen (NORCO/VICODIN) 5-325 MG per tablet Take 1 tablet by mouth every 4 (four) hours as needed. 06/03/13  Yes  Marden Nobleobert Gates, MD  insulin glargine (LANTUS) 100 UNIT/ML injection Inject 10-40 Units into the skin 2 (two) times daily as needed. Per sliding scale in divided doses. May use up to 140 units/day. Usual dose is 5-10 units   Yes Historical Provider, MD  isosorbide mononitrate (IMDUR) 30 MG 24 hr tablet Take 30 mg by mouth every morning.    Yes Historical Provider, MD  latanoprost (XALATAN) 0.005 % ophthalmic solution Place 1 drop into both eyes  at bedtime.     Yes Historical Provider, MD  nitroGLYCERIN (NITROSTAT) 0.4 MG SL tablet Place 0.4 mg under the tongue every 5 (five) minutes as needed. For chest pain   Yes Historical Provider, MD  omeprazole (PRILOSEC) 20 MG capsule Take 20 mg by mouth 2 (two) times daily.    Yes Historical Provider, MD  potassium chloride SA (K-DUR,KLOR-CON) 20 MEQ tablet Take 2 tablets (40 mEq total) by mouth 2 (two) times daily. 06/03/13  Yes Marden Noble, MD  simvastatin (ZOCOR) 20 MG tablet Take 20 mg by mouth every evening.    Yes Historical Provider, MD  tamsulosin (FLOMAX) 0.4 MG CAPS capsule Take 0.8 mg by mouth at bedtime.  05/06/13  Yes Historical Provider, MD  temazepam (RESTORIL) 30 MG capsule Take 30 mg by mouth at bedtime.    Yes Historical Provider, MD    Physical Exam: Filed Vitals:   08/24/13 2030 08/24/13 2100 08/24/13 2130 08/24/13 2230  BP: 144/58 164/54 122/97 136/48  Pulse: 63 64 54 56  Temp:      TempSrc:      Resp:      Weight:      SpO2: 100% 100% 100% 99%     General:  Well-developed and nourished.  Eyes: Anicteric no pallor.  ENT: No discharge from the ears eyes nose mouth.  Neck: No mass felt.  Cardiovascular: S1-S2 heard.  Respiratory: No rhonchi or crepitations.  Abdomen: Soft nontender bowel sounds present.  Skin: Patient has a large left heel ulcer measuring around 5 cm diameter with black dark base with some discharge. There is also erythema of the left leg with warmth.  Musculoskeletal: Has chronic left lower extremity  edema.  Psychiatric: Appears normal.  Neurologic: Alert awake oriented to time place and person. Moves all extremities.  Labs on Admission:  Basic Metabolic Panel:  Recent Labs Lab 08/24/13 1304  NA 143  K 4.4  CL 100  CO2 29  GLUCOSE 130*  BUN 14  CREATININE 1.26  CALCIUM 9.1   Liver Function Tests:  Recent Labs Lab 08/24/13 1304  AST 9  ALT 6  ALKPHOS 96  BILITOT 0.2*  PROT 7.4  ALBUMIN 3.0*   No results found for this basename: LIPASE, AMYLASE,  in the last 168 hours No results found for this basename: AMMONIA,  in the last 168 hours CBC:  Recent Labs Lab 08/24/13 1304  WBC 9.4  NEUTROABS 5.6  HGB 12.8*  HCT 41.6  MCV 85.4  PLT 290   Cardiac Enzymes: No results found for this basename: CKTOTAL, CKMB, CKMBINDEX, TROPONINI,  in the last 168 hours  BNP (last 3 results)  Recent Labs  06/01/13 0440 06/02/13 0534 06/03/13 0430  PROBNP 3738.0* 2280.0* 1275.0*   CBG: No results found for this basename: GLUCAP,  in the last 168 hours  Radiological Exams on Admission: Dg Foot Complete Left  08/24/2013   CLINICAL DATA:  Open wound, infection  EXAM: LEFT FOOT - COMPLETE 3+ VIEW  COMPARISON:  08/28/2011  FINDINGS: Marked soft tissue swelling. Bones are osteopenic. Degenerative changes noted most pronounced of the left first MTP joint. No gross malalignment. Soft tissue ulceration noted of the calcaneal region. No definite displaced fracture or osseous destruction by plain radiography. Soft tissue calcifications present.  IMPRESSION: Soft tissue swelling.  Soft tissue ulceration of the heel region  Diffuse osteopenia and degenerative changes  No definite plain radiographic evidence of bone loss or osteomyelitis. No fracture.   Electronically Signed  By: Ruel Favors M.D.   On: 08/24/2013 18:46    Assessment/Plan Principal Problem:   Cellulitis of foot Active Problems:   Type 2 diabetes mellitus with vascular disease   Hypertensive heart disease without  CHF   PAD (peripheral artery disease)   CAD (coronary artery disease), native coronary artery   1. Infected left foot ulcer with cellulitis - patient's left foot ulcer looks gangrenous. At this time I have placed patient on vancomycin and Zosyn. I have ordered MRI of the left foot to check for any osteomyelitis and have requested wound team consult. Patient probably will need vascular surgery input also given the nature of patient's ulcer and history of peripheral vascular disease. 2. Diabetes mellitus type 2 - I have continued patient's home medications including Lantus and glipizide closely follow CBGs with sliding-scale coverage. 3. Peripheral arterial disease status post stenting - continue antiplatelet agents. See #1. 4. History of CAD status post CABG - denies any chest pain. 5. History of ischemic cardiomyopathy last EF was 30% - continue Lasix and closely follow intake output. 6. History of OSA noncompliant with CPAP. 7. Sacral decubitus ulcer - wound team has been consulted.  I have reviewed patient's old labs and charts.  Code Status: DO NOT RESUSCITATE.  Family Communication: Patient's wife at the bedside.  Disposition Plan: Admit to inpatient under Dr. Kevan Ny.    Takya Vandivier N. [facility] Pager [phone].  If 7PM-7AM, please contact night-coverage [email or web address] Password [facility] 08/24/2013, 10:38 PM

## 2013-08-24 NOTE — ED Provider Notes (Signed)
CSN: [id number]     Arrival date & time 08/24/13  1256 History   First MD Initiated Contact with Patient 08/24/13 1648     Chief Complaint  Patient presents with  . Wound Infection    HPI: [name] is a 73 yo M with history of HTN, PVD, chronic left foot ulcer, DM, CAD s/p CABG who presents with left foot infection. He has a history of chronic left foot ulcer which apparently healed after bypass to vessels in left LE. One month ago he noted a small "blister," on his left heel. It became larger over time. Two weeks ago it popped. Since that time he has had worsening redness to this area. Over the last couple of days it has become malodorous. His home health nurse was concerned about infection so he was referred to his PCP today. His PCP sent him to the ED for further evaluation. He endorses decreased appetite, but this appears to be a chronic problem. Chills a few days ago but no measured temperature. No diarrhea, vomiting or abdominal pain. He is non-ambulatory at baseline.    Past Medical History  Diagnosis Date  . Hypertension   . Epilepsy   . Peripheral vascular disease   . Ulcer     left foot  . CKD (chronic kidney disease), stage III   . Dyslipidemia   . Chronic venous insufficiency   . Sleep apnea   . Morbid obesity   . Diverticulosis   . Benign prostatic hypertrophy   . Glaucoma   . Staphylococcus aureus infection   . Gout   . Depression   . Insomnia   . CAD (coronary artery disease), native coronary artery   . GERD (gastroesophageal reflux disease)   . Heart murmur   . Myocardial infarction ~ 11/2011    "in ICU @ [facility]; stress-related"  . Angina   . Pneumonia 12/11/11  . Shortness of breath 12/11/11    "mostly w/exertion"  . Type II diabetes mellitus   . Chronic daily headache     "~ all the time"  . Seizures     hx; "don't know from what"  . Arthritis   . Chronic combined systolic and diastolic CHF (congestive heart failure)     a. Echo 12/2011: EF 30%, grade  1 dd, mild-mod biatrial enlargement  . Stroke ~ 1983    "little weaker right hand"  . Recent heart attack 12/09/11    Stress heart attack  . MRSA (methicillin resistant staph aureus) culture positive Dec. 17, 2013     right heel infection  . Ischemic cardiomyopathy   . Blood in urine     per pt.'s wife  blood in urine 2 weeks ago   required catherization   Past Surgical History  Procedure Laterality Date  . Appendectomy    . Lumbar laminectomy    . Mastoid debridement    . Knee and ankle surgery  1979    "caved in in a ditch; messed up legs"; right  . Cholecystectomy    . Back surgery      "5 or 6"  . Peripheral arterial stent graft      "don't remember which side"  . Coronary artery bypass graft  1983; 1992    "CABG X 5; CABG X 8"  . Lower extremity angiogram  01/21/2012    Procedure: LOWER EXTREMITY ANGIOGRAM;  Surgeon: Nada Libman, MD;  Location: [facility] OR;  Service: Vascular;  Laterality: Bilateral;  WITH POSSIBLE  INTERVENTION  . Eye surgery      "laser; both eyes"  . Eye surgery  05/08/2012    Cataract Left eye  . Heel bx  Dec. 17, 2013    Right Heel Bx.    . Transluminal atherectomy popliteal artery  01/21/2012    Left   Family History  Problem Relation Age of Onset  . Hypertension Other   . Cancer Other   . Cancer Sister   . Deep vein thrombosis Brother   . Heart disease Brother   . Deep vein thrombosis Brother   . Heart disease Brother   . Cancer Sister    History  Substance Use Topics  . Smoking status: Former Smoker -- 1.00 packs/day for 20 years    Types: Cigarettes    Quit date: 07/29/1991  . Smokeless tobacco: Former Systems developer    Types: [place] date: 08/06/1987  . Alcohol Use: No     Comment: occasionallly    Review of Systems  Constitutional: Positive for chills, appetite change (decreased) and fatigue. Negative for fever.  Eyes: Negative for photophobia and visual disturbance.  Respiratory: Negative for cough and shortness of breath.    Cardiovascular: Positive for leg swelling. Negative for chest pain.  Gastrointestinal: Negative for nausea, vomiting, abdominal pain, diarrhea and constipation.  Genitourinary: Negative for dysuria, frequency and decreased urine volume.  Musculoskeletal: Negative for arthralgias, back pain, gait problem and myalgias.  Skin: Positive for color change and wound.  Neurological: Negative for dizziness, syncope, light-headedness and headaches.  Psychiatric/Behavioral: Negative for confusion and agitation.  All other systems reviewed and are negative.    Allergies  Contrast media  Home Medications   Current Outpatient Rx  Name  Route  Sig  Dispense  Refill  . allopurinol (ZYLOPRIM) 300 MG tablet   Oral   Take 300 mg by mouth every morning.          Marland Kitchen ALPRAZolam (XANAX) 1 MG tablet   Oral   Take 0.5-2 mg by mouth 3 (three) times daily. Takes half tablet in the morning and at 430 pm and 2 tablets at bedtime.         Marland Kitchen aspirin EC 81 MG EC tablet   Oral   Take 1 tablet (81 mg total) by mouth daily.   100 tablet   3   . atenolol (TENORMIN) 50 MG tablet   Oral   Take 50 mg by mouth 2 (two) times daily.          . brimonidine-timolol (COMBIGAN) 0.2-0.5 % ophthalmic solution   Both Eyes   Place 1 drop into both eyes at bedtime.          . clopidogrel (PLAVIX) 75 MG tablet   Oral   Take 75 mg by mouth every morning.          . diclofenac sodium (VOLTAREN) 1 % GEL   Topical   Apply 1 application topically 4 (four) times daily as needed. Applies to both knees as needed for pain         . furosemide (LASIX) 80 MG tablet   Oral   Take 1 tablet (80 mg total) by mouth every morning.   90 tablet   3   . glipiZIDE (GLUCOTROL) 5 MG tablet   Oral   Take 1 tablet (5 mg total) by mouth 2 (two) times daily before a meal.   60 tablet   11   . HYDROcodone-acetaminophen (NORCO/VICODIN) 5-325 MG per  tablet   Oral   Take 1 tablet by mouth every 4 (four) hours as needed.    120 tablet   0   . insulin glargine (LANTUS) 100 UNIT/ML injection   Subcutaneous   Inject 10-40 Units into the skin 2 (two) times daily as needed. Per sliding scale in divided doses. May use up to 140 units/day. Usual dose is 5-10 units         . isosorbide mononitrate (IMDUR) 30 MG 24 hr tablet   Oral   Take 30 mg by mouth every morning.          . latanoprost (XALATAN) 0.005 % ophthalmic solution   Both Eyes   Place 1 drop into both eyes at bedtime.           . nitroGLYCERIN (NITROSTAT) 0.4 MG SL tablet   Sublingual   Place 0.4 mg under the tongue every 5 (five) minutes as needed. For chest pain         . omeprazole (PRILOSEC) 20 MG capsule   Oral   Take 20 mg by mouth 2 (two) times daily.          . potassium chloride SA (K-DUR,KLOR-CON) 20 MEQ tablet   Oral   Take 2 tablets (40 mEq total) by mouth 2 (two) times daily.   120 tablet   5   . simvastatin (ZOCOR) 20 MG tablet   Oral   Take 20 mg by mouth every evening.          . tamsulosin (FLOMAX) 0.4 MG CAPS capsule   Oral   Take 0.8 mg by mouth at bedtime.          . temazepam (RESTORIL) 30 MG capsule   Oral   Take 30 mg by mouth at bedtime.           BP 158/49  Pulse 58  Temp(Src) 98.8 F (37.1 C) (Oral)  Resp 20  Wt 270 lb (122.471 kg)  SpO2 100% Physical Exam  Nursing note and vitals reviewed. Constitutional: He is oriented to person, place, and time. He appears ill. No distress.  Chronically ill appearing elderly male. Laying in bed, no distress.   HENT:  Head: Normocephalic and atraumatic.  Mouth/Throat: Mucous membranes are dry.  Eyes: Conjunctivae and EOM are normal. Pupils are equal, round, and reactive to light.  Neck: Normal range of motion. Neck supple. JVD present.  Cardiovascular: Normal rate, regular rhythm and intact distal pulses.  Exam reveals distant heart sounds.   Pulmonary/Chest: Effort normal and breath sounds normal. No respiratory distress.  Abdominal: Soft.  Bowel sounds are normal. There is no tenderness. There is no rebound and no guarding.  Musculoskeletal: Normal range of motion. He exhibits no edema and no tenderness.  Significant bilateral LE edema.  4 cm ulceration to left heel. Center is necrotic with strong, malodorous smell. Surrounding redness extending to calf. No fluctuance or drainage.   Neurological: He is alert and oriented to person, place, and time. No cranial nerve deficit. Coordination normal.  Skin: Skin is warm and dry. No rash noted.  Psychiatric: He has a normal mood and affect. His behavior is normal.    ED Course  Procedures (including critical care time) Labs Review Labs Reviewed  CBC WITH DIFFERENTIAL - Abnormal; Notable for the following:    Hemoglobin 12.8 (*)    RDW 15.9 (*)    All other components within normal limits  COMPREHENSIVE METABOLIC PANEL - Abnormal; Notable for the following:  Glucose, Bld 130 (*)    Albumin 3.0 (*)    Total Bilirubin 0.2 (*)    GFR calc non Af Amer 56 (*)    GFR calc Af Amer 65 (*)    All other components within normal limits  SEDIMENTATION RATE - Abnormal; Notable for the following:    Sed Rate 44 (*)    All other components within normal limits  C-REACTIVE PROTEIN - Abnormal; Notable for the following:    CRP 11.1 (*)    All other components within normal limits  GLUCOSE, CAPILLARY - Abnormal; Notable for the following:    Glucose-Capillary 122 (*)    All other components within normal limits  MRSA CULTURE  MRSA PCR SCREENING  COMPREHENSIVE METABOLIC PANEL  CBC WITH DIFFERENTIAL  CBC   Imaging Review Dg Foot Complete Left  08/24/2013   CLINICAL DATA:  Open wound, infection  EXAM: LEFT FOOT - COMPLETE 3+ VIEW  COMPARISON:  08/28/2011  FINDINGS: Marked soft tissue swelling. Bones are osteopenic. Degenerative changes noted most pronounced of the left first MTP joint. No gross malalignment. Soft tissue ulceration noted of the calcaneal region. No definite displaced  fracture or osseous destruction by plain radiography. Soft tissue calcifications present.  IMPRESSION: Soft tissue swelling.  Soft tissue ulceration of the heel region  Diffuse osteopenia and degenerative changes  No definite plain radiographic evidence of bone loss or osteomyelitis. No fracture.   Electronically Signed   By: Daryll Brod M.D.   On: 08/24/2013 18:46    EKG Interpretation    Date/Time:    Ventricular Rate:    PR Interval:    QRS Duration:   QT Interval:    QTC Calculation:   R Axis:     Text Interpretation:              MDM  73 yo M with extensive medical history including PVD, chronic foot ulcer and DM presents with left foot infection stemming from ulceration. Appears necrotic. He is HD stable and does not appear to be systemically ill. X-ray without evidence of periosteal reaction. Inflammatory markers mildly elevated: ESR 44, CRP 11. WBC 9.4, Hgb 12.8, normal lytes. Treated with Vanc and Zosyn for foot cellulitis. Patient updated on w/u and need for admission, he was in agreement with plan. Admitted to Hospitalist for further management.   Reviewed imaging, labs and previous medical records, utilized in MDM  Discussed case with Dr. Lita Mains  Clinical Impression 1. Left foot cellulitis 2. Necrotic left foot ulceration.     Louretta Shorten, MD 08/25/13 [phone]

## 2013-08-24 NOTE — ED Notes (Signed)
Has had recurrent infections on his foot went to dr today and was sent for admission  For antiobiotics and ? Lose his foot

## 2013-08-24 NOTE — Progress Notes (Signed)
Called ED for report at 2228 [facility] Room ready and awaiting arrival of pt to unit.

## 2013-08-25 DIAGNOSIS — I70269 Atherosclerosis of native arteries of extremities with gangrene, unspecified extremity: Secondary | ICD-10-CM

## 2013-08-25 LAB — COMPREHENSIVE METABOLIC PANEL
ALT: 6 U/L (ref 0–53)
AST: 9 U/L (ref 0–37)
Albumin: 2.5 g/dL — ABNORMAL LOW (ref 3.5–5.2)
Alkaline Phosphatase: 88 U/L (ref 39–117)
BUN: 11 mg/dL (ref 6–23)
CO2: 28 meq/L (ref 19–32)
Calcium: 8.9 mg/dL (ref 8.4–10.5)
Chloride: 101 mEq/L (ref 96–112)
Creatinine, Ser: 1.03 mg/dL (ref 0.50–1.35)
GFR calc Af Amer: 82 mL/min — ABNORMAL LOW (ref >90)
GFR, EST NON AFRICAN AMERICAN: 71 mL/min — AB (ref >90)
Glucose, Bld: 119 mg/dL — ABNORMAL HIGH (ref 70–99)
POTASSIUM: 4.1 meq/L (ref 3.7–5.3)
SODIUM: 141 meq/L (ref 137–147)
TOTAL PROTEIN: 6.8 g/dL (ref 6.0–8.3)
Total Bilirubin: 0.3 mg/dL (ref 0.3–1.2)

## 2013-08-25 LAB — GLUCOSE, CAPILLARY
GLUCOSE-CAPILLARY: 150 mg/dL — AB (ref 70–99)
Glucose-Capillary: 109 mg/dL — ABNORMAL HIGH (ref 70–99)
Glucose-Capillary: 162 mg/dL — ABNORMAL HIGH (ref 70–99)

## 2013-08-25 LAB — CBC WITH DIFFERENTIAL/PLATELET
BASOS PCT: 0 % (ref 0–1)
Basophils Absolute: 0 10*3/uL (ref 0.0–0.1)
EOS ABS: 0.3 10*3/uL (ref 0.0–0.7)
Eosinophils Relative: 3 % (ref 0–5)
HEMATOCRIT: 41.6 % (ref 39.0–52.0)
HEMOGLOBIN: 12.7 g/dL — AB (ref 13.0–17.0)
Lymphocytes Relative: 24 % (ref 12–46)
Lymphs Abs: 2 10*3/uL (ref 0.7–4.0)
MCH: 25.9 pg — AB (ref 26.0–34.0)
MCHC: 30.5 g/dL (ref 30.0–36.0)
MCV: 84.7 fL (ref 78.0–100.0)
MONOS PCT: 7 % (ref 3–12)
Monocytes Absolute: 0.6 10*3/uL (ref 0.1–1.0)
Neutro Abs: 5.5 10*3/uL (ref 1.7–7.7)
Neutrophils Relative %: 66 % (ref 43–77)
Platelets: 266 10*3/uL (ref 150–400)
RBC: 4.91 MIL/uL (ref 4.22–5.81)
RDW: 15.9 % — ABNORMAL HIGH (ref 11.5–15.5)
WBC: 8.3 10*3/uL (ref 4.0–10.5)

## 2013-08-25 LAB — MRSA PCR SCREENING: MRSA by PCR: POSITIVE — AB

## 2013-08-25 MED ORDER — CHLORHEXIDINE GLUCONATE CLOTH 2 % EX PADS
6.0000 | MEDICATED_PAD | Freq: Every day | CUTANEOUS | Status: AC
  Administered 2013-08-25 – 2013-08-29 (×3): 6 via TOPICAL

## 2013-08-25 MED ORDER — MUPIROCIN 2 % EX OINT
1.0000 "application " | TOPICAL_OINTMENT | Freq: Two times a day (BID) | CUTANEOUS | Status: AC
  Administered 2013-08-25 – 2013-08-29 (×9): 1 via NASAL
  Filled 2013-08-25: qty 22

## 2013-08-25 NOTE — Progress Notes (Signed)
UR complete.  Devine Klingel RN, MSN 

## 2013-08-25 NOTE — Consult Note (Signed)
[facility] [place] CONSULT [place]   MRN : [id number]  Reason for Consult: Left heel ulcer Referring Physician: Dr. Johnella MoloneyNeville Gates  History of Present Illness: This is a 73 y.o. Male with history of chronic lower extremity edema.  He has had a chronic left heel wound with known peripheral vascular disease.  6/18/20113 Dr. Myra GianottiBrabham performed atherectomy left popliteal artery and angioplasty left popliteal artery.   The next procedure was performed on 06/02/2012 atherectomy and angioplasty, left popliteal artery.  The chronic heel ulcer has healed and worsened over time.  When he was seen in our office on 06/28/2013 the left heel ulcer was healed.  As of 1 month ago the left heel has become maloderous and the ulcer is now about 3.5 cm diameter.  He uses the left lower extremity for transfers and uses a  wheel chair for his primary mobility.   Studies : 12/21/2012  ABI: RIGHT 0.57, previous was 0.63 (12/21/12); LEFT 0.95, previous 0.89  DUPLEX SCAN OF BYPASS: Patent LLE arterial system with mixed plaque noted throughout.  Angiogram 06/02/2012:  Left Lower Extremity: The left common femoral artery is patent throughout its course as is the profunda femoral artery. The superficial femoral artery is widely patent. Again seen is a recurrent high-grade stenosis within the popliteal artery and the joint space. Three-vessel runoff is visualized down to the ankle. The anterior tibial artery is the dominant vessel across the ankle as the posterior tibial artery occludes.   We have been asked to consult secondary to the left heel ulceration.   His co-morbities include CAD, MI,DM, CHF, PAD and hypertension.  He currently takes Asprin, Atenolol, Plavix, Insulin and Zocor.     Current Facility-Administered Medications  Medication Dose Route Frequency Provider Last Rate Last Dose  . acetaminophen (TYLENOL) tablet 650 mg  650 mg Oral Q6H PRN Eduard ClosArshad N Kakrakandy, MD       Or  . acetaminophen  (TYLENOL) suppository 650 mg  650 mg Rectal Q6H PRN Eduard ClosArshad N Kakrakandy, MD      . allopurinol (ZYLOPRIM) tablet 300 mg  300 mg Oral q morning - 10a Eduard ClosArshad N Kakrakandy, MD      . ALPRAZolam Prudy Feeler(XANAX) tablet 0.5 mg  0.5 mg Oral Custom Eduard ClosArshad N Kakrakandy, MD      . ALPRAZolam Prudy Feeler(XANAX) tablet 2 mg  2 mg Oral QHS Marden Nobleobert Gates, MD   2 mg at 08/25/13 0006  . aspirin EC tablet 81 mg  81 mg Oral Daily Eduard ClosArshad N Kakrakandy, MD      . atenolol (TENORMIN) tablet 50 mg  50 mg Oral BID Eduard ClosArshad N Kakrakandy, MD   50 mg at 08/24/13 2356  . brimonidine (ALPHAGAN) 0.2 % ophthalmic solution 1 drop  1 drop Both Eyes QHS Marden Nobleobert Gates, MD   1 drop at 08/25/13 0000   And  . timolol (TIMOPTIC) 0.5 % ophthalmic solution 1 drop  1 drop Both Eyes QHS Marden Nobleobert Gates, MD   1 drop at 08/24/13 2359  . clopidogrel (PLAVIX) tablet 75 mg  75 mg Oral Q breakfast Eduard ClosArshad N Kakrakandy, MD      . furosemide (LASIX) tablet 80 mg  80 mg Oral q morning - 10a Eduard ClosArshad N Kakrakandy, MD      . glipiZIDE (GLUCOTROL) tablet 5 mg  5 mg Oral BID AC Eduard ClosArshad N Kakrakandy, MD      . HYDROcodone-acetaminophen (NORCO/VICODIN) 5-325 MG per tablet 1 tablet  1 tablet Oral Q4H PRN Eduard ClosArshad N Kakrakandy, MD  1 tablet at 08/24/13 2353  . insulin aspart (novoLOG) injection 0-9 Units  0-9 Units Subcutaneous TID WC Eduard Clos, MD      . insulin glargine (LANTUS) injection 5 Units  5 Units Subcutaneous Daily Eduard Clos, MD      . isosorbide mononitrate (IMDUR) 24 hr tablet 30 mg  30 mg Oral q morning - 10a Eduard Clos, MD      . latanoprost (XALATAN) 0.005 % ophthalmic solution 1 drop  1 drop Both Eyes QHS Eduard Clos, MD   1 drop at 08/24/13 2358  . morphine 2 MG/ML injection 1 mg  1 mg Intravenous Q4H PRN Eduard Clos, MD      . nitroGLYCERIN (NITROSTAT) SL tablet 0.4 mg  0.4 mg Sublingual Q5 min PRN Eduard Clos, MD      . ondansetron [facility]) tablet 4 mg  4 mg Oral Q6H PRN Eduard Clos, MD       Or  .  ondansetron [facility]) injection 4 mg  4 mg Intravenous Q6H PRN Eduard Clos, MD      . pantoprazole (PROTONIX) EC tablet 40 mg  40 mg Oral Daily Eduard Clos, MD      . piperacillin-tazobactam (ZOSYN) IVPB 3.375 g  3.375 g Intravenous Q8H Abran Duke, RPH   3.375 g at 08/25/13 0605  . potassium chloride SA (K-DUR,KLOR-CON) CR tablet 40 mEq  40 mEq Oral BID Eduard Clos, MD   40 mEq at 08/25/13 0007  . simvastatin (ZOCOR) tablet 20 mg  20 mg Oral QPM Eduard Clos, MD      . sodium chloride 0.9 % injection 3 mL  3 mL Intravenous Q12H Eduard Clos, MD   3 mL at 08/24/13 2315  . tamsulosin (FLOMAX) capsule 0.8 mg  0.8 mg Oral QHS Eduard Clos, MD   0.8 mg at 08/24/13 2354  . vancomycin (VANCOCIN) IVPB 1000 mg/200 mL premix  1,000 mg Intravenous Q12H Abran Duke, RPH      . zolpidem (AMBIEN) tablet 5 mg  5 mg Oral QHS Eduard Clos, MD   5 mg at 08/25/13 0008    Pt meds include: Statin :Yes Betablocker: Yes ASA: Yes Other anticoagulants/antiplatelets: Plavix  Past Medical History  Diagnosis Date  . Hypertension   . Epilepsy   . Peripheral vascular disease   . Ulcer     left foot  . CKD (chronic kidney disease), stage III   . Dyslipidemia   . Chronic venous insufficiency   . Sleep apnea   . Morbid obesity   . Diverticulosis   . Benign prostatic hypertrophy   . Glaucoma   . Staphylococcus aureus infection   . Gout   . Depression   . Insomnia   . CAD (coronary artery disease), native coronary artery   . GERD (gastroesophageal reflux disease)   . Heart murmur   . Myocardial infarction ~ 11/2011    "in ICU @ [facility]; stress-related"  . Angina   . Pneumonia 12/11/11  . Shortness of breath 12/11/11    "mostly w/exertion"  . Type II diabetes mellitus   . Chronic daily headache     "~ all the time"  . Seizures     hx; "don't know from what"  . Arthritis   . Chronic combined systolic and diastolic CHF (congestive heart failure)      a. Echo 12/2011: EF 30%, grade 1 dd, mild-mod biatrial enlargement  .  Stroke ~ 1983    "little weaker right hand"  . Recent heart attack 12/09/11    Stress heart attack  . MRSA (methicillin resistant staph aureus) culture positive Dec. 17, 2013     right heel infection  . Ischemic cardiomyopathy   . Blood in urine     per pt.'s wife  blood in urine 2 weeks ago   required catherization    Past Surgical History  Procedure Laterality Date  . Appendectomy    . Lumbar laminectomy    . Mastoid debridement    . Knee and ankle surgery  1979    "caved in in a ditch; messed up legs"; right  . Cholecystectomy    . Back surgery      "5 or 6"  . Peripheral arterial stent graft      "don't remember which side"  . Coronary artery bypass graft  1983; 1992    "CABG X 5; CABG X 8"  . Lower extremity angiogram  01/21/2012    Procedure: LOWER EXTREMITY ANGIOGRAM;  Surgeon: Nada Libman, MD;  Location: [facility] OR;  Service: Vascular;  Laterality: Bilateral;  WITH POSSIBLE INTERVENTION  . Eye surgery      "laser; both eyes"  . Eye surgery  05/08/2012    Cataract Left eye  . Heel bx  Dec. 17, 2013    Right Heel Bx.    . Transluminal atherectomy popliteal artery  01/21/2012    Left    Social History History  Substance Use Topics  . Smoking status: Former Smoker -- 1.00 packs/day for 20 years    Types: Cigarettes    Quit date: 07/29/1991  . Smokeless tobacco: Former Neurosurgeon    Types: Chew    Quit date: 08/06/1987  . Alcohol Use: No     Comment: occasionallly    Family History Family History  Problem Relation Age of Onset  . Hypertension Other   . Cancer Other   . Cancer Sister   . Deep vein thrombosis Brother   . Heart disease Brother   . Deep vein thrombosis Brother   . Heart disease Brother   . Cancer Sister     Allergies  Allergen Reactions  . Contrast Media [Iodinated Diagnostic Agents] Other (See Comments)    Shaking and feeling terrible     REVIEW OF SYSTEMS  General: [ ]   Weight loss, [ ]  Fever, [ ]  chills Neurologic: [ ]  Dizziness, [ ]  Blackouts, [ ]  Seizure [ ]  Stroke, [ ]  "Mini stroke", [ ]  Slurred speech, [ ]  Temporary blindness; [ ]  weakness in arms or legs, [ ]  Hoarseness [ ]  Dysphagia Cardiac: [ ]  Chest pain/pressure, [ ]  Shortness of breath at rest [ ]  Shortness of breath with exertion, [ ]  Atrial fibrillation or irregular heartbeat  Vascular: [ ]  Pain in legs with walking, [ ]  Pain in legs at rest, [ ]  Pain in legs at night,  [ ]  Non-healing ulcer, [ ]  Blood clot in vein/DVT,   Pulmonary: [x ] Home oxygen, [ ]  Productive cough, [ ]  Coughing up blood, [ ]  Asthma,  [ ]  Wheezing [x ] COPD Musculoskeletal:  [x ] Arthritis, [ ]  Low back pain, [x ] Joint pain Hematologic: [x ] Easy Bruising, [ ]  Anemia; [ ]  Hepatitis Gastrointestinal: [ ]  Blood in stool, [ ]  Gastroesophageal Reflux/heartburn, Urinary: [ ]  chronic Kidney disease, [ ]  on HD - [ ]  MWF or [ ]  TTHS, [ ]  Burning with urination, [ ]   Difficulty urinating Skin: [ ]  Rashes, [x ] Wounds leftb heel Psychological: [ ]  Anxiety, [ ]  Depression  Physical Examination Filed Vitals:   08/24/13 2300 08/24/13 2356 08/25/13 0500 08/25/13 0542  BP: 134/67   162/90  Pulse: 56 60  63  Temp: 97.8 F (36.6 C)   98.8 F (37.1 C)  TempSrc: Oral   Oral  Resp: 20   20  Height: 5\' 10"  (1.778 m)     Weight: 280 lb 10.3 oz (127.3 kg)  279 lb 15.8 oz (127 kg)   SpO2: 100%   100%   Body mass index is 40.17 kg/(m^2).  General:  WDWN in NAD Gait: Non ambulatory HENT: WNL Eyes: Pupils equal Pulmonary: normal non-labored breathing , without Rales, rhonchi,  wheezing Cardiac: RRR, without  Murmurs, rubs or gallops; No carotid bruits Abdomen: soft, NT, no masses +BS Skin: no rashes, ulcers noted left heel lateral and plantar;  positive Gangrene , Sever edema bilateral feet and ankles.  Left Foot and ankle are erythematous.  Vascular Exam/Pulses:Palpate femoral pulses bilateral.  No palpable pulses below the  femoral.  Doppler not available.   Musculoskeletal: no muscle wasting or atrophy;  Neurologic: A&O X 3; Appropriate Affect ;  SENSATION: normal; MOTOR FUNCTION: 5/5 Symmetric bilateral upper extremities. Speech is fluent/normal   Significant Diagnostic Studies: CBC Lab Results  Component Value Date   WBC 8.3 08/25/2013   HGB 12.7* 08/25/2013   HCT 41.6 08/25/2013   MCV 84.7 08/25/2013   PLT 266 08/25/2013    BMET    Component Value Date/Time   NA 141 08/25/2013 0425   K 4.1 08/25/2013 0425   CL 101 08/25/2013 0425   CO2 28 08/25/2013 0425   GLUCOSE 119* 08/25/2013 0425   BUN 11 08/25/2013 0425   CREATININE 1.03 08/25/2013 0425   CALCIUM 8.9 08/25/2013 0425   GFRNONAA 71* 08/25/2013 0425   GFRAA 82* 08/25/2013 0425   Estimated Creatinine Clearance: 88 ml/min (by C-G formula based on Cr of 1.03).  COAG Lab Results  Component Value Date   INR 1.08 05/30/2013   INR 1.03 05/01/2013   INR 0.93 04/30/2013   Foot x ray shows  No definite plain radiographic evidence of bone loss or  osteomyelitis. No fracture  Non-Invasive Vascular Imaging:  Studies : 12/21/2012  ABI: RIGHT 0.57, previous was 0.63 (12/21/12); LEFT 0.95, previous 0.89    ASSESSMENT/PLAN:  This is previously undergone treatment the atherectomy and angioplasty. This is been fixed for her long-term ulceration of the left foot. He is not an operative candidate This ulcer may lead to an amputation BKA.  Continue antibiotics for now.  Clinton Gallant [facility] 08/25/2013 10:23 AM Agree with above assessment Patient has extensive necrotic heel ulcer with cellulitis and to distal calf-quite malodorous No potential for healing of this ulcer and patient is not candidate for revascularization Patient was admitted in heart failure several weeks ago and had nonstemi--- followed by Dr. Viann Fish The patient needs left BKA versus AKA  Discuss this with Dr. Donnie Aho today patient is not candidate for any further cardiac  intervention-he will see in consultation tomorrow and we will schedule for left leg amputation on Friday morning

## 2013-08-25 NOTE — Progress Notes (Addendum)
Subjective: Appreciate help from the hospitalist admission yesterday of Mr. Jungbluth. He has multiple medical issues and recurrent ulcers of the peripheral vascular disease. Left ulcer needs very aggressive therapy and he also needs further evaluation to assess for adequate arterial flow to the lower extremity. He has been seen by Dr. Trula Slade in the past. [facility] consult vascular surgery as well as wound care  Objective: Weight change:  No intake or output data in the 24 hours ending 08/25/13 0732 Filed Vitals:   08/24/13 2300 08/24/13 2356 08/25/13 0500 08/25/13 0542  BP: 134/67   162/90  Pulse: 56 60  63  Temp: 97.8 F (36.6 C)   98.8 F (37.1 C)  TempSrc: Oral   Oral  Resp: 20   20  Height: $Remove'5\' 10"'[id number]$  (1.778 m)     Weight: 127.3 kg (280 lb 10.3 oz)  127 kg (279 lb 15.8 oz)   SpO2: 100%   100%   General: Well-developed and nourished.   Cardiovascular: S1-S2 heard.  Respiratory: No rhonchi or crepitations.  Abdomen: Soft nontender bowel sounds present.  Skin: Patient has a large left heel ulcer measuring around 5 cm diameter with black dark base with some discharge. There is also erythema of the left leg with warmth.  Musculoskeletal: Has chronic left lower extremity edema with marked deformity of the right knee and feet bilaterally Psychiatric: Appears normal.  Neurologic: Alert awake oriented to time place and person. Moves all extremities. 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   Lab Results: Results for orders placed during the hospital encounter of 08/24/13 (from the past 48 hour(s))  CBC WITH DIFFERENTIAL     Status: Abnormal   Collection Time    08/24/13  1:04 PM      Result Value Range   WBC 9.4  4.0 - 10.5 K/uL   RBC 4.87  4.22 - 5.81 MIL/uL   Hemoglobin 12.8 (*) 13.0 - 17.0 g/dL   HCT 41.6  39.0 - 52.0 %   MCV 85.4  78.0 - 100.0 fL   MCH 26.3  26.0 - 34.0 pg   MCHC 30.8  30.0 - 36.0 g/dL   RDW  15.9 (*) 11.5 - 15.5 %   Platelets 290  150 - 400 K/uL   Neutrophils Relative % 60  43 - 77 %   Neutro Abs 5.6  1.7 - 7.7 K/uL   Lymphocytes Relative 29  12 - 46 %   Lymphs Abs 2.7  0.7 - 4.0 K/uL   Monocytes Relative 9  3 - 12 %   Monocytes Absolute 0.8  0.1 - 1.0 K/uL   Eosinophils Relative 2  0 - 5 %   Eosinophils Absolute 0.2  0.0 - 0.7 K/uL   Basophils Relative 0  0 - 1 %   Basophils Absolute 0.0  0.0 - 0.1 K/uL  COMPREHENSIVE METABOLIC PANEL     Status: Abnormal   Collection Time    08/24/13  1:04 PM      Result Value Range   Sodium 143  137 - 147 mEq/L   Potassium 4.4  3.7 - 5.3 mEq/L   Chloride 100  96 - 112 mEq/L   CO2 29  19 - 32 mEq/L   Glucose, Bld 130 (*) 70 - 99 mg/dL   BUN 14  6 - 23 mg/dL   Creatinine, Ser 1.26  0.50 - 1.35 mg/dL   Calcium 9.1  8.4 - 10.5  mg/dL   Total Protein 7.4  6.0 - 8.3 g/dL   Albumin 3.0 (*) 3.5 - 5.2 g/dL   AST 9  0 - 37 U/L   ALT 6  0 - 53 U/L   Alkaline Phosphatase 96  39 - 117 U/L   Total Bilirubin 0.2 (*) 0.3 - 1.2 mg/dL   GFR calc non Af Amer 56 (*) >90 mL/min   GFR calc Af Amer 65 (*) >90 mL/min   Comment: (NOTE)     The eGFR has been calculated using the CKD EPI equation.     This calculation has not been validated in all clinical situations.     eGFR's persistently <90 mL/min signify possible Chronic Kidney     Disease.  SEDIMENTATION RATE     Status: Abnormal   Collection Time    08/24/13  5:27 PM      Result Value Range   Sed Rate 44 (*) 0 - 16 mm/hr  C-REACTIVE PROTEIN     Status: Abnormal   Collection Time    08/24/13  5:27 PM      Result Value Range   CRP 11.1 (*) <0.60 mg/dL   Comment: Performed at [facility]  GLUCOSE, CAPILLARY     Status: Abnormal   Collection Time    08/24/13 11:13 PM      Result Value Range   Glucose-Capillary 122 (*) 70 - 99 mg/dL   Comment 1 Documented in Chart     Comment 2 Notify RN    MRSA PCR SCREENING     Status: Abnormal   Collection Time    08/24/13 11:50 PM       Result Value Range   MRSA by PCR POSITIVE (*) NEGATIVE   Comment:            The GeneXpert MRSA Assay (FDA     approved for NASAL specimens     only), is one component of a     comprehensive MRSA colonization     surveillance program. It is not     intended to diagnose MRSA     infection nor to guide or     monitor treatment for     MRSA infections.     RESULT CALLED TO, READ BACK BY AND VERIFIED WITH:     [place],K RN [id number] AT 0241 SKEEN,P  COMPREHENSIVE METABOLIC PANEL     Status: Abnormal   Collection Time    08/25/13  4:25 AM      Result Value Range   Sodium 141  137 - 147 mEq/L   Potassium 4.1  3.7 - 5.3 mEq/L   Chloride 101  96 - 112 mEq/L   CO2 28  19 - 32 mEq/L   Glucose, Bld 119 (*) 70 - 99 mg/dL   BUN 11  6 - 23 mg/dL   Creatinine, Ser 3.09  0.50 - 1.35 mg/dL   Calcium 8.9  8.4 - 40.7 mg/dL   Total Protein 6.8  6.0 - 8.3 g/dL   Albumin 2.5 (*) 3.5 - 5.2 g/dL   AST 9  0 - 37 U/L   ALT 6  0 - 53 U/L   Alkaline Phosphatase 88  39 - 117 U/L   Total Bilirubin 0.3  0.3 - 1.2 mg/dL   GFR calc non Af Amer 71 (*) >90 mL/min   GFR calc Af Amer 82 (*) >90 mL/min   Comment: (NOTE)     The eGFR has been calculated using  the CKD EPI equation.     This calculation has not been validated in all clinical situations.     eGFR's persistently <90 mL/min signify possible Chronic Kidney     Disease.  CBC WITH DIFFERENTIAL     Status: Abnormal   Collection Time    08/25/13  4:25 AM      Result Value Range   WBC 8.3  4.0 - 10.5 K/uL   RBC 4.91  4.22 - 5.81 MIL/uL   Hemoglobin 12.7 (*) 13.0 - 17.0 g/dL   HCT 41.6  39.0 - 52.0 %   MCV 84.7  78.0 - 100.0 fL   MCH 25.9 (*) 26.0 - 34.0 pg   MCHC 30.5  30.0 - 36.0 g/dL   RDW 15.9 (*) 11.5 - 15.5 %   Platelets 266  150 - 400 K/uL   Neutrophils Relative % 66  43 - 77 %   Neutro Abs 5.5  1.7 - 7.7 K/uL   Lymphocytes Relative 24  12 - 46 %   Lymphs Abs 2.0  0.7 - 4.0 K/uL   Monocytes Relative 7  3 - 12 %   Monocytes Absolute 0.6   0.1 - 1.0 K/uL   Eosinophils Relative 3  0 - 5 %   Eosinophils Absolute 0.3  0.0 - 0.7 K/uL   Basophils Relative 0  0 - 1 %   Basophils Absolute 0.0  0.0 - 0.1 K/uL  GLUCOSE, CAPILLARY     Status: Abnormal   Collection Time    08/25/13  6:38 AM      Result Value Range   Glucose-Capillary 109 (*) 70 - 99 mg/dL   Comment 1 Documented in Chart     Comment 2 Notify RN      Studies/Results: Dg Foot Complete Left  08/24/2013   CLINICAL DATA:  Open wound, infection  EXAM: LEFT FOOT - COMPLETE 3+ VIEW  COMPARISON:  08/28/2011  FINDINGS: Marked soft tissue swelling. Bones are osteopenic. Degenerative changes noted most pronounced of the left first MTP joint. No gross malalignment. Soft tissue ulceration noted of the calcaneal region. No definite displaced fracture or osseous destruction by plain radiography. Soft tissue calcifications present.  IMPRESSION: Soft tissue swelling.  Soft tissue ulceration of the heel region  Diffuse osteopenia and degenerative changes  No definite plain radiographic evidence of bone loss or osteomyelitis. No fracture.   Electronically Signed   By: Daryll Brod M.D.   On: 08/24/2013 18:46   Medications: Scheduled Meds: . allopurinol  300 mg Oral q morning - 10a  . ALPRAZolam  0.5 mg Oral Custom  . ALPRAZolam  2 mg Oral QHS  . aspirin EC  81 mg Oral Daily  . atenolol  50 mg Oral BID  . brimonidine  1 drop Both Eyes QHS   And  . timolol  1 drop Both Eyes QHS  . clopidogrel  75 mg Oral Q breakfast  . furosemide  80 mg Oral q morning - 10a  . glipiZIDE  5 mg Oral BID AC  . insulin aspart  0-9 Units Subcutaneous TID WC  . insulin glargine  5 Units Subcutaneous Daily  . isosorbide mononitrate  30 mg Oral q morning - 10a  . latanoprost  1 drop Both Eyes QHS  . pantoprazole  40 mg Oral Daily  . piperacillin-tazobactam (ZOSYN)  IV  3.375 g Intravenous Q8H  . potassium chloride SA  40 mEq Oral BID  . simvastatin  20 mg Oral QPM  .  sodium chloride  3 mL Intravenous  Q12H  . tamsulosin  0.8 mg Oral QHS  . vancomycin  1,000 mg Intravenous Q12H  . zolpidem  5 mg Oral QHS   Continuous Infusions:  PRN Meds:.acetaminophen, acetaminophen, HYDROcodone-acetaminophen, morphine injection, nitroGLYCERIN, ondansetron (ZOFRAN) IV, ondansetron  Assessment/Plan: Principal Problem:   Cellulitis of foot Active Problems:   Type 2 diabetes mellitus with vascular disease   Hypertensive heart disease without CHF   PAD (peripheral artery disease)   CAD (coronary artery disease), native coronary artery  1. Infected left foot ulcer with cellulitis - patient's left foot ulcer looks gangrenous. Continue vancomycin and Zosyn. Followup on MRI of the left foot to check for any osteomyelitis and have consulted wound team. Consult vascular surgery. 2. Diabetes mellitus type 2 - continue Lantus and glipizide closely follow CBGs with sliding-scale coverage. 3. Peripheral arterial disease status post stenting - continue antiplatelet agents. See #1. 4. History of CAD status post CABG - denies any chest pain. 5. History of ischemic cardiomyopathy last EF was 30% - continue Lasix and closely follow intake output. 6. History of OSA noncompliant with CPAP. 7. Sacral decubitus ulcer - wound team has been consulted. 8. Psychiatric history - stable on current medications 9. MRSA positive nasal swab 10. No CODE BLUE   LOS: 1 day   Henrine Screws, MD 08/25/2013, 7:32 AM

## 2013-08-25 NOTE — Consult Note (Addendum)
WOC wound consult note Reason for Consult: Left heel ulcer (+) gangrene and chronic sacral pressure ulcer Wound type: Gangrenous wound left heel and plantar surface of foot.  Scheduled for amputation on 2 days.  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  Scar tissue present causes skin to skin contact and some moisture.  Uses barrier cream at home.  Pressure Ulcer POA: Yes Measurement: Left foot 16 cm x 8 cm 100% brown/black devitalized tissue in wound bed.  Foul odor.  Sacrum fully epithelialized, but moist at times from perspiration while in bed. Home treatment is Desitin cream.  Dressing procedure/placement/frequency: Dry dressing daily to left heel until surgery on Friday.  Apply sacral silicone border Allevyn foam to sacrum.  Change every 3 days and PRN soilage.   Will not follow at this time.  Please re-consult if needed.  Maple HudsonKaren Kelleigh Skerritt RN BSN CWON Pager [phone]

## 2013-08-26 ENCOUNTER — Encounter (HOSPITAL_COMMUNITY): Payer: Self-pay | Admitting: Cardiology

## 2013-08-26 ENCOUNTER — Inpatient Hospital Stay (HOSPITAL_COMMUNITY): Payer: Medicare Other

## 2013-08-26 DIAGNOSIS — A4902 Methicillin resistant Staphylococcus aureus infection, unspecified site: Secondary | ICD-10-CM | POA: Diagnosis present

## 2013-08-26 DIAGNOSIS — Z8673 Personal history of transient ischemic attack (TIA), and cerebral infarction without residual deficits: Secondary | ICD-10-CM | POA: Insufficient documentation

## 2013-08-26 LAB — GLUCOSE, CAPILLARY
Glucose-Capillary: 101 mg/dL — ABNORMAL HIGH (ref 70–99)
Glucose-Capillary: 88 mg/dL (ref 70–99)
Glucose-Capillary: 152 mg/dL — ABNORMAL HIGH (ref 70–99)
Glucose-Capillary: 112 mg/dL — ABNORMAL HIGH (ref 70–99)

## 2013-08-26 LAB — PRO B NATRIURETIC PEPTIDE: Pro B Natriuretic peptide (BNP): 918.5 pg/mL — ABNORMAL HIGH (ref 0–125)

## 2013-08-26 NOTE — Consult Note (Signed)
Caliope Ruppert SN [employer] Andrez Grime/SWilson EdD

## 2013-08-26 NOTE — Progress Notes (Signed)
Subjective: Appreciate vascular surgery consult. Amputation is almost certainly the only alternative here. He has had multiple ulcers and multiple hospitalizations secondary to these and they continued to not heal well. Documented severe peripheral vascular disease.  Objective: Weight change:   Intake/Output Summary (Last 24 hours) at 08/26/13 0741 Last data filed at 08/26/13 0700  Gross per 24 hour  Intake    720 ml  Output   1600 ml  Net   -880 ml   Filed Vitals:   08/25/13 0542 08/25/13 1121 08/25/13 1400 08/25/13 1803  BP: 162/90 148/50 127/54 134/57  Pulse: 63 62 59 65  Temp: 98.8 F (37.1 C) 102 F (38.9 C) 98.4 F (36.9 C) 98 F (36.7 C)  TempSrc: Oral Oral Oral Oral  Resp: $Remo'20 20 20 20  'mwyYS$ Height:      Weight:      SpO2: 100% 100% 98% 99%   General: Well-developed and nourished.  Cardiovascular: S1-S2 heard.  Respiratory: No rhonchi or crepitations.  Abdomen: Soft nontender bowel sounds present.  Skin: Patient has a large left heel ulcer measuring around 16 x 8 cm diameter with black dark base with some discharge. There is also erythema of the left leg with warmth.  Musculoskeletal: Has chronic left lower extremity edema with marked deformity of the right knee and feet bilaterally  Psychiatric: Appears normal.  Neurologic: Alert awake oriented to time place and person. Moves all extremities. 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   Lab Results: Results for orders placed during the hospital encounter of 08/24/13 (from the past 48 hour(s))  CBC WITH DIFFERENTIAL     Status: Abnormal   Collection Time    08/24/13  1:04 PM      Result Value Range   WBC 9.4  4.0 - 10.5 K/uL   RBC 4.87  4.22 - 5.81 MIL/uL   Hemoglobin 12.8 (*) 13.0 - 17.0 g/dL   HCT 41.6  39.0 - 52.0 %   MCV 85.4  78.0 - 100.0 fL   MCH 26.3  26.0 - 34.0 pg   MCHC 30.8  30.0 - 36.0 g/dL   RDW 15.9 (*) 11.5 - 15.5 %   Platelets  290  150 - 400 K/uL   Neutrophils Relative % 60  43 - 77 %   Neutro Abs 5.6  1.7 - 7.7 K/uL   Lymphocytes Relative 29  12 - 46 %   Lymphs Abs 2.7  0.7 - 4.0 K/uL   Monocytes Relative 9  3 - 12 %   Monocytes Absolute 0.8  0.1 - 1.0 K/uL   Eosinophils Relative 2  0 - 5 %   Eosinophils Absolute 0.2  0.0 - 0.7 K/uL   Basophils Relative 0  0 - 1 %   Basophils Absolute 0.0  0.0 - 0.1 K/uL  COMPREHENSIVE METABOLIC PANEL     Status: Abnormal   Collection Time    08/24/13  1:04 PM      Result Value Range   Sodium 143  137 - 147 mEq/L   Potassium 4.4  3.7 - 5.3 mEq/L   Chloride 100  96 - 112 mEq/L   CO2 29  19 - 32 mEq/L   Glucose, Bld 130 (*) 70 - 99 mg/dL   BUN 14  6 - 23 mg/dL   Creatinine, Ser 1.26  0.50 - 1.35 mg/dL   Calcium 9.1  8.4 - 10.5 mg/dL   Total Protein  7.4  6.0 - 8.3 g/dL   Albumin 3.0 (*) 3.5 - 5.2 g/dL   AST 9  0 - 37 U/L   ALT 6  0 - 53 U/L   Alkaline Phosphatase 96  39 - 117 U/L   Total Bilirubin 0.2 (*) 0.3 - 1.2 mg/dL   GFR calc non Af Amer 56 (*) >90 mL/min   GFR calc Af Amer 65 (*) >90 mL/min   Comment: (NOTE)     The eGFR has been calculated using the CKD EPI equation.     This calculation has not been validated in all clinical situations.     eGFR's persistently <90 mL/min signify possible Chronic Kidney     Disease.  SEDIMENTATION RATE     Status: Abnormal   Collection Time    08/24/13  5:27 PM      Result Value Range   Sed Rate 44 (*) 0 - 16 mm/hr  C-REACTIVE PROTEIN     Status: Abnormal   Collection Time    08/24/13  5:27 PM      Result Value Range   CRP 11.1 (*) <0.60 mg/dL   Comment: Performed at [facility], [facility]     Status: Abnormal   Collection Time    08/24/13 11:13 PM      Result Value Range   Glucose-Capillary 122 (*) 70 - 99 mg/dL   Comment 1 Documented in Chart     Comment 2 Notify RN    MRSA PCR SCREENING     Status: Abnormal   Collection Time    08/24/13 11:50 PM      Result Value Range   MRSA by PCR  POSITIVE (*) NEGATIVE   Comment:            The GeneXpert MRSA Assay (FDA     approved for NASAL specimens     only), is one component of a     comprehensive MRSA colonization     surveillance program. It is not     intended to diagnose MRSA     infection nor to guide or     monitor treatment for     MRSA infections.     RESULT CALLED TO, READ BACK BY AND VERIFIED WITH:     [place],K RN [id number] AT [address],P  COMPREHENSIVE METABOLIC PANEL     Status: Abnormal   Collection Time    08/25/13  4:25 AM      Result Value Range   Sodium 141  137 - 147 mEq/L   Potassium 4.1  3.7 - 5.3 mEq/L   Chloride 101  96 - 112 mEq/L   CO2 28  19 - 32 mEq/L   Glucose, Bld 119 (*) 70 - 99 mg/dL   BUN 11  6 - 23 mg/dL   Creatinine, Ser 1.03  0.50 - 1.35 mg/dL   Calcium 8.9  8.4 - 10.5 mg/dL   Total Protein 6.8  6.0 - 8.3 g/dL   Albumin 2.5 (*) 3.5 - 5.2 g/dL   AST 9  0 - 37 U/L   ALT 6  0 - 53 U/L   Alkaline Phosphatase 88  39 - 117 U/L   Total Bilirubin 0.3  0.3 - 1.2 mg/dL   GFR calc non Af Amer 71 (*) >90 mL/min   GFR calc Af Amer 82 (*) >90 mL/min   Comment: (NOTE)     The eGFR has been calculated using the CKD EPI equation.  This calculation has not been validated in all clinical situations.     eGFR's persistently <90 mL/min signify possible Chronic Kidney     Disease.  CBC WITH DIFFERENTIAL     Status: Abnormal   Collection Time    08/25/13  4:25 AM      Result Value Range   WBC 8.3  4.0 - 10.5 K/uL   RBC 4.91  4.22 - 5.81 MIL/uL   Hemoglobin 12.7 (*) 13.0 - 17.0 g/dL   HCT 58.0  63.8 - 68.5 %   MCV 84.7  78.0 - 100.0 fL   MCH 25.9 (*) 26.0 - 34.0 pg   MCHC 30.5  30.0 - 36.0 g/dL   RDW 48.8 (*) 30.1 - 41.5 %   Platelets 266  150 - 400 K/uL   Neutrophils Relative % 66  43 - 77 %   Neutro Abs 5.5  1.7 - 7.7 K/uL   Lymphocytes Relative 24  12 - 46 %   Lymphs Abs 2.0  0.7 - 4.0 K/uL   Monocytes Relative 7  3 - 12 %   Monocytes Absolute 0.6  0.1 - 1.0 K/uL   Eosinophils  Relative 3  0 - 5 %   Eosinophils Absolute 0.3  0.0 - 0.7 K/uL   Basophils Relative 0  0 - 1 %   Basophils Absolute 0.0  0.0 - 0.1 K/uL  GLUCOSE, CAPILLARY     Status: Abnormal   Collection Time    08/25/13  6:38 AM      Result Value Range   Glucose-Capillary 109 (*) 70 - 99 mg/dL   Comment 1 Documented in Chart     Comment 2 Notify RN    GLUCOSE, CAPILLARY     Status: Abnormal   Collection Time    08/25/13 11:14 AM      Result Value Range   Glucose-Capillary 150 (*) 70 - 99 mg/dL  GLUCOSE, CAPILLARY     Status: Abnormal   Collection Time    08/25/13  4:29 PM      Result Value Range   Glucose-Capillary 162 (*) 70 - 99 mg/dL    Studies/Results: Dg Foot Complete Left  08/24/2013   CLINICAL DATA:  Open wound, infection  EXAM: LEFT FOOT - COMPLETE 3+ VIEW  COMPARISON:  08/28/2011  FINDINGS: Marked soft tissue swelling. Bones are osteopenic. Degenerative changes noted most pronounced of the left first MTP joint. No gross malalignment. Soft tissue ulceration noted of the calcaneal region. No definite displaced fracture or osseous destruction by plain radiography. Soft tissue calcifications present.  IMPRESSION: Soft tissue swelling.  Soft tissue ulceration of the heel region  Diffuse osteopenia and degenerative changes  No definite plain radiographic evidence of bone loss or osteomyelitis. No fracture.   Electronically Signed   By: Ruel Favors M.D.   On: 08/24/2013 18:46   Medications: Scheduled Meds: . allopurinol  300 mg Oral q morning - 10a  . ALPRAZolam  0.5 mg Oral Custom  . ALPRAZolam  2 mg Oral QHS  . aspirin EC  81 mg Oral Daily  . atenolol  50 mg Oral BID  . brimonidine  1 drop Both Eyes QHS   And  . timolol  1 drop Both Eyes QHS  . Chlorhexidine Gluconate Cloth  6 each Topical Q0600  . clopidogrel  75 mg Oral Q breakfast  . furosemide  80 mg Oral q morning - 10a  . glipiZIDE  5 mg Oral BID AC  . insulin  aspart  0-9 Units Subcutaneous TID WC  . insulin glargine  5 Units  Subcutaneous Daily  . isosorbide mononitrate  30 mg Oral q morning - 10a  . latanoprost  1 drop Both Eyes QHS  . mupirocin ointment  1 application Nasal BID  . pantoprazole  40 mg Oral Daily  . piperacillin-tazobactam (ZOSYN)  IV  3.375 g Intravenous Q8H  . potassium chloride SA  40 mEq Oral BID  . simvastatin  20 mg Oral QPM  . sodium chloride  3 mL Intravenous Q12H  . tamsulosin  0.8 mg Oral QHS  . vancomycin  1,000 mg Intravenous Q12H  . zolpidem  5 mg Oral QHS   Continuous Infusions:  PRN Meds:.acetaminophen, acetaminophen, HYDROcodone-acetaminophen, morphine injection, nitroGLYCERIN, ondansetron (ZOFRAN) IV, ondansetron  Assessment/Plan: Principal Problem:   Cellulitis of foot Active Problems:   Type 2 diabetes mellitus with vascular disease   Hypertensive heart disease without CHF   PAD (peripheral artery disease)   CAD (coronary artery disease), native coronary artery  1. Infected left foot ulcer with cellulitis - patient's left foot ulcer looks gangrenous. Continue vancomycin and Zosyn. For amputation tomorrow 2. Diabetes mellitus type 2 - continue Lantus and glipizide closely follow CBGs with sliding-scale coverage. 3. Peripheral arterial disease status post stenting - continue antiplatelet agents. See #1. 4. History of CAD status post CABG - denies any chest pain. 5. History of ischemic cardiomyopathy last EF was 30% - continue Lasix and closely follow intake output. 6. History of OSA noncompliant with CPAP. 7. Sacral decubitus ulcer - wound team has been consulted and orders written, stable 8. Psychiatric history - stable on current medications 9. MRSA positive nasal swab 10. No CODE BLUE   LOS: 2 days   Henrine Screws, MD 08/26/2013, 7:41 AM

## 2013-08-26 NOTE — ED Provider Notes (Signed)
I saw and evaluated the patient, reviewed the resident's note and I agree with the findings and plan.  EKG Interpretation    Date/Time:    Ventricular Rate:    PR Interval:    QRS Duration:   QT Interval:    QTC Calculation:   R Axis:     Text Interpretation:              Patient with multiple comorbidities presents with left foot ulcer and increasing left lower your edema and swelling. Vital signs stable. Patient started on broad-spectrum antibiotics. Admitted to the medicine service.  Amaris Delafuente YelverLoren Racerton, MD 08/26/13 [phone]

## 2013-08-26 NOTE — Progress Notes (Signed)
Patient ID: Nicholas [name], male   DOB: Dec 10, 1940, 73 y.o.   MRN: [id number] Vascular Surgery Progress Note  Subjective: Gangrene left foot-non-reconstructable vascular disease  Objective:  Filed Vitals:   08/26/13 0831  BP: 140/45  Pulse: 56  Temp: 96 F (35.6 C)  Resp: 22    General alert and oriented x3 Left [place] unchanged from exam yesterday with gangrenous ulcer left heel   Labs:  Recent Labs Lab 08/24/13 1304 08/25/13 0425  CREATININE 1.26 1.03    Recent Labs Lab 08/24/13 1304 08/25/13 0425  NA 143 141  K 4.4 4.1  CL 100 101  CO2 29 28  BUN 14 11  CREATININE 1.26 1.03  GLUCOSE 130* 119*  CALCIUM 9.1 8.9    Recent Labs Lab 08/24/13 1304 08/25/13 0425  WBC 9.4 8.3  HGB 12.8* 12.7*  HCT 41.6 41.6  PLT 290 266   No results found for this basename: INR,  in the last 168 hours  I/O last 3 completed shifts: In: 720 [P.O.:720] Out: 1600 [Urine:1600]  Imaging: Dg Foot Complete Left  08/24/2013   CLINICAL DATA:  Open wound, infection  EXAM: LEFT FOOT - COMPLETE 3+ VIEW  COMPARISON:  08/28/2011  FINDINGS: Marked soft tissue swelling. Bones are osteopenic. Degenerative changes noted most pronounced of the left first MTP joint. No gross malalignment. Soft tissue ulceration noted of the calcaneal region. No definite displaced fracture or osseous destruction by plain radiography. Soft tissue calcifications present.  IMPRESSION: Soft tissue swelling.  Soft tissue ulceration of the heel region  Diffuse osteopenia and degenerative changes  No definite plain radiographic evidence of bone loss or osteomyelitis. No fracture.   Electronically Signed   By: Ruel Favorsrevor  Shick M.D.   On: 08/24/2013 18:46    Assessment/Plan:   LOS: 2 days  s/p Procedure(s): AMPUTATION BELOW KNEE-LEFT BELOW KNEE AMP VS ABOVE KNEE AMPUTATION   Plan left leg amputation tomorrow either AKA or BKA-discussed with patient and he understands Appreciate Dr.Tilley's input   Nicholas GipJames Courtne Lighty,  MD 08/26/2013 11:29 AM

## 2013-08-26 NOTE — Consult Note (Signed)
Cardiology Consult Note  Admit date: 08/24/2013 Name: Nicholas [name] 73 y.o.  male DOB:  Dec 12, 1940 MRN:  [id number]  Today's date:  08/26/2013  Referring Physician:    Dr. Josephina Gip  Primary Physician:   Dr. Marden Noble  Reason for Consultation:  Preoperative cardiac evaluation  IMPRESSIONS: 1. From a cardiovascular viewpoint may proceed with the planned amputation. The patient has had increased risk of complications from anesthesia due to his morbid obesity, multiple comorbidities. His cardiovascular status is currently stable however. His most previous echocardiogram showed a normal ejection fraction. He does have evidence of diastolic heart failure but he currently appears compensated. 2. Coronary artery disease with previous redo bypass grafting who is currently not a candidate for repeat cardiac intervention 3. Hypertensive heart disease 4. Diabetes mellitus with vascular complications 5. Morbid obesity 6. Gangrenous left foot 7. Severe peripheral vascular disease  RECOMMENDATION: Need to review an EKG and a chest x-ray during this admission. He should continue his usual cardiac medications during the time of surgery. The patient explicitly stated that he does not wish resuscitation in the event of a cardiac arrest. I will change the CODE STATUS listed on the chart and also explained to him that during a surgical procedure that he would be a full code and then but after the procedure this would revert to the previous wishes that he expressed to be a DO NOT RESUSCITATE.  HISTORY: This 73 year old male is well-known to me he has a history of coronary artery disease with bypass graft initially in 1992 and redo bypass grafting in  2003. Is not felt to be a candidate for repeat surgery. He has had significant sepsis syndrome in the past and is also has had acute respiratory failure. His most recent admission was in October 2014 at which point in time he had severe respiratory failure  and congestive heart failure when he stopped taking his furosemide on his own at home. He also had gross hematuria and urinary tract infection and recurrence a lot his admissions over the past couple of years. He now is admitted with a gangrenous necrotic foot and is in need of an amputation. He uses nitroglycerin intermittently at home. He feels as if his breathing has almost improved to his previous level since his discharge in October. He does not have PND orthopnea. He is not able to walk and basically he leads a sedentary existence either in bed or up in a chair. He does have significant complications of diabetes and also has a history of a prior stroke in the past for which he has made a reasonable recovery.  Past Medical History  Diagnosis Date  . Hypertension   . Epilepsy   . Peripheral vascular disease   . Ulcer     left foot  . CKD (chronic kidney disease), stage III   . Dyslipidemia   . Chronic venous insufficiency   . Sleep apnea   . Morbid obesity   . Diverticulosis   . Benign prostatic hypertrophy   . Glaucoma   . Staphylococcus aureus infection   . Gout   . Depression   . Insomnia   . CAD (coronary artery disease), native coronary artery   . GERD (gastroesophageal reflux disease)   . Heart murmur   . Myocardial infarction ~ 11/2011    "in ICU @ [facility]; stress-related"  . Angina   . Pneumonia 12/11/11  . Shortness of breath 12/11/11    "mostly w/exertion"  . Type II  diabetes mellitus   . Chronic daily headache     "~ all the time"  . Seizures     hx; "don't know from what"  . Arthritis   . Chronic combined systolic and diastolic CHF (congestive heart failure)     a. Echo 12/2011: EF 30%, grade 1 dd, mild-mod biatrial enlargement  . Stroke ~ 1983    "little weaker right hand"  . Recent heart attack 12/09/11    Stress heart attack  . MRSA (methicillin resistant staph aureus) culture positive Dec. 17, 2013     right heel infection  . Ischemic cardiomyopathy   .  Blood in urine     per pt.'s wife  blood in urine 2 weeks ago   required catherization      Past Surgical History  Procedure Laterality Date  . Appendectomy    . Lumbar laminectomy    . Mastoid debridement    . Knee and ankle surgery  1979    "caved in in a ditch; messed up legs"; right  . Cholecystectomy    . Back surgery      "5 or 6"  . Peripheral arterial stent graft      "don't remember which side"  . Coronary artery bypass graft  1983; 1992    "CABG X 5; CABG X 8"  . Lower extremity angiogram  01/21/2012    Procedure: LOWER EXTREMITY ANGIOGRAM;  Surgeon: Nada Libman, MD;  Location: [facility] OR;  Service: Vascular;  Laterality: Bilateral;  WITH POSSIBLE INTERVENTION  . Eye surgery      "laser; both eyes"  . Eye surgery  05/08/2012    Cataract Left eye  . Heel bx  Dec. 17, 2013    Right Heel Bx.    . Transluminal atherectomy popliteal artery  01/21/2012    Left     Allergies:  is allergic to contrast media.   Medications: Prior to Admission medications   Medication Sig Start Date End Date Taking? Authorizing Provider  allopurinol (ZYLOPRIM) 300 MG tablet Take 300 mg by mouth every morning.    Yes Historical Provider, MD  ALPRAZolam Prudy Feeler) 1 MG tablet Take 0.5-2 mg by mouth 3 (three) times daily. Takes half tablet in the morning and at 430 pm and 2 tablets at bedtime.   Yes Historical Provider, MD  aspirin EC 81 MG EC tablet Take 1 tablet (81 mg total) by mouth daily. 06/03/13  Yes Marden Noble, MD  atenolol (TENORMIN) 50 MG tablet Take 50 mg by mouth 2 (two) times daily.    Yes Historical Provider, MD  brimonidine-timolol (COMBIGAN) 0.2-0.5 % ophthalmic solution Place 1 drop into both eyes at bedtime.    Yes Historical Provider, MD  clopidogrel (PLAVIX) 75 MG tablet Take 75 mg by mouth every morning.    Yes Historical Provider, MD  diclofenac sodium (VOLTAREN) 1 % GEL Apply 1 application topically 4 (four) times daily as needed. Applies to both knees as needed for pain   Yes  Historical Provider, MD  furosemide (LASIX) 80 MG tablet Take 1 tablet (80 mg total) by mouth every morning. 06/03/13  Yes Marden Noble, MD  glipiZIDE (GLUCOTROL) 5 MG tablet Take 1 tablet (5 mg total) by mouth 2 (two) times daily before a meal. 06/03/13  Yes Marden Noble, MD  HYDROcodone-acetaminophen (NORCO/VICODIN) 5-325 MG per tablet Take 1 tablet by mouth every 4 (four) hours as needed. 06/03/13  Yes Marden Noble, MD  insulin glargine (LANTUS) 100 UNIT/ML injection Inject 10-40  Units into the skin 2 (two) times daily as needed. Per sliding scale in divided doses. May use up to 140 units/day. Usual dose is 5-10 units   Yes Historical Provider, MD  isosorbide mononitrate (IMDUR) 30 MG 24 hr tablet Take 30 mg by mouth every morning.    Yes Historical Provider, MD  latanoprost (XALATAN) 0.005 % ophthalmic solution Place 1 drop into both eyes at bedtime.     Yes Historical Provider, MD  nitroGLYCERIN (NITROSTAT) 0.4 MG SL tablet Place 0.4 mg under the tongue every 5 (five) minutes as needed. For chest pain   Yes Historical Provider, MD  omeprazole (PRILOSEC) 20 MG capsule Take 20 mg by mouth 2 (two) times daily.    Yes Historical Provider, MD  potassium chloride SA (K-DUR,KLOR-CON) 20 MEQ tablet Take 2 tablets (40 mEq total) by mouth 2 (two) times daily. 06/03/13  Yes Marden Noble, MD  simvastatin (ZOCOR) 20 MG tablet Take 20 mg by mouth every evening.    Yes Historical Provider, MD  tamsulosin (FLOMAX) 0.4 MG CAPS capsule Take 0.8 mg by mouth at bedtime.  05/06/13  Yes Historical Provider, MD  temazepam (RESTORIL) 30 MG capsule Take 30 mg by mouth at bedtime.    Yes Historical Provider, MD    Family History: Family Status  Relation Status Death Age  . Mother Deceased   . Father Deceased   . Sister Deceased   . Brother Deceased   . Brother Deceased   . Sister Alive     Social History:   reports that he quit smoking about 22 years ago. His smoking use included Cigarettes. He has a 20  pack-year smoking history. He quit smokeless tobacco use about 26 years ago. His smokeless tobacco use included Chew. He reports that he does not drink alcohol or use illicit drugs.   History   Social History Narrative  . No narrative on file    Review of Systems: Has been morbidly obese for many years. He has had significant urinary frequency and has to take a urinal to bed with him at night for nocturia. He has significant chronic back pain. His difficulty with anxiety and depression.  Physical Exam: BP 140/45  Pulse 56  Temp(Src) 96 F (35.6 C) (Oral)  Resp 22  Ht 5\' 10"  (1.778 m)  Wt 127 kg (279 lb 15.8 oz)  BMI 40.17 kg/m2  SpO2 95%  General appearance: Pleasant, morbidly obese Native [place] male in no acute distress Head: Normocephalic, without obvious abnormality, atraumatic, Balding male hair pattern Eyes: conjunctivae/corneas clear. PERRL, EOM's intact. Fundi benign. Neck: no adenopathy, no carotid bruit, supple, symmetrical, trachea midline and Difficult to assess for jugular venous distention Lungs: clear to auscultation bilaterally Heart: regular rate and rhythm, S1, S2 normal, no murmur, click, rub or gallop Abdomen: Extremely obese, soft and nontender ,, difficult to tell whether there are any masses or non- Rectal: deferred Extremities: Small excoriation present on her right heel, 3+ edema, legs are edematous. The left heel has a large ulcer and is necrotic and foul-smelling with drainage. Pulses: Cannot feel femoral or distal pulses due to his obesity and the amount of edema Neurologic: Grossly normal  Labs: CBC  Recent Labs  08/25/13 0425  WBC 8.3  RBC 4.91  HGB 12.7*  HCT 41.6  PLT 266  MCV 84.7  MCH 25.9*  MCHC 30.5  RDW 15.9*  LYMPHSABS 2.0  MONOABS 0.6  EOSABS 0.3  BASOSABS 0.0   CMP   Recent  Labs  08/25/13 0425  NA 141  K 4.1  CL 101  CO2 28  GLUCOSE 119*  BUN 11  CREATININE 1.03  CALCIUM 8.9  PROT 6.8  ALBUMIN 2.5*   AST 9  ALT 6  ALKPHOS 88  BILITOT 0.3  GFRNONAA 71*  GFRAA 82*   BNP (last 3 results)  Recent Labs  06/01/13 0440 06/02/13 0534 06/03/13 0430  PROBNP 3738.0* 2280.0* 1275.0*    Radiology: Chest x-ray is not available  EKG: No EKG is available for review at this time  Signed:  W. Ashley RoyaltySpencer Tilley, Jr. MD [facility]ACC   Cardiology Consultant  08/26/2013, 8:54 AM

## 2013-08-26 NOTE — Clinical Documentation Improvement (Signed)
THIS DOCUMENT IS NOT A PERMANENT PART OF THE MEDICAL RECORD  Please update your documentation with the medical record to reflect your response to this query. If you need help knowing how to do this please call [phone]  08/26/13  Dr. 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 Scar tissue present causes skin to skin contact and some moisture. Uses barrier cream at home.  Pressure Ulcer POA: Yes Sacrum fully epithelialized, but moist at times from perspiration while in bed. Home treatment is Desitin cream.    Based on your clinical judgment, please document the STAGE of the chronic sacral ulcer in the progress notes and discharge summary.                        Reviewed:by Dr. Kevan NyGates  Thank You,  Jerral Ralphathy R Stevens  RN BSN CCDS Certified Clinical Documentation Specialist: [phone] Health Information Management [place]  Will do - Dr. Kevan NyGates

## 2013-08-27 ENCOUNTER — Encounter (HOSPITAL_COMMUNITY): Payer: Self-pay | Admitting: Certified Registered Nurse Anesthetist

## 2013-08-27 ENCOUNTER — Encounter (HOSPITAL_COMMUNITY): Payer: Medicare Other | Admitting: Anesthesiology

## 2013-08-27 ENCOUNTER — Encounter (HOSPITAL_COMMUNITY)
Admission: EM | Disposition: A | Discharge status: Rehabilitation Facility | Payer: Medicare Other | Source: Home / Self Care | Attending: Internal Medicine

## 2013-08-27 ENCOUNTER — Inpatient Hospital Stay (HOSPITAL_COMMUNITY): Payer: Medicare Other | Admitting: Anesthesiology

## 2013-08-27 DIAGNOSIS — L89154 Pressure ulcer of sacral region, stage 4: Secondary | ICD-10-CM | POA: Diagnosis present

## 2013-08-27 HISTORY — PX: AMPUTATION: SHX166

## 2013-08-27 LAB — BASIC METABOLIC PANEL
BUN: 11 mg/dL (ref 6–23)
CHLORIDE: 101 meq/L (ref 96–112)
CO2: 28 meq/L (ref 19–32)
Calcium: 8.7 mg/dL (ref 8.4–10.5)
Creatinine, Ser: 1.17 mg/dL (ref 0.50–1.35)
GFR calc Af Amer: 71 mL/min — ABNORMAL LOW (ref >90)
GFR calc non Af Amer: 61 mL/min — ABNORMAL LOW (ref >90)
GLUCOSE: 131 mg/dL — AB (ref 70–99)
Potassium: 4.6 mEq/L (ref 3.7–5.3)
Sodium: 140 mEq/L (ref 137–147)

## 2013-08-27 LAB — GLUCOSE, CAPILLARY
GLUCOSE-CAPILLARY: 143 mg/dL — AB (ref 70–99)
GLUCOSE-CAPILLARY: 185 mg/dL — AB (ref 70–99)
Glucose-Capillary: 98 mg/dL (ref 70–99)
Glucose-Capillary: 87 mg/dL (ref 70–99)
Glucose-Capillary: 132 mg/dL — ABNORMAL HIGH (ref 70–99)
Glucose-Capillary: 175 mg/dL — ABNORMAL HIGH (ref 70–99)

## 2013-08-27 LAB — CBC
HEMATOCRIT: 36.6 % — AB (ref 39.0–52.0)
Hemoglobin: 11.2 g/dL — ABNORMAL LOW (ref 13.0–17.0)
MCH: 25.9 pg — ABNORMAL LOW (ref 26.0–34.0)
MCHC: 30.6 g/dL (ref 30.0–36.0)
MCV: 84.5 fL (ref 78.0–100.0)
Platelets: 301 10*3/uL (ref 150–400)
RBC: 4.33 MIL/uL (ref 4.22–5.81)
RDW: 15.7 % — ABNORMAL HIGH (ref 11.5–15.5)
WBC: 9.2 10*3/uL (ref 4.0–10.5)

## 2013-08-27 LAB — MRSA PCR SCREENING: MRSA by PCR: POSITIVE — AB

## 2013-08-27 SURGERY — AMPUTATION BELOW KNEE
Anesthesia: General | Site: Leg Upper | Laterality: Left

## 2013-08-27 MED ORDER — ONDANSETRON HCL 4 MG/2ML IJ SOLN
INTRAMUSCULAR | Status: AC
  Filled 2013-08-27: qty 2

## 2013-08-27 MED ORDER — POTASSIUM CHLORIDE CRYS ER 20 MEQ PO TBCR: 20.0000 meq | EXTENDED_RELEASE_TABLET | Freq: Every day | ORAL | Status: DC | PRN

## 2013-08-27 MED ORDER — SODIUM CHLORIDE 0.9 % IJ SOLN
INTRAMUSCULAR | Status: AC
  Filled 2013-08-27: qty 10

## 2013-08-27 MED ORDER — METOPROLOL TARTRATE 1 MG/ML IV SOLN: 2.0000 mg | INTRAVENOUS | Status: DC | PRN

## 2013-08-27 MED ORDER — SODIUM CHLORIDE 0.9 % IV SOLN: INTRAVENOUS | Status: DC

## 2013-08-27 MED ORDER — OXYCODONE HCL 5 MG PO TABS
ORAL_TABLET | ORAL | Status: AC
  Filled 2013-08-27: qty 1

## 2013-08-27 MED ORDER — PROPOFOL 10 MG/ML IV BOLUS
INTRAVENOUS | Status: AC
  Filled 2013-08-27: qty 20

## 2013-08-27 MED ORDER — PHENOL 1.4 % MT LIQD: 1.0000 | OROMUCOSAL | Status: DC | PRN

## 2013-08-27 MED ORDER — HYDROMORPHONE HCL PF 1 MG/ML IJ SOLN
INTRAMUSCULAR | Status: AC
  Filled 2013-08-27: qty 1

## 2013-08-27 MED ORDER — MIDAZOLAM HCL 5 MG/5ML IJ SOLN
INTRAMUSCULAR | Status: DC | PRN
  Administered 2013-08-27: 2 mg via INTRAVENOUS

## 2013-08-27 MED ORDER — HYDROMORPHONE HCL PF 1 MG/ML IJ SOLN
0.2500 mg | INTRAMUSCULAR | Status: DC | PRN
Start: 2013-08-27 — End: 2013-08-27
  Administered 2013-08-27 (×2): 0.5 mg via INTRAVENOUS

## 2013-08-27 MED ORDER — LACTATED RINGERS IV SOLN
INTRAVENOUS | Status: DC | PRN
  Administered 2013-08-27: 10:00:00 via INTRAVENOUS

## 2013-08-27 MED ORDER — LIDOCAINE HCL (CARDIAC) 10 MG/ML IV SOLN
INTRAVENOUS | Status: DC | PRN
  Administered 2013-08-27: 100 mg via INTRAVENOUS

## 2013-08-27 MED ORDER — FENTANYL CITRATE 0.05 MG/ML IJ SOLN
INTRAMUSCULAR | Status: DC | PRN
  Administered 2013-08-27 (×3): 50 ug via INTRAVENOUS
  Administered 2013-08-27 (×2): 100 ug via INTRAVENOUS
  Administered 2013-08-27: 50 ug via INTRAVENOUS

## 2013-08-27 MED ORDER — FENTANYL CITRATE 0.05 MG/ML IJ SOLN
INTRAMUSCULAR | Status: AC
  Filled 2013-08-27: qty 5

## 2013-08-27 MED ORDER — ONDANSETRON HCL 4 MG/2ML IJ SOLN
INTRAMUSCULAR | Status: DC | PRN
  Administered 2013-08-27: 4 mg via INTRAVENOUS

## 2013-08-27 MED ORDER — HYDRALAZINE HCL 20 MG/ML IJ SOLN: 10.0000 mg | INTRAMUSCULAR | Status: DC | PRN

## 2013-08-27 MED ORDER — ARTIFICIAL TEARS OP OINT
TOPICAL_OINTMENT | OPHTHALMIC | Status: DC | PRN
  Administered 2013-08-27: 1 via OPHTHALMIC

## 2013-08-27 MED ORDER — PROPOFOL 10 MG/ML IV BOLUS
INTRAVENOUS | Status: DC | PRN
  Administered 2013-08-27: 200 mg via INTRAVENOUS

## 2013-08-27 MED ORDER — DOCUSATE SODIUM 100 MG PO CAPS
100.0000 mg | ORAL_CAPSULE | Freq: Every day | ORAL | Status: DC
Start: 2013-08-28 — End: 2013-08-30
  Administered 2013-08-28 – 2013-08-30 (×3): 100 mg via ORAL
  Filled 2013-08-27 (×3): qty 1

## 2013-08-27 MED ORDER — ALUM & MAG HYDROXIDE-SIMETH 200-200-20 MG/5ML PO SUSP: 15.0000 mL | ORAL | Status: DC | PRN

## 2013-08-27 MED ORDER — ONDANSETRON HCL 4 MG/2ML IJ SOLN: 4.0000 mg | Freq: Once | INTRAMUSCULAR | Status: DC | PRN

## 2013-08-27 MED ORDER — MIDAZOLAM HCL 2 MG/2ML IJ SOLN
INTRAMUSCULAR | Status: AC
  Filled 2013-08-27: qty 2

## 2013-08-27 MED ORDER — PIPERACILLIN-TAZOBACTAM 3.375 G IVPB
3.3750 g | Freq: Once | INTRAVENOUS | Status: AC
  Administered 2013-08-27: 3.375 g via INTRAVENOUS

## 2013-08-27 MED ORDER — ARTIFICIAL TEARS OP OINT
TOPICAL_OINTMENT | OPHTHALMIC | Status: AC
  Filled 2013-08-27: qty 3.5

## 2013-08-27 MED ORDER — MAGNESIUM SULFATE 40 MG/ML IJ SOLN
2.0000 g | Freq: Every day | INTRAMUSCULAR | Status: DC | PRN
  Filled 2013-08-27: qty 50

## 2013-08-27 MED ORDER — ROCURONIUM BROMIDE 50 MG/5ML IV SOLN
INTRAVENOUS | Status: AC
  Filled 2013-08-27: qty 1

## 2013-08-27 MED ORDER — 0.9 % SODIUM CHLORIDE (POUR BTL) OPTIME
TOPICAL | Status: DC | PRN
  Administered 2013-08-27: 1000 mL

## 2013-08-27 MED ORDER — OXYCODONE HCL 5 MG PO TABS
5.0000 mg | ORAL_TABLET | Freq: Once | ORAL | Status: AC | PRN
  Administered 2013-08-27: 5 mg via ORAL

## 2013-08-27 MED ORDER — GUAIFENESIN-DM 100-10 MG/5ML PO SYRP: 15.0000 mL | ORAL_SOLUTION | ORAL | Status: DC | PRN

## 2013-08-27 MED ORDER — OXYCODONE HCL 5 MG/5ML PO SOLN: 5.0000 mg | Freq: Once | ORAL | Status: AC | PRN

## 2013-08-27 MED ORDER — MEPERIDINE HCL 25 MG/ML IJ SOLN: 6.2500 mg | INTRAMUSCULAR | Status: DC | PRN

## 2013-08-27 MED ORDER — LABETALOL HCL 5 MG/ML IV SOLN: 10.0000 mg | INTRAVENOUS | Status: DC | PRN

## 2013-08-27 MED ORDER — EPHEDRINE SULFATE 50 MG/ML IJ SOLN
INTRAMUSCULAR | Status: AC
  Filled 2013-08-27: qty 1

## 2013-08-27 MED ORDER — ENOXAPARIN SODIUM 40 MG/0.4ML ~~LOC~~ SOLN
40.0000 mg | SUBCUTANEOUS | Status: DC
  Administered 2013-08-28 – 2013-08-30 (×3): 40 mg via SUBCUTANEOUS
  Filled 2013-08-27 (×3): qty 0.4

## 2013-08-27 SURGICAL SUPPLY — 55 items
BANDAGE ELASTIC 6 VELCRO ST LF (GAUZE/BANDAGES/DRESSINGS) ×3 IMPLANT
BANDAGE ESMARK 6X9 LF (GAUZE/BANDAGES/DRESSINGS) IMPLANT
BANDAGE GAUZE ELAST BULKY 4 IN (GAUZE/BANDAGES/DRESSINGS) ×6 IMPLANT
BLADE SAW RECIP 87.9 MT (BLADE) ×3 IMPLANT
BNDG CMPR 9X6 STRL LF SNTH (GAUZE/BANDAGES/DRESSINGS)
BNDG COHESIVE 6X5 TAN STRL LF (GAUZE/BANDAGES/DRESSINGS) ×3 IMPLANT
BNDG ESMARK 6X9 LF (GAUZE/BANDAGES/DRESSINGS)
BNDG GAUZE ELAST 4 BULKY (GAUZE/BANDAGES/DRESSINGS) ×2 IMPLANT
CANISTER SUCTION 2500CC (MISCELLANEOUS) ×3 IMPLANT
CLIP TI MEDIUM 6 (CLIP) IMPLANT
COVER SURGICAL LIGHT HANDLE (MISCELLANEOUS) ×3 IMPLANT
CUFF TOURNIQUET SINGLE 24IN (TOURNIQUET CUFF) IMPLANT
CUFF TOURNIQUET SINGLE 34IN LL (TOURNIQUET CUFF) IMPLANT
CUFF TOURNIQUET SINGLE 44IN (TOURNIQUET CUFF) IMPLANT
DRAPE ORTHO SPLIT 77X108 STRL (DRAPES) ×6
DRAPE PROXIMA HALF (DRAPES) IMPLANT
DRAPE SURG ORHT 6 SPLT 77X108 (DRAPES) ×2 IMPLANT
DRSG ADAPTIC 3X8 NADH LF (GAUZE/BANDAGES/DRESSINGS) ×3 IMPLANT
DRSG PAD ABDOMINAL 8X10 ST (GAUZE/BANDAGES/DRESSINGS) ×3 IMPLANT
ELECT REM PT RETURN 9FT ADLT (ELECTROSURGICAL) ×3
ELECTRODE REM PT RTRN 9FT ADLT (ELECTROSURGICAL) ×1 IMPLANT
EVACUATOR 1/8 PVC DRAIN (DRAIN) ×3 IMPLANT
GLOVE BIOGEL PI IND STRL 6.5 (GLOVE) IMPLANT
GLOVE BIOGEL PI IND STRL 7.5 (GLOVE) IMPLANT
GLOVE BIOGEL PI INDICATOR 6.5 (GLOVE) ×6
GLOVE BIOGEL PI INDICATOR 7.5 (GLOVE) ×2
GLOVE ECLIPSE 6.5 STRL STRAW (GLOVE) ×2 IMPLANT
GLOVE SS BIOGEL STRL SZ 7 (GLOVE) ×1 IMPLANT
GLOVE SUPERSENSE BIOGEL SZ 7 (GLOVE) ×4
GOWN STRL REUS W/ TWL LRG LVL3 (GOWN DISPOSABLE) ×3 IMPLANT
GOWN STRL REUS W/TWL LRG LVL3 (GOWN DISPOSABLE) ×9
KIT BASIN OR (CUSTOM PROCEDURE TRAY) ×3 IMPLANT
KIT ROOM TURNOVER OR (KITS) ×3 IMPLANT
NS IRRIG 1000ML POUR BTL (IV SOLUTION) ×3 IMPLANT
PACK GENERAL/GYN (CUSTOM PROCEDURE TRAY) ×3 IMPLANT
PAD ARMBOARD 7.5X6 YLW CONV (MISCELLANEOUS) ×4 IMPLANT
PADDING CAST COTTON 6X4 STRL (CAST SUPPLIES) IMPLANT
SPONGE GAUZE 4X4 12PLY (GAUZE/BANDAGES/DRESSINGS) ×6 IMPLANT
SPONGE GAUZE 4X4 12PLY STER LF (GAUZE/BANDAGES/DRESSINGS) ×2 IMPLANT
SPONGE LAP 18X18 X RAY DECT (DISPOSABLE) ×2 IMPLANT
STAPLER VISISTAT 35W (STAPLE) ×5 IMPLANT
STOCKINETTE IMPERVIOUS LG (DRAPES) ×3 IMPLANT
SUT SILK 2 0 (SUTURE) ×3
SUT SILK 2 0 SH CR/8 (SUTURE) ×6 IMPLANT
SUT SILK 2-0 18XBRD TIE 12 (SUTURE) ×1 IMPLANT
SUT SILK 3 0 (SUTURE) ×3
SUT SILK 3-0 18XBRD TIE 12 (SUTURE) ×1 IMPLANT
SUT VIC AB 0 CTX 18 (SUTURE) ×9 IMPLANT
SUT VIC AB 2-0 CT1 18 (SUTURE) ×3 IMPLANT
SUT VIC AB 2-0 CT1 36 (SUTURE) ×2 IMPLANT
SUT VIC AB 2-0 CTX 36 (SUTURE) ×6 IMPLANT
TOWEL OR 17X24 6PK STRL BLUE (TOWEL DISPOSABLE) ×3 IMPLANT
TOWEL OR 17X26 10 PK STRL BLUE (TOWEL DISPOSABLE) ×3 IMPLANT
UNDERPAD 30X30 INCONTINENT (UNDERPADS AND DIAPERS) ×5 IMPLANT
WATER STERILE IRR 1000ML POUR (IV SOLUTION) ×3 IMPLANT

## 2013-08-27 NOTE — Op Note (Signed)
OPERATIVE REPORT  Date of Surgery: 08/24/2013 - 08/27/2013  Surgeon: Josephina GipJames Lawson, MD  Assistant: Lianne CureMaureen Collins PA   Pre-op Diagnosis: Peripheral Vascular Disease with necrotic ulcer left heel-nonhealing and cellulitis  Post-op Diagnosis: Same  Procedure: Procedure(s): LEFT ABOVE THE KNEE AMPUTATION  Drains-one Hemovac  Anesthesia: General  EBL: 200 cc  Complications: None  Patient taken to the operating room placed in supine position at which time satisfactory general endotracheal anesthesia was minister. There was necrotic ulcer in the left heel with cellulitis distally in the left leg. After prepping and draping in routine sterile manner the lower leg was isolated with an impervious stockinette. Skin flaps were marked for a left above-knee amputation as long as possible in the thigh with equal anterior and posterior flaps. This was based about 4-5 inches proximal to the knee joint. Incision was carried down through skin and subcutaneous tissue with the scalpel. The muscle was divided with the Bovie. Femur was cleaned proximally with periosteal elevator and divided with a Stryker saw smoothed with a rasp. The superficial femoral artery vein and nerve were individually ligated with 2-0 silk ties and ligatures. Posterior muscles were divided and the specimen removed from the table. Adequate hemostasis was achieved and the stump was thoroughly irrigated with warm saline. A Hemovac drain was brought out medially and laterally and secured with a silk suture. Fascia was then approximated with interrupted 0 Vicryl subcutaneous tissue 2-0 Vicryl and skin with staples sterile dressing applied patient taken to the recovery room in stable condition  Procedure Details:   Josephina GipJames Lawson, MD 08/27/2013 11:52 AM

## 2013-08-27 NOTE — Progress Notes (Signed)
Paged on call Dr. From vascular surgery regarding patient's uncontrolled pain.  Awaiting orders.  Lance BoschAnna Antionetta Ator, RN

## 2013-08-27 NOTE — Preoperative (Signed)
Beta Blockers   Reason not to administer Beta Blockers:Not Applicable 

## 2013-08-27 NOTE — Transfer of Care (Signed)
Immediate Anesthesia Transfer of Care Note  Patient: Nicholas [name]  Procedure(s) Performed: Procedure(s): LEFT ABOVE THE KNEE AMPUTATION  (Left)  Patient Location: PACU  Anesthesia Type:General  Level of Consciousness: awake, alert , oriented and patient cooperative  Airway & Oxygen Therapy: Patient Spontanous Breathing and Patient connected to nasal cannula oxygen  Post-op Assessment: Report given to PACU RN, Post -op Vital signs reviewed and stable and Patient moving all extremities X 4  Post vital signs: Reviewed and stable  Complications: No apparent anesthesia complications

## 2013-08-27 NOTE — Progress Notes (Signed)
ANTIBIOTIC CONSULT NOTE - FOLLOW UP  Pharmacy Consult for Vancomycin, Zosyn Indication: cellulitis  Allergies  Allergen Reactions  . Contrast Media [Iodinated Diagnostic Agents] Other (See Comments)    Shaking and feeling terrible    Patient Measurements: Height: 5\' 10"  (177.8 cm) Weight: 279 lb 15.8 oz (127 kg) IBW/kg (Calculated) : 73  Vital Signs: Temp: 98.3 F (36.8 C) (01/23 1245) Temp src: Oral (01/23 0900) BP: 151/60 mmHg (01/23 1207) Pulse Rate: 56 (01/23 1300) Intake/Output from previous day: 01/22 0701 - 01/23 0700 In: 120 [P.O.:120] Out: 2575 [Urine:2575] Intake/Output from this shift: Total I/O In: 400 [I.V.:400] Out: 503 [Urine:300; Drains:3; Blood:200]  Labs:  Recent Labs  08/25/13 0425 08/27/13 0440  WBC 8.3 9.2  HGB 12.7* 11.2*  PLT 266 301  CREATININE 1.03 1.17   Estimated Creatinine Clearance: 77.5 ml/min (by C-G formula based on Cr of 1.17). No results found for this basename: VANCOTROUGH, Corlis Leak, VANCORANDOM, [place], [place], [place], [place], TOBRAPEAK, TOBRARND, AMIKACINPEAK, AMIKACINTROU, AMIKACIN,  in the last 72 hours   Microbiology: Recent Results (from the past 720 hour(s))  MRSA PCR SCREENING     Status: Abnormal   Collection Time    08/24/13 11:50 PM      Result Value Range Status   MRSA by PCR POSITIVE (*) NEGATIVE Final   Comment:            The GeneXpert MRSA Assay (FDA     approved for NASAL specimens     only), is one component of a     comprehensive MRSA colonization     surveillance program. It is not     intended to diagnose MRSA     infection nor to guide or     monitor treatment for     MRSA infections.     RESULT CALLED TO, READ BACK BY AND VERIFIED WITH:     Nicholas Lose RN [id number] AT 0241 SKEEN,P  MRSA PCR SCREENING     Status: Abnormal   Collection Time    08/27/13  8:16 AM      Result Value Range Status   MRSA by PCR POSITIVE (*) NEGATIVE Final   Comment:            The GeneXpert MRSA Assay  (FDA     approved for NASAL specimens     only), is one component of a     comprehensive MRSA colonization     surveillance program. It is not     intended to diagnose MRSA     infection nor to guide or     monitor treatment for     MRSA infections.    Anti-infectives   Start     Dose/Rate Route Frequency Ordered Stop   08/27/13 1130  piperacillin-tazobactam (ZOSYN) IVPB 3.375 g     3.375 g 12.5 mL/hr over 240 Minutes Intravenous  Once 08/27/13 1122 08/27/13 1122   08/25/13 1200  vancomycin (VANCOCIN) IVPB 1000 mg/200 mL premix     1,000 mg 200 mL/hr over 60 Minutes Intravenous Every 12 hours 08/24/13 2316     08/25/13 0030  piperacillin-tazobactam (ZOSYN) IVPB 3.375 g     3.375 g 12.5 mL/hr over 240 Minutes Intravenous 3 times per day 08/24/13 2316     08/24/13 2315  vancomycin (VANCOCIN) IVPB 1000 mg/200 mL premix     1,000 mg 200 mL/hr over 60 Minutes Intravenous  Once 08/24/13 2316 08/25/13 0051   08/24/13 1800  vancomycin (VANCOCIN) IVPB 1000 mg/200 mL premix  1,000 mg 200 mL/hr over 60 Minutes Intravenous  Once 08/24/13 1746 08/24/13 2030   08/24/13 1800  piperacillin-tazobactam (ZOSYN) IVPB 3.375 g     3.375 g 100 mL/hr over 30 Minutes Intravenous  Once 08/24/13 1746 08/24/13 1911     Assessment: 73 y/o M with left foot cellulitis (no osteo per imaging).  Pharmacy consulted to dose vancomycin and zosyn.  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 Stage 3 CKD. crcl ~70-80l; Flomax  Plan:  -Continue vancomycin 1g IV q12h, if to continue past 72h will check trough -Zosyn 3.375G IV q8h to be infused over 4 hours - Follow up SCr, UOP, cultures, clinical course and adjust as clinically indicated.   Thank you for allowing pharmacy to be a part of this patients care team.  Rowe Robert  Pharm.D., BCPS Clinical Pharmacist 08/27/2013 1:52 PM Pager: [phone] Phone: [phone]

## 2013-08-27 NOTE — H&P (View-Only) (Signed)
Patient ID: Nicholas [name], male   DOB: 08/28/1940, 73 y.o.   MRN: [id number] Vascular Surgery Progress Note  Subjective: Gangrene left foot-non-reconstructable vascular disease  Objective:  Filed Vitals:   08/26/13 0831  BP: 140/45  Pulse: 56  Temp: 96 F (35.6 C)  Resp: 22    General alert and oriented x3 Left [place] unchanged from exam yesterday with gangrenous ulcer left heel   Labs:  Recent Labs Lab 08/24/13 1304 08/25/13 0425  CREATININE 1.26 1.03    Recent Labs Lab 08/24/13 1304 08/25/13 0425  NA 143 141  K 4.4 4.1  CL 100 101  CO2 29 28  BUN 14 11  CREATININE 1.26 1.03  GLUCOSE 130* 119*  CALCIUM 9.1 8.9    Recent Labs Lab 08/24/13 1304 08/25/13 0425  WBC 9.4 8.3  HGB 12.8* 12.7*  HCT 41.6 41.6  PLT 290 266   No results found for this basename: INR,  in the last 168 hours  I/O last 3 completed shifts: In: 720 [P.O.:720] Out: 1600 [Urine:1600]  Imaging: Dg Foot Complete Left  08/24/2013   CLINICAL DATA:  Open wound, infection  EXAM: LEFT FOOT - COMPLETE 3+ VIEW  COMPARISON:  08/28/2011  FINDINGS: Marked soft tissue swelling. Bones are osteopenic. Degenerative changes noted most pronounced of the left first MTP joint. No gross malalignment. Soft tissue ulceration noted of the calcaneal region. No definite displaced fracture or osseous destruction by plain radiography. Soft tissue calcifications present.  IMPRESSION: Soft tissue swelling.  Soft tissue ulceration of the heel region  Diffuse osteopenia and degenerative changes  No definite plain radiographic evidence of bone loss or osteomyelitis. No fracture.   Electronically Signed   By: Trevor  Shick M.D.   On: 08/24/2013 18:46    Assessment/Plan:   LOS: 2 days  s/p Procedure(s): AMPUTATION BELOW KNEE-LEFT BELOW KNEE AMP VS ABOVE KNEE AMPUTATION   Plan left leg amputation tomorrow either AKA or BKA-discussed with patient and he understands Appreciate Dr.Tilley's input   Danie Lawson,  MD 08/26/2013 11:29 AM            

## 2013-08-27 NOTE — Progress Notes (Signed)
Patient arrived via bed from PACU.  Patient is arousable and says his pain is tolerable.  Report gotten from PACU RN and patient made comfortable in room.  Will monitor patient. Lance BoschAnna Derrel Moore, RN

## 2013-08-27 NOTE — Progress Notes (Signed)
Subjective: Pt is ready for surgery  Objective: Weight change:   Intake/Output Summary (Last 24 hours) at 08/27/13 0812 Last data filed at 08/26/13 1850  Gross per 24 hour  Intake    120 ml  Output   2575 ml  Net  -2455 ml   Filed Vitals:   08/26/13 1500 08/26/13 1842 08/26/13 2200 08/27/13 0600  BP: 151/69 175/66 163/70 146/78  Pulse: 54 110 62 59  Temp: 98.1 F (36.7 C) 98.1 F (36.7 C) 98 F (36.7 C) 97.7 F (36.5 C)  TempSrc: Oral Oral Oral Oral  Resp: $Remo'18 20 20 20  'EYXIH$ Height:      Weight:      SpO2: 97% 94% 96% 96%    General: Well-developed and nourished, obese.  Cardiovascular: S1-S2 heard.  Respiratory: No rhonchi or crepitations.  Abdomen: Soft nontender bowel sounds present.  Skin: Patient has a large left heel ulcer measuring around 16 x 8 cm diameter with black dark base with some discharge. There is           also erythema of the left leg with warmth.  Musculoskeletal: Has chronic left lower extremity edema with marked deformity of the right knee and feet bilaterally  Psychiatric: Appears normal.  Neurologic: Alert awake oriented to time place and person. Moves all extremities. 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   Lab Results: Results for orders placed during the hospital encounter of 08/24/13 (from the past 48 hour(s))  GLUCOSE, CAPILLARY     Status: Abnormal   Collection Time    08/25/13 11:14 AM      Result Value Range   Glucose-Capillary 150 (*) 70 - 99 mg/dL  GLUCOSE, CAPILLARY     Status: Abnormal   Collection Time    08/25/13  4:29 PM      Result Value Range   Glucose-Capillary 162 (*) 70 - 99 mg/dL  GLUCOSE, CAPILLARY     Status: Abnormal   Collection Time    08/26/13 10:30 AM      Result Value Range   Glucose-Capillary 101 (*) 70 - 99 mg/dL  PRO B NATRIURETIC PEPTIDE     Status: Abnormal   Collection Time    08/26/13 11:30 AM      Result Value Range   Pro B  Natriuretic peptide (BNP) 918.5 (*) 0 - 125 pg/mL  GLUCOSE, CAPILLARY     Status: None   Collection Time    08/26/13 11:37 AM      Result Value Range   Glucose-Capillary 88  70 - 99 mg/dL  GLUCOSE, CAPILLARY     Status: Abnormal   Collection Time    08/26/13  5:08 PM      Result Value Range   Glucose-Capillary 152 (*) 70 - 99 mg/dL  GLUCOSE, CAPILLARY     Status: Abnormal   Collection Time    08/26/13  9:16 PM      Result Value Range   Glucose-Capillary 112 (*) 70 - 99 mg/dL  BASIC METABOLIC PANEL     Status: Abnormal   Collection Time    08/27/13  4:40 AM      Result Value Range   Sodium 140  137 - 147 mEq/L   Potassium 4.6  3.7 - 5.3 mEq/L   Chloride 101  96 - 112 mEq/L   CO2 28  19 - 32 mEq/L   Glucose, Bld 131 (*) 70 - 99 mg/dL  BUN 11  6 - 23 mg/dL   Creatinine, Ser 1.17  0.50 - 1.35 mg/dL   Calcium 8.7  8.4 - 10.5 mg/dL   GFR calc non Af Amer 61 (*) >90 mL/min   GFR calc Af Amer 71 (*) >90 mL/min   Comment: (NOTE)     The eGFR has been calculated using the CKD EPI equation.     This calculation has not been validated in all clinical situations.     eGFR's persistently <90 mL/min signify possible Chronic Kidney     Disease.  CBC     Status: Abnormal   Collection Time    08/27/13  4:40 AM      Result Value Range   WBC 9.2  4.0 - 10.5 K/uL   RBC 4.33  4.22 - 5.81 MIL/uL   Hemoglobin 11.2 (*) 13.0 - 17.0 g/dL   HCT 36.6 (*) 39.0 - 52.0 %   MCV 84.5  78.0 - 100.0 fL   MCH 25.9 (*) 26.0 - 34.0 pg   MCHC 30.6  30.0 - 36.0 g/dL   RDW 15.7 (*) 11.5 - 15.5 %   Platelets 301  150 - 400 K/uL  GLUCOSE, CAPILLARY     Status: None   Collection Time    08/27/13  6:38 AM      Result Value Range   Glucose-Capillary 98  70 - 99 mg/dL    Studies/Results: Dg Chest 2 View  08/26/2013   CLINICAL DATA:  Wound infection of the left foot. Pre operative respiratory exam.  EXAM: CHEST  2 VIEW  COMPARISON:  05/31/2013 and 12/11/2011  FINDINGS: The heart is at the upper limits of  normal in size. There are no acute infiltrates or effusions. No acute osseous abnormality. Prior CABG. No significant change since the prior chest x-ray of 12/11/2011  IMPRESSION: No active cardiopulmonary disease.   Electronically Signed   By: Rozetta Nunnery M.D.   On: 08/26/2013 15:39   Medications: Scheduled Meds: . allopurinol  300 mg Oral q morning - 10a  . ALPRAZolam  0.5 mg Oral Custom  . ALPRAZolam  2 mg Oral QHS  . aspirin EC  81 mg Oral Daily  . atenolol  50 mg Oral BID  . brimonidine  1 drop Both Eyes QHS   And  . timolol  1 drop Both Eyes QHS  . Chlorhexidine Gluconate Cloth  6 each Topical Q0600  . clopidogrel  75 mg Oral Q breakfast  . furosemide  80 mg Oral q morning - 10a  . glipiZIDE  5 mg Oral BID AC  . insulin aspart  0-9 Units Subcutaneous TID WC  . insulin glargine  5 Units Subcutaneous Daily  . isosorbide mononitrate  30 mg Oral q morning - 10a  . latanoprost  1 drop Both Eyes QHS  . mupirocin ointment  1 application Nasal BID  . pantoprazole  40 mg Oral Daily  . piperacillin-tazobactam (ZOSYN)  IV  3.375 g Intravenous Q8H  . potassium chloride SA  40 mEq Oral BID  . simvastatin  20 mg Oral QPM  . sodium chloride  3 mL Intravenous Q12H  . tamsulosin  0.8 mg Oral QHS  . vancomycin  1,000 mg Intravenous Q12H  . zolpidem  5 mg Oral QHS   Continuous Infusions:  PRN Meds:.acetaminophen, acetaminophen, HYDROcodone-acetaminophen, morphine injection, nitroGLYCERIN, ondansetron (ZOFRAN) IV, ondansetron  Assessment/Plan: Principal Problem:   Cellulitis of foot Active Problems:   Type 2 diabetes mellitus with vascular disease  Hypertensive heart disease without CHF   PAD (peripheral artery disease)   CAD (coronary artery disease), native coronary artery   Atherosclerosis of native arteries of the extremities with ulceration(440.23)   MRSA (methicillin resistant Staphylococcus aureus) infection   Sacral decubitus ulcer, stage IV  Infected left foot ulcer with  cellulitis - patient's left foot ulcer looks gangrenous. Continue vancomycin and Zosyn. For amputation today BKA vs AKA Diabetes mellitus type 2 - continue Lantus and glipizide closely follow CBGs with sliding-scale coverage.  Peripheral arterial disease status post stenting - continue antiplatelet agents. .  History of CAD status post CABG - denies any chest pain.  History of ischemic cardiomyopathy last EF was 30% - continue Lasix and closely follow intake output. History of OSA noncompliant with CPAP.  Sacral decubitus ulcer - wound team has been consulted and orders written - Stage IV - epitheliaized - stable  Psychiatric history - stable on current medications  MRSA positive nasal swab  No CODE BLUE  Disposition - Rehab facility following surgery    LOS: 3 days   Henrine Screws, MD 08/27/2013, 8:12 AM

## 2013-08-27 NOTE — Progress Notes (Addendum)
Subjective:  NO c/o SOB or chest pain, c/o left leg pain  Objective:  Vital Signs in the last 24 hours: BP 146/78  Pulse 59  Temp(Src) 97.7 F (36.5 C) (Oral)  Resp 20  Ht 5\' 10"  (1.778 m)  Wt 127 kg (279 lb 15.8 oz)  BMI 40.17 kg/m2  SpO2 96%  Physical Exam: Markedly obese American [place] male in NAD Lungs:  Clear  Cardiac:  Regular rhythm, normal S1 and S2, no S3 Extremities:  Feet deformed, full thickness foul smelling ulcer on left heel  Intake/Output from previous day: 01/22 0701 - 01/23 0700 In: 120 [P.O.:120] Out: 2575 [Urine:2575] Weight Filed Weights   08/24/13 1302 08/24/13 2300 08/25/13 0500  Weight: 122.471 kg (270 lb) 127.3 kg (280 lb 10.3 oz) 127 kg (279 lb 15.8 oz)    Lab Results: Basic Metabolic Panel:  Recent Labs  09/81/1901/21/15 0425 08/27/13 0440  NA 141 140  K 4.1 4.6  CL 101 101  CO2 28 28  GLUCOSE 119* 131*  BUN 11 11  CREATININE 1.03 1.17    CBC:  Recent Labs  08/24/13 1304 08/25/13 0425 08/27/13 0440  WBC 9.4 8.3 9.2  NEUTROABS 5.6 5.5  --   HGB 12.8* 12.7* 11.2*  HCT 41.6 41.6 36.6*  MCV 85.4 84.7 84.5  PLT 290 266 301    BNP    Component Value Date/Time   PROBNP 918.5* 08/26/2013 1130    Assessment/Plan:  1. Stable for surgery 2. Diastolic CHF compensated EF 10/14 was 55% 3. Gangrenous foot   Will need to continue meds after surgery as preop.  He expresses to be DNR after the operation.  EKG reviewed and shows IRBBB and LAD     W. Ashley RoyaltySpencer Ferris Fielden, Jr.  MD [facility]iology  08/27/2013, 8:32 AM

## 2013-08-27 NOTE — Anesthesia Postprocedure Evaluation (Signed)
Anesthesia Post Note  Patient: Nicholas [name]  Procedure(s) Performed: Procedure(s) (LRB): LEFT ABOVE THE KNEE AMPUTATION  (Left)  Anesthesia type: general  Patient location: PACU  Post pain: Pain level controlled  Post assessment: Patient's Cardiovascular Status Stable  Last Vitals:  Filed Vitals:   08/27/13 1430  BP: 167/74  Pulse: 56  Temp: 36.6 C  Resp: 15    Post vital signs: Reviewed and stable  Level of consciousness: sedated  Complications: No apparent anesthesia complications

## 2013-08-27 NOTE — Interval H&P Note (Signed)
History and Physical Interval Note:  08/27/2013 9:57 AM  Nicholas [name]  has presented today for surgery, with the diagnosis of Peripheral Vascular Disease with nonhealing ulcer left heel  The various methods of treatment have been discussed with the patient and family. After consideration of risks, benefits and other options for treatment, the patient has consented to  Procedure(s): AMPUTATION BELOW KNEE-LEFT BELOW KNEE AMP VS ABOVE KNEE AMPUTATION  (Left) as a surgical intervention .  The patient's history has been reviewed, patient examined, no change in status, stable for surgery.  I have reviewed the patient's chart and labs.  Questions were answered to the patient's satisfaction.     Josephina GipLAWSON, [place]

## 2013-08-27 NOTE — Anesthesia Preprocedure Evaluation (Signed)
Anesthesia Evaluation  Patient identified by MRN, date of birth, ID band Patient awake    Reviewed: Allergy & Precautions, H&P , NPO status , Patient's Chart, lab work & pertinent test results  Airway Mallampati: I TM Distance: >3 FB Neck ROM: Full    Dental   Pulmonary former smoker,          Cardiovascular hypertension, Pt. on medications + CAD and + CABG     Neuro/Psych    GI/Hepatic GERD-  Medicated and Controlled,  Endo/Other  diabetes, Type 2, Insulin Dependent and Oral Hypoglycemic Agents  Renal/GU      Musculoskeletal   Abdominal   Peds  Hematology   Anesthesia Other Findings   Reproductive/Obstetrics                           Anesthesia Physical Anesthesia Plan  ASA: III  Anesthesia Plan: General   Post-op Pain Management:    Induction: Intravenous  Airway Management Planned: LMA  Additional Equipment:   Intra-op Plan:   Post-operative Plan: Extubation in OR  Informed Consent: I have reviewed the patients History and Physical, chart, labs and discussed the procedure including the risks, benefits and alternatives for the proposed anesthesia with the patient or authorized representative who has indicated his/her understanding and acceptance.     Plan Discussed with: CRNA and Surgeon  Anesthesia Plan Comments:         Anesthesia Quick Evaluation

## 2013-08-28 LAB — BASIC METABOLIC PANEL
BUN: 11 mg/dL (ref 6–23)
CO2: 28 mEq/L (ref 19–32)
CREATININE: 1.03 mg/dL (ref 0.50–1.35)
Calcium: 8.4 mg/dL (ref 8.4–10.5)
Chloride: 99 mEq/L (ref 96–112)
GFR, EST AFRICAN AMERICAN: 82 mL/min — AB (ref >90)
GFR, EST NON AFRICAN AMERICAN: 71 mL/min — AB (ref >90)
Glucose, Bld: 131 mg/dL — ABNORMAL HIGH (ref 70–99)
Potassium: 4.6 mEq/L (ref 3.7–5.3)
Sodium: 139 mEq/L (ref 137–147)

## 2013-08-28 LAB — CBC
HEMATOCRIT: 32.8 % — AB (ref 39.0–52.0)
Hemoglobin: 10 g/dL — ABNORMAL LOW (ref 13.0–17.0)
MCH: 25.4 pg — ABNORMAL LOW (ref 26.0–34.0)
MCHC: 30.5 g/dL (ref 30.0–36.0)
MCV: 83.2 fL (ref 78.0–100.0)
PLATELETS: 314 10*3/uL (ref 150–400)
RBC: 3.94 MIL/uL — ABNORMAL LOW (ref 4.22–5.81)
RDW: 15.3 % (ref 11.5–15.5)
WBC: 9.6 10*3/uL (ref 4.0–10.5)

## 2013-08-28 LAB — GLUCOSE, CAPILLARY
GLUCOSE-CAPILLARY: 132 mg/dL — AB (ref 70–99)
Glucose-Capillary: 129 mg/dL — ABNORMAL HIGH (ref 70–99)
Glucose-Capillary: 133 mg/dL — ABNORMAL HIGH (ref 70–99)
Glucose-Capillary: 175 mg/dL — ABNORMAL HIGH (ref 70–99)

## 2013-08-28 NOTE — Evaluation (Signed)
Physical Therapy Evaluation Patient Details Name: Nicholas [name] MRN: [id number] DOB: October 06, 1940 Today's Date: 08/28/2013 Time: [id number] PT Time Calculation (min): 22 min  PT Assessment / Plan / Recommendation History of Present Illness  Nicholas [name] is a 73 y.o. male with history of diabetes mellitus2, CAD status post CABG, CHF/ischemic cardiomyopathy last EF was 30%, peripheral vascular disease, history of seizures and chronic venous insufficiency, OSA noncompliant with CPAP was referred to the ER by patient's primary care physician after patient was noticed to have worsening left foot ulcer with gangrene.  Patient now s/p Lt AKA.  Clinical Impression  Patient presents with problems listed below.  Will benefit from acute PT to address mobility issues. Recommend Inpatient Rehab consult to maximize functional independence - most likely at w/c level.    PT Assessment  Patient needs continued PT services    Follow Up Recommendations  CIR    Does the patient have the potential to tolerate intense rehabilitation      Barriers to Discharge Inaccessible home environment;Decreased caregiver support Patient with 2 steps to get into house.  Do not feel wife can provide needed assist at this time.    Equipment Recommendations  Wheelchair cushion (measurements PT)    Recommendations for Other Services Rehab consult   Frequency Min 3X/week    Precautions / Restrictions Precautions Precautions: Fall Precaution Comments: Minimally ambulatory pta. Restrictions Weight Bearing Restrictions: No   Pertinent Vitals/Pain Pain impacting mobility      Mobility  Bed Mobility Overal bed mobility: Needs Assistance;+2 for physical assistance Bed Mobility: Rolling;Sidelying to Sit;Sit to Supine Rolling: Mod assist Sidelying to sit: +2 for physical assistance;Max assist Sit to supine: +2 for physical assistance;Total assist General bed mobility comments: Verbal cues for technique.  Assist to  complete roll to left side, using rail with RUE.  +2 assist to raise trunk to sitting position.  Sitting balance fair once upright.  Attempted scooting backward and to side in sitting - patient unable.  Sat EOB x 6 minutes.  Declined further therapy due to increasing pain.  Returned to supine with +2 total assist.    Exercises     PT Diagnosis: Generalized weakness;Acute pain  PT Problem List: Decreased strength;Decreased activity tolerance;Decreased balance;Decreased mobility;Obesity;Pain PT Treatment Interventions: DME instruction;Functional mobility training;Therapeutic exercise;Balance training;Patient/family education;Wheelchair mobility training     PT Goals(Current goals can be found in the care plan section) Acute Rehab PT Goals Patient Stated Goal: To get stronger PT Goal Formulation: With patient Time For Goal Achievement: 09/11/13 Potential to Achieve Goals: Good  Visit Information  Last PT Received On: 08/28/13 Assistance Needed: +2 History of Present Illness: Nicholas [name] is a 73 y.o. male with history of diabetes mellitus2, CAD status post CABG, CHF/ischemic cardiomyopathy last EF was 30%, peripheral vascular disease, history of seizures and chronic venous insufficiency, OSA noncompliant with CPAP was referred to the ER by patient's primary care physician after patient was noticed to have worsening left foot ulcer with gangrene.  Patient now s/p Lt AKA.       Prior Functioning  Home Living Family/patient expects to be discharged to:: Inpatient rehab Living Arrangements: Spouse/significant other (Wife uses RW) Available Help at Discharge: Family;Available 24 hours/day (Wife uses RW - limited ability to assist) Type of Home: House Home Access: Stairs to enter Entergy CorporationEntrance Stairs-Number of Steps: 2 Entrance Stairs-Rails: None Home Layout: One level Home Equipment: Environmental consultantWalker - 2 wheels;Wheelchair - Horticulturist, commercialmanual;Shower seat (Lift chair) Prior Function Level of Independence:  Independent with  assistive device(s);Needs assistance Gait / Transfers Assistance Needed: Ambulates short distances with RW, depending mostly on LLE.  Uses w/c ADL's / Homemaking Assistance Needed: Wife assists patient with bathing and dressing, and meal prep Communication Communication: No difficulties    Cognition  Cognition Arousal/Alertness: Awake/alert Behavior During Therapy: WFL for tasks assessed/performed Overall Cognitive Status: Within Functional Limits for tasks assessed    Extremity/Trunk Assessment Upper Extremity Assessment Upper Extremity Assessment: Generalized weakness Lower Extremity Assessment Lower Extremity Assessment: RLE deficits/detail;LLE deficits/detail RLE Deficits / Details: Decreased strength - grossly 3/5.  Edema noted lower leg and foot.  Knee injury/surgery in past. RLE Coordination: decreased gross motor LLE Deficits / Details: New AKA.  Hip strength 3-/5 LLE: Unable to fully assess due to pain   Balance Balance Overall balance assessment: Needs assistance Sitting-balance support: Single extremity supported;Feet unsupported Sitting balance-Leahy Scale: Fair Sitting balance - Comments: Patient sat EOB x 6 minutes.  Pain increasing in RLE.  Attempted to scoot in bed using UE's - unable.  End of Session PT - End of Session Equipment Utilized During Treatment: Oxygen Activity Tolerance: Patient limited by pain;Patient limited by fatigue Patient left: in bed;with call bell/phone within reach;with bed alarm set;with family/visitor present Nurse Communication: Mobility status;Need for lift equipment  GP     Vena Austria 08/28/2013, 3:52 PM Durenda Hurt. Renaldo Fiddler, [facility] Acute Rehab Services Pager [phone]

## 2013-08-28 NOTE — Progress Notes (Signed)
Patient ID: Nicholas [name], male   DOB: [birth year], 73 y.o.   MRN: [id number] Sleeping comfortable. Did not awaken. AKA incision dressing with a cyst intact with no drainage. Will remove dressing and probably DC drains tomorrow.

## 2013-08-28 NOTE — Progress Notes (Signed)
Assessment/Plan: Principal Problem:   Cellulitis of foot - s/p AKA. So far so good. Will advance diet. Continue meds, but wonder how long to continue abx since infection has been removed. Watch sugars.  Active Problems:   Type 2 diabetes mellitus with vascular disease   Hypertensive heart disease without CHF   PAD (peripheral artery disease)   CAD (coronary artery disease), native coronary artery   Atherosclerosis of native arteries of the extremities with ulceration(440.23)   MRSA (methicillin resistant Staphylococcus aureus) infection   Sacral decubitus ulcer, stage IV   Subjective: More awake and alert now compared to earlier this morning. Would like to advance diet. Not much pain right now. No new issues.   Objective:  Vital Signs: Filed Vitals:   08/27/13 1430 08/27/13 2200 08/28/13 0521 08/28/13 1002  BP: 167/74 165/82 159/73 169/80  Pulse: 56 63 60 65  Temp: 97.9 F (36.6 C) 97.8 F (36.6 C) 98.8 F (37.1 C) 99.2 F (37.3 C)  TempSrc: Oral Oral Oral   Resp: 15 16 18 18   Height:      Weight:      SpO2: 99% 93% 98% 97%     EXAM: alert. Oriented.    Intake/Output Summary (Last 24 hours) at 08/28/13 1056 Last data filed at 08/28/13 0500  Gross per 24 hour  Intake    400 ml  Output   1203 ml  Net   -803 ml    Lab Results:  Recent Labs  08/27/13 0440 08/28/13 0439  NA 140 139  K 4.6 4.6  CL 101 99  CO2 28 28  GLUCOSE 131* 131*  BUN 11 11  CREATININE 1.17 1.03  CALCIUM 8.7 8.4   No results found for this basename: AST, ALT, ALKPHOS, BILITOT, PROT, ALBUMIN,  in the last 72 hours No results found for this basename: LIPASE, AMYLASE,  in the last 72 hours  Recent Labs  08/27/13 0440 08/28/13 0439  WBC 9.2 9.6  HGB 11.2* 10.0*  HCT 36.6* 32.8*  MCV 84.5 83.2  PLT 301 314   No results found for this basename: CKTOTAL, CKMB, CKMBINDEX, TROPONINI,  in the last 72 hours BNP    Component Value Date/Time   PROBNP 918.5* 08/26/2013 1130   No results  found for this basename: DDIMER,  in the last 72 hours No results found for this basename: HGBA1C,  in the last 72 hours No results found for this basename: CHOL, HDL, LDLCALC, TRIG, CHOLHDL, LDLDIRECT,  in the last 72 hours No results found for this basename: TSH, T4TOTAL, FREET3, T3FREE, THYROIDAB,  in the last 72 hours No results found for this basename: VITAMINB12, FOLATE, FERRITIN, TIBC, IRON, RETICCTPCT,  in the last 72 hours  Studies/Results: Dg Chest 2 View  08/26/2013   CLINICAL DATA:  Wound infection of the left foot. Pre operative respiratory exam.  EXAM: CHEST  2 VIEW  COMPARISON:  05/31/2013 and 12/11/2011  FINDINGS: The heart is at the upper limits of normal in size. There are no acute infiltrates or effusions. No acute osseous abnormality. Prior CABG. No significant change since the prior chest x-ray of 12/11/2011  IMPRESSION: No active cardiopulmonary disease.   Electronically Signed   By: Geanie CooleyJim  Maxwell M.D.   On: 08/26/2013 15:39   Medications: Medications administered in the last 24 hours reviewed.  Current Medication List reviewed.    LOS: 4 days   [facility] HospitalBORNE,Perfecto CHARLES [facility] [phone]) 08/28/2013, 10:56 AM

## 2013-08-29 LAB — CBC
HEMATOCRIT: 30.6 % — AB (ref 39.0–52.0)
HEMOGLOBIN: 9.3 g/dL — AB (ref 13.0–17.0)
MCH: 25.6 pg — ABNORMAL LOW (ref 26.0–34.0)
MCHC: 30.4 g/dL (ref 30.0–36.0)
MCV: 84.3 fL (ref 78.0–100.0)
Platelets: 286 10*3/uL (ref 150–400)
RBC: 3.63 MIL/uL — ABNORMAL LOW (ref 4.22–5.81)
RDW: 15.4 % (ref 11.5–15.5)
WBC: 8.7 10*3/uL (ref 4.0–10.5)

## 2013-08-29 LAB — BASIC METABOLIC PANEL
BUN: 10 mg/dL (ref 6–23)
CHLORIDE: 99 meq/L (ref 96–112)
CO2: 29 mEq/L (ref 19–32)
Calcium: 8.6 mg/dL (ref 8.4–10.5)
Creatinine, Ser: 1.18 mg/dL (ref 0.50–1.35)
GFR calc non Af Amer: 60 mL/min — ABNORMAL LOW (ref >90)
GFR, EST AFRICAN AMERICAN: 70 mL/min — AB (ref >90)
Glucose, Bld: 76 mg/dL (ref 70–99)
POTASSIUM: 4.4 meq/L (ref 3.7–5.3)
Sodium: 140 mEq/L (ref 137–147)

## 2013-08-29 LAB — GLUCOSE, CAPILLARY
Glucose-Capillary: 64 mg/dL — ABNORMAL LOW (ref 70–99)
Glucose-Capillary: 87 mg/dL (ref 70–99)
Glucose-Capillary: 108 mg/dL — ABNORMAL HIGH (ref 70–99)
Glucose-Capillary: 131 mg/dL — ABNORMAL HIGH (ref 70–99)
Glucose-Capillary: 107 mg/dL — ABNORMAL HIGH (ref 70–99)

## 2013-08-29 NOTE — Progress Notes (Signed)
Hypoglycemic Event  CBG: 64  Treatment: 15 GM carbohydrate snack  Symptoms: None  Follow-up CBG: Time:0715 CBG Result:87  Possible Reasons for Event: Inadequate meal intake  Comments/MD notified:no    Nicholas [name]  Remember to initiate Hypoglycemia Order Set & complete

## 2013-08-29 NOTE — Progress Notes (Addendum)
[facility] Progress Note  08/29/2013 9:15 AM POD 2  Subjective:  Sleepy this morning; c/o pain during dressing change  Tm 99.2 now afebrile  VSS 97% RA  Filed Vitals:   08/29/13 0500  BP: 123/46  Pulse: 58  Temp: 97.4 F (36.3 C)  Resp: 20    Physical Exam: Incisions:  C/d/i with staples in tact.   CBC    Component Value Date/Time   WBC 8.7 08/29/2013 0437   RBC 3.63* 08/29/2013 0437   HGB 9.3* 08/29/2013 0437   HCT 30.6* 08/29/2013 0437   PLT 286 08/29/2013 0437   MCV 84.3 08/29/2013 0437   MCH 25.6* 08/29/2013 0437   MCHC 30.4 08/29/2013 0437   RDW 15.4 08/29/2013 0437   LYMPHSABS 2.0 08/25/2013 0425   MONOABS 0.6 08/25/2013 0425   EOSABS 0.3 08/25/2013 0425   BASOSABS 0.0 08/25/2013 0425    BMET    Component Value Date/Time   NA 140 08/29/2013 0437   K 4.4 08/29/2013 0437   CL 99 08/29/2013 0437   CO2 29 08/29/2013 0437   GLUCOSE 76 08/29/2013 0437   BUN 10 08/29/2013 0437   CREATININE 1.18 08/29/2013 0437   CALCIUM 8.6 08/29/2013 0437   GFRNONAA 60* 08/29/2013 0437   GFRAA 70* 08/29/2013 0437    INR    Component Value Date/Time   INR 1.08 05/30/2013 1146     Intake/Output Summary (Last 24 hours) at 08/29/13 0915 Last data filed at 08/29/13 0700  Gross per 24 hour  Intake    520 ml  Output   1690 ml  Net  -1170 ml     Assessment/Plan:  73 y.o. male is s/p left above knee amputation  POD 2  -Await CIR recommendations/consult -d/c drain -d/c ABx as infection/cellulitis is resolved with amputation. -dressing changed and drain removed-left stump is viable.   Doreatha MassedSamantha Rhyne, PA-C [facility] [phone] 08/29/2013 9:15 AM           I have examined the patient, reviewed and agree with above.  Arvine Clayburn, MD 08/29/2013 10:27 AM

## 2013-08-29 NOTE — Plan of Care (Signed)
Problem: Phase I Progression Outcomes Goal: Pain controlled with appropriate interventions Outcome: Progressing Patient pain primarily controlled by oral pain medications with supplemental IV morphine with dressing changes.  Problem: Phase II Progression Outcomes Goal: Return of bowel function (flatus, BM) IF ABDOMINAL SURGERY:  Outcome: Completed/Met Date Met:  08/29/13 Patient had large soft yellow stool. Goal: Discharge plan established Outcome: Progressing Patient tentatively planning to progress to CIR prior to discharge to home. Goal: Tolerating diet Outcome: Not Progressing Patient refuses to eat much if any of meals presented to him at mealtimes.

## 2013-08-29 NOTE — Progress Notes (Signed)
Assessment/Plan: Principal Problem:   Cellulitis of foot - doing well so far except for pain with dressing change. Order in for CIR consult.  Active Problems:   Type 2 diabetes mellitus with vascular disease - is starting to eat so will continue current DM meds.    Hypertensive heart disease without CHF   PAD (peripheral artery disease)   CAD (coronary artery disease), native coronary artery   Atherosclerosis of native arteries of the extremities with ulceration(440.23)   MRSA (methicillin resistant Staphylococcus aureus) infection   Sacral decubitus ulcer, stage IV   Subjective: Just had dressing change and is uncomfortable. Otherwise no real changes. Did eat some yesterday. Had a hypoglycemic event.   Objective:  Vital Signs: Filed Vitals:   08/28/13 1457 08/28/13 1832 08/28/13 2126 08/29/13 0500  BP: 129/64 147/73 155/54 123/46  Pulse: 62 65 61 58  Temp: 99.1 F (37.3 C) 94.1 F (34.5 C) 98.1 F (36.7 C) 97.4 F (36.3 C)  TempSrc: Oral Oral Oral Oral  Resp: 18 18 18 20   Height:      Weight:    116.3 kg (256 lb 6.3 oz)  SpO2: 97% 98% 100% 97%     EXAM: alert. No tachypnea   Intake/Output Summary (Last 24 hours) at 08/29/13 0948 Last data filed at 08/29/13 0700  Gross per 24 hour  Intake    520 ml  Output   1690 ml  Net  -1170 ml    Lab Results:  Recent Labs  08/28/13 0439 08/29/13 0437  NA 139 140  K 4.6 4.4  CL 99 99  CO2 28 29  GLUCOSE 131* 76  BUN 11 10  CREATININE 1.03 1.18  CALCIUM 8.4 8.6   No results found for this basename: AST, ALT, ALKPHOS, BILITOT, PROT, ALBUMIN,  in the last 72 hours No results found for this basename: LIPASE, AMYLASE,  in the last 72 hours  Recent Labs  08/28/13 0439 08/29/13 0437  WBC 9.6 8.7  HGB 10.0* 9.3*  HCT 32.8* 30.6*  MCV 83.2 84.3  PLT 314 286   No results found for this basename: CKTOTAL, CKMB, CKMBINDEX, TROPONINI,  in the last 72 hours BNP    Component Value Date/Time   PROBNP 918.5* 08/26/2013  1130   No results found for this basename: DDIMER,  in the last 72 hours No results found for this basename: HGBA1C,  in the last 72 hours No results found for this basename: CHOL, HDL, LDLCALC, TRIG, CHOLHDL, LDLDIRECT,  in the last 72 hours No results found for this basename: TSH, T4TOTAL, FREET3, T3FREE, THYROIDAB,  in the last 72 hours No results found for this basename: VITAMINB12, FOLATE, FERRITIN, TIBC, IRON, RETICCTPCT,  in the last 72 hours  Studies/Results: No results found. Medications: Medications administered in the last 24 hours reviewed.  Current Medication List reviewed.    LOS: 5 days   [facility] HospitalBORNE,Keontre CHARLES [facility] [phone]) 08/29/2013, 9:48 AM

## 2013-08-30 ENCOUNTER — Inpatient Hospital Stay (HOSPITAL_COMMUNITY)
Admission: RE | Admit: 2013-08-30 | Discharge: 2013-09-08 | DRG: 945 | Disposition: A | Discharge status: Home Health | Payer: Medicare Other | Source: Intra-hospital | Attending: Physical Medicine & Rehabilitation | Admitting: Physical Medicine & Rehabilitation

## 2013-08-30 DIAGNOSIS — E1149 Type 2 diabetes mellitus with other diabetic neurological complication: Secondary | ICD-10-CM

## 2013-08-30 DIAGNOSIS — I739 Peripheral vascular disease, unspecified: Secondary | ICD-10-CM | POA: Diagnosis present

## 2013-08-30 DIAGNOSIS — L98499 Non-pressure chronic ulcer of skin of other sites with unspecified severity: Secondary | ICD-10-CM

## 2013-08-30 DIAGNOSIS — N400 Enlarged prostate without lower urinary tract symptoms: Secondary | ICD-10-CM | POA: Diagnosis present

## 2013-08-30 DIAGNOSIS — Z5189 Encounter for other specified aftercare: Principal | ICD-10-CM

## 2013-08-30 DIAGNOSIS — D62 Acute posthemorrhagic anemia: Secondary | ICD-10-CM

## 2013-08-30 DIAGNOSIS — E785 Hyperlipidemia, unspecified: Secondary | ICD-10-CM | POA: Diagnosis present

## 2013-08-30 DIAGNOSIS — E1142 Type 2 diabetes mellitus with diabetic polyneuropathy: Secondary | ICD-10-CM | POA: Diagnosis present

## 2013-08-30 DIAGNOSIS — I10 Essential (primary) hypertension: Secondary | ICD-10-CM | POA: Diagnosis present

## 2013-08-30 DIAGNOSIS — S78119A Complete traumatic amputation at level between unspecified hip and knee, initial encounter: Secondary | ICD-10-CM

## 2013-08-30 DIAGNOSIS — Z951 Presence of aortocoronary bypass graft: Secondary | ICD-10-CM

## 2013-08-30 DIAGNOSIS — M109 Gout, unspecified: Secondary | ICD-10-CM | POA: Diagnosis present

## 2013-08-30 DIAGNOSIS — K5900 Constipation, unspecified: Secondary | ICD-10-CM | POA: Diagnosis present

## 2013-08-30 DIAGNOSIS — Z89619 Acquired absence of unspecified leg above knee: Secondary | ICD-10-CM

## 2013-08-30 DIAGNOSIS — F411 Generalized anxiety disorder: Secondary | ICD-10-CM | POA: Diagnosis present

## 2013-08-30 DIAGNOSIS — I509 Heart failure, unspecified: Secondary | ICD-10-CM | POA: Diagnosis present

## 2013-08-30 DIAGNOSIS — I5022 Chronic systolic (congestive) heart failure: Secondary | ICD-10-CM | POA: Diagnosis present

## 2013-08-30 DIAGNOSIS — I2510 Atherosclerotic heart disease of native coronary artery without angina pectoris: Secondary | ICD-10-CM | POA: Diagnosis present

## 2013-08-30 DIAGNOSIS — S88119A Complete traumatic amputation at level between knee and ankle, unspecified lower leg, initial encounter: Secondary | ICD-10-CM

## 2013-08-30 LAB — GLUCOSE, CAPILLARY
GLUCOSE-CAPILLARY: 84 mg/dL (ref 70–99)
Glucose-Capillary: 101 mg/dL — ABNORMAL HIGH (ref 70–99)
Glucose-Capillary: 133 mg/dL — ABNORMAL HIGH (ref 70–99)
Glucose-Capillary: 137 mg/dL — ABNORMAL HIGH (ref 70–99)

## 2013-08-30 MED ORDER — SORBITOL 70 % SOLN
30.0000 mL | Freq: Every day | Status: DC | PRN
  Administered 2013-09-03: 30 mL via ORAL
  Filled 2013-08-30: qty 30

## 2013-08-30 MED ORDER — TIMOLOL MALEATE 0.5 % OP SOLN
1.0000 [drp] | Freq: Every day | OPHTHALMIC | Status: DC
  Administered 2013-08-30 – 2013-09-07 (×9): 1 [drp] via OPHTHALMIC
  Filled 2013-08-30: qty 5

## 2013-08-30 MED ORDER — PANTOPRAZOLE SODIUM 40 MG PO TBEC
40.0000 mg | DELAYED_RELEASE_TABLET | Freq: Every day | ORAL | Status: DC
  Administered 2013-08-31 – 2013-09-08 (×9): 40 mg via ORAL
  Filled 2013-08-30 (×10): qty 1

## 2013-08-30 MED ORDER — ENOXAPARIN SODIUM 40 MG/0.4ML ~~LOC~~ SOLN: 40.0000 mg | SUBCUTANEOUS | Status: DC

## 2013-08-30 MED ORDER — ALPRAZOLAM 0.5 MG PO TABS
2.0000 mg | ORAL_TABLET | Freq: Every day | ORAL | Status: DC
  Administered 2013-08-30: 2 mg via ORAL
  Filled 2013-08-30: qty 4

## 2013-08-30 MED ORDER — INSULIN GLARGINE 100 UNIT/ML ~~LOC~~ SOLN: 5.0000 [IU] | Freq: Every day | SUBCUTANEOUS | Status: DC

## 2013-08-30 MED ORDER — ONDANSETRON HCL 4 MG PO TABS: 4.0000 mg | ORAL_TABLET | Freq: Four times a day (QID) | ORAL | Status: DC | PRN

## 2013-08-30 MED ORDER — SIMVASTATIN 20 MG PO TABS
20.0000 mg | ORAL_TABLET | Freq: Every evening | ORAL | Status: DC
  Administered 2013-08-30 – 2013-09-07 (×9): 20 mg via ORAL
  Filled 2013-08-30 (×10): qty 1

## 2013-08-30 MED ORDER — ZOLPIDEM TARTRATE 5 MG PO TABS
5.0000 mg | ORAL_TABLET | Freq: Every evening | ORAL | Status: DC | PRN
  Administered 2013-08-31 – 2013-09-07 (×6): 5 mg via ORAL
  Filled 2013-08-30 (×8): qty 1

## 2013-08-30 MED ORDER — ACETAMINOPHEN 325 MG PO TABS: 325.0000 mg | ORAL_TABLET | ORAL | Status: DC | PRN

## 2013-08-30 MED ORDER — TAMSULOSIN HCL 0.4 MG PO CAPS
0.8000 mg | ORAL_CAPSULE | Freq: Every day | ORAL | Status: DC
  Administered 2013-08-30 – 2013-09-07 (×9): 0.8 mg via ORAL
  Filled 2013-08-30 (×10): qty 2

## 2013-08-30 MED ORDER — BRIMONIDINE TARTRATE 0.2 % OP SOLN
1.0000 [drp] | Freq: Every day | OPHTHALMIC | Status: DC
  Administered 2013-08-30 – 2013-09-07 (×9): 1 [drp] via OPHTHALMIC
  Filled 2013-08-30: qty 5

## 2013-08-30 MED ORDER — ONDANSETRON HCL 4 MG/2ML IJ SOLN: 4.0000 mg | Freq: Four times a day (QID) | INTRAMUSCULAR | Status: DC | PRN

## 2013-08-30 MED ORDER — GLIPIZIDE 5 MG PO TABS
5.0000 mg | ORAL_TABLET | Freq: Two times a day (BID) | ORAL | Status: DC
  Administered 2013-08-30 – 2013-09-08 (×18): 5 mg via ORAL
  Filled 2013-08-30 (×20): qty 1

## 2013-08-30 MED ORDER — ISOSORBIDE MONONITRATE ER 30 MG PO TB24
30.0000 mg | ORAL_TABLET | Freq: Every day | ORAL | Status: DC
  Administered 2013-08-31 – 2013-09-08 (×9): 30 mg via ORAL
  Filled 2013-08-30 (×10): qty 1

## 2013-08-30 MED ORDER — HYDROCODONE-ACETAMINOPHEN 5-325 MG PO TABS
1.0000 | ORAL_TABLET | ORAL | Status: DC | PRN
  Administered 2013-08-30 – 2013-09-08 (×19): 1 via ORAL
  Filled 2013-08-30 (×21): qty 1

## 2013-08-30 MED ORDER — ALLOPURINOL 300 MG PO TABS
300.0000 mg | ORAL_TABLET | Freq: Every day | ORAL | Status: DC
  Administered 2013-08-30 – 2013-09-08 (×10): 300 mg via ORAL
  Filled 2013-08-30 (×12): qty 1

## 2013-08-30 MED ORDER — ATENOLOL 50 MG PO TABS
50.0000 mg | ORAL_TABLET | Freq: Two times a day (BID) | ORAL | Status: DC
  Administered 2013-08-30 – 2013-09-08 (×17): 50 mg via ORAL
  Filled 2013-08-30 (×21): qty 1

## 2013-08-30 MED ORDER — CLOPIDOGREL BISULFATE 75 MG PO TABS
75.0000 mg | ORAL_TABLET | Freq: Every day | ORAL | Status: DC
  Administered 2013-08-31 – 2013-09-08 (×9): 75 mg via ORAL
  Filled 2013-08-30 (×10): qty 1

## 2013-08-30 MED ORDER — INSULIN ASPART 100 UNIT/ML ~~LOC~~ SOLN
0.0000 [IU] | Freq: Three times a day (TID) | SUBCUTANEOUS | Status: DC
  Administered 2013-08-31: 2 [IU] via SUBCUTANEOUS
  Administered 2013-09-01 – 2013-09-04 (×5): 1 [IU] via SUBCUTANEOUS
  Administered 2013-09-04: 2 [IU] via SUBCUTANEOUS
  Administered 2013-09-05 – 2013-09-07 (×5): 1 [IU] via SUBCUTANEOUS

## 2013-08-30 MED ORDER — FUROSEMIDE 80 MG PO TABS
80.0000 mg | ORAL_TABLET | Freq: Every day | ORAL | Status: DC
  Administered 2013-08-30 – 2013-09-08 (×10): 80 mg via ORAL
  Filled 2013-08-30 (×11): qty 1

## 2013-08-30 MED ORDER — POTASSIUM CHLORIDE CRYS ER 20 MEQ PO TBCR
40.0000 meq | EXTENDED_RELEASE_TABLET | Freq: Two times a day (BID) | ORAL | Status: DC
  Administered 2013-08-30 – 2013-09-08 (×18): 40 meq via ORAL
  Filled 2013-08-30 (×21): qty 2

## 2013-08-30 MED ORDER — ASPIRIN EC 81 MG PO TBEC
81.0000 mg | DELAYED_RELEASE_TABLET | Freq: Every day | ORAL | Status: DC
  Administered 2013-08-31 – 2013-09-08 (×9): 81 mg via ORAL
  Filled 2013-08-30 (×11): qty 1

## 2013-08-30 MED ORDER — GUAIFENESIN-DM 100-10 MG/5ML PO SYRP: 15.0000 mL | ORAL_SOLUTION | ORAL | Status: DC | PRN

## 2013-08-30 MED ORDER — ENOXAPARIN SODIUM 40 MG/0.4ML ~~LOC~~ SOLN
40.0000 mg | SUBCUTANEOUS | Status: DC
  Administered 2013-08-31 – 2013-09-08 (×9): 40 mg via SUBCUTANEOUS
  Filled 2013-08-30 (×9): qty 0.4

## 2013-08-30 MED ORDER — ALUM & MAG HYDROXIDE-SIMETH 200-200-20 MG/5ML PO SUSP: 15.0000 mL | ORAL | Status: DC | PRN

## 2013-08-30 MED ORDER — INSULIN GLARGINE 100 UNIT/ML ~~LOC~~ SOLN
5.0000 [IU] | Freq: Every day | SUBCUTANEOUS | Status: DC
  Administered 2013-08-31 – 2013-09-08 (×9): 5 [IU] via SUBCUTANEOUS
  Filled 2013-08-30 (×9): qty 0.05

## 2013-08-30 MED ORDER — LATANOPROST 0.005 % OP SOLN
1.0000 [drp] | Freq: Every day | OPHTHALMIC | Status: DC
  Administered 2013-08-30 – 2013-09-07 (×9): 1 [drp] via OPHTHALMIC
  Filled 2013-08-30: qty 2.5

## 2013-08-30 MED ORDER — NITROGLYCERIN 0.4 MG SL SUBL: 0.4000 mg | SUBLINGUAL_TABLET | SUBLINGUAL | Status: DC | PRN

## 2013-08-30 MED ORDER — ALPRAZOLAM 0.5 MG PO TABS
0.5000 mg | ORAL_TABLET | ORAL | Status: DC
  Administered 2013-08-31 – 2013-09-08 (×16): 0.5 mg via ORAL
  Filled 2013-08-30 (×16): qty 1

## 2013-08-30 MED ORDER — DOCUSATE SODIUM 100 MG PO CAPS
100.0000 mg | ORAL_CAPSULE | Freq: Every day | ORAL | Status: DC
  Administered 2013-08-31 – 2013-09-08 (×8): 100 mg via ORAL
  Filled 2013-08-30 (×10): qty 1

## 2013-08-30 NOTE — Progress Notes (Signed)
Patient ID: Sherilyn CooterJames D Sewell, male   DOB: 08/04/1941, 73 y.o.   MRN: [id number] Vascular Surgery Progress Note  Subjective: 3 days post left AKA for gangrenous ulcer left foot with cellulitis-non-reconstructable vascular occlusive disease  Objective:  Filed Vitals:   08/30/13 0618  BP: 129/54  Pulse: 57  Temp: 97.9 F (36.6 C)  Resp: 20    General alert and oriented x3 in no apparent stress Lungs no rhonchi wheezing Left AKA stump examined and healing nicely. No evidence of infection. Patient states pain control much better   Labs:  Recent Labs Lab 08/27/13 0440 08/28/13 0439 08/29/13 0437  CREATININE 1.17 1.03 1.18    Recent Labs Lab 08/27/13 0440 08/28/13 0439 08/29/13 0437  NA 140 139 140  K 4.6 4.6 4.4  CL 101 99 99  CO2 28 28 29   BUN 11 11 10   CREATININE 1.17 1.03 1.18  GLUCOSE 131* 131* 76  CALCIUM 8.7 8.4 8.6    Recent Labs Lab 08/27/13 0440 08/28/13 0439 08/29/13 0437  WBC 9.2 9.6 8.7  HGB 11.2* 10.0* 9.3*  HCT 36.6* 32.8* 30.6*  PLT 301 314 286   No results found for this basename: INR,  in the last 168 hours  I/O last 3 completed shifts: In: 520 [P.O.:320; IV Piggyback:200] Out: 1690 [Urine:1675; Drains:15]  Imaging: No results found.  Assessment/Plan:  POD #3  LOS: 6 days  s/p Procedure(s): LEFT ABOVE THE KNEE AMPUTATION   Doing well post left AKA 3 days ago with stump healing nicely Skin staples will remain in place for 3 weeks Will continue to follow with you DC plans?? Possible CIR??   Josephina GipJames Lawson, MD 08/30/2013 8:58 AM

## 2013-08-30 NOTE — H&P (View-Only) (Signed)
Physical Medicine and Rehabilitation Admission H&P    Chief Complaint  Patient presents with  . Wound Infection  :  Chief complaint: Leg pain  HPI: Nicholas [name] is a 73 y.o. right-handed male with history of diabetes mellitus or peripheral neuropathy, coronary artery disease and CABG, systolic congestive heart failure, peripheral vascular disease revascularization procedures. Patient used a walker prior to admission. Admitted 08/24/2013 with nonhealing left foot ulcer. Left foot films shows soft tissue ulceration of the heel region no osteomyelitis. No change with conservative care and limb was not felt to be salvageable. 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 Kellie Simmering. Postoperative pain control. Acute blood loss anemia 9.3 and monitored. MRSA PCR screening positive with contact precautions. Physical and occupational therapy evaluations completed with recommendations for physical medicine rehabilitation consult. Patient was admitted for comprehensive rehabilitation program   ROS Review of Systems  Cardiovascular: Positive for leg swelling.  Musculoskeletal: Positive for joint pain and myalgias.  Neurological: Positive for seizures.  Psychiatric/Behavioral: Positive for depression. The patient has insomnia.  Anxiety  All other systems reviewed and are negative  Past Medical History  Diagnosis Date  . Hypertension   . Peripheral vascular disease   . Ulcer     left foot  . Dyslipidemia   . Chronic venous insufficiency   . Sleep apnea   . Morbid obesity   . Diverticulosis   . Benign prostatic hypertrophy   . Glaucoma   . Gout   . Depression   . Insomnia   . CAD (coronary artery disease), native coronary artery   . GERD (gastroesophageal reflux disease)   . Angina   . Pneumonia 12/11/11  . Type II diabetes mellitus   . Chronic daily headache     "~ all the time"  . Seizures   . Stroke ~ 1983    "little weaker right hand"  . MRSA (methicillin  resistant staph aureus) culture positive Dec. 17, 2013     right heel infection   Past Surgical History  Procedure Laterality Date  . Appendectomy    . Lumbar laminectomy    . Mastoid debridement    . Knee and ankle surgery  1979    "caved in in a ditch; messed up legs"; right  . Cholecystectomy    . Back surgery      "5 or 6"  . Peripheral arterial stent graft      "don't remember which side"  . Coronary artery bypass graft  1983; 1992    "CABG X 5; CABG X 8"  . Lower extremity angiogram  01/21/2012    Procedure: LOWER EXTREMITY ANGIOGRAM;  Surgeon: Serafina Mitchell, MD;  Location: [facility] OR;  Service: Vascular;  Laterality: Bilateral;  WITH POSSIBLE INTERVENTION  . Eye surgery      "laser; both eyes"  . Eye surgery  05/08/2012    Cataract Left eye  . Heel bx  Dec. 17, 2013    Right Heel Bx.    . Transluminal atherectomy popliteal artery  01/21/2012    Left   Family History  Problem Relation Age of Onset  . Hypertension Other   . Cancer Other   . Cancer Sister   . Deep vein thrombosis Brother   . Heart disease Brother   . Deep vein thrombosis Brother   . Heart disease Brother   . Cancer Sister    Social History:  reports that he quit smoking about 22 years ago. His smoking use included Cigarettes. He  has a 20 pack-year smoking history. He quit smokeless tobacco use about 26 years ago. His smokeless tobacco use included Chew. He reports that he does not drink alcohol or use illicit drugs. Allergies:  Allergies  Allergen Reactions  . Contrast Media [Iodinated Diagnostic Agents] Other (See Comments)    Shaking and feeling terrible   Medications Prior to Admission  Medication Sig Dispense Refill  . allopurinol (ZYLOPRIM) 300 MG tablet Take 300 mg by mouth every morning.       Marland Kitchen ALPRAZolam (XANAX) 1 MG tablet Take 0.5-2 mg by mouth 3 (three) times daily. Takes half tablet in the morning and at 430 pm and 2 tablets at bedtime.      Marland Kitchen aspirin EC 81 MG EC tablet Take 1 tablet (81  mg total) by mouth daily.  100 tablet  3  . atenolol (TENORMIN) 50 MG tablet Take 50 mg by mouth 2 (two) times daily.       . brimonidine-timolol (COMBIGAN) 0.2-0.5 % ophthalmic solution Place 1 drop into both eyes at bedtime.       . clopidogrel (PLAVIX) 75 MG tablet Take 75 mg by mouth every morning.       . diclofenac sodium (VOLTAREN) 1 % GEL Apply 1 application topically 4 (four) times daily as needed. Applies to both knees as needed for pain      . furosemide (LASIX) 80 MG tablet Take 1 tablet (80 mg total) by mouth every morning.  90 tablet  3  . glipiZIDE (GLUCOTROL) 5 MG tablet Take 1 tablet (5 mg total) by mouth 2 (two) times daily before a meal.  60 tablet  11  . HYDROcodone-acetaminophen (NORCO/VICODIN) 5-325 MG per tablet Take 1 tablet by mouth every 4 (four) hours as needed.  120 tablet  0  . insulin glargine (LANTUS) 100 UNIT/ML injection Inject 10-40 Units into the skin 2 (two) times daily as needed. Per sliding scale in divided doses. May use up to 140 units/day. Usual dose is 5-10 units      . isosorbide mononitrate (IMDUR) 30 MG 24 hr tablet Take 30 mg by mouth every morning.       . latanoprost (XALATAN) 0.005 % ophthalmic solution Place 1 drop into both eyes at bedtime.        . nitroGLYCERIN (NITROSTAT) 0.4 MG SL tablet Place 0.4 mg under the tongue every 5 (five) minutes as needed. For chest pain      . omeprazole (PRILOSEC) 20 MG capsule Take 20 mg by mouth 2 (two) times daily.       . potassium chloride SA (K-DUR,KLOR-CON) 20 MEQ tablet Take 2 tablets (40 mEq total) by mouth 2 (two) times daily.  120 tablet  5  . simvastatin (ZOCOR) 20 MG tablet Take 20 mg by mouth every evening.       . tamsulosin (FLOMAX) 0.4 MG CAPS capsule Take 0.8 mg by mouth at bedtime.       . temazepam (RESTORIL) 30 MG capsule Take 30 mg by mouth at bedtime.         Home: Home Living Family/patient expects to be discharged to:: Inpatient rehab Living Arrangements: Spouse/significant  other Available Help at Discharge: Family;Available 24 hours/day Type of Home: House Home Access: Stairs to enter [employer] of Steps: 2 Entrance Stairs-Rails: None Home Layout: One level Home Equipment: Walker - 2 wheels;Wheelchair - Teacher, adult education History:    Functional Status:  Mobility:  ADL: ADL Eating/Feeding: Simulated;Modified independent Where Assessed - Eating/Feeding: Bed level Grooming: Performed;Wash/dry hands;Wash/dry face Where Assessed - Grooming: Supine, head of bed up Upper Body Bathing: Simulated;Supervision/safety;Set up Where Assessed - Upper Body Bathing: Supine, head of bed up Lower Body Bathing: Simulated;Maximal assistance Where Assessed - Lower Body Bathing: Rolling right and/or left;Supine, head of bed up Upper Body Dressing: Performed;Minimal assistance Where Assessed - Upper Body Dressing: Supine, head of bed up Lower Body Dressing: Simulated;+1 Total assistance Where Assessed - Lower Body Dressing: Supine, head of bed up;Supine, head of bed flat;Rolling right and/or left ADL Comments:  (Pt. family was assiting more than necassary occording to pt.)  Cognition: Cognition Overall Cognitive Status: Within Functional Limits for tasks assessed Orientation Level: Oriented X4 Cognition Arousal/Alertness: Awake/alert Behavior During Therapy: WFL for tasks assessed/performed Overall Cognitive Status: Within Functional Limits for tasks assessed  Physical Exam: Blood pressure 129/54, pulse 57, temperature 97.9 F (36.6 C), temperature source Oral, resp. rate 20, height _0  (1.778 m), weight 112 kg (246 lb 14.6 oz), SpO2 95.00%. Physical Exam Constitutional: He appears well-developed. No distress.  73 year old obese male  HENT:  Head: Normocephalic and atraumatic.  Right Ear: External ear normal.  Left Ear: External ear normal.  Eyes: Conjunctivae and EOM are normal. Pupils are equal, round, and reactive to  light.  Neck: Normal range of motion. Neck supple. No JVD present. No tracheal deviation present. No thyromegaly present.  Cardiovascular: Normal rate, regular rhythm and normal heart sounds. Exam reveals no gallop and no friction rub.  No murmur heard.  Respiratory: Effort normal and breath sounds normal. No respiratory distress. He has no wheezes. He has no rales. He exhibits no tenderness.  GI: Soft. Bowel sounds are normal. He exhibits no distension. There is no tenderness. There is no rebound.  Musculoskeletal:  Left leg edematous. Right foot with pes planus, degenerative changes.  Lymphadenopathy:  He has no cervical adenopathy.  Neurological:  Patient is appropriate. He does answer basic questions. Wife is at bedside. Decreased PP and LT below the knee on the right leg. Sensory lost in fingers and hans as well.  Amputation site with staples intact without drainage and appropriately tender  Psychiatric: He has a normal mood and affect. His behavior is normal. Thought content normal  Results for orders placed during the hospital encounter of 08/24/13 (from the past 48 hour(s))  GLUCOSE, CAPILLARY     Status: Abnormal   Collection Time    08/28/13 11:52 AM      Result Value Range   Glucose-Capillary 133 (*) 70 - 99 mg/dL  GLUCOSE, CAPILLARY     Status: Abnormal   Collection Time    08/28/13  4:40 PM      Result Value Range   Glucose-Capillary 175 (*) 70 - 99 mg/dL  GLUCOSE, CAPILLARY     Status: Abnormal   Collection Time    08/28/13  9:07 PM      Result Value Range   Glucose-Capillary 132 (*) 70 - 99 mg/dL   Comment 1 Documented in Chart     Comment 2 Notify RN    BASIC METABOLIC PANEL     Status: Abnormal   Collection Time    08/29/13  4:37 AM      Result Value Range   Sodium 140  137 - 147 mEq/L   Potassium 4.4  3.7 - 5.3 mEq/L   Chloride 99  96 - 112 mEq/L   CO2 29  19 - 32 mEq/L  Glucose, Bld 76  70 - 99 mg/dL   BUN 10  6 - 23 mg/dL   Creatinine, Ser 1.18  0.50 -  1.35 mg/dL   Calcium 8.6  8.4 - 10.5 mg/dL   GFR calc non Af Amer 60 (*) >90 mL/min   GFR calc Af Amer 70 (*) >90 mL/min   Comment: (NOTE)     The eGFR has been calculated using the CKD EPI equation.     This calculation has not been validated in all clinical situations.     eGFR's persistently <90 mL/min signify possible Chronic Kidney     Disease.  CBC     Status: Abnormal   Collection Time    08/29/13  4:37 AM      Result Value Range   WBC 8.7  4.0 - 10.5 K/uL   RBC 3.63 (*) 4.22 - 5.81 MIL/uL   Hemoglobin 9.3 (*) 13.0 - 17.0 g/dL   HCT 30.6 (*) 39.0 - 52.0 %   MCV 84.3  78.0 - 100.0 fL   MCH 25.6 (*) 26.0 - 34.0 pg   MCHC 30.4  30.0 - 36.0 g/dL   RDW 15.4  11.5 - 15.5 %   Platelets 286  150 - 400 K/uL  GLUCOSE, CAPILLARY     Status: Abnormal   Collection Time    08/29/13  6:42 AM      Result Value Range   Glucose-Capillary 64 (*) 70 - 99 mg/dL   Comment 1 Documented in Chart     Comment 2 Notify RN    GLUCOSE, CAPILLARY     Status: None   Collection Time    08/29/13  7:14 AM      Result Value Range   Glucose-Capillary 87  70 - 99 mg/dL   Comment 1 Documented in Chart     Comment 2 Notify RN    GLUCOSE, CAPILLARY     Status: Abnormal   Collection Time    08/29/13 11:34 AM      Result Value Range   Glucose-Capillary 108 (*) 70 - 99 mg/dL  GLUCOSE, CAPILLARY     Status: Abnormal   Collection Time    08/29/13  4:41 PM      Result Value Range   Glucose-Capillary 131 (*) 70 - 99 mg/dL  GLUCOSE, CAPILLARY     Status: Abnormal   Collection Time    08/29/13 10:07 PM      Result Value Range   Glucose-Capillary 107 (*) 70 - 99 mg/dL  GLUCOSE, CAPILLARY     Status: Abnormal   Collection Time    08/30/13  6:58 AM      Result Value Range   Glucose-Capillary 101 (*) 70 - 99 mg/dL   No results found.  Post Admission Physician Evaluation: 1. Functional deficits secondary  to left AKA. 2. Patient is admitted to receive collaborative, interdisciplinary care between the  physiatrist, rehab nursing staff, and therapy team. 3. Patient's level of medical complexity and substantial therapy needs in context of that medical necessity cannot be provided at a lesser intensity of care such as a SNF. 4. Patient has experienced substantial functional loss from his/her baseline which was documented above under the "Functional History" and "Functional Status" headings.  Judging by the patient's diagnosis, physical exam, and functional history, the patient has potential for functional progress which will result in measurable gains while on inpatient rehab.  These gains will be of substantial and practical use upon discharge  in facilitating  mobility and self-care at the household level. 5. Physiatrist will provide 24 hour management of medical needs as well as oversight of the therapy plan/treatment and provide guidance as appropriate regarding the interaction of the two. 6. 24 hour rehab nursing will assist with bladder management, bowel management, safety, skin/wound care, disease management, medication administration, pain management and patient education  and help integrate therapy concepts, techniques,education, etc. 7. PT will assess and treat for/with: Lower extremity strength, range of motion, stamina, balance, functional mobility, safety, adaptive techniques and equipment, pre-prosthetic education, pain mgt, wound care.   Goals are: mod I. 8. OT will assess and treat for/with: ADL's, functional mobility, safety, upper extremity strength, adaptive techniques and equipment, pre-prosthetic education, pain mgt.   Goals are: mod I. 9. SLP will assess and treat for/with: n/a .  Goals are: n/a. 10. Case Management and Social Worker will assess and treat for psychological issues and discharge planning. 11. Team conference will be held weekly to assess progress toward goals and to determine barriers to discharge. 12. Patient will receive at least 3 hours of therapy per day at least 5  days per week. 13. ELOS: 10-12 days       14. Prognosis:  excellent   Medical Problem List and Plan: 1. 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 DVT Prophylaxis/Anticoagulation: Subcutaneous Lovenox. Monitor platelet counts any signs of bleeding 3. Pain Management: Hydrocodone as needed. Monitor with increased mobility 4. Mood/anxiety. Xanax 0.5 mg twice a day and 2 mg each bedtime. Provide emotional support 5. Neuropsych: This patient is capable of making decisions on his own behalf. 6. Acute blood loss anemia. Latest hemoglobin 9.3. Followup CBC 7. Coronary artery disease with CABG. Continue aspirin and Plavix therapy. No chest pain or shortness of breath. 8. Diabetes mellitus with peripheral neuropathy. Hemoglobin A1c 7.4. Glucotrol 5 mg twice a day, Lantus insulin 5 units daily. Check blood sugars a.c. and at bedtime. 9. Hypertension. Tenormin 50 mg twice a day, Imdur 30 mg daily. Monitor with increased mobility 10. Systolic congestive heart failure. Continue Lasix. Monitor for any signs of fluid overload. 11. Hyperlipidemia. Zocor. 12. BPH. Flomax 0.8 mg each bedtime. Check PVRs x3 13. Gout. Zyloprim 300 mg daily. Monitor for any signs gout flare up  Meredith Staggers, MD, [facility]   08/30/2013

## 2013-08-30 NOTE — PMR Pre-admission (Signed)
PMR Admission Coordinator Pre-Admission Assessment  Patient: Nicholas [name] is an 73 y.o., male MRN: [id number] DOB: Dec 26, 1940 Height: 5\' 10"  (177.8 cm) Weight: 112.8 kg (248 lb 10.9 oz)              Insurance Information HMO: no    PPO:      PCP:      IPA:      80/20:      OTHER:   PRIMARY: Medicare A & B      Policy#: [id number]      Subscriber: self  CM Name:       Phone#:      Fax#:  Pre-Cert#: verified in Facilities manager: retired Benefits:  Phone #:      Name:  Eff. Date: Part A: 10-03-93, Part B: 02-02-01     Deduct: $1260      Out of Pocket Max: none Life Max: unlimited CIR: 100%      SNF: 100% days 1-20; 80% days 21-100 (100 day visit limits) Outpatient: 80%     Co-Pay: 20% Home Health: 100%      Co-Pay: none (no visit limits) DME: 80%     Co-Pay: 20% Providers: pt's preference  SECONDARYRaphael Gibney of [facility]      Policy#: [id number]      Subscriber: self  CM Name:       Phone#: [phone]     Fax#:  Pre-Cert#:       Employer:  Benefits:  Phone #:      Name:  Eff. Date:      Deduct:       Out of Pocket Max:       Life Max:  CIR:       SNF:  Outpatient:      Co-Pay:  Home Health:       Co-Pay:  DME:      Co-Pay:    Emergency Contact Information Contact Information   Name Relation Home Work [place] [phone]  [phone]     Current Medical History  Patient Admitting Diagnosis: L AKA History of Present Illness: Nicholas [name] is a 73 y.o. right-handed male with history of diabetes mellitus or peripheral neuropathy, coronary artery disease and CABG, systolic congestive heart failure, peripheral vascular disease revascularization procedures. Patient used a walker prior to admission. Admitted 08/24/2013 with nonhealing left foot ulcer. Left foot films shows soft tissue ulceration of the heel region no osteomyelitis. No change with conservative care and limb was not felt to be salvageable. Patient underwent left above-knee amputation  08/27/2013 per Dr. Hart [place]. Postoperative pain control. Acute blood loss anemia 9.3 and monitored. MRSA PCR screening positive with contact precautions. Physical therapy evaluation completed 08/28/2013 with recommendations for physical medicine rehabilitation consult.   Past Medical History  Past Medical History  Diagnosis Date  . Hypertension   . Peripheral vascular disease   . Ulcer     left foot  . Dyslipidemia   . Chronic venous insufficiency   . Sleep apnea   . Morbid obesity   . Diverticulosis   . Benign prostatic hypertrophy   . Glaucoma   . Gout   . Depression   . Insomnia   . CAD (coronary artery disease), native coronary artery   . GERD (gastroesophageal reflux disease)   . Angina   . Pneumonia 12/11/11  . Type II diabetes mellitus   . Chronic daily headache     "~  all the time"  . Seizures   . Stroke ~ 1983    "little weaker right hand"  . MRSA (methicillin resistant staph aureus) culture positive Dec. 17, 2013     right heel infection    Family History  family history includes Cancer in his other, sister, and sister; Deep vein thrombosis in his brother and brother; Heart disease in his brother and brother; Hypertension in his other.  Prior Rehab/Hospitalizations: [facility] after MI approx 3 weeks ago then [facility]. Previously had home health RN and PT prior to MI as well.  Current Medications  Current facility-administered medications:0.9 %  sodium chloride infusion, , Intravenous, Continuous, Emma M Collins, PA-C;  acetaminophen (TYLENOL) suppository 650 mg, 650 mg, Rectal, Q6H PRN, Eduard ClosArshad N Kakrakandy, MD;  acetaminophen (TYLENOL) tablet 650 mg, 650 mg, Oral, Q6H PRN, Eduard ClosArshad N Kakrakandy, MD;  allopurinol (ZYLOPRIM) tablet 300 mg, 300 mg, Oral, q morning - 10a, Eduard ClosArshad N Kakrakandy, MD, 300 mg at 08/30/13 1053 ALPRAZolam Prudy Feeler(XANAX) tablet 0.5 mg, 0.5 mg, Oral, Custom, Eduard ClosArshad N Kakrakandy, MD, 0.5 mg at 08/30/13  1052;  ALPRAZolam Prudy Feeler(XANAX) tablet 2 mg, 2 mg, Oral, QHS, Marden Nobleobert [facility], MD, 2 mg at 08/29/13 2217;  alum & mag hydroxide-simeth (MAALOX/MYLANTA) 200-200-20 MG/5ML suspension 15-30 mL, 15-30 mL, Oral, Q2H PRN, Lars MageEmma M Collins, PA-C;  aspirin EC tablet 81 mg, 81 mg, Oral, Daily, Eduard ClosArshad N Kakrakandy, MD, 81 mg at 08/30/13 1053 atenolol (TENORMIN) tablet 50 mg, 50 mg, Oral, BID, Eduard ClosArshad N Kakrakandy, MD, 50 mg at 08/30/13 1052;  brimonidine (ALPHAGAN) 0.2 % ophthalmic solution 1 drop, 1 drop, Both Eyes, QHS, Marden Nobleobert Gates, MD, 1 drop at 08/29/13 2217;  clopidogrel (PLAVIX) tablet 75 mg, 75 mg, Oral, Q breakfast, Eduard ClosArshad N Kakrakandy, MD, 75 mg at 08/30/13 [id number];  docusate sodium (COLACE) capsule 100 mg, 100 mg, Oral, Daily, Lars MageEmma M Collins, PA-C, 100 mg at 08/30/13 1052 enoxaparin (LOVENOX) injection 40 mg, 40 mg, Subcutaneous, Q24H, Emma M Collins, PA-C, 40 mg at 08/30/13 1052;  furosemide (LASIX) tablet 80 mg, 80 mg, Oral, q morning - 10a, Eduard ClosArshad N Kakrakandy, MD, 80 mg at 08/30/13 1053;  glipiZIDE (GLUCOTROL) tablet 5 mg, 5 mg, Oral, BID AC, Eduard ClosArshad N Kakrakandy, MD, 5 mg at 08/30/13 [id number];  guaiFENesin-dextromethorphan (ROBITUSSIN DM) 100-10 MG/5ML syrup 15 mL, 15 mL, Oral, Q4H PRN, Lars MageEmma M Collins, PA-C hydrALAZINE (APRESOLINE) injection 10 mg, 10 mg, Intravenous, Q2H PRN, Lars MageEmma M Collins, PA-C;  HYDROcodone-acetaminophen (NORCO/VICODIN) 5-325 MG per tablet 1 tablet, 1 tablet, Oral, Q4H PRN, Eduard ClosArshad N Kakrakandy, MD, 1 tablet at 08/29/13 1401;  insulin aspart (novoLOG) injection 0-9 Units, 0-9 Units, Subcutaneous, TID WC, Eduard ClosArshad N Kakrakandy, MD, 2 Units at 08/28/13 1655 insulin glargine (LANTUS) injection 5 Units, 5 Units, Subcutaneous, Daily, Eduard ClosArshad N Kakrakandy, MD, 5 Units at 08/28/13 0955;  isosorbide mononitrate (IMDUR) 24 hr tablet 30 mg, 30 mg, Oral, q morning - 10a, Eduard ClosArshad N Kakrakandy, MD, 30 mg at 08/30/13 1053;  labetalol (NORMODYNE,TRANDATE) injection 10 mg, 10 mg, Intravenous, Q2H PRN, Lars MageEmma M Collins,  PA-C latanoprost (XALATAN) 0.005 % ophthalmic solution 1 drop, 1 drop, Both Eyes, QHS, Eduard ClosArshad N Kakrakandy, MD, 1 drop at 08/29/13 2217;  magnesium sulfate IVPB 2 g 50 mL, 2 g, Intravenous, Daily PRN, Lars MageEmma M Collins, PA-C;  metoprolol (LOPRESSOR) injection 2-5 mg, 2-5 mg, Intravenous, Q2H PRN, Lars MageEmma M Collins, PA-C;  morphine 2 MG/ML injection 1 mg, 1 mg, Intravenous, Q4H PRN, Eduard ClosArshad N Kakrakandy,  MD, 1 mg at 08/29/13 2218 nitroGLYCERIN (NITROSTAT) SL tablet 0.4 mg, 0.4 mg, Sublingual, Q5 min PRN, Eduard Clos, MD;  ondansetron [facility]) injection 4 mg, 4 mg, Intravenous, Q6H PRN, Eduard Clos, MD;  ondansetron (ZOFRAN) tablet 4 mg, 4 mg, Oral, Q6H PRN, Eduard Clos, MD;  pantoprazole (PROTONIX) EC tablet 40 mg, 40 mg, Oral, Daily, Eduard Clos, MD, 40 mg at 08/30/13 1052 phenol (CHLORASEPTIC) mouth spray 1 spray, 1 spray, Mouth/Throat, PRN, Lars Mage, PA-C;  potassium chloride SA (K-DUR,KLOR-CON) CR tablet 20-40 mEq, 20-40 mEq, Oral, Daily PRN, Lars Mage, PA-C;  potassium chloride SA (K-DUR,KLOR-CON) CR tablet 40 mEq, 40 mEq, Oral, BID, Eduard Clos, MD, 40 mEq at 08/30/13 1052;  simvastatin (ZOCOR) tablet 20 mg, 20 mg, Oral, QPM, Eduard Clos, MD, 20 mg at 08/29/13 1750 sodium chloride 0.9 % injection 3 mL, 3 mL, Intravenous, Q12H, Eduard Clos, MD, 3 mL at 08/30/13 1055;  tamsulosin (FLOMAX) capsule 0.8 mg, 0.8 mg, Oral, QHS, Eduard Clos, MD, 0.8 mg at 08/29/13 2236;  timolol (TIMOPTIC) 0.5 % ophthalmic solution 1 drop, 1 drop, Both Eyes, QHS, Marden Noble, MD, 1 drop at 08/29/13 2217;  zolpidem (AMBIEN) tablet 5 mg, 5 mg, Oral, QHS, Eduard Clos, MD, 5 mg at 08/29/13 2216  Patients Current Diet: Carb Control  Precautions / Restrictions Precautions Precautions: Fall Precaution Comments: Minimally ambulatory pta. Restrictions Weight Bearing Restrictions: No   Prior Activity Level Limited Community (1-2x/wk): wife does the  driving, pt got out 2 x/week. Pt reports he mainly used his wheelchair at home but would walk limited distances in the apartment.    Home Assistive Devices / Equipment Home Assistive Devices/Equipment: Dan Humphreys (specify type) Home Equipment: Walker - 2 wheels;Wheelchair - manual;Shower seat  Prior Functional Level Prior Function Level of Independence: Independent with assistive device(s);Needs assistance Gait / Transfers Assistance Needed: Ambulates short distances with RW, depending mostly on LLE.  Uses w/c ADL's / Homemaking Assistance Needed: Wife assists patient with bathing and dressing, and meal prep  Current Functional Level Cognition  Overall Cognitive Status: Impaired/Different from baseline Current Attention Level: Selective Orientation Level: Oriented X4 Safety/Judgement: Decreased awareness of safety;Decreased awareness of deficits    Extremity Assessment (includes Sensation/Coordination)          ADLs  Eating/Feeding: Simulated;Modified independent Where Assessed - Eating/Feeding: Bed level Grooming: Performed;Wash/dry hands;Wash/dry face Where Assessed - Grooming: Supine, head of bed up Upper Body Bathing: Simulated;Supervision/safety;Set up Where Assessed - Upper Body Bathing: Supine, head of bed up Lower Body Bathing: Simulated;Maximal assistance Where Assessed - Lower Body Bathing: Rolling right and/or left;Supine, head of bed up Upper Body Dressing: Performed;Minimal assistance Where Assessed - Upper Body Dressing: Supine, head of bed up Lower Body Dressing: Simulated;+1 Total assistance Where Assessed - Lower Body Dressing: Supine, head of bed up;Supine, head of bed flat;Rolling right and/or left ADL Comments:  (Pt. family was assiting more than necassary occording to pt.)    Mobility  Overal bed mobility: Needs Assistance Bed Mobility: Rolling Rolling: Mod assist Sidelying to sit: +2 for physical assistance;Max assist Sit to supine: +2 for physical  assistance;Total assist General bed mobility comments: increased pain with mobility    Transfers  sat EOB for 6 minutes   Ambulation / Gait / Stairs / Wheelchair Mobility  Not assessed yet   Posture / Balance Dynamic Sitting Balance Sitting balance - Comments: Patient sat EOB x 6 minutes.  Pain increasing in RLE.  Attempted to scoot  in bed using UE's - unable.    Special needs/care consideration BiPAP/CPAP - no CPM no Continuous Drip IV no Dialysis no         Life Vest no Oxygen - wears 2L O2 at night Special Bed no Trach Size no Wound Vac (area) - no Skin - pt states he has a sacral pressure sore and has had this sore for "eight years. It just won't heal up." States he has a Pension scheme manager (gel) wheelchair cushion as well.                              Bowel mgmt:  Last BM on 08-25-13   Bladder mgmt: currently using foley catheter Diabetic mgmt - yes     Previous Home Environment Living Arrangements: Spouse/significant other Available Help at Discharge: Family;Available 24 hours/day Type of Home: House Home Layout: One level Home Access: Stairs to enter Entrance Stairs-Rails: None Entrance Stairs-Number of Steps: 2 Home Care Services: No  Discharge Living Setting Plans for Discharge Living Setting: Patient's home;Apartment Type of Home at Discharge: Apartment Discharge Home Layout: One level Discharge Home Access: Stairs to enter (one small step but level entry from the breezeway) Does the patient have any problems obtaining your medications?: No  Social/Family/Support Systems Patient Roles: Spouse Contact Information: Lavon Bothwell, wife Anticipated Caregiver: Wife and two sons may come by to help as needed Anticipated Caregiver's Contact Information: Wife's cell: [phone] Ability/Limitations of Caregiver: wife is unable to provide heavy physical assistance as she uses a walker, wife can provided 24 hr superv., 2 adult sons are supportive as well   Caregiver  Availability: 24/7 ("somebody can always be there" (wife or sons)) Discharge Plan Discussed with Primary Caregiver: Yes Is Caregiver In Agreement with Plan?: Yes Does Caregiver/Family have Issues with Lodging/Transportation while Pt is in Rehab?: No  Goals/Additional Needs Patient/Family Goal for Rehab: Mod I w/ PT, mod I to sup. w/ OT, NA for SLP Expected length of stay: 10-13 days Cultural Considerations: none Dietary Needs: carb modified Equipment Needs: to be determined Special Service Needs: wife sometimes stays with pt at night. Pt states, "I am motivated to work hard but they better not push me like before when I had that heart attack." Pt/Family Agrees to Admission and willing to participate: Yes Program Orientation Provided & Reviewed with Pt/Caregiver Including Roles  & Responsibilities: Yes   Decrease burden of Care through IP rehab admission: NA   Possible need for SNF placement upon discharge: not anticipated   Patient Condition: This patient's condition remains as documented in the consult dated 08/30/13, in which the Rehabilitation Physician determined and documented that the patient's condition is appropriate for intensive rehabilitative care in an inpatient rehabilitation facility. Will admit to inpatient rehab today.  Preadmission Screen Completed By:  Juliann Mule, PT 08/30/2013 12:44 PM ______________________________________________________________________   Discussed status with Dr. Riley Kill on 08/30/13 at 1:01 pm and received telephone approval for admission today.  Admission Coordinator:  Juliann Mule, PT time 1:01 pm/Date 08/30/13

## 2013-08-30 NOTE — Interval H&P Note (Signed)
Nicholas [name] was admitted today to Inpatient Rehabilitation with the diagnosis of left AKA.  The patient's history has been reviewed, patient examined, and there is no change in status.  Patient continues to be appropriate for intensive inpatient rehabilitation.  I have reviewed the patient's chart and labs.  Questions were answered to the patient's satisfaction.  Nicholas [name] 08/30/2013, 4:58 PM

## 2013-08-30 NOTE — Progress Notes (Signed)
Rehab Admissions - We spoke with pt and his wife and they are interested in pursuing inpatient rehab. Questions answered and provided informational brochures. Spoke directly with Dr. Josephine [facility]ablesobert[facility] (attending) and then with Dr. Nehemiah SettlePolite (who is covering for Dr. Kevan NyGates). MD to provide DC orders shortly.  Communicated intended plan to Toni Amendourtney, case manager on unit and will plan on admitting pt to inpatient rehab later today. Updated pt's Rn as well.  Please call for any questions. Thanks,  Juliann MuleJanine Cordarious Zeek, PT Rehabilitation Admissions Coordinator [phone]

## 2013-08-30 NOTE — Discharge Summary (Signed)
Physician Discharge Summary  Patient ID: Nicholas [name] MRN: [id number] DOB/AGE: 11-12-1940 73 y.o.  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 The emergency room patient was evaluated admission was deemed necessary for further evaluation and treatment. Patient was started on broad-spectrum antibiotics. Imaging was negative for osteomyelitis. Patient was seen in consultation by vascular surgery. Amputation was recommended. Cardiac clearance was provided, DO NOT RESUSCITATE status was obtained. Patient underwent left AKA without complication. Currently he is 3 days postop. Patient is felt to be a good candidate for inpatient rehabilitation. He will continue wound care as outlined by vascular surgeon. Currently his staples are to remain in place for 3 weeks. Currently patient without fever chills or abdominal pain and is felt to be stable for discharge to inpatient rehabilitation. Of note the patient does have reportedly a chronic sacral decubitus. Consults: Treatment Team:  Pryor Ochoa, MD Othella Boyer, MD  Significant Diagnostic Studies:Dg Chest 2 View  08/26/2013   CLINICAL DATA:  Wound infection of the left foot. Pre operative respiratory exam.  EXAM: CHEST   2 VIEW  COMPARISON:  05/31/2013 and 12/11/2011  FINDINGS: The heart is at the upper limits of normal in size. There are no acute infiltrates or effusions. No acute osseous abnormality. Prior CABG. No significant change since the prior chest x-ray of 12/11/2011  IMPRESSION: No active cardiopulmonary disease.   Electronically Signed   By: Geanie Cooley M.D.   On: 08/26/2013 15:39   Dg Foot Complete Left  08/24/2013   CLINICAL DATA:  Open wound, infection  EXAM: LEFT FOOT - COMPLETE 3+ VIEW  COMPARISON:  08/28/2011  FINDINGS: Marked soft tissue swelling. Bones are osteopenic. Degenerative changes noted most pronounced of the left first MTP joint. No gross malalignment. Soft tissue ulceration noted of the calcaneal region. No definite displaced fracture or osseous destruction by plain radiography. Soft tissue calcifications present.  IMPRESSION: Soft tissue swelling.  Soft tissue ulceration of the heel region  Diffuse osteopenia and degenerative changes  No definite plain radiographic evidence of bone loss or osteomyelitis. No fracture.   Electronically Signed   By: Ruel Favors M.D.   On: 08/24/2013 18:46      Discharge Exam: Blood pressure 140/68, pulse 85, temperature 98.1 F (36.7 C), temperature source Oral, resp. rate 20, height 5\' 10"  (1.778 m), weight 112.8 kg (248 lb 10.9 oz), SpO2 95.00%. General appearance: alert and cooperative Resp: clear to auscultation bilaterally Cardio: regular rate and rhythm GI: soft, non-tender; bowel sounds normal; no masses,  no organomegaly Extremities: left stump wrapped  Disposition: inpatient rehabilitation   Future Appointments Provider Department Dept Phone   12/02/2013 2:30 PM [facility] Jason Nest [phone]   12/02/2013 3:20 PM Carma Lair Nickel, NP [facility] [phone]       Medication List  allopurinol 300 MG tablet  Commonly known as:  ZYLOPRIM  Take 300 mg by mouth every  morning.     ALPRAZolam 1 MG tablet  Commonly known as:  XANAX  Take 0.5-2 mg by mouth 3 (three) times daily. Takes half tablet in the morning and at 430 pm and 2 tablets at bedtime.     aspirin 81 MG EC tablet  Take 1 tablet (81 mg total) by mouth daily.     atenolol 50 MG tablet  Commonly known as:  TENORMIN  Take 50 mg by mouth 2 (two) times daily.     brimonidine-timolol 0.2-0.5 % ophthalmic solution  Commonly known as:  COMBIGAN  Place 1 drop into both eyes at bedtime.     clopidogrel 75 MG tablet  Commonly known as:  PLAVIX  Take 75 mg by mouth every morning.     diclofenac sodium 1 % Gel  Commonly known as:  VOLTAREN  Apply 1 application topically 4 (four) times daily as needed. Applies to both knees as needed for pain     furosemide 80 MG tablet  Commonly known as:  LASIX  Take 1 tablet (80 mg total) by mouth every morning.     glipiZIDE 5 MG tablet  Commonly known as:  GLUCOTROL  Take 1 tablet (5 mg total) by mouth 2 (two) times daily before a meal.     HYDROcodone-acetaminophen 5-325 MG per tablet  Commonly known as:  NORCO/VICODIN  Take 1 tablet by mouth every 4 (four) hours as needed.     insulin glargine 100 UNIT/ML injection  Commonly known as:  LANTUS  Inject 0.05 mLs (5 Units total) into the skin daily.     isosorbide mononitrate 30 MG 24 hr tablet  Commonly known as:  IMDUR  Take 30 mg by mouth every morning.     latanoprost 0.005 % ophthalmic solution  Commonly known as:  XALATAN  Place 1 drop into both eyes at bedtime.     nitroGLYCERIN 0.4 MG SL tablet  Commonly known as:  NITROSTAT  Place 0.4 mg under the tongue every 5 (five) minutes as needed. For chest pain     omeprazole 20 MG capsule  Commonly known as:  PRILOSEC  Take 20 mg by mouth 2 (two) times daily.     potassium chloride SA 20 MEQ tablet  Commonly known as:  K-DUR,KLOR-CON  Take 2 tablets (40 mEq total) by mouth 2 (two) times daily.     simvastatin 20 MG tablet  Commonly  known as:  ZOCOR  Take 20 mg by mouth every evening.     tamsulosin 0.4 MG Caps capsule  Commonly known as:  FLOMAX  Take 0.8 mg by mouth at bedtime.     temazepam 30 MG capsule  Commonly known as:  RESTORIL  Take 30 mg by mouth at bedtime.           Follow-up Information   Follow up with GATES,ROBERT NEVILL, MD In 4 weeks.   Specialty:  Internal Medicine   Contact information:   [address] [place] [address] [phone]       Signed: Katy ApoOLITE,Sherlyn Ebbert D 08/30/2013, 12:25 PM

## 2013-08-30 NOTE — H&P (Signed)
Physical Medicine and Rehabilitation Admission H&P    Chief Complaint  Patient presents with  . Wound Infection  :  Chief complaint: Leg pain  HPI: Nicholas [name] is a 73 y.o. right-handed male with history of diabetes mellitus or peripheral neuropathy, coronary artery disease and CABG, systolic congestive heart failure, peripheral vascular disease revascularization procedures. Patient used a walker prior to admission. Admitted 08/24/2013 with nonhealing left foot ulcer. Left foot films shows soft tissue ulceration of the heel region no osteomyelitis. No change with conservative care and limb was not felt to be salvageable. 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 Kellie Simmering. Postoperative pain control. Acute blood loss anemia 9.3 and monitored. MRSA PCR screening positive with contact precautions. Physical and occupational therapy evaluations completed with recommendations for physical medicine rehabilitation consult. Patient was admitted for comprehensive rehabilitation program   ROS Review of Systems  Cardiovascular: Positive for leg swelling.  Musculoskeletal: Positive for joint pain and myalgias.  Neurological: Positive for seizures.  Psychiatric/Behavioral: Positive for depression. The patient has insomnia.  Anxiety  All other systems reviewed and are negative  Past Medical History  Diagnosis Date  . Hypertension   . Peripheral vascular disease   . Ulcer     left foot  . Dyslipidemia   . Chronic venous insufficiency   . Sleep apnea   . Morbid obesity   . Diverticulosis   . Benign prostatic hypertrophy   . Glaucoma   . Gout   . Depression   . Insomnia   . CAD (coronary artery disease), native coronary artery   . GERD (gastroesophageal reflux disease)   . Angina   . Pneumonia 12/11/11  . Type II diabetes mellitus   . Chronic daily headache     "~ all the time"  . Seizures   . Stroke ~ 1983    "little weaker right hand"  . MRSA (methicillin  resistant staph aureus) culture positive Dec. 17, 2013     right heel infection   Past Surgical History  Procedure Laterality Date  . Appendectomy    . Lumbar laminectomy    . Mastoid debridement    . Knee and ankle surgery  1979    "caved in in a ditch; messed up legs"; right  . Cholecystectomy    . Back surgery      "5 or 6"  . Peripheral arterial stent graft      "don't remember which side"  . Coronary artery bypass graft  1983; 1992    "CABG X 5; CABG X 8"  . Lower extremity angiogram  01/21/2012    Procedure: LOWER EXTREMITY ANGIOGRAM;  Surgeon: Serafina Mitchell, MD;  Location: [facility];  Service: Vascular;  Laterality: Bilateral;  WITH POSSIBLE INTERVENTION  . Eye surgery      "laser; both eyes"  . Eye surgery  05/08/2012    Cataract Left eye  . Heel bx  Dec. 17, 2013    Right Heel Bx.    . Transluminal atherectomy popliteal artery  01/21/2012    Left   Family History  Problem Relation Age of Onset  . Hypertension Other   . Cancer Other   . Cancer Sister   . Deep vein thrombosis Brother   . Heart disease Brother   . Deep vein thrombosis Brother   . Heart disease Brother   . Cancer Sister    Social History:  reports that he quit smoking about 22 years ago. His smoking use included Cigarettes. He  has a 20 pack-year smoking history. He quit smokeless tobacco use about 26 years ago. His smokeless tobacco use included Chew. He reports that he does not drink alcohol or use illicit drugs. Allergies:  Allergies  Allergen Reactions  . Contrast Media [Iodinated Diagnostic Agents] Other (See Comments)    Shaking and feeling terrible   Medications Prior to Admission  Medication Sig Dispense Refill  . allopurinol (ZYLOPRIM) 300 MG tablet Take 300 mg by mouth every morning.       Marland Kitchen ALPRAZolam (XANAX) 1 MG tablet Take 0.5-2 mg by mouth 3 (three) times daily. Takes half tablet in the morning and at 430 pm and 2 tablets at bedtime.      Marland Kitchen aspirin EC 81 MG EC tablet Take 1 tablet (81  mg total) by mouth daily.  100 tablet  3  . atenolol (TENORMIN) 50 MG tablet Take 50 mg by mouth 2 (two) times daily.       . brimonidine-timolol (COMBIGAN) 0.2-0.5 % ophthalmic solution Place 1 drop into both eyes at bedtime.       . clopidogrel (PLAVIX) 75 MG tablet Take 75 mg by mouth every morning.       . diclofenac sodium (VOLTAREN) 1 % GEL Apply 1 application topically 4 (four) times daily as needed. Applies to both knees as needed for pain      . furosemide (LASIX) 80 MG tablet Take 1 tablet (80 mg total) by mouth every morning.  90 tablet  3  . glipiZIDE (GLUCOTROL) 5 MG tablet Take 1 tablet (5 mg total) by mouth 2 (two) times daily before a meal.  60 tablet  11  . HYDROcodone-acetaminophen (NORCO/VICODIN) 5-325 MG per tablet Take 1 tablet by mouth every 4 (four) hours as needed.  120 tablet  0  . insulin glargine (LANTUS) 100 UNIT/ML injection Inject 10-40 Units into the skin 2 (two) times daily as needed. Per sliding scale in divided doses. May use up to 140 units/day. Usual dose is 5-10 units      . isosorbide mononitrate (IMDUR) 30 MG 24 hr tablet Take 30 mg by mouth every morning.       . latanoprost (XALATAN) 0.005 % ophthalmic solution Place 1 drop into both eyes at bedtime.        . nitroGLYCERIN (NITROSTAT) 0.4 MG SL tablet Place 0.4 mg under the tongue every 5 (five) minutes as needed. For chest pain      . omeprazole (PRILOSEC) 20 MG capsule Take 20 mg by mouth 2 (two) times daily.       . potassium chloride SA (K-DUR,KLOR-CON) 20 MEQ tablet Take 2 tablets (40 mEq total) by mouth 2 (two) times daily.  120 tablet  5  . simvastatin (ZOCOR) 20 MG tablet Take 20 mg by mouth every evening.       . tamsulosin (FLOMAX) 0.4 MG CAPS capsule Take 0.8 mg by mouth at bedtime.       . temazepam (RESTORIL) 30 MG capsule Take 30 mg by mouth at bedtime.         Home: Home Living Family/patient expects to be discharged to:: Inpatient rehab Living Arrangements: Spouse/significant  other Available Help at Discharge: Family;Available 24 hours/day Type of Home: House Home Access: Stairs to enter [employer] of Steps: 2 Entrance Stairs-Rails: None Home Layout: One level Home Equipment: Walker - 2 wheels;Wheelchair - Teacher, adult education History:    Functional Status:  Mobility:  ADL: ADL Eating/Feeding: Simulated;Modified independent Where Assessed - Eating/Feeding: Bed level Grooming: Performed;Wash/dry hands;Wash/dry face Where Assessed - Grooming: Supine, head of bed up Upper Body Bathing: Simulated;Supervision/safety;Set up Where Assessed - Upper Body Bathing: Supine, head of bed up Lower Body Bathing: Simulated;Maximal assistance Where Assessed - Lower Body Bathing: Rolling right and/or left;Supine, head of bed up Upper Body Dressing: Performed;Minimal assistance Where Assessed - Upper Body Dressing: Supine, head of bed up Lower Body Dressing: Simulated;+1 Total assistance Where Assessed - Lower Body Dressing: Supine, head of bed up;Supine, head of bed flat;Rolling right and/or left ADL Comments:  (Pt. family was assiting more than necassary occording to pt.)  Cognition: Cognition Overall Cognitive Status: Within Functional Limits for tasks assessed Orientation Level: Oriented X4 Cognition Arousal/Alertness: Awake/alert Behavior During Therapy: WFL for tasks assessed/performed Overall Cognitive Status: Within Functional Limits for tasks assessed  Physical Exam: Blood pressure 129/54, pulse 57, temperature 97.9 F (36.6 C), temperature source Oral, resp. rate 20, height _0  (1.778 m), weight 112 kg (246 lb 14.6 oz), SpO2 95.00%. Physical Exam Constitutional: He appears well-developed. No distress.  73 year old obese male  HENT:  Head: Normocephalic and atraumatic.  Right Ear: External ear normal.  Left Ear: External ear normal.  Eyes: Conjunctivae and EOM are normal. Pupils are equal, round, and reactive to  light.  Neck: Normal range of motion. Neck supple. No JVD present. No tracheal deviation present. No thyromegaly present.  Cardiovascular: Normal rate, regular rhythm and normal heart sounds. Exam reveals no gallop and no friction rub.  No murmur heard.  Respiratory: Effort normal and breath sounds normal. No respiratory distress. He has no wheezes. He has no rales. He exhibits no tenderness.  GI: Soft. Bowel sounds are normal. He exhibits no distension. There is no tenderness. There is no rebound.  Musculoskeletal:  Left leg edematous. Right foot with pes planus, degenerative changes.  Lymphadenopathy:  He has no cervical adenopathy.  Neurological:  Patient is appropriate. He does answer basic questions. Wife is at bedside. Decreased PP and LT below the knee on the right leg. Sensory lost in fingers and hans as well.  Amputation site with staples intact without drainage and appropriately tender  Psychiatric: He has a normal mood and affect. His behavior is normal. Thought content normal  Results for orders placed during the hospital encounter of 08/24/13 (from the past 48 hour(s))  GLUCOSE, CAPILLARY     Status: Abnormal   Collection Time    08/28/13 11:52 AM      Result Value Range   Glucose-Capillary 133 (*) 70 - 99 mg/dL  GLUCOSE, CAPILLARY     Status: Abnormal   Collection Time    08/28/13  4:40 PM      Result Value Range   Glucose-Capillary 175 (*) 70 - 99 mg/dL  GLUCOSE, CAPILLARY     Status: Abnormal   Collection Time    08/28/13  9:07 PM      Result Value Range   Glucose-Capillary 132 (*) 70 - 99 mg/dL   Comment 1 Documented in Chart     Comment 2 Notify RN    BASIC METABOLIC PANEL     Status: Abnormal   Collection Time    08/29/13  4:37 AM      Result Value Range   Sodium 140  137 - 147 mEq/L   Potassium 4.4  3.7 - 5.3 mEq/L   Chloride 99  96 - 112 mEq/L   CO2 29  19 - 32 mEq/L  Glucose, Bld 76  70 - 99 mg/dL   BUN 10  6 - 23 mg/dL   Creatinine, Ser 1.18  0.50 -  1.35 mg/dL   Calcium 8.6  8.4 - 10.5 mg/dL   GFR calc non Af Amer 60 (*) >90 mL/min   GFR calc Af Amer 70 (*) >90 mL/min   Comment: (NOTE)     The eGFR has been calculated using the CKD EPI equation.     This calculation has not been validated in all clinical situations.     eGFR's persistently <90 mL/min signify possible Chronic Kidney     Disease.  CBC     Status: Abnormal   Collection Time    08/29/13  4:37 AM      Result Value Range   WBC 8.7  4.0 - 10.5 K/uL   RBC 3.63 (*) 4.22 - 5.81 MIL/uL   Hemoglobin 9.3 (*) 13.0 - 17.0 g/dL   HCT 30.6 (*) 39.0 - 52.0 %   MCV 84.3  78.0 - 100.0 fL   MCH 25.6 (*) 26.0 - 34.0 pg   MCHC 30.4  30.0 - 36.0 g/dL   RDW 15.4  11.5 - 15.5 %   Platelets 286  150 - 400 K/uL  GLUCOSE, CAPILLARY     Status: Abnormal   Collection Time    08/29/13  6:42 AM      Result Value Range   Glucose-Capillary 64 (*) 70 - 99 mg/dL   Comment 1 Documented in Chart     Comment 2 Notify RN    GLUCOSE, CAPILLARY     Status: None   Collection Time    08/29/13  7:14 AM      Result Value Range   Glucose-Capillary 87  70 - 99 mg/dL   Comment 1 Documented in Chart     Comment 2 Notify RN    GLUCOSE, CAPILLARY     Status: Abnormal   Collection Time    08/29/13 11:34 AM      Result Value Range   Glucose-Capillary 108 (*) 70 - 99 mg/dL  GLUCOSE, CAPILLARY     Status: Abnormal   Collection Time    08/29/13  4:41 PM      Result Value Range   Glucose-Capillary 131 (*) 70 - 99 mg/dL  GLUCOSE, CAPILLARY     Status: Abnormal   Collection Time    08/29/13 10:07 PM      Result Value Range   Glucose-Capillary 107 (*) 70 - 99 mg/dL  GLUCOSE, CAPILLARY     Status: Abnormal   Collection Time    08/30/13  6:58 AM      Result Value Range   Glucose-Capillary 101 (*) 70 - 99 mg/dL   No results found.  Post Admission Physician Evaluation: 1. Functional deficits secondary  to left AKA. 2. Patient is admitted to receive collaborative, interdisciplinary care between the  physiatrist, rehab nursing staff, and therapy team. 3. Patient's level of medical complexity and substantial therapy needs in context of that medical necessity cannot be provided at a lesser intensity of care such as a SNF. 4. Patient has experienced substantial functional loss from his/her baseline which was documented above under the "Functional History" and "Functional Status" headings.  Judging by the patient's diagnosis, physical exam, and functional history, the patient has potential for functional progress which will result in measurable gains while on inpatient rehab.  These gains will be of substantial and practical use upon discharge  in facilitating  mobility and self-care at the household level. 5. Physiatrist will provide 24 hour management of medical needs as well as oversight of the therapy plan/treatment and provide guidance as appropriate regarding the interaction of the two. 6. 24 hour rehab nursing will assist with bladder management, bowel management, safety, skin/wound care, disease management, medication administration, pain management and patient education  and help integrate therapy concepts, techniques,education, etc. 7. PT will assess and treat for/with: Lower extremity strength, range of motion, stamina, balance, functional mobility, safety, adaptive techniques and equipment, pre-prosthetic education, pain mgt, wound care.   Goals are: mod I. 8. OT will assess and treat for/with: ADL's, functional mobility, safety, upper extremity strength, adaptive techniques and equipment, pre-prosthetic education, pain mgt.   Goals are: mod I. 9. SLP will assess and treat for/with: n/a .  Goals are: n/a. 10. Case Management and Social Worker will assess and treat for psychological issues and discharge planning. 11. Team conference will be held weekly to assess progress toward goals and to determine barriers to discharge. 12. Patient will receive at least 3 hours of therapy per day at least 5  days per week. 13. ELOS: 10-12 days       14. Prognosis:  excellent   Medical Problem List and Plan: 1. 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 DVT Prophylaxis/Anticoagulation: Subcutaneous Lovenox. Monitor platelet counts any signs of bleeding 3. Pain Management: Hydrocodone as needed. Monitor with increased mobility 4. Mood/anxiety. Xanax 0.5 mg twice a day and 2 mg each bedtime. Provide emotional support 5. Neuropsych: This patient is capable of making decisions on his own behalf. 6. Acute blood loss anemia. Latest hemoglobin 9.3. Followup CBC 7. Coronary artery disease with CABG. Continue aspirin and Plavix therapy. No chest pain or shortness of breath. 8. Diabetes mellitus with peripheral neuropathy. Hemoglobin A1c 7.4. Glucotrol 5 mg twice a day, Lantus insulin 5 units daily. Check blood sugars a.c. and at bedtime. 9. Hypertension. Tenormin 50 mg twice a day, Imdur 30 mg daily. Monitor with increased mobility 10. Systolic congestive heart failure. Continue Lasix. Monitor for any signs of fluid overload. 11. Hyperlipidemia. Zocor. 12. BPH. Flomax 0.8 mg each bedtime. Check PVRs x3 13. Gout. Zyloprim 300 mg daily. Monitor for any signs gout flare up  Meredith Staggers, MD, [facility]   08/30/2013

## 2013-08-30 NOTE — Consult Note (Signed)
Physical Medicine and Rehabilitation Consult Reason for Consult: Left AKA Referring Physician: Dr. Kevan Ny   HPI: Nicholas [name] is a 73 y.o. right-handed male with history of diabetes mellitus or peripheral neuropathy, coronary artery disease and CABG, systolic congestive heart failure, peripheral vascular disease revascularization procedures. Patient used a walker prior to admission. Admitted 08/24/2013 with nonhealing left foot ulcer. Left foot films shows soft tissue ulceration of the heel region no osteomyelitis. No change with conservative care and limb was not felt to be salvageable. 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 Hart [place]. Postoperative pain control. Acute blood loss anemia 9.3 and monitored. MRSA PCR screening positive with contact precautions. Physical therapy evaluation completed 08/28/2013 with recommendations for physical medicine rehabilitation consult.   Review of Systems  Cardiovascular: Positive for leg swelling.  Musculoskeletal: Positive for joint pain and myalgias.  Neurological: Positive for seizures.  Psychiatric/Behavioral: Positive for depression. The patient has insomnia.        Anxiety  All other systems reviewed and are negative.   Past Medical History  Diagnosis Date  . Hypertension   . Peripheral vascular disease   . Ulcer     left foot  . Dyslipidemia   . Chronic venous insufficiency   . Sleep apnea   . Morbid obesity   . Diverticulosis   . Benign prostatic hypertrophy   . Glaucoma   . Gout   . Depression   . Insomnia   . CAD (coronary artery disease), native coronary artery   . GERD (gastroesophageal reflux disease)   . Angina   . Pneumonia 12/11/11  . Type II diabetes mellitus   . Chronic daily headache     "~ all the time"  . Seizures   . Stroke ~ 1983    "little weaker right hand"  . MRSA (methicillin resistant staph aureus) culture positive Dec. 17, 2013     right heel infection   Past Surgical History   Procedure Laterality Date  . Appendectomy    . Lumbar laminectomy    . Mastoid debridement    . Knee and ankle surgery  1979    "caved in in a ditch; messed up legs"; right  . Cholecystectomy    . Back surgery      "5 or 6"  . Peripheral arterial stent graft      "don't remember which side"  . Coronary artery bypass graft  1983; 1992    "CABG X 5; CABG X 8"  . Lower extremity angiogram  01/21/2012    Procedure: LOWER EXTREMITY ANGIOGRAM;  Surgeon: Nada Libman, MD;  Location: [facility] OR;  Service: Vascular;  Laterality: Bilateral;  WITH POSSIBLE INTERVENTION  . Eye surgery      "laser; both eyes"  . Eye surgery  05/08/2012    Cataract Left eye  . Heel bx  Dec. 17, 2013    Right Heel Bx.    . Transluminal atherectomy popliteal artery  01/21/2012    Left   Family History  Problem Relation Age of Onset  . Hypertension Other   . Cancer Other   . Cancer Sister   . Deep vein thrombosis Brother   . Heart disease Brother   . Deep vein thrombosis Brother   . Heart disease Brother   . Cancer Sister    Social History:  reports that he quit smoking about 22 years ago. His smoking use included Cigarettes. He has a 20 pack-year smoking history. He quit smokeless tobacco use about 26  years ago. His smokeless tobacco use included Chew. He reports that he does not drink alcohol or use illicit drugs. Allergies:  Allergies  Allergen Reactions  . Contrast Media [Iodinated Diagnostic Agents] Other (See Comments)    Shaking and feeling terrible   Medications Prior to Admission  Medication Sig Dispense Refill  . allopurinol (ZYLOPRIM) 300 MG tablet Take 300 mg by mouth every morning.       Marland Kitchen ALPRAZolam (XANAX) 1 MG tablet Take 0.5-2 mg by mouth 3 (three) times daily. Takes half tablet in the morning and at 430 pm and 2 tablets at bedtime.      Marland Kitchen aspirin EC 81 MG EC tablet Take 1 tablet (81 mg total) by mouth daily.  100 tablet  3  . atenolol (TENORMIN) 50 MG tablet Take 50 mg by mouth 2 (two)  times daily.       . brimonidine-timolol (COMBIGAN) 0.2-0.5 % ophthalmic solution Place 1 drop into both eyes at bedtime.       . clopidogrel (PLAVIX) 75 MG tablet Take 75 mg by mouth every morning.       . diclofenac sodium (VOLTAREN) 1 % GEL Apply 1 application topically 4 (four) times daily as needed. Applies to both knees as needed for pain      . furosemide (LASIX) 80 MG tablet Take 1 tablet (80 mg total) by mouth every morning.  90 tablet  3  . glipiZIDE (GLUCOTROL) 5 MG tablet Take 1 tablet (5 mg total) by mouth 2 (two) times daily before a meal.  60 tablet  11  . HYDROcodone-acetaminophen (NORCO/VICODIN) 5-325 MG per tablet Take 1 tablet by mouth every 4 (four) hours as needed.  120 tablet  0  . insulin glargine (LANTUS) 100 UNIT/ML injection Inject 10-40 Units into the skin 2 (two) times daily as needed. Per sliding scale in divided doses. May use up to 140 units/day. Usual dose is 5-10 units      . isosorbide mononitrate (IMDUR) 30 MG 24 hr tablet Take 30 mg by mouth every morning.       . latanoprost (XALATAN) 0.005 % ophthalmic solution Place 1 drop into both eyes at bedtime.        . nitroGLYCERIN (NITROSTAT) 0.4 MG SL tablet Place 0.4 mg under the tongue every 5 (five) minutes as needed. For chest pain      . omeprazole (PRILOSEC) 20 MG capsule Take 20 mg by mouth 2 (two) times daily.       . potassium chloride SA (K-DUR,KLOR-CON) 20 MEQ tablet Take 2 tablets (40 mEq total) by mouth 2 (two) times daily.  120 tablet  5  . simvastatin (ZOCOR) 20 MG tablet Take 20 mg by mouth every evening.       . tamsulosin (FLOMAX) 0.4 MG CAPS capsule Take 0.8 mg by mouth at bedtime.       . temazepam (RESTORIL) 30 MG capsule Take 30 mg by mouth at bedtime.         Home: Home Living Family/patient expects to be discharged to:: Inpatient rehab Living Arrangements: Spouse/significant other (Wife uses RW) Available Help at Discharge: Family;Available 24 hours/day (Wife uses RW - limited ability to  assist) Type of Home: House Home Access: Stairs to enter [employer] of Steps: 2 Entrance Stairs-Rails: None Home Layout: One level Home Equipment: Environmental consultant - 2 wheels;Wheelchair - Horticulturist, commercial)  Functional History:   Functional Status:  Mobility:          ADL:  Cognition: Cognition Overall Cognitive Status: Within Functional Limits for tasks assessed Orientation Level: Oriented X4 Cognition Arousal/Alertness: Awake/alert Behavior During Therapy: WFL for tasks assessed/performed Overall Cognitive Status: Within Functional Limits for tasks assessed  Blood pressure 105/64, pulse 69, temperature 99.2 F (37.3 C), temperature source Oral, resp. rate 18, height 5\' 10"  (1.778 m), weight 116.3 kg (256 lb 6.3 oz), SpO2 90.00%. Physical Exam  Constitutional: He appears well-developed. No distress.  73 year old obese male  HENT:  Head: Normocephalic and atraumatic.  Right Ear: External ear normal.  Left Ear: External ear normal.  Eyes: Conjunctivae and EOM are normal. Pupils are equal, round, and reactive to light.  Neck: Normal range of motion. Neck supple. No JVD present. No tracheal deviation present. No thyromegaly present.  Cardiovascular: Normal rate, regular rhythm and normal heart sounds.  Exam reveals no gallop and no friction rub.   No murmur heard. Respiratory: Effort normal and breath sounds normal. No respiratory distress. He has no wheezes. He has no rales. He exhibits no tenderness.  GI: Soft. Bowel sounds are normal. He exhibits no distension. There is no tenderness. There is no rebound.  Musculoskeletal:  Left leg edematous. Right foot with pes planus, degenerative changes.   Lymphadenopathy:    He has no cervical adenopathy.  Neurological:  Patient is appropriate. He does answer basic questions. Wife is at bedside. Decreased PP and LT below the knee on the right leg. Sensory lost in fingers and hans as well.   Skin: He is not  diaphoretic.  Amputation site is dressed appropriately tender  Psychiatric: He has a normal mood and affect. His behavior is normal. Thought content normal.    Results for orders placed during the hospital encounter of 08/24/13 (from the past 24 hour(s))  GLUCOSE, CAPILLARY     Status: Abnormal   Collection Time    08/29/13  6:42 AM      Result Value Range   Glucose-Capillary 64 (*) 70 - 99 mg/dL   Comment 1 Documented in Chart     Comment 2 Notify RN    GLUCOSE, CAPILLARY     Status: None   Collection Time    08/29/13  7:14 AM      Result Value Range   Glucose-Capillary 87  70 - 99 mg/dL   Comment 1 Documented in Chart     Comment 2 Notify RN    GLUCOSE, CAPILLARY     Status: Abnormal   Collection Time    08/29/13 11:34 AM      Result Value Range   Glucose-Capillary 108 (*) 70 - 99 mg/dL  GLUCOSE, CAPILLARY     Status: Abnormal   Collection Time    08/29/13  4:41 PM      Result Value Range   Glucose-Capillary 131 (*) 70 - 99 mg/dL  GLUCOSE, CAPILLARY     Status: Abnormal   Collection Time    08/29/13 10:07 PM      Result Value Range   Glucose-Capillary 107 (*) 70 - 99 mg/dL   No results found.  Assessment/Plan: Diagnosis: left AKA 1. Does the need for close, 24 hr/day medical supervision in concert with the patient's rehab needs make it unreasonable for this patient to be served in a less intensive setting? Yes 2. Co-Morbidities requiring supervision/potential complications: pad, cad, mrsa, chf, morbid obesity 3. Due to bladder management, bowel management, safety, skin/wound care, disease management, medication administration, pain management and patient education, does the patient require 24 hr/day rehab nursing? Yes  4. Does the patient require coordinated care of a physician, rehab nurse, PT (1-2 hrs/day, 5 days/week) and OT (1-2 hrs/day, 5 days/week) to address physical and functional deficits in the context of the above medical diagnosis(es)? Yes Addressing deficits  in the following areas: balance, endurance, locomotion, strength, transferring, bowel/bladder control, bathing, dressing, feeding, grooming, toileting and psychosocial support 5. Can the patient actively participate in an intensive therapy program of at least 3 hrs of therapy per day at least 5 days per week? Yes 6. The potential for patient to make measurable gains while on inpatient rehab is excellent 7. Anticipated functional outcomes upon discharge from inpatient rehab are mod I with PT, mod I to supervision with OT, n/a with SLP. 8. Estimated rehab length of stay to reach the above functional goals is: 10-13 days 9. Does the patient have adequate social supports to accommodate these discharge functional goals? Yes 10. Anticipated D/C setting: Home 11. Anticipated post D/C treatments: HH therapy 12. Overall Rehab/Functional Prognosis: excellent  RECOMMENDATIONS: This patient's condition is appropriate for continued rehabilitative care in the following setting: CIR Patient has agreed to participate in recommended program. Yes Note that insurance prior authorization may be required for reimbursement for recommended care.  Comment: Rehab Admissions Coordinator to follow up.  Thanks,  Ranelle Oyster, MD, [place] [facility]     08/30/2013

## 2013-08-30 NOTE — Progress Notes (Signed)
Received Pt. As a transfer [facility].Pt. Alert and oriented,from home with wife.Pt. Does not have home equipment at bedside.Pt. 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 Closely and assessing his needs.

## 2013-08-30 NOTE — Progress Notes (Signed)
Physical Therapy Treatment Patient Details Name: Nicholas [name] MRN: [id number] DOB: Jan 02, 1941 Today's Date: 08/30/2013 Time: [id number] PT Time Calculation (min): 16 min  PT Assessment / Plan / Recommendation  History of Present Illness Nicholas [name] is a 73 y.o. male with history of diabetes mellitus2, CAD status post CABG, CHF/ischemic cardiomyopathy last EF was 30%, peripheral vascular disease, history of seizures and chronic venous insufficiency, OSA noncompliant with CPAP was referred to the ER by patient's primary care physician after patient was noticed to have worsening left foot ulcer with gangrene.  Patient now s/p Lt AKA.   PT Comments   Patient educated on techniques for sensory stimulation to residual limb. Patient also educated on AKA there-ex with handout provided. Spoke to patient regarding positioning and methods for preventing further pressure wounds.   Follow Up Recommendations  CIR     Does the patient have the potential to tolerate intense rehabilitation     Barriers to Discharge        Equipment Recommendations  Wheelchair cushion (measurements PT)    Recommendations for Other Services Rehab consult  Frequency Min 3X/week   Progress towards PT Goals    Plan Current plan remains appropriate    Precautions / Restrictions Precautions Precautions: Fall Precaution Comments: Minimally ambulatory pta. Restrictions Weight Bearing Restrictions: No   Pertinent Vitals/Pain No value given but states i have a lot of pain    Mobility  Bed Mobility Overal bed mobility: Needs Assistance Bed Mobility: Rolling Rolling: Mod assist General bed mobility comments: increased pain with mobility    Exercises Amputee Exercises Gluteal Sets: 10 reps Hip Extension: Left;10 reps (X2 performed in side lying, educated on prone as well) Hip Flexion/Marching: Left;10 reps     PT Goals (current goals can now be found in the care plan section) Acute Rehab PT Goals Patient  Stated Goal:  (to go home post rehab) PT Goal Formulation: With patient Time For Goal Achievement: 09/11/13 Potential to Achieve Goals: Good  Visit Information  Last PT Received On: 08/30/13 Assistance Needed: +2 History of Present Illness: Nicholas [name] is a 73 y.o. male with history of diabetes mellitus2, CAD status post CABG, CHF/ischemic cardiomyopathy last EF was 30%, peripheral vascular disease, history of seizures and chronic venous insufficiency, OSA noncompliant with CPAP was referred to the ER by patient's primary care physician after patient was noticed to have worsening left foot ulcer with gangrene.  Patient now s/p Lt AKA.    Subjective Data  Patient Stated Goal:  (to go home post rehab)   Cognition  Cognition Arousal/Alertness: Awake/alert Behavior During Therapy: WFL for tasks assessed/performed Overall Cognitive Status: Impaired/Different from baseline Area of Impairment: Attention;Safety/judgement;Problem solving Current Attention Level: Selective Memory: Decreased recall of precautions;Decreased short-term memory Safety/Judgement: Decreased awareness of safety;Decreased awareness of deficits Problem Solving: Slow processing       End of Session PT - End of Session Activity Tolerance: Patient limited by fatigue;Patient limited by pain Patient left: in bed;with call bell/phone within reach;with bed alarm set;with family/visitor present Nurse Communication: Mobility status;Need for lift equipment   GP     Fabio AsaWerner, Ailanie Ruttan J 08/30/2013, 10:28 AM Charlotte Crumbevon Alayzha An, PT DPT  [phone]

## 2013-08-30 NOTE — Evaluation (Signed)
Occupational Therapy Evaluation Patient Details Name: Nicholas [name] MRN: [id number] DOB: 05/04/1941 Today's Date: 08/30/2013 Time: 4098-1191 OT Time Calculation (min): 54 min  OT Assessment / Plan / Recommendation History of present illness Nicholas [name] is a 73 y.o. male with history of diabetes mellitus2, CAD status post CABG, CHF/ischemic cardiomyopathy last EF was 30%, peripheral vascular disease, history of seizures and chronic venous insufficiency, OSA noncompliant with CPAP was referred to the ER by patient's primary care physician after patient was noticed to have worsening left foot ulcer with gangrene.  Patient now s/p Lt AKA.   Clinical Impression   Pt. Is pleasant man who is in need of further skilled OT services at d/c location. Pt. Was seen by rehab doctor during OT eval and was approved to go to rehab. Pt. Will most likely transfer today or tomorrow and OT can continue there. No further acute OT needed secondary to above.    OT Assessment  Patient needs continued OT Services    Follow Up Recommendations  CIR    Barriers to Discharge Decreased caregiver support    Equipment Recommendations  None recommended by OT    Recommendations for Other Services Rehab consult  Frequency       Precautions / Restrictions Precautions Precautions: Fall Precaution Comments: Minimally ambulatory pta. Restrictions Weight Bearing Restrictions: No   Pertinent Vitals/Pain 1/10    ADL  Eating/Feeding: Simulated;Modified independent Where Assessed - Eating/Feeding: Bed level Grooming: Performed;Wash/dry hands;Wash/dry face Where Assessed - Grooming: Supine, head of bed up Upper Body Bathing: Simulated;Supervision/safety;Set up Where Assessed - Upper Body Bathing: Supine, head of bed up Lower Body Bathing: Simulated;Maximal assistance Where Assessed - Lower Body Bathing: Rolling right and/or left;Supine, head of bed up Upper Body Dressing: Performed;Minimal assistance Where  Assessed - Upper Body Dressing: Supine, head of bed up Lower Body Dressing: Simulated;+1 Total assistance Where Assessed - Lower Body Dressing: Supine, head of bed up;Supine, head of bed flat;Rolling right and/or left ADL Comments:  (Pt. family was assiting more than necassary occording to pt.)    OT Diagnosis: Generalized weakness  OT Problem List: Decreased activity tolerance;Impaired balance (sitting and/or standing);Decreased coordination;Decreased knowledge of use of DME or AE;Impaired sensation;Pain OT Treatment Interventions:     OT Goals(Current goals can be found in the care plan section) Acute Rehab OT Goals Patient Stated Goal:  (to go home post rehab)  Visit Information  Last OT Received On: 08/30/13 Assistance Needed: +2 History of Present Illness: Nicholas [name] is a 73 y.o. male with history of diabetes mellitus2, CAD status post CABG, CHF/ischemic cardiomyopathy last EF was 30%, peripheral vascular disease, history of seizures and chronic venous insufficiency, OSA noncompliant with CPAP was referred to the ER by patient's primary care physician after patient was noticed to have worsening left foot ulcer with gangrene.  Patient now s/p Lt AKA.       Prior Functioning     Home Living Family/patient expects to be discharged to:: Inpatient rehab Living Arrangements: Spouse/significant other Available Help at Discharge: Family;Available 24 hours/day Type of Home: House Home Access: Stairs to enter [employer] of Steps: 2 Entrance Stairs-Rails: None Home Layout: One level Home Equipment: Walker - 2 wheels;Wheelchair - manual;Shower seat Prior Function Level of Independence: Independent with assistive device(s);Needs assistance Gait / Transfers Assistance Needed: Ambulates short distances with RW, depending mostly on LLE.  Uses w/c ADL's / Homemaking Assistance Needed: Wife assists patient with bathing and dressing, and meal  prep Communication Communication: No difficulties  Vision/Perception Vision - History Baseline Vision: Wears glasses only for reading Visual History: Cataracts (B cataract sx) Patient Visual Report: No change from baseline   Cognition  Cognition Arousal/Alertness: Awake/alert Behavior During Therapy: WFL for tasks assessed/performed Overall Cognitive Status: Within Functional Limits for tasks assessed    Extremity/Trunk Assessment Upper Extremity Assessment Upper Extremity Assessment: LUE deficits/detail;RUE deficits/detail RUE Sensation: decreased light touch;history of peripheral neuropathy LUE Sensation: decreased light touch;history of peripheral neuropathy     Mobility       Exercise     Balance     End of Session OT - End of Session Activity Tolerance: Patient tolerated treatment well Patient left: in bed;with call bell/phone within reach;with family/visitor present  GO     Nicholas [name] 08/30/2013, 8:52 AM

## 2013-08-31 ENCOUNTER — Inpatient Hospital Stay (HOSPITAL_COMMUNITY): Payer: Medicare Other | Admitting: Occupational Therapy

## 2013-08-31 ENCOUNTER — Encounter (HOSPITAL_COMMUNITY): Payer: Self-pay | Admitting: Vascular Surgery

## 2013-08-31 ENCOUNTER — Inpatient Hospital Stay (HOSPITAL_COMMUNITY): Payer: Medicare Other

## 2013-08-31 DIAGNOSIS — E1149 Type 2 diabetes mellitus with other diabetic neurological complication: Secondary | ICD-10-CM

## 2013-08-31 DIAGNOSIS — S88119A Complete traumatic amputation at level between knee and ankle, unspecified lower leg, initial encounter: Secondary | ICD-10-CM

## 2013-08-31 DIAGNOSIS — E1142 Type 2 diabetes mellitus with diabetic polyneuropathy: Secondary | ICD-10-CM

## 2013-08-31 DIAGNOSIS — D62 Acute posthemorrhagic anemia: Secondary | ICD-10-CM

## 2013-08-31 DIAGNOSIS — L98499 Non-pressure chronic ulcer of skin of other sites with unspecified severity: Secondary | ICD-10-CM

## 2013-08-31 DIAGNOSIS — I739 Peripheral vascular disease, unspecified: Secondary | ICD-10-CM

## 2013-08-31 LAB — COMPREHENSIVE METABOLIC PANEL
ALK PHOS: 68 U/L (ref 39–117)
ALT: 6 U/L (ref 0–53)
AST: 10 U/L (ref 0–37)
Albumin: 2.6 g/dL — ABNORMAL LOW (ref 3.5–5.2)
BUN: 16 mg/dL (ref 6–23)
CHLORIDE: 102 meq/L (ref 96–112)
CO2: 26 mEq/L (ref 19–32)
Calcium: 9 mg/dL (ref 8.4–10.5)
Creatinine, Ser: 1.27 mg/dL (ref 0.50–1.35)
GFR calc non Af Amer: 55 mL/min — ABNORMAL LOW (ref >90)
GFR, EST AFRICAN AMERICAN: 64 mL/min — AB (ref >90)
GLUCOSE: 75 mg/dL (ref 70–99)
POTASSIUM: 3.9 meq/L (ref 3.7–5.3)
Sodium: 144 mEq/L (ref 137–147)
Total Bilirubin: 0.2 mg/dL — ABNORMAL LOW (ref 0.3–1.2)
Total Protein: 6.8 g/dL (ref 6.0–8.3)

## 2013-08-31 LAB — CBC WITH DIFFERENTIAL/PLATELET
Basophils Absolute: 0 10*3/uL (ref 0.0–0.1)
Basophils Relative: 0 % (ref 0–1)
EOS ABS: 0.3 10*3/uL (ref 0.0–0.7)
Eosinophils Relative: 3 % (ref 0–5)
HCT: 32.1 % — ABNORMAL LOW (ref 39.0–52.0)
Hemoglobin: 9.9 g/dL — ABNORMAL LOW (ref 13.0–17.0)
LYMPHS ABS: 3.1 10*3/uL (ref 0.7–4.0)
Lymphocytes Relative: 32 % (ref 12–46)
MCH: 26 pg (ref 26.0–34.0)
MCHC: 30.8 g/dL (ref 30.0–36.0)
MCV: 84.3 fL (ref 78.0–100.0)
Monocytes Absolute: 0.6 10*3/uL (ref 0.1–1.0)
Monocytes Relative: 6 % (ref 3–12)
NEUTROS ABS: 5.7 10*3/uL (ref 1.7–7.7)
NEUTROS PCT: 59 % (ref 43–77)
PLATELETS: 338 10*3/uL (ref 150–400)
RBC: 3.81 MIL/uL — AB (ref 4.22–5.81)
RDW: 15.9 % — ABNORMAL HIGH (ref 11.5–15.5)
WBC: 9.6 10*3/uL (ref 4.0–10.5)

## 2013-08-31 LAB — GLUCOSE, CAPILLARY
GLUCOSE-CAPILLARY: 124 mg/dL — AB (ref 70–99)
Glucose-Capillary: 78 mg/dL (ref 70–99)
Glucose-Capillary: 154 mg/dL — ABNORMAL HIGH (ref 70–99)
Glucose-Capillary: 121 mg/dL — ABNORMAL HIGH (ref 70–99)

## 2013-08-31 MED ORDER — ALPRAZOLAM 0.5 MG PO TABS
4.0000 mg | ORAL_TABLET | Freq: Every day | ORAL | Status: DC
  Administered 2013-08-31 – 2013-09-07 (×8): 4 mg via ORAL
  Filled 2013-08-31 (×8): qty 8

## 2013-08-31 NOTE — Evaluation (Signed)
Occupational Therapy Assessment and Plan & Session Notes  Patient Details  Name: Nicholas [name] MRN: [id number] Date of Birth: 06/22/1941  OT Diagnosis: acute pain, altered mental status and muscle weakness (generalized) Rehab Potential: Rehab Potential: Good ELOS: ~10 days   Today's Date: 08/31/2013  Problem List:  Patient Active Problem List   Diagnosis Date Noted  . S/P AKA (above knee amputation) 08/30/2013  . Sacral decubitus ulcer, stage IV 08/27/2013  . MRSA (methicillin resistant Staphylococcus aureus) infection 08/26/2013  . History of stroke   . Cellulitis of foot 08/24/2013  . Type 2 diabetes mellitus with diabetic nephropathy 06/03/2013  . Pressure ulcer stage II 06/03/2013  . Chronic respiratory failure 05/31/2013  . Chronic diastolic heart failure   . 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 DNR (do not resuscitate) 12/11/2011  . Morbid obesity   . CAD (coronary artery disease), native coronary artery   . Diabetic foot ulcer 08/28/2011  . PAD (peripheral artery disease) 07/29/2011  . Anxiety 07/29/2011  . BPH (benign prostatic hyperplasia) 07/29/2011  . Type 2 diabetes mellitus with vascular disease   . Hypertensive heart disease without CHF   . Hyperlipidemia     Past Medical History:  Past Medical History  Diagnosis Date  . Hypertension   . Peripheral vascular disease   . Ulcer     left foot  . Dyslipidemia   . Chronic venous insufficiency   . Sleep apnea   . Morbid obesity   . Diverticulosis   . Benign prostatic hypertrophy   . Glaucoma   . Gout   . Depression   . Insomnia   . CAD (coronary artery disease), native coronary artery   . GERD (gastroesophageal reflux disease)   . Angina   . Pneumonia 12/11/11  . Type II diabetes mellitus   . Chronic daily headache     "~ all the time"  . Seizures   . Stroke ~ 1983    "little weaker right hand"  . MRSA (methicillin resistant staph aureus)  culture positive Dec. 17, 2013     right heel infection   Past Surgical History:  Past Surgical History  Procedure Laterality Date  . Appendectomy    . Lumbar laminectomy    . Mastoid debridement    . Knee and ankle surgery  1979    "caved in in a ditch; messed up legs"; right  . Cholecystectomy    . Back surgery      "5 or 6"  . Peripheral arterial stent graft      "don't remember which side"  . Coronary artery bypass graft  1983; 1992    "CABG X 5; CABG X 8"  . Lower extremity angiogram  01/21/2012    Procedure: LOWER EXTREMITY ANGIOGRAM;  Surgeon: Nada Libman, MD;  Location: [facility] OR;  Service: Vascular;  Laterality: Bilateral;  WITH POSSIBLE INTERVENTION  . Eye surgery      "laser; both eyes"  . Eye surgery  05/08/2012    Cataract Left eye  . Heel bx  Dec. 17, 2013    Right Heel Bx.    . Transluminal atherectomy popliteal artery  01/21/2012    Left  . Amputation Left 08/27/2013    Procedure: LEFT ABOVE THE KNEE AMPUTATION ;  Surgeon: Pryor Ochoa, MD;  Location: [facility] OR;  Service: Vascular;  Laterality: Left;    Clinical Impression: Nicholas [name] is a 73 y.o. right-handed male with history of  diabetes mellitus or peripheral neuropathy, coronary artery disease and CABG, systolic congestive heart failure, peripheral vascular disease revascularization procedures. Patient used a walker prior to admission. Admitted 08/24/2013 with nonhealing left foot ulcer. Left foot films shows soft tissue ulceration of the heel region no osteomyelitis. No change with conservative care and limb was not felt to be salvageable. 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 Kellie Simmering. Postoperative pain control. Acute blood loss anemia 9.3 and monitored. MRSA PCR screening positive with contact precautions. Physical and occupational therapy evaluations completed with recommendations for physical medicine rehabilitation consult. Patient was admitted for comprehensive rehabilitation  program.  Patient transferred to CIR on 08/30/2013 .    Patient currently requires mod with basic self-care skills secondary to muscle weakness and muscle joint tightness, decreased awareness, decreased problem solving, decreased safety awareness and decreased memory and decreased standing balance, decreased postural control, decreased balance strategies and difficulty maintaining precautions.  Prior to hospitalization, patient could complete ADLs with supervision/set-up assistance from wife.  During evaluation, patient was oriented X3 and presented to be a poor historian. Wife was present and would give accurate answers to questions.   Patient will benefit from skilled intervention to increase independence with basic self-care skills prior to discharge home with care partner.  Anticipate patient will require 24 hour supervision and minimal physical assistance and follow up home health.  OT - End of Session Activity Tolerance: Tolerates 10 - 20 min activity with multiple rests Endurance Deficit: Yes OT Assessment Rehab Potential: Good Barriers to Discharge:  (none known at this time) OT Patient demonstrates impairments in the following area(s): Balance;Cognition;Edema;Endurance;Motor;Pain;Perception;Safety;Sensory;Skin Integrity OT Basic ADL's Functional Problem(s): Grooming;Bathing;Dressing;Toileting OT Advanced ADL's Functional Problem(s):  (n/a at this time) OT Transfers Functional Problem(s): Toilet;Tub/Shower OT Additional Impairment(s): None (strengthening > BUEs) OT Plan OT Intensity: Minimum of 1-2 x/day, 45 to 90 minutes OT Frequency: 5 out of 7 days OT Duration/Estimated Length of Stay: ~10 days OT Treatment/Interventions: Balance/vestibular training;Cognitive remediation/compensation;Community reintegration;Discharge planning;Disease mangement/prevention;DME/adaptive equipment instruction;Functional mobility training;Pain management;Patient/family education;Psychosocial support;Self  Care/advanced ADL retraining;Skin care/wound managment;Therapeutic Activities;Therapeutic Exercise;UE/LE Strength taining/ROM;Wheelchair propulsion/positioning;UE/LE Coordination activities OT Self Feeding Anticipated Outcome(s): Independent OT Basic Self-Care Anticipated Outcome(s): Set-up  OT Toileting Anticipated Outcome(s): supervision OT Bathroom Transfers Anticipated Outcome(s): supervision OT Recommendation Patient destination: Home Follow Up Recommendations: Home health OT Equipment Recommended: 3 in 1 bedside comode;Tub/shower bench  Precautions/Restrictions  Precautions Precautions: Fall Precaution Comments: patient states he has been ambulating minimally for the past 8 years Restrictions Weight Bearing Restrictions: Yes LLE Weight Bearing: Non weight bearing  General Chart Reviewed: Yes Family/Caregiver Present: Yes (wife, Letta Median)  Vital Signs Therapy Vitals Temp: 97.7 F (36.5 C) Temp src: Oral Pulse Rate: 51 Resp: 18 BP: 146/78 mmHg Patient Position, if appropriate: Lying Oxygen Therapy SpO2: 96 % O2 Device: Nasal cannula  Pain Pain Assessment Pain Assessment: No/denies pain Pain Score: 0-No pain  Home Living/Prior Functioning Home Living Family/patient expects to be discharged to:: Private residence Living Arrangements: Spouse/significant other Available Help at Discharge: Family;Available 24 hours/day Type of Home: Apartment Home Access: Stairs to enter Entrance Stairs-Number of Steps: 2 Entrance Stairs-Rails: None Home Layout: One level Additional Comments: patient with lift chair in living room  Lives With: Spouse IADL History Homemaking Responsibilities: No Current License: No Mode of Transportation: Family Type of Occupation: Worked for the city of [place]: "relaxing and watching TV" Prior Function Level of Independence: Needs assistance with ADLs  Able to Take Stairs?: No  ADL - See FIM  Vision/Perception  Vision - History Baseline Vision: Wears glasses only for reading Visual History: Cataracts (patient reports bilateral surgery for cataracts) Patient Visual Report: No change from baseline Perception Perception: Within Functional Limits Praxis Praxis: Intact   Cognition Overall Cognitive Status: Impaired/Different from baseline Orientation Level: Oriented to place;Oriented to person;Oriented to situation Memory: Impaired Awareness: Impaired Problem Solving: Impaired Safety/Judgment: Impaired Comments: patient poor historian, wife present to give accurate answers  Sensation Sensation Additional Comments: Patient reports some numbness and tingling in fingers and toes Coordination Gross Motor Movements are Fluid and Coordinated: Yes Fine Motor Movements are Fluid and Coordinated: Yes  Motor  Motor Motor: Abnormal postural alignment and control  Trunk/Postural Assessment  Cervical Assessment Cervical Assessment: Within Functional Limits Thoracic Assessment Thoracic Assessment: Within Functional Limits Lumbar Assessment Lumbar Assessment: Within Functional Limits Postural Control Postural Control: Deficits on evaluation (during static standing)   Balance Balance Balance Assessed: Yes Static Sitting Balance Static Sitting - Level of Assistance: 5: Stand by assistance Dynamic Sitting Balance Dynamic Sitting - Level of Assistance: 4: Min assist Static Standing Balance Static Standing - Level of Assistance: 4: Min assist (with RW)  Extremity/Trunk Assessment RUE Assessment RUE Assessment: Within Functional Limits (can benefit from BUE strengthening) LUE Assessment LUE Assessment: Within Functional Limits (can benefit from BUE strengthening)  FIM:  FIM - Eating Eating Activity: 5: Set-up assist for open containers FIM - Grooming Grooming: 0: Activity did not occur FIM - Bathing Bathing Steps Patient Completed: Chest;Right Arm;Left Arm;Abdomen;Front perineal  area;Right upper leg;Left upper leg Bathing: 3: Mod-Patient completes 5-7 58f 10 parts or 50-74% FIM - Upper Body Dressing/Undressing Upper body dressing/undressing steps patient completed: Thread/unthread right sleeve of pullover shirt/dresss;Thread/unthread left sleeve of pullover shirt/dress;Put head through opening of pull over shirt/dress;Pull shirt over trunk Upper body dressing/undressing: 5: Set-up assist to: Obtain clothing/put away FIM - Lower Body Dressing/Undressing Lower body dressing/undressing: 1: Total-Patient completed less than 25% of tasks FIM - Toileting Toileting: 0: Activity did not occur FIM - Bed/Chair Transfer Bed/Chair Transfer: 3: Supine > Sit: Mod A (lifting assist/Pt. 50-74%/lift 2 legs FIM - Air cabin crew Transfers: 0-Activity did not occur FIM - Tub/Shower Transfers Tub/shower Transfers: 0-Activity did not occur or was simulated   Refer to Care Plan for Long Term Goals  Recommendations for other services: None at this time  Discharge Criteria: Patient will be discharged from OT if patient refuses treatment 3 consecutive times without medical reason, if treatment goals not met, if there is a change in medical status, if patient makes no progress towards goals or if patient is discharged from hospital.  The above assessment, treatment plan, treatment alternatives and goals were discussed and mutually agreed upon: by patient  --------------------------------------------------------------------------------------------------------------------------  SESSION NOTES  Session #1 [phone] - 60 Minutes Individual Therapy No complaints of pain Initial 1:1 occupational therapy evaluation completed. Focused skilled intervention on bed mobility, dynamic sitting edge of bed, UB/LB bathing & dressing, overall activity tolerance/endurance, and discussion regarding patient's goals. Patient states he wants to get out of the hospital as soon as possible so "I can  sit on my couch and watch TV".  At end of session, left patient seated edge of bed with wife present and all needed items within reach.   Session #2 [id number] - 30 Minutes Individual Therapy Patient with complaints of surgical and phantom pain, RN aware Upon entering room, patient found seated in w/c with complaints of phantom pain and asking therapist what he could do about his "cold foot". Therapist educated patient  on desensitizing techniques to hopefully help eliminate the phantom pain (moving foot in his mind, rubbing residual limb, tapping residual limb). Therapist then educated patient on w/c pushups and patient completed 5 w/c pushups with max encouragement. From here, patient preferred to get back to bed; slide board transfer w/c > edge of bed with moderate assistance. Left patient in supine position with all needed items within reach and wife & brother in room.   Lamar Meter 08/31/2013, 2:31 PM

## 2013-08-31 NOTE — Progress Notes (Signed)
Subjective/Complaints: Restless night. States he didn't get his xanax until too late. It is scheduled for 10pm which is the time he takes it at home  Objective: Vital Signs: Blood pressure 146/78, pulse 51, temperature 97.7 F (36.5 C), temperature source Oral, resp. rate 18, height 5\' 10"  (1.778 m), weight 113.4 kg (250 lb), SpO2 96.00%. No results found.  Recent Labs  08/29/13 0437  WBC 8.7  HGB 9.3*  HCT 30.6*  PLT 286    Recent Labs  08/29/13 0437  NA 140  K 4.4  CL 99  GLUCOSE 76  BUN 10  CREATININE 1.18  CALCIUM 8.6   CBG (last 3)   Recent Labs  08/30/13 1640 08/30/13 2141 08/31/13 0725  GLUCAP 133* 137* 78    Wt Readings from Last 3 Encounters:  08/31/13 113.4 kg (250 lb)  08/30/13 112.8 kg (248 lb 10.9 oz)  08/30/13 112.8 kg (248 lb 10.9 oz)    Physical Exam:  Constitutional: He appears well-developed. No distress.  73 year old obese male  HENT:  Head: Normocephalic and atraumatic.  Right Ear: External ear normal.  Left Ear: External ear normal.  Eyes: Conjunctivae and EOM are normal. Pupils are equal, round, and reactive to light.  Neck: Normal range of motion. Neck supple. No JVD present. No tracheal deviation present. No thyromegaly present.  Cardiovascular: Normal rate, regular rhythm and normal heart sounds. Exam reveals no gallop and no friction rub.  No murmur heard.  Respiratory: Effort normal and breath sounds normal. No respiratory distress. He has no wheezes. He has no rales. Wearing oxygen via [place].    GI: Soft. Bowel sounds are normal. He exhibits no distension. There is no tenderness. There is no rebound.  Musculoskeletal:  Left leg edematous. Right foot with pes planus, degenerative changes.  Lymphadenopathy:  He has no cervical adenopathy.  Neurological:  Patient is appropriate.  . Wife is at bedside. Decreased PP and LT below the knee on the right leg. Sensory lost in fingers and hans as well.  Skin: Amputation site  with staples intact without drainage and appropriately tender  Psychiatric: He has a normal mood and affect. His behavior is normal. Thought content normal   Assessment/Plan: 1. Functional deficits secondary to left AKA which require 3+ hours per day of interdisciplinary therapy in a comprehensive inpatient rehab setting. Physiatrist is providing close team supervision and 24 hour management of active medical problems listed below. Physiatrist and rehab team continue to assess barriers to discharge/monitor patient progress toward functional and medical goals. FIM:                   Comprehension Comprehension Mode: Auditory Comprehension: 5-Understands complex 90% of the time/Cues < 10% of the time  Expression Expression Mode: Verbal Expression: 5-Expresses complex 90% of the time/cues < 10% of the time  Social Interaction Social Interaction: 5-Interacts appropriately 90% of the time - Needs monitoring or encouragement for participation or interaction.  Problem Solving Problem Solving: 5-Solves basic 90% of the time/requires cueing < 10% of the time  Memory Memory: 5-Recognizes or recalls 90% of the time/requires cueing < 10% of the time  Medical Problem List and Plan:  1. 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 DVT Prophylaxis/Anticoagulation: Subcutaneous Lovenox. Monitor platelet counts any signs of bleeding  3. Pain Management: Hydrocodone as needed. Monitor with increased mobility  4. Mood/anxiety. Xanax 0.5 mg twice a day and 2 mg each bedtime. Xanax is scheduled at bedtime per  pt request 5. Neuropsych: This patient is capable of making decisions on his own behalf.  6. Acute blood loss anemia. Latest hemoglobin 9.3. Followup CBC  7. Coronary artery disease with CABG. Continue aspirin and Plavix therapy. No chest pain or shortness of breath.  8. Diabetes mellitus with peripheral neuropathy. Hemoglobin A1c 7.4. Glucotrol 5 mg twice a day, Lantus insulin 5 units daily. Check blood  sugars a.c. and at bedtime.  9. Hypertension. Tenormin 50 mg twice a day, Imdur 30 mg daily. Monitor with increased mobility  10. Systolic congestive heart failure. Continue Lasix. Monitor for any signs of fluid overload.  11. Hyperlipidemia. Zocor.  12. BPH. Flomax 0.8 mg each bedtime. Check PVRs x3  13. Gout. Zyloprim 300 mg daily. Monitor for any signs gout flare up  LOS (Days) 1 A FACE TO FACE EVALUATION WAS PERFORMED  Nicholas [name] T 08/31/2013 7:55 AM

## 2013-08-31 NOTE — Progress Notes (Signed)
Pt refused to have foley discontinued.  Educated pt and family on purpose of foley removal and not having convenience of foley at home. Discussed use of urinal.  Pt continued to refuse for staff to remove foley.

## 2013-08-31 NOTE — Progress Notes (Signed)
Physical Therapy Assessment and Plan  Patient Details  Name: Nicholas [name] MRN: [id number] Date of Birth: 11-13-1940  PT Diagnosis: Abnormal posture, Difficulty walking, Edema, Impaired sensation, Muscle weakness and Pain in joint Rehab Potential: Good ELOS: 10-14 days   Today's Date: 08/31/2013 Time: 0900-1002 Time Calculation (min): 62 min  Problem List:  Patient Active Problem List   Diagnosis Date Noted  . S/P AKA (above knee amputation) 08/30/2013  . Sacral decubitus ulcer, stage IV 08/27/2013  . MRSA (methicillin resistant Staphylococcus aureus) infection 08/26/2013  . History of stroke   . Cellulitis of foot 08/24/2013  . Type 2 diabetes mellitus with diabetic nephropathy 06/03/2013  . Pressure ulcer stage II 06/03/2013  . Chronic respiratory failure 05/31/2013  . Chronic diastolic heart failure   . 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 DNR (do not resuscitate) 12/11/2011  . Morbid obesity   . CAD (coronary artery disease), native coronary artery   . Diabetic foot ulcer 08/28/2011  . PAD (peripheral artery disease) 07/29/2011  . Anxiety 07/29/2011  . BPH (benign prostatic hyperplasia) 07/29/2011  . Type 2 diabetes mellitus with vascular disease   . Hypertensive heart disease without CHF   . Hyperlipidemia     Past Medical History:  Past Medical History  Diagnosis Date  . Hypertension   . Peripheral vascular disease   . Ulcer     left foot  . Dyslipidemia   . Chronic venous insufficiency   . Sleep apnea   . Morbid obesity   . Diverticulosis   . Benign prostatic hypertrophy   . Glaucoma   . Gout   . Depression   . Insomnia   . CAD (coronary artery disease), native coronary artery   . GERD (gastroesophageal reflux disease)   . Angina   . Pneumonia 12/11/11  . Type II diabetes mellitus   . Chronic daily headache     "~ all the time"  . Seizures   . Stroke ~ 1983    "little weaker right hand"  .  MRSA (methicillin resistant staph aureus) culture positive Dec. 17, 2013     right heel infection   Past Surgical History:  Past Surgical History  Procedure Laterality Date  . Appendectomy    . Lumbar laminectomy    . Mastoid debridement    . Knee and ankle surgery  1979    "caved in in a ditch; messed up legs"; right  . Cholecystectomy    . Back surgery      "5 or 6"  . Peripheral arterial stent graft      "don't remember which side"  . Coronary artery bypass graft  1983; 1992    "CABG X 5; CABG X 8"  . Lower extremity angiogram  01/21/2012    Procedure: LOWER EXTREMITY ANGIOGRAM;  Surgeon: Serafina Mitchell, MD;  Location: [facility] OR;  Service: Vascular;  Laterality: Bilateral;  WITH POSSIBLE INTERVENTION  . Eye surgery      "laser; both eyes"  . Eye surgery  05/08/2012    Cataract Left eye  . Heel bx  Dec. 17, 2013    Right Heel Bx.    . Transluminal atherectomy popliteal artery  01/21/2012    Left  . Amputation Left 08/27/2013    Procedure: LEFT ABOVE THE KNEE AMPUTATION ;  Surgeon: Mal Misty, MD;  Location: [place];  Service: Vascular;  Laterality: Left;    Assessment & Plan Clinical Impression: Patient is a  73 y.o. year old male with history of diabetes mellitus or peripheral neuropathy, coronary artery disease and CABG, systolic congestive heart failure, peripheral vascular disease revascularization procedures. Patient used a walker prior to admission. Admitted 08/24/2013 with nonhealing left foot ulcer. Left foot films shows soft tissue ulceration of the heel region no osteomyelitis. No change with conservative care and limb was not felt to be salvageable. 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 Kellie Simmering. Postoperative pain control. Acute blood loss anemia 9.3 and monitored. MRSA PCR screening positive with contact precautions. Physical and occupational therapy evaluations completed with recommendations for physical medicine rehabilitation consult.  Patient  transferred to CIR on 08/30/2013 .   Patient currently requires max with mobility secondary to muscle weakness and L AKA, decreased cardiorespiratoy endurance and decreased sitting balance, decreased standing balance and decreased postural control.  Prior to hospitalization, patient was supervision with mobility and lived with Spouse in a [place] home.  Home access is 1Stairs to enter (can park in certain area to bypass stairs).  Patient will benefit from skilled PT intervention to maximize safe functional mobility, minimize fall risk and decrease caregiver burden for planned discharge home with 24 hour supervision.  Anticipate patient will benefit from follow up [place] at discharge.  PT - End of Session Activity Tolerance: Tolerates 30+ min activity with multiple rests Endurance Deficit: Yes PT Assessment Rehab Potential: Good PT Patient demonstrates impairments in the following area(s): Balance;Edema;Endurance;Motor;Pain;Safety;Sensory;Skin Integrity;Behavior PT Transfers Functional Problem(s): Bed Mobility;Bed to Chair;Car;Furniture PT Plan PT Intensity: Minimum of 1-2 x/day ,45 to 90 minutes PT Frequency: 5 out of 7 days PT Duration Estimated Length of Stay: 10-14 days PT Treatment/Interventions: Discharge planning;Community reintegration;Balance/vestibular training;Disease management/prevention;DME/adaptive equipment instruction;Functional mobility training;Neuromuscular re-education;Pain management;Patient/family education;Skin care/wound management;Therapeutic Activities;Therapeutic Exercise;UE/LE Strength taining/ROM;UE/LE Coordination activities;Wheelchair propulsion/positioning PT Recommendation Follow Up Recommendations: Home health PT;24 hour supervision/assistance Patient destination: Home Equipment Recommended: None recommended by PT Equipment Details: pt already owns RW and w/c. Recommend family brings these items in for further assessment  Skilled Therapeutic  Intervention  Session consisted of pt performing bed mobility with S-Min A, a scoot pivot bed-w/c transfer with Max A, a mat-w/c slide board transfer with Mod-Max A with verbal cueing needed on proper transfer technique, w/c propulsion to and from room with Min A in order to improve functional independence, 2 sit to stands with Max A and the use of a RW, and static stance x 15 seconds with Mod A and the use of a RW in order for pt to improve his standing tolerance. Pt showed poor endurance throughout the therapy session and needed encouragement in order to perform activities.  PT Evaluation Precautions/Restrictions Precautions Precautions: Fall Precaution Comments: patient states he has been ambulating minimally for the past 8 years Restrictions Weight Bearing Restrictions: Yes LLE Weight Bearing: Non weight bearing Pain  Pt c/o pain in L LE. Pt was repositioned and took rest breaks in order to alleviate this pain.  Home Living/Prior Functioning Home Living Available Help at Discharge: Family;Available 24 hours/day Type of Home: Apartment Home Access: Stairs to enter (can park in certain area to bypass stairs) Entrance Stairs-Number of Steps: 1 (but can enter without having to climb the step) Entrance Stairs-Rails: Can reach both Home Layout: One level Additional Comments: patient with lift chair in living room  Lives With: Spouse Prior Function Level of Independence: Needs assistance with tranfers;Needs assistance with gait  Able to Take Stairs?: No  Cognition Overall Cognitive Status: Impaired/Different from baseline Arousal/Alertness: Awake/Alert Orientation Level: Oriented  X4 Safety Awareness: Appears intact  Sensation Sensation Light Touch: Impaired Detail (Absent on plantar and dorsal surface of R foot ) Additional Comments: Patient reports some numbness and tingling in fingers and toes Coordination Gross Motor Movements are Fluid and Coordinated: Yes Fine Motor  Movements are Fluid and Coordinated: Yes Motor  Motor Motor: Abnormal postural alignment and control (poor standing posture)   Trunk/Postural Assessment  Cervical Assessment Cervical Assessment: Within Functional Limits Thoracic Assessment Thoracic Assessment: Within Functional Limits Lumbar Assessment Lumbar Assessment: Within Functional Limits Postural Control Postural Control: Deficits on evaluation (poor static standing posture)  Balance Balance Balance Assessed: Yes Static Sitting Balance Static Sitting - Level of Assistance: 5: Stand by assistance Dynamic Sitting Balance Dynamic Sitting - Level of Assistance: 4: Min assist Static Standing Balance Static Standing - Level of Assistance: 3: Mod assist (with use of the RW) Extremity Assessment  RUE Assessment RUE Assessment: Within Functional Limits (can benefit from BUE strengthening) LUE Assessment LUE Assessment: Within Functional Limits (can benefit from BUE strengthening) RLE Assessment RLE Assessment: Exceptions to [facility] RLE Strength RLE Overall Strength Comments: MMT grades: Hip Flexion 3-/5, Knee Extension 4-/5, Ankle Dorsiflexion and Plantarflexion 4/5  LLE Assessment: AKA, hip flexor strength impaired based off of functional assessment  FIM:  FIM - Bed/Chair Transfer Bed/Chair Transfer Assistive Devices: Arm rests;Sliding board;Bed rails Bed/Chair Transfer: 2: Bed > Chair or W/C: Max A (lift and lower assist);2: Chair or W/C > Bed: Max A (lift and lower assist);4: Supine > Sit: Min A (steadying Pt. > 75%/lift 1 leg);5: Sit > Supine: Supervision (verbal cues/safety issues) FIM - Locomotion: Wheelchair Locomotion: Wheelchair: 2: Travels 50 - 149 ft with minimal assistance (Pt.>75%)   Refer to Care Plan for Long Term Goals  Recommendations for other services: None  Discharge Criteria: Patient will be discharged from PT if patient refuses treatment 3 consecutive times without medical reason, if treatment goals not  met, if there is a change in medical status, if patient makes no progress towards goals or if patient is discharged from hospital.  The above assessment, treatment plan, treatment alternatives and goals were discussed and mutually agreed upon: by patient and by family  Olisa Quesnel 08/31/2013, 10:32 AM

## 2013-08-31 NOTE — Progress Notes (Signed)
Reviewed and in agreement with evaluation and treatment provided.  

## 2013-08-31 NOTE — Progress Notes (Signed)
Patient information reviewed and entered into eRehab system by Kriston Pasquarello, RN, CRRN, PPS Coordinator.  Information including medical coding and functional independence measure will be reviewed and updated through discharge.     Per nursing patient was given "Data Collection Information Summary for Patients in Inpatient Rehabilitation Facilities with attached "Privacy Act Statement-Health Care Records" upon admission.  

## 2013-08-31 NOTE — Progress Notes (Signed)
Occupational Therapy Session Note  Patient Details  Name: Nicholas CooterJames D Traywick MRN: [id number] Date of Birth: 1940/10/26  Today's Date: 08/31/2013 Time: 4098-11911330-1415 Time Calculation (min): 45 min  Skilled Therapeutic Interventions/Progress Updates:    Pt practiced bed to wheelchair transfers with max assist without use of a sliding board.  Had him roll his wheelchair down to the gym with supervision.  Performed 2 sit to stands at one of the parallel bars with max assist.  Only able to maintain standing for 15-20 seconds and maintained flexed trunk propping on the bar instead of standing up tall.  Transitioned to transferring to the mat with the sliding board with min assist.  Pt able to place and remove board with supervision and min instructional cueing as well.  On the mat incorporated a use of theraband for simulated clothing.  Pt able to perform lateral leans side to side down on his elbow and pull theraband over his hips.  Transferred back to the wheelchair with min assist using the sliding board as well.    Therapy Documentation Precautions:  Precautions Precautions: Fall Precaution Comments: patient states he has been ambulating minimally for the past 8 years Restrictions Weight Bearing Restrictions: Yes LLE Weight Bearing: Non weight bearing  Pain: Pain Assessment Pain Score: 6  Pain Type: Surgical pain Pain Location: Leg Pain Orientation: Left Pain Intervention(s): Medication (See eMAR) ADL: See FIM for current functional status  Therapy/Group: Individual Therapy  Rufus Beske,Vyron OTR/L 08/31/2013, 3:57 PM

## 2013-09-01 ENCOUNTER — Inpatient Hospital Stay (HOSPITAL_COMMUNITY): Payer: Medicare Other

## 2013-09-01 ENCOUNTER — Inpatient Hospital Stay (HOSPITAL_COMMUNITY): Payer: Medicare Other | Admitting: Occupational Therapy

## 2013-09-01 LAB — GLUCOSE, CAPILLARY
GLUCOSE-CAPILLARY: 127 mg/dL — AB (ref 70–99)
GLUCOSE-CAPILLARY: 112 mg/dL — AB (ref 70–99)
GLUCOSE-CAPILLARY: 94 mg/dL (ref 70–99)
Glucose-Capillary: 107 mg/dL — ABNORMAL HIGH (ref 70–99)

## 2013-09-01 NOTE — Progress Notes (Signed)
Reviewed and in agreement with treatment provided.  

## 2013-09-01 NOTE — Progress Notes (Signed)
Occupational Therapy Session Note  Patient Details  Name: Nicholas [name] MRN: [id number] Date of Birth: 29-Jul-1941  Today's Date: 09/01/2013 Time: 1000-1040 Time Calculation (min): 40 min  Short Term Goals: Week 1:  OT Short Term Goal 1 (Week 1): Short Term Goals = Long Term Goals  Skilled Therapeutic Interventions/Progress Updates:    Pt was seated in wc upon arrival.  Pt practiced several sliding board transfers from wc<>drop arm BSC requiring steady A.  Pt then participated in arm strengthening exercises at wc level.  Pt/therapist discussed home environment and the need for a drop arm BSC.  Pt had no c/o pain.    Therapy Documentation Precautions:  Precautions Precautions: Fall Precaution Comments: patient states he has been ambulating minimally for the past 8 years Restrictions Weight Bearing Restrictions: Yes LLE Weight Bearing: Non weight bearing  See FIM for current functional status  Therapy/Group: Individual Therapy  Nicholas [name] 09/01/2013, 11:23 AM

## 2013-09-01 NOTE — Progress Notes (Signed)
Physical Therapy Session Note  Patient Details  Name: Nicholas [name] Suit MRN: [id number] Date of Birth: June 24, 1941  Today's Date: 09/01/2013 Time: 4098-11911333-1433 Time Calculation (min): 60 min  Short Term Goals: Week 1:  PT Short Term Goal 1 (Week 1): Pt will perform all bed mobility with S PT Short Term Goal 2 (Week 1): Pt will perform bed to w/c transfer with Min A PT Short Term Goal 3 (Week 1): Pt will maintain static standing position for 1 min with Min A and use of AD  Skilled Therapeutic Interventions/Progress Updates:    Session consisted of pt performing bed mobility with S, bed to w/c transfer x 3 and w/c to bed transfer x 2 with S and the use of the slideboard (pt needed setup and verbal cues throughout), performing w/c propulsion from the bedroom to the gym in order for pt to improve his functional independence, performing 4 sit to stand transfers with Min A and the use of a RW while focusing on maintaining static stance for as long as possible (pt continues to show inability to maintain fully erect static standing position and could not maintain static standing position for more than 12 seconds), scooting at the edge of the 4 x 6 feet with supervision in order for pt to improve ability to perform transfers, and pt performing a balance task where pt was asked to sit and reach for rings then toss them towards a target in order for pt to improve his dynamic balance. Pt is making progress with his transfers but continues to fatigue very easily and needs to take multiple rest breaks throughout the PT session.  Therapy Documentation Precautions:  Precautions Precautions: Fall Precaution Comments: patient states he has been ambulating minimally for the past 8 years Restrictions Weight Bearing Restrictions: Yes LLE Weight Bearing: Non weight bearing  Pain:  Pt c/o pain in his LLE. Pt took multiple rest breaks and was repositioned at the end of the session in order to alleviate this pain.    See FIM for current functional status  Therapy/Group: Individual Therapy  Teague Goynes 09/01/2013, 4:22 PM

## 2013-09-01 NOTE — Progress Notes (Signed)
Occupational Therapy Session Note  Patient Details  Name: Nicholas CooterJames D [name] MRN: [id number] Date of Birth: 13-Feb-1941  Today's Date: 09/01/2013 Time: 0730-0830 Time Calculation (min): 60 min  Short Term Goals: Week 1:  OT Short Term Goal 1 (Week 1): Short Term Goals = Long Term Goals  Skilled Therapeutic Interventions/Progress Updates:  Upon entering room, patient found side lying in bed with wife present and 4/10 complaints of pain in left residual limb(RN aware). Patient stated he already completed bathing & dressing this am. Patient sat edge of bed, then transferred EOB > w/c using slide board with maximal assistance. Patient sat at sink to wash hands, then propelled self from room > therapy gym. UB strengthening exercises completed using ergometer for 10 minutes (frontward & backward movements). Patient then remained seated in w/c and completed BUE strengthening exercises using 3lb weighted bar(elbow and shoulder exercises). During session, discussed a "normal routine" for patient during the day; patient stated he never completed any chores around the house and only watched tv during the day while seated on the couch after bathing & dressing. Patient's wife present during some of the session and seemed to be increasingly tired; therapist talked with her about taking care of herself too. Therapist assisted patient back to his room and left in w/c with wife and NT present.  Precautions:  Precautions Precautions: Fall Precaution Comments: patient states he has been ambulating minimally for the past 8 years Restrictions Weight Bearing Restrictions: Yes LLE Weight Bearing: Non weight bearing  See FIM for current functional status  Therapy/Group: Individual Therapy  Akshaj Besancon 09/01/2013, 8:30 AM

## 2013-09-01 NOTE — Progress Notes (Signed)
Physical Therapy Session Note  Patient Details  Name: Nicholas [name] MRN: [id number] Date of Birth: [birth year]  Today's Date: 09/01/2013 Time: 1129-1204 Time Calculation (min): 35 min  Short Term Goals: Week 1:  PT Short Term Goal 1 (Week 1): Pt will perform all bed mobility with S PT Short Term Goal 2 (Week 1): Pt will perform bed to w/c transfer with Min A PT Short Term Goal 3 (Week 1): Pt will maintain static standing position for 1 min with Min A and use of AD  Skilled Therapeutic Interventions/Progress Updates:    Session consisted of pt performing bed - w/c transfer x 3 with slideboard and pt assistance level being Min A for the first 2 transfers and Mod A for the final transfer (pt showing improvement from yesterday with setup and proper transferring technique), pt performing w/c propulsion from his room to the gym with S (verbal cueing needed in order for pt to avoid certain objects), and pt performing 5 sit to stand transfers with Mat slightly elevated requiring Min-Mod A and the use of a RW to perform (pt unable to obtain a fully erect static standing position which according to pt is consistent to how he stood prior to his amputation and pt also unable to maintain static stance for more than 5 seconds). Also discussed with pt and his family about his current situation with the w/c that he owns. According to pt and family he feels unsafe on the w/c he owns and has owned it for 8 years. His son told me that he could bring in his w/c tomorrow morning for us to look at.   Therapy Documentation Precautions:  Precautions Precautions: Fall Precaution Comments: patient states he has been ambulating minimally for the past 8 years Restrictions Weight Bearing Restrictions: Yes LLE Weight Bearing: Non weight bearing  Pain:  Pt had no c/o pain during the session.  See FIM for current functional status  Therapy/Group: Individual Therapy  Jodi Criscuolo 09/01/2013, 12:10 PM

## 2013-09-01 NOTE — Patient Care Conference (Signed)
Inpatient RehabilitationTeam Conference and Plan of Care Update Date: 08/31/2013   Time: 2:20 PM    Patient Name: Nicholas [name]      Medical Record Number: [id number]  Date of Birth: May 24, 1941 Sex: Male         Room/Bed: 4W06C/4W06C-01 Payor Info: Payor: MEDICARE / Plan: MEDICARE PART A AND B / Product Type: *No Product type* /    Admitting Diagnosis: L AKA  Admit Date/Time:  08/30/2013  4:28 PM Admission Comments: No comment available   Primary Diagnosis:  <principal problem not specified> Principal Problem: <principal problem not specified>  Patient Active Problem List   Diagnosis Date Noted  . S/P AKA (above knee amputation) 08/30/2013  . Sacral decubitus ulcer, stage IV 08/27/2013  . MRSA (methicillin resistant Staphylococcus aureus) infection 08/26/2013  . History of stroke   . Cellulitis of foot 08/24/2013  . Type 2 diabetes mellitus with diabetic nephropathy 06/03/2013  . Pressure ulcer stage II 06/03/2013  . Chronic respiratory failure 05/31/2013  . Chronic diastolic heart failure   . 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 DNR (do not resuscitate) 12/11/2011  . Morbid obesity   . CAD (coronary artery disease), native coronary artery   . Diabetic foot ulcer 08/28/2011  . PAD (peripheral artery disease) 07/29/2011  . Anxiety 07/29/2011  . BPH (benign prostatic hyperplasia) 07/29/2011  . Type 2 diabetes mellitus with vascular disease   . Hypertensive heart disease without CHF   . Hyperlipidemia     Expected Discharge Date: Expected Discharge Date: 09/11/13  Team Members Present: Physician leading conference: Dr. Faith RogueZachary [name] Social Worker Present: Nicholas JupiterLucy Treyten Monestime, LCSW Nurse Present: Nicholas PortelaJeanna Hicks, RN PT Present: Nicholas [name], Nicholas [name];Nicholas [name], PT OT Present: Nicholas [name], Nicholas [name];Nicholas [name], OT;Nicholas [name], OT PPS Coordinator present : Nicholas DuckMarie Noel, RN, CRRN     Current Status/Progress Goal Weekly Team Focus   Medical   left AKA, copd, dm  wound care, pain mgt, increase functional ability  wound care, sleep   Bowel/Bladder   continent of bowel. last bm 1/26. foley catheter in place.   discontinuation of foley catheter. remain continent of bowel.  remain continent    Swallow/Nutrition/ Hydration             ADL's   min for UB ADLs >total assist LB ADLs  supervision > min assist at a w/c level  ADL retraining in seated position, overall awarness/problem solving, overall activity tolerance/endurance, w/c mobility, UB strengthening   Mobility   Max A transfers, Min A w/c mobility and bed mobility  S w/c level  transfers, bed mobility, w/c mobility, standing and sitting balance   Communication             Safety/Cognition/ Behavioral Observations            Pain   pain scale of 4/10 to L aka.   pain less than 2  monitor for pain q shift and prn   Skin   cellulitis to LL extremity  no new skin breakdown  monitor for skin q shift and prn    Rehab Goals Patient on target to meet rehab goals: Yes *See Care Plan and progress notes for long and short-term goals.  Barriers to Discharge: premorbid inactivity,     Possible Resolutions to Barriers:  adaptive equipment, family ed, w/c goals    Discharge Planning/Teaching Needs:  home with wife who can provide supervision only      Team Discussion:  New eval.  Supervision w/c goals overall - NO ambulation goals.  To remove foley today.  Revisions to Treatment Plan:  None   Continued Need for Acute Rehabilitation Level of Care: The patient requires daily medical management by a physician with specialized training in physical medicine and rehabilitation for the following conditions: Daily direction of a multidisciplinary physical rehabilitation program to ensure safe treatment while eliciting the highest outcome that is of practical value to the patient.: Yes Daily medical management of patient stability for increased activity during  participation in an intensive rehabilitation regime.: Yes Daily analysis of laboratory values and/or radiology reports with any subsequent need for medication adjustment of medical intervention for : Post surgical problems;Pulmonary problems;Other  Nicholas [name] 09/01/2013, 3:40 PM

## 2013-09-01 NOTE — Progress Notes (Signed)
Note reviewed and accurately reflects treatment session.   

## 2013-09-01 NOTE — IPOC Note (Signed)
Overall Plan of Care [facility](IPOC) Patient Details Name: Nicholas [name] MRN: [id number] DOB: 10-03-1940  Admitting Diagnosis: L AKA  Hospital Problems: Active Problems:   S/P AKA (above knee amputation)     Functional Problem List: Nursing Bladder;Bowel;Pain;Safety;Skin Integrity  PT Balance;Edema;Endurance;Motor;Pain;Safety;Sensory;Skin Integrity;Behavior  OT Balance;Cognition;Edema;Endurance;Motor;Pain;Perception;Safety;Sensory;Skin Integrity  SLP    TR         Basic ADL's: OT Grooming;Bathing;Dressing;Toileting     Advanced  ADL's: OT  (n/a at this time)     Transfers: PT Bed Mobility;Bed to Chair;Car;Furniture  OT Toilet;Tub/Shower     Locomotion: PT       Additional Impairments: OT None (strengthening > BUEs)  SLP        TR      Anticipated Outcomes Item Anticipated Outcome  Self Feeding Independent  Swallowing      Basic self-care  Set-up   Toileting  supervision   Bathroom Transfers supervision  Bowel/Bladder  Pt. will remain continent to bowel and bladder.Foley catheter will be remove as soon as recomended by MD.  Transfers     Locomotion     Communication     Cognition     Pain  Keep pain level 2 on scale 1 to 10  Safety/Judgment  Pt. will stay out of fall during the stay at rehab   Therapy Plan: PT Intensity: Minimum of 1-2 x/day ,45 to 90 minutes PT Frequency: 5 out of 7 days PT Duration Estimated Length of Stay: 10-14 days OT Intensity: Minimum of 1-2 x/day, 45 to 90 minutes OT Frequency: 5 out of 7 days OT Duration/Estimated Length of Stay: ~10 days         Team Interventions: Nursing Interventions Skin Care/Wound Management;Patient/Family Education;Disease Management/Prevention;Pain Management;Discharge Planning  PT interventions Discharge planning;Community reintegration;Balance/vestibular training;Disease management/prevention;DME/adaptive equipment instruction;Functional mobility training;Neuromuscular re-education;Pain  management;Patient/family education;Skin care/wound management;Therapeutic Activities;Therapeutic Exercise;UE/LE Strength taining/ROM;UE/LE Coordination activities;Wheelchair propulsion/positioning  OT Interventions Balance/vestibular training;Cognitive remediation/compensation;Community reintegration;Discharge planning;Disease mangement/prevention;DME/adaptive equipment instruction;Functional mobility training;Pain management;Patient/family education;Psychosocial support;Self Care/advanced ADL retraining;Skin care/wound managment;Therapeutic Activities;Therapeutic Exercise;UE/LE Strength taining/ROM;Wheelchair propulsion/positioning;UE/LE Coordination activities  SLP Interventions    TR Interventions    SW/CM Interventions      Team Discharge Planning: Destination: PT-Home ,OT- Home , SLP-  Projected Follow-up: PT-Home health PT;24 hour supervision/assistance, OT-  Home health OT, SLP-  Projected Equipment Needs: PT-None recommended by PT, OT- 3 in 1 bedside comode;Tub/shower bench, SLP-  Equipment Details: PT-pt already owns RW and w/c. Recommend family brings these items in for further assessment, OT-  Patient/family involved in discharge planning: PT- Patient;Family member/caregiver,  OT-Patient;Family member/caregiver, SLP-   MD ELOS: 10 days Medical Rehab Prognosis:  Excellent Assessment: The patient has been admitted for CIR therapies. The team will be addressing, functional mobility, strength, stamina, balance, safety, adaptive techniques/equipment, self-care, bowel and bladder mgt, patient and caregiver education, prosthetic education, pain mgt. Goals have been set at supervision for self-care and basic mobility.    Nicholas OysterZachary T. Kipper Buch, MD, FAAPMR      See Team Conference Notes for weekly updates to the plan of care

## 2013-09-02 ENCOUNTER — Inpatient Hospital Stay (HOSPITAL_COMMUNITY): Payer: Medicare Other

## 2013-09-02 ENCOUNTER — Inpatient Hospital Stay (HOSPITAL_COMMUNITY): Payer: Medicare Other | Admitting: Occupational Therapy

## 2013-09-02 DIAGNOSIS — E1149 Type 2 diabetes mellitus with other diabetic neurological complication: Secondary | ICD-10-CM

## 2013-09-02 DIAGNOSIS — E1142 Type 2 diabetes mellitus with diabetic polyneuropathy: Secondary | ICD-10-CM

## 2013-09-02 DIAGNOSIS — D62 Acute posthemorrhagic anemia: Secondary | ICD-10-CM

## 2013-09-02 DIAGNOSIS — L98499 Non-pressure chronic ulcer of skin of other sites with unspecified severity: Secondary | ICD-10-CM

## 2013-09-02 DIAGNOSIS — S88119A Complete traumatic amputation at level between knee and ankle, unspecified lower leg, initial encounter: Secondary | ICD-10-CM

## 2013-09-02 DIAGNOSIS — I739 Peripheral vascular disease, unspecified: Secondary | ICD-10-CM

## 2013-09-02 LAB — GLUCOSE, CAPILLARY
GLUCOSE-CAPILLARY: 58 mg/dL — AB (ref 70–99)
Glucose-Capillary: 100 mg/dL — ABNORMAL HIGH (ref 70–99)
Glucose-Capillary: 103 mg/dL — ABNORMAL HIGH (ref 70–99)
Glucose-Capillary: 79 mg/dL (ref 70–99)
Glucose-Capillary: 62 mg/dL — ABNORMAL LOW (ref 70–99)
Glucose-Capillary: 67 mg/dL — ABNORMAL LOW (ref 70–99)
Glucose-Capillary: 80 mg/dL (ref 70–99)

## 2013-09-02 NOTE — Progress Notes (Signed)
Reviewed and in agreement with treatment provided.  

## 2013-09-02 NOTE — Progress Notes (Signed)
Physical Therapy Session Note  Patient Details  Name: Nicholas [name] MRN: [id number] Date of Birth: Aug 07, 1940  Today's Date: 09/02/2013 Time: 0830-0930 Time Calculation (min): 60 min  Short Term Goals: Week 1:  PT Short Term Goal 1 (Week 1): Pt will perform all bed mobility with S PT Short Term Goal 2 (Week 1): Pt will perform bed to w/c transfer with Min A PT Short Term Goal 3 (Week 1): Pt will maintain static standing position for 1 min with Min A and use of AD  Skilled Therapeutic Interventions/Progress Updates:    Session consisted of pt performing a bed to w/c and w/c to bed slideboard transfer and scoot pivot transfer with Min A (pt needed set up, verbal cueing, and steadying in order to perform transfer), pt performing a scoot pivot w/c to car scoot pivot transfer with Min A and a car to w/c slideboard transfer with Min A, performing bed mobility in the apartment with S, and performing w/c propulsion from his room to the car transfer room and back while also performing a task where he was ask to swerve between cones (pt able to perform this task with Mod I) in order for pt to improve his functional independence. Pt continues to improve with his transfers daily but also continues to show poor endurance which is evident by the pt needing to take multiple rest breaks throughout the PT session.   Therapy Documentation Precautions:  Precautions Precautions: Fall Precaution Comments: patient states he has been ambulating minimally for the past 8 years Restrictions Weight Bearing Restrictions: Yes LLE Weight Bearing: Non weight bearing    Pain:  Pt c/o 8/10 pain in his LLE. Pt informed me that his LLE felt better after resting in so multiple rest breaks were taken to help alleviate this pain. Pt was also repositioned in his bed at the end of the PT session in order to help reduce the pain.  See FIM for current functional status  Therapy/Group: Individual  Therapy  Vesper Trant 09/02/2013, 11:38 AM

## 2013-09-02 NOTE — Progress Notes (Signed)
Inpatient Rehabilitation Center Individual Statement of Services  Patient Name:  Nicholas [name]  Date:  09/02/2013  Welcome to the Inpatient Rehabilitation Center.  Our goal is to provide you with an individualized program based on your diagnosis and situation, designed to meet your specific needs.  With this comprehensive rehabilitation program, you will be expected to participate in at least 3 hours of rehabilitation therapies Monday-Friday, with modified therapy programming on the weekends.  Your rehabilitation program will include the following services:  Physical Therapy (PT), Occupational Therapy (OT), 24 hour per day rehabilitation nursing, Therapeutic Recreaction (TR), Neuropsychology, Case Management (Social Worker), Rehabilitation Medicine, Nutrition Services and Pharmacy Services  Weekly team conferences will be held on Tuesdays to discuss your progress.  Your Social Worker will talk with you frequently to get your input and to update you on team discussions.  Team conferences with you and your family in attendance may also be held.  Expected length of stay: 14 days  Overall anticipated outcome: supervision w/c goals  Depending on your progress and recovery, your program may change. Your Social Worker will coordinate services and will keep you informed of any changes. Your Social Worker's name and contact numbers are listed  below.  The following services may also be recommended but are not provided by the Inpatient Rehabilitation Center:   Driving Evaluations  Home Health Rehabiltiation Services  Outpatient Rehabilitation Services  Vocational Rehabilitation   Arrangements will be made to provide these services after discharge if needed.  Arrangements include referral to agencies that provide these services.  Your insurance has been verified to be:  Medicare and [employer] [place] Your primary doctor is:  Dr. Marden Nobleobert Gates  Pertinent information will be shared with your doctor  and your insurance company.  Social Worker:  [place] Swayzie [place], [place] [phone] or ([phone]) [phone]   Information discussed with and copy given to patient by: [name], [name], 09/02/2013, 9:05 PM

## 2013-09-02 NOTE — Progress Notes (Signed)
Social Work  Social Work Assessment and Plan  Patient Details  Name: Nicholas [name] MRN: [id number] Date of Birth: 1940/08/11  Today's Date: 09/02/2013  Problem List:  Patient Active Problem List   Diagnosis Date Noted  . S/P AKA (above knee amputation) 08/30/2013  . Sacral decubitus ulcer, stage IV 08/27/2013  . MRSA (methicillin resistant Staphylococcus aureus) infection 08/26/2013  . History of stroke   . Cellulitis of foot 08/24/2013  . Type 2 diabetes mellitus with diabetic nephropathy 06/03/2013  . Pressure ulcer stage II 06/03/2013  . Chronic respiratory failure 05/31/2013  . Chronic diastolic heart failure   . 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 DNR (do not resuscitate) 12/11/2011  . Morbid obesity   . CAD (coronary artery disease), native coronary artery   . Diabetic foot ulcer 08/28/2011  . PAD (peripheral artery disease) 07/29/2011  . Anxiety 07/29/2011  . BPH (benign prostatic hyperplasia) 07/29/2011  . Type 2 diabetes mellitus with vascular disease   . Hypertensive heart disease without CHF   . Hyperlipidemia    Past Medical History:  Past Medical History  Diagnosis Date  . Hypertension   . Peripheral vascular disease   . Ulcer     left foot  . Dyslipidemia   . Chronic venous insufficiency   . Sleep apnea   . Morbid obesity   . Diverticulosis   . Benign prostatic hypertrophy   . Glaucoma   . Gout   . Depression   . Insomnia   . CAD (coronary artery disease), native coronary artery   . GERD (gastroesophageal reflux disease)   . Angina   . Pneumonia 12/11/11  . Type II diabetes mellitus   . Chronic daily headache     "~ all the time"  . Seizures   . Stroke ~ 1983    "little weaker right hand"  . MRSA (methicillin resistant staph aureus) culture positive Dec. 17, 2013     right heel infection   Past Surgical History:  Past Surgical History  Procedure Laterality Date  . Appendectomy    .  Lumbar laminectomy    . Mastoid debridement    . Knee and ankle surgery  1979    "caved in in a ditch; messed up legs"; right  . Cholecystectomy    . Back surgery      "5 or 6"  . Peripheral arterial stent graft      "don't remember which side"  . Coronary artery bypass graft  1983; 1992    "CABG X 5; CABG X 8"  . Lower extremity angiogram  01/21/2012    Procedure: LOWER EXTREMITY ANGIOGRAM;  Surgeon: Nada Libman, MD;  Location: [facility] OR;  Service: Vascular;  Laterality: Bilateral;  WITH POSSIBLE INTERVENTION  . Eye surgery      "laser; both eyes"  . Eye surgery  05/08/2012    Cataract Left eye  . Heel bx  Dec. 17, 2013    Right Heel Bx.    . Transluminal atherectomy popliteal artery  01/21/2012    Left  . Amputation Left 08/27/2013    Procedure: LEFT ABOVE THE KNEE AMPUTATION ;  Surgeon: Pryor Ochoa, MD;  Location: [facility] OR;  Service: Vascular;  Laterality: Left;   Social History:  reports that he quit smoking about 22 years ago. His smoking use included Cigarettes. He has a 20 pack-year smoking history. He quit smokeless tobacco use about 26 years ago. His smokeless tobacco use  included Chew. He reports that he does not drink alcohol or use illicit drugs.  Family / Support Systems Marital Status: Married How Long?: 54 yrs Patient Roles: Spouse Spouse/Significant Other: wife, Reather Laurenceudrey [place] @ (H) [phone] or ([phone]) [phone] Children: 4 adult sons with two living locally.  Fayrene FearingJames is primary support Anticipated Caregiver: Wife and two sons may come by to help as needed Ability/Limitations of Caregiver: wife is unable to provide heavy physical assistance as she uses a walker, wife can provided 24 hr superv., 2 adult sons are supportive as well   Caregiver Availability: 24/7 ("somebody can always be there" (wife or sons)) Family Dynamics: family supportive and have been providing assistance for some time PTA  Social History Preferred language: English Religion: Methodist Cultural  Background: NA Education: HS Read: Yes Write: Yes Employment Status: Disabled Date 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  Pleasant and appreciative of SW involvement.  Denies any emotional distress currently and notes he is "actually happy" with loss of limb as his overall pain is significantly reduced.  No s/s of depression or anxiety - will monitor and request neuropsych involvment as warranted. Recent Psychosocial Issues: none other than generally declining health Pyschiatric History: None Substance Abuse History: None  Patient / Family Perceptions, Expectations & Goals Pt/Family understanding of illness & functional limitations: pt and family with basic understanding of medical issues leading to BKA and of current functional limitations/ need for CIR. Premorbid pt/family roles/activities: Pt was generally independent at home with adaptive equipment.  Wife and adult children providing majority of home manaement. 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  Occasionally his wife would drive. Resource referrals recommended: Neuropsychology;Support group (specify) ([facility])  Discharge  Planning Living Arrangements: Spouse/significant other;Children Support Systems: Spouse/significant other;Children Type of Residence: Private residence Insurance Resources: AdministratorMedicare;Private Insurance (specify) ([employer]) Surveyor, quantityinancial Resources: Restaurant manager, fast foodocial Security Financial Screen Referred: No Living Expenses: Rent Money Management: Spouse Does the patient have any problems obtaining your medications?: No Home Management: wife and children  Patient/Family Preliminary Plans: pt to return home with his wife as primary support - intermittent assist of adult children Social Work Anticipated Follow Up Needs: HH/OP;Support Group Expected length of stay: 10-13 days  Clinical Impression Pleasant, oriented gentleman here following L AKA and with chronic health problems.  Multiple family members at bedside including wife with all reporting that they are prepared to provide 24/7 assist.  Pt denying any significant emotional distress and actually notes that his pain level is much improved.  Will follow for support and d/c planning needs.  Kyngston Pickelsimer 09/02/2013, 9:02 PM

## 2013-09-02 NOTE — Progress Notes (Signed)
Physical Therapy Session Note  Patient Details  Name: Nicholas [name] MRN: [id number] Date of Birth: July 28, 1941  Today's Date: 09/02/2013 Time: [id number] Time Calculation (min): 41 min  Short Term Goals: Week 1:  PT Short Term Goal 1 (Week 1): Pt will perform all bed mobility with S PT Short Term Goal 2 (Week 1): Pt will perform bed to w/c transfer with Min A PT Short Term Goal 3 (Week 1): Pt will maintain static standing position for 1 min with Min A and use of AD  Skilled Therapeutic Interventions/Progress Updates:    Session consisted of pt performing w/c propulsion from his room to the gym and back in order to improve functional independence, performing a w/c to mat and mat to w/c scoot pivot transfer with Min A, performing an activity where pt was asked to laterally lean down on the mat and push himself back up (activity performed bilaterally) in order for pt to help improve his independence with bed mobility, pt performing 3 sit to stand transfers with Min A and an elevated mat with patient being asked to maintain a static standing position for as long as possible (longest pt was able to maintain standing position was 10 seconds), and pt performing a w/c to bed scoot pivot transfer with Mod A (Assistance level was due to pt fatigue at the end of the session). Pt's family also brought in his w/c today and we told them we would make sure the pt used his own w/c tomorrow so we can assess how it functions.   Therapy Documentation Precautions:  Precautions Precautions: Fall Precaution Comments: patient states he has been ambulating minimally for the past 8 years Restrictions Weight Bearing Restrictions: Yes LLE Weight Bearing: Non weight bearing  Pain:  Pt c/o left leg pain which he stated hurt more when performing exercises. Pt took rest breaks and was repositioned in the bed at the end of the session in order to alleviate this pain.  See FIM for current functional  status  Therapy/Group: Individual Therapy  Cloyce Blankenhorn 09/02/2013, 3:26 PM

## 2013-09-02 NOTE — Progress Notes (Signed)
Social Work Patient ID: Nicholas [name], male   DOB: [birth year], 73 y.o.   MRN: [id number]  Met with pt and family today to review team conference. All aware and agreeable with targeted d/c date of 2/7 with supervision w/c goals overall.  Pt pleased with progress, however, hopes tx team "...will take it slow with me..."  Will continue to follow for support and d/c planning needs. Deanndra Kirley, LCSW

## 2013-09-02 NOTE — Progress Notes (Signed)
Subjective/Complaints: Restless night. States he didn't get his xanax until too late. It is scheduled for 10pm which is the time he takes it at home  Objective: Vital Signs: Blood pressure 108/50, pulse 66, temperature 98.2 F (36.8 C), temperature source Oral, resp. rate 19, height 5\' 10"  (1.778 m), weight 113.8 kg (250 lb 14.1 oz), SpO2 100.00%. No results found.  Recent Labs  08/31/13 0739  WBC 9.6  HGB 9.9*  HCT 32.1*  PLT 338    Recent Labs  08/31/13 0739  NA 144  K 3.9  CL 102  GLUCOSE 75  BUN 16  CREATININE 1.27  CALCIUM 9.0   CBG (last 3)   Recent Labs  09/01/13 1657 09/01/13 2115 09/02/13 0721  GLUCAP 112* 94 100*    Wt Readings from Last 3 Encounters:  09/02/13 113.8 kg (250 lb 14.1 oz)  08/30/13 112.8 kg (248 lb 10.9 oz)  08/30/13 112.8 kg (248 lb 10.9 oz)    Physical Exam:  Constitutional: He appears well-developed. No distress.  73 year old obese male  HENT:  Head: Normocephalic and atraumatic.  Right Ear: External ear normal.  Left Ear: External ear normal.  Eyes: Conjunctivae and EOM are normal. Pupils are equal, round, and reactive to light.  Neck: Normal range of motion. Neck supple. No JVD present. No tracheal deviation present. No thyromegaly present.  Cardiovascular: Normal rate, regular rhythm and normal heart sounds. Exam reveals no gallop and no friction rub.  No murmur heard.  Respiratory: Effort normal and breath sounds normal. No respiratory distress. He has no wheezes. He has no rales. Wearing oxygen via [place].    GI: Soft. Bowel sounds are normal. He exhibits no distension. There is no tenderness. There is no rebound.  Musculoskeletal:  Left leg edematous. Right foot with pes planus, degenerative changes.  Lymphadenopathy:  He has no cervical adenopathy.  Neurological:  Patient is appropriate.  . Wife is at bedside. Decreased PP and LT below the knee on the right leg. Sensory lost in fingers and hans as well.  Skin:  Amputation site with staples intact without drainage and appropriately tender  Psychiatric: He has a normal mood and affect. His behavior is normal. Thought content normal   Assessment/Plan: 1. Functional deficits secondary to left AKA which require 3+ hours per day of interdisciplinary therapy in a comprehensive inpatient rehab setting. Physiatrist is providing close team supervision and 24 hour management of active medical problems listed below. Physiatrist and rehab team continue to assess barriers to discharge/monitor patient progress toward functional and medical goals.  Stump sock requested per Advanced/ prosthetic education as well.  FIM: FIM - Bathing Bathing Steps Patient Completed: Chest;Right Arm;Left Arm;Abdomen;Front perineal area;Right upper leg;Left upper leg Bathing: 3: Mod-Patient completes 5-7 76f 10 parts or 50-74%  FIM - Upper Body Dressing/Undressing Upper body dressing/undressing steps patient completed: Thread/unthread right sleeve of pullover shirt/dresss;Thread/unthread left sleeve of pullover shirt/dress;Put head through opening of pull over shirt/dress;Pull shirt over trunk Upper body dressing/undressing: 5: Set-up assist to: Obtain clothing/put away FIM - Lower Body Dressing/Undressing Lower body dressing/undressing: 1: Total-Patient completed less than 25% of tasks  FIM - Toileting Toileting: 1: Two helpers  FIM - Diplomatic Services operational officer Devices: Human resources officer Transfers: 4-To toilet/BSC: Min A (steadying Pt. > 75%);4-From toilet/BSC: Min A (steadying Pt. > 75%)  FIM - Banker Devices: Sliding board;Arm rests Bed/Chair Transfer: 5: Sit > Supine: Supervision (verbal cues/safety issues);3:  Bed > Chair or W/C: Mod A (lift or lower assist);3: Chair or W/C > Bed: Mod A (lift or lower assist)  FIM - Locomotion: Wheelchair Locomotion: Wheelchair: 5: Travels 150 ft or more:  maneuvers on rugs and over door sills with supervision, cueing or coaxing FIM - Locomotion: Ambulation Locomotion: Ambulation: 0: Activity did not occur (Not safe to perform this activity today)  Comprehension Comprehension Mode: Auditory Comprehension: 5-Understands basic 90% of the time/requires cueing < 10% of the time  Expression Expression Mode: Verbal Expression: 5-Expresses basic 90% of the time/requires cueing < 10% of the time.  Social Interaction Social Interaction: 5-Interacts appropriately 90% of the time - Needs monitoring or encouragement for participation or interaction.  Problem Solving Problem Solving: 5-Solves basic 90% of the time/requires cueing < 10% of the time  Memory Memory: 4-Recognizes or recalls 75 - 89% of the time/requires cueing 10 - 24% of the time  Medical Problem List and Plan:  1. 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 DVT Prophylaxis/Anticoagulation: Subcutaneous Lovenox. Monitor platelet counts any signs of bleeding  3. Pain Management: Hydrocodone as needed. Monitor with increased mobility  4. Mood/anxiety. Xanax 0.5 mg twice a day and 2 mg each bedtime. Xanax is scheduled at bedtime per pt request 5. Neuropsych: This patient is capable of making decisions on his own behalf.  6. Acute blood loss anemia. Latest hemoglobin 9.3. Followup CBC  7. Coronary artery disease with CABG. Continue aspirin and Plavix therapy. No chest pain or shortness of breath.  8. Diabetes mellitus with peripheral neuropathy. Hemoglobin A1c 7.4. Glucotrol 5 mg twice a day, Lantus insulin 5 units daily. Check blood sugars a.c. and at bedtime.  9. Hypertension. Tenormin 50 mg twice a day, Imdur 30 mg daily. Monitor with increased mobility  10. Systolic congestive heart failure. Continue Lasix. Monitor for any signs of fluid overload.  11. Hyperlipidemia. Zocor.  12. BPH. Flomax 0.8 mg each bedtime. Check PVRs x3  13. Gout. Zyloprim 300 mg daily. Monitor for any signs gout flare up  LOS  (Days) 3 A FACE TO FACE EVALUATION WAS PERFORMED  [name] 09/02/2013 8:07 AM

## 2013-09-02 NOTE — Progress Notes (Signed)
Occupational Therapy Session Notes  Patient Details  Name: Nicholas [name] MRN: [id number] Date of Birth: 1941/07/17  Today's Date: 09/02/2013  Short Term Goals: Week 1:  OT Short Term Goal 1 (Week 1): Short Term Goals = Long Term Goals  Skilled Therapeutic Interventions/Progress Updates:   Session #1 [phone] - 3155 Minutes Individual Therapy Patient with 5/10 complaints of pain, but refused any pain medication at this time. Upon entering room, patient found seated edge of bed. Patient with TED hose donned from yesterday, therapist educated patient and wife on importance of removing TED hose at night and donning on again in the am. Patient stated he already bathed and dressed with assistance from his wife. Therapist assisted patient with shoe. Patient then transferred edge of bed > w/c using slide board with steady assist; patient able to place board prior to transfer and take board out post transfer. Patient sat at sink for grooming task of washing hands, then propelled self > therapy gym. Patient transferred > therapy mat using slide board with steady assist. While on therapy mat focused on side scooting/squats, UB strengthening using 3 lb weighted bar & pushup blocks, dynamic sitting balance/tolerance/enduracen, and overall activity tolerance/endurance. Patient insisted on transferring back to w/c without using slide board; patient able to complete with steady assist (therapist holding knee). At end of session, left patient seated in gym, in w/c, waiting for PT for next therapy session.   Session #2  1135-1200 - 25 Minutes Individual Therapy No complaints of pain Upon entering room, patient found supine in bed. Patient engaged in bed mobility with supervision using bed rails and transferred edge of bed > w/c without slide board with steady assist from therapist. From here, patient re-positioned in w/c and engaged in 15 w/c pushups (5 at a time with rest breaks in-between). Therapist  discussed drop arm BSC transfers & toileting with patient and his wife. Recommending a drop arm BSC and patient perform lateral leans for peri care AND for clothing management. At end of session, left patient seated in w/c with all needed items within reach and some family present in room.    Precautions:  Precautions Precautions: Fall Precaution Comments: patient states he has been ambulating minimally for the past 8 years Restrictions Weight Bearing Restrictions: Yes LLE Weight Bearing: Non weight bearing  See FIM for current functional status  [name] 09/02/2013, 7:22 AM

## 2013-09-03 ENCOUNTER — Telehealth: Payer: Self-pay | Admitting: Vascular Surgery

## 2013-09-03 ENCOUNTER — Inpatient Hospital Stay (HOSPITAL_COMMUNITY): Payer: Medicare Other

## 2013-09-03 ENCOUNTER — Inpatient Hospital Stay (HOSPITAL_COMMUNITY): Payer: Medicare Other | Admitting: *Deleted

## 2013-09-03 ENCOUNTER — Encounter (HOSPITAL_COMMUNITY): Payer: Medicare Other | Admitting: Occupational Therapy

## 2013-09-03 LAB — GLUCOSE, CAPILLARY
Glucose-Capillary: 107 mg/dL — ABNORMAL HIGH (ref 70–99)
Glucose-Capillary: 135 mg/dL — ABNORMAL HIGH (ref 70–99)
Glucose-Capillary: 126 mg/dL — ABNORMAL HIGH (ref 70–99)
Glucose-Capillary: 154 mg/dL — ABNORMAL HIGH (ref 70–99)

## 2013-09-03 NOTE — Progress Notes (Signed)
Occupational Therapy Note  Patient Details  Name: Nicholas [name] MRN: [id number] Date of Birth: 03/10/1941 Today's Date: 09/03/2013  Time:  1030- 1130 (60 min) Pain:  3/10 left left with no movement;  Individual session  Skilled therapeutic intervention for transfers sitting balance, UE Strength and AROM,   Pt. Lying  in bed upon OT arrival after just completing PT session.  Pt. Stated he did not realized he had another session before lunch.  No schedule found in room.  Agreed to work with OT even though pt complained of being tired.     Pt. Went from supine to sit with supervision using bed rail and minimal cues for techniique.  Transferrred from bed to wc using sliding board with max verbal cues for technique and safety (minimal assist for stabilizing wc and cueing pt not to push wc away when sliding across board).. Did sit to half stand at sink with focus on technique, strength and form.  Pt had difficulty getting right foot under knee and leaning forward.  When standing halfway up, had posterior lean and unable to lean forward towards the sink.  Practiced 6 times with minimal improvement.  Pt. Reported his right knee needed surgery and was not in good shape.  Transferred wc to EOB with sliding board and minimal assist.  Pt did better going to right side than left side.   Pt. Used urinal and had spillage on shorts/underwear.  Attempted lateral leans doff pants, but ended with rolling in bed with minimal assist.  Pt kept saying my wife will do this.  Reinforced that pt has decubitus on buttocks and need to keep good skin care.  Left pt in bed with call bell in reach.    Nicholas [name], Nicholas [name] 09/03/2013, 10:00 AM

## 2013-09-03 NOTE — Progress Notes (Signed)
Reviewed and in agreement with treatment provided.  

## 2013-09-03 NOTE — Progress Notes (Addendum)
Physical Therapy Session Note  Patient Details  Name: Nicholas [name] MRN: [id number] Date of Birth: Feb 25, 1941  Today's Date: 09/03/2013 Time: [id number] Time Calculation (min): 28 min  Short Term Goals: Week 1:  PT Short Term Goal 1 (Week 1): Pt will perform all bed mobility with S PT Short Term Goal 2 (Week 1): Pt will perform bed to w/c transfer with Min A PT Short Term Goal 3 (Week 1): Pt will maintain static standing position for 1 min with Min A and use of AD  Skilled Therapeutic Interventions/Progress Updates:    Session consisted of pt performing therex of LLE (1 x 5 of SLR while also attempting to perform sidelying hip abduction and supine adductor pillow squeezes but unable to perform these exercises because of pt refusal due to c/o pain), performing supine to sit with Min A, performing a bed to w/c slideboard transfer with Min A, and performing w/c propulsion from the pt's room around the hallway and back to his room with Mod I in order to improve pt's functional independence.   Therapy Documentation Precautions:  Precautions Precautions: Fall Precaution Comments: patient states he has been ambulating minimally for the past 8 years Restrictions Weight Bearing Restrictions: Yes LLE Weight Bearing: Non weight bearing  Pain:  Pt c/o LLE pain throughout the PT session. Pt was repositioned at the end of the session in order to alleviate this pain.  See FIM for current functional status  Therapy/Group: Individual Therapy  Nicholas [name] 09/03/2013, 3:43 PM

## 2013-09-03 NOTE — Significant Event (Signed)
Hypoglycemic Event  CBG: 62  Treatment: 15 GM carbohydrate snack  Symptoms: None  Follow-up CBG: Time:2155 CBG Result:80 Possible Reasons for Event: Inadequate meal intake  Comments/MD notified: Nicholas Smilingan Angiulli    [name], Nicholas [name]  Remember to initiate Hypoglycemia Order Set & complete

## 2013-09-03 NOTE — Progress Notes (Signed)
Occupational Therapy Session Note  Patient Details  Name: Nicholas [name] MRN: [id number] Date of Birth: [birth year]  Today's Date: 09/03/2013 Time: 4098-11910735-0830 Time Calculation (min): 55 min  Short Term Goals: Week 1:  OT Short Term Goal 1 (Week 1): Short Term Goals = Long Term Goals  Skilled Therapeutic Interventions/Progress Updates:  Patient with 9/10 pain in left residual limb; RN made aware Upon entering room, patient found seated edge of bed. Patient stated he already completed bathing & dressing with wife (therapist has talked with patient about completing an ADL with him in the am, will have to go by patient and family report at this time). Patient requested to use slide board this am secondary to feeling "weak". Patient required moderate verbal cues for correct board placement and mechanics for transfer. Once on the board, patient able to transfer with supervision. Min verbal cues required for w/c management pre and post transfer. Therapist set-up drop arm BSC in room and had patient maneuver w/c [facility]; patient required moderate verbal cues for correct w/c placement. [facility] supervision using slide board. Then worked on Acupuncturistclothing management (doffing then donning pants in seated position doing lateral leans & pushups on BSC). Patient attempted to transfer back to w/c without board, but this transfer was unsafe, therefore used slide board for safe & effective transfer. During session, educated wife on safe & effective transfers using slide board (recommending slide board transfers when d/c with family at this time) and toileting tasks. RN present for medication management. From here, patient propelled self > therapy gym and completed 10 w/c pushups when in gym. Left patient seated in w/c for next therapy session.   Precautions:  Precautions Precautions: Fall Precaution Comments: patient states he has been ambulating minimally for the past 8  years Restrictions Weight Bearing Restrictions: Yes LLE Weight Bearing: Non weight bearing  See FIM for current functional status  Therapy/Group: Individual Therapy  Mee Macdonnell 09/03/2013, 8:34 AM

## 2013-09-03 NOTE — Telephone Encounter (Addendum)
Message copied by Rosalyn ChartersOUX, BONNIE A on Fri Sep 03, 2013  1:58 PM ------      Message from: McChord AFBMCCHESNEY, [place]ILYN K      Created: Thu Sep 02, 2013  2:36 PM      Regarding: Schedule                   ----- Message -----         From: Dara LordsSamantha J Rhyne, PA-C         Sent: 09/02/2013  10:40 AM           To: [facility] Charge Pool            S/p left AKA 08/27/13.  F/u with Dr. Hart RochesterLawson in 3 weeks.            Thanks,      Lelon MastSamantha ------  notified patient of post op visit with dr. Hart Rochesterlawson on 09-21-13 3:45 p.m.

## 2013-09-03 NOTE — Progress Notes (Signed)
Subjective/Complaints: Had a good night. Pleased with therapy progress so far. Woke up early this morning. Was asleep at EOB! A 12 point review of systems has been performed and if not noted above is otherwise negative.  Objective: Vital Signs: Blood pressure 113/65, pulse 64, temperature 97.8 F (36.6 C), temperature source Oral, resp. rate 20, height 5\' 10"  (1.778 m), weight 109.77 kg (242 lb), SpO2 96.00%. No results found. No results found for this basename: WBC, HGB, HCT, PLT,  in the last 72 hours No results found for this basename: NA, K, CL, CO, GLUCOSE, BUN, CREATININE, CALCIUM,  in the last 72 hours CBG (last 3)   Recent Labs  09/02/13 2155 09/02/13 2226 09/03/13 0725  GLUCAP 67* 80 107*    Wt Readings from Last 3 Encounters:  09/03/13 109.77 kg (242 lb)  08/30/13 112.8 kg (248 lb 10.9 oz)  08/30/13 112.8 kg (248 lb 10.9 oz)    Physical Exam:  Constitutional: He appears well-developed. No distress.  73 year old obese male  HENT:  Head: Normocephalic and atraumatic.  Right Ear: External ear normal.  Left Ear: External ear normal.  Eyes: Conjunctivae and EOM are normal. Pupils are equal, round, and reactive to light.  Neck: Normal range of motion. Neck supple. No JVD present. No tracheal deviation present. No thyromegaly present.  Cardiovascular: Normal rate, regular rhythm and normal heart sounds. Exam reveals no gallop and no friction rub.  No murmur heard.  Respiratory: Effort normal and breath sounds normal. No respiratory distress. He has no wheezes. He has no rales. Wearing oxygen via [place].    GI: Soft. Bowel sounds are normal. He exhibits no distension. There is no tenderness. There is no rebound.  Musculoskeletal:  Left leg edematous. Right foot with pes planus, degenerative changes.  Lymphadenopathy:  He has no cervical adenopathy.  Neurological:  Patient is appropriate.  . Wife is at bedside. Decreased PP and LT below the knee on the right leg.  Sensory lost in fingers and hans as well.  Skin: Amputation site with staples intact without drainage and appropriately tender  Psychiatric: He has a normal mood and affect. His behavior is normal. Thought content normal   Assessment/Plan: 1. Functional deficits secondary to left AKA which require 3+ hours per day of interdisciplinary therapy in a comprehensive inpatient rehab setting. Physiatrist is providing close team supervision and 24 hour management of active medical problems listed below. Physiatrist and rehab team continue to assess barriers to discharge/monitor patient progress toward functional and medical goals.  Discussed with pt the fact that he will need to work on strength and endurance if he wishes to pursue a left AK prosthesis.   FIM: FIM - Bathing Bathing Steps Patient Completed: Chest;Right Arm;Left Arm;Abdomen;Front perineal area;Right upper leg;Left upper leg Bathing: 3: Mod-Patient completes 5-7 6068f 10 parts or 50-74%  FIM - Upper Body Dressing/Undressing Upper body dressing/undressing steps patient completed: Thread/unthread right sleeve of pullover shirt/dresss;Thread/unthread left sleeve of pullover shirt/dress;Put head through opening of pull over shirt/dress;Pull shirt over trunk Upper body dressing/undressing: 5: Set-up assist to: Obtain clothing/put away FIM - Lower Body Dressing/Undressing Lower body dressing/undressing: 1: Total-Patient completed less than 25% of tasks  FIM - Toileting Toileting: 1: Two helpers  FIM - Diplomatic Services operational officerToilet Transfers Toilet Transfers Assistive Devices: Human resources officerBedside commode;Sliding board Toilet Transfers: 4-To toilet/BSC: Min A (steadying Pt. > 75%);4-From toilet/BSC: Min A (steadying Pt. > 75%)  FIM - BankerBed/Chair Transfer Bed/Chair Transfer Assistive Devices: Sliding board;Arm rests Bed/Chair Transfer: 5: Supine >  Sit: Supervision (verbal cues/safety issues);5: Sit > Supine: Supervision (verbal cues/safety issues);4: Bed > Chair or W/C: Min A  (steadying Pt. > 75%);3: Chair or W/C > Bed: Mod A (lift or lower assist)  FIM - Locomotion: Wheelchair Locomotion: Wheelchair: 6: Travels 150 ft or more, turns around, maneuvers to table, bed or toilet, negotiates 3% grade: maneuvers on rugs and over door sills independently FIM - Locomotion: Ambulation Locomotion: Ambulation: 0: Activity did not occur (Not safe to perform this activity today)  Comprehension Comprehension Mode: Auditory Comprehension: 5-Follows basic conversation/direction: With extra time/assistive device  Expression Expression Mode: Verbal Expression: 5-Expresses basic needs/ideas: With extra time/assistive device  Social Interaction Social Interaction: 5-Interacts appropriately 90% of the time - Needs monitoring or encouragement for participation or interaction.  Problem Solving Problem Solving: 5-Solves basic 90% of the time/requires cueing < 10% of the time  Memory Memory: 4-Recognizes or recalls 75 - 89% of the time/requires cueing 10 - 24% of the time  Medical Problem List and Plan:  1. 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 DVT Prophylaxis/Anticoagulation: Subcutaneous Lovenox. Monitor platelet counts any signs of bleeding  3. Pain Management: Hydrocodone as needed. Monitor with increased mobility  4. Mood/anxiety. Xanax 0.5 mg twice a day and 2 mg each bedtime. Xanax is scheduled at bedtime per pt request 5. Neuropsych: This patient is capable of making decisions on his own behalf.  6. Acute blood loss anemia. Latest hemoglobin 9.3. Followup CBC  7. Coronary artery disease with CABG. Continue aspirin and Plavix therapy. No chest pain or shortness of breath.  8. Diabetes mellitus with peripheral neuropathy. Hemoglobin A1c 7.4. Glucotrol 5 mg twice a day, Lantus insulin 5 units daily. Fair control.  9. Hypertension. Tenormin 50 mg twice a day, Imdur 30 mg daily. Monitor with increased mobility  10. Systolic congestive heart failure. Continue Lasix. Monitor for any signs of  fluid overload.  11. Hyperlipidemia. Zocor.  12. BPH. Flomax 0.8 mg each bedtime. Check PVRs x3  13. Gout. Zyloprim 300 mg daily. Monitor for any signs gout flare up  LOS (Days) 4 A FACE TO FACE EVALUATION WAS PERFORMED  Kymber Kosar T 09/03/2013 8:00 AM

## 2013-09-03 NOTE — Progress Notes (Signed)
Inpatient Diabetes Program Recommendations  AACE/ADA: New Consensus Statement on Inpatient Glycemic Control (2013)  Target Ranges:  Prepandial:   less than 140 mg/dL      Peak postprandial:   less than 180 mg/dL (1-2 hours)      Critically ill patients:  140 - 180 mg/dL  Results for Sherilyn CooterOXENDINE, Ridley D (MRN [id number]) as of 09/03/2013 13:17  Ref. Range 09/02/2013 20:44 09/02/2013 20:45 09/02/2013 21:55 09/02/2013 22:26 09/03/2013 07:25 09/03/2013 11:33  Glucose-Capillary Latest Range: 70-99 mg/dL 58 (L) 62 (L) 67 (L) 80 107 (H) 135 (H)   Inpatient Diabetes Program Recommendations Oral Agents: consider DC of Glipizide during hospitalization-risk of hypoglycemia Thank you  Piedad ClimesGina Theodore Rahrig BSN, RN,CDE Inpatient Diabetes Coordinator [phone] (team pager)

## 2013-09-03 NOTE — Progress Notes (Signed)
Physical Therapy Session Note  Patient Details  Name: Nicholas CooterJames D Liller MRN: [id number] Date of Birth: 1940/09/03  Today's Date: 09/03/2013 Time: 0830-0930 Time Calculation (min): 60 min  Short Term Goals: Week 1:  PT Short Term Goal 1 (Week 1): Pt will perform all bed mobility with S PT Short Term Goal 2 (Week 1): Pt will perform bed to w/c transfer with Min A PT Short Term Goal 3 (Week 1): Pt will maintain static standing position for 1 min with Min A and use of AD  Skilled Therapeutic Interventions/Progress Updates:    Session consisted of pt performing a w/c to mat slideboard transfer with S, a mat to w/c slideboard transfer with Min A, a w/c to bed slideboard transfer with Min A, 5 sit to stand transfers with Min A (pt unable to obtain full knee extension on the last two repetitions due to fatigue), performing 2 x 5 of LAQ with RLE in order to improve RLE strength, and w/c propulsion from the gym down the hallway and back to his room  while setting up obstacles which the pt had to avoid while using pt's own personal w/c (requiring S) in order for the pt to improve his functional independence and for us to assess how functional the pt's w/c is. Pt's endurance continues to be poor with him needing multiple rest breaks throughout the therapy session.   Therapy Documentation Precautions:  Precautions Precautions: Fall Precaution Comments: patient states he has been ambulating minimally for the past 8 years Restrictions Weight Bearing Restrictions: Yes LLE Weight Bearing: Non weight bearing  Pain:  Pt c/o "10/10" LLE pain. Pt had to take multiple rest breaks throughout the session and was repositioned in the bed at the end of the session which helped to relieve his pain.   See FIM for current functional status  Therapy/Group: Individual Therapy  Myha Arizpe 09/03/2013, 11:42 AM

## 2013-09-04 ENCOUNTER — Inpatient Hospital Stay (HOSPITAL_COMMUNITY): Payer: Medicare Other | Admitting: Occupational Therapy

## 2013-09-04 DIAGNOSIS — L98499 Non-pressure chronic ulcer of skin of other sites with unspecified severity: Secondary | ICD-10-CM

## 2013-09-04 DIAGNOSIS — I739 Peripheral vascular disease, unspecified: Secondary | ICD-10-CM

## 2013-09-04 LAB — GLUCOSE, CAPILLARY
GLUCOSE-CAPILLARY: 148 mg/dL — AB (ref 70–99)
GLUCOSE-CAPILLARY: 150 mg/dL — AB (ref 70–99)
GLUCOSE-CAPILLARY: 108 mg/dL — AB (ref 70–99)
Glucose-Capillary: 157 mg/dL — ABNORMAL HIGH (ref 70–99)

## 2013-09-04 MED ORDER — WHITE PETROLATUM GEL
Status: AC
  Administered 2013-09-04: 0.2
  Filled 2013-09-04: qty 5

## 2013-09-04 NOTE — Progress Notes (Signed)
Patient ID: Nicholas [name], male   DOB: 1941-03-27, 73 y.o.   MRN: [id number] Nicholas CooterJames D Tolosa is a 73 y.o. male 1941-03-27 [id number]  Subjective: Sleeping - "bad night" due to excessive stooling following tx for constipation.   Objective: Vital signs in last 24 hours: Temp:  [98.1 F (36.7 C)-98.4 F (36.9 C)] 98.4 F (36.9 C) (01/31 0414) Pulse Rate:  [63-72] 63 (01/31 0414) Resp:  [16-18] 16 (01/31 0414) BP: (112-143)/(55-70) 112/55 mmHg (01/31 0414) SpO2:  [93 %-96 %] 93 % (01/31 0414) Weight:  [105.1 kg (231 lb 11.3 oz)] 105.1 kg (231 lb 11.3 oz) (01/31 0414) Weight change: -4.67 kg (-10 lb 4.7 oz) Last BM Date: 08/31/13 (Sorbitol given)  Intake/Output from previous day: 01/30 0701 - 01/31 0700 In: 240 [P.O.:240] Out: -   Physical Exam General: No apparent distress   Sleeping   Wife at side Lungs: Normal effort. Lungs clear to auscultation, no crackles or wheezes. Cardiovascular: Regular rate and rhythm, no edema Abdomen: SNTND+BS Musculoskeletal:  L AKA, dressing c/d/i   Lab Results: BMET    Component Value Date/Time   NA 144 08/31/2013 0739   K 3.9 08/31/2013 0739   CL 102 08/31/2013 0739   CO2 26 08/31/2013 0739   GLUCOSE 75 08/31/2013 0739   BUN 16 08/31/2013 0739   CREATININE 1.27 08/31/2013 0739   CALCIUM 9.0 08/31/2013 0739   GFRNONAA 55* 08/31/2013 0739   GFRAA 64* 08/31/2013 0739   CBC    Component Value Date/Time   WBC 9.6 08/31/2013 0739   RBC 3.81* 08/31/2013 0739   HGB 9.9* 08/31/2013 0739   HCT 32.1* 08/31/2013 0739   PLT 338 08/31/2013 0739   MCV 84.3 08/31/2013 0739   MCH 26.0 08/31/2013 0739   MCHC 30.8 08/31/2013 0739   RDW 15.9* 08/31/2013 0739   LYMPHSABS 3.1 08/31/2013 0739   MONOABS 0.6 08/31/2013 0739   EOSABS 0.3 08/31/2013 0739   BASOSABS 0.0 08/31/2013 0739   CBG's (last 3):   Recent Labs  09/03/13 1655 09/03/13 2149 09/04/13 0716  GLUCAP 126* 154* 148*   LFT's Lab Results  Component Value Date   ALT 6 08/31/2013   AST 10 08/31/2013    ALKPHOS 68 08/31/2013   BILITOT 0.2* 08/31/2013    Studies/Results: No results found.  Medications:  I have reviewed the patient's current medications. Scheduled Medications: . allopurinol  300 mg Oral Daily  . ALPRAZolam  0.5 mg Oral Custom  . ALPRAZolam  4 mg Oral QHS  . aspirin EC  81 mg Oral Daily  . atenolol  50 mg Oral BID  . brimonidine  1 drop Both Eyes QHS   And  . timolol  1 drop Both Eyes QHS  . clopidogrel  75 mg Oral Q breakfast  . docusate sodium  100 mg Oral Daily  . enoxaparin (LOVENOX) injection  40 mg Subcutaneous Q24H  . furosemide  80 mg Oral Daily  . glipiZIDE  5 mg Oral BID AC  . insulin aspart  0-9 Units Subcutaneous TID WC  . insulin glargine  5 Units Subcutaneous Daily  . isosorbide mononitrate  30 mg Oral Daily  . latanoprost  1 drop Both Eyes QHS  . pantoprazole  40 mg Oral Daily  . potassium chloride SA  40 mEq Oral BID  . simvastatin  20 mg Oral QPM  . 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 DVT Prophylaxis/Anticoagulation: Subcutaneous Lovenox. Monitor platelet counts any signs of bleeding  3. Pain Management: Hydrocodone as needed. Monitor with increased mobility  4. Mood/anxiety. Xanax 0.5 mg twice a day and 2 mg each bedtime. Xanax is scheduled at bedtime per pt request  5. Neuropsych: This patient is capable of making decisions on his own behalf.  6. Acute blood loss anemia. Latest hemoglobin 9.3. Followup CBC  7. Coronary artery disease with CABG. Continue aspirin and Plavix therapy. No chest pain or shortness of breath.  8. Diabetes mellitus with peripheral neuropathy. Hemoglobin A1c 7.4. Glucotrol 5 mg twice a day, Lantus insulin 5 units daily. Fair control.  9.  Hypertension. Tenormin 50 mg twice a day, Imdur 30 mg daily. Monitor with increased mobility  10. Systolic congestive heart failure. Continue Lasix. Monitor for any signs of fluid overload.  11. Hyperlipidemia. Zocor.  12. BPH. Flomax 0.8 mg each bedtime. Check PVRs x3  13. Gout. Zyloprim 300 mg daily. Monitor for any signs gout flare up     Length of stay, days: 5    Valerie A. Felicity Coyer, MD 09/04/2013, 8:26 AM

## 2013-09-04 NOTE — Progress Notes (Signed)
Occupational Therapy Session Note  Patient Details  Name: Sherilyn CooterJames D Alsteen MRN: [id number] Date of Birth: 03-19-1941  Today's Date: 09/04/2013 Time: 4098-11911140-1205 Time Calculation (min): 25 min  Skilled Therapeutic Interventions/Progress Updates: Though patient and wife stated he did not sleep all night because, "he was sick from the laxative and the constant running of liquid out of him.  And he was sick to his stomach all night." Patient groggy and asked to forgo therapy but participated in bed mobility and endurance bed level.   Wife and 2 sister-in-laws present during session.  Therapy Documentation Precautions:  Precautions Precautions: Fall Precaution Comments: patient states he has been ambulating minimally for the past 8 years Restrictions Weight Bearing Restrictions: Yes LLE Weight Bearing: Non weight bearing General: General Amount of Missed OT Time (min):  (5 min)  Pain:denied   See FIM for current functional status  Therapy/Group: Individual Therapy  Bud Faceickett, Jaeleen Inzunza [facility] HospitalYeary 09/04/2013, 5:03 PM

## 2013-09-05 ENCOUNTER — Inpatient Hospital Stay (HOSPITAL_COMMUNITY): Payer: Medicare Other | Admitting: *Deleted

## 2013-09-05 LAB — GLUCOSE, CAPILLARY
GLUCOSE-CAPILLARY: 129 mg/dL — AB (ref 70–99)
Glucose-Capillary: 134 mg/dL — ABNORMAL HIGH (ref 70–99)
Glucose-Capillary: 138 mg/dL — ABNORMAL HIGH (ref 70–99)
Glucose-Capillary: 135 mg/dL — ABNORMAL HIGH (ref 70–99)

## 2013-09-05 NOTE — Progress Notes (Signed)
Occupationall Therapy Note  Patient Details  Name: Sherilyn CooterJames D Tokar MRN: [id number] Date of Birth: 03/21/1941 Today's Date: 09/05/2013  Time:.00730-0815  (45 min)  1session Pain: 3/10  leg Individual session  1st session: Engaged in bed mobility, wc mobility, UE strengthening. Pt. Went from supine to sit with supervision.  Wife assisted with sliding board transfers  And pt performed with assistance with board placement but otherwise pt was supervision.  Pt propelled  wc to gym with no assist.  Engaged in 2 # UE exercises and punching bag exercises for 100 seconds.  Propelled self back to room and ate breakfast.   2nd session:   Time:  1300-1345  (45 min) Pain:3/10 left leg Individual session  Pt. In bed upon OT arrival. Agreed to get up after encouragement.  Wife and sons present.  Asked sons if they wanted to practice with sliding board transfer.  One son, Caryn BeeKevin agreed.  Demonstrated board placement and red signs to look for when the transfer is not going right.  Caryn BeeKevin watched 2 transfers but did not return demonstration.  Pt was able to verbalize through out the transfer process step along the way.  Pt did need help getting board placed correctly and one time was going to transfer without the board, but reminded him he still needed the board for safety.  Did 10 block lpush up at end of session of EOB.  Pt. Taken back to room and left with family and call bell in reach and  safety belt on.  Humberto Seals.     Cobi Aldape J 09/05/2013, 8:18 AM

## 2013-09-05 NOTE — Progress Notes (Signed)
Noticed pt has not used urinal in over 8 hrs. Check pt and he had incont. Episode he was unaware of and staff changed briefs. Had pt attempt to void in urianl with no result. Scanned bladder for 550cc and I&O cathed for 500cc.  Discussed with pt about not fully emptying bladder and pt states this has been an ongoing issue. Plan to do PVRs with I&O cath if needed. Discussed with wife issue and she is aware. Will continue to monitor.

## 2013-09-05 NOTE — Progress Notes (Signed)
Patient ID: Nicholas [name], male   DOB: June 13, 1941, 73 y.o.   MRN: [id number] Nicholas [name] is a 74 y.o. male Jun 08, 1941 [id number]  Subjective: No new concerns - feels well, slept much better last PM.   Objective: Vital signs in last 24 hours: Temp:  [97.1 F (36.2 C)-97.9 F (36.6 C)] 97.1 F (36.2 C) (02/01 0641) Pulse Rate:  [57-67] 57 (02/01 0641) Resp:  [18-20] 18 (02/01 0645) BP: (130-132)/(60-69) 132/69 mmHg (02/01 0641) SpO2:  [97 %-99 %] 97 % (02/01 0645) Weight change:  Last BM Date: 09/04/13  Intake/Output from previous day: 01/31 0701 - 02/01 0700 In: 480 [P.O.:480] Out: -   Physical Exam General: No apparent distress     Wife at side Lungs: Normal effort. Lungs clear to auscultation, no crackles or wheezes. Cardiovascular: Regular rate and rhythm, no edema Abdomen: SNTND+BS Musculoskeletal:  L AKA, dressing c/d/i   Lab Results: BMET    Component Value Date/Time   NA 144 08/31/2013 0739   K 3.9 08/31/2013 0739   CL 102 08/31/2013 0739   CO2 26 08/31/2013 0739   GLUCOSE 75 08/31/2013 0739   BUN 16 08/31/2013 0739   CREATININE 1.27 08/31/2013 0739   CALCIUM 9.0 08/31/2013 0739   GFRNONAA 55* 08/31/2013 0739   GFRAA 64* 08/31/2013 0739   CBC    Component Value Date/Time   WBC 9.6 08/31/2013 0739   RBC 3.81* 08/31/2013 0739   HGB 9.9* 08/31/2013 0739   HCT 32.1* 08/31/2013 0739   PLT 338 08/31/2013 0739   MCV 84.3 08/31/2013 0739   MCH 26.0 08/31/2013 0739   MCHC 30.8 08/31/2013 0739   RDW 15.9* 08/31/2013 0739   LYMPHSABS 3.1 08/31/2013 0739   MONOABS 0.6 08/31/2013 0739   EOSABS 0.3 08/31/2013 0739   BASOSABS 0.0 08/31/2013 0739   CBG's (last 3):    Recent Labs  09/04/13 1703 09/04/13 2055 09/05/13 0711  GLUCAP 150* 108* 134*   LFT's Lab Results  Component Value Date   ALT 6 08/31/2013   AST 10 08/31/2013   ALKPHOS 68 08/31/2013   BILITOT 0.2* 08/31/2013    Studies/Results: No results found.  Medications:  I have reviewed the patient's current  medications. Scheduled Medications: . allopurinol  300 mg Oral Daily  . ALPRAZolam  0.5 mg Oral Custom  . ALPRAZolam  4 mg Oral QHS  . aspirin EC  81 mg Oral Daily  . atenolol  50 mg Oral BID  . brimonidine  1 drop Both Eyes QHS   And  . timolol  1 drop Both Eyes QHS  . clopidogrel  75 mg Oral Q breakfast  . docusate sodium  100 mg Oral Daily  . enoxaparin (LOVENOX) injection  40 mg Subcutaneous Q24H  . furosemide  80 mg Oral Daily  . glipiZIDE  5 mg Oral BID AC  . insulin aspart  0-9 Units Subcutaneous TID WC  . insulin glargine  5 Units Subcutaneous Daily  . isosorbide mononitrate  30 mg Oral Daily  . latanoprost  1 drop Both Eyes QHS  . pantoprazole  40 mg Oral Daily  . potassium chloride SA  40 mEq Oral BID  . simvastatin  20 mg Oral QPM  . 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 DVT Prophylaxis/Anticoagulation: Subcutaneous Lovenox. Monitor  platelet counts any signs of bleeding  3. Pain Management: Hydrocodone as needed. Monitor with increased mobility  4. Mood/anxiety. Xanax 0.5 mg twice a day and 2 mg each bedtime. Xanax is scheduled at bedtime per pt request  5. Neuropsych: This patient is capable of making decisions on his own behalf.  6. Acute blood loss anemia. Latest hemoglobin 9.3. Followup CBC  7. Coronary artery disease with CABG. Continue aspirin and Plavix therapy. No chest pain or shortness of breath.  8. Diabetes mellitus with peripheral neuropathy. Hemoglobin A1c 7.4. Glucotrol 5 mg twice a day, Lantus insulin 5 units daily. Fair control.  9. Hypertension. Tenormin 50 mg twice a day, Imdur 30 mg daily. Monitor with increased mobility  10. Systolic congestive heart failure. Continue Lasix.  Monitor for any signs of fluid overload.  11. Hyperlipidemia. Zocor.  12. BPH. Flomax 0.8 mg each bedtime. Check PVRs x3  13. Gout. Zyloprim 300 mg daily. Monitor for any signs gout flare up     Length of stay, days: 6    Valerie A. Felicity CoyerLeschber, MD 09/05/2013, 9:44 AM

## 2013-09-05 NOTE — Progress Notes (Signed)
Physical Therapy Session Note  Patient Details  Name: Nicholas [name] MRN: [id number] Date of Birth: [birth year]  Today's Date: 09/05/2013 Time:0900-1015, [id number]   Skilled Therapeutic Interventions/Progress Updates:  Session I: Patient in bed .transfered to w/c with sliding board with min A, increased time and cues needed to assure safety and proper sequencing.  W/c mobility around the unit on different surfaces with supervision, and maneuvering around obstacles and in small spaces. W/C push-ups 2 x 10 . ROM exercises to  R LE. Transfer training w/c <=>mat ,min A. Patient continues to complain of pain in L residual limb, nursing consulted about wrapping.  Session II: Patient very fatigued and refuses to get out of bed. Bed mobility training, ROM to R LE. Patient education on safety.  Therapy Documentation Precautions:  Precautions Precautions: Fall Precaution Comments: patient states he has been ambulating minimally for the past 8 years Restrictions Weight Bearing Restrictions: Yes LLE Weight Bearing: Non weight bearing Vital Signs: Therapy Vitals Temp: 97.7 F (36.5 C) Temp src: Oral Pulse Rate: 70 Resp: 18 BP: 121/53 mmHg Patient Position, if appropriate: Lying Oxygen Therapy SpO2: 97 % O2 Device: None (Room air) Pain: Pain Assessment Pain Score: Asleep Pain Type: Surgical pain Pain Location: Leg Pain Orientation: Left Pain Intervention(s): Medication (See eMAR)  See FIM for current functional status  Therapy/Group: Individual Therapy  Dorna MaiCzajkowska, Zyion Doxtater W 09/05/2013, 3:40 PM

## 2013-09-06 ENCOUNTER — Inpatient Hospital Stay (HOSPITAL_COMMUNITY): Payer: Medicare Other

## 2013-09-06 ENCOUNTER — Encounter (HOSPITAL_COMMUNITY): Payer: Medicare Other | Admitting: Occupational Therapy

## 2013-09-06 ENCOUNTER — Inpatient Hospital Stay (HOSPITAL_COMMUNITY): Payer: Medicare Other | Admitting: Occupational Therapy

## 2013-09-06 DIAGNOSIS — L98499 Non-pressure chronic ulcer of skin of other sites with unspecified severity: Secondary | ICD-10-CM

## 2013-09-06 DIAGNOSIS — I739 Peripheral vascular disease, unspecified: Secondary | ICD-10-CM

## 2013-09-06 DIAGNOSIS — D62 Acute posthemorrhagic anemia: Secondary | ICD-10-CM

## 2013-09-06 DIAGNOSIS — S88119A Complete traumatic amputation at level between knee and ankle, unspecified lower leg, initial encounter: Secondary | ICD-10-CM

## 2013-09-06 DIAGNOSIS — E1149 Type 2 diabetes mellitus with other diabetic neurological complication: Secondary | ICD-10-CM

## 2013-09-06 DIAGNOSIS — E1142 Type 2 diabetes mellitus with diabetic polyneuropathy: Secondary | ICD-10-CM

## 2013-09-06 LAB — CREATININE, SERUM
Creatinine, Ser: 1.37 mg/dL — ABNORMAL HIGH (ref 0.50–1.35)
GFR calc Af Amer: 58 mL/min — ABNORMAL LOW (ref >90)
GFR, EST NON AFRICAN AMERICAN: 50 mL/min — AB (ref >90)

## 2013-09-06 LAB — GLUCOSE, CAPILLARY
GLUCOSE-CAPILLARY: 153 mg/dL — AB (ref 70–99)
GLUCOSE-CAPILLARY: 131 mg/dL — AB (ref 70–99)
Glucose-Capillary: 116 mg/dL — ABNORMAL HIGH (ref 70–99)
Glucose-Capillary: 132 mg/dL — ABNORMAL HIGH (ref 70–99)

## 2013-09-06 NOTE — Progress Notes (Signed)
Physical Therapy Session Note  Patient Details  Name: Nicholas CooterJames D [name] MRN: [id number] Date of Birth: 04-03-1941  Today's Date: 09/06/2013 Time: [id number] Time Calculation (min): 44 min  Short Term Goals: Week 1:  PT Short Term Goal 1 (Week 1): Pt will perform all bed mobility with S PT Short Term Goal 2 (Week 1): Pt will perform bed to w/c transfer with Min A PT Short Term Goal 3 (Week 1): Pt will maintain static standing position for 1 min with Min A and use of AD  Skilled Therapeutic Interventions/Progress Updates:    Session focused on family education. Family was again instructed on w/c to bed and bed to w/c slideboard transfers and demonstrated and verbalized their understanding of this transfer technique (pt's wife and son provided S mainly in order to block the w/c and to position the slideboard under the patient). Family was also instructed on how to provide assistance when pt is performing a w/c to car and car to w/c transfer and the family verbalized and demonstrated understanding of this technique by performing it twice (pt's son was the main one involved in assisting with the transfer) with pt performing this transfer with Min A. Pt also performed w/c propulsion from the car transfer room to the pt's room with Mod I and performed bed mobility with Min A.   Therapy Documentation Precautions:  Precautions Precautions: Fall Precaution Comments: patient states he has been ambulating minimally for the past 8 years Restrictions Weight Bearing Restrictions: Yes LLE Weight Bearing: Non weight bearing    Pain:  Pt c/o LLE pain throughout therapy session. Pt took rest breaks and was repositioned at the end of the session in order to alleviate this pain  See FIM for current functional status  Therapy/Group: Individual Therapy  Mozella Rexrode 09/06/2013, 3:10 PM

## 2013-09-06 NOTE — Progress Notes (Signed)
Reviewed and in agreement with treatment provided.  

## 2013-09-06 NOTE — Progress Notes (Signed)
Subjective/Complaints: Pain under good control. No reported problems this weekends. Wants to go home sooner than projected date. A 12 point review of systems has been performed and if not noted above is otherwise negative.  Objective: Vital Signs: Blood pressure 134/68, pulse 60, temperature 98 F (36.7 C), temperature source Oral, resp. rate 18, height 5\' 10"  (1.778 m), weight 105.1 kg (231 lb 11.3 oz), SpO2 96.00%. No results found. No results found for this basename: WBC, HGB, HCT, PLT,  in the last 72 hours No results found for this basename: NA, K, CL, CO, GLUCOSE, BUN, CREATININE, CALCIUM,  in the last 72 hours CBG (last 3)   Recent Labs  09/05/13 1647 09/05/13 2042 09/06/13 0737  GLUCAP 138* 135* 116*    Wt Readings from Last 3 Encounters:  09/04/13 105.1 kg (231 lb 11.3 oz)  08/30/13 112.8 kg (248 lb 10.9 oz)  08/30/13 112.8 kg (248 lb 10.9 oz)    Physical Exam:  Constitutional: He appears well-developed. No distress.   obese male  HENT:  Head: Normocephalic and atraumatic.  Right Ear: External ear normal.  Left Ear: External ear normal.  Eyes: Conjunctivae and EOM are normal. Pupils are equal, round, and reactive to light.  Neck: Normal range of motion. Neck supple. No JVD present. No tracheal deviation present. No thyromegaly present.  Cardiovascular: Normal rate, regular rhythm and normal heart sounds. Exam reveals no gallop and no friction rub.  No murmur heard.  Respiratory: Effort normal and breath sounds normal. No respiratory distress. He has no wheezes. He has no rales. Wearing oxygen via [place].    GI: Soft. Bowel sounds are normal. He exhibits no distension. There is no tenderness. There is no rebound.  Musculoskeletal:  Left leg edematous. Right foot with pes planus, degenerative changes.  Lymphadenopathy:  He has no cervical adenopathy.  Neurological:  Patient is appropriate.  . Wife is at bedside. Decreased PP and LT below the knee on the right leg.  Sensory lost in fingers and hans as well.  Skin: Amputation site with staples intact without drainage.  Psychiatric: He has a normal mood and affect. His behavior is normal. Thought content normal   Assessment/Plan: 1. Functional deficits secondary to left AKA which require 3+ hours per day of interdisciplinary therapy in a comprehensive inpatient rehab setting. Physiatrist is providing close team supervision and 24 hour management of active medical problems listed below. Physiatrist and rehab team continue to assess barriers to discharge/monitor patient progress toward functional and medical goals.  Discussed with pt the fact that he will need to work on strength and endurance if he wishes to pursue a left AK prosthesis.   FIM: FIM - Bathing Bathing Steps Patient Completed: Chest;Right Arm;Left Arm;Abdomen;Front perineal area;Right upper leg;Left upper leg Bathing: 0: Activity did not occur  FIM - Upper Body Dressing/Undressing Upper body dressing/undressing steps patient completed: Thread/unthread right sleeve of pullover shirt/dresss;Thread/unthread left sleeve of pullover shirt/dress;Put head through opening of pull over shirt/dress;Pull shirt over trunk Upper body dressing/undressing: 0: Activity did not occur FIM - Lower Body Dressing/Undressing Lower body dressing/undressing: 0: Activity did not occur  FIM - Toileting Toileting: 0: Activity did not occur  FIM - Diplomatic Services operational officerToilet Transfers Toilet Transfers Assistive Devices: Human resources officerBedside commode;Sliding board Toilet Transfers: 0-Activity did not occur  FIM - BankerBed/Chair Transfer Bed/Chair Transfer Assistive Devices: Sliding board;Arm rests Bed/Chair Transfer: 0: Activity did not occur  FIM - Locomotion: Wheelchair Locomotion: Wheelchair: 0: Activity did not occur FIM - Locomotion: Ambulation Locomotion:  Ambulation: 0: Activity did not occur  Comprehension Comprehension Mode: Auditory Comprehension: 5-Follows basic conversation/direction:  With no assist  Expression Expression Mode: Verbal Expression: 5-Expresses basic needs/ideas: With extra time/assistive device  Social Interaction Social Interaction: 4-Interacts appropriately 75 - 89% of the time - Needs redirection for appropriate language or to initiate interaction.  Problem Solving Problem Solving: 4-Solves basic 75 - 89% of the time/requires cueing 10 - 24% of the time  Memory Memory: 5-Recognizes or recalls 90% of the time/requires cueing < 10% of the time  Medical Problem List and Plan:  1. 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 DVT Prophylaxis/Anticoagulation: Subcutaneous Lovenox. Monitor platelet counts any signs of bleeding  3. Pain Management: Hydrocodone as needed. Monitor with increased mobility  4. Mood/anxiety. Xanax 0.5 mg twice a day and 2 mg each bedtime. Xanax is scheduled at bedtime per pt request 5. Neuropsych: This patient is capable of making decisions on his own behalf.  6. Acute blood loss anemia. Latest hemoglobin 9.9 7. Coronary artery disease with CABG. Continue aspirin and Plavix therapy. No chest pain or shortness of breath.  8. Diabetes mellitus with peripheral neuropathy. Hemoglobin A1c 7.4. Glucotrol 5 mg twice a day, Lantus insulin 5 units daily. Fair control continues 9. Hypertension. Tenormin 50 mg twice a day, Imdur 30 mg daily. Monitor with increased mobility  10. Systolic congestive heart failure. Continue Lasix. Monitor for any signs of fluid overload.  11. Hyperlipidemia. Zocor.  12. BPH. Flomax 0.8 mg each bedtime. Check PVRs x3  13. Gout. Zyloprim 300 mg daily. Monitor for any signs gout flare up  LOS (Days) 7 A FACE TO FACE EVALUATION WAS PERFORMED  SWARTZ,ZACHARY T 09/06/2013 8:06 AM

## 2013-09-06 NOTE — Progress Notes (Signed)
Physical Therapy Session Note  Patient Details  Name: Nicholas [name] MRN: [id number] Date of Birth: 1941/06/13  Today's Date: 09/06/2013 Time: [id number] Time Calculation (min): 57 min  Short Term Goals: Week 1:  PT Short Term Goal 1 (Week 1): Pt will perform all bed mobility with S PT Short Term Goal 2 (Week 1): Pt will perform bed to w/c transfer with Min A PT Short Term Goal 3 (Week 1): Pt will maintain static standing position for 1 min with Min A and use of AD  Skilled Therapeutic Interventions/Progress Updates:    Session consisted of pt being instructed on and performing therex of LLE (1x10 SLR and glut squeezes) in order to improve pt's LLE strength, performing bed mobility with MIn A, performing 4 sit to stands with an elevated mat with Min A and the use of a RW, performing mat to w/c and w/c to bed slideboard transfers with S (mainly needed for slideboard and w/c setup), instructing family on proper supervision techniques when pt is performing a w/c to bed slideboard transfer with family verbalizing understanding, and performing w/c propulsion from the gym to the pt's room with Mod I (pt needing multiple rest breaks due to fatigue). Pt showed poor exercise tolerance during the session and needed multiple rest breaks throughout.    Therapy Documentation Precautions:  Precautions Precautions: Fall Precaution Comments: patient states he has been ambulating minimally for the past 8 years Restrictions Weight Bearing Restrictions: Yes LLE Weight Bearing: Non weight bearing  Pain:  Pt c/o "10/10" LLE pain and states that his pain level has gotten worse since he first got to inpatient rehab. Pt was repositioned, took rest breaks, and RN was notified in order to help alleviate this pain.  See FIM for current functional status  Therapy/Group: Individual Therapy  Monet North 09/06/2013, 11:13 AM

## 2013-09-06 NOTE — Progress Notes (Signed)
Occupational Therapy Session Notes  Patient Details  Name: Nicholas [name] MRN: [id number] Date of Birth: 1941-01-09  Today's Date: 09/06/2013  Short Term Goals: Week 1:  OT Short Term Goal 1 (Week 1): Short Term Goals = Long Term Goals  Skilled Therapeutic Interventions/Progress Updates:   Session #1 [phone]  - 1860 Minutes Individual Therapy Patient with 9/10 complaints of pain in left residual limb, patient refused pain medication at this time. Upon entering room, patient found side lying in bed with wife present in room. Therapist set-up LB dressing for patient and patient able to complete with extra time and minimal assistance(edge of bed and supine position). From here, patient transferred EOB > w/c using slide board with wife. Patient and wife required min verbal cues for correct board placement, safety for transfer, and amount of assistance needed for transfer. From here, patient completed grooming task of washing hands at sink, then propelled self > therapy gym. Patient transferred onto therapy mat with supervision and min verbal cues. While seated on mat, focused on bed mobility, lateral leans, and overall activity tolerance/endurance. During session, patient with comment "I just want to go the hell home". During session, patient also with increased fatigue and increased pain. At end of session, left patient seated on therapy mat for next therapy session.   Session #2  1030 - 1100 - 30 Minutes Individual Therapy No complaints of pain Upon entering room, patient found supine in bed. Patient engaged in bed mobility and sat edge of bed. From here, focused on education to patient's wife regarding transfer. Wife placed w/c appropriately prior to transfer, then patient worked on placing board (required min verbal cues for correct board placement), patient transferred with supervision (min verbal cues to wife for correct placement during transfer). After transfer, wife and patient donned  right leg rest independently. Patient washed hands at sink, then therapist propelled patient > therapy gym for BUE strengthening exercise using ergometer. Therapist propelled patient back to room and left patient seated in w/c beside bed with all needed items within reach and family present in room. Anticipate patient will discharge Wednesday 2/4.   Precautions:  Precautions Precautions: Fall Precaution Comments: patient states he has been ambulating minimally for the past 8 years Restrictions Weight Bearing Restrictions: Yes LLE Weight Bearing: Non weight bearing  See FIM for current functional status  [name] 09/06/2013, 7:21 AM

## 2013-09-07 ENCOUNTER — Encounter (HOSPITAL_COMMUNITY): Payer: Medicare Other | Admitting: Occupational Therapy

## 2013-09-07 ENCOUNTER — Inpatient Hospital Stay (HOSPITAL_COMMUNITY): Payer: Medicare Other

## 2013-09-07 LAB — GLUCOSE, CAPILLARY
GLUCOSE-CAPILLARY: 107 mg/dL — AB (ref 70–99)
GLUCOSE-CAPILLARY: 120 mg/dL — AB (ref 70–99)
Glucose-Capillary: 109 mg/dL — ABNORMAL HIGH (ref 70–99)
Glucose-Capillary: 149 mg/dL — ABNORMAL HIGH (ref 70–99)

## 2013-09-07 NOTE — Progress Notes (Signed)
Physical Therapy Session Note  Patient Details  Name: Nicholas [name] MRN: [id number] Date of Birth: Sep 25, 1940  Today's Date: 09/07/2013 Time: [id number] Time Calculation (min): 46 min  Short Term Goals: Week 1:  PT Short Term Goal 1 (Week 1): Pt will perform all bed mobility with S PT Short Term Goal 2 (Week 1): Pt will perform bed to w/c transfer with Min A PT Short Term Goal 3 (Week 1): Pt will maintain static standing position for 1 min with Min A and use of AD  Skilled Therapeutic Interventions/Progress Updates:    Session consisted of pt performing bed mobility (rolling right, rolling left, supine to sit, and sit to supine) with Mod I, performing a bed to w/c and w/c to bed transfer with S and the use of a slideboard, performing both a mat to w/c and w/c to mat transfer with S and the use of a slideboard, and performing sit to stand transfers x 3 with an elevated mat (required Min-Mod A with RW with pt still unable to maintain fully erect static standing position and unable to maintain static stance for more than 10 seconds). Pt needed encouragement and also needed multiple rest breaks throughout the session due to fatigue.    Therapy Documentation Precautions:  Precautions Precautions: Fall Precaution Comments: patient states he has been ambulating minimally for the past 8 years Restrictions Weight Bearing Restrictions: Yes LLE Weight Bearing: Non weight bearing  Pain:  Pt c/o LLE pain throughout therapy session. Pt took rest breaks and was repositioned in the bed at the end of the session in order to alleviate this pain.   See FIM for current functional status  Therapy/Group: Individual Therapy  Artha Chiasson 09/07/2013, 12:13 PM

## 2013-09-07 NOTE — Progress Notes (Signed)
Physical Therapy Session Note  Patient Details  Name: Nicholas [name] MRN: [id number] Date of Birth: [birth year]  Today's Date: 09/07/2013 Time: 1330-1400 Time Calculation (min): 30 min  Short Term Goals: Week 1:  PT Short Term Goal 1 (Week 1): Pt will perform all bed mobility with S PT Short Term Goal 2 (Week 1): Pt will perform bed to w/c transfer with Min A PT Short Term Goal 3 (Week 1): Pt will maintain static standing position for 1 min with Min A and use of AD  Skilled Therapeutic Interventions/Progress Updates:    Session consisted of bed mobility with Mod I and instructing pt and having him perform a home exercise program of LLE (1x10 SLR, hip adductor pillow squeezes, and s/l hip abduction was instructed on and attempted but pt c/o LLE pain during this exercise so it was not performed) and RLE (1x10 seated plantarflexion/dorsiflexion and LAQ) in order to improve pt's LE strength bilaterally. Pt verbalized and demonstrated understanding of the home exercise program and patient was left with a hard copy of all the exercises we went over.   Therapy Documentation Precautions:  Precautions Precautions: Fall Precaution Comments: patient states he has been ambulating minimally for the past 8 years Restrictions Weight Bearing Restrictions: Yes LLE Weight Bearing: Non weight bearing Pain:   Pt c/o LLE pain. Pt was repositioned in his bed in order to alleviate this pain.  See FIM for current functional status  Therapy/Group: Individual Therapy  Shavon Ashmore 09/07/2013, 2:50 PM

## 2013-09-07 NOTE — Progress Notes (Signed)
Subjective/Complaints: No new complaints. Pain increases with activity. A 12 point review of systems has been performed and if not noted above is otherwise negative.  Objective: Vital Signs: Blood pressure 121/66, pulse 54, temperature 97.9 F (36.6 C), temperature source Oral, resp. rate 18, height 5\' 10"  (1.778 m), weight 105.6 kg (232 lb 12.9 oz), SpO2 93.00%. No results found. No results found for this basename: WBC, HGB, HCT, PLT,  in the last 72 hours  Recent Labs  09/06/13 0710  CREATININE 1.37*   CBG (last 3)   Recent Labs  09/06/13 1643 09/06/13 2119 09/07/13 0742  GLUCAP 131* 132* 107*    Wt Readings from Last 3 Encounters:  09/07/13 105.6 kg (232 lb 12.9 oz)  08/30/13 112.8 kg (248 lb 10.9 oz)  08/30/13 112.8 kg (248 lb 10.9 oz)    Physical Exam:  Constitutional: He appears well-developed. No distress.   obese male  HENT:  Head: Normocephalic and atraumatic.  Right Ear: External ear normal.  Left Ear: External ear normal.  Eyes: Conjunctivae and EOM are normal. Pupils are equal, round, and reactive to light.  Neck: Normal range of motion. Neck supple. No JVD present. No tracheal deviation present. No thyromegaly present.  Cardiovascular: Normal rate, regular rhythm and normal heart sounds. Exam reveals no gallop and no friction rub.  No murmur heard.  Respiratory: Effort normal and breath sounds normal. No respiratory distress. He has no wheezes. He has no rales. Wearing oxygen via [place].    GI: Soft. Bowel sounds are normal. He exhibits no distension. There is no tenderness. There is no rebound.  Musculoskeletal:  Left leg remains somewhat edematous. Right foot with pes planus, degenerative changes.  Lymphadenopathy:  He has no cervical adenopathy.  Neurological:  Patient is appropriate.   Decreased PP and LT below the knee on the right leg. Sensory lost in fingers and hans as well.  Skin: Amputation site with staples intact without drainage.   Psychiatric: He has a normal mood and affect. His behavior is normal. Thought content normal   Assessment/Plan: 1. Functional deficits secondary to left AKA which require 3+ hours per day of interdisciplinary therapy in a comprehensive inpatient rehab setting. Physiatrist is providing close team supervision and 24 hour management of active medical problems listed below. Physiatrist and rehab team continue to assess barriers to discharge/monitor patient progress toward functional and medical goals.  Team conference today.  FIM: FIM - Bathing Bathing Steps Patient Completed: Chest;Right Arm;Left Arm;Abdomen;Front perineal area;Right upper leg;Left upper leg Bathing: 0: Activity did not occur  FIM - Upper Body Dressing/Undressing Upper body dressing/undressing steps patient completed: Thread/unthread right sleeve of pullover shirt/dresss;Thread/unthread left sleeve of pullover shirt/dress;Put head through opening of pull over shirt/dress;Pull shirt over trunk Upper body dressing/undressing: 5: Set-up assist to: Obtain clothing/put away FIM - Lower Body Dressing/Undressing Lower body dressing/undressing steps patient completed: Thread/unthread right pants leg;Thread/unthread left pants leg;Pull pants up/down Lower body dressing/undressing: 4: Min-Patient completed 75 plus % of tasks  FIM - Toileting Toileting: 0: Activity did not occur  FIM - Diplomatic Services operational officerToilet Transfers Toilet Transfers Assistive Devices: Human resources officerBedside commode;Sliding board Toilet Transfers: 0-Activity did not occur  FIM - BankerBed/Chair Transfer Bed/Chair Transfer Assistive Devices: Arm rests;Sliding board Bed/Chair Transfer: 4: Supine > Sit: Min A (steadying Pt. > 75%/lift 1 leg);4: Sit > Supine: Min A (steadying pt. > 75%/lift 1 leg);5: Bed > Chair or W/C: Supervision (verbal cues/safety issues);5: Chair or W/C > Bed: Supervision (verbal cues/safety issues)  FIM - Locomotion:  Wheelchair Locomotion: Wheelchair: 5: Travels 50 - 149 ft,  turns around, maneuvers to table, bed or toilet, negotiates 3% grade: modified independent FIM - Locomotion: Ambulation Locomotion: Ambulation: 0: Activity did not occur  Comprehension Comprehension Mode: Auditory Comprehension: 5-Follows basic conversation/direction: With extra time/assistive device  Expression Expression Mode: Verbal Expression: 5-Expresses basic needs/ideas: With extra time/assistive device  Social Interaction Social Interaction: 4-Interacts appropriately 75 - 89% of the time - Needs redirection for appropriate language or to initiate interaction.  Problem Solving Problem Solving: 4-Solves basic 75 - 89% of the time/requires cueing 10 - 24% of the time  Memory Memory: 5-Recognizes or recalls 90% of the time/requires cueing < 10% of the time  Medical Problem List and Plan:  1. 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 DVT Prophylaxis/Anticoagulation: Subcutaneous Lovenox. Monitor platelet counts any signs of bleeding  3. Pain Management: Hydrocodone as needed. generally has been controlled.  4. Mood/anxiety. Xanax 0.5 mg twice a day and 2 mg each bedtime. Xanax is scheduled at bedtime per pt request 5. Neuropsych: This patient is capable of making decisions on his own behalf.  6. Acute blood loss anemia. Latest hemoglobin 9.9 7. Coronary artery disease with CABG. Continue aspirin and Plavix therapy. No chest pain or shortness of breath.  8. Diabetes mellitus with peripheral neuropathy. Hemoglobin A1c 7.4. Glucotrol 5 mg twice a day, Lantus insulin 5 units daily. Fair control continues 9. Hypertension. Tenormin 50 mg twice a day, Imdur 30 mg daily. Monitor with increased mobility  10. Systolic congestive heart failure. Continue Lasix.  Neg since admit  11. Hyperlipidemia. Zocor.  12. BPH. Flomax 0.8 mg each bedtime.   13. Gout. Zyloprim 300 mg daily. Monitor for any signs gout flare up  LOS (Days) 8 A FACE TO FACE EVALUATION WAS PERFORMED  SWARTZ,ZACHARY T 09/07/2013 7:47 AM

## 2013-09-07 NOTE — Progress Notes (Signed)
Physical Therapy Session Note  Patient Details  Name: Nicholas CooterJames D [name] MRN: [id number] Date of Birth: 1941-02-13  Today's Date: 09/07/2013 Time: 0829-0926 Time Calculation (min): 57 min  Short Term Goals: Week 1:  PT Short Term Goal 1 (Week 1): Pt will perform all bed mobility with S PT Short Term Goal 2 (Week 1): Pt will perform bed to w/c transfer with Min A PT Short Term Goal 3 (Week 1): Pt will maintain static standing position for 1 min with Min A and use of AD  Skilled Therapeutic Interventions/Progress Updates:    Session consisted of pt performing a bed to w/c x 2 and w/c to bed transfer x 2 with S and the use of a slideboard, performing car transfers with S (mainly for setup and verbal cueing) and the use of a slideboard, performing all bed mobility with Mod I, and performing w/c propulsion from the gym to the pt's room with Mod I (slow propulsion speed) while [employer] Corporation[employer] chairs and computers in order to improve pt's independence. Pt continues to need multiple breaks throughout the session due to fatigue.   Therapy Documentation Precautions:  Precautions Precautions: Fall Precaution Comments: patient states he has been ambulating minimally for the past 8 years Restrictions Weight Bearing Restrictions: Yes LLE Weight Bearing: Non weight bearing  Pain:  Pt c/o LLE pain throughout the therapy session. Pt took rest breaks and was repositioned in the bed at the end of the session in order to alleviate this pain.   See FIM for current functional status  Therapy/Group: Individual Therapy  Rejoice Heatwole 09/07/2013, 12:05 PM

## 2013-09-07 NOTE — Progress Notes (Signed)
Physical Therapy Discharge Summary  Patient Details  Name: Nicholas [name] MRN: [id number] Date of Birth: 02-Feb-1941  Today's Date: 09/07/2013  Patient has met 6 of 6 long term goals due to improved activity tolerance, improved balance, increased strength, ability to compensate for deficits and functional use of  right upper extremity, right lower extremity, left upper extremity and left lower extremity.  Pt has progressed since beginning therapy and is now able to perform w/c mobility with Mod I,  bed mobility with Mod I, and all transfers with S with the use of a slideboard. Patient to discharge at a wheelchair level withSupervision.   Patient's family expressed concerns to social worker that they felt patient should be placed in a SNF. When SNF option was presented to patient he refused SNF placement and this option was not explored any further. Therefore, patient will be discharged home with supervision from Nicholas [name]. Family education was successfully completed with Nicholas [name] and [name] and they both demonstrated understanding of the slideboard transfer technique.    Recommendation:  Patient will benefit from ongoing skilled PT services in home health setting to continue to advance safe functional mobility, address ongoing impairments in transfers, bed mobility, dynamic sitting balance, sit to stand transfers, standing tolerance, LE strength bilaterally, and minimize fall risk.  Equipment:  J2 gel cushion for a 20 x 18 wheelchair and a 30 inch slideboard. Patient already owns a wheelchair.  Reasons for discharge: treatment goals met and discharge from hospital  Patient/family agrees with progress made and goals achieved: Yes  PT Discharge Precautions/Restrictions Restrictions Weight Bearing Restrictions: Yes LLE Weight Bearing: Non weight bearing  Cognition Overall Cognitive Status: Within Functional Limits for tasks assessed Arousal/Alertness: Awake/alert Orientation Level: Oriented  X4 Safety/Judgment: Appears intact Sensation Sensation Light Touch: Impaired Detail Light Touch Impaired Details: Impaired RLE (decreased light touch sensation on dorsal surface of foot) Motor  Motor Motor: Abnormal postural alignment and control (poor standing posture)   Trunk/Postural Assessment  Cervical Assessment Cervical Assessment: Within Functional Limits Thoracic Assessment Thoracic Assessment: Within Functional Limits Lumbar Assessment Lumbar Assessment: Within Functional Limits Postural Control Postural Control: Deficits on evaluation (poor static standing posture)  Balance Balance Balance Assessed: Yes Static Sitting Balance Static Sitting - Level of Assistance: 6: Modified independent (Device/Increase time) Dynamic Sitting Balance Dynamic Sitting - Level of Assistance: 5: Stand by assistance Static Standing Balance Static Standing - Level of Assistance: 4: Min assist (with use of a RW) Extremity Assessment      RLE Assessment RLE Assessment: Exceptions to [facility] RLE Strength RLE Overall Strength Comments: MMT grades: Hip flexion 3-/5, Knee extension 4-/5, Ankle dorsiflexion and platarfflexion 4/5 LLE Assessment LLE Assessment: Exceptions to [facility] LLE Strength LLE Overall Strength Comments: MMT grades: Hip flexion 4-/5, Hip abduction 3-/5  See FIM for current functional status  [name] 09/07/2013, 12:30 PM

## 2013-09-07 NOTE — Progress Notes (Signed)
Reviewed and in agreement with treatment provided.  

## 2013-09-07 NOTE — Progress Notes (Signed)
Occupational Therapy Session Note  Patient Details  Name: Sherilyn CooterJames D Stayer MRN: [id number] Date of Birth: 05-09-1941  Today's Date: 09/07/2013 Time: [id number] Time Calculation (min): 55 min  Short Term Goals: Week 1:  OT Short Term Goal 1 (Week 1): Short Term Goals = Long Term Goals  Skilled Therapeutic Interventions/Progress Updates:  Upon entering room, patient found supine in bed with wife present post dressing. Patient and wife performed bed mobility and EOB > w/c transfer using slide board (together they were able to complete all independently); patient required supervision from wife for verbal cues prior to transfer. Patient washed hands seated at sink, then propelled self > ADL apartment. Engaged patient in tub/shower transfer using tub transfer bench. Patient unable to get w/c into bathroom at home at this time, but felt it important to introduce tub transfer bench to patient and have him practice transfer; min assist for transfer. Patient then propelled self > gym and transferred onto mat with supervision; min verbal cues. Sat on mat and engaged in dressing activity using theraband. At end of session, left patient seated on mat with PT present for next therapy session.   Precautions:  Precautions Precautions: Fall Precaution Comments: patient states he has been ambulating minimally for the past 8 years Restrictions Weight Bearing Restrictions: Yes LLE Weight Bearing: Non weight bearing  See FIM for current functional status  Therapy/Group: Individual Therapy  Hadia Minier 09/07/2013, 8:56 AM

## 2013-09-07 NOTE — Progress Notes (Signed)
Occupational Therapy Discharge Summary  Patient Details  Name: Nicholas [name] MRN: [id number] Date of Birth: August 31, 1940  Today's Date: 09/07/2013  Patient has met 80 of 11 long term goals due to improved activity tolerance, improved balance, postural control, ability to compensate for deficits, improved attention, improved awareness and improved coordination.  Patient to discharge at overall supervision > minimal assistance level.  Patient's care partner is independent to provide the necessary physical and supervision assistance at discharge. Patient requires min assist for LB dressing, patient not receptive to learning new techniques for dressing during this admission; he states he wants his wife to help. Patient requires supervision for functional slide board transfers at this time, wife is independent to provide the necessary verbal cues prn for transfers. Patient requires moderate assistance for toileting (needs assistance with clothing management post toileting); wife able to assist with this. Patient is performing bed baths at this time, he is unable to maneuver w/c in/out of bathroom, wife and patient are aware of bed baths only secondary to patient unable to ambulate into bathroom at this time.    Clinically recommend patient discharge to SNF, but patient refused after multiple discussions with SW. Wife is able to verbally & physically demonstrate understanding of providing physical assistance and supervision as needed. Concerned with wife assisting patient with his long term continuum of care.   Reasons goals not met: n/a, all goals met at this time.  Recommendation:  Patient will benefit from ongoing skilled OT services in home health setting to continue to advance functional skills in the area of BADL and Reduce care partner burden.  Equipment: drop arm BSC, no tub bench recommended at this time (patient unable to get in/out of bathroom with w/c).  Reasons for discharge: treatment goals  met and discharge from hospital  Patient/family agrees with progress made and goals achieved: Yes  Precautions/Restrictions  Precautions Precautions: Fall Precaution Comments: patient states he has been ambulating minimally for the past 8 years Restrictions Weight Bearing Restrictions: Yes LLE Weight Bearing: Non weight bearing  Vision/Perception  Vision - History Baseline Vision: Wears glasses only for reading Visual History: Cataracts (surgery for bilateral cataracts in the past) Patient Visual Report: No change from baseline Perception Perception: Within Functional Limits Praxis Praxis: Intact    Cognition Overall Cognitive Status: Within Functional Limits for tasks assessed Arousal/Alertness: Awake/alert Orientation Level: Oriented X4 Attention: Selective Selective Attention: Appears intact Memory: Impaired (requires min verbal cues for memory during slide board transfers) Awareness: Appears intact Problem Solving: Impaired Safety/Judgment: Appears intact  Sensation Sensation Additional Comments: Patient reports some numbness and tingling in fingers and toes Coordination Gross Motor Movements are Fluid and Coordinated: Yes Fine Motor Movements are Fluid and Coordinated: Yes  Motor  Motor Motor: Abnormal postural alignment and control (poor standing posture)  Trunk/Postural Assessment  Cervical Assessment Cervical Assessment: Within Functional Limits Thoracic Assessment Thoracic Assessment: Within Functional Limits Lumbar Assessment Lumbar Assessment: Within Functional Limits Postural Control Postural Control: Deficits on evaluation (poor static standing posture)   Balance Balance Balance Assessed: Yes Static Sitting Balance Static Sitting - Level of Assistance: 6: Modified independent (Device/Increase time) Dynamic Sitting Balance Dynamic Sitting - Level of Assistance: 5: Stand by assistance Static Standing Balance Static Standing - Level of  Assistance: 4: Min assist (with use of a RW)  Extremity/Trunk Assessment RUE Assessment RUE Assessment: Within Functional Limits LUE Assessment LUE Assessment: Within Functional Limits  See FIM for current functional status  Nicholas [name] 09/07/2013, 2:11 PM

## 2013-09-07 NOTE — Progress Notes (Signed)
Reviewed and in agreement with discharge assessment provided.  

## 2013-09-08 LAB — GLUCOSE, CAPILLARY
GLUCOSE-CAPILLARY: 117 mg/dL — AB (ref 70–99)
GLUCOSE-CAPILLARY: 151 mg/dL — AB (ref 70–99)

## 2013-09-08 MED ORDER — CLOPIDOGREL BISULFATE 75 MG PO TABS: 75.0000 mg | ORAL_TABLET | Freq: Every morning | ORAL | Status: DC

## 2013-09-08 MED ORDER — DICLOFENAC SODIUM 1 % TD GEL: 1.0000 "application " | Freq: Four times a day (QID) | TRANSDERMAL | Status: AC | PRN

## 2013-09-08 MED ORDER — ISOSORBIDE MONONITRATE ER 30 MG PO TB24: 30.0000 mg | ORAL_TABLET | Freq: Every morning | ORAL | Status: DC

## 2013-09-08 MED ORDER — TAMSULOSIN HCL 0.4 MG PO CAPS: 0.8000 mg | ORAL_CAPSULE | Freq: Every day | ORAL | Status: AC

## 2013-09-08 MED ORDER — FUROSEMIDE 80 MG PO TABS: 80.0000 mg | ORAL_TABLET | Freq: Every morning | ORAL | Status: DC

## 2013-09-08 MED ORDER — ALLOPURINOL 300 MG PO TABS: 300.0000 mg | ORAL_TABLET | Freq: Every morning | ORAL | Status: AC

## 2013-09-08 MED ORDER — POTASSIUM CHLORIDE CRYS ER 20 MEQ PO TBCR: 40.0000 meq | EXTENDED_RELEASE_TABLET | Freq: Two times a day (BID) | ORAL | Status: DC

## 2013-09-08 MED ORDER — OMEPRAZOLE 20 MG PO CPDR: 20.0000 mg | DELAYED_RELEASE_CAPSULE | Freq: Two times a day (BID) | ORAL | Status: DC

## 2013-09-08 MED ORDER — HYDROCODONE-ACETAMINOPHEN 5-325 MG PO TABS
1.0000 | ORAL_TABLET | ORAL | Status: DC | PRN
Start: 2013-09-08 — End: 2014-03-21

## 2013-09-08 MED ORDER — SIMVASTATIN 20 MG PO TABS: 20.0000 mg | ORAL_TABLET | Freq: Every evening | ORAL | Status: AC

## 2013-09-08 MED ORDER — NITROGLYCERIN 0.4 MG SL SUBL: 0.4000 mg | SUBLINGUAL_TABLET | SUBLINGUAL | Status: AC | PRN

## 2013-09-08 MED ORDER — ATENOLOL 50 MG PO TABS
50.0000 mg | ORAL_TABLET | Freq: Two times a day (BID) | ORAL | Status: DC
Start: 2013-09-08 — End: 2014-07-25

## 2013-09-08 MED ORDER — INSULIN GLARGINE 100 UNIT/ML ~~LOC~~ SOLN: 5.0000 [IU] | Freq: Every day | SUBCUTANEOUS | Status: DC

## 2013-09-08 MED ORDER — GLIPIZIDE 5 MG PO TABS: 5.0000 mg | ORAL_TABLET | Freq: Two times a day (BID) | ORAL | Status: DC

## 2013-09-08 MED ORDER — ASPIRIN 81 MG PO TBEC: 81.0000 mg | DELAYED_RELEASE_TABLET | Freq: Every day | ORAL | Status: DC

## 2013-09-08 NOTE — Progress Notes (Signed)
Pt discharged with family to home . Discharge instruction was given by Harvel Ricksan Anguilli, PA.

## 2013-09-08 NOTE — Progress Notes (Signed)
Subjective/Complaints: Up at bedside'. "i am ready to go" A 12 point review of systems has been performed and if not noted above is otherwise negative.  Objective: Vital Signs: Blood pressure 126/51, pulse 63, temperature 97.3 F (36.3 C), temperature source Oral, resp. rate 18, height 5\' 10"  (1.778 m), weight 105.1 kg (231 lb 11.3 oz), SpO2 94.00%. No results found. No results found for this basename: WBC, HGB, HCT, PLT,  in the last 72 hours  Recent Labs  09/06/13 0710  CREATININE 1.37*   CBG (last 3)   Recent Labs  09/07/13 1700 09/07/13 2047 09/08/13 0748  GLUCAP 149* 120* 117*    Wt Readings from Last 3 Encounters:  09/08/13 105.1 kg (231 lb 11.3 oz)  08/30/13 112.8 kg (248 lb 10.9 oz)  08/30/13 112.8 kg (248 lb 10.9 oz)    Physical Exam:  Constitutional: He appears well-developed. No distress.   obese male  HENT:  Head: Normocephalic and atraumatic.  Right Ear: External ear normal.  Left Ear: External ear normal.  Eyes: Conjunctivae and EOM are normal. Pupils are equal, round, and reactive to light.  Neck: Normal range of motion. Neck supple. No JVD present. No tracheal deviation present. No thyromegaly present.  Cardiovascular: Normal rate, regular rhythm and normal heart sounds. Exam reveals no gallop and no friction rub.  No murmur heard.  Respiratory: Effort normal and breath sounds normal. No respiratory distress. He has no wheezes. He has no rales. Wearing oxygen via [place].    GI: Soft. Bowel sounds are normal. He exhibits no distension. There is no tenderness. There is no rebound.  Musculoskeletal:  Left leg with good shape, still a little swollen. Right foot with pes planus, degenerative changes.  Lymphadenopathy:  He has no cervical adenopathy.  Neurological:  Patient is appropriate.   Decreased PP and LT below the knee on the right leg. Sensory lost in fingers and hans as well.  Skin: Amputation site with staples intact without drainage.   Psychiatric: He has a normal mood and affect. His behavior is normal. Thought content normal   Assessment/Plan: 1. Functional deficits secondary to left AKA which require 3+ hours per day of interdisciplinary therapy in a comprehensive inpatient rehab setting. Physiatrist is providing close team supervision and 24 hour management of active medical problems listed below. Physiatrist and rehab team continue to assess barriers to discharge/monitor patient progress toward functional and medical goals.  Home today. Follow up with me in about a month for prosthetic eval. Sees surgeon in next week or so. HH F/U  FIM: FIM - Bathing Bathing Steps Patient Completed: Chest;Right Arm;Left Arm;Abdomen;Front perineal area;Right upper leg;Left upper leg;Buttocks;Right lower leg (including foot) Bathing: 5: Supervision: Safety issues/verbal cues  FIM - Upper Body Dressing/Undressing Upper body dressing/undressing steps patient completed: Thread/unthread right sleeve of pullover shirt/dresss;Thread/unthread left sleeve of pullover shirt/dress;Put head through opening of pull over shirt/dress;Pull shirt over trunk Upper body dressing/undressing: 5: Set-up assist to: Obtain clothing/put away FIM - Lower Body Dressing/Undressing Lower body dressing/undressing steps patient completed: Thread/unthread right pants leg;Thread/unthread left pants leg;Pull pants up/down;Don/Doff right shoe Lower body dressing/undressing: 4: Min-Patient completed 75 plus % of tasks  FIM - Toileting Toileting steps completed by patient: Adjust clothing prior to toileting;Performs perineal hygiene Toileting: 3: Mod-Patient completed 2 of 3 steps  FIM - Diplomatic Services operational officerToilet Transfers Toilet Transfers Assistive Devices: Human resources officerBedside commode;Sliding board Toilet Transfers: 5-To toilet/BSC: Supervision (verbal cues/safety issues);5-From toilet/BSC: Supervision (verbal cues/safety issues)  FIM - BankerBed/Chair Transfer Bed/Chair Transfer Assistive  Devices:  Sliding board;Arm rests Bed/Chair Transfer: 6: Supine > Sit: No assist;6: Sit > Supine: No assist;5: Bed > Chair or W/C: Supervision (verbal cues/safety issues);5: Chair or W/C > Bed: Supervision (verbal cues/safety issues)  FIM - Locomotion: Wheelchair Locomotion: Wheelchair: 6: Travels 150 ft or more, turns around, maneuvers to table, bed or toilet, negotiates 3% grade: maneuvers on rugs and over door sills independently FIM - Locomotion: Ambulation Locomotion: Ambulation: 0: Activity did not occur  Comprehension Comprehension Mode: Auditory Comprehension: 5-Understands complex 90% of the time/Cues < 10% of the time  Expression Expression Mode: Verbal Expression: 6-Expresses complex ideas: With extra time/assistive device  Social Interaction Social Interaction: 6-Interacts appropriately with others with medication or extra time (anti-anxiety, antidepressant).  Problem Solving Problem Solving: 5-Solves basic 90% of the time/requires cueing < 10% of the time  Memory Memory: 5-Recognizes or recalls 90% of the time/requires cueing < 10% of the time  Medical Problem List and Plan:  1. 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 DVT Prophylaxis/Anticoagulation: Subcutaneous Lovenox. Monitor platelet counts any signs of bleeding  3. Pain Management: Hydrocodone as needed. generally has been controlled.  4. Mood/anxiety. Xanax 0.5 mg twice a day and 2 mg each bedtime. Xanax is scheduled at bedtime per pt request 5. Neuropsych: This patient is capable of making decisions on his own behalf.  6. Acute blood loss anemia. Latest hemoglobin 9.9 7. Coronary artery disease with CABG. Continue aspirin and Plavix therapy. No chest pain or shortness of breath.  8. Diabetes mellitus with peripheral neuropathy. Hemoglobin A1c 7.4. Glucotrol 5 mg twice a day, Lantus insulin 5 units daily. Fair control   9. Hypertension. Tenormin 50 mg twice a day, Imdur 30 mg daily. Monitor with increased mobility  10. Systolic  congestive heart failure. Continue Lasix.  Neg since admit  11. Hyperlipidemia. Zocor.  12. BPH. Flomax 0.8 mg each bedtime.   13. Gout. Zyloprim 300 mg daily. Monitor for any signs gout flare up  LOS (Days) 9 A FACE TO FACE EVALUATION WAS PERFORMED  SWARTZ,ZACHARY T 09/08/2013 8:16 AM

## 2013-09-08 NOTE — Progress Notes (Signed)
Social Work Discharge Note  The overall goal for the admission was met for:   Discharge location: Yes - home with family to provide 24/7 support  Length of Stay: Yes - 9 days  Discharge activity level: Yes - supervision to minimal assist w/c level  Home/community participation: Yes  Services provided included: MD, RD, PT, OT, RN, TR, Pharmacy and [place]: Medicare and Private Insurance: [place]  Follow-up services arranged: Home Health: Therapist, sports, PT, OT, SW via [place], DME: 20x18 [place] Dash 2 cushion for w/c, drop arm commode and 30" transfer board via [place] and Patient/Family has no preference for HH/DME agencies  Comments (or additional information):  Patient/Family verbalized understanding of follow-up arrangements: Yes  Individual responsible for coordination of the follow-up plan: patient  Confirmed correct DME delivered: Dakota Vanwart 09/08/2013    Shaun Runyon

## 2013-09-08 NOTE — Discharge Summary (Signed)
Discharge summary job [id number]

## 2013-09-08 NOTE — Discharge Instructions (Signed)
Inpatient Rehab Discharge Instructions  Nicholas SkyeJames D [name] Discharge date and time: No discharge date for patient encounter.   Activities/Precautions/ Functional Status: Activity: activity as tolerated Diet: diabetic diet Wound Care: keep wound clean and dry Functional status:  ___ No restrictions     ___ Walk up steps independently __x_ 24/7 supervision/assistance   ___ Walk up steps with assistance ___ Intermittent supervision/assistance  ___ Bathe/dress independently ___ Walk with walker     ___ Bathe/dress with assistance ___ Walk Independently    ___ Shower independently ___ Walk with assistance    ___ Shower with assistance ___ No alcohol     ___ Return to work/school ________   COMMUNITY REFERRALS UPON DISCHARGE:    Home Health:   PT     OT    RN       SW                    [facility] [phone]:[phone]   Medical Equipment/Items Ordered: cushion for wheelchair, drop arm commode and transfer board                                                     Agency/Supplier: [facility] @ [phone]   GENERAL COMMUNITY RESOURCES FOR PATIENT/FAMILY: Support Groups: Amputee Support Group      Special Instructions:  Followup 2 weeks with Dr. Hart RochesterLawson for removal of staples  My questions have been answered and I understand these instructions. I will adhere to these goals and the provided educational materials after my discharge from the hospital.  Patient/Caregiver Signature _______________________________ Date __________  Clinician Signature _______________________________________ Date __________  Please bring this form and your medication list with you to all your follow-up doctor's appointments.

## 2013-09-08 NOTE — Progress Notes (Signed)
Social Work Patient ID: Nicholas [name], male   DOB: Jun 13, 1941, 73 y.o.   MRN: [id number]  Met with pt and family over the past two days to discuss family concerns and team conference info.  On Monday, son very adamant that he is concerned for pt's behavior once he returns home stating, "He's not going to do anything...just gonna keep cussing all of [place] out and it's too much for her (pt's wife)...".  Have explained to son and wife that, per their concerns told to me last week, I did discuss with pt the possibility of d/c to SNF as his care needs might be too great for his wife and pt adamantly refuses.  Pt insists that his family can provide all the assist he needs.  Stressed to son and wife that they will need to tell pt directly themselves what their concerns are.  Wife sits very passively by and states "... It won't do no good." Yesterday, in follow up from team conference, son and wife in room as I openly addressed all concerns expressed by family and, again, offered option of SNF.  Pt continues to refuse. I looked toward family to speak up about their concerns and the son simply asks, "What time will we need to pick him up tomorrow?"  (D/c date moved up)  Pt and family aware that pt is reaching supervision to minimal assist goals. Pt insists that his family will provide this and they sit passively at his bedside without contradiction.  Per therapies, wife has been performing all of pt's b/d assistance here at hospital and has done fine.    D/c home today with [place] follow up.  Have requested addition of HHSW to assess home situation and make any needed community referrals if there are concerns.    Kena Limon, LCSW

## 2013-09-08 NOTE — Patient Care Conference (Signed)
Inpatient RehabilitationTeam Conference and Plan of Care Update Date: 09/07/2013   Time: 2:10 PM    Patient Name: Nicholas [name]      Medical Record Number: [id number]  Date of Birth: [birth year] Sex: Male         Room/Bed: 4W06C/4W06C-01 Payor Info: Payor: MEDICARE / Plan: MEDICARE PART A AND B / Product Type: *No Product type* /    Admitting Diagnosis: L AKA  Admit Date/Time:  08/30/2013  4:28 PM Admission Comments: No comment available   Primary Diagnosis:  <principal problem not specified> Principal Problem: <principal problem not specified>  Patient Active Problem List   Diagnosis Date Noted  . S/P AKA (above knee amputation) 08/30/2013  . Sacral decubitus ulcer, stage IV 08/27/2013  . MRSA (methicillin resistant Staphylococcus aureus) infection 08/26/2013  . History of stroke   . Cellulitis of foot 08/24/2013  . Type 2 diabetes mellitus with diabetic nephropathy 06/03/2013  . Pressure ulcer stage II 06/03/2013  . Chronic respiratory failure 05/31/2013  . Chronic diastolic heart failure   . 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 DNR (do not resuscitate) 12/11/2011  . Morbid obesity   . CAD (coronary artery disease), native coronary artery   . Diabetic foot ulcer 08/28/2011  . PAD (peripheral artery disease) 07/29/2011  . Anxiety 07/29/2011  . BPH (benign prostatic hyperplasia) 07/29/2011  . Type 2 diabetes mellitus with vascular disease   . Hypertensive heart disease without CHF   . Hyperlipidemia     Expected Discharge Date: Expected Discharge Date: 09/08/13  Team Members Present: Physician leading conference: Dr. Faith [name] Social Worker Present: Nicholas Jupiter, LCSW Nurse Present: Other (comment) Nicholas Bob, RN) PT Present: Nicholas [name], Nicholas [name], PT OT Present: Nicholas [name], Nicholas [name], OT PPS Coordinator present : Nicholas Duck, RN, CRRN;Nicholas [name], PT     Current Status/Progress Goal  Weekly Team Focus  Medical   wound healing nicely. pain better  see prior  see prior   Bowel/Bladder   Continent of bowel and bladder with occasional incontinent episodes. Monitoring PVR. No in/out cath as of yet. Scanned at 2125 for 133, 0357 for 300, 0550 for 98  Remain continent of B&B independently  Keep urinal close by and encourage use of toilet   Swallow/Nutrition/ Hydration             ADL's   overall supervision > min assist w/c level  overall supervision > min assist w/c level  D/C planning and family education   Mobility   Mod I w/c mobility, S basic transfers with slideboard, S-Min A bed mobility  Mod I w/c mobility, S basic transfers with slideboard, mod I bed mobility  w/c mobility, transfers, bed mobility, d/c planning & family education   Communication             Safety/Cognition/ Behavioral Observations            Pain   Vicodin 5/325 q 4 hrs; Tyl 325/650 q 4 hrs. Vicodin adm at 2140 for L BKA  Managed pain at or below 2  Assess and monitor pain q shift; provide appropriate intervention   Skin   Cellulitis to RLE (foot), Stage IV to sacrum present prior to admission-chronic, foam in place. Staples (43) to left AKA, slightly bruise, CDI, OTA  No new skin breaksown and free from infection  Assess skin q shift; change foam q 3 days and PRN. Turn q 2 hrs....    Rehab Goals Patient  on target to meet rehab goals: Yes *See Care Plan and progress notes for long and short-term goals.  Barriers to Discharge: see prior, home set up    Possible Resolutions to Barriers:  family ed, sw eval.     Discharge Planning/Teaching Needs:  home with wife who can only provide supervision;  have added HHSW to follow up to monitor situation      Team Discussion: Meeting goals and team feels can move up d/c.  Family still with concerns about managing at home given pt's behavior towards them "for years".  SW has offered option of SNF and pt refusing.  Family aware this is his right.   Wife has performed all pt's self care very competently here.  Revisions to Treatment Plan:  Change in d/c date   Continued Need for Acute Rehabilitation Level of Care: The patient requires daily medical management by a physician with specialized training in physical medicine and rehabilitation for the following conditions: Daily direction of a multidisciplinary physical rehabilitation program to ensure safe treatment while eliciting the highest outcome that is of practical value to the patient.: Yes Daily medical management of patient stability for increased activity during participation in an intensive rehabilitation regime.: Yes Daily analysis of laboratory values and/or radiology reports with any subsequent need for medication adjustment of medical intervention for : Neurological problems;Post surgical problems  Nicholas [name] 09/08/2013, 9:49 AM

## 2013-09-09 NOTE — Discharge Summary (Signed)
NAME:  Francesca JewettOXENDINE, Kipp              ACCOUNT NO.:  [id number]  MEDICAL RECORD NO.:  [id number]  LOCATION:  4W06C                        FACILITY:  [facility]  PHYSICIAN:  Faith RogueZachary Swartz, MD     DATE OF BIRTH:  12-31-1940  DATE OF ADMISSION:  08/30/2013 DATE OF DISCHARGE:  09/08/2013                              DISCHARGE SUMMARY   DISCHARGE DIAGNOSES: 1. 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 2. Subcutaneous Lovenox for DVT prophylaxis. 3. Pain management. 4. Anxiety. 5. Acute blood loss anemia. 6. Coronary artery disease with coronary artery bypass graft. 7. Diabetes mellitus with peripheral neuropathy. 8. Hypertension. 9. Systolic congestive heart failure. 10.Hyperlipidemia. 11.Benign prostatic hypertrophy. 12.Gout.  HISTORY OF PRESENT ILLNESS:  This is a 73 year old right-handed male with history of diabetes mellitus, coronary artery disease, and peripheral vascular disease with revascularization procedures in the past.  The patient used a walker prior to admission.  Admitted on August 24, 2013, with nonhealing left foot ulcer.  Left foot films showed soft tissue ulceration of the heel region.  No osteomyelitis.  No change with conservative care and limb was not felt to be salvageable. The patient underwent left above-knee amputation on August 27, 2013, per Dr. Hart RochesterLawson.  Postoperative pain control.  Acute blood loss anemia 9.3 and monitored.  MRSA and PCR screening positive with contact precautions.  Physical and occupational therapy on going.  The patient was admitted for comprehensive rehab program.  PAST MEDICAL HISTORY:  See discharge diagnoses.  SOCIAL HISTORY:  Lives with spouse.  FUNCTIONAL HISTORY:  Prior to admission used a walker.  FUNCTIONAL STATUS:  Upon admission to rehab services, min-to-moderate assist for functional mobility; max assist lower body bathing.  PHYSICAL EXAMINATION:  VITAL SIGNS:  Blood pressure 129/54, pulse 57, temperature 97.9,  respirations 20. GENERAL:  This was an alert male, no acute distress.  He was appropriate, followed basic commands. LUNGS:  Clear to auscultation. CARDIAC:  Regular rate and rhythm. ABDOMEN:  Soft, nontender.  Good bowel sounds.  Amputation site with staples intact without drainage.  REHABILITATION/HOSPITAL COURSE:  The patient was admitted to inpatient rehab services with therapies initiated on a 3-hour daily basis consisting of physical therapy, occupational therapy, and rehabilitation nursing.  The following issues were addressed during the patient's rehabilitation stay.  Pertaining to Mr. Ramey's left above-knee amputation on August 27, 2013, surgical site healing nicely.  He would follow up with Vascular Surgery.  He remained on subcutaneous Lovenox for DVT prophylaxis.  No bleeding episodes.  Pain management with the use of hydrocodone and good results.  He did have a history of coronary artery disease with CABG.  He remained on aspirin and Plavix therapy. No chest pain or shortness of breath.  Diabetes mellitus and peripheral neuropathy, hemoglobin A1c.  He remained on Lantus insulin as well as Glucotrol.  He did have a history of systolic congestive heart failure. He continued on Lasix therapy as prior to admission.  He exhibited no signs of fluid overload.  Benign prostatic hypertrophy, maintained on Flomax.  No voiding episodes.  No voiding difficulty.  The patient received weekly collaborative interdisciplinary team conferences to discuss estimated length of stay, family teaching, and any barriers to  his discharge.  Bed mobility with modified independence.  Min assist upper body and activities of daily living; total assist lower body; max assist transfers; minimal assist wheelchair mobility and bed mobility. Overall his strength and endurance did continue to improve.  Ongoing therapies would be dictated as per rehab services.  Discharge took place to September 08, 2013.  DISCHARGE MEDICATIONS:  Tylenol 650 mg p.o. every 4 hours as needed mild pain, allopurinol 300 mg p.o. daily, Xanax 0.5 mg p.o. b.i.d. and 4 mg p.o. at bedtime, aspirin 81 mg p.o. daily, Tenormin 50 mg p.o. b.i.d., Plavix 75 mg p.o. daily, Alphagan ophthalmic solution 0.2% 1 drop both eyes at bedtime, and Timoptic ophthalmic solution 0.5% 1 drop both eyes at bedtime, Lasix 80 mg p.o. daily, Glucotrol 5 mg p.o. b.i.d., hydrocodone 1 tablet every 4 hours as needed moderate pain, Lantus insulin 5 units subcutaneously daily, Imdur 30 mg p.o. daily, Xalatan ophthalmic solution 0.05% one drop both eyes at bedtime, nitroglycerin 0.4 mg sublingual as needed chest pain, Protonix 40 mg p.o. daily, potassium chloride 40 mEq p.o. b.i.d., Zocor 20 mg p.o. daily, and Flomax 0.8 mg p.o. at bedtime.  DIET:  Diabetic 2000 calorie diet.  SPECIAL INSTRUCTIONS:  The patient would follow up with vascular surgery, Dr. Hart Rochester in 2 weeks.  Call for appointment, Dr. Faith Rogue, the outpatient rehab service office as directed.  Dr. Marden Noble medical management and ongoing therapies dictated as per rehab services.     Mariam Dollar, P.A.   ______________________________ Faith Rogue, MD    DA/MEDQ  D:  09/08/2013  T:  09/09/2013  Job:  [id number]  cc:   Quita Skye. Hart Rochester, M.D. Pearla Dubonnet, M.D.

## 2013-09-21 ENCOUNTER — Encounter: Payer: Medicare Other | Admitting: Vascular Surgery

## 2013-09-27 ENCOUNTER — Encounter: Payer: Self-pay | Admitting: Vascular Surgery

## 2013-09-28 ENCOUNTER — Ambulatory Visit (INDEPENDENT_AMBULATORY_CARE_PROVIDER_SITE_OTHER): Payer: Self-pay | Admitting: Vascular Surgery

## 2013-09-28 ENCOUNTER — Encounter: Payer: Self-pay | Admitting: Vascular Surgery

## 2013-09-28 VITALS — BP 127/64 | HR 54 | Resp 18 | Ht 70.0 in | Wt 231.0 lb

## 2013-09-28 DIAGNOSIS — I739 Peripheral vascular disease, unspecified: Secondary | ICD-10-CM

## 2013-09-28 NOTE — Progress Notes (Signed)
Subjective:     Patient ID: Nicholas [name], male   DOB: [birth year], 73 y.o.   MRN: [id number]  HPI this 73 year old male returns 3 weeks post left AKA for gangrenous left foot with severe tibial occlusive disease. He states that he has had minimal discomfort in the left AKA stump. He is not able to ambulate because of chronic discomfort and edema in his right leg. He did spend time in the rehabilitation unit with a discussion of possible prosthesis and the rehabilitation unit is scheduled to followup with him regarding this.  Review of Systems     Objective:   Physical Exam BP 127/64  Pulse 54  Resp 18  Ht 5\' 10"  (1.778 m)  Wt 231 lb (104.781 kg)  BMI 33.15 kg/m2  Gen. elderly male no apparent stress alert and oriented x3 Left AKA has healed nicely-skin staples removed-no evidence of infection or drainage     Assessment:     Doing well post left AKA for gangrenous left foot-non-salvageable or reconstructable    Plan:     Will return to see us on a when necessary basis Will followup with [facility] regarding possible prosthesis in future although I think this is unlikely to be successful

## 2013-10-04 ENCOUNTER — Telehealth: Payer: Self-pay

## 2013-10-04 ENCOUNTER — Telehealth: Payer: Self-pay | Admitting: Vascular Surgery

## 2013-10-04 NOTE — Telephone Encounter (Signed)
Message copied by Fredrich BirksMILLIKAN, DANA P on Mon Oct 04, 2013  4:40 PM ------      Message from: Phillips OdorPULLINS, CAROL S      Created: Mon Oct 04, 2013 10:57 AM      Regarding: needs nurse visit 3/3      Contact: [phone]       Can you schedule this man for a nurse visit tomorrow for a retained staple in left AKA incision site; his son called and requested appt.  (this is a [facility] pt.) ------

## 2013-10-04 NOTE — Telephone Encounter (Signed)
[name] was seen today for staple removal, pt noticed/felt one remaining staple last week.  Wound healing well, no drainage, swelling or infection notice.  Applied clean dry dressing to area.  Alyxander Kollmann Eldridge-Lewis, RMA-AMT

## 2013-10-04 NOTE — Telephone Encounter (Signed)
Spoke with pts son. He came by today 10/04/13 and Chanda removed staple. dpm

## 2013-10-13 ENCOUNTER — Encounter: Payer: Self-pay | Admitting: Physical Medicine & Rehabilitation

## 2013-10-13 ENCOUNTER — Encounter: Payer: Medicare Other | Attending: Physical Medicine & Rehabilitation | Admitting: Physical Medicine & Rehabilitation

## 2013-10-13 VITALS — BP 116/55 | HR 60 | Resp 14 | Ht 70.0 in

## 2013-10-13 DIAGNOSIS — Z8673 Personal history of transient ischemic attack (TIA), and cerebral infarction without residual deficits: Secondary | ICD-10-CM | POA: Insufficient documentation

## 2013-10-13 DIAGNOSIS — E1142 Type 2 diabetes mellitus with diabetic polyneuropathy: Secondary | ICD-10-CM | POA: Insufficient documentation

## 2013-10-13 DIAGNOSIS — Z89619 Acquired absence of unspecified leg above knee: Secondary | ICD-10-CM

## 2013-10-13 DIAGNOSIS — I10 Essential (primary) hypertension: Secondary | ICD-10-CM | POA: Insufficient documentation

## 2013-10-13 DIAGNOSIS — G547 Phantom limb syndrome without pain: Secondary | ICD-10-CM | POA: Insufficient documentation

## 2013-10-13 DIAGNOSIS — Z950 Presence of cardiac pacemaker: Secondary | ICD-10-CM | POA: Insufficient documentation

## 2013-10-13 DIAGNOSIS — E1159 Type 2 diabetes mellitus with other circulatory complications: Secondary | ICD-10-CM

## 2013-10-13 DIAGNOSIS — I5022 Chronic systolic (congestive) heart failure: Secondary | ICD-10-CM | POA: Insufficient documentation

## 2013-10-13 DIAGNOSIS — I798 Other disorders of arteries, arterioles and capillaries in diseases classified elsewhere: Secondary | ICD-10-CM

## 2013-10-13 DIAGNOSIS — E1149 Type 2 diabetes mellitus with other diabetic neurological complication: Secondary | ICD-10-CM | POA: Insufficient documentation

## 2013-10-13 DIAGNOSIS — S78119A Complete traumatic amputation at level between unspecified hip and knee, initial encounter: Secondary | ICD-10-CM | POA: Insufficient documentation

## 2013-10-13 DIAGNOSIS — I2510 Atherosclerotic heart disease of native coronary artery without angina pectoris: Secondary | ICD-10-CM | POA: Insufficient documentation

## 2013-10-13 DIAGNOSIS — I739 Peripheral vascular disease, unspecified: Secondary | ICD-10-CM | POA: Insufficient documentation

## 2013-10-13 NOTE — Progress Notes (Signed)
Subjective:    Patient ID: Nicholas [name], male    DOB: 29-Sep-1940, 73 y.o.   MRN: [id number]  HPI  Mr. Buchholz is back regarding his left AKA. Therapy has been coming out to the house. He is working on it on his own at home. He is doing stretching and rom. He has phantom sensation in his LLE which has been aggravating more than anything else.   He fell early after discharge but hasn't fallen in 2 weeks as he's been more careful.   He's been standing for short periods of time on his walker. He can stand for a few minutes only. He hasn't walked with the walker yet.   Pain Inventory Average Pain 0 Pain Right Now 0 My pain is na  In the last 24 hours, has pain interfered with the following? General activity 0 Relation with others 0 Enjoyment of life 0 What TIME of day is your pain at its worst? evening Sleep (in general) Poor  Pain is worse with: walking Pain improves with: medication Relief from Meds: na  Mobility walk without assistance use a walker how many minutes can you walk? 0 ability to climb steps?  no do you drive?  no use a wheelchair Do you have any goals in this area?  yes  Function retired  Neuro/Psych No problems in this area  Prior Studies Any changes since last visit?  no  Physicians involved in your care Any changes since last visit?  no   Family History  Problem Relation Age of Onset  . Hypertension Other   . Cancer Other   . Cancer Sister   . Deep vein thrombosis Brother   . Heart disease Brother   . Deep vein thrombosis Brother   . Heart disease Brother   . Cancer Sister    History   Social History  . Marital Status: Married    Spouse Name: N/A    Number of Children: N/A  . Years of Education: N/A   Social History Main Topics  . Smoking status: Former Smoker -- 1.00 packs/day for 20 years    Types: Cigarettes    Quit date: 07/29/1991  . Smokeless tobacco: Former NeurosurgeonUser    Types: Chew    Quit date: 08/06/1987  . Alcohol  Use: No     Comment: occasionallly  . Drug Use: No  . Sexual Activity: Not Currently   Other Topics Concern  . None   Social History Narrative  . None   Past Surgical History  Procedure Laterality Date  . Appendectomy    . Lumbar laminectomy    . Mastoid debridement    . Knee and ankle surgery  1979    "caved in in a ditch; messed up legs"; right  . Cholecystectomy    . Back surgery      "5 or 6"  . Peripheral arterial stent graft      "don't remember which side"  . Coronary artery bypass graft  1983; 1992    "CABG X 5; CABG X 8"  . Lower extremity angiogram  01/21/2012    Procedure: LOWER EXTREMITY ANGIOGRAM;  Surgeon: Nada LibmanVance W Brabham, MD;  [facility];  Service: Vascular;  Laterality: Bilateral;  WITH POSSIBLE INTERVENTION  . Eye surgery      "laser; both eyes"  . Eye surgery  05/08/2012    Cataract Left eye  . Heel bx  Dec. 17, 2013    Right Heel Bx.    .Marland Kitchen  Transluminal atherectomy popliteal artery  01/21/2012    Left  . Amputation Left 08/27/2013    Procedure: LEFT ABOVE THE KNEE AMPUTATION ;  Surgeon: Pryor Ochoa, MD;  Location: [facility] OR;  Service: Vascular;  Laterality: Left;   Past Medical History  Diagnosis Date  . Hypertension   . Peripheral vascular disease   . Ulcer     left foot  . Dyslipidemia   . Chronic venous insufficiency   . Sleep apnea   . Morbid obesity   . Diverticulosis   . Benign prostatic hypertrophy   . Glaucoma   . Gout   . Depression   . Insomnia   . CAD (coronary artery disease), native coronary artery   . GERD (gastroesophageal reflux disease)   . Angina   . Pneumonia 12/11/11  . Type II diabetes mellitus   . Chronic daily headache     "~ all the time"  . Seizures   . Stroke ~ 1983    "little weaker right hand"  . MRSA (methicillin resistant staph aureus) culture positive Dec. 17, 2013     right heel infection   BP 116/55  Pulse 60  Resp 14  Ht 5\' 10"  (1.778 m)  SpO2 95%  Opioid Risk Score:   Fall Risk Score: High Fall  Risk (>13 points) (pt educated on fall risk, brochue given to pt)   Review of Systems  All other systems reviewed and are negative.       Objective:   Physical Exam  Constitutional: He appears well-developed. No distress.  obese male  HENT:  Head: Normocephalic and atraumatic.  Right Ear: External ear normal.  Left Ear: External ear normal.  Eyes: Conjunctivae and EOM are normal. Pupils are equal, round, and reactive to light.  Neck: Normal range of motion. Neck supple. No JVD present. No tracheal deviation present. No thyromegaly present.  Cardiovascular: Normal rate, regular rhythm and normal heart sounds. Exam reveals no gallop and no friction rub.  No murmur heard.  Respiratory: Effort normal and breath sounds normal. No respiratory distress. He has no wheezes. He has no rales. Wearing oxygen via [place].  GI: Soft. Bowel sounds are normal. He exhibits no distension. There is no tenderness. There is no rebound.  Musculoskeletal:  Left leg with good shape, still with mild swelling. Right foot with pes planus, degenerative changes. Right knee with sclerotic changes and deformity Lymphadenopathy:  He has no cervical adenopathy.  Neurological:  Patient is appropriate. Decreased PP and LT below the knee on the right leg. Sensory lost in fingers and hans as well.  Skin: Amputation site is well healed  Psychiatric: He has a normal mood and affect. His behavior is normal. Thought content normal    Assessment/Plan:  1. Functional deficits secondary to left AKA   -he's not ready for a prosthesis yet  -continue with HH to build up stamina and standing tolerance  -consider outpt pre-gait training  -he is not ready for a prosthesis at this time  -there are substantial CV risks as well here and when you factor in his joint problems, I am not sure how realistic a prosthesis is.. 2. DVT Prophylaxis/Anticoagulation: Subcutaneous Lovenox. Monitor platelet counts any signs of bleeding  3. Pain  Management: Hydrocodone as needed. generally has been controlled.  4. Mood/anxiety.  7. Coronary artery disease with CABG.  8. Diabetes mellitus with peripheral neuropathy.  9. Hypertension.  10. Systolic congestive heart failure.11. Hyperlipidemia. Zocor.   13. Gout. Zyloprim 300  mg daily. Monitor for any signs gout flare up

## 2013-10-13 NOTE — Patient Instructions (Signed)
CONTINUE TO WORK ON YOUR STANDING AND EXERCISES TO BUILD UP STRENGTH IN YOUR BACK/STOMACH/ AND LEGS.  YOU WILL NEED TO BE STRONGER TO TOLERATE WALKING ON A PROSTHETIC LEG.   WE CAN ALSO CONSIDER OUTPATIENT THERAPY TO BUILD UP STRENGTH IF YOUR HOME THERAPIES RUN OUT.

## 2013-11-24 ENCOUNTER — Telehealth: Payer: Medicare Other

## 2013-11-24 DIAGNOSIS — Z89619 Acquired absence of unspecified leg above knee: Secondary | ICD-10-CM

## 2013-11-24 NOTE — Telephone Encounter (Signed)
[facility] needs an order to evaluate and treat for Left AK prosthesis.  Fax to [phone].

## 2013-11-24 NOTE — Telephone Encounter (Signed)
Done

## 2013-11-25 NOTE — Telephone Encounter (Signed)
Prosthesis order faxed to [facility] at [phone]

## 2013-12-02 ENCOUNTER — Ambulatory Visit: Payer: Medicare Other | Admitting: Family

## 2013-12-02 ENCOUNTER — Other Ambulatory Visit (HOSPITAL_COMMUNITY): Payer: Medicare Other

## 2013-12-13 ENCOUNTER — Encounter: Payer: Medicare Other | Attending: Physical Medicine & Rehabilitation | Admitting: Physical Medicine & Rehabilitation

## 2013-12-13 ENCOUNTER — Encounter: Payer: Self-pay | Admitting: Physical Medicine & Rehabilitation

## 2013-12-13 VITALS — BP 147/76 | HR 57 | Resp 14 | Ht 70.0 in

## 2013-12-13 DIAGNOSIS — Z89619 Acquired absence of unspecified leg above knee: Secondary | ICD-10-CM

## 2013-12-13 DIAGNOSIS — Z8673 Personal history of transient ischemic attack (TIA), and cerebral infarction without residual deficits: Secondary | ICD-10-CM

## 2013-12-13 DIAGNOSIS — E1121 Type 2 diabetes mellitus with diabetic nephropathy: Secondary | ICD-10-CM

## 2013-12-13 DIAGNOSIS — E1149 Type 2 diabetes mellitus with other diabetic neurological complication: Secondary | ICD-10-CM | POA: Insufficient documentation

## 2013-12-13 DIAGNOSIS — E1159 Type 2 diabetes mellitus with other circulatory complications: Secondary | ICD-10-CM

## 2013-12-13 DIAGNOSIS — E1142 Type 2 diabetes mellitus with diabetic polyneuropathy: Secondary | ICD-10-CM | POA: Insufficient documentation

## 2013-12-13 DIAGNOSIS — I5022 Chronic systolic (congestive) heart failure: Secondary | ICD-10-CM | POA: Insufficient documentation

## 2013-12-13 DIAGNOSIS — N058 Unspecified nephritic syndrome with other morphologic changes: Secondary | ICD-10-CM

## 2013-12-13 DIAGNOSIS — I10 Essential (primary) hypertension: Secondary | ICD-10-CM | POA: Insufficient documentation

## 2013-12-13 DIAGNOSIS — Z950 Presence of cardiac pacemaker: Secondary | ICD-10-CM | POA: Insufficient documentation

## 2013-12-13 DIAGNOSIS — I739 Peripheral vascular disease, unspecified: Secondary | ICD-10-CM | POA: Insufficient documentation

## 2013-12-13 DIAGNOSIS — E1129 Type 2 diabetes mellitus with other diabetic kidney complication: Secondary | ICD-10-CM

## 2013-12-13 DIAGNOSIS — S78119A Complete traumatic amputation at level between unspecified hip and knee, initial encounter: Secondary | ICD-10-CM

## 2013-12-13 DIAGNOSIS — I2510 Atherosclerotic heart disease of native coronary artery without angina pectoris: Secondary | ICD-10-CM

## 2013-12-13 DIAGNOSIS — I5032 Chronic diastolic (congestive) heart failure: Secondary | ICD-10-CM

## 2013-12-13 DIAGNOSIS — G547 Phantom limb syndrome without pain: Secondary | ICD-10-CM | POA: Insufficient documentation

## 2013-12-13 DIAGNOSIS — I798 Other disorders of arteries, arterioles and capillaries in diseases classified elsewhere: Secondary | ICD-10-CM

## 2013-12-13 NOTE — Patient Instructions (Signed)
TAKE YOUR TIME.

## 2013-12-13 NOTE — Progress Notes (Signed)
Subjective:    Patient ID: Nicholas [name], male    DOB: 1940-11-07, 73 y.o.   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 He hasn't had therapy come out to the house for some time. On his own, he has been doing some standing with the walker, working on right leg strength and upper extremity strength. He is able to stand up on the walker for about 5 minutes at most. He has been out to [facility] and stood up on his prosthetic leg in the parallel bars for longer. He is to receive the final prosthesis this week.   His pain is controlled. Currently, he is taking one norco per day on avg for pain control. Constipation is sometimes a problem.   Pain Inventory Average Pain 5 Pain Right Now 5 My pain is intermittent and dull  In the last 24 hours, has pain interfered with the following? General activity 2 Relation with others 2 Enjoyment of life 2 What TIME of day is your pain at its worst? morning Sleep (in general) Poor  Pain is worse with: walking Pain improves with: rest Relief from Meds: 10  Mobility ability to climb steps?  no do you drive?  no use a wheelchair Do you have any goals in this area?  yes  Function retired I need assistance with the following:  bathing and toileting  Neuro/Psych No problems in this area  Prior Studies Any changes since last visit?  no  Physicians involved in your care Any changes since last visit?  no   Family History  Problem Relation Age of Onset  . Hypertension Other   . Cancer Other   . Cancer Sister   . Deep vein thrombosis Brother   . Heart disease Brother   . Deep vein thrombosis Brother   . Heart disease Brother   . Cancer Sister    History   Social History  . Marital Status: Married    Spouse Name: N/A    Number of Children: N/A  . Years of Education: N/A   Social History Main Topics  . Smoking status: Former Smoker -- 1.00 packs/day for 20 years    Types: Cigarettes    Quit  date: 07/29/1991  . Smokeless tobacco: Former NeurosurgeonUser    Types: Chew    Quit date: 08/06/1987  . Alcohol Use: No     Comment: occasionallly  . Drug Use: No  . Sexual Activity: Not Currently   Other Topics Concern  . None   Social History Narrative  . None   Past Surgical History  Procedure Laterality Date  . Appendectomy    . Lumbar laminectomy    . Mastoid debridement    . Knee and ankle surgery  1979    "caved in in a ditch; messed up legs"; right  . Cholecystectomy    . Back surgery      "5 or 6"  . Peripheral arterial stent graft      "don't remember which side"  . Coronary artery bypass graft  1983; 1992    "CABG X 5; CABG X 8"  . Lower extremity angiogram  01/21/2012    Procedure: LOWER EXTREMITY ANGIOGRAM;  Surgeon: Nada LibmanVance W Brabham, MD;  [facility];  Service: Vascular;  Laterality: Bilateral;  WITH POSSIBLE INTERVENTION  . Eye surgery      "laser; both eyes"  . Eye surgery  05/08/2012    Cataract Left eye  .  Heel bx  Dec. 17, 2013    Right Heel Bx.    . Transluminal atherectomy popliteal artery  01/21/2012    Left  . Amputation Left 08/27/2013    Procedure: LEFT ABOVE THE KNEE AMPUTATION ;  Surgeon: Pryor OchoaJames D Lawson, MD;  [facility];  Service: Vascular;  Laterality: Left;   Past Medical History  Diagnosis Date  . Hypertension   . Peripheral vascular disease   . Ulcer     left foot  . Dyslipidemia   . Chronic venous insufficiency   . Sleep apnea   . Morbid obesity   . Diverticulosis   . Benign prostatic hypertrophy   . Glaucoma   . Gout   . Depression   . Insomnia   . CAD (coronary artery disease), native coronary artery   . GERD (gastroesophageal reflux disease)   . Angina   . Pneumonia 12/11/11  . Type II diabetes mellitus   . Chronic daily headache     "~ all the time"  . Seizures   . Stroke ~ 1983    "little weaker right hand"  . MRSA (methicillin resistant staph aureus) culture positive Dec. 17, 2013     right heel infection   BP 147/76   Pulse 57  Resp 14  Ht 5\' 10"  (1.778 m)  SpO2 100%  Opioid Risk Score:   Fall Risk Score:      Review of Systems  All other systems reviewed and are negative.      Objective:   Physical Exam  Constitutional: He appears well-developed. No distress.  obese male  HENT:  Head: Normocephalic and atraumatic.  Right Ear: External ear normal.  Left Ear: External ear normal.  Eyes: Conjunctivae and EOM are normal. Pupils are equal, round, and reactive to light.  Neck: Normal range of motion. Neck supple. No JVD present. No tracheal deviation present. No thyromegaly present.  Cardiovascular: Normal rate, regular rhythm and normal heart sounds. Exam reveals no gallop and no friction rub.  No murmur heard.  Respiratory: Effort normal and breath sounds normal. No respiratory distress. He has no wheezes. He has no rales. Wearing oxygen via [place].  GI: Soft. Bowel sounds are normal. He exhibits no distension. There is no tenderness. There is no rebound.  Musculoskeletal:  Left leg with good shape and completely healed.  Right foot with pes planus, degenerative changes. Right knee with sclerotic changes and deformity Lymphadenopathy:  He has no cervical adenopathy.  Neurological:  Patient is appropriate. Decreased PP and LT below the knee on the right leg. Sensory lost in fingers and hands as well.  Skin: Amputation site is closed.  Psychiatric: He has a normal mood and affect. His behavior is normal. Thought content normal    Assessment/Plan:  1. Functional deficits secondary to left AKA  -prosthetic final fitting and delivery this week -made referral to PT for gait training. He is supposed to have his first appt next Monday. 2. Pain Management: Hydrocodone as needed per PCP  3. Coronary artery disease with CABG.  4. Diabetes mellitus with peripheral neuropathy.--needs ongoing focus on glycemic controlled. 5. Hypertension. Per pcp 6. Systolic congestive heart failure.--watch fluid  intake/bp per cards.  Follow up with me in about 3 months. 15 minutes of face to face patient care time were spent during this visit. All questions were encouraged and answered.

## 2013-12-20 ENCOUNTER — Ambulatory Visit: Payer: Medicare Other | Attending: Physical Medicine & Rehabilitation | Admitting: Physical Therapy

## 2013-12-20 DIAGNOSIS — IMO0001 Reserved for inherently not codable concepts without codable children: Secondary | ICD-10-CM | POA: Diagnosis present

## 2013-12-20 DIAGNOSIS — R269 Unspecified abnormalities of gait and mobility: Secondary | ICD-10-CM | POA: Diagnosis not present

## 2013-12-20 DIAGNOSIS — M6281 Muscle weakness (generalized): Secondary | ICD-10-CM | POA: Insufficient documentation

## 2013-12-20 DIAGNOSIS — Z4789 Encounter for other orthopedic aftercare: Secondary | ICD-10-CM | POA: Diagnosis not present

## 2013-12-20 DIAGNOSIS — R5381 Other malaise: Secondary | ICD-10-CM | POA: Diagnosis not present

## 2013-12-23 ENCOUNTER — Ambulatory Visit: Payer: Medicare Other | Admitting: Physical Therapy

## 2013-12-23 DIAGNOSIS — IMO0001 Reserved for inherently not codable concepts without codable children: Secondary | ICD-10-CM | POA: Diagnosis not present

## 2013-12-29 ENCOUNTER — Ambulatory Visit: Payer: Medicare Other | Admitting: Physical Therapy

## 2013-12-29 DIAGNOSIS — IMO0001 Reserved for inherently not codable concepts without codable children: Secondary | ICD-10-CM | POA: Diagnosis not present

## 2013-12-30 ENCOUNTER — Ambulatory Visit: Payer: Medicare Other | Admitting: Physical Therapy

## 2013-12-30 DIAGNOSIS — IMO0001 Reserved for inherently not codable concepts without codable children: Secondary | ICD-10-CM | POA: Diagnosis not present

## 2014-01-03 ENCOUNTER — Ambulatory Visit: Payer: Medicare Other | Admitting: Family

## 2014-01-03 ENCOUNTER — Other Ambulatory Visit (HOSPITAL_COMMUNITY): Payer: Medicare Other

## 2014-01-03 ENCOUNTER — Ambulatory Visit: Payer: Medicare Other | Attending: Physical Medicine & Rehabilitation | Admitting: Physical Therapy

## 2014-01-03 DIAGNOSIS — IMO0001 Reserved for inherently not codable concepts without codable children: Secondary | ICD-10-CM | POA: Diagnosis not present

## 2014-01-03 DIAGNOSIS — Z4789 Encounter for other orthopedic aftercare: Secondary | ICD-10-CM | POA: Insufficient documentation

## 2014-01-03 DIAGNOSIS — M6281 Muscle weakness (generalized): Secondary | ICD-10-CM | POA: Insufficient documentation

## 2014-01-03 DIAGNOSIS — R5381 Other malaise: Secondary | ICD-10-CM | POA: Diagnosis not present

## 2014-01-03 DIAGNOSIS — R269 Unspecified abnormalities of gait and mobility: Secondary | ICD-10-CM | POA: Insufficient documentation

## 2014-01-05 ENCOUNTER — Ambulatory Visit: Payer: Medicare Other | Admitting: Physical Therapy

## 2014-01-05 DIAGNOSIS — IMO0001 Reserved for inherently not codable concepts without codable children: Secondary | ICD-10-CM | POA: Diagnosis not present

## 2014-01-10 ENCOUNTER — Ambulatory Visit: Payer: Medicare Other | Admitting: Physical Therapy

## 2014-01-10 DIAGNOSIS — IMO0001 Reserved for inherently not codable concepts without codable children: Secondary | ICD-10-CM | POA: Diagnosis not present

## 2014-01-12 ENCOUNTER — Ambulatory Visit: Payer: Medicare Other | Admitting: Physical Therapy

## 2014-01-12 DIAGNOSIS — IMO0001 Reserved for inherently not codable concepts without codable children: Secondary | ICD-10-CM | POA: Diagnosis not present

## 2014-01-17 ENCOUNTER — Ambulatory Visit: Payer: Medicare Other | Admitting: Physical Therapy

## 2014-01-17 DIAGNOSIS — IMO0001 Reserved for inherently not codable concepts without codable children: Secondary | ICD-10-CM | POA: Diagnosis not present

## 2014-01-19 ENCOUNTER — Ambulatory Visit: Payer: Medicare Other | Admitting: Physical Therapy

## 2014-01-19 DIAGNOSIS — IMO0001 Reserved for inherently not codable concepts without codable children: Secondary | ICD-10-CM | POA: Diagnosis not present

## 2014-01-24 ENCOUNTER — Encounter: Payer: Medicare Other | Admitting: Physical Therapy

## 2014-01-26 ENCOUNTER — Encounter: Payer: Medicare Other | Admitting: Physical Therapy

## 2014-01-31 ENCOUNTER — Ambulatory Visit: Payer: Medicare Other | Admitting: Physical Therapy

## 2014-02-02 ENCOUNTER — Encounter: Payer: Medicare Other | Admitting: Physical Therapy

## 2014-02-07 ENCOUNTER — Ambulatory Visit: Payer: Medicare Other | Attending: Physical Medicine & Rehabilitation | Admitting: Physical Therapy

## 2014-02-07 DIAGNOSIS — M6281 Muscle weakness (generalized): Secondary | ICD-10-CM | POA: Insufficient documentation

## 2014-02-07 DIAGNOSIS — R5381 Other malaise: Secondary | ICD-10-CM | POA: Diagnosis not present

## 2014-02-07 DIAGNOSIS — R269 Unspecified abnormalities of gait and mobility: Secondary | ICD-10-CM | POA: Diagnosis not present

## 2014-02-07 DIAGNOSIS — IMO0001 Reserved for inherently not codable concepts without codable children: Secondary | ICD-10-CM | POA: Insufficient documentation

## 2014-02-07 DIAGNOSIS — Z4789 Encounter for other orthopedic aftercare: Secondary | ICD-10-CM | POA: Diagnosis not present

## 2014-02-09 ENCOUNTER — Ambulatory Visit: Payer: Medicare Other | Admitting: Physical Therapy

## 2014-02-09 DIAGNOSIS — IMO0001 Reserved for inherently not codable concepts without codable children: Secondary | ICD-10-CM | POA: Diagnosis not present

## 2014-02-14 ENCOUNTER — Ambulatory Visit: Payer: Medicare Other | Admitting: Physical Therapy

## 2014-02-14 DIAGNOSIS — IMO0001 Reserved for inherently not codable concepts without codable children: Secondary | ICD-10-CM | POA: Diagnosis not present

## 2014-02-16 ENCOUNTER — Ambulatory Visit: Payer: Medicare Other | Admitting: Physical Therapy

## 2014-02-16 DIAGNOSIS — IMO0001 Reserved for inherently not codable concepts without codable children: Secondary | ICD-10-CM | POA: Diagnosis not present

## 2014-02-21 ENCOUNTER — Encounter: Payer: Medicare Other | Admitting: Physical Therapy

## 2014-02-21 ENCOUNTER — Other Ambulatory Visit (HOSPITAL_COMMUNITY): Payer: Self-pay | Admitting: Physician Assistant

## 2014-02-23 ENCOUNTER — Encounter: Payer: Medicare Other | Admitting: Physical Therapy

## 2014-02-28 ENCOUNTER — Encounter: Payer: Medicare Other | Admitting: Physical Therapy

## 2014-03-02 ENCOUNTER — Encounter: Payer: Medicare Other | Admitting: Physical Therapy

## 2014-03-07 ENCOUNTER — Encounter: Payer: Medicare Other | Admitting: Physical Therapy

## 2014-03-09 ENCOUNTER — Encounter: Payer: Medicare Other | Admitting: Physical Therapy

## 2014-03-14 ENCOUNTER — Encounter: Payer: Medicare Other | Admitting: Physical Therapy

## 2014-03-16 ENCOUNTER — Encounter: Payer: Medicare Other | Admitting: Physical Therapy

## 2014-03-21 ENCOUNTER — Encounter: Payer: Self-pay | Admitting: Physical Medicine & Rehabilitation

## 2014-03-21 ENCOUNTER — Other Ambulatory Visit: Payer: Self-pay

## 2014-03-21 ENCOUNTER — Encounter: Payer: Medicare Other | Admitting: Physical Therapy

## 2014-03-21 ENCOUNTER — Encounter: Payer: Medicare Other | Attending: Physical Medicine & Rehabilitation | Admitting: Physical Medicine & Rehabilitation

## 2014-03-21 DIAGNOSIS — I2510 Atherosclerotic heart disease of native coronary artery without angina pectoris: Secondary | ICD-10-CM

## 2014-03-21 DIAGNOSIS — M1710 Unilateral primary osteoarthritis, unspecified knee: Secondary | ICD-10-CM

## 2014-03-21 DIAGNOSIS — M1711 Unilateral primary osteoarthritis, right knee: Secondary | ICD-10-CM | POA: Insufficient documentation

## 2014-03-21 DIAGNOSIS — S78119A Complete traumatic amputation at level between unspecified hip and knee, initial encounter: Secondary | ICD-10-CM

## 2014-03-21 DIAGNOSIS — Z89612 Acquired absence of left leg above knee: Secondary | ICD-10-CM

## 2014-03-21 DIAGNOSIS — M79609 Pain in unspecified limb: Secondary | ICD-10-CM | POA: Diagnosis present

## 2014-03-21 DIAGNOSIS — M79661 Pain in right lower leg: Secondary | ICD-10-CM

## 2014-03-21 MED ORDER — OXYCODONE-ACETAMINOPHEN 10-325 MG PO TABS: 1.0000 | ORAL_TABLET | Freq: Two times a day (BID) | ORAL | Status: DC

## 2014-03-21 NOTE — Progress Notes (Signed)
Subjective:    Patient ID: Nicholas [name], male    DOB: 03/06/1941, 73 y.o.   MRN: [id number]  HPI  Nicholas [name] is back regarding his left AKA. He was fitted with his left AKA which is fitting appropriately. He has had more problems with his RIGHT leg unfortunately due to his chronic right knee issues.   He has been working with outpt PT to address gait training and weight bearing. He has been standing in the parallel bars and doing some short dx ambulation with a rolling walker. The right knee wants to "give out" and it tends to hurt quite a bit with weight bearing.   He states that therapy has mentioned a brace of some sort, but he's never been trialed in one before.    Pain Inventory Average Pain 6 Pain Right Now 6 My pain is constant, sharp, burning and aching  In the last 24 hours, has pain interfered with the following? General activity 7 Relation with others 7 Enjoyment of life 7 What TIME of day is your pain at its worst? constant all day Sleep (in general) Good  Pain is worse with: walking, bending, sitting, inactivity, standing and some activites Pain improves with: rest, heat/ice, therapy/exercise and injections Relief from Meds: 5  Mobility walk with assistance ability to climb steps?  no do you drive?  no use a wheelchair transfers alone Do you have any goals in this area?  yes  Function retired I need assistance with the following:  feeding, dressing, bathing and toileting  Neuro/Psych No problems in this area  Prior Studies Any changes since last visit?  no  Physicians involved in your care Any changes since last visit?  no   Family History  Problem Relation Age of Onset  . Hypertension Other   . Cancer Other   . Cancer Sister   . Deep vein thrombosis Brother   . Heart disease Brother   . Deep vein thrombosis Brother   . Heart disease Brother   . Cancer Sister    History   Social History  . Marital Status: Married    Spouse Name:  N/A    Number of Children: N/A  . Years of Education: N/A   Social History Main Topics  . Smoking status: Former Smoker -- 1.00 packs/day for 20 years    Types: Cigarettes    Quit date: 07/29/1991  . Smokeless tobacco: Former Neurosurgeon    Types: Chew    Quit date: 08/06/1987  . Alcohol Use: No     Comment: occasionallly  . Drug Use: No  . Sexual Activity: Not Currently   Other Topics Concern  . None   Social History Narrative  . None   Past Surgical History  Procedure Laterality Date  . Appendectomy    . Lumbar laminectomy    . Mastoid debridement    . Knee and ankle surgery  1979    "caved in in a ditch; messed up legs"; right  . Cholecystectomy    . Back surgery      "5 or 6"  . Peripheral arterial stent graft      "don't remember which side"  . Coronary artery bypass graft  1983; 1992    "CABG X 5; CABG X 8"  . Lower extremity angiogram  01/21/2012    Procedure: LOWER EXTREMITY ANGIOGRAM;  Surgeon: Nada Libman, MD;  Location: [facility] OR;  Service: Vascular;  Laterality: Bilateral;  WITH POSSIBLE INTERVENTION  . Eye  surgery      "laser; both eyes"  . Eye surgery  05/08/2012    Cataract Left eye  . Heel bx  Dec. 17, 2013    Right Heel Bx.    . Transluminal atherectomy popliteal artery  01/21/2012    Left  . Amputation Left 08/27/2013    Procedure: LEFT ABOVE THE KNEE AMPUTATION ;  Surgeon: Pryor Ochoa, MD;  Location: [facility] OR;  Service: Vascular;  Laterality: Left;   Past Medical History  Diagnosis Date  . Hypertension   . Peripheral vascular disease   . Ulcer     left foot  . Dyslipidemia   . Chronic venous insufficiency   . Sleep apnea   . Morbid obesity   . Diverticulosis   . Benign prostatic hypertrophy   . Glaucoma   . Gout   . Depression   . Insomnia   . CAD (coronary artery disease), native coronary artery   . GERD (gastroesophageal reflux disease)   . Angina   . Pneumonia 12/11/11  . Type II diabetes mellitus   . Chronic daily headache     "~ all  the time"  . Seizures   . Stroke ~ 1983    "little weaker right hand"  . MRSA (methicillin resistant staph aureus) culture positive Dec. 17, 2013     right heel infection   BP 140/64  Pulse 65  Resp 14  Ht 5\' 10"  (1.778 m)  SpO2 95%  Opioid Risk Score:   Fall Risk Score: High Fall Risk (>13 points) (pt educated declined brochure)    Review of Systems  Cardiovascular: Positive for leg swelling.  Gastrointestinal: Positive for vomiting.  Endocrine:       High and low blood sugar  Hematological: Bruises/bleeds easily.  All other systems reviewed and are negative.      Objective:   Physical Exam  Constitutional: He appears well-developed. No distress.  obese male  HENT:  Head: Normocephalic and atraumatic.  Right Ear: External ear normal.  Left Ear: External ear normal.  Eyes: Conjunctivae and EOM are normal. Pupils are equal, round, and reactive to light.  Neck: Normal range of motion. Neck supple. No JVD present. No tracheal deviation present. No thyromegaly present.  Cardiovascular: Normal rate, regular rhythm and normal heart sounds. Exam reveals no gallop and no friction rub.  No murmur heard.  Respiratory: Effort normal and breath sounds normal. No respiratory distress. He has no wheezes. He has no rales. Wearing oxygen via [place].  GI: Soft. Bowel sounds are normal. He exhibits no distension. There is no tenderness. There is no rebound.  Musculoskeletal:  Left leg completely healed. Right foot with pes planus, degenerative changes. Right knee with chronic sclerotic changes and deformity. Had full flexion of right knee, lacks about 15 degrees extension Lymphadenopathy:  He has no cervical adenopathy.  Neurological:    Decreased PP and LT below the knee on the right leg. Sensory lost in fingers and hands as well.  Skin: Amputation site is closed.  Psychiatric: He has a normal mood and affect. His behavior is normal. Thought content normal   Assessment/Plan:  1.  Functional deficits secondary to left AKA  -prosthetic gait training -will send to [facility] for right knee brace  -I think he can potentially walk, but his walking would be limited to household at best 2. Pain Management: will try percocet 10/325 scheduled before activity in the morning and lunch.  -a CSA at next visit if this  proves to be something we continue 3. Coronary artery disease with CABG.  4. Diabetes mellitus with peripheral neuropathy.  5. Hypertension. Per pcp  6. Systolic congestive heart failure.--watch fluid intake/bp per cards.  Follow up with me in about 1 month. 15 minutes of face to face patient care time were spent during this visit. All questions were encouraged and answered.

## 2014-03-21 NOTE — Patient Instructions (Signed)
PLEASE CALL ME WITH ANY PROBLEMS OR QUESTIONS (#[phone]).      

## 2014-03-23 ENCOUNTER — Encounter: Payer: Medicare Other | Admitting: Physical Therapy

## 2014-03-28 ENCOUNTER — Encounter: Payer: Medicare Other | Admitting: Physical Therapy

## 2014-03-28 ENCOUNTER — Telehealth: Payer: Self-pay

## 2014-03-28 NOTE — Telephone Encounter (Signed)
OV notes faxed to [phone].

## 2014-03-28 NOTE — Telephone Encounter (Signed)
Joann @ [facility] is requesting OV notes supporting the need for a knee ortho faxed to [phone]

## 2014-03-30 ENCOUNTER — Encounter: Payer: Medicare Other | Admitting: Physical Therapy

## 2014-04-27 ENCOUNTER — Telehealth: Payer: Self-pay | Admitting: *Deleted

## 2014-04-27 ENCOUNTER — Encounter: Payer: Self-pay | Admitting: Physical Medicine & Rehabilitation

## 2014-04-27 ENCOUNTER — Encounter: Payer: Medicare Other | Attending: Physical Medicine & Rehabilitation | Admitting: Physical Medicine & Rehabilitation

## 2014-04-27 VITALS — BP 160/69 | HR 66 | Resp 14

## 2014-04-27 DIAGNOSIS — M1731 Unilateral post-traumatic osteoarthritis, right knee: Secondary | ICD-10-CM

## 2014-04-27 DIAGNOSIS — E1129 Type 2 diabetes mellitus with other diabetic kidney complication: Secondary | ICD-10-CM

## 2014-04-27 DIAGNOSIS — Z5181 Encounter for therapeutic drug level monitoring: Secondary | ICD-10-CM

## 2014-04-27 DIAGNOSIS — M1710 Unilateral primary osteoarthritis, unspecified knee: Secondary | ICD-10-CM | POA: Insufficient documentation

## 2014-04-27 DIAGNOSIS — Z89612 Acquired absence of left leg above knee: Secondary | ICD-10-CM

## 2014-04-27 DIAGNOSIS — I2510 Atherosclerotic heart disease of native coronary artery without angina pectoris: Secondary | ICD-10-CM

## 2014-04-27 DIAGNOSIS — M175 Other unilateral secondary osteoarthritis of knee: Secondary | ICD-10-CM

## 2014-04-27 DIAGNOSIS — S78119A Complete traumatic amputation at level between unspecified hip and knee, initial encounter: Secondary | ICD-10-CM

## 2014-04-27 DIAGNOSIS — Z79899 Other long term (current) drug therapy: Secondary | ICD-10-CM

## 2014-04-27 DIAGNOSIS — M79661 Pain in right lower leg: Secondary | ICD-10-CM

## 2014-04-27 DIAGNOSIS — M79609 Pain in unspecified limb: Secondary | ICD-10-CM

## 2014-04-27 DIAGNOSIS — N058 Unspecified nephritic syndrome with other morphologic changes: Secondary | ICD-10-CM

## 2014-04-27 DIAGNOSIS — E1121 Type 2 diabetes mellitus with diabetic nephropathy: Secondary | ICD-10-CM

## 2014-04-27 MED ORDER — OXYCODONE-ACETAMINOPHEN 10-325 MG PO TABS: 1.0000 | ORAL_TABLET | Freq: Two times a day (BID) | ORAL | Status: DC

## 2014-04-27 NOTE — Patient Instructions (Signed)
PLEASE CALL ME WITH ANY PROBLEMS OR QUESTIONS (#[phone]).      

## 2014-04-27 NOTE — Telephone Encounter (Signed)
We can order a wheelchair eval, that's fine.. My question is: who's going to pay for this? He just received a new left AK prosthesis.

## 2014-04-27 NOTE — Progress Notes (Signed)
Subjective:    Patient ID: Nicholas [name], male    DOB: 1941-07-18, 73 y.o.   MRN: [id number]  HPI  [name] is back regarding his left AKA and chronic knee pain. He has been working on his mobility with HHPT. He is focusing on standing/transfering. He is unable to walk at this point given his prolonged period of immobility and the deficits above.  [name] was back in the hospital a couple weeks ago with what sounds like some fluid overload. He has been feeling better since coming home. Therapy has only been out to his house once since being home.  The percocet seems to help control his pain with activity. He has found the bledsoe brace assist with transferring and pain control as well.    Pain Inventory Average Pain 7 Pain Right Now 6 My pain is burning and aching  In the last 24 hours, has pain interfered with the following? General activity 10 Relation with others 0 Enjoyment of life 0 What TIME of day is your pain at its worst? evening and night Sleep (in general) Fair  Pain is worse with: walking, bending and standing Pain improves with: rest and medication Relief from Meds: 7  Mobility walk without assistance use a walker do you drive?  no use a wheelchair  Function disabled: date disabled . I need assistance with the following:  dressing, bathing, toileting, meal prep, household duties and shopping  Neuro/Psych trouble walking  Prior Studies Any changes since last visit?  no  Physicians involved in your care Any changes since last visit?  no   Family History  Problem Relation Age of Onset  . Hypertension Other   . Cancer Other   . Cancer Sister   . Deep vein thrombosis Brother   . Heart disease Brother   . Deep vein thrombosis Brother   . Heart disease Brother   . Cancer Sister    History   Social History  . Marital Status: Married    Spouse Name: N/A    Number of Children: N/A  . Years of Education: N/A   Social History Main  Topics  . Smoking status: Former Smoker -- 1.00 packs/day for 20 years    Types: Cigarettes    Quit date: 07/29/1991  . Smokeless tobacco: Former Neurosurgeon    Types: Chew    Quit date: 08/06/1987  . Alcohol Use: No     Comment: occasionallly  . Drug Use: No  . Sexual Activity: Not Currently   Other Topics Concern  . None   Social History Narrative  . None   Past Surgical History  Procedure Laterality Date  . Appendectomy    . Lumbar laminectomy    . Mastoid debridement    . Knee and ankle surgery  1979    "caved in in a ditch; messed up legs"; right  . Cholecystectomy    . Back surgery      "5 or 6"  . Peripheral arterial stent graft      "don't remember which side"  . Coronary artery bypass graft  1983; 1992    "CABG X 5; CABG X 8"  . Lower extremity angiogram  01/21/2012    Procedure: LOWER EXTREMITY ANGIOGRAM;  Surgeon: Nada Libman, MD;  Location: [facility] OR;  Service: Vascular;  Laterality: Bilateral;  WITH POSSIBLE INTERVENTION  . Eye surgery      "laser; both eyes"  . Eye surgery  05/08/2012    Cataract Left  eye  . Heel bx  Dec. 17, 2013    Right Heel Bx.    . Transluminal atherectomy popliteal artery  01/21/2012    Left  . Amputation Left 08/27/2013    Procedure: LEFT ABOVE THE KNEE AMPUTATION ;  Surgeon: Pryor Ochoa, MD;  Location: [facility] OR;  Service: Vascular;  Laterality: Left;   Past Medical History  Diagnosis Date  . Hypertension   . Peripheral vascular disease   . Ulcer     left foot  . Dyslipidemia   . Chronic venous insufficiency   . Sleep apnea   . Morbid obesity   . Diverticulosis   . Benign prostatic hypertrophy   . Glaucoma   . Gout   . Depression   . Insomnia   . CAD (coronary artery disease), native coronary artery   . GERD (gastroesophageal reflux disease)   . Angina   . Pneumonia 12/11/11  . Type II diabetes mellitus   . Chronic daily headache     "~ all the time"  . Seizures   . Stroke ~ 1983    "little weaker right hand"  . MRSA  (methicillin resistant staph aureus) culture positive Dec. 17, 2013     right heel infection   BP 160/69  Pulse 66  Resp 14  SpO2 90%  Opioid Risk Score:   Fall Risk Score: High Fall Risk (>13 points) (previously educated and declined handout)   Review of Systems  Respiratory: Positive for cough.   Cardiovascular: Positive for leg swelling.  Musculoskeletal: Positive for gait problem.  All other systems reviewed and are negative.      Objective:   Physical Exam  Constitutional: He appears well-developed. No distress.  obese male  HENT:  Head: Normocephalic and atraumatic.  Right Ear: External ear normal.  Left Ear: External ear normal.  Eyes: Conjunctivae and EOM are normal. Pupils are equal, round, and reactive to light.  Neck: Normal range of motion. Neck supple. No JVD present. No tracheal deviation present. No thyromegaly present.  Cardiovascular: Normal rate, regular rhythm and normal heart sounds. Exam reveals no gallop and no friction rub.  No murmur heard.  Respiratory: Effort normal and breath sounds normal. No respiratory distress. He has no wheezes. He has no rales. Wearing oxygen via [place].  GI: Soft. Bowel sounds are normal. He exhibits no distension. There is no tenderness. There is no rebound.  Musculoskeletal:  Left leg  . Right foot with pes planus, degenerative changes which are persistent.. Right knee with chronic sclerotic changes and deformity--he is wearing a bledsoe brace today. Had full flexion of right knee, lacks about 15 degrees extension Lymphadenopathy:  He has no cervical adenopathy.  Neurological:  Decreased PP and LT below the knee on the right leg. Sensory lost in fingers and hands as well.  Skin: skin intact  Psychiatric: He has a normal mood and affect. His behavior is normal. Thought content normal    Assessment/Plan:  1. Functional deficits secondary to left AKA  -prosthetic pre-gait training---needs more regular intervention -continue  bledsoe brace for right knee  -I think he can potentially walk, but his walking would be limited to household at best down the line.  2. Pain Management: continue percocet 10/325 scheduled before activity in the morning and lunch.  -a CSA today was signed. UDS was collected 3. Coronary artery disease with CABG.  4. Diabetes mellitus with peripheral neuropathy.  5. Hypertension. Per pcp  6. Systolic congestive heart failure.--  per  cards.  Follow up with NP in about 1 month. 15 minutes of face to face patient care time were spent during this visit. All questions were encouraged and answered.

## 2014-04-27 NOTE — Telephone Encounter (Signed)
After Nicholas [name] left you this morning he asked Nicholas [name] about getting a new wheelchair.  Is it ok to order a wheelchair eval at neuro rehab for him?

## 2014-05-25 ENCOUNTER — Encounter: Payer: Medicare Other | Admitting: Registered Nurse

## 2014-05-30 ENCOUNTER — Encounter: Payer: Medicare Other | Attending: Physical Medicine & Rehabilitation | Admitting: Registered Nurse

## 2014-05-30 ENCOUNTER — Encounter: Payer: Self-pay | Admitting: Registered Nurse

## 2014-05-30 ENCOUNTER — Other Ambulatory Visit: Payer: Self-pay | Admitting: Physical Medicine & Rehabilitation

## 2014-05-30 VITALS — BP 160/57 | HR 73 | Resp 14

## 2014-05-30 DIAGNOSIS — Z5181 Encounter for therapeutic drug level monitoring: Secondary | ICD-10-CM | POA: Diagnosis present

## 2014-05-30 DIAGNOSIS — M79661 Pain in right lower leg: Secondary | ICD-10-CM | POA: Diagnosis present

## 2014-05-30 DIAGNOSIS — Z79899 Other long term (current) drug therapy: Secondary | ICD-10-CM | POA: Insufficient documentation

## 2014-05-30 DIAGNOSIS — Z89612 Acquired absence of left leg above knee: Secondary | ICD-10-CM | POA: Diagnosis present

## 2014-05-30 DIAGNOSIS — I2510 Atherosclerotic heart disease of native coronary artery without angina pectoris: Secondary | ICD-10-CM

## 2014-05-30 MED ORDER — OXYCODONE-ACETAMINOPHEN 10-325 MG PO TABS: 1.0000 | ORAL_TABLET | Freq: Two times a day (BID) | ORAL | Status: DC

## 2014-05-31 LAB — PMP ALCOHOL METABOLITE (ETG): Ethyl Glucuronide (EtG): NEGATIVE ng/mL

## 2014-06-01 ENCOUNTER — Encounter: Payer: Self-pay | Admitting: Registered Nurse

## 2014-06-01 LAB — BENZODIAZEPINES (GC/LC/MS), URINE
Alprazolam metabolite (GC/LC/MS), ur confirm: 638 ng/mL (ref <25)
Clonazepam metabolite (GC/LC/MS), ur confirm: NEGATIVE ng/mL (ref <25)
Flurazepam metabolite (GC/LC/MS), ur confirm: NEGATIVE ng/mL (ref <50)
LORAZEPAMU: NEGATIVE ng/mL (ref <50)
MIDAZOLAMU: NEGATIVE ng/mL (ref <50)
Nordiazepam (GC/LC/MS), ur confirm: NEGATIVE ng/mL (ref <50)
Oxazepam (GC/LC/MS), ur confirm: 916 ng/mL (ref <50)
TEMAZEPAMU: 42981 ng/mL (ref <50)
TRIAZOLAMU: NEGATIVE ng/mL (ref <50)

## 2014-06-01 LAB — OPIATES/OPIOIDS (LC/MS-MS)
Codeine Urine: NEGATIVE ng/mL (ref <50)
Hydrocodone: 851 ng/mL — AB (ref <50)
Hydromorphone: 354 ng/mL — AB (ref <50)
Morphine Urine: NEGATIVE ng/mL (ref <50)
NOROXYCODONE, UR: 4732 ng/mL (ref <50)
Norhydrocodone, Ur: NEGATIVE ng/mL (ref <50)
OXYCODONE, UR: 6165 ng/mL (ref <50)
Oxymorphone: 1806 ng/mL (ref <50)

## 2014-06-01 LAB — OXYCODONE, URINE (LC/MS-MS)
NOROXYCODONE, UR: 4732 ng/mL (ref <50)
OXYCODONE, UR: 6165 ng/mL (ref <50)
OXYMORPHONE, URINE: 1806 ng/mL (ref <50)

## 2014-06-01 NOTE — Progress Notes (Signed)
Subjective:    Patient ID: Nicholas [name], male    DOB: 1941-06-14, 73 y.o.   MRN: [id number]  HPI: Nicholas [name] is a 73 year old male who returns for follow up for chronic pain and medication refill. He says his pain is located in his right leg. He rates his pain 8. His current exercise regime is performing stretching exercises to his upper body. He remains in wheelchair at all times. Family in room all questions answered.  Pain Inventory Average Pain 7 Pain Right Now 8 My pain is constant, sharp and stabbing  In the last 24 hours, has pain interfered with the following? General activity 5 Relation with others 5 Enjoyment of life 5 What TIME of day is your pain at its worst? morning Sleep (in general) n/a  Pain is worse with: n/a Pain improves with: rest and medication Relief from Meds: 8  Mobility use a walker ability to climb steps?  no do you drive?  no  Function not employed: date last employed no date provided disabled: date disabled no date provided retired  Neuro/Psych trouble walking  Prior Studies Any changes since last visit?  no  Physicians involved in your care Any changes since last visit?  no   Family History  Problem Relation Age of Onset  . Hypertension Other   . Cancer Other   . Cancer Sister   . Deep vein thrombosis Brother   . Heart disease Brother   . Deep vein thrombosis Brother   . Heart disease Brother   . Cancer Sister    History   Social History  . Marital Status: Married    Spouse Name: N/A    Number of Children: N/A  . Years of Education: N/A   Social History Main Topics  . Smoking status: Former Smoker -- 1.00 packs/day for 20 years    Types: Cigarettes    Quit date: 07/29/1991  . Smokeless tobacco: Former NeurosurgeonUser    Types: Chew    Quit date: 08/06/1987  . Alcohol Use: No     Comment: occasionallly  . Drug Use: No  . Sexual Activity: Not Currently   Other Topics Concern  . None   Social History Narrative   . None   Past Surgical History  Procedure Laterality Date  . Appendectomy    . Lumbar laminectomy    . Mastoid debridement    . Knee and ankle surgery  1979    "caved in in a ditch; messed up legs"; right  . Cholecystectomy    . Back surgery      "5 or 6"  . Peripheral arterial stent graft      "don't remember which side"  . Coronary artery bypass graft  1983; 1992    "CABG X 5; CABG X 8"  . Lower extremity angiogram  01/21/2012    Procedure: LOWER EXTREMITY ANGIOGRAM;  Surgeon: Nada LibmanVance W Brabham, MD;  [facility];  Service: Vascular;  Laterality: Bilateral;  WITH POSSIBLE INTERVENTION  . Eye surgery      "laser; both eyes"  . Eye surgery  05/08/2012    Cataract Left eye  . Heel bx  Dec. 17, 2013    Right Heel Bx.    . Transluminal atherectomy popliteal artery  01/21/2012    Left  . Amputation Left 08/27/2013    Procedure: LEFT ABOVE THE KNEE AMPUTATION ;  Surgeon: Pryor OchoaJames D Lawson, MD;  [facility];  Service: Vascular;  Laterality:  Left;   Past Medical History  Diagnosis Date  . Hypertension   . Peripheral vascular disease   . Ulcer     left foot  . Dyslipidemia   . Chronic venous insufficiency   . Sleep apnea   . Morbid obesity   . Diverticulosis   . Benign prostatic hypertrophy   . Glaucoma   . Gout   . Depression   . Insomnia   . CAD (coronary artery disease), native coronary artery   . GERD (gastroesophageal reflux disease)   . Angina   . Pneumonia 12/11/11  . Type II diabetes mellitus   . Chronic daily headache     "~ all the time"  . Seizures   . Stroke ~ 1983    "little weaker right hand"  . MRSA (methicillin resistant staph aureus) culture positive Dec. 17, 2013     right heel infection   BP 160/57  Pulse 73  Resp 14  SpO2 91%  Opioid Risk Score:   Fall Risk Score:   Review of Systems     Objective:   Physical Exam  Nursing note and vitals reviewed. Constitutional: He is oriented to person, place, and time. He appears well-developed and  well-nourished.  HENT:  Head: Normocephalic and atraumatic.  Neck: Normal range of motion. Neck supple.  Cardiovascular: Normal rate and regular rhythm.   Pulmonary/Chest: Effort normal and breath sounds normal.  Musculoskeletal: He exhibits edema.  Normal Muscle Bulk and Muscle Testing Reveals: Upper Extremities: Full ROM and Muscle Strength 5/5 Lower Extremities: Left AKA/ Prosthesis Leg Intact Right Lower Extremities: Decreased ROM and Muscle Strength 3/5/ Right Knee Brace Intact Arrived in Wheelchair  Neurological: He is alert and oriented to person, place, and time.  Skin: Skin is warm and dry.  Psychiatric: He has a normal mood and affect.          Assessment & Plan:  1. 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 Pain Management: Refilled Percocet 10/325 one tablet twice a day. #60. UDS was collected  3. Coronary artery disease with CABG. Cardiology Following 4. Diabetes mellitus with peripheral neuropathy. PCP Following 5. Hypertension.: PCP Following 6. Systolic congestive heart failure: Cardiology Following  20 minutes of face to face patient care time was spent during this visit. All questions were encouraged and answered.   F/U in 1 month

## 2014-06-02 LAB — PRESCRIPTION MONITORING PROFILE (SOLSTAS)
AMPHETAMINE/METH: NEGATIVE ng/mL
BUPRENORPHINE, URINE: NEGATIVE ng/mL
Barbiturate Screen, Urine: NEGATIVE ng/mL
CANNABINOID SCRN UR: NEGATIVE ng/mL
CREATININE, URINE: 138.57 mg/dL (ref >20.0)
Carisoprodol, Urine: NEGATIVE ng/mL
Cocaine Metabolites: NEGATIVE ng/mL
ECSTASY: NEGATIVE ng/mL
FENTANYL URINE: NEGATIVE ng/mL
MEPERIDINE UR: NEGATIVE ng/mL
METHADONE SCREEN, URINE: NEGATIVE ng/mL
NITRITES URINE, INITIAL: NEGATIVE ug/mL
PROPOXYPHENE: NEGATIVE ng/mL
TAPENTADOLUR: NEGATIVE ng/mL
Tramadol Scrn, Ur: NEGATIVE ng/mL
Zolpidem, Urine: NEGATIVE ng/mL
pH, Initial: 5.7 pH (ref 4.5–8.9)

## 2014-06-15 ENCOUNTER — Telehealth: Payer: Self-pay | Admitting: Physical Medicine & Rehabilitation

## 2014-06-15 NOTE — Telephone Encounter (Signed)
-----   Message from Thurnell LoseSybil Watson Shumaker, RN sent at 06/02/2014  3:40 PM EDT ----- Inconsistent.  Positive for hydrocodone (prescribed oxycodone--present as well)  This is 2nd inconsistent UDS and was told with any further inconsistencies would not receive narcotics.

## 2014-06-15 NOTE — Telephone Encounter (Signed)
He said that he would just rather release Dr Riley KillSwartz and will stay with his previous prescriber... I will cancel his upcoming appt 06/20/14 with Riley LamEunice and discharge from clinic.

## 2014-06-15 NOTE — Telephone Encounter (Signed)
We've warned him. He needs to be non-narcotic.

## 2014-06-20 ENCOUNTER — Encounter: Payer: Medicare Other | Admitting: Registered Nurse

## 2014-06-23 ENCOUNTER — Ambulatory Visit: Payer: Medicare Other | Admitting: Rehabilitative and Restorative Service Providers"

## 2014-07-03 ENCOUNTER — Encounter (HOSPITAL_COMMUNITY): Payer: Self-pay

## 2014-07-03 ENCOUNTER — Emergency Department (HOSPITAL_COMMUNITY): Payer: Medicare Other

## 2014-07-03 ENCOUNTER — Inpatient Hospital Stay (HOSPITAL_COMMUNITY)
Admission: EM | Admit: 2014-07-03 | Discharge: 2014-07-08 | DRG: 689 | Disposition: A | Discharge status: Skilled Nursing Facility | Payer: Medicare Other | Attending: Internal Medicine | Admitting: Internal Medicine

## 2014-07-03 DIAGNOSIS — H409 Unspecified glaucoma: Secondary | ICD-10-CM | POA: Diagnosis present

## 2014-07-03 DIAGNOSIS — Z89612 Acquired absence of left leg above knee: Secondary | ICD-10-CM

## 2014-07-03 DIAGNOSIS — I872 Venous insufficiency (chronic) (peripheral): Secondary | ICD-10-CM | POA: Diagnosis present

## 2014-07-03 DIAGNOSIS — I10 Essential (primary) hypertension: Secondary | ICD-10-CM | POA: Diagnosis present

## 2014-07-03 DIAGNOSIS — M109 Gout, unspecified: Secondary | ICD-10-CM | POA: Diagnosis present

## 2014-07-03 DIAGNOSIS — E1140 Type 2 diabetes mellitus with diabetic neuropathy, unspecified: Secondary | ICD-10-CM | POA: Diagnosis present

## 2014-07-03 DIAGNOSIS — Z79899 Other long term (current) drug therapy: Secondary | ICD-10-CM

## 2014-07-03 DIAGNOSIS — Z6832 Body mass index (BMI) 32.0-32.9, adult: Secondary | ICD-10-CM

## 2014-07-03 DIAGNOSIS — L89154 Pressure ulcer of sacral region, stage 4: Secondary | ICD-10-CM | POA: Diagnosis present

## 2014-07-03 DIAGNOSIS — Z87891 Personal history of nicotine dependence: Secondary | ICD-10-CM

## 2014-07-03 DIAGNOSIS — Z8673 Personal history of transient ischemic attack (TIA), and cerebral infarction without residual deficits: Secondary | ICD-10-CM

## 2014-07-03 DIAGNOSIS — N390 Urinary tract infection, site not specified: Secondary | ICD-10-CM | POA: Diagnosis present

## 2014-07-03 DIAGNOSIS — N289 Disorder of kidney and ureter, unspecified: Secondary | ICD-10-CM | POA: Diagnosis present

## 2014-07-03 DIAGNOSIS — I5032 Chronic diastolic (congestive) heart failure: Secondary | ICD-10-CM | POA: Diagnosis present

## 2014-07-03 DIAGNOSIS — G9340 Encephalopathy, unspecified: Secondary | ICD-10-CM | POA: Diagnosis not present

## 2014-07-03 DIAGNOSIS — E1165 Type 2 diabetes mellitus with hyperglycemia: Secondary | ICD-10-CM | POA: Diagnosis present

## 2014-07-03 DIAGNOSIS — R4182 Altered mental status, unspecified: Secondary | ICD-10-CM

## 2014-07-03 DIAGNOSIS — Z794 Long term (current) use of insulin: Secondary | ICD-10-CM

## 2014-07-03 DIAGNOSIS — R443 Hallucinations, unspecified: Secondary | ICD-10-CM | POA: Diagnosis not present

## 2014-07-03 DIAGNOSIS — Z66 Do not resuscitate: Secondary | ICD-10-CM | POA: Diagnosis present

## 2014-07-03 DIAGNOSIS — F329 Major depressive disorder, single episode, unspecified: Secondary | ICD-10-CM | POA: Diagnosis present

## 2014-07-03 DIAGNOSIS — E1121 Type 2 diabetes mellitus with diabetic nephropathy: Secondary | ICD-10-CM | POA: Diagnosis present

## 2014-07-03 DIAGNOSIS — I739 Peripheral vascular disease, unspecified: Secondary | ICD-10-CM | POA: Diagnosis present

## 2014-07-03 DIAGNOSIS — B9689 Other specified bacterial agents as the cause of diseases classified elsewhere: Secondary | ICD-10-CM | POA: Diagnosis present

## 2014-07-03 DIAGNOSIS — E11649 Type 2 diabetes mellitus with hypoglycemia without coma: Secondary | ICD-10-CM | POA: Diagnosis present

## 2014-07-03 DIAGNOSIS — G4730 Sleep apnea, unspecified: Secondary | ICD-10-CM | POA: Diagnosis present

## 2014-07-03 DIAGNOSIS — Z951 Presence of aortocoronary bypass graft: Secondary | ICD-10-CM

## 2014-07-03 DIAGNOSIS — K219 Gastro-esophageal reflux disease without esophagitis: Secondary | ICD-10-CM | POA: Diagnosis present

## 2014-07-03 DIAGNOSIS — E785 Hyperlipidemia, unspecified: Secondary | ICD-10-CM | POA: Diagnosis present

## 2014-07-03 DIAGNOSIS — N400 Enlarged prostate without lower urinary tract symptoms: Secondary | ICD-10-CM | POA: Diagnosis present

## 2014-07-03 DIAGNOSIS — Z7982 Long term (current) use of aspirin: Secondary | ICD-10-CM

## 2014-07-03 DIAGNOSIS — I2510 Atherosclerotic heart disease of native coronary artery without angina pectoris: Secondary | ICD-10-CM | POA: Diagnosis present

## 2014-07-03 LAB — PROTIME-INR
INR: 1.02 (ref 0.00–1.49)
Prothrombin Time: 13.5 seconds (ref 11.6–15.2)

## 2014-07-03 LAB — CBC WITH DIFFERENTIAL/PLATELET
Basophils Absolute: 0 10*3/uL (ref 0.0–0.1)
Basophils Relative: 0 % (ref 0–1)
EOS PCT: 5 % (ref 0–5)
Eosinophils Absolute: 0.4 10*3/uL (ref 0.0–0.7)
HCT: 40.3 % (ref 39.0–52.0)
HEMOGLOBIN: 12.2 g/dL — AB (ref 13.0–17.0)
Lymphocytes Relative: 39 % (ref 12–46)
Lymphs Abs: 3.4 10*3/uL (ref 0.7–4.0)
MCH: 25.4 pg — AB (ref 26.0–34.0)
MCHC: 30.3 g/dL (ref 30.0–36.0)
MCV: 83.8 fL (ref 78.0–100.0)
MONOS PCT: 5 % (ref 3–12)
Monocytes Absolute: 0.4 10*3/uL (ref 0.1–1.0)
NEUTROS PCT: 51 % (ref 43–77)
Neutro Abs: 4.5 10*3/uL (ref 1.7–7.7)
Platelets: 211 10*3/uL (ref 150–400)
RBC: 4.81 MIL/uL (ref 4.22–5.81)
RDW: 16.7 % — ABNORMAL HIGH (ref 11.5–15.5)
WBC: 8.7 10*3/uL (ref 4.0–10.5)

## 2014-07-03 LAB — URINALYSIS, ROUTINE W REFLEX MICROSCOPIC
Bilirubin Urine: NEGATIVE
Glucose, UA: NEGATIVE mg/dL
HGB URINE DIPSTICK: NEGATIVE
KETONES UR: NEGATIVE mg/dL
Leukocytes, UA: NEGATIVE
NITRITE: POSITIVE — AB
PROTEIN: 30 mg/dL — AB
Specific Gravity, Urine: 1.016 (ref 1.005–1.030)
UROBILINOGEN UA: 0.2 mg/dL (ref 0.0–1.0)
pH: 5 (ref 5.0–8.0)

## 2014-07-03 LAB — CBG MONITORING, ED
GLUCOSE-CAPILLARY: 72 mg/dL (ref 70–99)
Glucose-Capillary: 65 mg/dL — ABNORMAL LOW (ref 70–99)
Glucose-Capillary: 69 mg/dL — ABNORMAL LOW (ref 70–99)

## 2014-07-03 LAB — COMPREHENSIVE METABOLIC PANEL
ALK PHOS: 77 U/L (ref 39–117)
ALT: 5 U/L (ref 0–53)
ANION GAP: 11 (ref 5–15)
AST: 11 U/L (ref 0–37)
Albumin: 3 g/dL — ABNORMAL LOW (ref 3.5–5.2)
BILIRUBIN TOTAL: 0.3 mg/dL (ref 0.3–1.2)
BUN: 14 mg/dL (ref 6–23)
CO2: 26 meq/L (ref 19–32)
Calcium: 9.1 mg/dL (ref 8.4–10.5)
Chloride: 102 mEq/L (ref 96–112)
Creatinine, Ser: 1.14 mg/dL (ref 0.50–1.35)
GFR, EST AFRICAN AMERICAN: 72 mL/min — AB (ref >90)
GFR, EST NON AFRICAN AMERICAN: 62 mL/min — AB (ref >90)
GLUCOSE: 67 mg/dL — AB (ref 70–99)
POTASSIUM: 4.4 meq/L (ref 3.7–5.3)
Sodium: 139 mEq/L (ref 137–147)
Total Protein: 6.3 g/dL (ref 6.0–8.3)

## 2014-07-03 LAB — URINE MICROSCOPIC-ADD ON

## 2014-07-03 LAB — AMMONIA: Ammonia: 21 umol/L (ref 11–60)

## 2014-07-03 LAB — LACTIC ACID, PLASMA: LACTIC ACID, VENOUS: 1.1 mmol/L (ref 0.5–2.2)

## 2014-07-03 MED ORDER — CLOPIDOGREL BISULFATE 75 MG PO TABS
75.0000 mg | ORAL_TABLET | Freq: Every morning | ORAL | Status: DC
  Administered 2014-07-04 – 2014-07-08 (×5): 75 mg via ORAL
  Filled 2014-07-03 (×5): qty 1

## 2014-07-03 MED ORDER — PERPHENAZINE 16 MG PO TABS
16.0000 mg | ORAL_TABLET | Freq: Every day | ORAL | Status: DC
  Administered 2014-07-03 – 2014-07-06 (×3): 16 mg via ORAL
  Filled 2014-07-03 (×7): qty 1

## 2014-07-03 MED ORDER — POTASSIUM CHLORIDE CRYS ER 20 MEQ PO TBCR
40.0000 meq | EXTENDED_RELEASE_TABLET | Freq: Two times a day (BID) | ORAL | Status: DC
  Administered 2014-07-03 – 2014-07-04 (×2): 40 meq via ORAL
  Filled 2014-07-03 (×3): qty 2

## 2014-07-03 MED ORDER — CIPROFLOXACIN IN D5W 400 MG/200ML IV SOLN
400.0000 mg | Freq: Once | INTRAVENOUS | Status: AC
  Administered 2014-07-03: 400 mg via INTRAVENOUS
  Filled 2014-07-03: qty 200

## 2014-07-03 MED ORDER — ONDANSETRON HCL 4 MG PO TABS: 4.0000 mg | ORAL_TABLET | Freq: Three times a day (TID) | ORAL | Status: DC | PRN

## 2014-07-03 MED ORDER — ATENOLOL 50 MG PO TABS
50.0000 mg | ORAL_TABLET | Freq: Two times a day (BID) | ORAL | Status: DC
  Administered 2014-07-03 – 2014-07-08 (×8): 50 mg via ORAL
  Filled 2014-07-03 (×12): qty 1

## 2014-07-03 MED ORDER — BRIMONIDINE TARTRATE 0.2 % OP SOLN
1.0000 [drp] | Freq: Two times a day (BID) | OPHTHALMIC | Status: DC
  Administered 2014-07-03 – 2014-07-08 (×8): 1 [drp] via OPHTHALMIC
  Filled 2014-07-03: qty 5

## 2014-07-03 MED ORDER — ALLOPURINOL 300 MG PO TABS
300.0000 mg | ORAL_TABLET | Freq: Every morning | ORAL | Status: DC
  Administered 2014-07-04 – 2014-07-08 (×5): 300 mg via ORAL
  Filled 2014-07-03 (×5): qty 1

## 2014-07-03 MED ORDER — LATANOPROST 0.005 % OP SOLN
1.0000 [drp] | Freq: Every day | OPHTHALMIC | Status: DC
  Administered 2014-07-03 – 2014-07-06 (×3): 1 [drp] via OPHTHALMIC
  Filled 2014-07-03: qty 2.5

## 2014-07-03 MED ORDER — TEMAZEPAM 15 MG PO CAPS
30.0000 mg | ORAL_CAPSULE | Freq: Every day | ORAL | Status: DC
  Administered 2014-07-03 – 2014-07-06 (×3): 30 mg via ORAL
  Filled 2014-07-03 (×4): qty 2

## 2014-07-03 MED ORDER — ACETAMINOPHEN 325 MG PO TABS: 650.0000 mg | ORAL_TABLET | ORAL | Status: DC | PRN

## 2014-07-03 MED ORDER — MIRTAZAPINE 15 MG PO TABS
15.0000 mg | ORAL_TABLET | Freq: Every day | ORAL | Status: DC
  Administered 2014-07-04 – 2014-07-06 (×3): 15 mg via ORAL
  Filled 2014-07-03 (×6): qty 1

## 2014-07-03 MED ORDER — TIMOLOL MALEATE 0.5 % OP SOLN
1.0000 [drp] | Freq: Two times a day (BID) | OPHTHALMIC | Status: DC
  Administered 2014-07-03 – 2014-07-08 (×8): 1 [drp] via OPHTHALMIC
  Filled 2014-07-03: qty 5

## 2014-07-03 MED ORDER — GLIPIZIDE 5 MG PO TABS: 5.0000 mg | ORAL_TABLET | Freq: Two times a day (BID) | ORAL | Status: DC

## 2014-07-03 MED ORDER — BRIMONIDINE TARTRATE-TIMOLOL 0.2-0.5 % OP SOLN: 1.0000 [drp] | Freq: Every day | OPHTHALMIC | Status: DC

## 2014-07-03 MED ORDER — FUROSEMIDE 80 MG PO TABS
80.0000 mg | ORAL_TABLET | Freq: Every morning | ORAL | Status: DC
  Administered 2014-07-04 – 2014-07-06 (×3): 80 mg via ORAL
  Filled 2014-07-03 (×3): qty 1
  Filled 2014-07-03: qty 2

## 2014-07-03 MED ORDER — ASPIRIN EC 81 MG PO TBEC
81.0000 mg | DELAYED_RELEASE_TABLET | Freq: Every day | ORAL | Status: DC
  Administered 2014-07-04 – 2014-07-08 (×5): 81 mg via ORAL
  Filled 2014-07-03 (×5): qty 1

## 2014-07-03 MED ORDER — ALUM & MAG HYDROXIDE-SIMETH 200-200-20 MG/5ML PO SUSP
30.0000 mL | ORAL | Status: DC | PRN
Start: 2014-07-03 — End: 2014-07-08

## 2014-07-03 MED ORDER — LORAZEPAM 1 MG PO TABS
1.0000 mg | ORAL_TABLET | Freq: Three times a day (TID) | ORAL | Status: DC | PRN
  Filled 2014-07-03: qty 1

## 2014-07-03 MED ORDER — GLIPIZIDE ER 5 MG PO TB24
5.0000 mg | ORAL_TABLET | Freq: Two times a day (BID) | ORAL | Status: DC
  Filled 2014-07-03 (×2): qty 1

## 2014-07-03 MED ORDER — PANTOPRAZOLE SODIUM 40 MG PO TBEC
40.0000 mg | DELAYED_RELEASE_TABLET | Freq: Every day | ORAL | Status: DC
  Administered 2014-07-04 – 2014-07-08 (×5): 40 mg via ORAL
  Filled 2014-07-03 (×5): qty 1

## 2014-07-03 MED ORDER — TAMSULOSIN HCL 0.4 MG PO CAPS
0.8000 mg | ORAL_CAPSULE | Freq: Every day | ORAL | Status: DC
  Administered 2014-07-03 – 2014-07-06 (×3): 0.8 mg via ORAL
  Filled 2014-07-03 (×6): qty 2

## 2014-07-03 MED ORDER — ZOLPIDEM TARTRATE 5 MG PO TABS: 5.0000 mg | ORAL_TABLET | Freq: Every evening | ORAL | Status: DC | PRN

## 2014-07-03 MED ORDER — INSULIN GLARGINE 100 UNIT/ML ~~LOC~~ SOLN: 5.0000 [IU] | Freq: Every day | SUBCUTANEOUS | Status: DC

## 2014-07-03 MED ORDER — SODIUM CHLORIDE 0.9 % IV SOLN
1000.0000 mL | INTRAVENOUS | Status: DC
  Administered 2014-07-03: 1000 mL via INTRAVENOUS

## 2014-07-03 MED ORDER — SIMVASTATIN 20 MG PO TABS
20.0000 mg | ORAL_TABLET | Freq: Every evening | ORAL | Status: DC
  Administered 2014-07-03: 20 mg via ORAL
  Filled 2014-07-03 (×2): qty 1

## 2014-07-03 MED ORDER — ISOSORBIDE MONONITRATE ER 30 MG PO TB24
30.0000 mg | ORAL_TABLET | Freq: Every morning | ORAL | Status: DC
  Administered 2014-07-04 – 2014-07-08 (×5): 30 mg via ORAL
  Filled 2014-07-03 (×5): qty 1

## 2014-07-03 NOTE — ED Notes (Signed)
Bed: [id number] Expected date: 07/03/14 Expected time: 9:42 AM Means of arrival: Ambulance Comments: AMS

## 2014-07-03 NOTE — ED Provider Notes (Signed)
CSN: [id number]     Arrival date & time 07/03/14  4098 History   First MD Initiated Contact with Patient 07/03/14 [phone]     Chief Complaint  Patient presents with  . Altered Mental Status      HPI According to the wife and the son over the last 3-4 weeks the patient has had in decreasing bouts of confusion associated with bouts of hallucinations and paranoia.  He called the police 4 times last night because he was complaining that there were people coming through the walls into his room.  The police removed his guns from his home.  He has no previous psych history.  He does have chronic headaches.  Family states that he takes medication for diabetes and also has some pain medicines. Past Medical History  Diagnosis Date  . Hypertension   . Peripheral vascular disease   . Ulcer     left foot  . Dyslipidemia   . Chronic venous insufficiency   . Sleep apnea   . Morbid obesity   . Diverticulosis   . Benign prostatic hypertrophy   . Glaucoma   . Gout   . Depression   . Insomnia   . CAD (coronary artery disease), native coronary artery   . GERD (gastroesophageal reflux disease)   . Angina   . Pneumonia 12/11/11  . Type II diabetes mellitus   . Chronic daily headache     "~ all the time"  . Seizures   . Stroke ~ 1983    "little weaker right hand"  . MRSA (methicillin resistant staph aureus) culture positive Dec. 17, 2013     right heel infection   Past Surgical History  Procedure Laterality Date  . Appendectomy    . Lumbar laminectomy    . Mastoid debridement    . Knee and ankle surgery  1979    "caved in in a ditch; messed up legs"; right  . Cholecystectomy    . Back surgery      "5 or 6"  . Peripheral arterial stent graft      "don't remember which side"  . Coronary artery bypass graft  1983; 1992    "CABG X 5; CABG X 8"  . Lower extremity angiogram  01/21/2012    Procedure: LOWER EXTREMITY ANGIOGRAM;  Surgeon: Nada Libman, MD;  Location: [facility] OR;  Service: Vascular;   Laterality: Bilateral;  WITH POSSIBLE INTERVENTION  . Eye surgery      "laser; both eyes"  . Eye surgery  05/08/2012    Cataract Left eye  . Heel bx  Dec. 17, 2013    Right Heel Bx.    . Transluminal atherectomy popliteal artery  01/21/2012    Left  . Amputation Left 08/27/2013    Procedure: LEFT ABOVE THE KNEE AMPUTATION ;  Surgeon: Pryor Ochoa, MD;  Location: [facility];  Service: Vascular;  Laterality: Left;   Family History  Problem Relation Age of Onset  . Hypertension Other   . Cancer Other   . Cancer Sister   . Deep vein thrombosis Brother   . Heart disease Brother   . Deep vein thrombosis Brother   . Heart disease Brother   . Cancer Sister    History  Substance Use Topics  . Smoking status: Former Smoker -- 1.00 packs/day for 20 years    Types: Cigarettes    Quit date: 07/29/1991  . Smokeless tobacco: Former Neurosurgeon    Types: Tree surgeon  date: 08/06/1987  . Alcohol Use: No     Comment: occasionallly    Review of Systems  Unable to perform ROS     Allergies  Contrast media  Home Medications   Prior to Admission medications   Medication Sig Start Date End Date Taking? Authorizing Provider  allopurinol (ZYLOPRIM) 300 MG tablet Take 1 tablet (300 mg total) by mouth every morning. 09/08/13  Yes Daniel J Angiulli, PA-C  ALPRAZolam Prudy Feeler(XANAX) 1 MG tablet Take 1 mg by mouth 3 (three) times daily.    Yes Historical Provider, MD  aspirin 81 MG EC tablet Take 1 tablet (81 mg total) by mouth daily. Patient taking differently: Take 81 mg by mouth daily with breakfast.  09/08/13  Yes Daniel J Angiulli, PA-C  atenolol (TENORMIN) 50 MG tablet Take 1 tablet (50 mg total) by mouth 2 (two) times daily. 09/08/13  Yes Daniel J Angiulli, PA-C  brimonidine-timolol (COMBIGAN) 0.2-0.5 % ophthalmic solution Place 1 drop into both eyes every 12 (twelve) hours.    Yes Historical Provider, MD  clopidogrel (PLAVIX) 75 MG tablet Take 1 tablet (75 mg total) by mouth every morning. 09/08/13  Yes Daniel J  Angiulli, PA-C  diclofenac sodium (VOLTAREN) 1 % GEL Apply 1 application topically 4 (four) times daily as needed. Applies to both knees as needed for pain 09/08/13  Yes Daniel J Angiulli, PA-C  furosemide (LASIX) 80 MG tablet Take 1 tablet (80 mg total) by mouth every morning. 09/08/13  Yes Daniel J Angiulli, PA-C  glipiZIDE (GLUCOTROL XL) 5 MG 24 hr tablet Take 5 mg by mouth 2 (two) times daily.   Yes Historical Provider, MD  HYDROcodone-acetaminophen (NORCO) 10-325 MG per tablet Take 1 tablet by mouth 3 (three) times daily.   Yes Historical Provider, MD  Influenza Vac Split Quad (FLUZONE) 0.25 ML injection Inject 0.25 mLs into the muscle once.   Yes Historical Provider, MD  insulin glargine (LANTUS) 100 UNIT/ML injection Inject 0.05 mLs (5 Units total) into the skin daily. Patient taking differently: Inject 5-30 Units into the skin daily as needed (for blood sugar). On sliding scale 09/08/13  Yes Daniel J Angiulli, PA-C  isosorbide mononitrate (IMDUR) 30 MG 24 hr tablet Take 1 tablet (30 mg total) by mouth every morning. 09/08/13  Yes Daniel J Angiulli, PA-C  latanoprost (XALATAN) 0.005 % ophthalmic solution Place 1 drop into both eyes at bedtime.     Yes Historical Provider, MD  magnesium oxide (MAG-OX) 400 MG tablet Take 400 mg by mouth daily with breakfast.   Yes Historical Provider, MD  nitroGLYCERIN (NITROSTAT) 0.4 MG SL tablet Place 1 tablet (0.4 mg total) under the tongue every 5 (five) minutes as needed. For chest pain 09/08/13  Yes Daniel J Angiulli, PA-C  pantoprazole (PROTONIX) 40 MG tablet Take 40 mg by mouth 2 (two) times daily before a meal.   Yes Historical Provider, MD  perphenazine (TRILAFON) 8 MG tablet Take 16 mg by mouth at bedtime.  10/22/13  Yes Historical Provider, MD  pneumococcal 13-valent conjugate vaccine (PREVNAR 13) SUSP injection Inject 0.5 mLs into the muscle once.   Yes Historical Provider, MD  potassium chloride SA (K-DUR,KLOR-CON) 20 MEQ tablet Take 2 tablets (40 mEq total)  by mouth 2 (two) times daily. 09/08/13  Yes Daniel J Angiulli, PA-C  risperiDONE (RISPERDAL) 0.5 MG tablet Take 1 mg by mouth at bedtime.   Yes Historical Provider, MD  simvastatin (ZOCOR) 20 MG tablet Take 1 tablet (20 mg total) by mouth every evening.  09/08/13  Yes Daniel J Angiulli, PA-C  tamsulosin (FLOMAX) 0.4 MG CAPS capsule Take 2 capsules (0.8 mg total) by mouth at bedtime. 09/08/13  Yes Daniel J Angiulli, PA-C  temazepam (RESTORIL) 30 MG capsule Take 30 mg by mouth at bedtime.    Yes Historical Provider, MD  oxyCODONE-acetaminophen (PERCOCET) 10-325 MG per tablet Take 1 tablet by mouth 2 (two) times daily. Patient not taking: Reported on 07/03/2014 05/30/14   Jacalyn LefevreEunice Thomas, NP   BP 133/58 mmHg  Pulse 68  Temp(Src) 97.6 F (36.4 C) (Oral)  Resp 18  Ht 5\' 10"  (1.778 m)  Wt 236 lb 6.4 oz (107.23 kg)  BMI 33.92 kg/m2  SpO2 94% Physical Exam  Constitutional: He appears well-developed and well-nourished. No distress.  HENT:  Head: Normocephalic and atraumatic.  Eyes: Pupils are equal, round, and reactive to light.  Neck: Normal range of motion.  Cardiovascular: Normal rate and intact distal pulses.   Pulmonary/Chest: No respiratory distress. He has no wheezes. He has no rales.  Abdominal: Normal appearance. He exhibits no distension. There is no tenderness. There is no rebound and no guarding.  Musculoskeletal: Normal range of motion.  Neurological: He is alert. No cranial nerve deficit.  Skin: Skin is warm and dry. No rash noted.  Psychiatric: Thought content is paranoid and delusional.  Nursing note and vitals reviewed.   ED Course  Procedures (including critical care time) Labs Review Labs Reviewed  URINALYSIS, ROUTINE W REFLEX MICROSCOPIC - Abnormal; Notable for the following:    Protein, ur 30 (*)    Nitrite POSITIVE (*)    All other components within normal limits  CBC WITH DIFFERENTIAL - Abnormal; Notable for the following:    Hemoglobin 12.2 (*)    MCH 25.4 (*)    RDW  16.7 (*)    All other components within normal limits  COMPREHENSIVE METABOLIC PANEL - Abnormal; Notable for the following:    Glucose, Bld 67 (*)    Albumin 3.0 (*)    GFR calc non Af Amer 62 (*)    GFR calc Af Amer 72 (*)    All other components within normal limits  URINE MICROSCOPIC-ADD ON - Abnormal; Notable for the following:    Bacteria, UA MANY (*)    All other components within normal limits  CBC - Abnormal; Notable for the following:    RDW 16.9 (*)    All other components within normal limits  COMPREHENSIVE METABOLIC PANEL - Abnormal; Notable for the following:    Glucose, Bld 109 (*)    GFR calc non Af Amer 55 (*)    GFR calc Af Amer 64 (*)    All other components within normal limits  CBG MONITORING, ED - Abnormal; Notable for the following:    Glucose-Capillary 65 (*)    All other components within normal limits  CBG MONITORING, ED - Abnormal; Notable for the following:    Glucose-Capillary 69 (*)    All other components within normal limits  URINE CULTURE  AMMONIA  LACTIC ACID, PLASMA  PROTIME-INR  TSH  VITAMIN B12  VITAMIN B1  RPR  BASIC METABOLIC PANEL  CBC  CBG MONITORING, ED  CBG MONITORING, ED    Imaging Review Dg Chest 2 View  07/04/2014   CLINICAL DATA:  Acute encephalopathy.  Weakness.  EXAM: CHEST  2 VIEW  COMPARISON:  08/26/2013  FINDINGS: Mild enlargement of the heart is unchanged. 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 Lung markings are mildly prominent but unchanged.  No focal airspace disease. No significant pulmonary edema. Trachea is midline. Mild blunting at the right costophrenic angle appears chronic. Degenerative changes in the thoracic spine without large pleural effusions. Small retrocardiac density is compatible with a hiatal hernia.a  IMPRESSION: No acute chest findings.   Electronically Signed   By: Richarda Overlie M.D.   On: 07/04/2014 15:48   Ct Head Wo Contrast  07/03/2014   CLINICAL DATA:  Altered mental status  EXAM: CT HEAD WITHOUT  CONTRAST  TECHNIQUE: Contiguous axial images were obtained from the base of the skull through the vertex without intravenous contrast.  COMPARISON:  CT head 05/30/2013  FINDINGS: Age-appropriate atrophy.  Negative for hydrocephalus  Mild chronic microvascular ischemic changes in the white matter. Negative for acute infarct. Negative for hemorrhage or mass  Prior mastoidectomy on the right. Left mastoid sinus effusion and middle ear effusion no acute bony abnormality.  IMPRESSION: Mild atrophy and mild chronic microvascular ischemic change in the white matter. No acute abnormality.  Left middle ear and mastoid sinus effusion.   Electronically Signed   By: Marlan Palau M.D.   On: 07/03/2014 13:38      MDM   Final diagnoses:  Altered mental status  Psychosis Hallucinations      Nelia Shi, MD 07/04/14 1907

## 2014-07-03 NOTE — ED Notes (Signed)
His wife called EMS r/t pt. Having increased periods of confusion with some hallucinating; i.e. He saw 2 "critters" this morning he asked paramedics to help him "get rid of"; and they confirm that nothing was there.
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
## 2014-07-04 ENCOUNTER — Inpatient Hospital Stay (HOSPITAL_COMMUNITY): Payer: Medicare Other

## 2014-07-04 ENCOUNTER — Encounter (HOSPITAL_COMMUNITY): Payer: Self-pay

## 2014-07-04 DIAGNOSIS — I5032 Chronic diastolic (congestive) heart failure: Secondary | ICD-10-CM

## 2014-07-04 DIAGNOSIS — E1121 Type 2 diabetes mellitus with diabetic nephropathy: Secondary | ICD-10-CM | POA: Diagnosis present

## 2014-07-04 DIAGNOSIS — R443 Hallucinations, unspecified: Secondary | ICD-10-CM | POA: Insufficient documentation

## 2014-07-04 DIAGNOSIS — N289 Disorder of kidney and ureter, unspecified: Secondary | ICD-10-CM | POA: Diagnosis present

## 2014-07-04 DIAGNOSIS — K219 Gastro-esophageal reflux disease without esophagitis: Secondary | ICD-10-CM | POA: Diagnosis present

## 2014-07-04 DIAGNOSIS — E1140 Type 2 diabetes mellitus with diabetic neuropathy, unspecified: Secondary | ICD-10-CM | POA: Diagnosis present

## 2014-07-04 DIAGNOSIS — Z794 Long term (current) use of insulin: Secondary | ICD-10-CM | POA: Diagnosis not present

## 2014-07-04 DIAGNOSIS — G9340 Encephalopathy, unspecified: Secondary | ICD-10-CM | POA: Diagnosis present

## 2014-07-04 DIAGNOSIS — H409 Unspecified glaucoma: Secondary | ICD-10-CM | POA: Diagnosis present

## 2014-07-04 DIAGNOSIS — Z87891 Personal history of nicotine dependence: Secondary | ICD-10-CM | POA: Diagnosis not present

## 2014-07-04 DIAGNOSIS — N400 Enlarged prostate without lower urinary tract symptoms: Secondary | ICD-10-CM | POA: Diagnosis present

## 2014-07-04 DIAGNOSIS — Z66 Do not resuscitate: Secondary | ICD-10-CM | POA: Diagnosis present

## 2014-07-04 DIAGNOSIS — L89154 Pressure ulcer of sacral region, stage 4: Secondary | ICD-10-CM | POA: Diagnosis present

## 2014-07-04 DIAGNOSIS — G4730 Sleep apnea, unspecified: Secondary | ICD-10-CM | POA: Diagnosis present

## 2014-07-04 DIAGNOSIS — N390 Urinary tract infection, site not specified: Secondary | ICD-10-CM | POA: Diagnosis present

## 2014-07-04 DIAGNOSIS — E1165 Type 2 diabetes mellitus with hyperglycemia: Secondary | ICD-10-CM | POA: Diagnosis present

## 2014-07-04 DIAGNOSIS — I872 Venous insufficiency (chronic) (peripheral): Secondary | ICD-10-CM | POA: Diagnosis present

## 2014-07-04 DIAGNOSIS — E785 Hyperlipidemia, unspecified: Secondary | ICD-10-CM | POA: Diagnosis present

## 2014-07-04 DIAGNOSIS — Z6832 Body mass index (BMI) 32.0-32.9, adult: Secondary | ICD-10-CM | POA: Diagnosis not present

## 2014-07-04 DIAGNOSIS — I10 Essential (primary) hypertension: Secondary | ICD-10-CM | POA: Diagnosis present

## 2014-07-04 DIAGNOSIS — Z7982 Long term (current) use of aspirin: Secondary | ICD-10-CM | POA: Diagnosis not present

## 2014-07-04 DIAGNOSIS — Z89612 Acquired absence of left leg above knee: Secondary | ICD-10-CM | POA: Diagnosis not present

## 2014-07-04 DIAGNOSIS — B9689 Other specified bacterial agents as the cause of diseases classified elsewhere: Secondary | ICD-10-CM | POA: Diagnosis present

## 2014-07-04 DIAGNOSIS — I2510 Atherosclerotic heart disease of native coronary artery without angina pectoris: Secondary | ICD-10-CM | POA: Diagnosis present

## 2014-07-04 DIAGNOSIS — Z951 Presence of aortocoronary bypass graft: Secondary | ICD-10-CM | POA: Diagnosis not present

## 2014-07-04 DIAGNOSIS — E11649 Type 2 diabetes mellitus with hypoglycemia without coma: Secondary | ICD-10-CM | POA: Diagnosis present

## 2014-07-04 DIAGNOSIS — F329 Major depressive disorder, single episode, unspecified: Secondary | ICD-10-CM | POA: Diagnosis present

## 2014-07-04 DIAGNOSIS — Z79899 Other long term (current) drug therapy: Secondary | ICD-10-CM | POA: Diagnosis not present

## 2014-07-04 DIAGNOSIS — Z8673 Personal history of transient ischemic attack (TIA), and cerebral infarction without residual deficits: Secondary | ICD-10-CM | POA: Diagnosis not present

## 2014-07-04 DIAGNOSIS — M109 Gout, unspecified: Secondary | ICD-10-CM | POA: Diagnosis present

## 2014-07-04 DIAGNOSIS — I739 Peripheral vascular disease, unspecified: Secondary | ICD-10-CM | POA: Diagnosis present

## 2014-07-04 LAB — COMPREHENSIVE METABOLIC PANEL
ALBUMIN: 3.5 g/dL (ref 3.5–5.2)
ALK PHOS: 92 U/L (ref 39–117)
ALT: 6 U/L (ref 0–53)
ANION GAP: 15 (ref 5–15)
AST: 11 U/L (ref 0–37)
BUN: 14 mg/dL (ref 6–23)
CO2: 29 mEq/L (ref 19–32)
Calcium: 9.9 mg/dL (ref 8.4–10.5)
Chloride: 101 mEq/L (ref 96–112)
Creatinine, Ser: 1.26 mg/dL (ref 0.50–1.35)
GFR calc Af Amer: 64 mL/min — ABNORMAL LOW (ref >90)
GFR calc non Af Amer: 55 mL/min — ABNORMAL LOW (ref >90)
Glucose, Bld: 109 mg/dL — ABNORMAL HIGH (ref 70–99)
POTASSIUM: 4.6 meq/L (ref 3.7–5.3)
Sodium: 145 mEq/L (ref 137–147)
TOTAL PROTEIN: 7.3 g/dL (ref 6.0–8.3)
Total Bilirubin: 0.8 mg/dL (ref 0.3–1.2)

## 2014-07-04 LAB — CBC
HCT: 46.4 % (ref 39.0–52.0)
Hemoglobin: 14.5 g/dL (ref 13.0–17.0)
MCH: 26.2 pg (ref 26.0–34.0)
MCHC: 31.3 g/dL (ref 30.0–36.0)
MCV: 83.9 fL (ref 78.0–100.0)
Platelets: 233 10*3/uL (ref 150–400)
RBC: 5.53 MIL/uL (ref 4.22–5.81)
RDW: 16.9 % — AB (ref 11.5–15.5)
WBC: 7.9 10*3/uL (ref 4.0–10.5)

## 2014-07-04 LAB — GLUCOSE, CAPILLARY
Glucose-Capillary: 93 mg/dL (ref 70–99)
Glucose-Capillary: 113 mg/dL — ABNORMAL HIGH (ref 70–99)

## 2014-07-04 LAB — CBG MONITORING, ED: GLUCOSE-CAPILLARY: 92 mg/dL (ref 70–99)

## 2014-07-04 LAB — TSH: TSH: 0.897 u[IU]/mL (ref 0.350–4.500)

## 2014-07-04 LAB — RPR

## 2014-07-04 MED ORDER — ONDANSETRON HCL 4 MG PO TABS: 4.0000 mg | ORAL_TABLET | Freq: Four times a day (QID) | ORAL | Status: DC | PRN

## 2014-07-04 MED ORDER — DEXTROSE 5 % IV SOLN
1.0000 g | INTRAVENOUS | Status: DC
  Administered 2014-07-04 – 2014-07-07 (×4): 1 g via INTRAVENOUS
  Filled 2014-07-04 (×6): qty 10

## 2014-07-04 MED ORDER — LORAZEPAM 1 MG PO TABS
2.0000 mg | ORAL_TABLET | Freq: Once | ORAL | Status: AC
  Administered 2014-07-04: 2 mg via ORAL
  Filled 2014-07-04: qty 2

## 2014-07-04 MED ORDER — ZIPRASIDONE MESYLATE 20 MG IM SOLR
10.0000 mg | Freq: Once | INTRAMUSCULAR | Status: AC
Start: 2014-07-04 — End: 2014-07-04
  Administered 2014-07-04: 10 mg via INTRAMUSCULAR
  Filled 2014-07-04: qty 20

## 2014-07-04 MED ORDER — ENOXAPARIN SODIUM 40 MG/0.4ML ~~LOC~~ SOLN
40.0000 mg | SUBCUTANEOUS | Status: DC
  Administered 2014-07-04 – 2014-07-07 (×4): 40 mg via SUBCUTANEOUS
  Filled 2014-07-04 (×5): qty 0.4

## 2014-07-04 MED ORDER — GLIPIZIDE ER 5 MG PO TB24
5.0000 mg | ORAL_TABLET | Freq: Two times a day (BID) | ORAL | Status: DC
  Filled 2014-07-04 (×2): qty 1

## 2014-07-04 MED ORDER — SODIUM CHLORIDE 0.9 % IJ SOLN
3.0000 mL | Freq: Two times a day (BID) | INTRAMUSCULAR | Status: DC
  Administered 2014-07-05 – 2014-07-08 (×3): 3 mL via INTRAVENOUS

## 2014-07-04 MED ORDER — SODIUM CHLORIDE 0.9 % IJ SOLN: 3.0000 mL | INTRAMUSCULAR | Status: DC | PRN

## 2014-07-04 MED ORDER — ONDANSETRON HCL 4 MG/2ML IJ SOLN
4.0000 mg | Freq: Four times a day (QID) | INTRAMUSCULAR | Status: DC | PRN
  Administered 2014-07-06: 4 mg via INTRAVENOUS
  Filled 2014-07-04: qty 2

## 2014-07-04 MED ORDER — DEXTROSE 5 % IV SOLN: 1.0000 g | INTRAVENOUS | Status: DC

## 2014-07-04 MED ORDER — POTASSIUM CHLORIDE CRYS ER 20 MEQ PO TBCR
40.0000 meq | EXTENDED_RELEASE_TABLET | Freq: Every day | ORAL | Status: DC
  Administered 2014-07-05 – 2014-07-08 (×4): 40 meq via ORAL
  Filled 2014-07-04 (×4): qty 2

## 2014-07-04 MED ORDER — SODIUM CHLORIDE 0.9 % IV SOLN: 250.0000 mL | INTRAVENOUS | Status: DC | PRN

## 2014-07-04 MED ORDER — HALOPERIDOL LACTATE 5 MG/ML IJ SOLN
1.0000 mg | Freq: Once | INTRAMUSCULAR | Status: AC
  Administered 2014-07-04: 1 mg via INTRAVENOUS
  Filled 2014-07-04: qty 1

## 2014-07-04 MED ORDER — MAGNESIUM OXIDE 400 (241.3 MG) MG PO TABS
400.0000 mg | ORAL_TABLET | Freq: Every day | ORAL | Status: DC
  Administered 2014-07-05 – 2014-07-08 (×4): 400 mg via ORAL
  Filled 2014-07-04 (×6): qty 1

## 2014-07-04 MED ORDER — CIPROFLOXACIN HCL 500 MG PO TABS
500.0000 mg | ORAL_TABLET | Freq: Two times a day (BID) | ORAL | Status: DC
  Administered 2014-07-04: 500 mg via ORAL
  Filled 2014-07-04: qty 1

## 2014-07-04 MED ORDER — LORAZEPAM 2 MG/ML IJ SOLN
1.0000 mg | Freq: Once | INTRAMUSCULAR | Status: AC
  Administered 2014-07-04: 1 mg via INTRAVENOUS
  Filled 2014-07-04: qty 1

## 2014-07-04 MED ORDER — DEXTROSE 5 % IV SOLN: 1.0000 g | Freq: Once | INTRAVENOUS | Status: DC

## 2014-07-04 NOTE — ED Notes (Signed)
[facility] after being asked not to shout. Noticed pt having some dyspnea and attempting to get out of bed. Pt was redirect pt multiple times to stay in the bed. Asked pt if a set of vitals could be taken, paying attention to his oxygen saturation. Pt became very irritable and snatched of the finger probe off his finger and refused to have monitoring per dinamap. Both verbal and physical aggression was translated by pt to this nurse and other staff. Pt was given Geodon 10mg  with assistance of security to help watch pt hands. Pt will continue to be monitored by staff.

## 2014-07-04 NOTE — ED Notes (Signed)
Pt now asleep. Since administering injection of Geodon, pt remained to hallucinate, yell out/shout, and climb out of bed. Pt was redirected and asked to be assisted several times by charge nurse, NT and Cna. Pt also voided on the floor and resisted help to change his linen on his bed. He finally was able to focus and cooperate when morning security arrived to bedside after being called for shift change. Pt followed commands ( to keep legs in the bed, allow staff to dry and change bedding) for security officer.

## 2014-07-04 NOTE — Progress Notes (Signed)
Pt arrived soaking wet through sheets, diaper, pads, changed cleaned and put condom cath on.

## 2014-07-04 NOTE — H&P (Signed)
[facility] History and Physical  Nicholas CooterJames D Kawamoto WUJ:[id number] DOB: [birth year] DOA: 07/03/2014  Referring physician: Dr Gwendolyn GrantWalden.  PCP: Pearla DubonnetGATES,ROBERT NEVILL, MD   Chief Complaint: Agitation, Hallucination.   HPI: Nicholas [name] is a 73 y.o. male with PMH significant for Diabetes, chronic venous insufficiency, morbid obesity, HTN, stroke, Diabetes, who was brought to ED by Police officer 11-29 because patient has been having AMS, agitation. He has been calling the police multiple times, 4 times. The police took fire arm from him. Per wife patient has been agitated for last month, having hallucinations. This has been getting worse recently. Patient denies chest pain , dyspnea, headaches. He does relates that he has been seeing a man head on the ceiling of the room. Things crawling.  No diarrhea, no abdominal pain. Patient has been agitated. He improved after he received geodon.   Evaluation in ED: UA with positive nitrates, urine growing 100,000 gram negatives, he has had some hypoglycemia episodes in the ED.   Review of Systems:  Negative, except as per HPI.   Past Medical History  Diagnosis Date  . Hypertension   . Peripheral vascular disease   . Ulcer     left foot  . Dyslipidemia   . Chronic venous insufficiency   . Sleep apnea   . Morbid obesity   . Diverticulosis   . Benign prostatic hypertrophy   . Glaucoma   . Gout   . Depression   . Insomnia   . CAD (coronary artery disease), native coronary artery   . GERD (gastroesophageal reflux disease)   . Angina   . Pneumonia 12/11/11  . Type II diabetes mellitus   . Chronic daily headache     "~ all the time"  . Seizures   . Stroke ~ 1983    "little weaker right hand"  . MRSA (methicillin resistant staph aureus) culture positive Dec. 17, 2013     right heel infection   Past Surgical History  Procedure Laterality Date  . Appendectomy    . Lumbar laminectomy    . Mastoid debridement    . Knee and ankle surgery   1979    "caved in in a ditch; messed up legs"; right  . Cholecystectomy    . Back surgery      "5 or 6"  . Peripheral arterial stent graft      "don't remember which side"  . Coronary artery bypass graft  1983; 1992    "CABG X 5; CABG X 8"  . Lower extremity angiogram  01/21/2012    Procedure: LOWER EXTREMITY ANGIOGRAM;  Surgeon: Nada LibmanVance W Brabham, MD;  [facility];  Service: Vascular;  Laterality: Bilateral;  WITH POSSIBLE INTERVENTION  . Eye surgery      "laser; both eyes"  . Eye surgery  05/08/2012    Cataract Left eye  . Heel bx  Dec. 17, 2013    Right Heel Bx.    . Transluminal atherectomy popliteal artery  01/21/2012    Left  . Amputation Left 08/27/2013    Procedure: LEFT ABOVE THE KNEE AMPUTATION ;  Surgeon: Pryor OchoaJames D Lawson, MD;  [facility];  Service: Vascular;  Laterality: Left;   Social History:  reports that he quit smoking about 22 years ago. His smoking use included Cigarettes. He has a 20 pack-year smoking history. He quit smokeless tobacco use about 26 years ago. His smokeless tobacco use included Chew. He reports that he does not drink alcohol or use illicit drugs.  Allergies  Allergen Reactions  . Contrast Media [Iodinated Diagnostic Agents] Other (See Comments)    Shaking and feeling terrible    Family History  Problem Relation Age of Onset  . Hypertension Other   . Cancer Other   . Cancer Sister   . Deep vein thrombosis Brother   . Heart disease Brother   . Deep vein thrombosis Brother   . Heart disease Brother   . Cancer Sister      Prior to Admission medications   Medication Sig Start Date End Date Taking? Authorizing Provider  allopurinol (ZYLOPRIM) 300 MG tablet Take 1 tablet (300 mg total) by mouth every morning. 09/08/13  Yes Daniel J Angiulli, PA-C  ALPRAZolam Prudy Feeler(XANAX) 1 MG tablet Take 1 mg by mouth 3 (three) times daily.    Yes Historical Provider, MD  aspirin 81 MG EC tablet Take 1 tablet (81 mg total) by mouth daily. Patient taking  differently: Take 81 mg by mouth daily with breakfast.  09/08/13  Yes Daniel J Angiulli, PA-C  atenolol (TENORMIN) 50 MG tablet Take 1 tablet (50 mg total) by mouth 2 (two) times daily. 09/08/13  Yes Daniel J Angiulli, PA-C  brimonidine-timolol (COMBIGAN) 0.2-0.5 % ophthalmic solution Place 1 drop into both eyes every 12 (twelve) hours.    Yes Historical Provider, MD  clopidogrel (PLAVIX) 75 MG tablet Take 1 tablet (75 mg total) by mouth every morning. 09/08/13  Yes Daniel J Angiulli, PA-C  diclofenac sodium (VOLTAREN) 1 % GEL Apply 1 application topically 4 (four) times daily as needed. Applies to both knees as needed for pain 09/08/13  Yes Daniel J Angiulli, PA-C  furosemide (LASIX) 80 MG tablet Take 1 tablet (80 mg total) by mouth every morning. 09/08/13  Yes Daniel J Angiulli, PA-C  glipiZIDE (GLUCOTROL XL) 5 MG 24 hr tablet Take 5 mg by mouth 2 (two) times daily.   Yes Historical Provider, MD  HYDROcodone-acetaminophen (NORCO) 10-325 MG per tablet Take 1 tablet by mouth 3 (three) times daily.   Yes Historical Provider, MD  Influenza Vac Split Quad (FLUZONE) 0.25 ML injection Inject 0.25 mLs into the muscle once.   Yes Historical Provider, MD  insulin glargine (LANTUS) 100 UNIT/ML injection Inject 0.05 mLs (5 Units total) into the skin daily. Patient taking differently: Inject 5-30 Units into the skin daily as needed (for blood sugar). On sliding scale 09/08/13  Yes Daniel J Angiulli, PA-C  isosorbide mononitrate (IMDUR) 30 MG 24 hr tablet Take 1 tablet (30 mg total) by mouth every morning. 09/08/13  Yes Daniel J Angiulli, PA-C  latanoprost (XALATAN) 0.005 % ophthalmic solution Place 1 drop into both eyes at bedtime.     Yes Historical Provider, MD  magnesium oxide (MAG-OX) 400 MG tablet Take 400 mg by mouth daily with breakfast.   Yes Historical Provider, MD  nitroGLYCERIN (NITROSTAT) 0.4 MG SL tablet Place 1 tablet (0.4 mg total) under the tongue every 5 (five) minutes as needed. For chest pain 09/08/13  Yes  Daniel J Angiulli, PA-C  pantoprazole (PROTONIX) 40 MG tablet Take 40 mg by mouth 2 (two) times daily before a meal.   Yes Historical Provider, MD  perphenazine (TRILAFON) 8 MG tablet Take 16 mg by mouth at bedtime.  10/22/13  Yes Historical Provider, MD  pneumococcal 13-valent conjugate vaccine (PREVNAR 13) SUSP injection Inject 0.5 mLs into the muscle once.   Yes Historical Provider, MD  potassium chloride SA (K-DUR,KLOR-CON) 20 MEQ tablet Take 2 tablets (40 mEq total) by mouth 2 (  two) times daily. 09/08/13  Yes Daniel J Angiulli, PA-C  risperiDONE (RISPERDAL) 0.5 MG tablet Take 1 mg by mouth at bedtime.   Yes Historical Provider, MD  simvastatin (ZOCOR) 20 MG tablet Take 1 tablet (20 mg total) by mouth every evening. 09/08/13  Yes Daniel J Angiulli, PA-C  tamsulosin (FLOMAX) 0.4 MG CAPS capsule Take 2 capsules (0.8 mg total) by mouth at bedtime. 09/08/13  Yes Daniel J Angiulli, PA-C  temazepam (RESTORIL) 30 MG capsule Take 30 mg by mouth at bedtime.    Yes Historical Provider, MD  oxyCODONE-acetaminophen (PERCOCET) 10-325 MG per tablet Take 1 tablet by mouth 2 (two) times daily. Patient not taking: Reported on 07/03/2014 05/30/14   Jacalyn Lefevre, NP   Physical Exam: Filed Vitals:   07/03/14 1356 07/03/14 1749 07/03/14 2328 07/04/14 0301  BP: 154/42 162/55 172/53 162/72  Pulse: 55 57 56 58  Temp: 97.7 F (36.5 C) 98.6 F (37 C) 99.1 F (37.3 C)   TempSrc: Oral Oral Oral   Resp: 16 18 16    SpO2: 99% 94% 92% 92%    Wt Readings from Last 3 Encounters:  09/28/13 104.781 kg (231 lb)  09/08/13 105.1 kg (231 lb 11.3 oz)  08/30/13 112.8 kg (248 lb 10.9 oz)    General:  Appears calm and comfortable Eyes: PERRL, normal lids, irises & conjunctiva ENT: grossly normal hearing, lips & tongue Neck: no LAD, masses or thyromegaly Cardiovascular: RRR, no m/r/g. Right lower extremity with edema, no significant redness. Left AKA.  Telemetry: SR, no arrhythmias  Respiratory: CTA bilaterally, no w/r/r.  Normal respiratory effort. Abdomen: soft, ntnd Skin: dry skin, toes with chronic wound.  Musculoskeletal: grossly normal tone BUE/BLE Psychiatric: Patient report visual hallucination.  Neurologic: grossly non-focal.          Labs on Admission:  Basic Metabolic Panel:  Recent Labs Lab 07/03/14 1021  NA 139  K 4.4  CL 102  CO2 26  GLUCOSE 67*  BUN 14  CREATININE 1.14  CALCIUM 9.1   Liver Function Tests:  Recent Labs Lab 07/03/14 1021  AST 11  ALT 5  ALKPHOS 77  BILITOT 0.3  PROT 6.3  ALBUMIN 3.0*   No results for input(s): LIPASE, AMYLASE in the last 168 hours.  Recent Labs Lab 07/03/14 1021  AMMONIA 21   CBC:  Recent Labs Lab 07/03/14 1021  WBC 8.7  NEUTROABS 4.5  HGB 12.2*  HCT 40.3  MCV 83.8  PLT 211   Cardiac Enzymes: No results for input(s): CKTOTAL, CKMB, CKMBINDEX, TROPONINI in the last 168 hours.  BNP (last 3 results)  Recent Labs  08/26/13 1130  PROBNP 918.5*   CBG:  Recent Labs Lab 07/03/14 1625 07/03/14 2321 07/03/14 2357 07/04/14 0559  GLUCAP 65* 69* 72 92    Radiological Exams on Admission: Ct Head Wo Contrast  07/03/2014   CLINICAL DATA:  Altered mental status  EXAM: CT HEAD WITHOUT CONTRAST  TECHNIQUE: Contiguous axial images were obtained from the base of the skull through the vertex without intravenous contrast.  COMPARISON:  CT head 05/30/2013  FINDINGS: Age-appropriate atrophy.  Negative for hydrocephalus  Mild chronic microvascular ischemic changes in the white matter. Negative for acute infarct. Negative for hemorrhage or mass  Prior mastoidectomy on the right. Left mastoid sinus effusion and middle ear effusion no acute bony abnormality.  IMPRESSION: Mild atrophy and mild chronic microvascular ischemic change in the white matter. No acute abnormality.  Left middle ear and mastoid sinus effusion.   Electronically  Signed   By: Marlan Palau M.D.   On: 07/03/2014 13:38    EKG: ordered.   Assessment/Plan Active  Problems:   CAD (coronary artery disease), native coronary artery   DNR (do not resuscitate)   Chronic diastolic heart failure   Type 2 diabetes mellitus with diabetic nephropathy   UTI (lower urinary tract infection)   Encephalopathy acute  1-Encephalopathy;  This could be secondary to infection process, vs psychiatry disorder.  Patient is calm, eating lunch, he does report hallucinations.  Admit to hospital for IV antibiotics, follow urine culture.  Patient had 2 episodes of hypoglycemia, this can also contributes.  Will check TSH, B-12, Thiamine level, RPR.  Will order MRI and EEG.  Psych evaluated patient, continue to follow.  PRN ativan.  On Remeron, temazepam,   2-Diabetes, with hypoglycemia; hold glipizide. Will order SSI, would not cover blood sugar less than 150.   3-HTN, CAD; Continue with lasix, plavix, atenolol.   4-UTI; continue with ceftriaxone; follow urine culture.   5-Chronic Diastolic Heart Failure; continue with lasix. Appears compensated.   6-Right LE edema; check doppler.     Code Status: DNR DVT Prophylaxis:Lovenox.  Family Communication: Care discussed with wife.  Disposition Plan: expect 3 to 4 days.   Time spent: 75 minutes.   Hartley Barefoot A [facility] Pager [phone]

## 2014-07-04 NOTE — ED Provider Notes (Signed)
1130 - Psych feels patient's underlying medical conditions are causing him to have worsening of his psychiatric conditions. Will treat UTI with IV rocephin and have him admitted. Psych asked to place note in the computer detailing their findings. They were also concerned about a foot infection - I see a mild blister on the top of his R 2nd toe without any surrounding cellulitis.  Dr. Sunnie Nielsenegalado with [facility] admitting.  1. Altered mental status      Nicholas MochaBlair Latavious Bitter, MD 07/04/14 [phone]

## 2014-07-04 NOTE — Consult Note (Signed)
Nicholas [name]   Reason for [name]:  Hallucinations Referring Physician:  EDP  Nicholas [name] is an 73 y.o. male. Total Time spent with patient: 45 minutes  Assessment: AXIS I:  Hallucinations AXIS II:  Deferred AXIS III:   Past Medical History  Diagnosis Date  . Hypertension   . Peripheral vascular disease   . Ulcer     left foot  . Dyslipidemia   . Chronic venous insufficiency   . Sleep apnea   . Morbid obesity   . Diverticulosis   . Benign prostatic hypertrophy   . Glaucoma   . Gout   . Depression   . Insomnia   . CAD (coronary artery disease), native coronary artery   . GERD (gastroesophageal reflux disease)   . Angina   . Pneumonia 12/11/11  . Type II diabetes mellitus   . Chronic daily headache     "~ all the time"  . Seizures   . Stroke ~ 1983    "little weaker right hand"  . MRSA (methicillin resistant staph aureus) culture positive Dec. 17, 2013     right heel infection   AXIS IV:  other psychosocial or environmental problems, problems related to social environment and chronic medical issues AXIS V:  61-70 mild symptoms  Plan:  No evidence of imminent risk to self or others at present.  Suggest discontinuing the Trilafon, slow down titration over 6-8 weeks, rapid oral discontinuation may lead to rebound psychosis and worsening symptoms--suggest consulting pharmacist for taper instructions.  Subjective:   Nicholas [name] is a 73 y.o. male patient does not warrant psychiatric admission.  HPI:  The patient has a history of diabetes and had his left leg amputated, AKA, 6 months ago.  He also had a few family members die from diabetes after amputations, according to his son at this bedside.  The patient has been seeing ghosts and talking to people who aren't there for the past month and a half.  His son reports MRSA at times and his right foot has had issues--edematous, boggy, minimal to no sensation, and sores on his toes on assessment.  The  patient also has a UTI.  He has had no past history of psychiatry, hallucinations, or delusions.  This new acute onset is most likely associated with his multiple medical issues---UTI, possible MRSA, and right leg issues.  He is being released from the psychiatric point at this time.  His wife is "handicapped" according to son, both at his bedside. HPI Elements:   Location:  generalized. Quality:  acute. Severity:  mild. Timing:  intermittent. Duration:  one month and a half. Context:  chronic medical issues, infections.  Past Psychiatric History: Past Medical History  Diagnosis Date  . Hypertension   . Peripheral vascular disease   . Ulcer     left foot  . Dyslipidemia   . Chronic venous insufficiency   . Sleep apnea   . Morbid obesity   . Diverticulosis   . Benign prostatic hypertrophy   . Glaucoma   . Gout   . Depression   . Insomnia   . CAD (coronary artery disease), native coronary artery   . GERD (gastroesophageal reflux disease)   . Angina   . Pneumonia 12/11/11  . Type II diabetes mellitus   . Chronic daily headache     "~ all the time"  . Seizures   . Stroke ~ 1983    "little weaker right hand"  . MRSA (methicillin  resistant staph aureus) culture positive Dec. 17, 2013     right heel infection    reports that he quit smoking about 22 years ago. His smoking use included Cigarettes. He has a 20 pack-year smoking history. He quit smokeless tobacco use about 26 years ago. His smokeless tobacco use included Chew. He reports that he does not drink alcohol or use illicit drugs. Family History  Problem Relation Age of Onset  . Hypertension Other   . Cancer Other   . Cancer Sister   . Deep vein thrombosis Brother   . Heart disease Brother   . Deep vein thrombosis Brother   . Heart disease Brother   . Cancer Sister    Family History Substance Abuse: No Family Supports: Yes, List: (wife, son) Living Arrangements: Spouse/significant other Can pt return to current  living arrangement?: Yes   Allergies:   Allergies  Allergen Reactions  . Contrast Media [Iodinated Diagnostic Agents] Other (See Comments)    Shaking and feeling terrible    ACT Assessment Complete:  Yes:    Educational Status    Risk to Self: Risk to self with the past 6 months Suicidal Ideation: No-Not Currently/Within Last 6 Months Suicidal Intent: No-Not Currently/Within Last 6 Months Is patient at risk for suicide?: No Suicidal Plan?: No-Not Currently/Within Last 6 Months Access to Means: No What has been your use of drugs/alcohol within the last 12 months?: none Previous Attempts/Gestures: No Intentional Self Injurious Behavior: None Family Suicide History: Unknown Recent stressful life event(s): Conflict (Comment) Persecutory voices/beliefs?: No Depression: No Substance abuse history and/or treatment for substance abuse?: No  Risk to Others: Risk to Others within the past 6 months Homicidal Ideation: No-Not Currently/Within Last 6 Months Thoughts of Harm to Others: No-Not Currently Present/Within Last 6 Months Current Homicidal Intent: No-Not Currently/Within Last 6 Months Current Homicidal Plan: No-Not Currently/Within Last 6 Months Access to Homicidal Means: No History of harm to others?: No Assessment of Violence: None Noted Does patient have access to weapons?: No Criminal Charges Pending?: No  Abuse:    Prior Inpatient Therapy: Prior Inpatient Therapy Prior Inpatient Therapy: No  Prior Outpatient Therapy: Prior Outpatient Therapy Prior Outpatient Therapy: Yes Prior Therapy Facilty/Provider(s): Dr. Reece Levy Reason for Treatment: depression  Additional Information: Additional Information 1:1 In Past 12 Months?: No CIRT Risk: No Elopement Risk: No Does patient have medical clearance?: Yes                  Objective: Blood pressure 133/58, pulse 68, temperature 97.6 F (36.4 C), temperature source Oral, resp. rate 18, height $RemoveBe'5\' 10"'sfCAktBmb$  (1.778 m), SpO2  94 %.There is no weight on file to calculate BMI. Results for orders placed or performed during the hospital encounter of 07/03/14 (from the past 72 hour(s))  Urinalysis, Routine w reflex microscopic     Status: Abnormal   Collection Time: 07/03/14  9:54 AM  Result Value Ref Range   Color, Urine YELLOW YELLOW   APPearance CLEAR CLEAR   Specific Gravity, Urine 1.016 1.005 - 1.030   pH 5.0 5.0 - 8.0   Glucose, UA NEGATIVE NEGATIVE mg/dL   Hgb urine dipstick NEGATIVE NEGATIVE   Bilirubin Urine NEGATIVE NEGATIVE   Ketones, ur NEGATIVE NEGATIVE mg/dL   Protein, ur 30 (A) NEGATIVE mg/dL   Urobilinogen, UA 0.2 0.0 - 1.0 mg/dL   Nitrite POSITIVE (A) NEGATIVE   Leukocytes, UA NEGATIVE NEGATIVE  Urine culture     Status: None (Preliminary result)   Collection Time: 07/03/14  9:54 AM  Result Value Ref Range   Specimen Description URINE, CATHETERIZED    Special Requests Normal    Culture  Setup Time      07/03/2014 15:30 Performed at [place]      >=100,000 COLONIES/ML Performed at [place] Performed at [facility]    Report Status PENDING   Urine microscopic-add on     Status: Abnormal   Collection Time: 07/03/14  9:54 AM  Result Value Ref Range   Squamous Epithelial / LPF RARE RARE   WBC, UA 0-2 <3 WBC/hpf   RBC / HPF 0-2 <3 RBC/hpf   Bacteria, UA MANY (A) RARE  CBC WITH DIFFERENTIAL     Status: Abnormal   Collection Time: 07/03/14 10:21 AM  Result Value Ref Range   WBC 8.7 4.0 - 10.5 K/uL   RBC 4.81 4.22 - 5.81 MIL/uL   Hemoglobin 12.2 (L) 13.0 - 17.0 g/dL   HCT 40.3 39.0 - 52.0 %   MCV 83.8 78.0 - 100.0 fL   MCH 25.4 (L) 26.0 - 34.0 pg   MCHC 30.3 30.0 - 36.0 g/dL   RDW 16.7 (H) 11.5 - 15.5 %   Platelets 211 150 - 400 K/uL   Neutrophils Relative % 51 43 - 77 %   Neutro Abs 4.5 1.7 - 7.7 K/uL   Lymphocytes Relative 39 12 - 46 %   Lymphs Abs 3.4 0.7 - 4.0 K/uL   Monocytes Relative 5 3 -  12 %   Monocytes Absolute 0.4 0.1 - 1.0 K/uL   Eosinophils Relative 5 0 - 5 %   Eosinophils Absolute 0.4 0.0 - 0.7 K/uL   Basophils Relative 0 0 - 1 %   Basophils Absolute 0.0 0.0 - 0.1 K/uL  Ammonia     Status: None   Collection Time: 07/03/14 10:21 AM  Result Value Ref Range   Ammonia 21 11 - 60 umol/L  Lactic acid, plasma     Status: None   Collection Time: 07/03/14 10:21 AM  Result Value Ref Range   Lactic Acid, Venous 1.1 0.5 - 2.2 mmol/L  Protime-INR     Status: None   Collection Time: 07/03/14 10:21 AM  Result Value Ref Range   Prothrombin Time 13.5 11.6 - 15.2 seconds   INR 1.02 0.00 - 1.49  Comprehensive metabolic panel     Status: Abnormal   Collection Time: 07/03/14 10:21 AM  Result Value Ref Range   Sodium 139 137 - 147 mEq/L   Potassium 4.4 3.7 - 5.3 mEq/L   Chloride 102 96 - 112 mEq/L   CO2 26 19 - 32 mEq/L   Glucose, Bld 67 (L) 70 - 99 mg/dL   BUN 14 6 - 23 mg/dL   Creatinine, Ser 1.14 0.50 - 1.35 mg/dL   Calcium 9.1 8.4 - 10.5 mg/dL   Total Protein 6.3 6.0 - 8.3 g/dL   Albumin 3.0 (L) 3.5 - 5.2 g/dL   AST 11 0 - 37 U/L   ALT 5 0 - 53 U/L   Alkaline Phosphatase 77 39 - 117 U/L   Total Bilirubin 0.3 0.3 - 1.2 mg/dL   GFR calc non Af Amer 62 (L) >90 mL/min   GFR calc Af Amer 72 (L) >90 mL/min    Comment: (NOTE) The eGFR has been calculated using the CKD EPI equation. This calculation has not been validated  in all clinical situations. eGFR's persistently <90 mL/min signify possible Chronic Kidney Disease.    Anion gap 11 5 - 15  CBG monitoring, ED     Status: Abnormal   Collection Time: 07/03/14  4:25 PM  Result Value Ref Range   Glucose-Capillary 65 (L) 70 - 99 mg/dL  CBG monitoring, ED     Status: Abnormal   Collection Time: 07/03/14 11:21 PM  Result Value Ref Range   Glucose-Capillary 69 (L) 70 - 99 mg/dL   Comment 1 Documented in Chart    Comment 2 Notify RN   CBG monitoring, ED     Status: None   Collection Time: 07/03/14 11:57 PM  Result  Value Ref Range   Glucose-Capillary 72 70 - 99 mg/dL   Comment 1 Documented in Chart    Comment 2 Notify RN   CBG monitoring, ED     Status: None   Collection Time: 07/04/14  5:59 AM  Result Value Ref Range   Glucose-Capillary 92 70 - 99 mg/dL   Comment 1 Documented in Chart    Comment 2 Notify RN    Labs are reviewed and are pertinent for multiple medical issues..  Current Facility-Administered Medications  Medication Dose Route Frequency Provider Last Rate Last Dose  . 0.9 %  sodium chloride infusion  250 mL Intravenous PRN Belkys A Regalado, MD      . allopurinol (ZYLOPRIM) tablet 300 mg  300 mg Oral q morning - 10a Nelia Shi, MD   300 mg at 07/04/14 0959  . alum & mag hydroxide-simeth (MAALOX/MYLANTA) 200-200-20 MG/5ML suspension 30 mL  30 mL Oral PRN Nelia Shi, MD      . aspirin EC tablet 81 mg  81 mg Oral Daily Nelia Shi, MD   81 mg at 07/04/14 0959  . atenolol (TENORMIN) tablet 50 mg  50 mg Oral BID Nelia Shi, MD   50 mg at 07/04/14 0959  . brimonidine (ALPHAGAN) 0.2 % ophthalmic solution 1 drop  1 drop Both Eyes BID Nelia Shi, MD   1 drop at 07/04/14 1042   And  . timolol (TIMOPTIC) 0.5 % ophthalmic solution 1 drop  1 drop Both Eyes BID Nelia Shi, MD   1 drop at 07/04/14 1049  . cefTRIAXone (ROCEPHIN) 1 g in dextrose 5 % 50 mL IVPB  1 g Intravenous Q24H Belkys A Regalado, MD      . clopidogrel (PLAVIX) tablet 75 mg  75 mg Oral q morning - 10a Nelia Shi, MD   75 mg at 07/04/14 0959  . enoxaparin (LOVENOX) injection 40 mg  40 mg Subcutaneous Q24H Belkys A Regalado, MD      . furosemide (LASIX) tablet 80 mg  80 mg Oral q morning - 10a Nelia Shi, MD   80 mg at 07/04/14 1040  . isosorbide mononitrate (IMDUR) 24 hr tablet 30 mg  30 mg Oral q morning - 10a Nelia Shi, MD   30 mg at 07/04/14 8478  . latanoprost (XALATAN) 0.005 % ophthalmic solution 1 drop  1 drop Both Eyes QHS Nelia Shi, MD   1 drop at 07/03/14 2347  . LORazepam  (ATIVAN) tablet 1 mg  1 mg Oral Q8H PRN Nelia Shi, MD      . Melene Muller ON 07/05/2014] magnesium oxide (MAG-OX) tablet 400 mg  400 mg Oral Q breakfast Belkys A Regalado, MD      . mirtazapine (REMERON) tablet 15  mg  15 mg Oral QHS Dot Lanes, MD   15 mg at 07/04/14 0000  . ondansetron (ZOFRAN) tablet 4 mg  4 mg Oral Q6H PRN Belkys A Regalado, MD       Or  . ondansetron (ZOFRAN) injection 4 mg  4 mg Intravenous Q6H PRN Belkys A Regalado, MD      . pantoprazole (PROTONIX) EC tablet 40 mg  40 mg Oral Daily Dot Lanes, MD   40 mg at 07/04/14 0958  . perphenazine (TRILAFON) tablet 16 mg  16 mg Oral QHS Dot Lanes, MD   16 mg at 07/03/14 2351  . [START ON 07/05/2014] potassium chloride SA (K-DUR,KLOR-CON) CR tablet 40 mEq  40 mEq Oral Daily Belkys A Regalado, MD      . sodium chloride 0.9 % injection 3 mL  3 mL Intravenous Q12H Belkys A Regalado, MD      . sodium chloride 0.9 % injection 3 mL  3 mL Intravenous PRN Belkys A Regalado, MD      . tamsulosin (FLOMAX) capsule 0.8 mg  0.8 mg Oral QHS Dot Lanes, MD   0.8 mg at 07/03/14 2352  . temazepam (RESTORIL) capsule 30 mg  30 mg Oral QHS Dot Lanes, MD   30 mg at 07/03/14 2349    Psychiatric Specialty Exam:     Blood pressure 133/58, pulse 68, temperature 97.6 F (36.4 C), temperature source Oral, resp. rate 18, height $RemoveBe'5\' 10"'fXtyLjOxR$  (1.778 m), SpO2 94 %.There is no weight on file to calculate BMI.  General Appearance: Casual  Eye Contact::  Fair  Speech:  slightly garbled  Volume:  Normal  Mood:  Anxious  Affect:  Congruent  Thought Process:  Coherent  Orientation:  Full (Time, Place, and Person)  Thought Content:  Hallucinations: Visual but not on assessment  Suicidal Thoughts:  No  Homicidal Thoughts:  No  Memory:  Immediate;   Fair Recent;   Fair Remote;   Fair  Judgement:  Impaired  Insight:  Fair  Psychomotor Activity:  Decreased  Concentration:  Fair  Recall:  [place]: Fair   Akathisia:  No  Handed:  Right  AIMS (if indicated):     Assets:  Financial Resources/Insurance Housing Resilience Social Support  Sleep:      Musculoskeletal: Strength & Muscle Tone: decreased Gait & Station: unable to stand Patient leans: Right  Treatment Plan Summary: Discharge from psychiatric services; evaluate the purpose of the Trilafon. Suggest discontinuing the Trilafon, slow down titration over 6-8 weeks, rapid oral discontinuation may lead to rebound psychosis and worsening symptoms.  Waylan Boga, [place] 07/04/2014 4:06 PM  Patient seen, evaluated and I agree with notes by Nurse Practitioner. Corena Pilgrim, MD

## 2014-07-04 NOTE — ED Notes (Signed)
Pt having continued hallucinations and shouts out, communicate threats, swear, be disrespectful to staff,  and climb out of bed. Attempt to redirect pt.

## 2014-07-05 ENCOUNTER — Inpatient Hospital Stay (HOSPITAL_COMMUNITY)
Admission: EM | Admit: 2014-07-05 | Discharge: 2014-07-05 | Disposition: A | Discharge status: Home / Self Care | Payer: Medicare Other | Source: Home / Self Care | Attending: Internal Medicine | Admitting: Internal Medicine

## 2014-07-05 DIAGNOSIS — G9340 Encephalopathy, unspecified: Principal | ICD-10-CM

## 2014-07-05 DIAGNOSIS — F29 Unspecified psychosis not due to a substance or known physiological condition: Secondary | ICD-10-CM

## 2014-07-05 DIAGNOSIS — R410 Disorientation, unspecified: Secondary | ICD-10-CM

## 2014-07-05 DIAGNOSIS — R609 Edema, unspecified: Secondary | ICD-10-CM

## 2014-07-05 DIAGNOSIS — R443 Hallucinations, unspecified: Secondary | ICD-10-CM

## 2014-07-05 LAB — GLUCOSE, CAPILLARY
GLUCOSE-CAPILLARY: 118 mg/dL — AB (ref 70–99)
Glucose-Capillary: 139 mg/dL — ABNORMAL HIGH (ref 70–99)
Glucose-Capillary: 150 mg/dL — ABNORMAL HIGH (ref 70–99)

## 2014-07-05 LAB — CBC
HCT: 46.5 % (ref 39.0–52.0)
Hemoglobin: 14.5 g/dL (ref 13.0–17.0)
MCH: 25.8 pg — AB (ref 26.0–34.0)
MCHC: 31.2 g/dL (ref 30.0–36.0)
MCV: 82.6 fL (ref 78.0–100.0)
Platelets: 234 10*3/uL (ref 150–400)
RBC: 5.63 MIL/uL (ref 4.22–5.81)
RDW: 16.7 % — ABNORMAL HIGH (ref 11.5–15.5)
WBC: 10.2 10*3/uL (ref 4.0–10.5)

## 2014-07-05 LAB — BASIC METABOLIC PANEL
Anion gap: 19 — ABNORMAL HIGH (ref 5–15)
BUN: 19 mg/dL (ref 6–23)
CO2: 24 mEq/L (ref 19–32)
Calcium: 9.5 mg/dL (ref 8.4–10.5)
Chloride: 100 mEq/L (ref 96–112)
Creatinine, Ser: 1.3 mg/dL (ref 0.50–1.35)
GFR calc non Af Amer: 53 mL/min — ABNORMAL LOW (ref >90)
GFR, EST AFRICAN AMERICAN: 62 mL/min — AB (ref >90)
Glucose, Bld: 125 mg/dL — ABNORMAL HIGH (ref 70–99)
Potassium: 4.3 mEq/L (ref 3.7–5.3)
SODIUM: 143 meq/L (ref 137–147)

## 2014-07-05 LAB — URINE CULTURE
Colony Count: >=100000
Special Requests: NORMAL

## 2014-07-05 LAB — MRSA PCR SCREENING: MRSA by PCR: NEGATIVE

## 2014-07-05 LAB — VITAMIN B12: VITAMIN B 12: 1537 pg/mL — AB (ref 211–911)

## 2014-07-05 MED ORDER — GLUCERNA SHAKE PO LIQD
237.0000 mL | Freq: Two times a day (BID) | ORAL | Status: DC
  Administered 2014-07-06 – 2014-07-08 (×3): 237 mL via ORAL
  Filled 2014-07-05 (×6): qty 237

## 2014-07-05 MED ORDER — ADULT MULTIVITAMIN W/MINERALS CH
1.0000 | ORAL_TABLET | Freq: Every day | ORAL | Status: DC
  Administered 2014-07-06 – 2014-07-08 (×3): 1 via ORAL
  Filled 2014-07-05 (×4): qty 1

## 2014-07-05 MED ORDER — HALOPERIDOL LACTATE 5 MG/ML IJ SOLN
4.0000 mg | Freq: Once | INTRAMUSCULAR | Status: AC
  Administered 2014-07-05: 4 mg via INTRAVENOUS
  Filled 2014-07-05: qty 1

## 2014-07-05 MED ORDER — LORAZEPAM 2 MG/ML IJ SOLN
1.0000 mg | Freq: Four times a day (QID) | INTRAMUSCULAR | Status: DC | PRN
  Administered 2014-07-05 – 2014-07-08 (×4): 1 mg via INTRAVENOUS
  Filled 2014-07-05 (×5): qty 1

## 2014-07-05 MED ORDER — PRO-STAT SUGAR FREE PO LIQD
30.0000 mL | Freq: Two times a day (BID) | ORAL | Status: DC
  Administered 2014-07-06 – 2014-07-07 (×3): 30 mL via ORAL
  Filled 2014-07-05 (×9): qty 30
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
## 2014-07-05 NOTE — Procedures (Signed)
History: 73 yo M with   Sedation:  Haldol  Technique: This is a 17 channel routine scalp EEG performed at the bedside with bipolar and monopolar montages arranged in accordance to the international 10/20 system of electrode placement. One channel was dedicated to EKG recording.    Background: There is a well defined posterior dominant rhythm of 9 Hz that attenuates with eye opening. The background consists of intermixed alpha and beta activities. There was no sleep recorded.   Photic stimulation: Physiologic driving is not performed  EEG Abnormalities: None  Clinical Interpretation: This normal EEG is recorded in the waking  state. There was no seizure or seizure predisposition recorded on this study.   Ritta SlotMcNeill Karli Wickizer, MD [facility] [phone]  If 7pm- 7am, please page neurology on call as listed in AMION.
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
## 2014-07-05 NOTE — Consult Note (Signed)
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 Known to our department from previous admissions over the past two years. Ulcer is located at the bottom of a deep crevice. There are two dried ulcerations on the right foot, second and third digits. Patient is incontinence of bowel and bladder and uses an external urinary catheter for management of urinary incontinence. Wound type: Pressure Pressure Ulcer POA: Yes Measurement: 0.5cm x 4cm x 0.2cm (sacrum).  Two dried areas on the second and third digits of the right foot, each measuring 0.5cm round with no depth. Wound ZOX:WRUEbed:pink, non-healing Drainage (amount, consistency, odor) scant serous Periwound:macerated Dressing procedure/placement/frequency: I will implement a dressing that seems to work for this patient; a calcium alginate dressing into the crevice to obliterate the dead space and to absorb moisture, then topped with a soft silicone foam dressing.  Patient is alert and uncooperative this morning, writer had to revisit as initially patient refused to ture and reposition.  There is a trapeze in the the bed and on occasion the patient will grasp this and assist with turning and bed mobility. I will provide a pressure redistribution heel boot to the right foot.  Patient is s/p amputation of the left foot (BKA) since my last assessment. WOC nursing team will not follow, but will remain available to this patient, the nursing and medical team.  Please re-consult if needed. Thanks, Ladona MowLaurie Amoura Ransier, MSN, RN, GNP, [place]OC[place], CWON-AP [phone])

## 2014-07-05 NOTE — Progress Notes (Signed)
[facility] PROGRESS NOTE  Nicholas [name] ZOX:[id number] DOB: 1940/11/16 DOA: 07/03/2014 PCP: Pearla DubonnetGATES,ROBERT NEVILL, MD  Assessment/Plan: 1-Encephalopathy; Hallucination, Psychosis;  This could be secondary to infection process, vs psychiatry disorder.  Admitted  to hospital for IV antibiotics,.  TSH 0.897, B-12: 1537 , Thiamine level pending. , RPR negative.  MRI showed old stroke, and EEG negative for seizure predisposition.  I have consulted Psych.  PRN ativan.  On Remeron, temazepam,   2-Diabetes, with hypoglycemia; hold glipizide. Will order SSI, would not cover blood sugar less than 150.   3-HTN, CAD; Continue with lasix, plavix, atenolol.   4-UTI; continue with ceftriaxone;  urine culture growing enterobacter sensitive to ceftriaxone. Marland Kitchen.   5-Chronic Diastolic Heart Failure; continue with lasix. Appears compensated.   6-Right LE edema; doppler negative for DVT.  7-Healing pressure 4 sacrum; wound care.   Code Status: full code.  Family Communication: none at bedside.  Disposition Plan: To be determine   Consultants:  Psych.   Procedures:  Doppler negative for DVT  Antibiotics:  Ceftriaxone.   HPI/Subjective: No complaints , still having hallucinations.   Objective: Filed Vitals:   07/05/14 0559  BP: 173/87  Pulse: 80  Temp: 98.3 F (36.8 C)  Resp: 20    Intake/Output Summary (Last 24 hours) at 07/05/14 1232 Last data filed at 07/05/14 1227  Gross per 24 hour  Intake    290 ml  Output   2926 ml  Net  -2636 ml   Filed Weights   07/04/14 1801  Weight: 107.23 kg (236 lb 6.4 oz)    Exam:   General:  Alert in no distress,   Cardiovascular: S 1, S 2 RRR  Respiratory: CTA  Abdomen: BS present, soft, NT  Musculoskeletal: trace edema right LE, left AKA.   Data Reviewed: Basic Metabolic Panel:  Recent Labs Lab 07/03/14 1021 07/04/14 1820 07/05/14 0832  NA 139 145 143  K 4.4 4.6 4.3  CL 102 101 100  CO2 26 29 24   GLUCOSE  67* 109* 125*  BUN 14 14 19   CREATININE 1.14 1.26 1.30  CALCIUM 9.1 9.9 9.5   Liver Function Tests:  Recent Labs Lab 07/03/14 1021 07/04/14 1820  AST 11 11  ALT 5 6  ALKPHOS 77 92  BILITOT 0.3 0.8  PROT 6.3 7.3  ALBUMIN 3.0* 3.5   No results for input(s): LIPASE, AMYLASE in the last 168 hours.  Recent Labs Lab 07/03/14 1021  AMMONIA 21   CBC:  Recent Labs Lab 07/03/14 1021 07/04/14 1820 07/05/14 0832  WBC 8.7 7.9 10.2  NEUTROABS 4.5  --   --   HGB 12.2* 14.5 14.5  HCT 40.3 46.4 46.5  MCV 83.8 83.9 82.6  PLT 211 233 234   Cardiac Enzymes: No results for input(s): CKTOTAL, CKMB, CKMBINDEX, TROPONINI in the last 168 hours. BNP (last 3 results)  Recent Labs  08/26/13 1130  PROBNP 918.5*   CBG:  Recent Labs Lab 07/04/14 0559 07/04/14 1832 07/04/14 2121 07/05/14 0804 07/05/14 1115  GLUCAP 92 93 113* 118* 139*    Recent Results (from the past 240 hour(s))  Urine culture     Status: None   Collection Time: 07/03/14  9:54 AM  Result Value Ref Range Status   Specimen Description URINE, CATHETERIZED  Final   Special Requests Normal  Final   Culture  Setup Time   Final    07/03/2014 15:30 Performed at [facility]    Colony Count   Final    >=  100,000 COLONIES/ML Performed at [facility]   Final    ENTEROBACTER AEROGENES Performed at [facility]    Report Status 07/05/2014 FINAL  Final   Organism ID, Bacteria ENTEROBACTER AEROGENES  Final      Susceptibility   Enterobacter aerogenes - MIC*    CEFAZOLIN >=64 RESISTANT Resistant     CEFTRIAXONE <=1 SENSITIVE Sensitive     CIPROFLOXACIN <=0.25 SENSITIVE Sensitive     GENTAMICIN <=1 SENSITIVE Sensitive     LEVOFLOXACIN <=0.12 SENSITIVE Sensitive     NITROFURANTOIN <=16 SENSITIVE Sensitive     TOBRAMYCIN <=1 SENSITIVE Sensitive     TRIMETH/SULFA <=20 SENSITIVE Sensitive     PIP/TAZO <=4 SENSITIVE Sensitive     * ENTEROBACTER AEROGENES     Studies: Dg  Chest 2 View  07/04/2014   CLINICAL DATA:  Acute encephalopathy.  Weakness.  EXAM: CHEST  2 VIEW  COMPARISON:  08/26/2013  FINDINGS: Mild enlargement of the heart is unchanged. 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 Lung markings are mildly prominent but unchanged. No focal airspace disease. No significant pulmonary edema. Trachea is midline. Mild blunting at the right costophrenic angle appears chronic. Degenerative changes in the thoracic spine without large pleural effusions. Small retrocardiac density is compatible with a hiatal hernia.a  IMPRESSION: No acute chest findings.   Electronically Signed   By: Richarda Overlie M.D.   On: 07/04/2014 15:48   Ct Head Wo Contrast  07/03/2014   CLINICAL DATA:  Altered mental status  EXAM: CT HEAD WITHOUT CONTRAST  TECHNIQUE: Contiguous axial images were obtained from the base of the skull through the vertex without intravenous contrast.  COMPARISON:  CT head 05/30/2013  FINDINGS: Age-appropriate atrophy.  Negative for hydrocephalus  Mild chronic microvascular ischemic changes in the white matter. Negative for acute infarct. Negative for hemorrhage or mass  Prior mastoidectomy on the right. Left mastoid sinus effusion and middle ear effusion no acute bony abnormality.  IMPRESSION: Mild atrophy and mild chronic microvascular ischemic change in the white matter. No acute abnormality.  Left middle ear and mastoid sinus effusion.   Electronically Signed   By: Marlan Palau M.D.   On: 07/03/2014 13:38   Mr Brain Wo Contrast  07/04/2014   CLINICAL DATA:  Altered mental status and agitation. Hallucinations.  EXAM: MRI HEAD WITHOUT CONTRAST  TECHNIQUE: Multiplanar, multiecho pulse sequences of the brain and surrounding structures were obtained without intravenous contrast.  COMPARISON:  CT head without contrast 07/03/2014.  FINDINGS: The study is mildly degraded by patient motion. No acute infarct, hemorrhage, or mass lesion is present. Mild generalized atrophy and periventricular  white matter changes are noted bilaterally. There are remote lacunar infarcts involving the right caudate head and anterior limb internal capsule. The ventricles are proportionate to the degree of atrophy. Flow is present in the major intracranial arteries. Midline images demonstrate degenerative changes in the upper cervical spine.  Paranasal sinuses are clear. Minimal fluid is present in the inferior right mastoid air cells. No obstructing nasopharyngeal lesion is evident. There is fluid in the left mastoid air cells and middle ear cavity. No obstructing nasopharyngeal lesion is evident.  IMPRESSION: 1. Mild atrophy and white matter disease likely reflects the sequela of chronic microvascular ischemia. 2. Remote lacunar infarcts of the right basal ganglia. 3. No acute intracranial abnormality. 4. Degenerative changes of the upper cervical spine. 5. Left greater right mastoid effusions. No obstructing nasopharyngeal lesion is evident.   Electronically Signed  By: Gennette Pachris  Mattern M.D.   On: 07/04/2014 19:07    Scheduled Meds: . allopurinol  300 mg Oral q morning - 10a  . aspirin EC  81 mg Oral Daily  . atenolol  50 mg Oral BID  . brimonidine  1 drop Both Eyes BID   And  . timolol  1 drop Both Eyes BID  . cefTRIAXone (ROCEPHIN)  IV  1 g Intravenous Q24H  . clopidogrel  75 mg Oral q morning - 10a  . enoxaparin (LOVENOX) injection  40 mg Subcutaneous Q24H  . furosemide  80 mg Oral q morning - 10a  . isosorbide mononitrate  30 mg Oral q morning - 10a  . latanoprost  1 drop Both Eyes QHS  . magnesium oxide  400 mg Oral Q breakfast  . mirtazapine  15 mg Oral QHS  . pantoprazole  40 mg Oral Daily  . perphenazine  16 mg Oral QHS  . potassium chloride SA  40 mEq Oral Daily  . sodium chloride  3 mL Intravenous Q12H  . tamsulosin  0.8 mg Oral QHS  . temazepam  30 mg Oral QHS   Continuous Infusions:   Active Problems:   CAD (coronary artery disease), native coronary artery   DNR (do not  resuscitate)   Chronic diastolic heart failure   Type 2 diabetes mellitus with diabetic nephropathy   UTI (lower urinary tract infection)   Encephalopathy acute   Hallucination    Time spent: 35 minutes.     Hartley Barefootegalado, Kelilah Hebard A  [facility] Pager [phone]. If 7PM-7AM, please contact night-coverage at [email or web address], password [email or web address] 07/05/2014, 12:32 PM  LOS: 2 days

## 2014-07-05 NOTE — Plan of Care (Signed)
Problem: Phase II Progression Outcomes Goal: Progress activity as tolerated unless otherwise ordered Outcome: Progressing Goal: Vital signs remain stable, temperature < 100 Outcome: Completed/Met Date Met:  07/05/14 Goal: Tolerating diet Outcome: Progressing Goal: Discharge plan established Outcome: Progressing Goal: Voiding independently Outcome: Completed/Met Date Met:  07/05/14 Condom cath

## 2014-07-05 NOTE — Progress Notes (Signed)
Pt seemed alert and oriented today per day shift RN, but his confusion got worse around 2030.  His wife was going to stay the night, but she said she couldn't handle how "crazy" he was acting and yelling at her.  Pt became agitated, was having hallucinations, and being verbally abusive to staff.  Staff tried to redirect and console patient, but he continued to yell out on and off loud enough to disturb surrounding patients.  On call NP notified several times throughout the night for iv ativan and haldol since pt refused to take po meds.   They worked slightly, but not for very long.  Will continue to monitor and redirect patient.

## 2014-07-05 NOTE — Progress Notes (Signed)
VASCULAR LAB PRELIMINARY  PRELIMINARY  PRELIMINARY  PRELIMINARY  Right lower extremity venous duplex completed.    Preliminary report:  Right:  No evidence of DVT, superficial thrombosis, or Baker's cyst.  Jenascia Bumpass, RVT 07/05/2014, 9:23 AM

## 2014-07-05 NOTE — Plan of Care (Signed)
Problem: Phase I Progression Outcomes Goal: Voiding-avoid urinary catheter unless indicated Outcome: Completed/Met Date Met:  07/05/14     

## 2014-07-05 NOTE — Progress Notes (Addendum)
INITIAL NUTRITION ASSESSMENT  DOCUMENTATION CODES Per approved criteria  -Obesity Unspecified   INTERVENTION: -Provide Glucerna Shake po TID, each supplement provides 220 kcal and 10 grams of protein -Provide Prostat liquid protein PO 30 ml BID with meals, each supplement provides 100 kcal, 15 grams protein. -Multivitamin with minerals daily -RD to continue to monitor  NUTRITION DIAGNOSIS: Increased nutrient (protein) needs related to wound healing as evidenced by Stage IV sacral wound and dried ulcerations on right foot.   Goal: Pt to meet >/= 90% of their estimated nutrition needs   Monitor:  PO and supplemental intake, weight, labs, I/O's  Reason for Assessment: Low Braden score <12  Admitting Dx: Encephalopathy; Hallucination, Psychosis  ASSESSMENT: 73 y.o. male with PMH significant for Diabetes, chronic venous insufficiency, morbid obesity, HTN, stroke, Diabetes, who was brought to ED by Police officer 11-29 because patient has been having AMS, agitation.   Pt with continued hallucinations, RD to follow-up with pt when pt is stable. Given AMS suspect poor PO intake.  Per weight history documentation, pt has had a 55 lb weight loss over the past year (19% wt loss x 1 year, insignificant for time frame). If pt loses anymore weight, pt would have significant weight loss.   Pt has a stage IV sacral wound and dried ulcerations on right foot. PMHx of left AKA d/t diabetic ulcers. Per documentation, pt with +2 RLE edema. Pt would benefit from protein supplement and daily multivitamin with minerals to aid in wound healing. RD to order Prostat BID and Glucerna BID between meals.  When patient's mental status is stable, pt would likely benefit from diet education given history of diabetes.  Labs reviewed: Glucose 125  Height: Ht Readings from Last 1 Encounters:  07/04/14 5\' 10"  (1.778 m)    Weight: Wt Readings from Last 1 Encounters:  07/04/14 236 lb 6.4 oz (107.23 kg)     Ideal Body Weight: 153 lb (with AKA)  % Ideal Body Weight: 154%  Wt Readings from Last 10 Encounters:  07/04/14 236 lb 6.4 oz (107.23 kg)  09/28/13 231 lb (104.781 kg)  09/08/13 231 lb 11.3 oz (105.1 kg)  08/30/13 248 lb 10.9 oz (112.8 kg)  06/28/13 291 lb (131.997 kg)  06/02/13 290 lb 9.1 oz (131.8 kg)  05/02/13 275 lb (124.739 kg)  12/21/12 270 lb (122.471 kg)  08/31/12 275 lb (124.739 kg)  06/02/12 275 lb (124.739 kg)    Usual Body Weight: 270-290 lb  % Usual Body Weight: 87%  BMI:  36.5 (with AKA)  Estimated Nutritional Needs: Kcal: 2400-2600 Protein: 105-115g Fluid: 2.4L/day  Skin: Stage IV sacral wound and dried ulcerations on right foot.   Diet Order: Diet heart healthy/carb modified  EDUCATION NEEDS: -Education not appropriate at this time   Intake/Output Summary (Last 24 hours) at 07/05/14 1656 Last data filed at 07/05/14 1227  Gross per 24 hour  Intake    290 ml  Output   2375 ml  Net  -2085 ml    Last BM: 11/26  Labs:   Recent Labs Lab 07/03/14 1021 07/04/14 1820 07/05/14 0832  NA 139 145 143  K 4.4 4.6 4.3  CL 102 101 100  CO2 26 29 24   BUN 14 14 19   CREATININE 1.14 1.26 1.30  CALCIUM 9.1 9.9 9.5  GLUCOSE 67* 109* 125*    CBG (last 3)   Recent Labs  07/05/14 0804 07/05/14 1115 07/05/14 1632  GLUCAP 118* 139* 150*    Scheduled Meds: . allopurinol  300 mg Oral q morning - 10a  . aspirin EC  81 mg Oral Daily  . atenolol  50 mg Oral BID  . brimonidine  1 drop Both Eyes BID   And  . timolol  1 drop Both Eyes BID  . cefTRIAXone (ROCEPHIN)  IV  1 g Intravenous Q24H  . clopidogrel  75 mg Oral q morning - 10a  . enoxaparin (LOVENOX) injection  40 mg Subcutaneous Q24H  . furosemide  80 mg Oral q morning - 10a  . isosorbide mononitrate  30 mg Oral q morning - 10a  . latanoprost  1 drop Both Eyes QHS  . magnesium oxide  400 mg Oral Q breakfast  . mirtazapine  15 mg Oral QHS  . pantoprazole  40 mg Oral Daily  .  perphenazine  16 mg Oral QHS  . potassium chloride SA  40 mEq Oral Daily  . sodium chloride  3 mL Intravenous Q12H  . tamsulosin  0.8 mg Oral QHS  . temazepam  30 mg Oral QHS    Continuous Infusions:   Past Medical History  Diagnosis Date  . Hypertension   . Peripheral vascular disease   . Ulcer     left foot  . Dyslipidemia   . Chronic venous insufficiency   . Sleep apnea   . Morbid obesity   . Diverticulosis   . Benign prostatic hypertrophy   . Glaucoma   . Gout   . Depression   . Insomnia   . CAD (coronary artery disease), native coronary artery   . GERD (gastroesophageal reflux disease)   . Angina   . Pneumonia 12/11/11  . Type II diabetes mellitus   . Chronic daily headache     "~ all the time"  . Seizures   . Stroke ~ 1983    "little weaker right hand"  . MRSA (methicillin resistant staph aureus) culture positive Dec. 17, 2013     right heel infection    Past Surgical History  Procedure Laterality Date  . Appendectomy    . Lumbar laminectomy    . Mastoid debridement    . Knee and ankle surgery  1979    "caved in in a ditch; messed up legs"; right  . Cholecystectomy    . Back surgery      "5 or 6"  . Peripheral arterial stent graft      "don't remember which side"  . Coronary artery bypass graft  1983; 1992    "CABG X 5; CABG X 8"  . Lower extremity angiogram  01/21/2012    Procedure: LOWER EXTREMITY ANGIOGRAM;  Surgeon: Nada LibmanVance W Brabham, MD;  [facility];  Service: Vascular;  Laterality: Bilateral;  WITH POSSIBLE INTERVENTION  . Eye surgery      "laser; both eyes"  . Eye surgery  05/08/2012    Cataract Left eye  . Heel bx  Dec. 17, 2013    Right Heel Bx.    . Transluminal atherectomy popliteal artery  01/21/2012    Left  . Amputation Left 08/27/2013    Procedure: LEFT ABOVE THE KNEE AMPUTATION ;  Surgeon: Pryor OchoaJames D Lawson, MD;  [facility];  Service: Vascular;  Laterality: Left;    Tilda FrancoLindsey Jemuel Laursen, MS, RD, LDN Pager: [phone] After Hours Pager:  [phone]

## 2014-07-05 NOTE — Progress Notes (Signed)
EEG Completed; Results Pending  

## 2014-07-05 NOTE — Consult Note (Signed)
[facility] Face-to-Face Psychiatry Consult   Reason for Consult:  Medication management for AMS with UTI, agitation and hallucinations Referring Physician:  Dr. Otilio Saber D [name] is an 73 y.o. male. Total Time spent with patient: 30 minutes  Assessment: AXIS I:  Psychotic Disorder NOS and Delirium secondary to UTI AXIS II:  Deferred AXIS III:   Past Medical History  Diagnosis Date  . Hypertension   . Peripheral vascular disease   . Ulcer     left foot  . Dyslipidemia   . Chronic venous insufficiency   . Sleep apnea   . Morbid obesity   . Diverticulosis   . Benign prostatic hypertrophy   . Glaucoma   . Gout   . Depression   . Insomnia   . CAD (coronary artery disease), native coronary artery   . GERD (gastroesophageal reflux disease)   . Angina   . Pneumonia 12/11/11  . Type II diabetes mellitus   . Chronic daily headache     "~ all the time"  . Seizures   . Stroke ~ 1983    "little weaker right hand"  . MRSA (methicillin resistant staph aureus) culture positive Dec. 17, 2013     right heel infection   AXIS IV:  other psychosocial or environmental problems, problems related to social environment and problems with primary support group AXIS V:  41-50 serious symptoms  Plan: Please address the UTI and underling medical condition for AMS Continue psych medication and will obtain collateral information If he continues to be psychotic after medical treatment, he may need geripsych bed  Supportive therapy provided about ongoing stressors.  Appreciate psychiatric consultation and follow up as clinically required Please contact [phone] or [phone] if needs further assistance  Subjective:   Nicholas [name] is a 73 y.o. male patient admitted with AMS and agitation.  HPI:  Nicholas [name] is a 73 y.o. male seen for psych consultation for AMS and agitation. Patient appeared in delirious and talking himself and unable to participate in psych evaluation. Patient appeared  talking to himself with no stimuli. Patient has no apparent agitation or aggression during this visit. Tarri Fuller, LCSW stated that she tried to contact family who seems to be more emotional and unable to provide further information at this time. Patient has been on Trilafon, remeron and restoril.   Patient with PMH significant for Diabetes, chronic venous insufficiency, morbid obesity, HTN, stroke, Diabetes, who was brought to ED by Police officer 24-49 because patient has been having AMS, agitation. He has been calling the police multiple times, 4 times. The police took fire arm from him. Per wife patient has been agitated for last month, having hallucinations. This has been getting worse recently. Patient denies chest pain , dyspnea, headaches. He does relates that he has been seeing a man head on the ceiling of the room. Things crawling. No diarrhea, no abdominal pain. Patient has been agitated. He improved after he received geodon.   Evaluation in ED: UA with positive nitrates, urine growing 100,000 gram negatives, he has had some hypoglycemia episodes in the ED.   Review of Systems:  Negative, except as per HPI. HPI Elements:   Location:  AMS and delirium. Quality:  poor. Severity:  acute and chronic. Timing:  unknown.  Past Psychiatric History: Past Medical History  Diagnosis Date  . Hypertension   . Peripheral vascular disease   . Ulcer     left foot  . Dyslipidemia   .  Chronic venous insufficiency   . Sleep apnea   . Morbid obesity   . Diverticulosis   . Benign prostatic hypertrophy   . Glaucoma   . Gout   . Depression   . Insomnia   . CAD (coronary artery disease), native coronary artery   . GERD (gastroesophageal reflux disease)   . Angina   . Pneumonia 12/11/11  . Type II diabetes mellitus   . Chronic daily headache     "~ all the time"  . Seizures   . Stroke ~ 1983    "little weaker right hand"  . MRSA (methicillin resistant staph aureus) culture positive Dec.  17, 2013     right heel infection    reports that he quit smoking about 22 years ago. His smoking use included Cigarettes. He has a 20 pack-year smoking history. He quit smokeless tobacco use about 26 years ago. His smokeless tobacco use included Chew. He reports that he does not drink alcohol or use illicit drugs. Family History  Problem Relation Age of Onset  . Hypertension Other   . Cancer Other   . Cancer Sister   . Deep vein thrombosis Brother   . Heart disease Brother   . Deep vein thrombosis Brother   . Heart disease Brother   . Cancer Sister    Family History Substance Abuse: No Family Supports: Yes, List: (wife, son) Living Arrangements: Spouse/significant other Can pt return to current living arrangement?: Yes Abuse/Neglect [facility]) Physical Abuse: Denies Verbal Abuse: Denies Sexual Abuse: Denies Allergies:   Allergies  Allergen Reactions  . Contrast Media [Iodinated Diagnostic Agents] Other (See Comments)    Shaking and feeling terrible    ACT Assessment Complete:  NO Objective: Blood pressure 173/87, pulse 80, temperature 98.3 F (36.8 C), temperature source Oral, resp. rate 20, height $RemoveBe'5\' 10"'ZbRudmGEy$  (1.778 m), weight 107.23 kg (236 lb 6.4 oz), SpO2 93 %.Body mass index is 33.92 kg/(m^2). Results for orders placed or performed during the hospital encounter of 07/03/14 (from the past 72 hour(s))  Urinalysis, Routine w reflex microscopic     Status: Abnormal   Collection Time: 07/03/14  9:54 AM  Result Value Ref Range   Color, Urine YELLOW YELLOW   APPearance CLEAR CLEAR   Specific Gravity, Urine 1.016 1.005 - 1.030   pH 5.0 5.0 - 8.0   Glucose, UA NEGATIVE NEGATIVE mg/dL   Hgb urine dipstick NEGATIVE NEGATIVE   Bilirubin Urine NEGATIVE NEGATIVE   Ketones, ur NEGATIVE NEGATIVE mg/dL   Protein, ur 30 (A) NEGATIVE mg/dL   Urobilinogen, UA 0.2 0.0 - 1.0 mg/dL   Nitrite POSITIVE (A) NEGATIVE   Leukocytes, UA NEGATIVE NEGATIVE  Urine culture     Status: None (Preliminary  result)   Collection Time: 07/03/14  9:54 AM  Result Value Ref Range   Specimen Description URINE, CATHETERIZED    Special Requests Normal    Culture  Setup Time      07/03/2014 15:30 Performed at [facility]      >=100,000 COLONIES/ML Performed at [facility] Performed at [facility]    Report Status PENDING   Urine microscopic-add on     Status: Abnormal   Collection Time: 07/03/14  9:54 AM  Result Value Ref Range   Squamous Epithelial / LPF RARE RARE   WBC, UA 0-2 <3 WBC/hpf   RBC / HPF 0-2 <3 RBC/hpf  Bacteria, UA MANY (A) RARE  CBC WITH DIFFERENTIAL     Status: Abnormal   Collection Time: 07/03/14 10:21 AM  Result Value Ref Range   WBC 8.7 4.0 - 10.5 K/uL   RBC 4.81 4.22 - 5.81 MIL/uL   Hemoglobin 12.2 (L) 13.0 - 17.0 g/dL   HCT 40.3 39.0 - 52.0 %   MCV 83.8 78.0 - 100.0 fL   MCH 25.4 (L) 26.0 - 34.0 pg   MCHC 30.3 30.0 - 36.0 g/dL   RDW 16.7 (H) 11.5 - 15.5 %   Platelets 211 150 - 400 K/uL   Neutrophils Relative % 51 43 - 77 %   Neutro Abs 4.5 1.7 - 7.7 K/uL   Lymphocytes Relative 39 12 - 46 %   Lymphs Abs 3.4 0.7 - 4.0 K/uL   Monocytes Relative 5 3 - 12 %   Monocytes Absolute 0.4 0.1 - 1.0 K/uL   Eosinophils Relative 5 0 - 5 %   Eosinophils Absolute 0.4 0.0 - 0.7 K/uL   Basophils Relative 0 0 - 1 %   Basophils Absolute 0.0 0.0 - 0.1 K/uL  Ammonia     Status: None   Collection Time: 07/03/14 10:21 AM  Result Value Ref Range   Ammonia 21 11 - 60 umol/L  Lactic acid, plasma     Status: None   Collection Time: 07/03/14 10:21 AM  Result Value Ref Range   Lactic Acid, Venous 1.1 0.5 - 2.2 mmol/L  Protime-INR     Status: None   Collection Time: 07/03/14 10:21 AM  Result Value Ref Range   Prothrombin Time 13.5 11.6 - 15.2 seconds   INR 1.02 0.00 - 1.49  Comprehensive metabolic panel     Status: Abnormal   Collection Time: 07/03/14 10:21 AM  Result Value Ref Range   Sodium 139  137 - 147 mEq/L   Potassium 4.4 3.7 - 5.3 mEq/L   Chloride 102 96 - 112 mEq/L   CO2 26 19 - 32 mEq/L   Glucose, Bld 67 (L) 70 - 99 mg/dL   BUN 14 6 - 23 mg/dL   Creatinine, Ser 1.14 0.50 - 1.35 mg/dL   Calcium 9.1 8.4 - 10.5 mg/dL   Total Protein 6.3 6.0 - 8.3 g/dL   Albumin 3.0 (L) 3.5 - 5.2 g/dL   AST 11 0 - 37 U/L   ALT 5 0 - 53 U/L   Alkaline Phosphatase 77 39 - 117 U/L   Total Bilirubin 0.3 0.3 - 1.2 mg/dL   GFR calc non Af Amer 62 (L) >90 mL/min   GFR calc Af Amer 72 (L) >90 mL/min    Comment: (NOTE) The eGFR has been calculated using the CKD EPI equation. This calculation has not been validated in all clinical situations. eGFR's persistently <90 mL/min signify possible Chronic Kidney Disease.    Anion gap 11 5 - 15  CBG monitoring, ED     Status: Abnormal   Collection Time: 07/03/14  4:25 PM  Result Value Ref Range   Glucose-Capillary 65 (L) 70 - 99 mg/dL  CBG monitoring, ED     Status: Abnormal   Collection Time: 07/03/14 11:21 PM  Result Value Ref Range   Glucose-Capillary 69 (L) 70 - 99 mg/dL   Comment 1 Documented in Chart    Comment 2 Notify RN   CBG monitoring, ED     Status: None   Collection Time: 07/03/14 11:57 PM  Result Value Ref Range   Glucose-Capillary 72 70 -  99 mg/dL   Comment 1 Documented in Chart    Comment 2 Notify RN   CBG monitoring, ED     Status: None   Collection Time: 07/04/14  5:59 AM  Result Value Ref Range   Glucose-Capillary 92 70 - 99 mg/dL   Comment 1 Documented in Chart    Comment 2 Notify RN   CBC     Status: Abnormal   Collection Time: 07/04/14  6:20 PM  Result Value Ref Range   WBC 7.9 4.0 - 10.5 K/uL   RBC 5.53 4.22 - 5.81 MIL/uL   Hemoglobin 14.5 13.0 - 17.0 g/dL   HCT 46.4 39.0 - 52.0 %   MCV 83.9 78.0 - 100.0 fL   MCH 26.2 26.0 - 34.0 pg   MCHC 31.3 30.0 - 36.0 g/dL   RDW 16.9 (H) 11.5 - 15.5 %   Platelets 233 150 - 400 K/uL  Comprehensive metabolic panel     Status: Abnormal   Collection Time: 07/04/14  6:20 PM   Result Value Ref Range   Sodium 145 137 - 147 mEq/L   Potassium 4.6 3.7 - 5.3 mEq/L   Chloride 101 96 - 112 mEq/L   CO2 29 19 - 32 mEq/L   Glucose, Bld 109 (H) 70 - 99 mg/dL   BUN 14 6 - 23 mg/dL   Creatinine, Ser 1.26 0.50 - 1.35 mg/dL   Calcium 9.9 8.4 - 10.5 mg/dL   Total Protein 7.3 6.0 - 8.3 g/dL   Albumin 3.5 3.5 - 5.2 g/dL   AST 11 0 - 37 U/L   ALT 6 0 - 53 U/L   Alkaline Phosphatase 92 39 - 117 U/L   Total Bilirubin 0.8 0.3 - 1.2 mg/dL   GFR calc non Af Amer 55 (L) >90 mL/min   GFR calc Af Amer 64 (L) >90 mL/min    Comment: (NOTE) The eGFR has been calculated using the CKD EPI equation. This calculation has not been validated in all clinical situations. eGFR's persistently <90 mL/min signify possible Chronic Kidney Disease.    Anion gap 15 5 - 15  TSH     Status: None   Collection Time: 07/04/14  6:20 PM  Result Value Ref Range   TSH 0.897 0.350 - 4.500 uIU/mL    Comment: Performed at [facility]  Vitamin B12     Status: Abnormal   Collection Time: 07/04/14  6:20 PM  Result Value Ref Range   Vitamin B-12 1537 (H) 211 - 911 pg/mL    Comment: Performed at [facility]  RPR     Status: None   Collection Time: 07/04/14  6:20 PM  Result Value Ref Range   RPR NON REAC NON REAC    Comment: Performed at [facility]  Glucose, capillary     Status: None   Collection Time: 07/04/14  6:32 PM  Result Value Ref Range   Glucose-Capillary 93 70 - 99 mg/dL   Comment 1 Notify RN   Glucose, capillary     Status: Abnormal   Collection Time: 07/04/14  9:21 PM  Result Value Ref Range   Glucose-Capillary 113 (H) 70 - 99 mg/dL  Glucose, capillary     Status: Abnormal   Collection Time: 07/05/14  8:04 AM  Result Value Ref Range   Glucose-Capillary 118 (H) 70 - 99 mg/dL  Basic metabolic panel     Status: Abnormal   Collection Time: 07/05/14  8:32 AM  Result Value Ref  Range   Sodium 143 137 - 147 mEq/L   Potassium 4.3 3.7 - 5.3 mEq/L   Chloride 100 96  - 112 mEq/L   CO2 24 19 - 32 mEq/L   Glucose, Bld 125 (H) 70 - 99 mg/dL   BUN 19 6 - 23 mg/dL   Creatinine, Ser 1.30 0.50 - 1.35 mg/dL   Calcium 9.5 8.4 - 10.5 mg/dL   GFR calc non Af Amer 53 (L) >90 mL/min   GFR calc Af Amer 62 (L) >90 mL/min    Comment: (NOTE) The eGFR has been calculated using the CKD EPI equation. This calculation has not been validated in all clinical situations. eGFR's persistently <90 mL/min signify possible Chronic Kidney Disease.    Anion gap 19 (H) 5 - 15  CBC     Status: Abnormal   Collection Time: 07/05/14  8:32 AM  Result Value Ref Range   WBC 10.2 4.0 - 10.5 K/uL   RBC 5.63 4.22 - 5.81 MIL/uL   Hemoglobin 14.5 13.0 - 17.0 g/dL   HCT 46.5 39.0 - 52.0 %   MCV 82.6 78.0 - 100.0 fL   MCH 25.8 (L) 26.0 - 34.0 pg   MCHC 31.2 30.0 - 36.0 g/dL   RDW 16.7 (H) 11.5 - 15.5 %   Platelets 234 150 - 400 K/uL   Labs are reviewed .  Current Facility-Administered Medications  Medication Dose Route Frequency Provider Last Rate Last Dose  . 0.9 %  sodium chloride infusion  250 mL Intravenous PRN Belkys A Regalado, MD      . allopurinol (ZYLOPRIM) tablet 300 mg  300 mg Oral q morning - 10a Dot Lanes, MD   300 mg at 07/04/14 0959  . alum & mag hydroxide-simeth (MAALOX/MYLANTA) 200-200-20 MG/5ML suspension 30 mL  30 mL Oral PRN Dot Lanes, MD      . aspirin EC tablet 81 mg  81 mg Oral Daily Dot Lanes, MD   81 mg at 07/04/14 0959  . atenolol (TENORMIN) tablet 50 mg  50 mg Oral BID Dot Lanes, MD   50 mg at 07/04/14 0959  . brimonidine (ALPHAGAN) 0.2 % ophthalmic solution 1 drop  1 drop Both Eyes BID Dot Lanes, MD   1 drop at 07/04/14 1042   And  . timolol (TIMOPTIC) 0.5 % ophthalmic solution 1 drop  1 drop Both Eyes BID Dot Lanes, MD   1 drop at 07/04/14 1049  . cefTRIAXone (ROCEPHIN) 1 g in dextrose 5 % 50 mL IVPB  1 g Intravenous Q24H Belkys A Regalado, MD   1 g at 07/04/14 1846  . clopidogrel (PLAVIX) tablet 75 mg  75 mg Oral q  morning - 10a Dot Lanes, MD   75 mg at 07/04/14 0959  . enoxaparin (LOVENOX) injection 40 mg  40 mg Subcutaneous Q24H Belkys A Regalado, MD   40 mg at 07/04/14 1851  . furosemide (LASIX) tablet 80 mg  80 mg Oral q morning - 10a Dot Lanes, MD   80 mg at 07/04/14 1040  . isosorbide mononitrate (IMDUR) 24 hr tablet 30 mg  30 mg Oral q morning - 10a Dot Lanes, MD   30 mg at 07/04/14 6301  . latanoprost (XALATAN) 0.005 % ophthalmic solution 1 drop  1 drop Both Eyes QHS Dot Lanes, MD   1 drop at 07/03/14 2347  . LORazepam (ATIVAN) tablet 1 mg  1 mg Oral Q8H PRN Robert L  Audie Pinto, MD   1 mg at 07/04/14 2119  . magnesium oxide (MAG-OX) tablet 400 mg  400 mg Oral Q breakfast Belkys A Regalado, MD      . mirtazapine (REMERON) tablet 15 mg  15 mg Oral QHS Dot Lanes, MD   15 mg at 07/04/14 0000  . ondansetron (ZOFRAN) tablet 4 mg  4 mg Oral Q6H PRN Belkys A Regalado, MD       Or  . ondansetron (ZOFRAN) injection 4 mg  4 mg Intravenous Q6H PRN Belkys A Regalado, MD      . pantoprazole (PROTONIX) EC tablet 40 mg  40 mg Oral Daily Dot Lanes, MD   40 mg at 07/04/14 0958  . perphenazine (TRILAFON) tablet 16 mg  16 mg Oral QHS Dot Lanes, MD   16 mg at 07/03/14 2351  . potassium chloride SA (K-DUR,KLOR-CON) CR tablet 40 mEq  40 mEq Oral Daily Belkys A Regalado, MD      . sodium chloride 0.9 % injection 3 mL  3 mL Intravenous Q12H Belkys A Regalado, MD   3 mL at 07/04/14 1445  . sodium chloride 0.9 % injection 3 mL  3 mL Intravenous PRN Belkys A Regalado, MD      . tamsulosin (FLOMAX) capsule 0.8 mg  0.8 mg Oral QHS Dot Lanes, MD   0.8 mg at 07/03/14 2352  . temazepam (RESTORIL) capsule 30 mg  30 mg Oral QHS Dot Lanes, MD   30 mg at 07/03/14 2349    Psychiatric Specialty Exam: Physical Exam  Genitourinary: Guaiac positive stool.   as per history and physical  ROS talking himself continuously  Blood pressure 173/87, pulse 80, temperature 98.3 F (36.8 C),  temperature source Oral, resp. rate 20, height $RemoveBe'5\' 10"'dFBHRgLHg$  (1.778 m), weight 107.23 kg (236 lb 6.4 oz), SpO2 93 %.Body mass index is 33.92 kg/(m^2).  General Appearance: Bizarre  Eye Contact::  Minimal  Speech:  Garbled and Slurred  Volume:  Increased  Mood:  NA  Affect:  Inappropriate  Thought Process:  Disorganized and Irrelevant  Orientation:  Full (Time, Place, and Person)  Thought Content:  Negative  Suicidal Thoughts:  No  Homicidal Thoughts:  No  Memory:  NA  Judgement:  Poor  Insight:  Lacking  Psychomotor Activity:  Increased and Restlessness  Concentration:  Poor  Recall:  Poor  Fund of Knowledge:Poor  Language: Fair  Akathisia:  NA  Handed:  Right  AIMS (if indicated):     Assets:  Others:  need further assessment  Sleep:      Musculoskeletal: Strength & Muscle Tone: decreased Gait & Station: unable to stand Patient leans: N/A  Treatment Plan Summary: Daily contact with patient to assess and evaluate symptoms and progress in treatment Medication management  Agree with haldol PRN and follow up tomorrow.  Rainn Bullinger,JANARDHAHA R. 07/05/2014 11:14 AM

## 2014-07-06 DIAGNOSIS — I2510 Atherosclerotic heart disease of native coronary artery without angina pectoris: Secondary | ICD-10-CM

## 2014-07-06 LAB — BASIC METABOLIC PANEL
Anion gap: 21 — ABNORMAL HIGH (ref 5–15)
BUN: 28 mg/dL — AB (ref 6–23)
CALCIUM: 9.2 mg/dL (ref 8.4–10.5)
CO2: 23 mEq/L (ref 19–32)
CREATININE: 1.34 mg/dL (ref 0.50–1.35)
Chloride: 99 mEq/L (ref 96–112)
GFR calc Af Amer: 59 mL/min — ABNORMAL LOW (ref >90)
GFR, EST NON AFRICAN AMERICAN: 51 mL/min — AB (ref >90)
GLUCOSE: 155 mg/dL — AB (ref 70–99)
Potassium: 3.9 mEq/L (ref 3.7–5.3)
Sodium: 143 mEq/L (ref 137–147)

## 2014-07-06 LAB — GLUCOSE, CAPILLARY
GLUCOSE-CAPILLARY: 164 mg/dL — AB (ref 70–99)
GLUCOSE-CAPILLARY: 182 mg/dL — AB (ref 70–99)
GLUCOSE-CAPILLARY: 149 mg/dL — AB (ref 70–99)
Glucose-Capillary: 134 mg/dL — ABNORMAL HIGH (ref 70–99)
Glucose-Capillary: 141 mg/dL — ABNORMAL HIGH (ref 70–99)

## 2014-07-06 LAB — CBC
HEMATOCRIT: 45.9 % (ref 39.0–52.0)
HEMOGLOBIN: 14.4 g/dL (ref 13.0–17.0)
MCH: 25.8 pg — AB (ref 26.0–34.0)
MCHC: 31.4 g/dL (ref 30.0–36.0)
MCV: 82.3 fL (ref 78.0–100.0)
Platelets: 263 10*3/uL (ref 150–400)
RBC: 5.58 MIL/uL (ref 4.22–5.81)
RDW: 16.8 % — ABNORMAL HIGH (ref 11.5–15.5)
WBC: 10.7 10*3/uL — ABNORMAL HIGH (ref 4.0–10.5)

## 2014-07-06 LAB — HEMOGLOBIN A1C
Hgb A1c MFr Bld: 6.1 % — ABNORMAL HIGH (ref <5.7)
Mean Plasma Glucose: 128 mg/dL — ABNORMAL HIGH (ref <117)

## 2014-07-06 MED ORDER — INSULIN ASPART 100 UNIT/ML ~~LOC~~ SOLN
0.0000 [IU] | Freq: Three times a day (TID) | SUBCUTANEOUS | Status: DC
  Administered 2014-07-06: 2 [IU] via SUBCUTANEOUS
  Administered 2014-07-06: 3 [IU] via SUBCUTANEOUS
  Administered 2014-07-07: 1 [IU] via SUBCUTANEOUS
  Administered 2014-07-07 – 2014-07-08 (×3): 2 [IU] via SUBCUTANEOUS
  Administered 2014-07-08: 1 [IU] via SUBCUTANEOUS

## 2014-07-06 MED ORDER — HALOPERIDOL LACTATE 5 MG/ML IJ SOLN
4.0000 mg | Freq: Four times a day (QID) | INTRAMUSCULAR | Status: DC | PRN
  Administered 2014-07-06: 4 mg via INTRAVENOUS
  Filled 2014-07-06 (×2): qty 1

## 2014-07-06 NOTE — Evaluation (Signed)
Physical Therapy Evaluation Patient Details Name: Nicholas [name] MRN: [id number] DOB: 1941-04-29 Today's Date: 07/06/2014   History of Present Illness  73 y.o. male with PMH significant for diabetes, chronic venous insufficiency, morbid obesity, HTN, stroke, CABG, left AKA who was brought to ED by Police officer 07/03/14 because patient has been having AMS and agitation.   Clinical Impression  Pt currently with functional limitations due to the deficits listed below (see PT Problem List). Pt will benefit from skilled PT to increase their independence and safety with mobility to allow discharge to the venue listed below.  Pt assisted to sitting EOB however did not mobilize further for safety as pt very confused, decreased cognition.     Follow Up Recommendations SNF    Equipment Recommendations  None recommended by PT    Recommendations for Other Services       Precautions / Restrictions Precautions Precautions: Fall Precaution Comments: L AKA      Mobility  Bed Mobility Overal bed mobility: +2 for physical assistance;Needs Assistance Bed Mobility: Supine to Sit;Sit to Supine     Supine to sit: Total assist;+2 for physical assistance Sit to supine: Max assist;+2 for physical assistance   General bed mobility comments: assist for upper and lower body, attempting to assist at times, limited by weakness and/or cognition  Transfers                    Ambulation/Gait                Stairs            Wheelchair Mobility    Modified Rankin (Stroke Patients Only)       Balance Overall balance assessment: Needs assistance Sitting-balance support: Feet unsupported;Bilateral upper extremity supported Sitting balance-Leahy Scale: Fair Sitting balance - Comments: able to sit EOB with min/guard assist as if pt moved UE required trunk support, cannot tolerate any challenge without assist                                     Pertinent  Vitals/Pain Pain Assessment: No/denies pain    Home Living Family/patient expects to be discharged to:: Private residence Living Arrangements: Spouse/significant other             Home Equipment: Environmental consultantWalker - 2 wheels;Wheelchair - Lawyermanual;Shower seat Additional Comments: above per previous admission, pt with poor cognition and confusion unreliable historian    Prior Function Level of Independence: Needs assistance         Comments: per RN, son lifting pt bed to w/c     Hand Dominance        Extremity/Trunk Assessment   Upper Extremity Assessment: Generalized weakness;Difficult to assess due to impaired cognition           Lower Extremity Assessment: Generalized weakness;LLE deficits/detail;RLE deficits/detail;Difficult to assess due to impaired cognition RLE Deficits / Details: edematous R foot, in PRAFO LLE Deficits / Details: L AKA     Communication   Communication: No difficulties  Cognition Arousal/Alertness: Awake/alert Behavior During Therapy: Restless Overall Cognitive Status: No family/caregiver present to determine baseline cognitive functioning Area of Impairment: Orientation;Following commands Orientation Level: Disoriented to;Place;Time;Situation   Memory: Decreased short-term memory Following Commands: Follows one step commands inconsistently       General Comments: pt very confused, perseverating on taking test in context of paternity test, mentioning he'll pay for the baby if it  is his; easy to redirect however returned to talking about test    General Comments      Exercises        Assessment/Plan    PT Assessment Patient needs continued PT services  PT Diagnosis Generalized weakness;Altered mental status   PT Problem List Decreased strength;Decreased activity tolerance;Decreased balance;Decreased mobility;Decreased cognition  PT Treatment Interventions DME instruction;Functional mobility training;Patient/family education;Wheelchair  mobility training;Therapeutic activities;Therapeutic exercise;Balance training;Neuromuscular re-education   PT Goals (Current goals can be found in the Care Plan section) Acute Rehab PT Goals PT Goal Formulation: Patient unable to participate in goal setting Time For Goal Achievement: 07/20/14 Potential to Achieve Goals: Fair    Frequency Min 2X/week   Barriers to discharge        Co-evaluation               End of Session   Activity Tolerance: Patient tolerated treatment well (limited by cognition, confusion) Patient left: in bed;with call bell/phone within reach;with bed alarm set Nurse Communication: Mobility status         Time: [id number] PT Time Calculation (min) (ACUTE ONLY): 17 min   Charges:   PT Evaluation $Initial PT Evaluation Tier I: 1 Procedure PT Treatments $Therapeutic Activity: 8-22 mins   PT G Codes:          Venancio Chenier,KATHrine E 07/06/2014, 12:26 PM Zenovia JarredKati Leah Skora, PT, DPT 07/06/2014 Pager: [phone]

## 2014-07-06 NOTE — Progress Notes (Signed)
OT Cancellation Note  Patient Details Name: Nicholas [name] MRN: [id number] DOB: 11-20-1940   Cancelled Treatment:    Reason Eval/Treat Not Completed: Other (comment) Note pt limited on PT eval due to confusion. Will check back next date.  Lennox LaityStone, Marsha Gundlach Stafford  [phone] 07/06/2014, 3:01 PM

## 2014-07-06 NOTE — Consult Note (Signed)
Psychiatry Consult Follow-up note  Reason for Consult:  Medication management for AMS with UTI, agitation and hallucinations Referring Physician:  Dr. Otilio Saber D Giannetti is an 73 y.o. male. Total Time spent with patient: 15 minutes  Assessment: AXIS I:  Psychotic Disorder NOS and Delirium secondary to UTI AXIS II:  Deferred AXIS III:   Past Medical History  Diagnosis Date  . Hypertension   . Peripheral vascular disease   . Ulcer     left foot  . Dyslipidemia   . Chronic venous insufficiency   . Sleep apnea   . Morbid obesity   . Diverticulosis   . Benign prostatic hypertrophy   . Glaucoma   . Gout   . Depression   . Insomnia   . CAD (coronary artery disease), native coronary artery   . GERD (gastroesophageal reflux disease)   . Angina   . Pneumonia 12/11/11  . Type II diabetes mellitus   . Chronic daily headache     "~ all the time"  . Seizures   . Stroke ~ 1983    "little weaker right hand"  . MRSA (methicillin resistant staph aureus) culture positive Dec. 17, 2013     right heel infection   AXIS IV:  other psychosocial or environmental problems, problems related to social environment and problems with primary support group AXIS V:  41-50 serious symptoms  Plan: Please address the UTI and underling medical condition for AMS Continue psych medication and will obtain collateral information If he continues to be psychotic after medical treatment, he may need geripsych bed  Supportive therapy provided about ongoing stressors.  Appreciate psychiatric consultation and follow up as clinically required Please contact [phone] or [phone] if needs further assistance  Subjective:   SABASTION HRDLICKA is a 73 y.o. male patient admitted with AMS and agitation.  HPI:  KENARD MORAWSKI is a 73 y.o. male seen for psych consultation for AMS and agitation. Patient appeared in delirious and talking himself and unable to participate in psych evaluation. Patient appeared  talking to himself with no stimuli. Patient has no apparent agitation or aggression during this visit. Tarri Fuller, LCSW stated that she tried to contact family who seems to be more emotional and unable to provide further information at this time. Patient has been on Trilafon, remeron and restoril.   Interval history: Patient is seen with the Sindy Messing, LCSW today. Patient has no changes in his mental health status and continued to be responding to the internal stimuli, talking to himself and getting irritable. Patient seems like needed out-of-home placement when he was medically cleared.  Patient with PMH significant for Diabetes, chronic venous insufficiency, morbid obesity, HTN, stroke, Diabetes, who was brought to ED by Police officer [id number] because patient has been having AMS, agitation. He has been calling the police multiple times, 4 times. The police took fire arm from him. Per wife patient has been agitated for last month, having hallucinations. This has been getting worse recently. Patient denies chest pain , dyspnea, headaches. He does relates that he has been seeing a man head on the ceiling of the room. Things crawling. No diarrhea, no abdominal pain. Patient has been agitated. He improved after he received geodon.   Evaluation in ED: UA with positive nitrates, urine growing 100,000 gram negatives, he has had some hypoglycemia episodes in the ED.   Review of Systems:  Negative, except as per HPI. HPI Elements:   Location:  AMS and  delirium. Quality:  poor. Severity:  acute and chronic. Timing:  unknown.  Past Psychiatric History: Past Medical History  Diagnosis Date  . Hypertension   . Peripheral vascular disease   . Ulcer     left foot  . Dyslipidemia   . Chronic venous insufficiency   . Sleep apnea   . Morbid obesity   . Diverticulosis   . Benign prostatic hypertrophy   . Glaucoma   . Gout   . Depression   . Insomnia   . CAD (coronary artery disease), native  coronary artery   . GERD (gastroesophageal reflux disease)   . Angina   . Pneumonia 12/11/11  . Type II diabetes mellitus   . Chronic daily headache     "~ all the time"  . Seizures   . Stroke ~ 1983    "little weaker right hand"  . MRSA (methicillin resistant staph aureus) culture positive Dec. 17, 2013     right heel infection    reports that he quit smoking about 22 years ago. His smoking use included Cigarettes. He has a 20 pack-year smoking history. He quit smokeless tobacco use about 26 years ago. His smokeless tobacco use included Chew. He reports that he does not drink alcohol or use illicit drugs. Family History  Problem Relation Age of Onset  . Hypertension Other   . Cancer Other   . Cancer Sister   . Deep vein thrombosis Brother   . Heart disease Brother   . Deep vein thrombosis Brother   . Heart disease Brother   . Cancer Sister    Family History Substance Abuse: No Family Supports: Yes, List: (wife, son) Living Arrangements: Spouse/significant other Can pt return to current living arrangement?: Yes Abuse/Neglect [facility]) Physical Abuse: Denies Verbal Abuse: Denies Sexual Abuse: Denies Allergies:   Allergies  Allergen Reactions  . Contrast Media [Iodinated Diagnostic Agents] Other (See Comments)    Shaking and feeling terrible    ACT Assessment Complete:  NO Objective: Blood pressure 157/74, pulse 78, temperature 98.6 F (37 C), temperature source Oral, resp. rate 20, height _0  (1.778 m), weight 107.23 kg (236 lb 6.4 oz), SpO2 93 %.Body mass index is 33.92 kg/(m^2). Results for orders placed or performed during the hospital encounter of 07/03/14 (from the past 72 hour(s))  CBG monitoring, ED     Status: Abnormal   Collection Time: 07/03/14  4:25 PM  Result Value Ref Range   Glucose-Capillary 65 (L) 70 - 99 mg/dL  CBG monitoring, ED     Status: Abnormal   Collection Time: 07/03/14 11:21 PM  Result Value Ref Range   Glucose-Capillary 69 (L) 70 - 99 mg/dL    Comment 1 Documented in Chart    Comment 2 Notify RN   CBG monitoring, ED     Status: None   Collection Time: 07/03/14 11:57 PM  Result Value Ref Range   Glucose-Capillary 72 70 - 99 mg/dL   Comment 1 Documented in Chart    Comment 2 Notify RN   CBG monitoring, ED     Status: None   Collection Time: 07/04/14  5:59 AM  Result Value Ref Range   Glucose-Capillary 92 70 - 99 mg/dL   Comment 1 Documented in Chart    Comment 2 Notify RN   CBC     Status: Abnormal   Collection Time: 07/04/14  6:20 PM  Result Value Ref Range   WBC 7.9 4.0 - 10.5 K/uL   RBC 5.53 4.22 -  5.81 MIL/uL   Hemoglobin 14.5 13.0 - 17.0 g/dL   HCT 46.4 39.0 - 52.0 %   MCV 83.9 78.0 - 100.0 fL   MCH 26.2 26.0 - 34.0 pg   MCHC 31.3 30.0 - 36.0 g/dL   RDW 16.9 (H) 11.5 - 15.5 %   Platelets 233 150 - 400 K/uL  Comprehensive metabolic panel     Status: Abnormal   Collection Time: 07/04/14  6:20 PM  Result Value Ref Range   Sodium 145 137 - 147 mEq/L   Potassium 4.6 3.7 - 5.3 mEq/L   Chloride 101 96 - 112 mEq/L   CO2 29 19 - 32 mEq/L   Glucose, Bld 109 (H) 70 - 99 mg/dL   BUN 14 6 - 23 mg/dL   Creatinine, Ser 1.26 0.50 - 1.35 mg/dL   Calcium 9.9 8.4 - 10.5 mg/dL   Total Protein 7.3 6.0 - 8.3 g/dL   Albumin 3.5 3.5 - 5.2 g/dL   AST 11 0 - 37 U/L   ALT 6 0 - 53 U/L   Alkaline Phosphatase 92 39 - 117 U/L   Total Bilirubin 0.8 0.3 - 1.2 mg/dL   GFR calc non Af Amer 55 (L) >90 mL/min   GFR calc Af Amer 64 (L) >90 mL/min    Comment: (NOTE) The eGFR has been calculated using the CKD EPI equation. This calculation has not been validated in all clinical situations. eGFR's persistently <90 mL/min signify possible Chronic Kidney Disease.    Anion gap 15 5 - 15  TSH     Status: None   Collection Time: 07/04/14  6:20 PM  Result Value Ref Range   TSH 0.897 0.350 - 4.500 uIU/mL    Comment: Performed at [facility]  Vitamin B12     Status: Abnormal   Collection Time: 07/04/14  6:20 PM  Result Value Ref  Range   Vitamin B-12 1537 (H) 211 - 911 pg/mL    Comment: Performed at [facility]  RPR     Status: None   Collection Time: 07/04/14  6:20 PM  Result Value Ref Range   RPR NON REAC NON REAC    Comment: Performed at [facility]  Glucose, capillary     Status: None   Collection Time: 07/04/14  6:32 PM  Result Value Ref Range   Glucose-Capillary 93 70 - 99 mg/dL   Comment 1 Notify RN   Glucose, capillary     Status: Abnormal   Collection Time: 07/04/14  9:21 PM  Result Value Ref Range   Glucose-Capillary 113 (H) 70 - 99 mg/dL  Glucose, capillary     Status: Abnormal   Collection Time: 07/05/14  8:04 AM  Result Value Ref Range   Glucose-Capillary 118 (H) 70 - 99 mg/dL  Basic metabolic panel     Status: Abnormal   Collection Time: 07/05/14  8:32 AM  Result Value Ref Range   Sodium 143 137 - 147 mEq/L   Potassium 4.3 3.7 - 5.3 mEq/L   Chloride 100 96 - 112 mEq/L   CO2 24 19 - 32 mEq/L   Glucose, Bld 125 (H) 70 - 99 mg/dL   BUN 19 6 - 23 mg/dL   Creatinine, Ser 1.30 0.50 - 1.35 mg/dL   Calcium 9.5 8.4 - 10.5 mg/dL   GFR calc non Af Amer 53 (L) >90 mL/min   GFR calc Af Amer 62 (L) >90 mL/min    Comment: (NOTE) The eGFR has been  calculated using the CKD EPI equation. This calculation has not been validated in all clinical situations. eGFR's persistently <90 mL/min signify possible Chronic Kidney Disease.    Anion gap 19 (H) 5 - 15  CBC     Status: Abnormal   Collection Time: 07/05/14  8:32 AM  Result Value Ref Range   WBC 10.2 4.0 - 10.5 K/uL   RBC 5.63 4.22 - 5.81 MIL/uL   Hemoglobin 14.5 13.0 - 17.0 g/dL   HCT 46.5 39.0 - 52.0 %   MCV 82.6 78.0 - 100.0 fL   MCH 25.8 (L) 26.0 - 34.0 pg   MCHC 31.2 30.0 - 36.0 g/dL   RDW 16.7 (H) 11.5 - 15.5 %   Platelets 234 150 - 400 K/uL  Glucose, capillary     Status: Abnormal   Collection Time: 07/05/14 11:15 AM  Result Value Ref Range   Glucose-Capillary 139 (H) 70 - 99 mg/dL   Comment 1 Notify RN   MRSA PCR  Screening     Status: None   Collection Time: 07/05/14 12:07 PM  Result Value Ref Range   MRSA by PCR NEGATIVE NEGATIVE    Comment:        The GeneXpert MRSA Assay (FDA approved for NASAL specimens only), is one component of a comprehensive MRSA colonization surveillance program. It is not intended to diagnose MRSA infection nor to guide or monitor treatment for MRSA infections.   Glucose, capillary     Status: Abnormal   Collection Time: 07/05/14  4:32 PM  Result Value Ref Range   Glucose-Capillary 150 (H) 70 - 99 mg/dL   Comment 1 Notify RN   Glucose, capillary     Status: Abnormal   Collection Time: 07/05/14  9:48 PM  Result Value Ref Range   Glucose-Capillary 134 (H) 70 - 99 mg/dL  Glucose, capillary     Status: Abnormal   Collection Time: 07/06/14  7:49 AM  Result Value Ref Range   Glucose-Capillary 141 (H) 70 - 99 mg/dL  CBC     Status: Abnormal   Collection Time: 07/06/14  8:01 AM  Result Value Ref Range   WBC 10.7 (H) 4.0 - 10.5 K/uL   RBC 5.58 4.22 - 5.81 MIL/uL   Hemoglobin 14.4 13.0 - 17.0 g/dL   HCT 45.9 39.0 - 52.0 %   MCV 82.3 78.0 - 100.0 fL   MCH 25.8 (L) 26.0 - 34.0 pg   MCHC 31.4 30.0 - 36.0 g/dL   RDW 16.8 (H) 11.5 - 15.5 %   Platelets 263 150 - 400 K/uL  Basic metabolic panel     Status: Abnormal   Collection Time: 07/06/14  8:01 AM  Result Value Ref Range   Sodium 143 137 - 147 mEq/L    Comment: REPEATED TO VERIFY   Potassium 3.9 3.7 - 5.3 mEq/L   Chloride 99 96 - 112 mEq/L    Comment: REPEATED TO VERIFY   CO2 23 19 - 32 mEq/L    Comment: REPEATED TO VERIFY   Glucose, Bld 155 (H) 70 - 99 mg/dL   BUN 28 (H) 6 - 23 mg/dL   Creatinine, Ser 1.34 0.50 - 1.35 mg/dL   Calcium 9.2 8.4 - 10.5 mg/dL   GFR calc non Af Amer 51 (L) >90 mL/min   GFR calc Af Amer 59 (L) >90 mL/min    Comment: (NOTE) The eGFR has been calculated using the CKD EPI equation. This calculation has not been validated in all clinical situations.  eGFR's persistently <90 mL/min  signify possible Chronic Kidney Disease.    Anion gap 21 (H) 5 - 15    Comment: REPEATED TO VERIFY  Glucose, capillary     Status: Abnormal   Collection Time: 07/06/14 11:42 AM  Result Value Ref Range   Glucose-Capillary 164 (H) 70 - 99 mg/dL   Labs are reviewed .  Current Facility-Administered Medications  Medication Dose Route Frequency Provider Last Rate Last Dose  . 0.9 %  sodium chloride infusion  250 mL Intravenous PRN Belkys A Regalado, MD      . allopurinol (ZYLOPRIM) tablet 300 mg  300 mg Oral q morning - 10a Dot Lanes, MD   300 mg at 07/06/14 1420  . alum & mag hydroxide-simeth (MAALOX/MYLANTA) 200-200-20 MG/5ML suspension 30 mL  30 mL Oral PRN Dot Lanes, MD      . aspirin EC tablet 81 mg  81 mg Oral Daily Dot Lanes, MD   81 mg at 07/06/14 1418  . atenolol (TENORMIN) tablet 50 mg  50 mg Oral BID Dot Lanes, MD   50 mg at 07/06/14 1419  . brimonidine (ALPHAGAN) 0.2 % ophthalmic solution 1 drop  1 drop Both Eyes BID Dot Lanes, MD   1 drop at 07/06/14 1424   And  . timolol (TIMOPTIC) 0.5 % ophthalmic solution 1 drop  1 drop Both Eyes BID Dot Lanes, MD   1 drop at 07/06/14 1423  . cefTRIAXone (ROCEPHIN) 1 g in dextrose 5 % 50 mL IVPB  1 g Intravenous Q24H Belkys A Regalado, MD   1 g at 07/05/14 1600  . clopidogrel (PLAVIX) tablet 75 mg  75 mg Oral q morning - 10a Dot Lanes, MD   75 mg at 07/06/14 1421  . enoxaparin (LOVENOX) injection 40 mg  40 mg Subcutaneous Q24H Belkys A Regalado, MD   40 mg at 07/06/14 1425  . feeding supplement (GLUCERNA SHAKE) (GLUCERNA SHAKE) liquid 237 mL  237 mL Oral BID BM Clayton Bibles, RD   237 mL at 07/06/14 1423  . feeding supplement (PRO-STAT SUGAR FREE 64) liquid 30 mL  30 mL Oral BID WC Clayton Bibles, RD   30 mL at 07/06/14 1422  . furosemide (LASIX) tablet 80 mg  80 mg Oral q morning - 10a Dot Lanes, MD   80 mg at 07/06/14 1420  . haloperidol lactate (HALDOL) injection 4 mg  4 mg Intravenous Q6H PRN  Gardiner Barefoot, NP   4 mg at 07/06/14 1426  . insulin aspart (novoLOG) injection 0-9 Units  0-9 Units Subcutaneous TID WC Robbie Lis, MD   2 Units at 07/06/14 1425  . isosorbide mononitrate (IMDUR) 24 hr tablet 30 mg  30 mg Oral q morning - 10a Dot Lanes, MD   30 mg at 07/06/14 1419  . latanoprost (XALATAN) 0.005 % ophthalmic solution 1 drop  1 drop Both Eyes QHS Dot Lanes, MD   1 drop at 07/05/14 2138  . LORazepam (ATIVAN) injection 1 mg  1 mg Intravenous Q6H PRN Belkys A Regalado, MD   1 mg at 07/06/14 1426  . magnesium oxide (MAG-OX) tablet 400 mg  400 mg Oral Q breakfast Belkys A Regalado, MD   400 mg at 07/06/14 1421  . mirtazapine (REMERON) tablet 15 mg  15 mg Oral QHS Dot Lanes, MD   15 mg at 07/05/14 2138  . multivitamin with minerals tablet 1 tablet  1 tablet  Oral Daily Clayton Bibles, RD   1 tablet at 07/06/14 1420  . ondansetron (ZOFRAN) tablet 4 mg  4 mg Oral Q6H PRN Belkys A Regalado, MD       Or  . ondansetron (ZOFRAN) injection 4 mg  4 mg Intravenous Q6H PRN Belkys A Regalado, MD      . pantoprazole (PROTONIX) EC tablet 40 mg  40 mg Oral Daily Dot Lanes, MD   40 mg at 07/06/14 1422  . perphenazine (TRILAFON) tablet 16 mg  16 mg Oral QHS Dot Lanes, MD   16 mg at 07/05/14 2138  . potassium chloride SA (K-DUR,KLOR-CON) CR tablet 40 mEq  40 mEq Oral Daily Belkys A Regalado, MD   40 mEq at 07/06/14 1421  . sodium chloride 0.9 % injection 3 mL  3 mL Intravenous Q12H Belkys A Regalado, MD   3 mL at 07/05/14 2138  . sodium chloride 0.9 % injection 3 mL  3 mL Intravenous PRN Belkys A Regalado, MD      . tamsulosin (FLOMAX) capsule 0.8 mg  0.8 mg Oral QHS Dot Lanes, MD   0.8 mg at 07/05/14 2137  . temazepam (RESTORIL) capsule 30 mg  30 mg Oral QHS Dot Lanes, MD   30 mg at 07/05/14 2138    Psychiatric Specialty Exam: Physical Exam  Genitourinary: Guaiac positive stool.   as per history and physical  ROS talking himself continuously  Blood  pressure 157/74, pulse 78, temperature 98.6 F (37 C), temperature source Oral, resp. rate 20, height _0  (1.778 m), weight 107.23 kg (236 lb 6.4 oz), SpO2 93 %.Body mass index is 33.92 kg/(m^2).  General Appearance: Bizarre  Eye Contact::  Minimal  Speech:  Garbled and Slurred  Volume:  Increased  Mood:  NA  Affect:  Inappropriate  Thought Process:  Disorganized and Irrelevant  Orientation:  Full (Time, Place, and Person)  Thought Content:  Negative  Suicidal Thoughts:  No  Homicidal Thoughts:  No  Memory:  NA  Judgement:  Poor  Insight:  Lacking  Psychomotor Activity:  Increased and Restlessness  Concentration:  Poor  Recall:  Poor  Fund of Knowledge:Poor  Language: Fair  Akathisia:  NA  Handed:  Right  AIMS (if indicated):     Assets:  Others:  need further assessment  Sleep:      Musculoskeletal: Strength & Muscle Tone: decreased Gait & Station: unable to stand Patient leans: N/A  Treatment Plan Summary: Daily contact with patient to assess and evaluate symptoms and progress in treatment Medication management  Agree with haldol PRN and follow up tomorrow as clinically required.  Matt Delpizzo,JANARDHAHA R. 07/06/2014 3:37 PM

## 2014-07-06 NOTE — Progress Notes (Signed)
Patient ID: Nicholas [name], male   DOB: 1940/12/10, 73 y.o.   MRN: [id number]  [facility] PROGRESS NOTE  Nicholas [name] DOB: 07/01/1941 DOA: 07/03/2014 PCP: Pearla Dubonnet, MD  Brief narrative:    73 y.o. male with PMH significant for diabetes, chronic venous insufficiency, morbid obesity, HTN, stroke, who was brought to ED by Police officer 11-29 because patient has been having AMS, agitation. He has been calling the police multiple times, Per wife patient has been agitated for last month, having hallucinations. This has been getting worse recently. Patient denied chest pain , dyspnea, headaches.   Evaluation in ED: UA with positive nitrates, urine growing 100,000 gram negatives, he has had some hypoglycemic episodes in the ED.  Assessment/Plan:   Encephalopathy; Hallucination, Psychosis;  - This could be secondary to infection process, vs psychiatry disorder - TSH 0.897, B-12: 1537 , Thiamine level pending. , RPR negative.  - MRI showed old stroke, and EEG negative for seizure  - psych consulted, follow up on recommendations  - PRN ativan.  - On Remeron, temazepam Diabetes type II, uncontrolled with complications of neuropathy - with intermittent hypoglycemia  - will ask for A1C as no recent blood test available - will continue to hold Glipizide - placed on SSI only for now  HTN, CAD - Continue with lasix, plavix, atenolol, imdur, aspirin  UTI - continue with ceftriaxone; urine culture growing enterobacter sensitive to ceftriaxone Chronic Diastolic Heart Failure - continue with lasix. Appears compensated Right LE edema - doppler negative for DVT.  Healing pressure 4 sacrum present prior to admission  - continue wound care.   DVT Prophylaxis: Lovenox SQ  Code Status: DNR Family Communication:  No family at bedside  Disposition Plan: Remains inpatient   IV access:   Peripheral IV Procedures and diagnostic studies:     Dg Chest 2 View   07/04/2014 No acute chest findings.    Ct Head Wo Contrast  07/03/2014 Chronic microvascular ischemic change in the white matter. No acute abnormality.    Mr Brain Wo/C  07/04/2014  Chronic microvascular ischemia. Remote lacunar infarcts right basal ganglia. Medical Consultants:   Psych  IAnti-Infectives:    Rocephin 11/29 -->  Manson Passey, MD  [facility] Pager [phone]  If 7PM-7AM, please contact night-coverage [email or web address] Password [email or web address] 07/06/2014, 10:21 AM   LOS: 3 days    HPI/Subjective: No acute overnight events.  Objective: Filed Vitals:   07/05/14 0559 07/05/14 1352 07/05/14 2150 07/06/14 0645  BP: 173/87 162/81 165/84 157/74  Pulse: 80 79 85 78  Temp: 98.3 F (36.8 C) 98.7 F (37.1 C) 98.2 F (36.8 C) 98.6 F (37 C)  TempSrc: Oral Oral Oral Oral  Resp: 20 20 16 20   Height:      Weight:      SpO2: 93% 97% 97% 93%    Intake/Output Summary (Last 24 hours) at 07/06/14 1021 Last data filed at 07/06/14 8657  Gross per 24 hour  Intake    600 ml  Output   2000 ml  Net  -1400 ml    Exam:   General:  Pt is alert, still confused intermittently   Cardiovascular: Regular rate and rhythm, S1/S2  Respiratory: Clear to auscultation bilaterally, no wheezing, no crackles, no rhonchi  Abdomen: Soft, non tender, non distended, bowel sounds present  Extremities: pulses DP and PT palpable bilaterally  Neuro: Grossly nonfocal  Data Reviewed: Basic Metabolic Panel:  Recent Labs Lab 07/03/14 1021 07/04/14 1820 07/05/14 0832 07/06/14 0801  NA 139 145 143 143  K 4.4 4.6 4.3 3.9  CL 102 101 100 99  CO2 26 29 24 23   GLUCOSE 67* 109* 125* 155*  BUN 14 14 19  28*  CREATININE 1.14 1.26 1.30 1.34  CALCIUM 9.1 9.9 9.5 9.2   Liver Function Tests:  Recent Labs Lab 07/03/14 1021 07/04/14 1820  AST 11 11  ALT 5 6  ALKPHOS 77 92  BILITOT 0.3 0.8  PROT 6.3 7.3  ALBUMIN 3.0* 3.5    Recent Labs Lab 07/03/14 1021  AMMONIA 21    CBC:  Recent Labs Lab 07/03/14 1021 07/04/14 1820 07/05/14 0832 07/06/14 0801  WBC 8.7 7.9 10.2 10.7*  NEUTROABS 4.5  --   --   --   HGB 12.2* 14.5 14.5 14.4  HCT 40.3 46.4 46.5 45.9  MCV 83.8 83.9 82.6 82.3  PLT 211 233 234 263   CBG:  Recent Labs Lab 07/05/14 0804 07/05/14 1115 07/05/14 1632 07/05/14 2148 07/06/14 0749  GLUCAP 118* 139* 150* 134* 141*    Recent Results (from the past 240 hour(s))  Urine culture     Status: None   Collection Time: 07/03/14  9:54 AM  Result Value Ref Range Status   Specimen Description URINE, CATHETERIZED  Final   Special Requests Normal  Final   Culture  Setup Time   Final    07/03/2014 15:30 Performed at [facility]    Colony Count   Final    >=100,000 COLONIES/ML Performed at [facility]    Culture   Final    ENTEROBACTER AEROGENES Performed at [facility]    Report Status 07/05/2014 FINAL  Final   Organism ID, Bacteria ENTEROBACTER AEROGENES  Final      Susceptibility   Enterobacter aerogenes - MIC*    CEFAZOLIN >=64 RESISTANT Resistant     CEFTRIAXONE <=1 SENSITIVE Sensitive     CIPROFLOXACIN <=0.25 SENSITIVE Sensitive     GENTAMICIN <=1 SENSITIVE Sensitive     LEVOFLOXACIN <=0.12 SENSITIVE Sensitive     NITROFURANTOIN <=16 SENSITIVE Sensitive     TOBRAMYCIN <=1 SENSITIVE Sensitive     TRIMETH/SULFA <=20 SENSITIVE Sensitive     PIP/TAZO <=4 SENSITIVE Sensitive     * ENTEROBACTER AEROGENES  MRSA PCR Screening     Status: None   Collection Time: 07/05/14 12:07 PM  Result Value Ref Range Status   MRSA by PCR NEGATIVE NEGATIVE Final    Comment:        The GeneXpert MRSA Assay (FDA approved for NASAL specimens only), is one component of a comprehensive MRSA colonization surveillance program. It is not intended to diagnose MRSA infection nor to guide or monitor treatment for MRSA infections.      Scheduled Meds: . allopurinol  300 mg Oral q morning - 10a  . aspirin EC  81 mg  Oral Daily  . atenolol  50 mg Oral BID  . cefTRIAXone   IV  1 g Intravenous Q24H  . clopidogrel  75 mg Oral q morning - 10a  . enoxaparin injection  40 mg Subcutaneous Q24H  . furosemide  80 mg Oral q morning - 10a  . isosorbide mononitrate  30 mg Oral q morning - 10a  . latanoprost  1 drop Both Eyes QHS  . magnesium oxide  400 mg Oral Q breakfast  . mirtazapine  15 mg Oral QHS  . pantoprazole  40 mg Oral Daily  . perphenazine  16 mg Oral QHS  .  potassium chloride SA  40 mEq Oral Daily  . tamsulosin  0.8 mg Oral QHS  . temazepam  30 mg Oral QHS   Continuous Infusions:

## 2014-07-07 LAB — CBC
HCT: 45.5 % (ref 39.0–52.0)
HEMOGLOBIN: 13.8 g/dL (ref 13.0–17.0)
MCH: 25.5 pg — ABNORMAL LOW (ref 26.0–34.0)
MCHC: 30.3 g/dL (ref 30.0–36.0)
MCV: 84.1 fL (ref 78.0–100.0)
Platelets: 252 10*3/uL (ref 150–400)
RBC: 5.41 MIL/uL (ref 4.22–5.81)
RDW: 17.1 % — ABNORMAL HIGH (ref 11.5–15.5)
WBC: 11.6 10*3/uL — ABNORMAL HIGH (ref 4.0–10.5)

## 2014-07-07 LAB — GLUCOSE, CAPILLARY
GLUCOSE-CAPILLARY: 135 mg/dL — AB (ref 70–99)
GLUCOSE-CAPILLARY: 139 mg/dL — AB (ref 70–99)
Glucose-Capillary: 156 mg/dL — ABNORMAL HIGH (ref 70–99)
Glucose-Capillary: 154 mg/dL — ABNORMAL HIGH (ref 70–99)
Glucose-Capillary: 156 mg/dL — ABNORMAL HIGH (ref 70–99)

## 2014-07-07 LAB — BASIC METABOLIC PANEL
Anion gap: 19 — ABNORMAL HIGH (ref 5–15)
BUN: 37 mg/dL — ABNORMAL HIGH (ref 6–23)
CO2: 27 mEq/L (ref 19–32)
Calcium: 9.5 mg/dL (ref 8.4–10.5)
Chloride: 100 mEq/L (ref 96–112)
Creatinine, Ser: 1.47 mg/dL — ABNORMAL HIGH (ref 0.50–1.35)
GFR calc Af Amer: 53 mL/min — ABNORMAL LOW (ref >90)
GFR, EST NON AFRICAN AMERICAN: 46 mL/min — AB (ref >90)
Glucose, Bld: 165 mg/dL — ABNORMAL HIGH (ref 70–99)
POTASSIUM: 3.9 meq/L (ref 3.7–5.3)
SODIUM: 146 meq/L (ref 137–147)

## 2014-07-07 LAB — VITAMIN B1: VITAMIN B1 (THIAMINE): 10 nmol/L (ref 8–30)

## 2014-07-07 MED ORDER — FUROSEMIDE 40 MG PO TABS
40.0000 mg | ORAL_TABLET | Freq: Every morning | ORAL | Status: DC
  Administered 2014-07-08: 40 mg via ORAL
  Filled 2014-07-07: qty 1

## 2014-07-07 NOTE — Progress Notes (Addendum)
Clinical Social Work Department CLINICAL SOCIAL WORK PLACEMENT NOTE 07/07/2014  Patient:  Nicholas CooterOXENDINE,[name]  Account Number:  [id number] Admit date:  07/03/2014  Clinical Social Worker:  Unk LightningHOLLY Bryanne Riquelme, LCSW  Date/time:  07/07/2014 10:15 AM  Clinical Social Work is seeking post-discharge placement for this patient at the following level of care:   SKILLED NURSING   (*CSW will update this form in Epic as items are completed)   07/07/2014  Patient/family provided with [facility] of Clinical [facility] Work's list of facilities offering this level of care within the geographic area requested by the patient (or if unable, by the patient's family).  07/07/2014  Patient/family informed of their freedom to choose among providers that offer the needed level of care, that participate in Medicare, Medicaid or managed care program needed by the patient, have an available bed and are willing to accept the patient.  07/07/2014  Patient/family informed of [facility]' ownership interest in [facility], as well as of the fact that they are under no obligation to receive care at this facility.  PASARR submitted to EDS on 07/07/2014 PASARR number received on 07/07/2014  FL2 transmitted to all facilities in geographic area requested by pt/family on  07/07/2014 FL2 transmitted to all facilities within larger geographic area on   Patient informed that his/her managed care company has contracts with or will negotiate with  certain facilities, including the following:     Patient/family informed of bed offers received:  07/08/14 [facility] Physician recommends and patient chooses bed at    [facility] on  07/08/14 Patient to be transferred to facility by [facility] Patient and family notified of transfer on 07/08/2014 Name of family member notified:  [facility] via phone  The following physician request were entered in Epic:   Additional  Comments: 12/3- Clinicals submitted to [facility] for review

## 2014-07-07 NOTE — Progress Notes (Addendum)
Clinical Social Work  Patient was discussed during progression meeting re: patient requiring SNF placement for therapy and wound care needs. CSW met with patient's wife and son at bedside in order to discuss DC plans. Patient was living at home with family prior to admission but wife reports she can no longer care for patient. CSW provided SNF list and explained process. CSW explained Medicare coverage for SNF and encouraged family to apply for Medicaid if they were concerned about possible LT placement. Wife reports she wants to think about options and will apply for Medicaid if she feels patient cannot return home. Wife has CSW contact information and is agreeable to [place] search. CSW will follow up with bed offers.   , LCSW [phone]  Addendum 1320 CSW provided bed offers of [facility] and [facility] to wife. Wife reports she will tour facilities and have a choice in the morning in case DC occurs tomorrow. 

## 2014-07-07 NOTE — Progress Notes (Signed)
Patient ID: Nicholas [name], male   DOB: 30-Sep-1940, 73 y.o.   MRN: [id number] [facility] PROGRESS NOTE  Nicholas [name] WUJ:[id number] DOB: 30-Sep-1940 DOA: 07/03/2014 PCP: Pearla DubonnetGATES,ROBERT NEVILL, MD  Brief narrative:    73 y.o. male with PMH significant for diabetes, chronic venous insufficiency, morbid obesity, HTN, stroke, who was brought to ED by Police officer 11-29 because patient has been having AMS, agitation. He has been calling the police multiple times, Per wife patient has been agitated for last month, having hallucinations. This has been getting worse recently. Patient denied chest pain , dyspnea, headaches.   Evaluation in ED: UA with positive nitrates, urine growing 100,000 gram negatives, he has had some hypoglycemic episodes in the ED.  Assessment/Plan:   Encephalopathy; Hallucination, Psychosis;  - This could be secondary to infection process, vs psychiatry disorder - TSH 0.897, B-12: 1537,  RPR negative.  - MRI showed old stroke, and EEG negative for seizure  - psych consulted, follow up on recommendations  - PRN ativan.  - On Remeron, temazepam Diabetes type II, uncontrolled with complications of neuropathy - with intermittent hypoglycemia  - A1C 6.1 - will continue to hold Glipizide - continue SSI only for now Acute renal insufficiency  - secondary to Lasix - will lower the dose from 80 mg PO QD --> 40 mg PO QD (12/03) - repeat BMP in AM HTN, CAD - Continue with lasix, plavix, atenolol, imdur, aspirin  UTI - continue with ceftriaxone; urine culture growing enterobacter sensitive to ceftriaxone Chronic Diastolic Heart Failure - continue with lasix but will lower the dose  Right LE edema - doppler negative for DVT.  Healing pressure 4 sacrum present prior to admission  - continue wound care.   DVT Prophylaxis: Lovenox SQ  Code Status: DNR Family Communication: No family at bedside  Disposition Plan: Remains inpatient   IV access:    Peripheral IV Procedures and diagnostic studies:    Dg Chest 2 View 07/04/2014 No acute chest findings.   Ct Head Wo Contrast 07/03/2014 Chronic microvascular ischemic change in the white matter. No acute abnormality.   Mr Brain Wo/C 07/04/2014 Chronic microvascular ischemia. Remote lacunar infarcts right basal ganglia. Medical Consultants:   Psych  IAnti-Infectives:    Rocephin 11/29 -->   Manson PasseyEVINE, Amarius Toto, MD  [facility] Pager [phone]  If 7PM-7AM, please contact night-coverage [email or web address] Password [email or web address] 07/07/2014, 10:39 AM   LOS: 4 days    HPI/Subjective: No acute overnight events.  Objective: Filed Vitals:   07/05/14 2150 07/06/14 0645 07/06/14 2047 07/07/14 0533  BP: 165/84 157/74 131/57 132/80  Pulse: 85 78 76 61  Temp: 98.2 F (36.8 C) 98.6 F (37 C) 99.2 F (37.3 C) 97.3 F (36.3 C)  TempSrc: Oral Oral Oral Oral  Resp: 16 20 22 22   Height:      Weight:      SpO2: 97% 93% 95% 99%    Intake/Output Summary (Last 24 hours) at 07/07/14 1039 Last data filed at 07/07/14 [id number]  Gross per 24 hour  Intake      0 ml  Output    725 ml  Net   -725 ml    Exam:   General:  Pt is alert, follows commands appropriately, not in acute distress  Cardiovascular: Regular rate and rhythm, S1/S2  Respiratory: Clear to auscultation bilaterally, no wheezing, no crackles, no rhonchi  Abdomen: Soft, non tender, non distended, bowel sounds present  Extremities: pulses DP and PT palpable bilaterally  Neuro: Grossly nonfocal  Data Reviewed: Basic Metabolic Panel:  Recent Labs Lab 07/03/14 1021 07/04/14 1820 07/05/14 0832 07/06/14 0801 07/07/14 0655  NA 139 145 143 143 146  K 4.4 4.6 4.3 3.9 3.9  CL 102 101 100 99 100  CO2 26 29 24 23 27   GLUCOSE 67* 109* 125* 155* 165*  BUN 14 14 19  28* 37*  CREATININE 1.14 1.26 1.30 1.34 1.47*  CALCIUM 9.1 9.9 9.5 9.2 9.5   Liver Function Tests:  Recent Labs Lab 07/03/14 1021 07/04/14 1820   AST 11 11  ALT 5 6  ALKPHOS 77 92  BILITOT 0.3 0.8  PROT 6.3 7.3  ALBUMIN 3.0* 3.5    Recent Labs Lab 07/03/14 1021  AMMONIA 21   CBC:  Recent Labs Lab 07/03/14 1021 07/04/14 1820 07/05/14 0832 07/06/14 0801 07/07/14 0655  WBC 8.7 7.9 10.2 10.7* 11.6*  NEUTROABS 4.5  --   --   --   --   HGB 12.2* 14.5 14.5 14.4 13.8  HCT 40.3 46.4 46.5 45.9 45.5  MCV 83.8 83.9 82.6 82.3 84.1  PLT 211 233 234 263 252   CBG:  Recent Labs Lab 07/06/14 1142 07/06/14 1648 07/06/14 2043 07/07/14 0225 07/07/14 0746  GLUCAP 164* 182* 149* 156* 154*    Recent Results (from the past 240 hour(s))  Urine culture     Status: None   Collection Time: 07/03/14  9:54 AM  Result Value Ref Range Status    >=100,000 COLONIES/ML Performed at [facility]     ENTEROBACTER AEROGENES       Susceptibility   Enterobacter aerogenes - MIC*    CEFAZOLIN >=64 RESISTANT Resistant     CEFTRIAXONE <=1 SENSITIVE Sensitive     CIPROFLOXACIN <=0.25 SENSITIVE Sensitive     GENTAMICIN <=1 SENSITIVE Sensitive     LEVOFLOXACIN <=0.12 SENSITIVE Sensitive     NITROFURANTOIN <=16 SENSITIVE Sensitive     TOBRAMYCIN <=1 SENSITIVE Sensitive     TRIMETH/SULFA <=20 SENSITIVE Sensitive     PIP/TAZO <=4 SENSITIVE Sensitive     * ENTEROBACTER AEROGENES  MRSA PCR Screening     Status: None   Collection Time: 07/05/14 12:07 PM  Result Value Ref Range Status   MRSA by PCR NEGATIVE NEGATIVE Final     Scheduled Meds: . allopurinol  300 mg Oral q morning - 10a  . aspirin EC  81 mg Oral Daily  . atenolol  50 mg Oral BID  . cefTRIAXone IV  1 g Intravenous Q24H  . clopidogrel  75 mg Oral q morning - 10a  . enoxaparin  injection  40 mg Subcutaneous Q24H  . furosemide  80 mg Oral q morning - 10a  . insulin aspart  0-9 Units Subcutaneous TID WC  . isosorbide mononitrate  30 mg Oral q morning - 10a  . latanoprost  1 drop Both Eyes QHS  . magnesium oxide  400 mg Oral Q breakfast  . mirtazapine  15 mg Oral  QHS  . pantoprazole  40 mg Oral Daily  . perphenazine  16 mg Oral QHS  . potassium chloride SA  40 mEq Oral Daily  . tamsulosin  0.8 mg Oral QHS  . temazepam  30 mg Oral QHS   Continuous Infusions:

## 2014-07-07 NOTE — Evaluation (Signed)
Occupational Therapy Evaluation Patient Details Name: Nicholas CooterJames D [name] MRN: [id number] DOB: 09-30-1940 Today's Date: 07/07/2014    History of Present Illness 73 y.o. male with PMH significant for diabetes, chronic venous insufficiency, morbid obesity, HTN, stroke, CABG, left AKA who was brought to ED by Police officer 07/03/14 because patient has been having AMS and agitation.    Clinical Impression   Pt was admitted for the above.  Pt was cooperative during the evaluation, but he had decreased sustained attention, decreased safety, inappropriate behavior (spitting on floor) and decreased insight.  He was oriented to self and place.  He will benefit from skilled OT to increase safety and independence with ADLs.  Uncertain of pt's PLOF    Follow Up Recommendations  SNF    Equipment Recommendations   (to be further assessed)    Recommendations for Other Services       Precautions / Restrictions Precautions Precautions: Fall Restrictions Weight Bearing Restrictions: No      Mobility Bed Mobility     Rolling: Mod assist;Max assist   Supine to sit: +2 for physical assistance;Max assist Sit to supine: +2 for physical assistance;Max assist   General bed mobility comments: pt rolled to L with mod A and R with max A, second person needed to manage pad.  Assist for leg and trunk to sit up  Transfers                 General transfer comment: not tested:  unsafe    Balance   Sitting-balance support: Feet supported;Bilateral upper extremity supported Sitting balance-Leahy Scale: Fair Sitting balance - Comments: min guard sitting EOB x 10 minuttes                                    ADL Overall ADL's : Needs assistance/impaired     Grooming: Oral care;Wash/dry face;Minimal assistance;Moderate assistance;Bed level (min A thoroughness face; mod A swabbing teeth)                                 General ADL Comments: pt has decreased sustained  attention:  internally distracted.  followed most 1 step commands with extra time and multimodal cues.  Needed to have items placed in hand.  Spit onto floor several times after swabbing mouth.  Sat unsupported eob x 10 minutes with min guard; used bil UEs for support.  Unable to assist with scooting to head of bed.  Laid down fast without warning:  Assistance to guard head and help LB. Pt needs overall mod A for UB adls and max A for LB adls with A x 2 for rolling/safety     Vision                     Perception     Praxis      Pertinent Vitals/Pain Pain Assessment: Faces Faces Pain Scale: No hurt     Hand Dominance     Extremity/Trunk Assessment Upper Extremity Assessment Upper Extremity Assessment: Generalized weakness (uncoordinated, awkward movements)           Communication Communication Communication: No difficulties   Cognition Arousal/Alertness: Awake/alert (eyes closed for most of session)   Overall Cognitive Status: No family/caregiver present to determine baseline cognitive functioning  General Comments: decreased attention; tangential and talkative but what he was saying was not accurate.  Did know he was in hospital.     General Comments       Exercises       Shoulder Instructions      Home Living Family/patient expects to be discharged to:: Unsure                                 Additional Comments: will need stsnf      Prior Functioning/Environment Level of Independence: Needs assistance        Comments: pt is a poor historian:  uncertain of amount of assistance needed    OT Diagnosis: Generalized weakness;Cognitive deficits   OT Problem List: Decreased strength;Decreased activity tolerance;Decreased coordination;Decreased cognition;Decreased safety awareness;Impaired UE functional use   OT Treatment/Interventions: Self-care/ADL training;DME and/or AE instruction;Patient/family education;Cognitive  remediation/compensation;Therapeutic activities    OT Goals(Current goals can be found in the care plan section) Acute Rehab OT Goals Patient Stated Goal: none stated; agreeable to working with OT OT Goal Formulation: With patient Time For Goal Achievement: 07/21/14 Potential to Achieve Goals: Fair ADL Goals Pt Will Perform Grooming: sitting;with min assist (teeth, supported sitting) Pt Will Perform Upper Body Bathing: with min assist;sitting (supported) Additional ADL Goal #1: pt will perform bed mobility with min A x 2 for safety in preparation for seated adls Additional ADL Goal #2: Pt will sustain attention to task for 3 minutes without redirection  OT Frequency: Min 2X/week   Barriers to D/C:            Co-evaluation              End of Session    Activity Tolerance: Patient tolerated treatment well Patient left: in bed;with call bell/phone within reach;with bed alarm set   Time: [id number] OT Time Calculation (min): 26 min Charges:  OT General Charges $OT Visit: 1 Procedure OT Evaluation $Initial OT Evaluation Tier I: 1 Procedure OT Treatments $Self Care/Home Management : 8-22 mins $Therapeutic Activity: 8-22 mins G-Codes:    Ridwan Bondy 07/07/2014, 3:56 PM  Marica OtterMaryellen Elonzo Sopp, OTR/L [phone] 07/07/2014

## 2014-07-08 DIAGNOSIS — R4182 Altered mental status, unspecified: Secondary | ICD-10-CM | POA: Insufficient documentation

## 2014-07-08 DIAGNOSIS — N390 Urinary tract infection, site not specified: Secondary | ICD-10-CM

## 2014-07-08 LAB — CBC
HEMATOCRIT: 45.7 % (ref 39.0–52.0)
HEMOGLOBIN: 13.9 g/dL (ref 13.0–17.0)
MCH: 26.1 pg (ref 26.0–34.0)
MCHC: 30.4 g/dL (ref 30.0–36.0)
MCV: 85.7 fL (ref 78.0–100.0)
Platelets: 247 10*3/uL (ref 150–400)
RBC: 5.33 MIL/uL (ref 4.22–5.81)
RDW: 17.4 % — ABNORMAL HIGH (ref 11.5–15.5)
WBC: 11.2 10*3/uL — ABNORMAL HIGH (ref 4.0–10.5)

## 2014-07-08 LAB — BASIC METABOLIC PANEL
Anion gap: 16 — ABNORMAL HIGH (ref 5–15)
BUN: 42 mg/dL — AB (ref 6–23)
CHLORIDE: 106 meq/L (ref 96–112)
CO2: 26 meq/L (ref 19–32)
Calcium: 9.3 mg/dL (ref 8.4–10.5)
Creatinine, Ser: 1.25 mg/dL (ref 0.50–1.35)
GFR calc Af Amer: 65 mL/min — ABNORMAL LOW (ref >90)
GFR calc non Af Amer: 56 mL/min — ABNORMAL LOW (ref >90)
Glucose, Bld: 151 mg/dL — ABNORMAL HIGH (ref 70–99)
POTASSIUM: 3.8 meq/L (ref 3.7–5.3)
SODIUM: 148 meq/L — AB (ref 137–147)

## 2014-07-08 LAB — GLUCOSE, CAPILLARY
GLUCOSE-CAPILLARY: 136 mg/dL — AB (ref 70–99)
GLUCOSE-CAPILLARY: 183 mg/dL — AB (ref 70–99)

## 2014-07-08 MED ORDER — PRO-STAT SUGAR FREE PO LIQD: 30.0000 mL | Freq: Two times a day (BID) | ORAL | Status: AC

## 2014-07-08 MED ORDER — HYDROCODONE-ACETAMINOPHEN 10-325 MG PO TABS: 1.0000 | ORAL_TABLET | Freq: Three times a day (TID) | ORAL | Status: DC

## 2014-07-08 MED ORDER — ADULT MULTIVITAMIN W/MINERALS CH: 1.0000 | ORAL_TABLET | Freq: Every day | ORAL | Status: AC

## 2014-07-08 MED ORDER — ALUM & MAG HYDROXIDE-SIMETH 200-200-20 MG/5ML PO SUSP: 30.0000 mL | ORAL | Status: AC | PRN

## 2014-07-08 MED ORDER — MIRTAZAPINE 15 MG PO TABS: 15.0000 mg | ORAL_TABLET | Freq: Every day | ORAL | Status: AC

## 2014-07-08 MED ORDER — PANTOPRAZOLE SODIUM 40 MG PO TBEC: 40.0000 mg | DELAYED_RELEASE_TABLET | Freq: Every day | ORAL | Status: DC

## 2014-07-08 MED ORDER — ONDANSETRON HCL 4 MG PO TABS: 4.0000 mg | ORAL_TABLET | Freq: Four times a day (QID) | ORAL | Status: AC | PRN

## 2014-07-08 MED ORDER — POTASSIUM CHLORIDE CRYS ER 20 MEQ PO TBCR: 20.0000 meq | EXTENDED_RELEASE_TABLET | Freq: Every day | ORAL | Status: DC

## 2014-07-08 MED ORDER — GLUCERNA SHAKE PO LIQD: 237.0000 mL | Freq: Two times a day (BID) | ORAL | Status: AC

## 2014-07-08 MED ORDER — NITROFURANTOIN MONOHYD MACRO 100 MG PO CAPS: 100.0000 mg | ORAL_CAPSULE | Freq: Two times a day (BID) | ORAL | Status: DC

## 2014-07-08 MED ORDER — RISPERIDONE 0.5 MG PO TABS: 1.0000 mg | ORAL_TABLET | Freq: Every day | ORAL | Status: AC

## 2014-07-08 MED ORDER — FUROSEMIDE 40 MG PO TABS: 40.0000 mg | ORAL_TABLET | Freq: Every morning | ORAL | Status: DC

## 2014-07-08 MED ORDER — INSULIN ASPART 100 UNIT/ML ~~LOC~~ SOLN: 0.0000 [IU] | Freq: Three times a day (TID) | SUBCUTANEOUS | Status: DC

## 2014-07-08 NOTE — Progress Notes (Signed)
Clinical Social Work  CSW spoke with wife who reports [facility] has [facility] and already completed paperwork. CSW faxed DC summary to SNF and spoke with admissions who is agreeable to accept today. CSW prepared DC packet with FL2, DC summary, DNR, and hard scripts included. Wife prefers [facility] to provide transportation and is aware of no guarantee of payment. RN to call report to SNF. [facility] scheduled for 12:30pm. [facility] #: [phone].  CSW is signing off but available if needed.  Oglala[place]olly Seena Ritacco, [place]CS[place] [phone]

## 2014-07-08 NOTE — Progress Notes (Signed)
Writer gave report to receiving nurse [facility].

## 2014-07-08 NOTE — Progress Notes (Signed)
[facility] Family aware of transfer

## 2014-07-08 NOTE — Discharge Instructions (Signed)

## 2014-07-08 NOTE — Plan of Care (Signed)
Problem: Discharge Progression Outcomes Goal: Barriers To Progression Addressed/Resolved Outcome: Completed/Met Date Met:  07/08/14 Goal: Discharge plan in place and appropriate Outcome: Completed/Met Date Met:  07/08/14 Goal: Pain controlled with appropriate interventions Outcome: Completed/Met Date Met:  07/08/14 Goal: Hemodynamically stable Outcome: Completed/Met Date Met:  88/41/66 Goal: Complications resolved/controlled Outcome: Progressing Goal: Tolerating diet Outcome: Completed/Met Date Met:  07/08/14 Goal: Activity appropriate for discharge plan Outcome: Progressing Goal: Temperature < 100 x 24 hrs on PO antibiotics Outcome: Completed/Met Date Met:  07/08/14 Goal: Other Discharge Outcomes/Goals Outcome: Completed/Met Date Met:  07/08/14

## 2014-07-08 NOTE — Discharge Summary (Signed)
Physician Discharge Summary  Nicholas [name]:[id number] DOB: Sep 08, 1940 DOA: 07/03/2014  PCP: Pearla Dubonnet, MD  Admit date: 07/03/2014 Discharge date: 07/08/2014  Recommendations for Outpatient Follow-up:  1. Take Macrobid for 2 more days on discharge for treatment of Enterobacter urinary tract infection.  Discharge Diagnoses:  Active Problems:   CAD (coronary artery disease), native coronary artery   DNR (do not resuscitate)   Chronic diastolic heart failure   Type 2 diabetes mellitus with diabetic nephropathy   UTI (lower urinary tract infection)   Encephalopathy acute   Hallucination   Altered mental status    Discharge Condition: stable   Diet recommendation: as tolerated   History of present illness:  73 y.o. male with PMH significant for diabetes, chronic venous insufficiency, morbid obesity, HTN, stroke, who was brought to ED by Police officer 11-29 because patient has been having AMS, agitation. He has been calling the police multiple times, Per wife patient has been agitated for last month, having hallucinations. This has been getting worse recently. Patient denied chest pain , dyspnea, headaches.   Evaluation in ED: UA with positive nitrates, urine growing 100,000 gram negatives, he has had some hypoglycemic episodes in the ED.  Assessment/Plan:   Encephalopathy; Hallucination, Psychosis;  - This could be secondary to infection process, vs psychiatry disorder - TSH 0.897, B-12: 1537, RPR negative.  - MRI showed old stroke, and EEG negative for seizure  - psych consulted. - On Remeron at bedtime; will stop temazepam to avoid duplications Diabetes type II, uncontrolled with complications of neuropathy - with intermittent hypoglycemia  - A1C 6.1 - Patient can continue taking sliding scale insulin only. Acute renal insufficiency  - secondary to Lasix - The dose was lowered from 80 mg PO QD --> 40 mg PO QD (12/03) - Patient can continue Lasix 40  mg daily HTN, CAD - Continue with lasix, plavix, atenolol, imdur, aspirin  UTI, Enterobacter - Patient was on Rocephin. Based on sensitivity report we can also give Macrobid which we will prescribe on discharge for 2 more days. Chronic Diastolic Heart Failure - continue with lasix but with lower dose, 40 mg daily Right LE edema - doppler negative for DVT.  Healing pressure 4 sacrum present prior to admission  - continue wound care.   DVT Prophylaxis: Lovenox SQ  Code Status: DNR/ DNI Family Communication: No family at bedside    IV access:   Peripheral IV Procedures and diagnostic studies:    Dg Chest 2 View 07/04/2014 No acute chest findings.   Ct Head Wo Contrast 07/03/2014 Chronic microvascular ischemic change in the white matter. No acute abnormality.   Mr Brain Wo/C 07/04/2014 Chronic microvascular ischemia. Remote lacunar infarcts right basal ganglia. Medical Consultants:   Psych  IAnti-Infectives:    Rocephin 11/29 --> 07/08/2014   Macrobid on discharge for 2 days   Signed:  Manson Passey, MD  [facility] 07/08/2014, 10:00 AM  Pager #: [phone]   Discharge Exam: Filed Vitals:   07/08/14 0440  BP: 159/66  Pulse: 64  Temp: 98.9 F (37.2 C)  Resp: 16   Filed Vitals:   07/07/14 1337 07/07/14 1615 07/07/14 2132 07/08/14 0440  BP:  142/62 139/79 159/66  Pulse:  100 70 64  Temp:  99.5 F (37.5 C) 99.8 F (37.7 C) 98.9 F (37.2 C)  TempSrc:  Axillary Oral Oral  Resp:  22 14 16   Height:      Weight: 102.694 kg (226 lb 6.4 oz)   101.833 kg (  224 lb 8 oz)  SpO2:  92% 95% 96%    Exam:   General: Pt is not in acute distress  Cardiovascular: Regular rate and rhythm, S1/S2 appreciated   Respiratory: no wheezing, no crackles, no rhonchi  Abdomen: non distended, bowel sounds present  Extremities: pulses DP and PT palpable bilaterally  Neuro: nonfocal  Discharge Instructions  Discharge Instructions     Call MD for:  difficulty breathing, headache or visual disturbances    Complete by:  As directed      Call MD for:  persistant dizziness or light-headedness    Complete by:  As directed      Call MD for:  redness, tenderness, or signs of infection (pain, swelling, redness, odor or green/yellow discharge around incision site)    Complete by:  As directed      Call MD for:  severe uncontrolled pain    Complete by:  As directed      Diet - low sodium heart healthy    Complete by:  As directed      Discharge instructions    Complete by:  As directed   1. Take Macrobid for 2 more days on discharge for treatment of Enterobacter urinary tract infection.     Increase activity slowly    Complete by:  As directed             Medication List    STOP taking these medications        ALPRAZolam 1 MG tablet  Commonly known as:  XANAX     glipiZIDE 5 MG 24 hr tablet  Commonly known as:  GLUCOTROL XL     Influenza Vac Split Quad 0.25 ML injection  Commonly known as:  FLUZONE     insulin glargine 100 UNIT/ML injection  Commonly known as:  LANTUS     omeprazole 20 MG capsule  Commonly known as:  PRILOSEC  Replaced by:  pantoprazole 40 MG tablet     oxyCODONE-acetaminophen 10-325 MG per tablet  Commonly known as:  PERCOCET     pneumococcal 13-valent conjugate vaccine Susp injection  Commonly known as:  PREVNAR 13     temazepam 30 MG capsule  Commonly known as:  RESTORIL      TAKE these medications        allopurinol 300 MG tablet  Commonly known as:  ZYLOPRIM  Take 1 tablet (300 mg total) by mouth every morning.     alum & mag hydroxide-simeth 200-200-20 MG/5ML suspension  Commonly known as:  MAALOX/MYLANTA  Take 30 mLs by mouth as needed for indigestion or heartburn.     aspirin 81 MG EC tablet  Take 1 tablet (81 mg total) by mouth daily.     atenolol 50 MG tablet  Commonly known as:  TENORMIN  Take 1 tablet (50 mg total) by mouth 2 (two) times daily.      brimonidine-timolol 0.2-0.5 % ophthalmic solution  Commonly known as:  COMBIGAN  Place 1 drop into both eyes every 12 (twelve) hours.     clopidogrel 75 MG tablet  Commonly known as:  PLAVIX  Take 1 tablet (75 mg total) by mouth every morning.     diclofenac sodium 1 % Gel  Commonly known as:  VOLTAREN  Apply 1 application topically 4 (four) times daily as needed. Applies to both knees as needed for pain     feeding supplement (GLUCERNA SHAKE) Liqd  Take 237 mLs by mouth 2 (two) times daily between meals.  feeding supplement (PRO-STAT SUGAR FREE 64) Liqd  Take 30 mLs by mouth 2 (two) times daily with a meal.     furosemide 40 MG tablet  Commonly known as:  LASIX  Take 1 tablet (40 mg total) by mouth every morning.     HYDROcodone-acetaminophen 10-325 MG per tablet  Commonly known as:  NORCO  Take 1 tablet by mouth 3 (three) times daily.     insulin aspart 100 UNIT/ML injection  Commonly known as:  novoLOG  Inject 0-9 Units into the skin 3 (three) times daily with meals.     isosorbide mononitrate 30 MG 24 hr tablet  Commonly known as:  IMDUR  Take 1 tablet (30 mg total) by mouth every morning.     latanoprost 0.005 % ophthalmic solution  Commonly known as:  XALATAN  Place 1 drop into both eyes at bedtime.     magnesium oxide 400 MG tablet  Commonly known as:  MAG-OX  Take 400 mg by mouth daily with breakfast.     mirtazapine 15 MG tablet  Commonly known as:  REMERON  Take 1 tablet (15 mg total) by mouth at bedtime.     multivitamin with minerals Tabs tablet  Take 1 tablet by mouth daily.     nitrofurantoin (macrocrystal-monohydrate) 100 MG capsule  Commonly known as:  MACROBID  Take 1 capsule (100 mg total) by mouth 2 (two) times daily.     nitroGLYCERIN 0.4 MG SL tablet  Commonly known as:  NITROSTAT  Place 1 tablet (0.4 mg total) under the tongue every 5 (five) minutes as needed. For chest pain     ondansetron 4 MG tablet  Commonly known as:  ZOFRAN   Take 1 tablet (4 mg total) by mouth every 6 (six) hours as needed for nausea.     pantoprazole 40 MG tablet  Commonly known as:  PROTONIX  Take 1 tablet (40 mg total) by mouth daily.     perphenazine 8 MG tablet  Commonly known as:  TRILAFON  Take 16 mg by mouth at bedtime.     potassium chloride SA 20 MEQ tablet  Commonly known as:  K-DUR,KLOR-CON  Take 1 tablet (20 mEq total) by mouth daily.     risperiDONE 0.5 MG tablet  Commonly known as:  RISPERDAL  Take 2 tablets (1 mg total) by mouth at bedtime.     simvastatin 20 MG tablet  Commonly known as:  ZOCOR  Take 1 tablet (20 mg total) by mouth every evening.     tamsulosin 0.4 MG Caps capsule  Commonly known as:  FLOMAX  Take 2 capsules (0.8 mg total) by mouth at bedtime.           Follow-up Information    Follow up with GATES,ROBERT NEVILL, MD. Schedule an appointment as soon as possible for a visit in 2 weeks.   Specialty:  Internal Medicine   Why:  Follow up appt after recent hospitalization   Contact information:   [address] [place] [address] [phone]        The results of significant diagnostics from this hospitalization (including imaging, microbiology, ancillary and laboratory) are listed below for reference.    Significant Diagnostic Studies: Dg Chest 2 View  07/04/2014   CLINICAL DATA:  Acute encephalopathy.  Weakness.  EXAM: CHEST  2 VIEW  COMPARISON:  08/26/2013  FINDINGS: Mild enlargement of the heart is unchanged. 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 Lung markings are mildly prominent  but unchanged. No focal airspace disease. No significant pulmonary edema. Trachea is midline. Mild blunting at the right costophrenic angle appears chronic. Degenerative changes in the thoracic spine without large pleural effusions. Small retrocardiac density is compatible with a hiatal hernia.a  IMPRESSION: No acute chest findings.   Electronically Signed   By: Richarda Overlie M.D.   On: 07/04/2014  15:48   Ct Head Wo Contrast  07/03/2014   CLINICAL DATA:  Altered mental status  EXAM: CT HEAD WITHOUT CONTRAST  TECHNIQUE: Contiguous axial images were obtained from the base of the skull through the vertex without intravenous contrast.  COMPARISON:  CT head 05/30/2013  FINDINGS: Age-appropriate atrophy.  Negative for hydrocephalus  Mild chronic microvascular ischemic changes in the white matter. Negative for acute infarct. Negative for hemorrhage or mass  Prior mastoidectomy on the right. Left mastoid sinus effusion and middle ear effusion no acute bony abnormality.  IMPRESSION: Mild atrophy and mild chronic microvascular ischemic change in the white matter. No acute abnormality.  Left middle ear and mastoid sinus effusion.   Electronically Signed   By: Marlan Palau M.D.   On: 07/03/2014 13:38   Mr Brain Wo Contrast  07/04/2014   CLINICAL DATA:  Altered mental status and agitation. Hallucinations.  EXAM: MRI HEAD WITHOUT CONTRAST  TECHNIQUE: Multiplanar, multiecho pulse sequences of the brain and surrounding structures were obtained without intravenous contrast.  COMPARISON:  CT head without contrast 07/03/2014.  FINDINGS: The study is mildly degraded by patient motion. No acute infarct, hemorrhage, or mass lesion is present. Mild generalized atrophy and periventricular white matter changes are noted bilaterally. There are remote lacunar infarcts involving the right caudate head and anterior limb internal capsule. The ventricles are proportionate to the degree of atrophy. Flow is present in the major intracranial arteries. Midline images demonstrate degenerative changes in the upper cervical spine.  Paranasal sinuses are clear. Minimal fluid is present in the inferior right mastoid air cells. No obstructing nasopharyngeal lesion is evident. There is fluid in the left mastoid air cells and middle ear cavity. No obstructing nasopharyngeal lesion is evident.  IMPRESSION: 1. Mild atrophy and white matter  disease likely reflects the sequela of chronic microvascular ischemia. 2. Remote lacunar infarcts of the right basal ganglia. 3. No acute intracranial abnormality. 4. Degenerative changes of the upper cervical spine. 5. Left greater right mastoid effusions. No obstructing nasopharyngeal lesion is evident.   Electronically Signed   By: Gennette Pac M.D.   On: 07/04/2014 19:07    Microbiology: Recent Results (from the past 240 hour(s))  Urine culture     Status: None   Collection Time: 07/03/14  9:54 AM  Result Value Ref Range Status   Specimen Description URINE, CATHETERIZED  Final   Special Requests Normal  Final   Culture  Setup Time   Final    07/03/2014 15:30 Performed at [facility] Count   Final    >=100,000 COLONIES/ML Performed at [facility]    Culture   Final    ENTEROBACTER AEROGENES Performed at [facility]    Report Status 07/05/2014 FINAL  Final   Organism ID, Bacteria ENTEROBACTER AEROGENES  Final      Susceptibility   Enterobacter aerogenes - MIC*    CEFAZOLIN >=64 RESISTANT Resistant     CEFTRIAXONE <=1 SENSITIVE Sensitive     CIPROFLOXACIN <=0.25 SENSITIVE Sensitive     GENTAMICIN <=1 SENSITIVE Sensitive     LEVOFLOXACIN <=0.12 SENSITIVE Sensitive  NITROFURANTOIN <=16 SENSITIVE Sensitive     TOBRAMYCIN <=1 SENSITIVE Sensitive     TRIMETH/SULFA <=20 SENSITIVE Sensitive     PIP/TAZO <=4 SENSITIVE Sensitive     * ENTEROBACTER AEROGENES  MRSA PCR Screening     Status: None   Collection Time: 07/05/14 12:07 PM  Result Value Ref Range Status   MRSA by PCR NEGATIVE NEGATIVE Final    Comment:        The GeneXpert MRSA Assay (FDA approved for NASAL specimens only), is one component of a comprehensive MRSA colonization surveillance program. It is not intended to diagnose MRSA infection nor to guide or monitor treatment for MRSA infections.      Labs: Basic Metabolic Panel:  Recent Labs Lab 07/04/14 1820  07/05/14 0832 07/06/14 0801 07/07/14 0655 07/08/14 0523  NA 145 143 143 146 148*  K 4.6 4.3 3.9 3.9 3.8  CL 101 100 99 100 106  CO2 29 24 23 27 26   GLUCOSE 109* 125* 155* 165* 151*  BUN 14 19 28* 37* 42*  CREATININE 1.26 1.30 1.34 1.47* 1.25  CALCIUM 9.9 9.5 9.2 9.5 9.3   Liver Function Tests:  Recent Labs Lab 07/03/14 1021 07/04/14 1820  AST 11 11  ALT 5 6  ALKPHOS 77 92  BILITOT 0.3 0.8  PROT 6.3 7.3  ALBUMIN 3.0* 3.5   No results for input(s): LIPASE, AMYLASE in the last 168 hours.  Recent Labs Lab 07/03/14 1021  AMMONIA 21   CBC:  Recent Labs Lab 07/03/14 1021 07/04/14 1820 07/05/14 0832 07/06/14 0801 07/07/14 0655 07/08/14 0523  WBC 8.7 7.9 10.2 10.7* 11.6* 11.2*  NEUTROABS 4.5  --   --   --   --   --   HGB 12.2* 14.5 14.5 14.4 13.8 13.9  HCT 40.3 46.4 46.5 45.9 45.5 45.7  MCV 83.8 83.9 82.6 82.3 84.1 85.7  PLT 211 233 234 263 252 247   Cardiac Enzymes: No results for input(s): CKTOTAL, CKMB, CKMBINDEX, TROPONINI in the last 168 hours. BNP: BNP (last 3 results)  Recent Labs  08/26/13 1130  PROBNP 918.5*   CBG:  Recent Labs Lab 07/07/14 0746 07/07/14 1126 07/07/14 1632 07/07/14 2131 07/08/14 0730  GLUCAP 154* 156* 135* 139* 136*    Time coordinating discharge: Over 30 minutes

## 2014-07-08 NOTE — Progress Notes (Signed)
Pt left with PTAR to

## 2014-07-14 ENCOUNTER — Encounter (HOSPITAL_COMMUNITY): Payer: Self-pay | Admitting: Surgery

## 2014-07-14 ENCOUNTER — Non-Acute Institutional Stay (SKILLED_NURSING_FACILITY): Payer: Medicare Other | Admitting: Internal Medicine

## 2014-07-14 DIAGNOSIS — L89154 Pressure ulcer of sacral region, stage 4: Secondary | ICD-10-CM

## 2014-07-14 DIAGNOSIS — E1121 Type 2 diabetes mellitus with diabetic nephropathy: Secondary | ICD-10-CM

## 2014-07-14 DIAGNOSIS — I5032 Chronic diastolic (congestive) heart failure: Secondary | ICD-10-CM

## 2014-07-14 DIAGNOSIS — N390 Urinary tract infection, site not specified: Secondary | ICD-10-CM

## 2014-07-14 DIAGNOSIS — G9340 Encephalopathy, unspecified: Secondary | ICD-10-CM

## 2014-07-14 DIAGNOSIS — I2510 Atherosclerotic heart disease of native coronary artery without angina pectoris: Secondary | ICD-10-CM

## 2014-07-14 DIAGNOSIS — I119 Hypertensive heart disease without heart failure: Secondary | ICD-10-CM

## 2014-07-14 DIAGNOSIS — N179 Acute kidney failure, unspecified: Secondary | ICD-10-CM

## 2014-07-14 NOTE — Progress Notes (Signed)
MRN: [id number] Name: Sherilyn CooterJames D Deol  Sex: male Age: 73 y.o. DOB: Apr 10, 1941  [id number] Facility/Room:116 Level Of Care: SNF Provider: Merrilee SeashoreALEXANDER, ANNE D Emergency Contacts: Extended Emergency Contact Information Primary Emergency Contact: Zonia KiefOxendine,Audrey F Address: [address]          [place], [place] [id number] [place] Home Phone: [phone] Mobile Phone: [phone] Relation: Spouse  Code Status: DNR  Allergies: Contrast media  Chief Complaint  Patient presents with  . New Admit To SNF    HPI: Patient is 73 y.o. male who was admitted to hospital for urosepsis thAT presented with psychosis who is now admitted to SNF upon d/c from hospital.  Past Medical History  Diagnosis Date  . Hypertension   . Peripheral vascular disease   . Ulcer     left foot  . Dyslipidemia   . Chronic venous insufficiency   . Sleep apnea   . Morbid obesity   . Diverticulosis   . Benign prostatic hypertrophy   . Glaucoma   . Gout   . Depression   . Insomnia   . CAD (coronary artery disease), native coronary artery   . GERD (gastroesophageal reflux disease)   . Angina   . Pneumonia 12/11/11  . Type II diabetes mellitus   . Chronic daily headache     "~ all the time"  . Seizures   . Stroke ~ 1983    "little weaker right hand"  . MRSA (methicillin resistant staph aureus) culture positive Dec. 17, 2013     right heel infection    Past Surgical History  Procedure Laterality Date  . Appendectomy    . Lumbar laminectomy    . Mastoid debridement    . Knee and ankle surgery  1979    "caved in in a ditch; messed up legs"; right  . Cholecystectomy    . Back surgery      "5 or 6"  . Peripheral arterial stent graft      "don't remember which side"  . Coronary artery bypass graft  1983; 1992    "CABG X 5; CABG X 8"  . Lower extremity angiogram  01/21/2012    Procedure: LOWER EXTREMITY ANGIOGRAM;  Surgeon: Nada LibmanVance W Brabham, MD;  [facility];  Service:  Vascular;  Laterality: Bilateral;  WITH POSSIBLE INTERVENTION  . Eye surgery      "laser; both eyes"  . Eye surgery  05/08/2012    Cataract Left eye  . Heel bx  Dec. 17, 2013    Right Heel Bx.    . Transluminal atherectomy popliteal artery  01/21/2012    Left  . Amputation Left 08/27/2013    Procedure: LEFT ABOVE THE KNEE AMPUTATION ;  Surgeon: Pryor OchoaJames D Lawson, MD;  [facility];  Service: Vascular;  Laterality: Left;  . Abdominal aortagram N/A 06/02/2012    Procedure: ABDOMINAL AORTAGRAM;  Surgeon: Nada LibmanVance W Brabham, MD;  [facility];  Service: Cardiovascular;  Laterality: N/A;      Medication List       This list is accurate as of: 07/14/14 11:59 PM.  Always use your most recent med list.               allopurinol 300 MG tablet  Commonly known as:  ZYLOPRIM  Take 1 tablet (300 mg total) by mouth every morning.     alum & mag hydroxide-simeth 200-200-20 MG/5ML suspension  Commonly known as:  MAALOX/MYLANTA  Take 30 mLs by  mouth as needed for indigestion or heartburn.     aspirin 81 MG EC tablet  Take 1 tablet (81 mg total) by mouth daily.     atenolol 50 MG tablet  Commonly known as:  TENORMIN  Take 1 tablet (50 mg total) by mouth 2 (two) times daily.     brimonidine-timolol 0.2-0.5 % ophthalmic solution  Commonly known as:  COMBIGAN  Place 1 drop into both eyes every 12 (twelve) hours.     clopidogrel 75 MG tablet  Commonly known as:  PLAVIX  Take 1 tablet (75 mg total) by mouth every morning.     diclofenac sodium 1 % Gel  Commonly known as:  VOLTAREN  Apply 1 application topically 4 (four) times daily as needed. Applies to both knees as needed for pain     feeding supplement (GLUCERNA SHAKE) Liqd  Take 237 mLs by mouth 2 (two) times daily between meals.     feeding supplement (PRO-STAT SUGAR FREE 64) Liqd  Take 30 mLs by mouth 2 (two) times daily with a meal.     furosemide 40 MG tablet  Commonly known as:  LASIX  Take 1 tablet (40 mg total) by  mouth every morning.     HYDROcodone-acetaminophen 10-325 MG per tablet  Commonly known as:  NORCO  Take 1 tablet by mouth 3 (three) times daily.     HYDROcodone-acetaminophen 10-325 MG per tablet  Commonly known as:  NORCO  Take one tablet by mouth three times daily for pain     insulin aspart 100 UNIT/ML injection  Commonly known as:  novoLOG  Inject 0-9 Units into the skin 3 (three) times daily with meals.     isosorbide mononitrate 30 MG 24 hr tablet  Commonly known as:  IMDUR  Take 1 tablet (30 mg total) by mouth every morning.     latanoprost 0.005 % ophthalmic solution  Commonly known as:  XALATAN  Place 1 drop into both eyes at bedtime.     magnesium oxide 400 MG tablet  Commonly known as:  MAG-OX  Take 400 mg by mouth daily with breakfast.     mirtazapine 15 MG tablet  Commonly known as:  REMERON  Take 1 tablet (15 mg total) by mouth at bedtime.     multivitamin with minerals Tabs tablet  Take 1 tablet by mouth daily.     nitrofurantoin (macrocrystal-monohydrate) 100 MG capsule  Commonly known as:  MACROBID  Take 1 capsule (100 mg total) by mouth 2 (two) times daily.     nitroGLYCERIN 0.4 MG SL tablet  Commonly known as:  NITROSTAT  Place 1 tablet (0.4 mg total) under the tongue every 5 (five) minutes as needed. For chest pain     ondansetron 4 MG tablet  Commonly known as:  ZOFRAN  Take 1 tablet (4 mg total) by mouth every 6 (six) hours as needed for nausea.     pantoprazole 40 MG tablet  Commonly known as:  PROTONIX  Take 1 tablet (40 mg total) by mouth daily.     perphenazine 8 MG tablet  Commonly known as:  TRILAFON  Take 16 mg by mouth at bedtime.     potassium chloride SA 20 MEQ tablet  Commonly known as:  K-DUR,KLOR-CON  Take 1 tablet (20 mEq total) by mouth daily.     risperiDONE 0.5 MG tablet  Commonly known as:  RISPERDAL  Take 2 tablets (1 mg total) by mouth at bedtime.     simvastatin  20 MG tablet  Commonly known as:  ZOCOR  Take 1  tablet (20 mg total) by mouth every evening.     tamsulosin 0.4 MG Caps capsule  Commonly known as:  FLOMAX  Take 2 capsules (0.8 mg total) by mouth at bedtime.        No orders of the defined types were placed in this encounter.    Immunization History  Administered Date(s) Administered  . Pneumococcal-Unspecified 04/06/2011    History  Substance Use Topics  . Smoking status: Former Smoker -- 1.00 packs/day for 20 years    Types: Cigarettes    Quit date: 07/29/1991  . Smokeless tobacco: Former Neurosurgeon    Types: Chew    Quit date: 08/06/1987  . Alcohol Use: No     Comment: occasionallly    Family history is noncontributory    Review of Systems   UTO form pt 2/2 MS;nursing without issues currently     Filed Vitals:   07/14/14 0922  BP: 129/67  Pulse: 78  Temp: 97.4 F (36.3 C)  Resp: 20    Physical Exam  GENERAL APPEARANCE: Alert, modconversant but makes no sense,  No acute distress.  SKIN: No diaphoresis rash HEAD: Normocephalic, atraumatic  EYES: Conjunctiva/lids clear. Pupils round, reactive. EOMs intact.  EARS: External exam WNL, canals clear. Hearing grossly normal.  NOSE: No deformity or discharge.  MOUTH/THROAT: Lips w/o lesions  RESPIRATORY: Breathing is even, unlabored. Lung sounds are clear   CARDIOVASCULAR: Heart RRR no murmurs, rubs or gallops. No peripheral edema.   GASTROINTESTINAL: Abdomen is soft, non-tender, not distended w/ normal bowel sounds. GENITOURINARY: Bladder non tender, not distended  MUSCULOSKELETAL: L AKA NEUROLOGIC:  Cranial nerves 2-12 grossly intact. PSYCHIATRIC: confusion, no behavioral issues  Patient Active Problem List   Diagnosis Date Noted  . ARF (acute renal failure) 07/19/2014  . Altered mental status   . UTI (lower urinary tract infection) 07/04/2014  . Encephalopathy acute 07/04/2014  . Hallucination   . Degenerative arthritis of right knee 03/21/2014  . PVD (peripheral vascular disease) 09/28/2013  . S/P AKA  (above knee amputation) 08/30/2013  . Sacral decubitus ulcer, stage IV 08/27/2013  . MRSA (methicillin resistant Staphylococcus aureus) infection 08/26/2013  . History of stroke   . Cellulitis of foot 08/24/2013  . Type 2 diabetes mellitus with diabetic nephropathy 06/03/2013  . Pressure ulcer stage II 06/03/2013  . Chronic respiratory failure 05/31/2013  . Chronic diastolic heart failure   . 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 DNR (do not resuscitate) 12/11/2011  . Morbid obesity   . CAD (coronary artery disease), native coronary artery   . Diabetic foot ulcer 08/28/2011  . PAD (peripheral artery disease) 07/29/2011  . Anxiety 07/29/2011  . BPH (benign prostatic hyperplasia) 07/29/2011  . Type 2 diabetes mellitus with vascular disease   . Hypertensive heart disease without CHF   . Hyperlipidemia     CBC    Component Value Date/Time   WBC 11.2* 07/08/2014 0523   RBC 5.33 07/08/2014 0523   HGB 13.9 07/08/2014 0523   HCT 45.7 07/08/2014 0523   PLT 247 07/08/2014 0523   MCV 85.7 07/08/2014 0523   LYMPHSABS 3.4 07/03/2014 1021   MONOABS 0.4 07/03/2014 1021   EOSABS 0.4 07/03/2014 1021   BASOSABS 0.0 07/03/2014 1021    CMP     Component Value Date/Time   NA 148* 07/08/2014 0523   K 3.8 07/08/2014 0523   CL 106 07/08/2014  0523   CO2 26 07/08/2014 0523   GLUCOSE 151* 07/08/2014 0523   BUN 42* 07/08/2014 0523   CREATININE 1.25 07/08/2014 0523   CALCIUM 9.3 07/08/2014 0523   PROT 7.3 07/04/2014 1820   ALBUMIN 3.5 07/04/2014 1820   AST 11 07/04/2014 1820   ALT 6 07/04/2014 1820   ALKPHOS 92 07/04/2014 1820   BILITOT 0.8 07/04/2014 1820   GFRNONAA 56* 07/08/2014 0523   GFRAA 65* 07/08/2014 0523    Assessment and Plan  Encephalopathy acute Hallucination, Psychosis;  - This could be secondary to infection process, vs psychiatry disorder - TSH 0.897, B-12: 1537, RPR negative.  - MRI showed old stroke, and EEG  negative for seizure  - psych consulted. 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 Based on sensitivity report we can also give Macrobid which we will prescribe on discharge for 2 more days  Chronic diastolic heart failure continue with lasix but with lower dose, 40 mg daily  Sacral decubitus ulcer, stage IV Continue wound care   Pt seen 07/11/2014. Margit HanksALEXANDER, ANNE D, MD

## 2014-07-18 ENCOUNTER — Other Ambulatory Visit: Payer: Self-pay | Admitting: *Deleted

## 2014-07-18 MED ORDER — HYDROCODONE-ACETAMINOPHEN 10-325 MG PO TABS
ORAL_TABLET | ORAL | Status: DC
Start: 2014-07-18 — End: 2014-07-25

## 2014-07-18 NOTE — Telephone Encounter (Signed)
[facility] 

## 2014-07-19 ENCOUNTER — Encounter: Payer: Self-pay | Admitting: Internal Medicine

## 2014-07-19 DIAGNOSIS — N179 Acute kidney failure, unspecified: Secondary | ICD-10-CM | POA: Insufficient documentation

## 2014-07-19 NOTE — Assessment & Plan Note (Signed)
Patient was on Rocephin. Based on sensitivity report we can also give Macrobid which we will prescribe on discharge for 2 more days

## 2014-07-19 NOTE — Assessment & Plan Note (Signed)
secondary to Lasix - The dose was lowered from 80 mg PO QD --> 40 mg PO QD (12/03) - Patient can continue Lasix 40 mg daily

## 2014-07-19 NOTE — Assessment & Plan Note (Signed)
Hallucination, Psychosis;  - This could be secondary to infection process, vs psychiatry disorder - TSH 0.897, B-12: 1537, RPR negative.  - MRI showed old stroke, and EEG negative for seizure  - psych consulted. - On Remeron at bedtime; will stop temazepam to avoid duplications

## 2014-07-19 NOTE — Assessment & Plan Note (Signed)
Continue with lasix, plavix, atenolol, imdur, aspirin  

## 2014-07-19 NOTE — Assessment & Plan Note (Signed)
Continue wound care. ?

## 2014-07-19 NOTE — Assessment & Plan Note (Signed)
uncontrolled with complications of neuropathy - with intermittent hypoglycemia  - A1C 6.1 - Patient can continue taking sliding scale insulin only

## 2014-07-19 NOTE — Assessment & Plan Note (Signed)
continue with lasix but with lower dose, 40 mg daily

## 2014-07-19 NOTE — Assessment & Plan Note (Signed)
Continue with lasix, plavix, atenolol, imdur, aspirin

## 2014-07-20 ENCOUNTER — Emergency Department (HOSPITAL_COMMUNITY): Payer: Medicare Other

## 2014-07-20 ENCOUNTER — Inpatient Hospital Stay (HOSPITAL_COMMUNITY)
Admission: EM | Admit: 2014-07-20 | Discharge: 2014-07-25 | DRG: 377 | Disposition: A | Discharge status: Skilled Nursing Facility | Payer: Medicare Other | Attending: Internal Medicine | Admitting: Internal Medicine

## 2014-07-20 ENCOUNTER — Encounter (HOSPITAL_COMMUNITY): Payer: Self-pay | Admitting: Adult Health

## 2014-07-20 DIAGNOSIS — Z809 Family history of malignant neoplasm, unspecified: Secondary | ICD-10-CM | POA: Diagnosis not present

## 2014-07-20 DIAGNOSIS — K219 Gastro-esophageal reflux disease without esophagitis: Secondary | ICD-10-CM | POA: Diagnosis present

## 2014-07-20 DIAGNOSIS — D62 Acute posthemorrhagic anemia: Secondary | ICD-10-CM | POA: Diagnosis present

## 2014-07-20 DIAGNOSIS — Z8249 Family history of ischemic heart disease and other diseases of the circulatory system: Secondary | ICD-10-CM | POA: Diagnosis not present

## 2014-07-20 DIAGNOSIS — I2510 Atherosclerotic heart disease of native coronary artery without angina pectoris: Secondary | ICD-10-CM | POA: Diagnosis present

## 2014-07-20 DIAGNOSIS — E1165 Type 2 diabetes mellitus with hyperglycemia: Secondary | ICD-10-CM | POA: Diagnosis present

## 2014-07-20 DIAGNOSIS — Z7902 Long term (current) use of antithrombotics/antiplatelets: Secondary | ICD-10-CM | POA: Diagnosis not present

## 2014-07-20 DIAGNOSIS — L89154 Pressure ulcer of sacral region, stage 4: Secondary | ICD-10-CM | POA: Diagnosis present

## 2014-07-20 DIAGNOSIS — E1151 Type 2 diabetes mellitus with diabetic peripheral angiopathy without gangrene: Secondary | ICD-10-CM | POA: Diagnosis present

## 2014-07-20 DIAGNOSIS — K254 Chronic or unspecified gastric ulcer with hemorrhage: Principal | ICD-10-CM | POA: Diagnosis present

## 2014-07-20 DIAGNOSIS — Z7982 Long term (current) use of aspirin: Secondary | ICD-10-CM

## 2014-07-20 DIAGNOSIS — I872 Venous insufficiency (chronic) (peripheral): Secondary | ICD-10-CM | POA: Diagnosis present

## 2014-07-20 DIAGNOSIS — K920 Hematemesis: Secondary | ICD-10-CM | POA: Diagnosis present

## 2014-07-20 DIAGNOSIS — I5032 Chronic diastolic (congestive) heart failure: Secondary | ICD-10-CM | POA: Diagnosis present

## 2014-07-20 DIAGNOSIS — I10 Essential (primary) hypertension: Secondary | ICD-10-CM | POA: Diagnosis present

## 2014-07-20 DIAGNOSIS — Z8673 Personal history of transient ischemic attack (TIA), and cerebral infarction without residual deficits: Secondary | ICD-10-CM | POA: Diagnosis not present

## 2014-07-20 DIAGNOSIS — G4730 Sleep apnea, unspecified: Secondary | ICD-10-CM | POA: Diagnosis present

## 2014-07-20 DIAGNOSIS — Z87891 Personal history of nicotine dependence: Secondary | ICD-10-CM

## 2014-07-20 DIAGNOSIS — Z6832 Body mass index (BMI) 32.0-32.9, adult: Secondary | ICD-10-CM | POA: Diagnosis not present

## 2014-07-20 DIAGNOSIS — Z951 Presence of aortocoronary bypass graft: Secondary | ICD-10-CM

## 2014-07-20 DIAGNOSIS — H409 Unspecified glaucoma: Secondary | ICD-10-CM | POA: Diagnosis present

## 2014-07-20 DIAGNOSIS — F418 Other specified anxiety disorders: Secondary | ICD-10-CM | POA: Diagnosis present

## 2014-07-20 DIAGNOSIS — Z91041 Radiographic dye allergy status: Secondary | ICD-10-CM | POA: Diagnosis not present

## 2014-07-20 DIAGNOSIS — E785 Hyperlipidemia, unspecified: Secondary | ICD-10-CM | POA: Diagnosis present

## 2014-07-20 DIAGNOSIS — Z89612 Acquired absence of left leg above knee: Secondary | ICD-10-CM | POA: Diagnosis not present

## 2014-07-20 DIAGNOSIS — E119 Type 2 diabetes mellitus without complications: Secondary | ICD-10-CM

## 2014-07-20 DIAGNOSIS — M109 Gout, unspecified: Secondary | ICD-10-CM | POA: Diagnosis present

## 2014-07-20 DIAGNOSIS — I739 Peripheral vascular disease, unspecified: Secondary | ICD-10-CM | POA: Diagnosis present

## 2014-07-20 DIAGNOSIS — Z794 Long term (current) use of insulin: Secondary | ICD-10-CM | POA: Diagnosis not present

## 2014-07-20 DIAGNOSIS — N179 Acute kidney failure, unspecified: Secondary | ICD-10-CM | POA: Diagnosis present

## 2014-07-20 DIAGNOSIS — Z66 Do not resuscitate: Secondary | ICD-10-CM | POA: Diagnosis present

## 2014-07-20 DIAGNOSIS — K922 Gastrointestinal hemorrhage, unspecified: Secondary | ICD-10-CM | POA: Diagnosis present

## 2014-07-20 DIAGNOSIS — F0150 Vascular dementia without behavioral disturbance: Secondary | ICD-10-CM | POA: Diagnosis present

## 2014-07-20 LAB — COMPREHENSIVE METABOLIC PANEL
ALBUMIN: 2.4 g/dL — AB (ref 3.5–5.2)
ALT: 17 U/L (ref 0–53)
ANION GAP: 16 — AB (ref 5–15)
AST: 15 U/L (ref 0–37)
Alkaline Phosphatase: 66 U/L (ref 39–117)
BUN: 110 mg/dL — AB (ref 6–23)
CO2: 18 mEq/L — ABNORMAL LOW (ref 19–32)
CREATININE: 1.21 mg/dL (ref 0.50–1.35)
Calcium: 8.7 mg/dL (ref 8.4–10.5)
Chloride: 101 mEq/L (ref 96–112)
GFR calc Af Amer: 67 mL/min — ABNORMAL LOW (ref >90)
GFR calc non Af Amer: 58 mL/min — ABNORMAL LOW (ref >90)
Glucose, Bld: 246 mg/dL — ABNORMAL HIGH (ref 70–99)
Potassium: 4.1 mEq/L (ref 3.7–5.3)
Sodium: 135 mEq/L — ABNORMAL LOW (ref 137–147)
TOTAL PROTEIN: 5.8 g/dL — AB (ref 6.0–8.3)
Total Bilirubin: 0.2 mg/dL — ABNORMAL LOW (ref 0.3–1.2)

## 2014-07-20 LAB — I-STAT CHEM 8, ED
BUN: 104 mg/dL — AB (ref 6–23)
CHLORIDE: 107 meq/L (ref 96–112)
Calcium, Ion: 1.12 mmol/L — ABNORMAL LOW (ref 1.13–1.30)
Creatinine, Ser: 1.3 mg/dL (ref 0.50–1.35)
Glucose, Bld: 250 mg/dL — ABNORMAL HIGH (ref 70–99)
HEMATOCRIT: 33 % — AB (ref 39.0–52.0)
Hemoglobin: 11.2 g/dL — ABNORMAL LOW (ref 13.0–17.0)
Potassium: 3.9 mEq/L (ref 3.7–5.3)
SODIUM: 138 meq/L (ref 137–147)
TCO2: 17 mmol/L (ref 0–100)

## 2014-07-20 LAB — PROTIME-INR
INR: 1.21 (ref 0.00–1.49)
Prothrombin Time: 15.4 seconds — ABNORMAL HIGH (ref 11.6–15.2)

## 2014-07-20 LAB — CBC
HEMATOCRIT: 32.5 % — AB (ref 39.0–52.0)
Hemoglobin: 10.3 g/dL — ABNORMAL LOW (ref 13.0–17.0)
MCH: 26.7 pg (ref 26.0–34.0)
MCHC: 31.7 g/dL (ref 30.0–36.0)
MCV: 84.2 fL (ref 78.0–100.0)
PLATELETS: 335 10*3/uL (ref 150–400)
RBC: 3.86 MIL/uL — ABNORMAL LOW (ref 4.22–5.81)
RDW: 17 % — ABNORMAL HIGH (ref 11.5–15.5)
WBC: 15.3 10*3/uL — ABNORMAL HIGH (ref 4.0–10.5)

## 2014-07-20 LAB — I-STAT CG4 LACTIC ACID, ED: Lactic Acid, Venous: 1.8 mmol/L (ref 0.5–2.2)

## 2014-07-20 LAB — ABO/RH: ABO/RH(D): O POS

## 2014-07-20 MED ORDER — FENTANYL CITRATE 0.05 MG/ML IJ SOLN
50.0000 ug | Freq: Once | INTRAMUSCULAR | Status: AC
  Administered 2014-07-20: 50 ug via INTRAVENOUS
  Filled 2014-07-20: qty 2

## 2014-07-20 MED ORDER — PANTOPRAZOLE SODIUM 40 MG IV SOLR
40.0000 mg | Freq: Once | INTRAVENOUS | Status: AC
  Administered 2014-07-20: 40 mg via INTRAVENOUS
  Filled 2014-07-20: qty 40

## 2014-07-20 NOTE — ED Provider Notes (Signed)
CSN: [id number]     Arrival date & time 07/20/14  2135 History   First MD Initiated Contact with Patient 07/20/14 2136     Chief Complaint  Patient presents with  . Hematemesis     (Consider location/radiation/quality/duration/timing/severity/associated sxs/prior Treatment) Patient is a 73 y.o. male presenting with vomiting.  Emesis Severity:  Mild Timing:  Rare Number of daily episodes:  1 Quality:  Bright red blood and stomach contents Chronicity:  New Worsened by:  Nothing tried Associated symptoms: abdominal pain   Associated symptoms: no chills, no cough, no diarrhea, no fever and no headaches     Past Medical History  Diagnosis Date  . Hypertension   . Peripheral vascular disease   . Ulcer     left foot  . Dyslipidemia   . Chronic venous insufficiency   . Sleep apnea   . Morbid obesity   . Diverticulosis   . Benign prostatic hypertrophy   . Glaucoma   . Gout   . Depression   . Insomnia   . CAD (coronary artery disease), native coronary artery   . GERD (gastroesophageal reflux disease)   . Angina   . Pneumonia 12/11/11  . Type II diabetes mellitus   . Chronic daily headache     "~ all the time"  . Seizures   . Stroke ~ 1983    "little weaker right hand"  . MRSA (methicillin resistant staph aureus) culture positive Dec. 17, 2013     right heel infection   Past Surgical History  Procedure Laterality Date  . Appendectomy    . Lumbar laminectomy    . Mastoid debridement    . Knee and ankle surgery  1979    "caved in in a ditch; messed up legs"; right  . Cholecystectomy    . Back surgery      "5 or 6"  . Peripheral arterial stent graft      "don't remember which side"  . Coronary artery bypass graft  1983; 1992    "CABG X 5; CABG X 8"  . Lower extremity angiogram  01/21/2012    Procedure: LOWER EXTREMITY ANGIOGRAM;  Surgeon: Nada LibmanVance W Brabham, MD;  [facility];  Service: Vascular;  Laterality: Bilateral;  WITH POSSIBLE INTERVENTION  . Eye surgery       "laser; both eyes"  . Eye surgery  05/08/2012    Cataract Left eye  . Heel bx  Dec. 17, 2013    Right Heel Bx.    . Transluminal atherectomy popliteal artery  01/21/2012    Left  . Amputation Left 08/27/2013    Procedure: LEFT ABOVE THE KNEE AMPUTATION ;  Surgeon: Pryor OchoaJames D Lawson, MD;  [facility];  Service: Vascular;  Laterality: Left;  . Abdominal aortagram N/A 06/02/2012    Procedure: ABDOMINAL AORTAGRAM;  Surgeon: Nada LibmanVance W Brabham, MD;  [facility];  Service: Cardiovascular;  Laterality: N/A;   Family History  Problem Relation Age of Onset  . Hypertension Other   . Cancer Other   . Cancer Sister   . Deep vein thrombosis Brother   . Heart disease Brother   . Deep vein thrombosis Brother   . Heart disease Brother   . Cancer Sister    History  Substance Use Topics  . Smoking status: Former Smoker -- 1.00 packs/day for 20 years    Types: Cigarettes    Quit date: 07/29/1991  . Smokeless tobacco: Former NeurosurgeonUser    Types: Sports administratorChew  Quit date: 08/06/1987  . Alcohol Use: No     Comment: occasionallly    Review of Systems  Constitutional: Negative for fever and chills.  HENT: Negative for congestion and tinnitus.   Eyes: Negative for photophobia.  Respiratory: Negative for cough and shortness of breath.   Gastrointestinal: Positive for vomiting and abdominal pain. Negative for diarrhea.  Genitourinary: Negative for hematuria.  Musculoskeletal: Positive for back pain. Negative for neck pain.  Skin: Negative for wound.  Neurological: Negative for headaches.  All other systems reviewed and are negative.     Allergies  Contrast media  Home Medications   Prior to Admission medications   Medication Sig Start Date End Date Taking? Authorizing Provider  allopurinol (ZYLOPRIM) 300 MG tablet Take 1 tablet (300 mg total) by mouth every morning. 09/08/13  Yes Daniel J Angiulli, PA-C  Amino Acids-Protein Hydrolys (FEEDING SUPPLEMENT, PRO-STAT SUGAR FREE 64,) LIQD Take 30  mLs by mouth 2 (two) times daily with a meal. 07/08/14  Yes Alison Murray, MD  aspirin 81 MG EC tablet Take 1 tablet (81 mg total) by mouth daily. Patient taking differently: Take 81 mg by mouth daily with breakfast.  09/08/13  Yes Daniel J Angiulli, PA-C  atenolol (TENORMIN) 50 MG tablet Take 1 tablet (50 mg total) by mouth 2 (two) times daily. 09/08/13  Yes Daniel J Angiulli, PA-C  bisacodyl (DULCOLAX) 10 MG suppository Place 10 mg rectally as needed for moderate constipation (constipation not relieved by Milk of Magnesium).   Yes Historical Provider, MD  brimonidine-timolol (COMBIGAN) 0.2-0.5 % ophthalmic solution Place 1 drop into both eyes every 12 (twelve) hours.    Yes Historical Provider, MD  clopidogrel (PLAVIX) 75 MG tablet Take 1 tablet (75 mg total) by mouth every morning. 09/08/13  Yes Daniel J Angiulli, PA-C  furosemide (LASIX) 40 MG tablet Take 1 tablet (40 mg total) by mouth every morning. 07/08/14  Yes Alison Murray, MD  HYDROcodone-acetaminophen [facility]) 10-325 MG per tablet Take one tablet by mouth three times daily for pain 07/18/14  Yes Sharon Seller, NP  insulin aspart (NOVOLOG) 100 UNIT/ML injection Inject 0-9 Units into the skin 3 (three) times daily with meals. 07/08/14  Yes Alison Murray, MD  isosorbide mononitrate (IMDUR) 30 MG 24 hr tablet Take 1 tablet (30 mg total) by mouth every morning. 09/08/13  Yes Daniel J Angiulli, PA-C  latanoprost (XALATAN) 0.005 % ophthalmic solution Place 1 drop into both eyes at bedtime.     Yes Historical Provider, MD  magnesium hydroxide (MILK OF MAGNESIA) 400 MG/5ML suspension Take 30 mLs by mouth daily as needed for mild constipation.   Yes Historical Provider, MD  magnesium oxide (MAG-OX) 400 MG tablet Take 400 mg by mouth daily with breakfast.   Yes Historical Provider, MD  mirtazapine (REMERON) 15 MG tablet Take 1 tablet (15 mg total) by mouth at bedtime. 07/08/14  Yes Alison Murray, MD  Multiple Vitamin (MULTIVITAMIN WITH MINERALS) TABS tablet  Take 1 tablet by mouth daily. 07/08/14  Yes Alison Murray, MD  pantoprazole (PROTONIX) 40 MG tablet Take 1 tablet (40 mg total) by mouth daily. 07/08/14  Yes Alison Murray, MD  perphenazine (TRILAFON) 16 MG tablet Take 16 mg by mouth at bedtime.   Yes Historical Provider, MD  potassium chloride SA (K-DUR,KLOR-CON) 20 MEQ tablet Take 1 tablet (20 mEq total) by mouth daily. 07/08/14  Yes Alison Murray, MD  risperiDONE (RISPERDAL) 0.5 MG tablet Take 2 tablets (1 mg  total) by mouth at bedtime. 07/08/14  Yes Alison Murray, MD  simvastatin (ZOCOR) 20 MG tablet Take 1 tablet (20 mg total) by mouth every evening. 09/08/13  Yes Daniel J Angiulli, PA-C  sodium phosphate (FLEET) 7-19 GM/118ML ENEM Place 1 enema rectally once as needed for severe constipation (for constipation not relieved by milk of magnesium).   Yes Historical Provider, MD  tamsulosin (FLOMAX) 0.4 MG CAPS capsule Take 2 capsules (0.8 mg total) by mouth at bedtime. 09/08/13  Yes Daniel J Angiulli, PA-C  alum & mag hydroxide-simeth (MAALOX/MYLANTA) 200-200-20 MG/5ML suspension Take 30 mLs by mouth as needed for indigestion or heartburn. 07/08/14   Alison Murray, MD  diclofenac sodium (VOLTAREN) 1 % GEL Apply 1 application topically 4 (four) times daily as needed. Applies to both knees as needed for pain 09/08/13   Mcarthur Rossetti Angiulli, PA-C  feeding supplement, GLUCERNA SHAKE, (GLUCERNA SHAKE) LIQD Take 237 mLs by mouth 2 (two) times daily between meals. 07/08/14   Alison Murray, MD  nitrofurantoin, macrocrystal-monohydrate, (MACROBID) 100 MG capsule Take 1 capsule (100 mg total) by mouth 2 (two) times daily. Patient not taking: Reported on 07/20/2014 07/08/14   Alison Murray, MD  nitroGLYCERIN (NITROSTAT) 0.4 MG SL tablet Place 1 tablet (0.4 mg total) under the tongue every 5 (five) minutes as needed. For chest pain 09/08/13   Mcarthur Rossetti Angiulli, PA-C  ondansetron (ZOFRAN) 4 MG tablet Take 1 tablet (4 mg total) by mouth every 6 (six) hours as needed for nausea.  07/08/14   Alison Murray, MD   BP 112/55 mmHg  Pulse 89  Temp(Src) 98.4 F (36.9 C) (Oral)  Resp 22  SpO2 100% Physical Exam  Constitutional: He appears well-developed and well-nourished.  HENT:  Head: Normocephalic and atraumatic.  Eyes: Conjunctivae are normal. Pupils are equal, round, and reactive to light.  Pale conjunctiva  Neck: Normal range of motion.  Cardiovascular: Normal rate and regular rhythm.   Pulmonary/Chest: Effort normal.  Abdominal: There is tenderness.  Musculoskeletal: Normal range of motion. He exhibits no edema or tenderness.  Neurological: He is alert. No cranial nerve deficit. Coordination normal.  Oriented to person only  Nursing note and vitals reviewed.   ED Course  Procedures (including critical care time) Labs Review Labs Reviewed  PROTIME-INR - Abnormal; Notable for the following:    Prothrombin Time 15.4 (*)    All other components within normal limits  CBC - Abnormal; Notable for the following:    WBC 15.3 (*)    RBC 3.86 (*)    Hemoglobin 10.3 (*)    HCT 32.5 (*)    RDW 17.0 (*)    All other components within normal limits  COMPREHENSIVE METABOLIC PANEL - Abnormal; Notable for the following:    Sodium 135 (*)    CO2 18 (*)    Glucose, Bld 246 (*)    BUN 110 (*)    Total Protein 5.8 (*)    Albumin 2.4 (*)    Total Bilirubin 0.2 (*)    GFR calc non Af Amer 58 (*)    GFR calc Af Amer 67 (*)    Anion gap 16 (*)    All other components within normal limits  I-STAT CHEM 8, ED - Abnormal; Notable for the following:    BUN 104 (*)    Glucose, Bld 250 (*)    Calcium, Ion 1.12 (*)    Hemoglobin 11.2 (*)    HCT 33.0 (*)  All other components within normal limits  I-STAT CG4 LACTIC ACID, ED  POC OCCULT BLOOD, ED  TYPE AND SCREEN  ABO/RH    Imaging Review Dg Abd Acute W/chest  07/20/2014   CLINICAL DATA:  Hematemesis.  EXAM: ACUTE ABDOMEN SERIES (ABDOMEN 2 VIEW & CHEST 1 VIEW)  COMPARISON:  July 04, 2014.  FINDINGS: There is  no evidence of dilated bowel loops or free intraperitoneal air. No radiopaque calculi or other significant radiographic abnormality is seen. Heart size and mediastinal contours are within normal limits. Both lungs are clear.  IMPRESSION: No evidence of bowel obstruction or ileus. No acute cardiopulmonary disease.   Electronically Signed   By: Roque LiasJames  Green M.D.   On: 07/20/2014 22:47     EKG Interpretation None      MDM   Final diagnoses:  Hematemesis    73 year old male with multiple comorbidities here with hematemesis. No hematemesis and immersed department no NG tube place at this time. BUN was markedly elevated over his previous also with the 3 g/dL hemoglobin drop. GI consult it and will scope in the morning and requested inpatient admission to hospitalist service. IV Protonix given vital signs still stable we'll admit to medicine likely stepdown unit.    Marily MemosJason Trashawn Oquendo, MD 07/20/14 2349  Marily MemosJason Macklin Jacquin, MD 07/20/14 [id number]  Nelia Shiobert L Beaton, MD 07/24/14 [phone]

## 2014-07-20 NOTE — ED Notes (Signed)
Dr. Kakrakandy at bedside. 

## 2014-07-20 NOTE — ED Notes (Addendum)
[place] and altered mental at that time. Pale mucosa, alert and oriented, uses O2 at all times. C/o lower back pain and generalized abdominal pain. He  Has a DNR and takes plavix

## 2014-07-20 NOTE — ED Notes (Signed)
REPORT ATTEMPTED. 

## 2014-07-21 ENCOUNTER — Encounter (HOSPITAL_COMMUNITY): Payer: Self-pay | Admitting: Internal Medicine

## 2014-07-21 DIAGNOSIS — I5032 Chronic diastolic (congestive) heart failure: Secondary | ICD-10-CM

## 2014-07-21 DIAGNOSIS — K922 Gastrointestinal hemorrhage, unspecified: Secondary | ICD-10-CM | POA: Diagnosis present

## 2014-07-21 DIAGNOSIS — K254 Chronic or unspecified gastric ulcer with hemorrhage: Principal | ICD-10-CM

## 2014-07-21 DIAGNOSIS — R110 Nausea: Secondary | ICD-10-CM

## 2014-07-21 DIAGNOSIS — K920 Hematemesis: Secondary | ICD-10-CM

## 2014-07-21 DIAGNOSIS — K2971 Gastritis, unspecified, with bleeding: Secondary | ICD-10-CM

## 2014-07-21 DIAGNOSIS — E119 Type 2 diabetes mellitus without complications: Secondary | ICD-10-CM

## 2014-07-21 LAB — CBC
HCT: 30 % — ABNORMAL LOW (ref 39.0–52.0)
HCT: 29.2 % — ABNORMAL LOW (ref 39.0–52.0)
HEMATOCRIT: 26 % — AB (ref 39.0–52.0)
Hemoglobin: 9.6 g/dL — ABNORMAL LOW (ref 13.0–17.0)
Hemoglobin: 9.4 g/dL — ABNORMAL LOW (ref 13.0–17.0)
Hemoglobin: 8.3 g/dL — ABNORMAL LOW (ref 13.0–17.0)
MCH: 26.8 pg (ref 26.0–34.0)
MCH: 27.1 pg (ref 26.0–34.0)
MCH: 26.1 pg (ref 26.0–34.0)
MCHC: 32 g/dL (ref 30.0–36.0)
MCHC: 32.2 g/dL (ref 30.0–36.0)
MCHC: 31.9 g/dL (ref 30.0–36.0)
MCV: 83.8 fL (ref 78.0–100.0)
MCV: 84.1 fL (ref 78.0–100.0)
MCV: 81.8 fL (ref 78.0–100.0)
PLATELETS: 339 10*3/uL (ref 150–400)
PLATELETS: 362 10*3/uL (ref 150–400)
Platelets: 362 10*3/uL (ref 150–400)
RBC: 3.58 MIL/uL — ABNORMAL LOW (ref 4.22–5.81)
RBC: 3.47 MIL/uL — ABNORMAL LOW (ref 4.22–5.81)
RBC: 3.18 MIL/uL — ABNORMAL LOW (ref 4.22–5.81)
RDW: 17 % — ABNORMAL HIGH (ref 11.5–15.5)
RDW: 17.5 % — AB (ref 11.5–15.5)
RDW: 17.4 % — ABNORMAL HIGH (ref 11.5–15.5)
WBC: 16.4 10*3/uL — ABNORMAL HIGH (ref 4.0–10.5)
WBC: 17.7 10*3/uL — AB (ref 4.0–10.5)
WBC: 17.5 10*3/uL — ABNORMAL HIGH (ref 4.0–10.5)

## 2014-07-21 LAB — COMPREHENSIVE METABOLIC PANEL
ALBUMIN: 2.4 g/dL — AB (ref 3.5–5.2)
ALT: 17 U/L (ref 0–53)
AST: 19 U/L (ref 0–37)
Alkaline Phosphatase: 65 U/L (ref 39–117)
Anion gap: 20 — ABNORMAL HIGH (ref 5–15)
BUN: 129 mg/dL — ABNORMAL HIGH (ref 6–23)
CO2: 16 mEq/L — ABNORMAL LOW (ref 19–32)
Calcium: 8.8 mg/dL (ref 8.4–10.5)
Chloride: 104 mEq/L (ref 96–112)
Creatinine, Ser: 1.55 mg/dL — ABNORMAL HIGH (ref 0.50–1.35)
GFR calc Af Amer: 50 mL/min — ABNORMAL LOW (ref >90)
GFR calc non Af Amer: 43 mL/min — ABNORMAL LOW (ref >90)
Glucose, Bld: 336 mg/dL — ABNORMAL HIGH (ref 70–99)
POTASSIUM: 5 meq/L (ref 3.7–5.3)
SODIUM: 140 meq/L (ref 137–147)
Total Bilirubin: 0.3 mg/dL (ref 0.3–1.2)
Total Protein: 5.9 g/dL — ABNORMAL LOW (ref 6.0–8.3)

## 2014-07-21 LAB — URINALYSIS, ROUTINE W REFLEX MICROSCOPIC
BILIRUBIN URINE: NEGATIVE
Glucose, UA: NEGATIVE mg/dL
Hgb urine dipstick: NEGATIVE
Ketones, ur: NEGATIVE mg/dL
Nitrite: NEGATIVE
PROTEIN: NEGATIVE mg/dL
Specific Gravity, Urine: 1.019 (ref 1.005–1.030)
UROBILINOGEN UA: 0.2 mg/dL (ref 0.0–1.0)
pH: 5 (ref 5.0–8.0)

## 2014-07-21 LAB — URINE MICROSCOPIC-ADD ON

## 2014-07-21 LAB — GLUCOSE, CAPILLARY
GLUCOSE-CAPILLARY: 280 mg/dL — AB (ref 70–99)
GLUCOSE-CAPILLARY: 250 mg/dL — AB (ref 70–99)
Glucose-Capillary: 321 mg/dL — ABNORMAL HIGH (ref 70–99)
Glucose-Capillary: 306 mg/dL — ABNORMAL HIGH (ref 70–99)
Glucose-Capillary: 312 mg/dL — ABNORMAL HIGH (ref 70–99)
Glucose-Capillary: 213 mg/dL — ABNORMAL HIGH (ref 70–99)

## 2014-07-21 LAB — PREPARE RBC (CROSSMATCH)

## 2014-07-21 LAB — POC OCCULT BLOOD, ED: FECAL OCCULT BLD: POSITIVE — AB

## 2014-07-21 LAB — LIPASE, BLOOD: Lipase: 53 U/L (ref 11–59)

## 2014-07-21 LAB — TROPONIN I

## 2014-07-21 MED ORDER — INSULIN GLARGINE 100 UNIT/ML ~~LOC~~ SOLN
10.0000 [IU] | Freq: Every day | SUBCUTANEOUS | Status: DC
  Administered 2014-07-21 – 2014-07-25 (×5): 10 [IU] via SUBCUTANEOUS
  Filled 2014-07-21 (×5): qty 0.1

## 2014-07-21 MED ORDER — ACETAMINOPHEN 325 MG PO TABS
650.0000 mg | ORAL_TABLET | Freq: Once | ORAL | Status: AC
  Administered 2014-07-21: 650 mg via ORAL
  Filled 2014-07-21: qty 2

## 2014-07-21 MED ORDER — INSULIN ASPART 100 UNIT/ML ~~LOC~~ SOLN
0.0000 [IU] | SUBCUTANEOUS | Status: DC
  Administered 2014-07-21: 3 [IU] via SUBCUTANEOUS
  Administered 2014-07-21: 5 [IU] via SUBCUTANEOUS
  Administered 2014-07-21: 7 [IU] via SUBCUTANEOUS
  Administered 2014-07-21: 3 [IU] via SUBCUTANEOUS
  Administered 2014-07-21: 7 [IU] via SUBCUTANEOUS
  Administered 2014-07-22 (×5): 2 [IU] via SUBCUTANEOUS
  Administered 2014-07-23: 1 [IU] via SUBCUTANEOUS
  Administered 2014-07-23 (×3): 2 [IU] via SUBCUTANEOUS
  Administered 2014-07-23: 1 [IU] via SUBCUTANEOUS
  Administered 2014-07-24: 4 [IU] via SUBCUTANEOUS
  Administered 2014-07-24: 1 [IU] via SUBCUTANEOUS
  Administered 2014-07-25: 2 [IU] via SUBCUTANEOUS
  Administered 2014-07-25: 1 [IU] via SUBCUTANEOUS

## 2014-07-21 MED ORDER — FUROSEMIDE 10 MG/ML IJ SOLN
20.0000 mg | Freq: Once | INTRAMUSCULAR | Status: AC
  Administered 2014-07-21: 20 mg via INTRAVENOUS
  Filled 2014-07-21: qty 2

## 2014-07-21 MED ORDER — BRIMONIDINE TARTRATE 0.2 % OP SOLN
1.0000 [drp] | Freq: Two times a day (BID) | OPHTHALMIC | Status: DC
  Administered 2014-07-21 – 2014-07-25 (×10): 1 [drp] via OPHTHALMIC
  Filled 2014-07-21: qty 5

## 2014-07-21 MED ORDER — DIPHENHYDRAMINE HCL 50 MG/ML IJ SOLN
25.0000 mg | Freq: Once | INTRAMUSCULAR | Status: AC
  Administered 2014-07-21: 25 mg via INTRAVENOUS
  Filled 2014-07-21: qty 1

## 2014-07-21 MED ORDER — ACETAMINOPHEN 325 MG PO TABS
650.0000 mg | ORAL_TABLET | Freq: Once | ORAL | Status: DC
Start: 2014-07-21 — End: 2014-07-21

## 2014-07-21 MED ORDER — SODIUM CHLORIDE 0.9 % IV SOLN: Freq: Once | INTRAVENOUS | Status: DC

## 2014-07-21 MED ORDER — PANTOPRAZOLE SODIUM 40 MG IV SOLR
40.0000 mg | Freq: Two times a day (BID) | INTRAVENOUS | Status: DC
  Administered 2014-07-24 – 2014-07-25 (×2): 40 mg via INTRAVENOUS
  Filled 2014-07-21 (×3): qty 40

## 2014-07-21 MED ORDER — MORPHINE SULFATE 2 MG/ML IJ SOLN
1.0000 mg | INTRAMUSCULAR | Status: DC | PRN
  Administered 2014-07-21 – 2014-07-25 (×4): 1 mg via INTRAVENOUS
  Filled 2014-07-21 (×5): qty 1

## 2014-07-21 MED ORDER — SODIUM CHLORIDE 0.9 % IV SOLN
8.0000 mg/h | INTRAVENOUS | Status: AC
  Administered 2014-07-21 – 2014-07-23 (×4): 8 mg/h via INTRAVENOUS
  Filled 2014-07-21 (×13): qty 80

## 2014-07-21 MED ORDER — COLLAGENASE 250 UNIT/GM EX OINT
TOPICAL_OINTMENT | Freq: Every day | CUTANEOUS | Status: DC
  Administered 2014-07-21 – 2014-07-25 (×4): via TOPICAL
  Filled 2014-07-21: qty 30

## 2014-07-21 MED ORDER — SODIUM CHLORIDE 0.9 % IV SOLN
INTRAVENOUS | Status: DC
  Administered 2014-07-23: 22:00:00 via INTRAVENOUS

## 2014-07-21 MED ORDER — ISOSORBIDE MONONITRATE ER 30 MG PO TB24
30.0000 mg | ORAL_TABLET | Freq: Every morning | ORAL | Status: DC
  Filled 2014-07-21: qty 1

## 2014-07-21 MED ORDER — ATENOLOL 50 MG PO TABS
50.0000 mg | ORAL_TABLET | Freq: Two times a day (BID) | ORAL | Status: DC
  Filled 2014-07-21 (×2): qty 1

## 2014-07-21 MED ORDER — SODIUM CHLORIDE 0.9 % IV SOLN
80.0000 mg | Freq: Once | INTRAVENOUS | Status: AC
  Administered 2014-07-21: 80 mg via INTRAVENOUS
  Filled 2014-07-21: qty 80

## 2014-07-21 MED ORDER — ACETAMINOPHEN 650 MG RE SUPP: 650.0000 mg | Freq: Four times a day (QID) | RECTAL | Status: DC | PRN

## 2014-07-21 MED ORDER — LATANOPROST 0.005 % OP SOLN
1.0000 [drp] | Freq: Every day | OPHTHALMIC | Status: DC
  Administered 2014-07-21 – 2014-07-23 (×3): 1 [drp] via OPHTHALMIC
  Filled 2014-07-21: qty 2.5

## 2014-07-21 MED ORDER — NITROGLYCERIN 0.4 MG SL SUBL: 0.4000 mg | SUBLINGUAL_TABLET | SUBLINGUAL | Status: DC | PRN

## 2014-07-21 MED ORDER — DIPHENHYDRAMINE HCL 50 MG/ML IJ SOLN: 25.0000 mg | Freq: Once | INTRAMUSCULAR | Status: DC

## 2014-07-21 MED ORDER — TIMOLOL MALEATE 0.5 % OP SOLN
1.0000 [drp] | Freq: Two times a day (BID) | OPHTHALMIC | Status: DC
  Administered 2014-07-21 – 2014-07-25 (×10): 1 [drp] via OPHTHALMIC
  Filled 2014-07-21: qty 5

## 2014-07-21 MED ORDER — ACETAMINOPHEN 325 MG PO TABS
650.0000 mg | ORAL_TABLET | Freq: Four times a day (QID) | ORAL | Status: DC | PRN
  Administered 2014-07-21 – 2014-07-23 (×3): 650 mg via ORAL
  Filled 2014-07-21 (×3): qty 2

## 2014-07-21 MED ORDER — ISOSORBIDE MONONITRATE ER 30 MG PO TB24
30.0000 mg | ORAL_TABLET | Freq: Every morning | ORAL | Status: DC
  Administered 2014-07-21: 30 mg via ORAL
  Filled 2014-07-21: qty 1

## 2014-07-21 MED ORDER — ONDANSETRON HCL 4 MG/2ML IJ SOLN: 4.0000 mg | Freq: Four times a day (QID) | INTRAMUSCULAR | Status: DC | PRN

## 2014-07-21 MED ORDER — ONDANSETRON HCL 4 MG PO TABS: 4.0000 mg | ORAL_TABLET | Freq: Four times a day (QID) | ORAL | Status: DC | PRN

## 2014-07-21 MED ORDER — ATENOLOL 50 MG PO TABS
50.0000 mg | ORAL_TABLET | Freq: Two times a day (BID) | ORAL | Status: DC
  Administered 2014-07-21: 50 mg via ORAL
  Filled 2014-07-21 (×3): qty 1

## 2014-07-21 MED ORDER — SODIUM CHLORIDE 0.9 % IV SOLN
INTRAVENOUS | Status: DC
  Administered 2014-07-22: 1000 mL via INTRAVENOUS
  Administered 2014-07-23: 03:00:00 via INTRAVENOUS

## 2014-07-21 MED ORDER — SODIUM CHLORIDE 0.9 % IJ SOLN
3.0000 mL | Freq: Two times a day (BID) | INTRAMUSCULAR | Status: DC
  Administered 2014-07-21 – 2014-07-25 (×3): 3 mL via INTRAVENOUS

## 2014-07-21 MED ORDER — BRIMONIDINE TARTRATE-TIMOLOL 0.2-0.5 % OP SOLN: 1.0000 [drp] | Freq: Two times a day (BID) | OPHTHALMIC | Status: DC

## 2014-07-21 NOTE — Progress Notes (Signed)
Reviewed pt's most recent VS and spoke w/ nurse; pt has had a small smear of dark stool but otherwise appears stable, so we will continue observation tonight with the plan to do egd in a.m. Under propofol sedation.  Florencia Reasonsobert V. Tijuana Scheidegger, M.D. [phone]

## 2014-07-21 NOTE — Progress Notes (Signed)
Spoke w/ Dr. Jerral RalphGhimire.  Pt's hgb has fallen a bit more, and BUN is up, consistent w/ possible ongoing bld loss altho vs remain stable and pt states he feels ok.  I am on call tonight and have advised charge nurse that I am available to scope pt emergently if he destabilizes.  Otherwise, I feel that it would be better to scope this pt electively w/ anesthesia suppt as is currently scheduled for 10 a.m. Tomorrow  Florencia Reasonsobert V. Harutyun Monteverde, M.D. [phone]

## 2014-07-21 NOTE — Progress Notes (Signed)
[facility]. [facility] a [facility]. [facility], [facility]. Called made to inpatient RNCM to make aware. Will continue to follow. Raiford NobleAtika Levonne Carreras, MSN- RN, BSN- [facility] Hospital Liaison-[phone]

## 2014-07-21 NOTE — Progress Notes (Signed)
Physical Therapy Wound Treatment Patient Details  Name: Nicholas [name] MRN: [id number] Date of Birth: August 28, 1940  Today's Date: 07/21/2014 Time: 3762-8315 Time Calculation (min): 44 min  Subjective  Subjective: I'm okay...nope I'm not hurting. Patient and Family Stated Goals: get it healed up Date of Onset: 07/21/14 Prior Treatments: chronic wound  Pain Score:    Wound Assessment  Pressure Ulcer 07/29/11 Stage IV - Full thickness tissue loss with exposed bone, tendon or muscle. sacrum (Active)  Dressing Type Foam 07/08/2014  8:00 AM  Dressing Intact 07/08/2014  8:00 AM  Dressing Change Frequency Twice a day 07/08/2014  8:00 AM  State of Healing Epithelialized 07/08/2014  8:00 AM  Site / Wound Assessment Clean;Dry;Pink 07/08/2014  8:00 AM  Drainage Amount Scant 07/08/2014  8:00 AM  Drainage Description No odor 07/08/2014  8:00 AM  Treatment Cleansed 07/06/2014  9:00 AM     Pressure Ulcer 07/29/11 Unstageable - Full thickness tissue loss in which the base of the ulcer is covered by slough (yellow, tan, gray, green or brown) and/or eschar (tan, brown or black) in the wound bed. left foot (Active)     Pressure Ulcer 07/29/11 Unstageable - Full thickness tissue loss in which the base of the ulcer is covered by slough (yellow, tan, gray, green or brown) and/or eschar (tan, brown or black) in the wound bed. (Active)     Pressure Ulcer Stage IV - Full thickness tissue loss with exposed bone, tendon or muscle. (Active)     Pressure Ulcer 08/25/13 Stage IV - Full thickness tissue loss with exposed bone, tendon or muscle. non healing pressure ulcer,  came w/ stage 4 per WOC (Active)  Dressing Type Foam 07/21/2014  9:46 AM  Dressing Changed 07/21/2014  1:45 AM  Site / Wound Assessment Yellow;Pale;Red 07/21/2014  1:45 AM  % Wound base Red or Granulating 30% 07/21/2014  1:45 AM  % Wound base Yellow 50% 07/21/2014  1:45 AM  % Wound base Other (Comment) 20% 07/21/2014  1:45 AM  Peri-wound Assessment  Excoriated;Other (Comment) 07/21/2014  1:45 AM  Wound Length (cm) 9 cm 07/21/2014  1:45 AM  Wound Width (cm) 6 cm 07/21/2014  1:45 AM  Wound Depth (cm) 3.3 cm 07/21/2014  1:45 AM  Tunneling (cm) 0 07/21/2014  1:45 AM  Undermining (cm) 0.6 07/21/2014  1:45 AM  Margins Unattached edges (unapproximated) 07/21/2014  1:45 AM  Drainage Amount Moderate 07/21/2014  1:45 AM  Drainage Description Serosanguineous 07/21/2014  1:45 AM  Treatment Other (Comment) 07/20/2014 10:09 PM     Pressure Ulcer 07/21/14 Stage III -  Full thickness tissue loss. Subcutaneous fat may be visible but bone, tendon or muscle are NOT exposed. Chronic sacral wound (Active)  Dressing Type Foam;Silicone dressing;Moist to dry 07/21/2014 11:00 AM  Dressing Changed 07/21/2014 11:00 AM  State of Healing Eschar 07/21/2014 11:00 AM  Site / Wound Assessment Clean;Yellow;Red;Pale 07/21/2014 11:00 AM  % Wound base Red or Granulating 40% 07/21/2014 11:00 AM  % Wound base Yellow 60% 07/21/2014 11:00 AM  % Wound base Black 0% 07/21/2014 11:00 AM  % Wound base Other (Comment) 0% 07/21/2014 11:00 AM  Peri-wound Assessment Excoriated 07/21/2014 11:00 AM  Wound Length (cm) 2.6 cm 07/21/2014 11:00 AM  Wound Width (cm) 6.2 cm 07/21/2014 11:00 AM  Wound Depth (cm) 2.2 cm 07/21/2014 11:00 AM  Drainage Amount Minimal 07/21/2014 11:00 AM  Drainage Description Serous 07/21/2014 11:00 AM  Treatment Cleansed;Debridement (Selective);Hydrotherapy (Ultrasonic mist);Packing (Saline gauze) 07/21/2014 11:00 AM     Wound  08/28/11 Other (Comment) Sacrum pressure ulcer , stage 4, full thickness loss with exposedbone, tendon and muscle  (Active)     Wound 08/28/11 Other (Comment) Foot Pressure ulcer on bottom of left foot, partial thickness loss, exudate evident. Approximately size of 1/2 dollar and approx. 1/4 inch deep (Active)     Wound 12/11/11 Diabetic ulcer Ankle Left (Active)     Wound / Incision (Open or Dehisced) 07/05/14 Other (Comment) Foot  Right dried ulcerations 2nd and 3rd digits (Active)  Dressing Type Cadexomer iodine 07/08/2014  8:00 AM  Dressing Changed Changed 07/06/2014  9:00 AM  Dressing Status Intact 07/08/2014  8:00 AM  Dressing Change Frequency Twice a day 07/08/2014  8:00 AM  Site / Wound Assessment Clean;Dry 07/08/2014  8:00 AM  Treatment Cleansed 07/07/2014  9:00 AM     Incision 08/27/13 Thigh Left (Active)   Hydrotherapy Ultrasonic mist  - wound location: sacrum Ultrasonic mist at 35KHz (+/-3KHz) at ___ percent: 100 % Ultrasonic mist therapy minutes: 7 min Selective Debridement Selective Debridement - Location: sacral Selective Debridement - Tools Used: Scalpel;Forceps Selective Debridement - Tissue Removed: fibrin, eschar   Wound Assessment and Plan  Wound Therapy - Assess/Plan/Recommendations Wound Therapy - Clinical Statement: pt can benefit from [place] mist to kill bacterial and make the wound acute again to help the body heal itself with help of caregivers Wound Therapy - Functional Problem List: decr mobility, decreased sensation Factors Delaying/Impairing Wound Healing: Immobility;Altered sensation Hydrotherapy Plan: Debridement;Patient/family education;Ultrasonic wound therapy $RemoveBefor'@35'MGwHmWerdztK$  KHz (+/- 3 KHz) Wound Therapy - Frequency: 6X / week Wound Therapy - Current Recommendations: PT Wound Therapy - Follow Up Recommendations: Skilled nursing facility;Wound Care Center Wound Plan: see above  Wound Therapy Goals- Improve the function of patient's integumentary system by progressing the wound(s) through the phases of wound healing (inflammation - proliferation - remodeling) by: Decrease Necrotic Tissue to: 30 Decrease Necrotic Tissue - Progress: Goal set today Increase Granulation Tissue to: 70 Increase Granulation Tissue - Progress: Goal set today  Goals will be updated until maximal potential achieved or discharge criteria met.  Discharge criteria: when goals achieved, discharge from hospital, MD  decision/surgical intervention, no progress towards goals, refusal/missing three consecutive treatments without notification or medical reason.  GP     Denika Krone, Tessie Fass 07/21/2014, 1:04 PM 07/21/2014  Donnella Sham, [place] [phone]  (pager)

## 2014-07-21 NOTE — Progress Notes (Signed)
Utilization review completed. Leeman Johnsey, RN, BSN. 

## 2014-07-21 NOTE — ED Notes (Signed)
REPORT ATTEMPTED. 

## 2014-07-21 NOTE — Progress Notes (Signed)
New Admission Note:   Arrival Method: Via stretcher from the ED Mental Orientation: A&O x 3 - disoriented to time - memory impairment Telemetry: Placed on Telemetry Box [phone] Assessment: Completed Skin:  Stage IV Sacral Pressure Ulcer and severe MSAD around the wound - called and received order for [facility] [employer] [facility] W[facility] Consult IV:  Right posterior forearm Pain: Denies Tubes:  Placed condom cath Safety Measures: Safety Fall Prevention Plan has been given, discussed and signed Admission: Completed [facility] [place] Orientation: Patient has been orientated to the room, unit and staff.  Family:  None at bedside  [facility].  He does have a history of MRSA but last MRSA PCR on 07/05/2014 was negative.  Patient is considered a high fall risk.  He understands and is in agreement.  No belongings came up with the patient from the emergency room.  Orders have been reviewed and implemented. Will continue to monitor the patient. Call light has been placed within reach and bed alarm has been activated.   Bernie CoveyKimberly Adelyna Brockman RN- Marsa ArisBC, [place] Phone number: [phone]

## 2014-07-21 NOTE — Progress Notes (Signed)
Inpatient Diabetes Program Recommendations  AACE/ADA: New Consensus Statement on Inpatient Glycemic Control (2013)  Target Ranges:  Prepandial:   less than 140 mg/dL      Peak postprandial:   less than 180 mg/dL (1-2 hours)      Critically ill patients:  140 - 180 mg/dL   Results for Nicholas [name], Verlin D (MRN [id number]) as of 07/21/2014 09:35  Ref. Range 07/21/2014 01:48 07/21/2014 04:08 07/21/2014 07:57  Glucose-Capillary Latest Range: 70-99 mg/dL 478321 (H) 295306 (H) 621312 (H)    Reason for assessment: elevated CBG  Diabetes history: Type 2 Outpatient Diabetes medications: Novolog 0-9 units tid with meals Current orders for Inpatient glycemic control: Novolog 0-9 units tid with meals  Please consider adding Lantus 10 units daily.  Susette RacerJulie Halsey Persaud, RN, BA, MHA, CDE Diabetes Coordinator Inpatient Diabetes Program  [phone] (Team Pager) [phone] Patrcia Dolly([place] Office) 07/21/2014 9:40 AM

## 2014-07-21 NOTE — Progress Notes (Signed)
INITIAL NUTRITION ASSESSMENT  DOCUMENTATION CODES Per approved criteria  -Obesity Unspecified   INTERVENTION: Once diet advances, provide Glucerna Shake po TID, each supplement provides 220 kcal and 10 grams of protein and 30 ml Prostat po BID.  NUTRITION DIAGNOSIS: Increased nutrient needs related to wound healing as evidenced by estimated nutrition needs.   Goal: Pt to meet >/= 90% of their estimated nutrition needs   Monitor:  Diet advancement, weight trends, labs, I/O's  Reason for Assessment: Low Braden Score  73 y.o. male  Admitting Dx: Hematemesis  ASSESSMENT: Pt with history of CAD status post CABG, diabetes mellitus, CHF, hypertension, peripheral vascular disease who was recently admitted for encephalopathy and at that time was attributed to psychosis and UTI presents to the ER from the nursing home after patient was found to have one episode of hematemesis.   Family was at pt bedside during time of visit. Family reports pt has had a decreased appetite over the past 1-2 days. [facility]. Spoke with RN at SNF taking care of pt and she reports pt has been eating well, however notices when po is poor pt usually has not had a BM yet. Pt does not take any oral supplements PTA. Noted pt with Stage IV pressure ulcers. RD to order supplements to aid in wound healing once diet advances. Noted pt with a 5.9% weight loss in 3 weeks.  Pt with no observed significant fat or muscle mass loss.  Labs: Low CO2 and GFR. High BUN and creatinine.  Height: Ht Readings from Last 1 Encounters:  07/21/14 5\' 10"  (1.778 m)    Weight: Wt Readings from Last 1 Encounters:  07/21/14 222 lb 10.6 oz (101 kg)  07/04/14 236 lbs  Ideal Body Weight: 153 lbs (adjusted for L AKA)  % Ideal Body Weight: 145%  Wt Readings from Last 10 Encounters:  07/21/14 222 lb 10.6 oz (101 kg)  07/08/14 224 lb 8 oz (101.833 kg)  09/28/13 231 lb (104.781 kg)  09/08/13 231 lb 11.3 oz  (105.1 kg)  08/30/13 248 lb 10.9 oz (112.8 kg)  06/28/13 291 lb (131.997 kg)  06/02/13 290 lb 9.1 oz (131.8 kg)  05/02/13 275 lb (124.739 kg)  12/21/12 270 lb (122.471 kg)  08/31/12 275 lb (124.739 kg)    Usual Body Weight: 230 lbs  % Usual Body Weight: 97%  BMI:  Body mass index is 31.95 kg/(m^2). Class I obesity  Estimated Nutritional Needs: Kcal: 2200-2400 Protein: 115-125 grams Fluid: 2.2 - 2.4 L/day  Skin: Stage IV pressure ulcer on sacrum  Diet Order: Diet NPO time specified Except for: Sips with Meds  EDUCATION NEEDS: -Education not appropriate at this time   Intake/Output Summary (Last 24 hours) at 07/21/14 1137 Last data filed at 07/21/14 0300  Gross per 24 hour  Intake      0 ml  Output      0 ml  Net      0 ml    Last BM: 12/17  Labs:   Recent Labs Lab 07/20/14 2153 07/20/14 2206 07/21/14 0724  NA 135* 138 140  K 4.1 3.9 5.0  CL 101 107 104  CO2 18*  --  16*  BUN 110* 104* 129*  CREATININE 1.21 1.30 1.55*  CALCIUM 8.7  --  8.8  GLUCOSE 246* 250* 336*    CBG (last 3)   Recent Labs  07/21/14 0148 07/21/14 0408 07/21/14 0757  GLUCAP 321* 306* 312*    Scheduled  Meds: . atenolol  50 mg Oral BID  . brimonidine  1 drop Both Eyes BID   And  . timolol  1 drop Both Eyes BID  . collagenase   Topical Daily  . insulin aspart  0-9 Units Subcutaneous 6 times per day  . isosorbide mononitrate  30 mg Oral q morning - 10a  . latanoprost  1 drop Both Eyes QHS  . [START ON 07/24/2014] pantoprazole (PROTONIX) IV  40 mg Intravenous Q12H  . sodium chloride  3 mL Intravenous Q12H    Continuous Infusions: . sodium chloride    . pantoprozole (PROTONIX) infusion 8 mg/hr (07/21/14 0701)    Past Medical History  Diagnosis Date  . Hypertension   . Peripheral vascular disease   . Ulcer     left foot  . Dyslipidemia   . Chronic venous insufficiency   . Sleep apnea   . Morbid obesity   . Diverticulosis   . Benign prostatic hypertrophy   .  Glaucoma   . Gout   . Depression   . Insomnia   . CAD (coronary artery disease), native coronary artery   . GERD (gastroesophageal reflux disease)   . Angina   . Pneumonia 12/11/11  . Type II diabetes mellitus   . Chronic daily headache     "~ all the time"  . Seizures   . Stroke ~ 1983    "little weaker right hand"  . MRSA (methicillin resistant staph aureus) culture positive Dec. 17, 2013     right heel infection    Past Surgical History  Procedure Laterality Date  . Appendectomy    . Lumbar laminectomy    . Mastoid debridement    . Knee and ankle surgery  1979    "caved in in a ditch; messed up legs"; right  . Cholecystectomy    . Back surgery      "5 or 6"  . Peripheral arterial stent graft      "don't remember which side"  . Coronary artery bypass graft  1983; 1992    "CABG X 5; CABG X 8"  . Lower extremity angiogram  01/21/2012    Procedure: LOWER EXTREMITY ANGIOGRAM;  Surgeon: Nada LibmanVance W Brabham, MD;  [facility];  Service: Vascular;  Laterality: Bilateral;  WITH POSSIBLE INTERVENTION  . Eye surgery      "laser; both eyes"  . Eye surgery  05/08/2012    Cataract Left eye  . Heel bx  Dec. 17, 2013    Right Heel Bx.    . Transluminal atherectomy popliteal artery  01/21/2012    Left  . Amputation Left 08/27/2013    Procedure: LEFT ABOVE THE KNEE AMPUTATION ;  Surgeon: Pryor OchoaJames D Lawson, MD;  [facility];  Service: Vascular;  Laterality: Left;  . Abdominal aortagram N/A 06/02/2012    Procedure: ABDOMINAL AORTAGRAM;  Surgeon: Nada LibmanVance W Brabham, MD;  [facility];  Service: Cardiovascular;  Laterality: N/A;    Marijean NiemannStephanie La, MS, RD, LDN Pager # [phone] After hours/ weekend pager # [phone]

## 2014-07-21 NOTE — Progress Notes (Addendum)
PATIENT DETAILS Name: Nicholas [name] Age: 73 y.o. Sex: male Date of Birth: 10/12/1940 Admit Date: 07/20/2014 Admitting Physician Rise Patience, MD JSH:FWYOV,ZCHYIF NEVILL, MD  Subjective: Had 2-3 black stools this morning. No further hematemesis.  Assessment/Plan: Active Problems:   Upper GI bleed: Continue with PPI infusion, monitor CBC. Transfuse prn. GI following, planning EGD tomorrow with general anesthesia. Remains hemodynamically stable.    Acute blood loss anemia: Secondary to above. Will monitor CBC, if significant drop in hemoglobin we'll transfuse PRBC.    Type 2 diabetes:start Lantus 10 units, c/w SSI, currently NPO-avoid tight control-as risk of hypoglycemia-suspect met acidosis from ARF.    HTN: BP currently soft-hold atenolol, Imdur-has still having some melanotic stools.    Acute renal failure: Likely secondary to GI bleed. Start IV fluids, recheck electrolytes in a.m.    Leukocytosis: no fever, no PNA on CXR, UA pending. Monitor off Abx,looks non toxic.Blood culture.Follow closely    Chronic diastolic heart failure: Clinically compensated. Watch closely while on IV fluids.    Hx of CAD-S/P CABG:assymptomatic-anti-platelets currently on hold    Hx if CVA: non focal exam-anti-platelets currently on hold    Suspected Dementia:per family he has developed intermittent periods of confusion over the past few months, they give hx of sundowning. Suspect ? Vascular dementia.Currently very pleasant, and cooperative.    Hx of PAD-s/p Left BKA    Stage IV Pressure Ulcer:present on admission.Appreciate wound care eval  Disposition: Remain inpatient  Antibiotics:  None  Anti-infectives    None      DVT Prophylaxis:  SCD's  Code Status:  DNR-reconfirmed with family  Family Communication Son/spouse at bedside  Procedures:  None  CONSULTS:  GI  Time spent 40 minutes-which includes 50% of the time with face-to-face with patient/ family  and coordinating care related to the above assessment and plan.  MEDICATIONS: Scheduled Meds: . atenolol  50 mg Oral BID  . brimonidine  1 drop Both Eyes BID   And  . timolol  1 drop Both Eyes BID  . collagenase   Topical Daily  . insulin aspart  0-9 Units Subcutaneous 6 times per day  . isosorbide mononitrate  30 mg Oral q morning - 10a  . latanoprost  1 drop Both Eyes QHS  . [START ON 07/24/2014] pantoprazole (PROTONIX) IV  40 mg Intravenous Q12H  . sodium chloride  3 mL Intravenous Q12H   Continuous Infusions: . sodium chloride    . pantoprozole (PROTONIX) infusion 8 mg/hr (07/21/14 0701)   PRN Meds:.acetaminophen **OR** acetaminophen, morphine injection, nitroGLYCERIN, ondansetron **OR** ondansetron (ZOFRAN) IV    PHYSICAL EXAM: Vital signs in last 24 hours: Filed Vitals:   07/21/14 0115 07/21/14 0200 07/21/14 0621 07/21/14 0932  BP: 108/78 107/55 116/62 117/63  Pulse:  98 108 97  Temp:  98.6 F (37 C) 98 F (36.7 C) 98.2 F (36.8 C)  TempSrc:    Oral  Resp:  $Remo'20 22 20  'Ehktq$ Height:  $Remove'5\' 10"'sBLPEbu$  (1.778 m)    Weight:  101 kg (222 lb 10.6 oz)    SpO2:  100% 100% 100%    Weight change:  Filed Weights   07/21/14 0200  Weight: 101 kg (222 lb 10.6 oz)   Body mass index is 31.95 kg/(m^2).   Gen Exam: Awake and alert with clear speech.   Neck: Supple, No JVD.   Chest: B/L Clear.   CVS: S1 S2 Regular, no murmurs.  Abdomen: soft, BS +,  non tender, non distended.  Extremities: no edema Left BKA Neurologic: Non Focal.   Skin: No Rash.  Wounds: N/A.    Intake/Output from previous day:  Intake/Output Summary (Last 24 hours) at 07/21/14 1315 Last data filed at 07/21/14 0300  Gross per 24 hour  Intake      0 ml  Output      0 ml  Net      0 ml     LAB RESULTS: CBC  Recent Labs Lab 07/20/14 2153 07/20/14 2206 07/21/14 0255 07/21/14 0950  WBC 15.3*  --  16.4* 17.7*  HGB 10.3* 11.2* 9.6* 9.4*  HCT 32.5* 33.0* 30.0* 29.2*  PLT 335  --  339 362  MCV 84.2  --  83.8  84.1  MCH 26.7  --  26.8 27.1  MCHC 31.7  --  32.0 32.2  RDW 17.0*  --  17.0* 17.5*    Chemistries   Recent Labs Lab 07/20/14 2153 07/20/14 2206 07/21/14 0724  NA 135* 138 140  K 4.1 3.9 5.0  CL 101 107 104  CO2 18*  --  16*  GLUCOSE 246* 250* 336*  BUN 110* 104* 129*  CREATININE 1.21 1.30 1.55*  CALCIUM 8.7  --  8.8    CBG:  Recent Labs Lab 07/21/14 0148 07/21/14 0408 07/21/14 0757 07/21/14 1204  GLUCAP 321* 306* 312* 280*    GFR Estimated Creatinine Clearance: 51.3 mL/min (by C-G formula based on Cr of 1.55).  Coagulation profile  Recent Labs Lab 07/20/14 2153  INR 1.21    Cardiac Enzymes  Recent Labs Lab 07/21/14 0255  TROPONINI <0.30    Invalid input(s): POCBNP No results for input(s): DDIMER in the last 72 hours. No results for input(s): HGBA1C in the last 72 hours. No results for input(s): CHOL, HDL, LDLCALC, TRIG, CHOLHDL, LDLDIRECT in the last 72 hours. No results for input(s): TSH, T4TOTAL, T3FREE, THYROIDAB in the last 72 hours.  Invalid input(s): FREET3 No results for input(s): VITAMINB12, FOLATE, FERRITIN, TIBC, IRON, RETICCTPCT in the last 72 hours.  Recent Labs  07/21/14 0255  LIPASE 53    Urine Studies No results for input(s): UHGB, CRYS in the last 72 hours.  Invalid input(s): UACOL, UAPR, USPG, UPH, UTP, UGL, UKET, UBIL, UNIT, UROB, ULEU, UEPI, UWBC, URBC, UBAC, CAST, UCOM, BILUA  MICROBIOLOGY: No results found for this or any previous visit (from the past 240 hour(s)).  RADIOLOGY STUDIES/RESULTS: Dg Chest 2 View  07/04/2014   CLINICAL DATA:  Acute encephalopathy.  Weakness.  EXAM: CHEST  2 VIEW  COMPARISON:  08/26/2013  FINDINGS: Mild enlargement of the heart is unchanged. 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 Lung markings are mildly prominent but unchanged. No focal airspace disease. No significant pulmonary edema. Trachea is midline. Mild blunting at the right costophrenic angle appears chronic. Degenerative changes in  the thoracic spine without large pleural effusions. Small retrocardiac density is compatible with a hiatal hernia.a  IMPRESSION: No acute chest findings.   Electronically Signed   By: Richarda Overlie M.D.   On: 07/04/2014 15:48   Ct Head Wo Contrast  07/03/2014   CLINICAL DATA:  Altered mental status  EXAM: CT HEAD WITHOUT CONTRAST  TECHNIQUE: Contiguous axial images were obtained from the base of the skull through the vertex without intravenous contrast.  COMPARISON:  CT head 05/30/2013  FINDINGS: Age-appropriate atrophy.  Negative for hydrocephalus  Mild chronic microvascular ischemic changes in the white matter. Negative for acute infarct. Negative for hemorrhage or mass  Prior mastoidectomy on the right. Left mastoid sinus effusion and middle ear effusion no acute bony abnormality.  IMPRESSION: Mild atrophy and mild chronic microvascular ischemic change in the white matter. No acute abnormality.  Left middle ear and mastoid sinus effusion.   Electronically Signed   By: Franchot Gallo M.D.   On: 07/03/2014 13:38   Mr Brain Wo Contrast  07/04/2014   CLINICAL DATA:  Altered mental status and agitation. Hallucinations.  EXAM: MRI HEAD WITHOUT CONTRAST  TECHNIQUE: Multiplanar, multiecho pulse sequences of the brain and surrounding structures were obtained without intravenous contrast.  COMPARISON:  CT head without contrast 07/03/2014.  FINDINGS: The study is mildly degraded by patient motion. No acute infarct, hemorrhage, or mass lesion is present. Mild generalized atrophy and periventricular white matter changes are noted bilaterally. There are remote lacunar infarcts involving the right caudate head and anterior limb internal capsule. The ventricles are proportionate to the degree of atrophy. Flow is present in the major intracranial arteries. Midline images demonstrate degenerative changes in the upper cervical spine.  Paranasal sinuses are clear. Minimal fluid is present in the inferior right mastoid air  cells. No obstructing nasopharyngeal lesion is evident. There is fluid in the left mastoid air cells and middle ear cavity. No obstructing nasopharyngeal lesion is evident.  IMPRESSION: 1. Mild atrophy and white matter disease likely reflects the sequela of chronic microvascular ischemia. 2. Remote lacunar infarcts of the right basal ganglia. 3. No acute intracranial abnormality. 4. Degenerative changes of the upper cervical spine. 5. Left greater right mastoid effusions. No obstructing nasopharyngeal lesion is evident.   Electronically Signed   By: Lawrence Santiago M.D.   On: 07/04/2014 19:07   Dg Abd Acute W/chest  07/20/2014   CLINICAL DATA:  Hematemesis.  EXAM: ACUTE ABDOMEN SERIES (ABDOMEN 2 VIEW & CHEST 1 VIEW)  COMPARISON:  July 04, 2014.  FINDINGS: There is no evidence of dilated bowel loops or free intraperitoneal air. No radiopaque calculi or other significant radiographic abnormality is seen. Heart size and mediastinal contours are within normal limits. Both lungs are clear.  IMPRESSION: No evidence of bowel obstruction or ileus. No acute cardiopulmonary disease.   Electronically Signed   By: Sabino Dick M.D.   On: 07/20/2014 22:47    Oren Binet, MD  [facility] Pager:[phone]  If 7PM-7AM, please contact night-coverage [email or web address] Password [email or web address] 07/21/2014, 1:15 PM   LOS: 1 day

## 2014-07-21 NOTE — Progress Notes (Signed)
Called to receive report.  Nurse not available at the time.  Bernie CoveyKimberly Tyheem Boughner RN-BC, [employer]

## 2014-07-21 NOTE — Consult Note (Signed)
Referring Provider: Dr. Toniann FailKakrakandy Primary Care Physician:  Pearla DubonnetGATES,Yossi Hinchman NEVILL, MD Primary Gastroenterologist:  Gentry FitzUnassigned  Reason for Consultation:  GI bleeding  HPI: Nicholas [name] is a 73 y.o. male referred from a nursing home. He is not a good historian so the history is obtained from the record. He was brought from the nursing home last night following, apparently, an episode of hematemesis. The patient does not recall an episode of hematemesis. In any event, he is on aspirin, Plavix, and pantoprazole. Apparently, no prior history of GI bleeding. In the emergency room, his hemoglobin was noted to have dropped from 13.9 on December 4, to 10.3 and overnight it has drifted further down to a current level of 9.6. The patient is hemodynamically stable. His BUN, which normally runs in the 40s, was 110 on admission (not repeated this morning). He was admitted recently for psychosis and has medical problems including coronary disease, diabetes with peripheral vascular disease status post left AKA, and significant obesity and sleep apnea.   Past Medical History  Diagnosis Date  . Hypertension   . Peripheral vascular disease   . Ulcer     left foot  . Dyslipidemia   . Chronic venous insufficiency   . Sleep apnea   . Morbid obesity   . Diverticulosis   . Benign prostatic hypertrophy   . Glaucoma   . Gout   . Depression   . Insomnia   . CAD (coronary artery disease), native coronary artery   . GERD (gastroesophageal reflux disease)   . Angina   . Pneumonia 12/11/11  . Type II diabetes mellitus   . Chronic daily headache     "~ all the time"  . Seizures   . Stroke ~ 1983    "little weaker right hand"  . MRSA (methicillin resistant staph aureus) culture positive Dec. 17, 2013     right heel infection    Past Surgical History  Procedure Laterality Date  . Appendectomy    . Lumbar laminectomy    . Mastoid debridement    . Knee and ankle surgery  1979    "caved in in a ditch; messed  up legs"; right  . Cholecystectomy    . Back surgery      "5 or 6"  . Peripheral arterial stent graft      "don't remember which side"  . Coronary artery bypass graft  1983; 1992    "CABG X 5; CABG X 8"  . Lower extremity angiogram  01/21/2012    Procedure: LOWER EXTREMITY ANGIOGRAM;  Surgeon: Nada LibmanVance W Brabham, MD;  [facility];  Service: Vascular;  Laterality: Bilateral;  WITH POSSIBLE INTERVENTION  . Eye surgery      "laser; both eyes"  . Eye surgery  05/08/2012    Cataract Left eye  . Heel bx  Dec. 17, 2013    Right Heel Bx.    . Transluminal atherectomy popliteal artery  01/21/2012    Left  . Amputation Left 08/27/2013    Procedure: LEFT ABOVE THE KNEE AMPUTATION ;  Surgeon: Pryor OchoaJames D Lawson, MD;  [facility];  Service: Vascular;  Laterality: Left;  . Abdominal aortagram N/A 06/02/2012    Procedure: ABDOMINAL AORTAGRAM;  Surgeon: Nada LibmanVance W Brabham, MD;  [facility];  Service: Cardiovascular;  Laterality: N/A;    Prior to Admission medications   Medication Sig Start Date End Date Taking? Authorizing Provider  allopurinol (ZYLOPRIM) 300 MG tablet Take 1 tablet (300 mg total) by  mouth every morning. 09/08/13  Yes Daniel J Angiulli, PA-C  Amino Acids-Protein Hydrolys (FEEDING SUPPLEMENT, PRO-STAT SUGAR FREE 64,) LIQD Take 30 mLs by mouth 2 (two) times daily with a meal. 07/08/14  Yes Alison Murray, MD  aspirin 81 MG EC tablet Take 1 tablet (81 mg total) by mouth daily. Patient taking differently: Take 81 mg by mouth daily with breakfast.  09/08/13  Yes Daniel J Angiulli, PA-C  atenolol (TENORMIN) 50 MG tablet Take 1 tablet (50 mg total) by mouth 2 (two) times daily. 09/08/13  Yes Daniel J Angiulli, PA-C  bisacodyl (DULCOLAX) 10 MG suppository Place 10 mg rectally as needed for moderate constipation (constipation not relieved by Milk of Magnesium).   Yes Historical Provider, MD  brimonidine-timolol (COMBIGAN) 0.2-0.5 % ophthalmic solution Place 1 drop into both eyes every 12 (twelve)  hours.    Yes Historical Provider, MD  clopidogrel (PLAVIX) 75 MG tablet Take 1 tablet (75 mg total) by mouth every morning. 09/08/13  Yes Daniel J Angiulli, PA-C  furosemide (LASIX) 40 MG tablet Take 1 tablet (40 mg total) by mouth every morning. 07/08/14  Yes Alison Murray, MD  HYDROcodone-acetaminophen [facility]) 10-325 MG per tablet Take one tablet by mouth three times daily for pain 07/18/14  Yes Sharon Seller, NP  insulin aspart (NOVOLOG) 100 UNIT/ML injection Inject 0-9 Units into the skin 3 (three) times daily with meals. 07/08/14  Yes Alison Murray, MD  isosorbide mononitrate (IMDUR) 30 MG 24 hr tablet Take 1 tablet (30 mg total) by mouth every morning. 09/08/13  Yes Daniel J Angiulli, PA-C  latanoprost (XALATAN) 0.005 % ophthalmic solution Place 1 drop into both eyes at bedtime.     Yes Historical Provider, MD  magnesium hydroxide (MILK OF MAGNESIA) 400 MG/5ML suspension Take 30 mLs by mouth daily as needed for mild constipation.   Yes Historical Provider, MD  magnesium oxide (MAG-OX) 400 MG tablet Take 400 mg by mouth daily with breakfast.   Yes Historical Provider, MD  mirtazapine (REMERON) 15 MG tablet Take 1 tablet (15 mg total) by mouth at bedtime. 07/08/14  Yes Alison Murray, MD  Multiple Vitamin (MULTIVITAMIN WITH MINERALS) TABS tablet Take 1 tablet by mouth daily. 07/08/14  Yes Alison Murray, MD  pantoprazole (PROTONIX) 40 MG tablet Take 1 tablet (40 mg total) by mouth daily. 07/08/14  Yes Alison Murray, MD  perphenazine (TRILAFON) 16 MG tablet Take 16 mg by mouth at bedtime.   Yes Historical Provider, MD  potassium chloride SA (K-DUR,KLOR-CON) 20 MEQ tablet Take 1 tablet (20 mEq total) by mouth daily. 07/08/14  Yes Alison Murray, MD  risperiDONE (RISPERDAL) 0.5 MG tablet Take 2 tablets (1 mg total) by mouth at bedtime. 07/08/14  Yes Alison Murray, MD  simvastatin (ZOCOR) 20 MG tablet Take 1 tablet (20 mg total) by mouth every evening. 09/08/13  Yes Daniel J Angiulli, PA-C  sodium phosphate  (FLEET) 7-19 GM/118ML ENEM Place 1 enema rectally once as needed for severe constipation (for constipation not relieved by milk of magnesium).   Yes Historical Provider, MD  tamsulosin (FLOMAX) 0.4 MG CAPS capsule Take 2 capsules (0.8 mg total) by mouth at bedtime. 09/08/13  Yes Daniel J Angiulli, PA-C  alum & mag hydroxide-simeth (MAALOX/MYLANTA) 200-200-20 MG/5ML suspension Take 30 mLs by mouth as needed for indigestion or heartburn. 07/08/14   Alison Murray, MD  diclofenac sodium (VOLTAREN) 1 % GEL Apply 1 application topically 4 (four) times daily as needed. Applies  to both knees as needed for pain 09/08/13   Mcarthur Rossettianiel J Angiulli, PA-C  feeding supplement, GLUCERNA SHAKE, (GLUCERNA SHAKE) LIQD Take 237 mLs by mouth 2 (two) times daily between meals. 07/08/14   Alison MurrayAlma M Devine, MD  nitrofurantoin, macrocrystal-monohydrate, (MACROBID) 100 MG capsule Take 1 capsule (100 mg total) by mouth 2 (two) times daily. Patient not taking: Reported on 07/20/2014 07/08/14   Alison MurrayAlma M Devine, MD  nitroGLYCERIN (NITROSTAT) 0.4 MG SL tablet Place 1 tablet (0.4 mg total) under the tongue every 5 (five) minutes as needed. For chest pain 09/08/13   Mcarthur Rossettianiel J Angiulli, PA-C  ondansetron (ZOFRAN) 4 MG tablet Take 1 tablet (4 mg total) by mouth every 6 (six) hours as needed for nausea. 07/08/14   Alison MurrayAlma M Devine, MD    Current Facility-Administered Medications  Medication Dose Route Frequency Provider Last Rate Last Dose  . 0.9 %  sodium chloride infusion   Intravenous Continuous Eduard ClosArshad N Kakrakandy, MD      . acetaminophen (TYLENOL) tablet 650 mg  650 mg Oral Q6H PRN Eduard ClosArshad N Kakrakandy, MD       Or  . acetaminophen (TYLENOL) suppository 650 mg  650 mg Rectal Q6H PRN Eduard ClosArshad N Kakrakandy, MD      . atenolol (TENORMIN) tablet 50 mg  50 mg Oral BID Maretta BeesShanker M Ghimire, MD      . brimonidine (ALPHAGAN) 0.2 % ophthalmic solution 1 drop  1 drop Both Eyes BID Eduard ClosArshad N Kakrakandy, MD   1 drop at 07/21/14 0342   And  . timolol (TIMOPTIC) 0.5 %  ophthalmic solution 1 drop  1 drop Both Eyes BID Eduard ClosArshad N Kakrakandy, MD   1 drop at 07/21/14 0342  . collagenase (SANTYL) ointment   Topical Daily Shanker Levora DredgeM Ghimire, MD      . insulin aspart (novoLOG) injection 0-9 Units  0-9 Units Subcutaneous 6 times per day Eduard ClosArshad N Kakrakandy, MD   7 Units at 07/21/14 0434  . isosorbide mononitrate (IMDUR) 24 hr tablet 30 mg  30 mg Oral q morning - 10a Shanker Levora DredgeM Ghimire, MD      . latanoprost (XALATAN) 0.005 % ophthalmic solution 1 drop  1 drop Both Eyes QHS Eduard ClosArshad N Kakrakandy, MD      . morphine 2 MG/ML injection 1 mg  1 mg Intravenous Q4H PRN Eduard ClosArshad N Kakrakandy, MD      . nitroGLYCERIN (NITROSTAT) SL tablet 0.4 mg  0.4 mg Sublingual Q5 min PRN Eduard ClosArshad N Kakrakandy, MD      . ondansetron [facility](ZOFRAN) tablet 4 mg  4 mg Oral Q6H PRN Eduard ClosArshad N Kakrakandy, MD       Or  . ondansetron [facility](ZOFRAN) injection 4 mg  4 mg Intravenous Q6H PRN Eduard ClosArshad N Kakrakandy, MD      . pantoprazole (PROTONIX) 80 mg in sodium chloride 0.9 % 250 mL (0.32 mg/mL) infusion  8 mg/hr Intravenous Continuous Eduard ClosArshad N Kakrakandy, MD 25 mL/hr at 07/21/14 0701 8 mg/hr at 07/21/14 0701  . [START ON 07/24/2014] pantoprazole (PROTONIX) injection 40 mg  40 mg Intravenous Q12H Eduard ClosArshad N Kakrakandy, MD      . sodium chloride 0.9 % injection 3 mL  3 mL Intravenous Q12H Eduard ClosArshad N Kakrakandy, MD   3 mL at 07/21/14 0342    Allergies as of 07/20/2014 - Review Complete 07/20/2014  Allergen Reaction Noted  . Contrast media [iodinated diagnostic agents] Other (See Comments) 06/02/2012    Family History  Problem Relation Age of Onset  . Hypertension Other   .  Cancer Other   . Cancer Sister   . Deep vein thrombosis Brother   . Heart disease Brother   . Deep vein thrombosis Brother   . Heart disease Brother   . Cancer Sister     History   Social History  . Marital Status: Married    Spouse Name: N/A    Number of Children: N/A  . Years of Education: N/A   Occupational History  . Not on file.    Social History Main Topics  . Smoking status: Former Smoker -- 1.00 packs/day for 20 years    Types: Cigarettes    Quit date: 07/29/1991  . Smokeless tobacco: Former Neurosurgeon    Types: Chew    Quit date: 08/06/1987  . Alcohol Use: No     Comment: occasionallly  . Drug Use: No  . Sexual Activity: Not Currently   Other Topics Concern  . Not on file   Social History Narrative    Review of Systems: Unobtainable in any reliable sense  Physical Exam: Vital signs in last 24 hours: Temp:  [98 F (36.7 C)-98.6 F (37 C)] 98 F (36.7 C) (12/17 0621) Pulse Rate:  [83-108] 108 (12/17 0621) Resp:  [20-22] 22 (12/17 0621) BP: (107-136)/(55-79) 116/62 mmHg (12/17 0621) SpO2:  [100 %] 100 % (12/17 0621) Weight:  [101 kg (222 lb 10.6 oz)] 101 kg (222 lb 10.6 oz) (12/17 0200) Last BM Date: 07/21/14 This is an overweight African-American male who appears slightly pale, but the skin is warm and dry and he is in no acute distress, does not appear shocky. He is awake and alert and pleasant but does not appear to have complete insight into his current condition. He is anicteric. The chest is clear and the heart is without gallops, rubs, murmurs, or arrhythmias. The abdomen is obese but nontender.  Intake/Output from previous day:   Intake/Output this shift:    Lab Results:  Recent Labs  07/20/14 2153 07/20/14 2206 07/21/14 0255  WBC 15.3*  --  16.4*  HGB 10.3* 11.2* 9.6*  HCT 32.5* 33.0* 30.0*  PLT 335  --  339   BMET  Recent Labs  07/20/14 2153 07/20/14 2206  NA 135* 138  K 4.1 3.9  CL 101 107  CO2 18*  --   GLUCOSE 246* 250*  BUN 110* 104*  CREATININE 1.21 1.30  CALCIUM 8.7  --    LFT  Recent Labs  07/20/14 2153  PROT 5.8*  ALBUMIN 2.4*  AST 15  ALT 17  ALKPHOS 66  BILITOT 0.2*   PT/INR  Recent Labs  07/20/14 2153  LABPROT 15.4*  INR 1.21    Studies/Results: Dg Abd Acute W/chest  07/20/2014   CLINICAL DATA:  Hematemesis.  EXAM: ACUTE ABDOMEN SERIES  (ABDOMEN 2 VIEW & CHEST 1 VIEW)  COMPARISON:  July 04, 2014.  FINDINGS: There is no evidence of dilated bowel loops or free intraperitoneal air. No radiopaque calculi or other significant radiographic abnormality is seen. Heart size and mediastinal contours are within normal limits. Both lungs are clear.  IMPRESSION: No evidence of bowel obstruction or ileus. No acute cardiopulmonary disease.   Electronically Signed   By: Roque Lias M.D.   On: 07/20/2014 22:47    Impression: Transient GI bleed, not frankly destabilizing, characterized by (reported) hematemesis and significant drop in hemoglobin and rising BUN. It is somewhat unusual to get ulcer disease from low dose aspirin while on PPI prophylaxis, raising the question of alternative sources of  bleeding in the upper tract. Meanwhile, this patient is not an optimal endoscopy candidate. I think we would do best, if possible, to do it under propofol sedation, and ideally obtaining consent from the family member as well as from the patient. Since he is not actively bleeding or frankly unstable at this time, I would favor continued close monitoring, supportive care, and deferral of endoscopy for the time being. Depending on his clinical evolution and the ability or inability to reach an appropriate responsible party, plus availability on the endoscopy schedule for monitored anesthesia care, we might consider doing an endoscopy on him tomorrow. Or of course sooner if he becomes unstable.  Plan:  This patient is not an optimal endoscopy candidate. I think we would do best, if possible, to do it under propofol sedation, and ideally obtaining consent from a family member as well as from the patient. Since he is not actively bleeding or frankly unstable at this time, I would favor continued close monitoring, supportive care, and deferral of endoscopy for the time being. Depending on his clinical evolution and the ability or inability to reach an appropriate  responsible party, plus availability on the endoscopy schedule for monitored anesthesia care, we might consider doing an endoscopy on him tomorrow, or of course sooner if he becomes unstable.   LOS: 1 day   Sedra Morfin V  07/21/2014, 8:03 AM

## 2014-07-21 NOTE — Consult Note (Addendum)
WOC wound consult note Reason for Consult: sacral pressure ulcer, chronic since 2012.  Pt from SNF.  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 3.5cm x 4.0cm x 3.0cm and there is an extended area (the larger measurement) that includes an areas of periwound MASD (moisture associated skin damage).  Pt is incontinent of bowel and bladder and has been admitted with possible GI bleeding.  Wound was covered in stool at the time of admission to the floor.  Wound bed: 90% yellow, fibrin with some dark areas but unclear if this is tarry stool. Staff attempted to clean wound out. Does not appear infected Drainage (amount, consistency, odor) moderate, serosanguinous, no odor Periwound: see above Dressing procedure/placement/frequency: Enzymatic debridement ointment to clear away the fibrin.  May need hydrotherapy, will discuss with PT.  Air mattress ordered.    WOC team will follow along with you for weekly wound assessments.  Please notify me of any acute changes in the wounds or any new areas of concerns Armen PickupMelody Merrick Feutz RN,CWOCN [phone]

## 2014-07-21 NOTE — H&P (Signed)
[facility] History and Physical  Nicholas [name] ZOX:[id number] DOB: [birth year] DOA: 07/20/2014  Referring physician: ER physician. PCP: Pearla Dubonnet, MD  Chief Complaint: Hematemesis.  HPI: Nicholas [name] is a 73 y.o. male with history of CAD status post CABG, diabetes mellitus, CHF, hypertension, peripheral vascular disease who was recently admitted for encephalopathy and at that time was attributed to psychosis and UTI presents to the ER from the nursing home after patient was found to have one episode of hematemesis. Patient stays around evening patient after supper throughout and it was frankly bloody. Patient also has some epigastric discomfort. Denies any chest pain or shortness of breath. In the ER patient's hemoglobin was found to be around 10 and a drop of around 3 g from previous. Patient otherwise is hemodynamically stable. On-call gastroenterologist for [facility] physicians was consulted by ER physician and patient will be admitted for further workup of hematemesis. Patient is on antiplatelet agents for his known history of CAD. Patient's abdominal pain is markedly improved and acute abdominal series is unremarkable and on exam patient's abdomen appears benign.  Review of Systems: As presented in the history of presenting illness, rest negative.  Past Medical History  Diagnosis Date  . Hypertension   . Peripheral vascular disease   . Ulcer     left foot  . Dyslipidemia   . Chronic venous insufficiency   . Sleep apnea   . Morbid obesity   . Diverticulosis   . Benign prostatic hypertrophy   . Glaucoma   . Gout   . Depression   . Insomnia   . CAD (coronary artery disease), native coronary artery   . GERD (gastroesophageal reflux disease)   . Angina   . Pneumonia 12/11/11  . Type II diabetes mellitus   . Chronic daily headache     "~ all the time"  . Seizures   . Stroke ~ 1983    "little weaker right hand"  . MRSA (methicillin resistant staph aureus)  culture positive Dec. 17, 2013     right heel infection   Past Surgical History  Procedure Laterality Date  . Appendectomy    . Lumbar laminectomy    . Mastoid debridement    . Knee and ankle surgery  1979    "caved in in a ditch; messed up legs"; right  . Cholecystectomy    . Back surgery      "5 or 6"  . Peripheral arterial stent graft      "don't remember which side"  . Coronary artery bypass graft  1983; 1992    "CABG X 5; CABG X 8"  . Lower extremity angiogram  01/21/2012    Procedure: LOWER EXTREMITY ANGIOGRAM;  Surgeon: Nada Libman, MD;  [facility];  Service: Vascular;  Laterality: Bilateral;  WITH POSSIBLE INTERVENTION  . Eye surgery      "laser; both eyes"  . Eye surgery  05/08/2012    Cataract Left eye  . Heel bx  Dec. 17, 2013    Right Heel Bx.    . Transluminal atherectomy popliteal artery  01/21/2012    Left  . Amputation Left 08/27/2013    Procedure: LEFT ABOVE THE KNEE AMPUTATION ;  Surgeon: Pryor Ochoa, MD;  Location: [facility] OR;  Service: Vascular;  Laterality: Left;  . Abdominal aortagram N/A 06/02/2012    Procedure: ABDOMINAL AORTAGRAM;  Surgeon: Nada Libman, MD;  Location: [facility] CATH LAB;  Service: Cardiovascular;  Laterality: N/A;   Social History:  reports that he quit smoking about 22 years ago. His smoking use included Cigarettes. He has a 20 pack-year smoking history. He quit smokeless tobacco use about 26 years ago. His smokeless tobacco use included Chew. He reports that he does not drink alcohol or use illicit drugs. Where does patient live  nursing home. Can patient participate in ADLs?  Not sure.  Allergies  Allergen Reactions  . Contrast Media [Iodinated Diagnostic Agents] Other (See Comments)    Shaking and feeling terrible    Family History:  Family History  Problem Relation Age of Onset  . Hypertension Other   . Cancer Other   . Cancer Sister   . Deep vein thrombosis Brother   . Heart disease Brother   . Deep vein thrombosis  Brother   . Heart disease Brother   . Cancer Sister       Prior to Admission medications   Medication Sig Start Date End Date Taking? Authorizing Provider  allopurinol (ZYLOPRIM) 300 MG tablet Take 1 tablet (300 mg total) by mouth every morning. 09/08/13  Yes Daniel J Angiulli, PA-C  Amino Acids-Protein Hydrolys (FEEDING SUPPLEMENT, PRO-STAT SUGAR FREE 64,) LIQD Take 30 mLs by mouth 2 (two) times daily with a meal. 07/08/14  Yes Alison MurrayAlma M Devine, MD  aspirin 81 MG EC tablet Take 1 tablet (81 mg total) by mouth daily. Patient taking differently: Take 81 mg by mouth daily with breakfast.  09/08/13  Yes Daniel J Angiulli, PA-C  atenolol (TENORMIN) 50 MG tablet Take 1 tablet (50 mg total) by mouth 2 (two) times daily. 09/08/13  Yes Daniel J Angiulli, PA-C  bisacodyl (DULCOLAX) 10 MG suppository Place 10 mg rectally as needed for moderate constipation (constipation not relieved by Milk of Magnesium).   Yes Historical Provider, MD  brimonidine-timolol (COMBIGAN) 0.2-0.5 % ophthalmic solution Place 1 drop into both eyes every 12 (twelve) hours.    Yes Historical Provider, MD  clopidogrel (PLAVIX) 75 MG tablet Take 1 tablet (75 mg total) by mouth every morning. 09/08/13  Yes Daniel J Angiulli, PA-C  furosemide (LASIX) 40 MG tablet Take 1 tablet (40 mg total) by mouth every morning. 07/08/14  Yes Alison MurrayAlma M Devine, MD  HYDROcodone-acetaminophen [facility](NORCO) 10-325 MG per tablet Take one tablet by mouth three times daily for pain 07/18/14  Yes Sharon SellerJessica K Eubanks, NP  insulin aspart (NOVOLOG) 100 UNIT/ML injection Inject 0-9 Units into the skin 3 (three) times daily with meals. 07/08/14  Yes Alison MurrayAlma M Devine, MD  isosorbide mononitrate (IMDUR) 30 MG 24 hr tablet Take 1 tablet (30 mg total) by mouth every morning. 09/08/13  Yes Daniel J Angiulli, PA-C  latanoprost (XALATAN) 0.005 % ophthalmic solution Place 1 drop into both eyes at bedtime.     Yes Historical Provider, MD  magnesium hydroxide (MILK OF MAGNESIA) 400 MG/5ML suspension  Take 30 mLs by mouth daily as needed for mild constipation.   Yes Historical Provider, MD  magnesium oxide (MAG-OX) 400 MG tablet Take 400 mg by mouth daily with breakfast.   Yes Historical Provider, MD  mirtazapine (REMERON) 15 MG tablet Take 1 tablet (15 mg total) by mouth at bedtime. 07/08/14  Yes Alison MurrayAlma M Devine, MD  Multiple Vitamin (MULTIVITAMIN WITH MINERALS) TABS tablet Take 1 tablet by mouth daily. 07/08/14  Yes Alison MurrayAlma M Devine, MD  pantoprazole (PROTONIX) 40 MG tablet Take 1 tablet (40 mg total) by mouth daily. 07/08/14  Yes Alison MurrayAlma M Devine, MD  perphenazine (TRILAFON) 16 MG tablet Take 16 mg by mouth  at bedtime.   Yes Historical Provider, MD  potassium chloride SA (K-DUR,KLOR-CON) 20 MEQ tablet Take 1 tablet (20 mEq total) by mouth daily. 07/08/14  Yes Alison MurrayAlma M Devine, MD  risperiDONE (RISPERDAL) 0.5 MG tablet Take 2 tablets (1 mg total) by mouth at bedtime. 07/08/14  Yes Alison MurrayAlma M Devine, MD  simvastatin (ZOCOR) 20 MG tablet Take 1 tablet (20 mg total) by mouth every evening. 09/08/13  Yes Daniel J Angiulli, PA-C  sodium phosphate (FLEET) 7-19 GM/118ML ENEM Place 1 enema rectally once as needed for severe constipation (for constipation not relieved by milk of magnesium).   Yes Historical Provider, MD  tamsulosin (FLOMAX) 0.4 MG CAPS capsule Take 2 capsules (0.8 mg total) by mouth at bedtime. 09/08/13  Yes Daniel J Angiulli, PA-C  alum & mag hydroxide-simeth (MAALOX/MYLANTA) 200-200-20 MG/5ML suspension Take 30 mLs by mouth as needed for indigestion or heartburn. 07/08/14   Alison MurrayAlma M Devine, MD  diclofenac sodium (VOLTAREN) 1 % GEL Apply 1 application topically 4 (four) times daily as needed. Applies to both knees as needed for pain 09/08/13   Mcarthur Rossettianiel J Angiulli, PA-C  feeding supplement, GLUCERNA SHAKE, (GLUCERNA SHAKE) LIQD Take 237 mLs by mouth 2 (two) times daily between meals. 07/08/14   Alison MurrayAlma M Devine, MD  nitrofurantoin, macrocrystal-monohydrate, (MACROBID) 100 MG capsule Take 1 capsule (100 mg total) by mouth 2  (two) times daily. Patient not taking: Reported on 07/20/2014 07/08/14   Alison MurrayAlma M Devine, MD  nitroGLYCERIN (NITROSTAT) 0.4 MG SL tablet Place 1 tablet (0.4 mg total) under the tongue every 5 (five) minutes as needed. For chest pain 09/08/13   Mcarthur Rossettianiel J Angiulli, PA-C  ondansetron (ZOFRAN) 4 MG tablet Take 1 tablet (4 mg total) by mouth every 6 (six) hours as needed for nausea. 07/08/14   Alison MurrayAlma M Devine, MD    Physical Exam: Filed Vitals:   07/20/14 2300 07/20/14 2315 07/20/14 2330 07/20/14 2345  BP: 125/65 124/56 136/68 126/64  Pulse: 84 90 83 90  Temp:      TempSrc:      Resp:      SpO2: 100% 100% 100% 100%     General:  Well-developed and nourished.  Eyes:  Anicteric no pallor.  ENT:  No discharge from the ears eyes nose mouth.  Neck:  No mass felt.  Cardiovascular:  S1 and S2 heard.  Respiratory:  No rhonchi or crepitations.  Abdomen:  Soft nontender bowel sounds present. No guarding or rigidity.  Skin:  No rash.  Musculoskeletal:  No edema. Left AKA.  Psychiatric:  Appears normal.  Neurologic:  Alert awake oriented to time place and person. Moves all extremities.  Labs on Admission:  Basic Metabolic Panel:  Recent Labs Lab 07/20/14 2153 07/20/14 2206  NA 135* 138  K 4.1 3.9  CL 101 107  CO2 18*  --   GLUCOSE 246* 250*  BUN 110* 104*  CREATININE 1.21 1.30  CALCIUM 8.7  --    Liver Function Tests:  Recent Labs Lab 07/20/14 2153  AST 15  ALT 17  ALKPHOS 66  BILITOT 0.2*  PROT 5.8*  ALBUMIN 2.4*   No results for input(s): LIPASE, AMYLASE in the last 168 hours. No results for input(s): AMMONIA in the last 168 hours. CBC:  Recent Labs Lab 07/20/14 2153 07/20/14 2206  WBC 15.3*  --   HGB 10.3* 11.2*  HCT 32.5* 33.0*  MCV 84.2  --   PLT 335  --    Cardiac Enzymes: No results for  input(s): CKTOTAL, CKMB, CKMBINDEX, TROPONINI in the last 168 hours.  BNP (last 3 results)  Recent Labs  08/26/13 1130  PROBNP 918.5*   CBG: No results for  input(s): GLUCAP in the last 168 hours.  Radiological Exams on Admission: Dg Abd Acute W/chest  07/20/2014   CLINICAL DATA:  Hematemesis.  EXAM: ACUTE ABDOMEN SERIES (ABDOMEN 2 VIEW & CHEST 1 VIEW)  COMPARISON:  July 04, 2014.  FINDINGS: There is no evidence of dilated bowel loops or free intraperitoneal air. No radiopaque calculi or other significant radiographic abnormality is seen. Heart size and mediastinal contours are within normal limits. Both lungs are clear.  IMPRESSION: No evidence of bowel obstruction or ileus. No acute cardiopulmonary disease.   Electronically Signed   By: Roque Lias M.D.   On: 07/20/2014 22:47     Assessment/Plan Active Problems:   PAD (peripheral artery disease)   CAD (coronary artery disease), native coronary artery   Chronic diastolic heart failure   Hematemesis   GI bleed   Diabetes mellitus type 2, controlled  1. Acute GI bleed with hematemesis - at this time patient has been kept nothing by mouth and we will hold off patient's antiplatelet agents for now given acute GI bleed. Closely follow CBC. Patient has been placed on Protonix infusion. Type and screen. On-call gastroenterologist for [facility] was consulted and further recommendations per GI. In anticipation of EGD patient will be kept nothing by mouth. 2. Diabetes mellitus type 2 uncontrolled - patient does have mild anion gap increase. At this time will closely follow CBGs with signs scale coverage. Closely follow metabolic panel for any further worsening of bicarbonate or development of obvious DKA. 3. Leukocytosis - probably reactionary. At this time patient is afebrile. UA is pending. Chest x-ray does not show anything acute. 4. History of CHF last year measured was 55-60% 2014 - holding all diuretics for now secondary to GI bleed. 5. CAD status post CABG - denies any chest pain and since patient has acute GI bleed antiplatelet agents on hold. Continue beta blockers. If hemoglobin falls less  than 8 we will transfuse.  6. Peripheral arterial disease status post left AKA.    Code Status: Full code.  Family Communication: None.  Disposition Plan: Admit to inpatient.    Adasia Hoar N. [facility] Pager [phone].  If 7PM-7AM, please contact night-coverage [email or web address] Password [facility] 07/21/2014, 12:37 AM

## 2014-07-22 ENCOUNTER — Encounter (HOSPITAL_COMMUNITY)
Admission: EM | Disposition: A | Discharge status: Skilled Nursing Facility | Payer: Self-pay | Source: Home / Self Care | Attending: Internal Medicine

## 2014-07-22 ENCOUNTER — Inpatient Hospital Stay (HOSPITAL_COMMUNITY): Payer: Medicare Other | Admitting: Anesthesiology

## 2014-07-22 ENCOUNTER — Encounter (HOSPITAL_COMMUNITY): Payer: Self-pay | Admitting: *Deleted

## 2014-07-22 DIAGNOSIS — D62 Acute posthemorrhagic anemia: Secondary | ICD-10-CM

## 2014-07-22 HISTORY — PX: ESOPHAGOGASTRODUODENOSCOPY (EGD) WITH PROPOFOL: SHX5813

## 2014-07-22 LAB — BASIC METABOLIC PANEL
ANION GAP: 13 (ref 5–15)
BUN: 118 mg/dL — ABNORMAL HIGH (ref 6–23)
CALCIUM: 8.5 mg/dL (ref 8.4–10.5)
CO2: 22 mEq/L (ref 19–32)
Chloride: 105 mEq/L (ref 96–112)
Creatinine, Ser: 1.83 mg/dL — ABNORMAL HIGH (ref 0.50–1.35)
GFR calc non Af Amer: 35 mL/min — ABNORMAL LOW (ref >90)
GFR, EST AFRICAN AMERICAN: 41 mL/min — AB (ref >90)
GLUCOSE: 182 mg/dL — AB (ref 70–99)
Potassium: 3.6 mEq/L — ABNORMAL LOW (ref 3.7–5.3)
Sodium: 140 mEq/L (ref 137–147)

## 2014-07-22 LAB — GLUCOSE, CAPILLARY
GLUCOSE-CAPILLARY: 175 mg/dL — AB (ref 70–99)
GLUCOSE-CAPILLARY: 171 mg/dL — AB (ref 70–99)
GLUCOSE-CAPILLARY: 207 mg/dL — AB (ref 70–99)
Glucose-Capillary: 174 mg/dL — ABNORMAL HIGH (ref 70–99)
Glucose-Capillary: 177 mg/dL — ABNORMAL HIGH (ref 70–99)
Glucose-Capillary: 186 mg/dL — ABNORMAL HIGH (ref 70–99)

## 2014-07-22 LAB — CBC
HCT: 29.1 % — ABNORMAL LOW (ref 39.0–52.0)
HCT: 27.8 % — ABNORMAL LOW (ref 39.0–52.0)
HEMATOCRIT: 26.6 % — AB (ref 39.0–52.0)
Hemoglobin: 9.4 g/dL — ABNORMAL LOW (ref 13.0–17.0)
Hemoglobin: 9 g/dL — ABNORMAL LOW (ref 13.0–17.0)
Hemoglobin: 8.4 g/dL — ABNORMAL LOW (ref 13.0–17.0)
MCH: 26.9 pg (ref 26.0–34.0)
MCH: 26.2 pg (ref 26.0–34.0)
MCH: 25.8 pg — ABNORMAL LOW (ref 26.0–34.0)
MCHC: 32.3 g/dL (ref 30.0–36.0)
MCHC: 32.4 g/dL (ref 30.0–36.0)
MCHC: 31.6 g/dL (ref 30.0–36.0)
MCV: 83.1 fL (ref 78.0–100.0)
MCV: 81 fL (ref 78.0–100.0)
MCV: 81.8 fL (ref 78.0–100.0)
PLATELETS: 270 10*3/uL (ref 150–400)
Platelets: 283 10*3/uL (ref 150–400)
Platelets: 260 10*3/uL (ref 150–400)
RBC: 3.5 MIL/uL — ABNORMAL LOW (ref 4.22–5.81)
RBC: 3.43 MIL/uL — ABNORMAL LOW (ref 4.22–5.81)
RBC: 3.25 MIL/uL — AB (ref 4.22–5.81)
RDW: 16.8 % — AB (ref 11.5–15.5)
RDW: 17.1 % — AB (ref 11.5–15.5)
RDW: 17.6 % — ABNORMAL HIGH (ref 11.5–15.5)
WBC: 13.7 10*3/uL — ABNORMAL HIGH (ref 4.0–10.5)
WBC: 12.4 10*3/uL — ABNORMAL HIGH (ref 4.0–10.5)
WBC: 10.9 10*3/uL — AB (ref 4.0–10.5)

## 2014-07-22 SURGERY — ESOPHAGOGASTRODUODENOSCOPY (EGD) WITH PROPOFOL
Anesthesia: Monitor Anesthesia Care

## 2014-07-22 MED ORDER — FENTANYL CITRATE 0.05 MG/ML IJ SOLN: 25.0000 ug | INTRAMUSCULAR | Status: DC | PRN

## 2014-07-22 MED ORDER — SODIUM CHLORIDE 0.9 % IV SOLN: INTRAVENOUS | Status: DC

## 2014-07-22 MED ORDER — PRO-STAT SUGAR FREE PO LIQD
30.0000 mL | Freq: Two times a day (BID) | ORAL | Status: DC
  Administered 2014-07-22 – 2014-07-25 (×7): 30 mL via ORAL
  Filled 2014-07-22 (×7): qty 30

## 2014-07-22 MED ORDER — GLUCERNA SHAKE PO LIQD
237.0000 mL | Freq: Three times a day (TID) | ORAL | Status: DC
  Administered 2014-07-22 – 2014-07-23 (×3): 237 mL via ORAL

## 2014-07-22 MED ORDER — SUCRALFATE 1 GM/10ML PO SUSP
1.0000 g | Freq: Three times a day (TID) | ORAL | Status: DC
  Administered 2014-07-22 – 2014-07-25 (×13): 1 g via ORAL
  Filled 2014-07-22 (×17): qty 10

## 2014-07-22 MED ORDER — LACTATED RINGERS IV SOLN
INTRAVENOUS | Status: DC
  Administered 2014-07-22: 1000 mL via INTRAVENOUS

## 2014-07-22 MED ORDER — PROPOFOL 10 MG/ML IV BOLUS
INTRAVENOUS | Status: DC | PRN
  Administered 2014-07-22: 10 mg via INTRAVENOUS

## 2014-07-22 MED ORDER — LACTATED RINGERS IV SOLN
INTRAVENOUS | Status: DC | PRN
  Administered 2014-07-22: 10:00:00 via INTRAVENOUS

## 2014-07-22 MED ORDER — LIDOCAINE HCL (CARDIAC) 20 MG/ML IV SOLN
INTRAVENOUS | Status: DC | PRN
  Administered 2014-07-22: 50 mg via INTRAVENOUS

## 2014-07-22 MED ORDER — PROPOFOL INFUSION 10 MG/ML OPTIME
INTRAVENOUS | Status: DC | PRN
  Administered 2014-07-22: 50 ug/kg/min via INTRAVENOUS

## 2014-07-22 NOTE — Progress Notes (Signed)
NUTRITION FOLLOW UP  DOCUMENTATION CODES Per approved criteria  -Obesity Unspecified   INTERVENTION: Provide Glucerna Shake po TID, each supplement provides 220 kcal and 10 grams of protein.  Provide 30 ml Prostat po BID, each supplement provides 100 kcal and 15 grams of protein.  Encourage adequate PO intake.  NUTRITION DIAGNOSIS: Increased nutrient needs related to wound healing as evidenced by estimated nutrition needs; ongoing  Goal: Pt to meet >/= 90% of their estimated nutrition needs; progressing  Monitor:  PO intake, weight trends, labs, I/O's  73 y.o. male  Admitting Dx: Hematemesis  ASSESSMENT: Pt with history of CAD status post CABG, diabetes mellitus, CHF, hypertension, peripheral vascular disease who was recently admitted for encephalopathy and at that time was attributed to psychosis and UTI presents to the ER from the nursing home after patient was found to have one episode of hematemesis.   Procedure 12/18: Upper Endoscopy  Pt's EGD showed a nonbleeding  prepyloric ulcer. Pt has been advanced to a full liquid diet. Plan is to advance his diet slowly. RD to order Glucerna Shake and Prostat to help aid in wound healing. Encourage adequate PO intake.    Labs: Low potassium and GFR. High BUN and creatinine.  Height: Ht Readings from Last 1 Encounters:  07/21/14 5\' 10"  (1.778 m)    Weight: Wt Readings from Last 1 Encounters:  07/21/14 222 lb 10.6 oz (101 kg)  07/04/14 236 lbs  BMI:  Body mass index is 31.95 kg/(m^2). 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, +2 LE edema  Diet Order: Diet full liquid   Intake/Output Summary (Last 24 hours) at 07/22/14 1416 Last data filed at 07/22/14 1023  Gross per 24 hour  Intake 1206.25 ml  Output      0 ml  Net 1206.25 ml    Last BM: 12/17  Labs:   Recent Labs Lab 07/20/14 2153  07/20/14 2206 07/21/14 0724 07/22/14 0625  NA 135* 138 140 140  K 4.1 3.9 5.0 3.6*  CL 101 107 104 105  CO2 18*  --  16* 22  BUN 110* 104* 129* 118*  CREATININE 1.21 1.30 1.55* 1.83*  CALCIUM 8.7  --  8.8 8.5  GLUCOSE 246* 250* 336* 182*    CBG (last 3)   Recent Labs  07/22/14 0410 07/22/14 0754 07/22/14 1202  GLUCAP 175* 174* 171*    Scheduled Meds: . sodium chloride   Intravenous Once  . sodium chloride   Intravenous Once  . brimonidine  1 drop Both Eyes BID   And  . timolol  1 drop Both Eyes BID  . collagenase   Topical Daily  . insulin aspart  0-9 Units Subcutaneous 6 times per day  . insulin glargine  10 Units Subcutaneous Daily  . latanoprost  1 drop Both Eyes QHS  . [START ON 07/24/2014] pantoprazole (PROTONIX) IV  40 mg Intravenous Q12H  . sodium chloride  3 mL Intravenous Q12H  . sucralfate  1 g Oral TID WC & HS    Continuous Infusions: . sodium chloride    . sodium chloride 1,000 mL (07/22/14 0836)  . pantoprozole (PROTONIX) infusion 8 mg/hr (07/21/14 2306)    Past Medical History  Diagnosis Date  . Hypertension   . Peripheral vascular disease   . Ulcer     left foot  . Dyslipidemia   . Chronic venous insufficiency   .  Sleep apnea   . Morbid obesity   . Diverticulosis   . Benign prostatic hypertrophy   . Glaucoma   . Gout   . Depression   . Insomnia   . CAD (coronary artery disease), native coronary artery   . GERD (gastroesophageal reflux disease)   . Angina   . Pneumonia 12/11/11  . Type II diabetes mellitus   . Chronic daily headache     "~ all the time"  . Seizures   . Stroke ~ 1983    "little weaker right hand"  . MRSA (methicillin resistant staph aureus) culture positive Dec. 17, 2013     right heel infection    Past Surgical History  Procedure Laterality Date  . Appendectomy    . Lumbar laminectomy    . Mastoid debridement    . Knee and ankle surgery  1979    "caved in in a ditch; messed up legs"; right  .  Cholecystectomy    . Back surgery      "5 or 6"  . Peripheral arterial stent graft      "don't remember which side"  . Coronary artery bypass graft  1983; 1992    "CABG X 5; CABG X 8"  . Lower extremity angiogram  01/21/2012    Procedure: LOWER EXTREMITY ANGIOGRAM;  Surgeon: Nada LibmanVance W Brabham, MD;  [facility];  Service: Vascular;  Laterality: Bilateral;  WITH POSSIBLE INTERVENTION  . Eye surgery      "laser; both eyes"  . Eye surgery  05/08/2012    Cataract Left eye  . Heel bx  Dec. 17, 2013    Right Heel Bx.    . Transluminal atherectomy popliteal artery  01/21/2012    Left  . Amputation Left 08/27/2013    Procedure: LEFT ABOVE THE KNEE AMPUTATION ;  Surgeon: Pryor OchoaJames D Lawson, MD;  [facility];  Service: Vascular;  Laterality: Left;  . Abdominal aortagram N/A 06/02/2012    Procedure: ABDOMINAL AORTAGRAM;  Surgeon: Nada LibmanVance W Brabham, MD;  [facility];  Service: Cardiovascular;  Laterality: N/A;    Marijean NiemannStephanie La, MS, RD, LDN Pager # [phone] After hours/ weekend pager # [phone]

## 2014-07-22 NOTE — Progress Notes (Signed)
Physical Therapy Wound Treatment Patient Details  Name: Nicholas [name] MRN: [id number] Date of Birth: 1941-02-19  Today's Date: 07/22/2014 Time: 1027-2536 Time Calculation (min): 47 min  Subjective  Subjective: It hurts are you thrugh Patient and Family Stated Goals: get it healed up Prior Treatments: chronic wound  Pain Score: Pain Score: 7   Wound Assessment  Pressure Ulcer 07/29/11 Unstageable - Full thickness tissue loss in which the base of the ulcer is covered by slough (yellow, tan, gray, green or brown) and/or eschar (tan, brown or black) in the wound bed. left foot (Active)     Pressure Ulcer 07/29/11 Unstageable - Full thickness tissue loss in which the base of the ulcer is covered by slough (yellow, tan, gray, green or brown) and/or eschar (tan, brown or black) in the wound bed. (Active)     Pressure Ulcer Stage IV - Full thickness tissue loss with exposed bone, tendon or muscle. (Active)     Pressure Ulcer 08/25/13 Stage IV - Full thickness tissue loss with exposed bone, tendon or muscle. non healing pressure ulcer,  came w/ stage 4 per WOC (Active)  Dressing Type Foam 07/21/2014  8:30 PM  Dressing Dry;Intact 07/21/2014  8:30 PM  Site / Wound Assessment Yellow;Pale;Red 07/21/2014  1:45 AM  % Wound base Red or Granulating 30% 07/21/2014  1:45 AM  % Wound base Yellow 50% 07/21/2014  1:45 AM  % Wound base Other (Comment) 20% 07/21/2014  1:45 AM  Peri-wound Assessment Excoriated;Other (Comment) 07/21/2014  1:45 AM  Wound Length (cm) 9 cm 07/21/2014  1:45 AM  Wound Width (cm) 6 cm 07/21/2014  1:45 AM  Wound Depth (cm) 3.3 cm 07/21/2014  1:45 AM  Tunneling (cm) 0 07/21/2014  1:45 AM  Undermining (cm) 0.6 07/21/2014  1:45 AM  Margins Unattached edges (unapproximated) 07/21/2014  1:45 AM  Drainage Amount Moderate 07/21/2014  1:45 AM  Drainage Description Serosanguineous 07/21/2014  1:45 AM  Treatment Other (Comment) 07/20/2014 10:09 PM     Pressure Ulcer 07/21/14 Stage  III -  Full thickness tissue loss. Subcutaneous fat may be visible but bone, tendon or muscle are NOT exposed. Chronic sacral wound (Active)  Dressing Type Foam;Hydrogel;Moist to dry 07/22/2014  2:00 PM  Dressing Dry;Intact 07/22/2014  2:00 PM  State of Healing Eschar 07/22/2014  2:00 PM  Site / Wound Assessment Clean;Yellow;Red;Pale 07/22/2014  2:00 PM  % Wound base Red or Granulating 30% 07/22/2014  2:00 PM  % Wound base Yellow 70% 07/22/2014  2:00 PM  % Wound base Black 0% 07/21/2014 11:00 AM  % Wound base Other (Comment) 0% 07/21/2014 11:00 AM  Peri-wound Assessment Excoriated 07/22/2014  2:00 PM  Wound Length (cm) 2.6 cm 07/21/2014 11:00 AM  Wound Width (cm) 6.2 cm 07/21/2014 11:00 AM  Wound Depth (cm) 2.2 cm 07/21/2014 11:00 AM  Drainage Amount Minimal 07/22/2014  2:00 PM  Drainage Description Serous 07/22/2014  2:00 PM  Treatment Cleansed 07/22/2014  2:00 PM     Wound 08/28/11 Other (Comment) Sacrum pressure ulcer , stage 4, full thickness loss with exposedbone, tendon and muscle  (Active)     Wound 08/28/11 Other (Comment) Foot Pressure ulcer on bottom of left foot, partial thickness loss, exudate evident. Approximately size of 1/2 dollar and approx. 1/4 inch deep (Active)     Wound 12/11/11 Diabetic ulcer Ankle Left (Active)     Incision 08/27/13 Thigh Left (Active)   Hydrotherapy Ultrasonic mist  - wound location: sacrum Ultrasonic mist at 35KHz (+/-3KHz) at ___ percent:  100 % Ultrasonic mist therapy minutes: 7 min Selective Debridement Selective Debridement - Location: sacral Selective Debridement - Tools Used: Scalpel;Forceps Selective Debridement - Tissue Removed: fibrin, eschar   Wound Assessment and Plan  Wound Therapy - Assess/Plan/Recommendations Wound Therapy - Clinical Statement: pt can benefit from [place] mist to kill bacterial and make the wound acute again to help the body heal itself with help of caregivers Wound Therapy - Functional Problem List: decr  mobility, decreased sensation Factors Delaying/Impairing Wound Healing: Immobility;Altered sensation Hydrotherapy Plan: Debridement;Patient/family education;Ultrasonic wound therapy _0  KHz (+/- 3 KHz) Wound Therapy - Frequency: 6X / week Wound Therapy - Current Recommendations: PT Wound Therapy - Follow Up Recommendations: Skilled nursing facility;Wound Care Center Wound Plan: see above  Wound Therapy Goals- Improve the function of patient's integumentary system by progressing the wound(s) through the phases of wound healing (inflammation - proliferation - remodeling) by: Decrease Necrotic Tissue to: 30 Decrease Necrotic Tissue - Progress: Progressing toward goal Increase Granulation Tissue to: 70 Increase Granulation Tissue - Progress: Progressing toward goal  Goals will be updated until maximal potential achieved or discharge criteria met.  Discharge criteria: when goals achieved, discharge from hospital, MD decision/surgical intervention, no progress towards goals, refusal/missing three consecutive treatments without notification or medical reason.  GP     Nolawi Kanady, Tessie Fass 07/22/2014, 2:51 PM

## 2014-07-22 NOTE — Anesthesia Preprocedure Evaluation (Addendum)
Anesthesia Evaluation  Patient identified by MRN, date of birth, ID band Patient awake    Reviewed: Allergy & Precautions, H&P , NPO status , Patient's Chart, lab work & pertinent test results, reviewed documented beta blocker date and time   Airway Mallampati: II  TM Distance: >3 FB Neck ROM: Full    Dental no notable dental hx. (+) Teeth Intact, Dental Advisory Given   Pulmonary sleep apnea , former smoker,  breath sounds clear to auscultation  Pulmonary exam normal       Cardiovascular hypertension, Pt. on medications and Pt. on home beta blockers + CAD and + Peripheral Vascular Disease Rhythm:Regular Rate:Normal     Neuro/Psych  Headaches, Seizures -, Well Controlled,  Anxiety Depression CVA    GI/Hepatic Neg liver ROS, GERD-  Medicated and Controlled,  Endo/Other  diabetes, Type 1, Insulin Dependent  Renal/GU Renal InsufficiencyRenal disease  negative genitourinary   Musculoskeletal  (+) Arthritis -,   Abdominal   Peds  Hematology negative hematology ROS (+)   Anesthesia Other Findings   Reproductive/Obstetrics negative OB ROS                            Anesthesia Physical Anesthesia Plan  ASA: III  Anesthesia Plan: MAC   Post-op Pain Management:    Induction: Intravenous  Airway Management Planned: Nasal Cannula  Additional Equipment:   Intra-op Plan:   Post-operative Plan:   Informed Consent: I have reviewed the patients History and Physical, chart, labs and discussed the procedure including the risks, benefits and alternatives for the proposed anesthesia with the patient or authorized representative who has indicated his/her understanding and acceptance.   Dental advisory given  Plan Discussed with: CRNA and Surgeon  Anesthesia Plan Comments:         Anesthesia Quick Evaluation

## 2014-07-22 NOTE — Op Note (Signed)
[facility] [address] [place], [id number]   ENDOSCOPY PROCEDURE REPORT  PATIENT: Nicholas [name], Nicholas D  MR#: [id number] BIRTHDATE: [birth year] , 73  yrs. old GENDER: male ENDOSCOPIST:Hoy Fallert, MD REFERRED BY: Dr. Robley FriesNevill Gates PROCEDURE DATE:  07/22/2014 PROCEDURE:   upper endoscopy with biopsies ASA CLASS:    III INDICATIONS: reported hematemesis, melenic stool, drop in hemoglobin, elevated BUN, in a patient on aspirin and Plavix but, who interestingly was also reportedly on pantoprazole at the rehabilitation facility MEDICATION: Mac TOPICAL ANESTHETIC:   Cetacaine spray?  DESCRIPTION OF PROCEDURE:   After the risks and benefits of the procedure were explained, informed consent was obtained.  The Pentax Gastroscope [id number]  endoscope was introduced through the mouth  and advanced to the second portion of the duodenum .  The instrument was slowly withdrawn as the mucosa was fully examined.    [facility] Written consent provided by patient's wife since the patient has cognitive deficits. Timeout performed. Mac anesthesia well tolerated.  Pentax adult video endoscope passed under direct vision. Larynx briefly seen and appeared grossly normal. Esophagus entered without significant difficulty, and was normal in its entirety.  Stomach entered. No residual, blood, or coffee ground material present.  Large (1.5 x 3 cm) benign-appearing ulcer present in the prepyloric region. Clean base, no stigmata of hemorrhage. Superficial ulceration present nearby. Remainder of stomach normal.  Pylorus, duodenal bulb, and second duodenum normal.  Antral biopsies obtained for pathology, to evaluate for possible Helicobacter pylori infection.         retroflexion normal. The scope was then withdrawn from the patient and the procedure completed.  COMPLICATIONS: There were no immediate  complications.  ENDOSCOPIC IMPRESSION: 1. No active bleeding or blood in stomach at time of procedure 2. Prepyloric ulcer, which is the presumed source of the patient's bleeding. It is somewhat unusual that this would occur while the patient was on pantoprazole at the nursing home prior to admission, even though he was on aspirin  RECOMMENDATIONS: 1. Intensive antipeptic therapy with twice-daily pantoprazole indefinitely, since the patient apparently failed once-daily pantoprazole 2. Sucralfate 4 times a day as long as the patient is in the hospital. I don't think he will need it after discharge 3. As usual, a decision has to be made whether it is safer for this patient to be off aspirin in the future. At the least, it would be nice for him to be off it for the next 4 weeks, to allow the ulcer to heal. Thereafter, if it is felt to be critical in this patient who has a prior history of CVA, I think it would be reasonable to use it, as long as he remains on high-dose PPI therapy 4. Await pathology results. I would treat Helicobacter pylori infection if it is present 5. Ordinarily, follow-up endoscopy is performed in patients who have gastric ulcers. However, ulcers that are immediately prepyloric like this one are almost always benign, plus the patient has a logical reason for an ulcer (aspirin therapy), plus the ulcer has a benign appearance. Taking all these factors into consideration, as well as the patient's overall very poor medical condition and relatively high risk for sedation and endoscopic procedures, I do not think that routine follow-up endoscopy is prudent in this particular individual. However, we could reconsider that decision if the patient has ongoing problems going forward, such as persistent anemia or heme positivity, recurrent bleeding, poor food intake,  etc.   _______________________________ eSignedBernette Redbird:  Chanay Nugent, MD 07/22/2014 10:38 AM     cc:  CPT  CODES: ICD CODES:  The ICD and CPT codes recommended by this software are interpretations from the data that the clinical staff has captured with the software.  The verification of the translation of this report to the ICD and CPT codes and modifiers is the sole responsibility of the health care institution and practicing physician where this report was generated.  [employer] will not be held responsible for the validity of the ICD and CPT codes included on this report.  AMA assumes no liability for data contained or not contained herein. CPT is a Publishing rights managerregistered trademark of the [employer].  PATIENT NAME:  Nicholas [name], Nicholas D MR#: [id number]

## 2014-07-22 NOTE — Progress Notes (Signed)
Patient's EGD shows a nonbleeding but rather impressive prepyloric ulcer.  Please see dictated procedure report for findings and recommendations.  I will order a full liquid diet on this patient. He does not feel very much like eating, and given the location of this ulcer, it may be that his diet will have to be advanced rather slowly.  I am also starting the patient on sucralfate, in addition to the pantoprazole which he is already on.  We will sign off at this time, since the patient appears to be at low risk for further bleeding. Again, please see procedure report for recommendations. Please call us if further questions arise or if we can be of further assistance in the care of this patient.  Florencia Reasonsobert V. Yahayra Geis, M.D. [phone]

## 2014-07-22 NOTE — Progress Notes (Signed)
PATIENT DETAILS Name: Sherilyn CooterJames D Salle Age: 73 y.o. Sex: male Date of Birth: [birth year] Admit Date: 07/20/2014 Admitting Physician Eduard ClosArshad N Kakrakandy, MD ZOX:[id number]:GATES,ROBERT NEVILL, MD  Subjective: Only a smear of melena overnight. No major complaints.  Assessment/Plan: Active Problems:   Upper GI bleed: Presented with hematemesis and melena, GI consulted, EGD on 12/18 shows a known bleeding prepyloric ulcer. Await biopsy to see if H. pylori, continue with PPI and Carafate. Bleeding seems to have slowed down/resolved clinically. Follow, thankfully remains hemodynamically stable.     Acute blood loss anemia: Secondary to above. Transfused 2 units of PRBC, continue to monitor CBC periodically. If significant drop in hemoglobin we'll transfuse PRBC.    Type 2 diabetes:c/w Lantus 10 units, c/w SSI, follow CBG's.    HTN: BP currently controlled without use of antihypertensives-hold atenolol, Imdur-will resume when able.    Acute renal failure: Likely secondary to GI bleed. Continue IV fluids, recheck electrolytes in a.m.    Leukocytosis: no fever, no PNA on CXR, UA pending. Monitor off Abx,looks non toxic.Blood culture.Follow closely, WBC now decreasing.    Chronic diastolic heart failure: Clinically compensated. Watch closely while on IV fluids.    Hx of CAD-S/P CABG:assymptomatic-anti-platelets currently on hold given GI bleed    Hx if CVA: non focal exam-anti-platelets currently on hold given GI bleed    Suspected Dementia:per family he has developed intermittent periods of confusion over the past few months, they give hx of sundowning. Suspect ? Vascular dementia.Currently very pleasant, and cooperative.    Hx of PAD-s/p Left BKA    Stage IV Pressure Ulcer:present on admission.Appreciate wound care eval  Disposition: Remain inpatient  Antibiotics:  None  Anti-infectives    None      DVT Prophylaxis:  SCD's  Code Status:  DNR-reconfirmed with  family  Family Communication Son/spouse at bedside  Procedures:  None  CONSULTS:  GI  Time spent 40 minutes-which includes 50% of the time with face-to-face with patient/ family and coordinating care related to the above assessment and plan.  MEDICATIONS: Scheduled Meds: . sodium chloride   Intravenous Once  . sodium chloride   Intravenous Once  . brimonidine  1 drop Both Eyes BID   And  . timolol  1 drop Both Eyes BID  . collagenase   Topical Daily  . insulin aspart  0-9 Units Subcutaneous 6 times per day  . insulin glargine  10 Units Subcutaneous Daily  . latanoprost  1 drop Both Eyes QHS  . [START ON 07/24/2014] pantoprazole (PROTONIX) IV  40 mg Intravenous Q12H  . sodium chloride  3 mL Intravenous Q12H  . sucralfate  1 g Oral TID WC & HS   Continuous Infusions: . sodium chloride    . sodium chloride 1,000 mL (07/22/14 0836)  . pantoprozole (PROTONIX) infusion 8 mg/hr (07/21/14 2306)   PRN Meds:.acetaminophen **OR** acetaminophen, morphine injection, nitroGLYCERIN, ondansetron **OR** ondansetron (ZOFRAN) IV    PHYSICAL EXAM: Vital signs in last 24 hours: Filed Vitals:   07/22/14 1029 07/22/14 1030 07/22/14 1040 07/22/14 1050  BP: 105/42 105/42 130/45   Pulse: 68 68    Temp:  98.5 F (36.9 C)  98.4 F (36.9 C)  TempSrc:  Oral  Oral  Resp: 19 23 20    Height:      Weight:      SpO2: 100% 100% 100%     Weight change:  Filed Weights   07/21/14 0200  Weight: 101 kg (222  lb 10.6 oz)   Body mass index is 31.95 kg/(m^2).   Gen Exam: Awake and alert with clear speech.   Neck: Supple, No JVD.   Chest: B/L Clear.   CVS: S1 S2 Regular, no murmurs.  Abdomen: soft, BS +, non tender, non distended.  Extremities: no edema Left BKA Neurologic: Non Focal.   Skin: No Rash.  Wounds: N/A.    Intake/Output from previous day:  Intake/Output Summary (Last 24 hours) at 07/22/14 1115 Last data filed at 07/22/14 1023  Gross per 24 hour  Intake 1206.25 ml  Output       0 ml  Net 1206.25 ml     LAB RESULTS: CBC  Recent Labs Lab 07/21/14 0255 07/21/14 0950 07/21/14 1400 07/22/14 0050 07/22/14 0625  WBC 16.4* 17.7* 17.5* 13.7* 12.4*  HGB 9.6* 9.4* 8.3* 9.4* 9.0*  HCT 30.0* 29.2* 26.0* 29.1* 27.8*  PLT 339 362 362 270 283  MCV 83.8 84.1 81.8 83.1 81.0  MCH 26.8 27.1 26.1 26.9 26.2  MCHC 32.0 32.2 31.9 32.3 32.4  RDW 17.0* 17.5* 17.4* 16.8* 17.1*    Chemistries   Recent Labs Lab 07/20/14 2153 07/20/14 2206 07/21/14 0724 07/22/14 0625  NA 135* 138 140 140  K 4.1 3.9 5.0 3.6*  CL 101 107 104 105  CO2 18*  --  16* 22  GLUCOSE 246* 250* 336* 182*  BUN 110* 104* 129* 118*  CREATININE 1.21 1.30 1.55* 1.83*  CALCIUM 8.7  --  8.8 8.5    CBG:  Recent Labs Lab 07/21/14 1634 07/21/14 2015 07/22/14 0008 07/22/14 0410 07/22/14 0754  GLUCAP 250* 213* 186* 175* 174*    GFR Estimated Creatinine Clearance: 43.5 mL/min (by C-G formula based on Cr of 1.83).  Coagulation profile  Recent Labs Lab 07/20/14 2153  INR 1.21    Cardiac Enzymes  Recent Labs Lab 07/21/14 0255  TROPONINI <0.30    Invalid input(s): POCBNP No results for input(s): DDIMER in the last 72 hours. No results for input(s): HGBA1C in the last 72 hours. No results for input(s): CHOL, HDL, LDLCALC, TRIG, CHOLHDL, LDLDIRECT in the last 72 hours. No results for input(s): TSH, T4TOTAL, T3FREE, THYROIDAB in the last 72 hours.  Invalid input(s): FREET3 No results for input(s): VITAMINB12, FOLATE, FERRITIN, TIBC, IRON, RETICCTPCT in the last 72 hours.  Recent Labs  07/21/14 0255  LIPASE 53    Urine Studies No results for input(s): UHGB, CRYS in the last 72 hours.  Invalid input(s): UACOL, UAPR, USPG, UPH, UTP, UGL, UKET, UBIL, UNIT, UROB, ULEU, UEPI, UWBC, URBC, UBAC, CAST, UCOM, BILUA  MICROBIOLOGY: Recent Results (from the past 240 hour(s))  Culture, blood (routine x 2)     Status: None (Preliminary result)   Collection Time: 07/21/14  3:05 PM   Result Value Ref Range Status   Specimen Description BLOOD RIGHT ARM  Final   Special Requests BOTTLES DRAWN AEROBIC ONLY 6CC  Final   Culture  Setup Time   Final    07/21/2014 21:40 Performed at [facility]    Culture   Final           BLOOD CULTURE RECEIVED NO GROWTH TO DATE CULTURE WILL BE HELD FOR 5 DAYS BEFORE ISSUING A FINAL NEGATIVE REPORT Performed at [facility]    Report Status PENDING  Incomplete  Culture, blood (routine x 2)     Status: None (Preliminary result)   Collection Time: 07/21/14  3:05 PM  Result Value Ref Range Status  Specimen Description BLOOD RIGHT HAND  Final   Special Requests BOTTLES DRAWN AEROBIC ONLY 3CC  Final   Culture  Setup Time   Final    07/21/2014 21:42 [facility]formed [facility]    Culture   Final           BLOOD CULTURE RECEIVED NO GROWTH TO DATE CULTURE WILL BE HELD FOR 5 DAYS BEFORE ISSUING A FINAL NEGATIVE REPORT Performed at [facility]    Report Status PENDING  Incomplete    RADIOLOGY STUDIES/RESULTS: Dg Chest 2 View  07/04/2014   CLINICAL DATA:  Acute encephalopathy.  Weakness.  EXAM: CHEST  2 VIEW  COMPARISON:  08/26/2013  FINDINGS: Mild enlargement of the heart is unchanged. 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 Lung markings are mildly prominent but unchanged. No focal airspace disease. No significant pulmonary edema. Trachea is midline. Mild blunting at the right costophrenic angle appears chronic. Degenerative changes in the thoracic spine without large pleural effusions. Small retrocardiac density is compatible with a hiatal hernia.a  IMPRESSION: No acute chest findings.   Electronically Signed   By: Richarda OverlieAdam  Henn M.D.   On: 07/04/2014 15:48   Ct Head Wo Contrast  07/03/2014   CLINICAL DATA:  Altered mental status  EXAM: CT HEAD WITHOUT CONTRAST  TECHNIQUE: Contiguous axial images were obtained from the base of the skull through the vertex without intravenous contrast.  COMPARISON:  CT head  05/30/2013  FINDINGS: Age-appropriate atrophy.  Negative for hydrocephalus  Mild chronic microvascular ischemic changes in the white matter. Negative for acute infarct. Negative for hemorrhage or mass  Prior mastoidectomy on the right. Left mastoid sinus effusion and middle ear effusion no acute bony abnormality.  IMPRESSION: Mild atrophy and mild chronic microvascular ischemic change in the white matter. No acute abnormality.  Left middle ear and mastoid sinus effusion.   Electronically Signed   By: Marlan Palauharles  Clark M.D.   On: 07/03/2014 13:38   Mr Brain Wo Contrast  07/04/2014   CLINICAL DATA:  Altered mental status and agitation. Hallucinations.  EXAM: MRI HEAD WITHOUT CONTRAST  TECHNIQUE: Multiplanar, multiecho pulse sequences of the brain and surrounding structures were obtained without intravenous contrast.  COMPARISON:  CT head without contrast 07/03/2014.  FINDINGS: The study is mildly degraded by patient motion. No acute infarct, hemorrhage, or mass lesion is present. Mild generalized atrophy and periventricular white matter changes are noted bilaterally. There are remote lacunar infarcts involving the right caudate head and anterior limb internal capsule. The ventricles are proportionate to the degree of atrophy. Flow is present in the major intracranial arteries. Midline images demonstrate degenerative changes in the upper cervical spine.  Paranasal sinuses are clear. Minimal fluid is present in the inferior right mastoid air cells. No obstructing nasopharyngeal lesion is evident. There is fluid in the left mastoid air cells and middle ear cavity. No obstructing nasopharyngeal lesion is evident.  IMPRESSION: 1. Mild atrophy and white matter disease likely reflects the sequela of chronic microvascular ischemia. 2. Remote lacunar infarcts of the right basal ganglia. 3. No acute intracranial abnormality. 4. Degenerative changes of the upper cervical spine. 5. Left greater right mastoid effusions. No  obstructing nasopharyngeal lesion is evident.   Electronically Signed   By: Gennette Pachris  Mattern M.D.   On: 07/04/2014 19:07   Dg Abd Acute W/chest  07/20/2014   CLINICAL DATA:  Hematemesis.  EXAM: ACUTE ABDOMEN SERIES (ABDOMEN 2 VIEW & CHEST 1 VIEW)  COMPARISON:  July 04, 2014.  FINDINGS: There  is no evidence of dilated bowel loops or free intraperitoneal air. No radiopaque calculi or other significant radiographic abnormality is seen. Heart size and mediastinal contours are within normal limits. Both lungs are clear.  IMPRESSION: No evidence of bowel obstruction or ileus. No acute cardiopulmonary disease.   Electronically Signed   By: Roque Lias M.D.   On: 07/20/2014 22:47    Jeoffrey Massed, MD  [facility] Pager:[phone]  If 7PM-7AM, please contact night-coverage [email or web address] Password [email or web address] 07/22/2014, 11:15 AM   LOS: 2 days

## 2014-07-22 NOTE — Care Management Note (Addendum)
CARE MANAGEMENT NOTE 07/22/2014  Patient:  Nicholas [name], Nicholas [name]   Account Number:  [id number]  Date Initiated:  07/22/2014  Documentation initiated by:  Millette Halberstam  Subjective/Objective Assessment:   CM following for progression and d/c planning.     Action/Plan:   07/22/2014 Met with pt and family, plan is to return to SNF, Onset.   Anticipated DC Date:  07/25/2014   Anticipated DC Plan:  SKILLED NURSING FACILITY         Choice offered to / List presented to:             Status of service:  Completed, signed off Medicare Important Message given?  YES (If response is "NO", the following Medicare IM given date fields will be blank) Date Medicare IM given:  07/22/2014 Medicare IM given by:  Yvonne Petite Date Additional Medicare IM given:   Additional Medicare IM given by:    Discharge Disposition:  [place]  Per UR Regulation:    If discussed at Long Length of Stay Meetings, dates discussed:    Comments:

## 2014-07-22 NOTE — Anesthesia Postprocedure Evaluation (Signed)
  Anesthesia Post-op Note  Patient: Nicholas [name]  Procedure(s) Performed: Procedure(s): ESOPHAGOGASTRODUODENOSCOPY (EGD) WITH PROPOFOL (N/A)  Patient Location: PACU  Anesthesia Type: MAC  Level of Consciousness: awake and alert   Airway and Oxygen Therapy: Patient Spontanous Breathing  Post-op Pain: mild  Post-op Assessment: Post-op Vital signs reviewed, Patient's Cardiovascular Status Stable and Respiratory Function Stable  Post-op Vital Signs: Reviewed  Filed Vitals:   07/22/14 1050  BP:   Pulse:   Temp: 36.9 C  Resp:     Complications: No apparent anesthesia complications

## 2014-07-22 NOTE — Progress Notes (Signed)
PT Cancellation Note  Patient Details Name: Nicholas [name] MRN: [id number] DOB: 1940/09/20   Cancelled Treatment:    Reason Eval/Treat Not Completed: Patient at procedure or test/unavailable;Pain limiting ability to participate.  Pt in EGD this am and then too painful after hydrotherapy.  Will see as able 12/18.   Makailyn Mccormick, Eliseo GumKenneth V 07/22/2014, 4:31 PM

## 2014-07-22 NOTE — Progress Notes (Signed)
Inpatient Diabetes Program Recommendations  AACE/ADA: New Consensus Statement on Inpatient Glycemic Control (2013)  Target Ranges:  Prepandial:   less than 140 mg/dL      Peak postprandial:   less than 180 mg/dL (1-2 hours)      Critically ill patients:  140 - 180 mg/dL   Results for Nicholas [name], Nicholas [name] (MRN [id number]) as of 07/22/2014 12:49  Ref. Range 07/21/2014 20:15 07/22/2014 00:08 07/22/2014 04:10 07/22/2014 07:54 07/22/2014 12:02  Glucose-Capillary Latest Range: 70-99 mg/dL 409213 (H) 811186 (H) 914175 (H) 174 (H) 171 (H)    Reason for assessment: elevated CBG  Diabetes history: Type 2 Outpatient Diabetes medications: Novolog 0-9 units tid with meals Current orders for Inpatient glycemic control: Novolog 0-9 units q4h, Lantus 10 units daily.   CBG improving. Continue to monitor- no recommendations at this time.   Susette RacerJulie Bubba Vanbenschoten, RN, BA, MHA, CDE Diabetes Coordinator Inpatient Diabetes Program  [phone] (Team Pager) [phone] Patrcia Dolly([place] Office) 07/22/2014 12:53 PM

## 2014-07-22 NOTE — Transfer of Care (Signed)
Immediate Anesthesia Transfer of Care Note  Patient: Nicholas SkyeJames D Linch  Procedure(s) Performed: Procedure(s): ESOPHAGOGASTRODUODENOSCOPY (EGD) WITH PROPOFOL (N/A)  Patient Location: PACU and Endoscopy Unit  Anesthesia Type:MAC  Level of Consciousness: alert , patient cooperative and responds to stimulation  Airway & Oxygen Therapy: Patient Spontanous Breathing and Patient connected to nasal cannula oxygen  Post-op Assessment: Report given to PACU RN and Post -op Vital signs reviewed and stable  Post vital signs: Reviewed and stable  Complications: No apparent anesthesia complications

## 2014-07-23 DIAGNOSIS — N179 Acute kidney failure, unspecified: Secondary | ICD-10-CM

## 2014-07-23 LAB — BASIC METABOLIC PANEL
ANION GAP: 9 (ref 5–15)
BUN: 63 mg/dL — AB (ref 6–23)
CHLORIDE: 110 meq/L (ref 96–112)
CO2: 23 mEq/L (ref 19–32)
Calcium: 8 mg/dL — ABNORMAL LOW (ref 8.4–10.5)
Creatinine, Ser: 1.16 mg/dL (ref 0.50–1.35)
GFR, EST AFRICAN AMERICAN: 71 mL/min — AB (ref >90)
GFR, EST NON AFRICAN AMERICAN: 61 mL/min — AB (ref >90)
Glucose, Bld: 134 mg/dL — ABNORMAL HIGH (ref 70–99)
POTASSIUM: 3.4 meq/L — AB (ref 3.7–5.3)
SODIUM: 142 meq/L (ref 137–147)

## 2014-07-23 LAB — CBC
HCT: 24.3 % — ABNORMAL LOW (ref 39.0–52.0)
HEMATOCRIT: 27.6 % — AB (ref 39.0–52.0)
Hemoglobin: 7.5 g/dL — ABNORMAL LOW (ref 13.0–17.0)
Hemoglobin: 8.5 g/dL — ABNORMAL LOW (ref 13.0–17.0)
MCH: 25.9 pg — AB (ref 26.0–34.0)
MCH: 26.1 pg (ref 26.0–34.0)
MCHC: 30.9 g/dL (ref 30.0–36.0)
MCHC: 30.8 g/dL (ref 30.0–36.0)
MCV: 83.8 fL (ref 78.0–100.0)
MCV: 84.7 fL (ref 78.0–100.0)
PLATELETS: 231 10*3/uL (ref 150–400)
Platelets: 228 10*3/uL (ref 150–400)
RBC: 2.9 MIL/uL — ABNORMAL LOW (ref 4.22–5.81)
RBC: 3.26 MIL/uL — ABNORMAL LOW (ref 4.22–5.81)
RDW: 17.7 % — AB (ref 11.5–15.5)
RDW: 17.3 % — AB (ref 11.5–15.5)
WBC: 8.6 10*3/uL (ref 4.0–10.5)
WBC: 8.7 10*3/uL (ref 4.0–10.5)

## 2014-07-23 LAB — GLUCOSE, CAPILLARY
GLUCOSE-CAPILLARY: 130 mg/dL — AB (ref 70–99)
Glucose-Capillary: 112 mg/dL — ABNORMAL HIGH (ref 70–99)
Glucose-Capillary: 125 mg/dL — ABNORMAL HIGH (ref 70–99)
Glucose-Capillary: 151 mg/dL — ABNORMAL HIGH (ref 70–99)
Glucose-Capillary: 170 mg/dL — ABNORMAL HIGH (ref 70–99)
Glucose-Capillary: 198 mg/dL — ABNORMAL HIGH (ref 70–99)

## 2014-07-23 LAB — PREPARE RBC (CROSSMATCH)

## 2014-07-23 MED ORDER — ACETAMINOPHEN 325 MG PO TABS
650.0000 mg | ORAL_TABLET | Freq: Once | ORAL | Status: AC
Start: 2014-07-23 — End: 2014-07-23
  Administered 2014-07-23: 650 mg via ORAL
  Filled 2014-07-23: qty 2

## 2014-07-23 MED ORDER — FUROSEMIDE 10 MG/ML IJ SOLN
20.0000 mg | Freq: Once | INTRAMUSCULAR | Status: AC
  Administered 2014-07-23: 20 mg via INTRAVENOUS
  Filled 2014-07-23: qty 2

## 2014-07-23 MED ORDER — OXYCODONE HCL 5 MG PO TABS
5.0000 mg | ORAL_TABLET | Freq: Four times a day (QID) | ORAL | Status: DC | PRN
  Administered 2014-07-23 – 2014-07-25 (×5): 5 mg via ORAL
  Filled 2014-07-23 (×5): qty 1

## 2014-07-23 MED ORDER — DIPHENHYDRAMINE HCL 50 MG/ML IJ SOLN
25.0000 mg | Freq: Once | INTRAMUSCULAR | Status: AC
  Administered 2014-07-23: 25 mg via INTRAVENOUS
  Filled 2014-07-23: qty 1

## 2014-07-23 MED ORDER — SODIUM CHLORIDE 0.9 % IV SOLN
Freq: Once | INTRAVENOUS | Status: AC
  Administered 2014-07-23: 11:00:00 via INTRAVENOUS

## 2014-07-23 NOTE — Evaluation (Signed)
Physical Therapy Evaluation Patient Details Name: Nicholas CooterJames D [name] MRN: [id number] DOB: 1941-06-18 Today's Date: 07/23/2014   History of Present Illness  Adm 07/20/14 from SNF due to GI bleed (hematemesis) and encephalopathy. PMHx-Lt AKA, DM, obesity, CVA, CABG, recent d/c to SNF  Clinical Impression  Pt admitted with above diagnosis. Pt currently with functional limitations due to the deficits listed below (see PT Problem List). Pt will benefit from skilled PT to increase their independence and safety with mobility to allow discharge to the venue listed below.       Follow Up Recommendations SNF    Equipment Recommendations  None recommended by PT    Recommendations for Other Services       Precautions / Restrictions Precautions Precautions: Fall      Mobility  Bed Mobility Overal bed mobility: Needs Assistance;+2 for physical assistance Bed Mobility: Rolling;Sidelying to Sit;Sit to Sidelying Rolling: Min assist Sidelying to sit: Mod assist;+2 for physical assistance;HOB elevated     Sit to sidelying: Mod assist;+2 for physical assistance General bed mobility comments: Rolled Lt with min assist (refused roll Rt "i was just lying that way"); leg over EOB and elevated HOB then pushed up to sit; assist to scoot to eOB  Transfers Overall transfer level: Needs assistance Equipment used: None Transfers: Lateral/Scoot Transfers          Lateral/Scoot Transfers: Max assist;+2 physical assistance;From elevated surface General transfer comment: lateral scoot to his Rt along EOB (on air mattress, bed in slight trendelenberg); x 4  Ambulation/Gait                Stairs            Wheelchair Mobility    Modified Rankin (Stroke Patients Only)       Balance Overall balance assessment: Needs assistance Sitting-balance support: Feet supported;No upper extremity supported Sitting balance-Leahy Scale: Fair Sitting balance - Comments: could place bil hands in  lap and maintain balance; also could unilaterally reach overhead with each arm and balance                                     Pertinent Vitals/Pain Pain Assessment: 0-10 Pain Score: 4  Pain Location: sacral wound Pain Intervention(s): Limited activity within patient's tolerance;Monitored during session;Premedicated before session;Repositioned    Home Living Family/patient expects to be discharged to:: Skilled nursing facility Living Arrangements: Spouse/significant other               Additional Comments: wife present and reports pt was at home ~3 weeks PTA; [facility] to [facility] due to confusion/weakness and d/c to SNF for therapy; at baseline could transfer to wheelchair himself and self-propel (does not use prosthesis due to fall risk); wife uses rollator herself    Prior Function Level of Independence: Needs assistance         Comments: see commments above     Hand Dominance   Dominant Hand: Right    Extremity/Trunk Assessment   Upper Extremity Assessment: Generalized weakness           Lower Extremity Assessment: RLE deficits/detail;LLE deficits/detail RLE Deficits / Details: AAROM WFL; knee extension 2+ (sitting), knee flexion 2/5 (sidelying) LLE Deficits / Details: Lt AKA, hip flexion 3/5 (supine)  Cervical / Trunk Assessment: Kyphotic  Communication   Communication: No difficulties  Cognition Arousal/Alertness: Awake/alert Behavior During Therapy: Flat affect Overall Cognitive Status: Impaired/Different from baseline Area of Impairment:  Following commands;Memory;Problem solving     Memory: Decreased short-term memory Following Commands: Follows one step commands with increased time     Problem Solving: Slow processing;Requires verbal cues      General Comments General comments (skin integrity, edema, etc.): wife present    Exercises General Exercises - Lower Extremity Ankle Circles/Pumps: AROM;Right;5 reps;Seated Long Arc Quad:  AROM;Right;10 reps;Sidelying;Seated Heel Slides: AROM;Right;5 reps;Sidelying (on air mattress) Straight Leg Raises: AROM;Left;5 reps;Supine      Assessment/Plan    PT Assessment Patient needs continued PT services  PT Diagnosis Generalized weakness;Acute pain   PT Problem List Decreased strength;Decreased activity tolerance;Decreased balance;Decreased mobility;Decreased cognition  PT Treatment Interventions DME instruction;Functional mobility training;Patient/family education;Wheelchair mobility training;Therapeutic activities;Therapeutic exercise;Balance training;Neuromuscular re-education   PT Goals (Current goals can be found in the Care Plan section) Acute Rehab PT Goals Patient Stated Goal: wants wound to heal and get stronger PT Goal Formulation: With patient Time For Goal Achievement: 08/06/14 Potential to Achieve Goals: Fair    Frequency Min 2X/week   Barriers to discharge Decreased caregiver support      Co-evaluation               End of Session   Activity Tolerance: Patient limited by fatigue Patient left: in bed;with nursing/sitter in room;with family/visitor present Nurse Communication: Mobility status         Time: [id number] PT Time Calculation (min) (ACUTE ONLY): 20 min   Charges:   PT Evaluation $Initial PT Evaluation Tier I: 1 Procedure PT Treatments $Therapeutic Exercise: 8-22 mins   PT G Codes:          Myrl Lazarus 07/23/2014, 3:24 PM Pager [phone]

## 2014-07-23 NOTE — Progress Notes (Signed)
PATIENT DETAILS Name: Nicholas [name] Age: 73 y.o. Sex: male Date of Birth: Jul 07, 1941 Admit Date: 07/20/2014 Admitting Physician Eduard ClosArshad N Kakrakandy, MD WUJ:WJXBJ,YNWGNFPCP:GATES,ROBERT NEVILL, MD  Subjective: No melena overnight. Has some back pain  Assessment/Plan: Active Problems:   Upper GI bleed: Presented with hematemesis and melena, GI consulted, EGD on 12/18 shows a known bleeding prepyloric ulcer. Await biopsy to see if H. pylori, continue with PPI and Carafate. Bleeding seems to have resolved clinically-suspect drop in Hb-reflects prior loss/IVF-than on ongoing GI loss. Follow closely    Acute blood loss anemia: Secondary to above. Transfused 2 units of PRBC so far, since Hb 7.5 today, will transfuse 1 additional unit of PRBC. As noted above-, suspect drop in Hb-reflects prior loss/IVF-than on ongoing GI loss.     Type 2 diabetes:c/w Lantus 10 units, c/w SSI. CBG's stable    HTN: BP currently controlled without use of antihypertensives-hold atenolol, Imdur-will resume when able.    Acute renal failure: Likely secondary to GI bleed. Resolved with IV fluids, recheck electrolytes in a.m.    Leukocytosis: no fever, no PNA on CXR, UA pending. Resolved, suspect this was stress margination/reactive.Blood culture 12/17    Chronic diastolic heart failure: Clinically compensated. Stop all IVF-as GI bleed resolved, tolerating diet.     Hx of CAD-S/P CABG:assymptomatic-anti-platelets currently on hold given GI bleed    Hx if CVA: non focal exam-anti-platelets currently on hold given GI bleed    Suspected Dementia:per family he has developed intermittent periods of confusion over the past few months, they give hx of sundowning. Suspect ? Vascular dementia.Currently very pleasant, and cooperative.    Hx of PAD-s/p Left BKA    Stage IV Pressure Ulcer:present on admission.Appreciate wound care eval  Disposition: Remain inpatient-back to SNF early next  week  Antibiotics:  None  Anti-infectives    None      DVT Prophylaxis:  SCD's  Code Status:  DNR-reconfirmed with family  Family Communication Son/spouse at bedside 12/18  Procedures:  None  CONSULTS:  GI  Time spent 40 minutes-which includes 50% of the time with face-to-face with patient/ family and coordinating care related to the above assessment and plan.  MEDICATIONS: Scheduled Meds: . sodium chloride   Intravenous Once  . sodium chloride   Intravenous Once  . sodium chloride   Intravenous Once  . brimonidine  1 drop Both Eyes BID   And  . timolol  1 drop Both Eyes BID  . collagenase   Topical Daily  . diphenhydrAMINE  25 mg Intravenous Once  . feeding supplement (GLUCERNA SHAKE)  237 mL Oral TID BM  . feeding supplement (PRO-STAT SUGAR FREE 64)  30 mL Oral BID BM  . furosemide  20 mg Intravenous Once  . insulin aspart  0-9 Units Subcutaneous 6 times per day  . insulin glargine  10 Units Subcutaneous Daily  . latanoprost  1 drop Both Eyes QHS  . [START ON 07/24/2014] pantoprazole (PROTONIX) IV  40 mg Intravenous Q12H  . sodium chloride  3 mL Intravenous Q12H  . sucralfate  1 g Oral TID WC & HS   Continuous Infusions: . sodium chloride    . pantoprozole (PROTONIX) infusion 8 mg/hr (07/23/14 0426)   PRN Meds:.acetaminophen **OR** acetaminophen, morphine injection, nitroGLYCERIN, ondansetron **OR** ondansetron (ZOFRAN) IV, oxyCODONE    PHYSICAL EXAM: Vital signs in last 24 hours: Filed Vitals:   07/22/14 1714 07/22/14 2016 07/23/14 0405 07/23/14 0930  BP: 115/65 106/46  103/34 132/52  Pulse: 60 75 68 65  Temp: 98 F (36.7 C) 98 F (36.7 C) 98.1 F (36.7 C) 99.2 F (37.3 C)  TempSrc: Oral Oral Oral Oral  Resp: 18 18 20 20   Height:      Weight:      SpO2: 98% 99% 100% 100%    Weight change:  Filed Weights   07/21/14 0200  Weight: 101 kg (222 lb 10.6 oz)   Body mass index is 31.95 kg/(m^2).   Gen Exam: Awake and alert with clear  speech.   Neck: Supple, No JVD.   Chest: B/L Clear. No rales or rhonchi  CVS: S1 S2 Regular, no murmurs.  Abdomen: soft, BS +, non tender, non distended.  Extremities: no edema Left BKA Neurologic: Non Focal.   Skin: No Rash.  Wounds: N/A.    Intake/Output from previous day:  Intake/Output Summary (Last 24 hours) at 07/23/14 1111 Last data filed at 07/23/14 0930  Gross per 24 hour  Intake 2163.75 ml  Output   2900 ml  Net -736.25 ml     LAB RESULTS: CBC  Recent Labs Lab 07/21/14 1400 07/22/14 0050 07/22/14 0625 07/22/14 1857 07/23/14 0733  WBC 17.5* 13.7* 12.4* 10.9* 8.6  HGB 8.3* 9.4* 9.0* 8.4* 7.5*  HCT 26.0* 29.1* 27.8* 26.6* 24.3*  PLT 362 270 283 260 228  MCV 81.8 83.1 81.0 81.8 83.8  MCH 26.1 26.9 26.2 25.8* 25.9*  MCHC 31.9 32.3 32.4 31.6 30.9  RDW 17.4* 16.8* 17.1* 17.6* 17.7*    Chemistries   Recent Labs Lab 07/20/14 2153 07/20/14 2206 07/21/14 0724 07/22/14 0625 07/23/14 0733  NA 135* 138 140 140 142  K 4.1 3.9 5.0 3.6* 3.4*  CL 101 107 104 105 110  CO2 18*  --  16* 22 23  GLUCOSE 246* 250* 336* 182* 134*  BUN 110* 104* 129* 118* 63*  CREATININE 1.21 1.30 1.55* 1.83* 1.16  CALCIUM 8.7  --  8.8 8.5 8.0*    CBG:  Recent Labs Lab 07/22/14 1707 07/22/14 2010 07/23/14 0003 07/23/14 0409 07/23/14 0747  GLUCAP 177* 207* 170* 130* 125*    GFR Estimated Creatinine Clearance: 68.6 mL/min (by C-G formula based on Cr of 1.16).  Coagulation profile  Recent Labs Lab 07/20/14 2153  INR 1.21    Cardiac Enzymes  Recent Labs Lab 07/21/14 0255  TROPONINI <0.30    Invalid input(s): POCBNP No results for input(s): DDIMER in the last 72 hours. No results for input(s): HGBA1C in the last 72 hours. No results for input(s): CHOL, HDL, LDLCALC, TRIG, CHOLHDL, LDLDIRECT in the last 72 hours. No results for input(s): TSH, T4TOTAL, T3FREE, THYROIDAB in the last 72 hours.  Invalid input(s): FREET3 No results for input(s): VITAMINB12,  FOLATE, FERRITIN, TIBC, IRON, RETICCTPCT in the last 72 hours.  Recent Labs  07/21/14 0255  LIPASE 53    Urine Studies No results for input(s): UHGB, CRYS in the last 72 hours.  Invalid input(s): UACOL, UAPR, USPG, UPH, UTP, UGL, UKET, UBIL, UNIT, UROB, ULEU, UEPI, UWBC, URBC, UBAC, CAST, UCOM, BILUA  MICROBIOLOGY: Recent Results (from the past 240 hour(s))  Culture, blood (routine x 2)     Status: None (Preliminary result)   Collection Time: 07/21/14  3:05 PM  Result Value Ref Range Status   Specimen Description BLOOD RIGHT ARM  Final   Special Requests BOTTLES DRAWN AEROBIC ONLY [facility]  Final   Culture  Setup Time   Final    07/21/2014 21:40 Performed  at [facility]    Culture   Final           BLOOD CULTURE RECEIVED NO GROWTH TO DATE CULTURE WILL BE HELD FOR 5 DAYS BEFORE ISSUING A FINAL NEGATIVE REPORT [facility]formed at [facility]    Report Status PENDING  Incomplete  Culture, blood (routine x 2)     Status: None (Preliminary result)   Collection Time: 07/21/14  3:05 PM  Result Value Ref Range Status   Specimen Description BLOOD RIGHT HAND  Final   Special Requests BOTTLES DRAWN AEROBIC ONLY 3[facility]  Final   Culture  Setup Time   Final    07/21/2014 21:42 Performed at [facility]    Culture   Final           BLOOD CULTURE RECEIVED NO GROWTH TO DATE CULTURE WILL BE HELD FOR 5 DAYS BEFORE ISSUING A FINAL NEGATIVE REPORT Performed at [facility]    Report Status PENDING  Incomplete    RADIOLOGY STUDIES/RESULTS: Dg Chest 2 View  07/04/2014   CLINICAL DATA:  Acute encephalopathy.  Weakness.  EXAM: CHEST  2 VIEW  COMPARISON:  08/26/2013  FINDINGS: Mild enlargement of the heart is unchanged. 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 Lung markings are mildly prominent but unchanged. No focal airspace disease. No significant pulmonary edema. Trachea is midline. Mild blunting at the right costophrenic angle appears chronic. Degenerative changes in the  thoracic spine without large pleural effusions. Small retrocardiac density is compatible with a hiatal hernia.a  IMPRESSION: No acute chest findings.   Electronically Signed   By: Richarda OverlieAdam  Henn M.D.   On: 07/04/2014 15:48   Ct Head Wo Contrast  07/03/2014   CLINICAL DATA:  Altered mental status  EXAM: CT HEAD WITHOUT CONTRAST  TECHNIQUE: Contiguous axial images were obtained from the base of the skull through the vertex without intravenous contrast.  COMPARISON:  CT head 05/30/2013  FINDINGS: Age-appropriate atrophy.  Negative for hydrocephalus  Mild chronic microvascular ischemic changes in the white matter. Negative for acute infarct. Negative for hemorrhage or mass  Prior mastoidectomy on the right. Left mastoid sinus effusion and middle ear effusion no acute bony abnormality.  IMPRESSION: Mild atrophy and mild chronic microvascular ischemic change in the white matter. No acute abnormality.  Left middle ear and mastoid sinus effusion.   Electronically Signed   By: Marlan Palauharles  Clark M.D.   On: 07/03/2014 13:38   Mr Brain Wo Contrast  07/04/2014   CLINICAL DATA:  Altered mental status and agitation. Hallucinations.  EXAM: MRI HEAD WITHOUT CONTRAST  TECHNIQUE: Multiplanar, multiecho pulse sequences of the brain and surrounding structures were obtained without intravenous contrast.  COMPARISON:  CT head without contrast 07/03/2014.  FINDINGS: The study is mildly degraded by patient motion. No acute infarct, hemorrhage, or mass lesion is present. Mild generalized atrophy and periventricular white matter changes are noted bilaterally. There are remote lacunar infarcts involving the right caudate head and anterior limb internal capsule. The ventricles are proportionate to the degree of atrophy. Flow is present in the major intracranial arteries. Midline images demonstrate degenerative changes in the upper cervical spine.  Paranasal sinuses are clear. Minimal fluid is present in the inferior right mastoid air cells. No  obstructing nasopharyngeal lesion is evident. There is fluid in the left mastoid air cells and middle ear cavity. No obstructing nasopharyngeal lesion is evident.  IMPRESSION: 1. Mild atrophy and white matter disease likely reflects the sequela of chronic microvascular ischemia. 2.  Remote lacunar infarcts of the right basal ganglia. 3. No acute intracranial abnormality. 4. Degenerative changes of the upper cervical spine. 5. Left greater right mastoid effusions. No obstructing nasopharyngeal lesion is evident.   Electronically Signed   By: Gennette Pac M.D.   On: 07/04/2014 19:07   Dg Abd Acute W/chest  07/20/2014   CLINICAL DATA:  Hematemesis.  EXAM: ACUTE ABDOMEN SERIES (ABDOMEN 2 VIEW & CHEST 1 VIEW)  COMPARISON:  July 04, 2014.  FINDINGS: There is no evidence of dilated bowel loops or free intraperitoneal air. No radiopaque calculi or other significant radiographic abnormality is seen. Heart size and mediastinal contours are within normal limits. Both lungs are clear.  IMPRESSION: No evidence of bowel obstruction or ileus. No acute cardiopulmonary disease.   Electronically Signed   By: Roque Lias M.D.   On: 07/20/2014 22:47    Jeoffrey Massed, MD  [facility] Pager:[phone]  If 7PM-7AM, please contact night-coverage [email or web address] Password [email or web address] 07/23/2014, 11:11 AM   LOS: 3 days

## 2014-07-23 NOTE — Progress Notes (Signed)
Physical Therapy Wound Treatment Patient Details  Name: Nicholas [name] MRN: [id number] Date of Birth: Dec 30, 1940  Today's Date: 07/23/2014 Time: 0922-1005 Time Calculation (min): 43 min  Subjective  Subjective: I don't want to get a sore on my hip (from sidelying too much) Patient and Family Stated Goals: get it healed up Date of Onset:  (unknown; chronic wound) Prior Treatments: chronic wound  Pain Score: Pain Score: 9 sacrum, after hydrotherapy (pre-medicated and repositioned); RN spoke to MD and got order for additional pain medicine (po) for next treatment  Wound Assessment  Pressure Ulcer 08/25/13 Stage IV - Full thickness tissue loss with exposed bone, tendon or muscle. non healing pressure ulcer,  came w/ stage 4 per WOC (Active)  Dressing Type ABD;Alginate;Gauze (Comment);Moist to moist;Tape dressing;Other (Comment) 07/23/2014 10:06 AM  Dressing Changed 07/23/2014 10:06 AM  Dressing Change Frequency Daily 07/23/2014 10:06 AM  State of Healing Eschar 07/23/2014 10:06 AM  Site / Wound Assessment Dusky;Painful;Pale;Pink;Yellow 07/23/2014 10:06 AM  % Wound base Red or Granulating 20% 07/23/2014 10:06 AM  % Wound base Yellow 80% 07/23/2014 10:06 AM  % Wound base Black 0% 07/23/2014 10:06 AM  % Wound base Other (Comment) 0% 07/23/2014 10:06 AM  Peri-wound Assessment Intact 07/23/2014 10:06 AM  Wound Length (cm) 9 cm 07/21/2014  1:45 AM  Wound Width (cm) 6 cm 07/21/2014  1:45 AM  Wound Depth (cm) 3.3 cm 07/21/2014  1:45 AM  Tunneling (cm) 0 07/21/2014  1:45 AM  Undermining (cm) 0.6 07/21/2014  1:45 AM  Margins Unattached edges (unapproximated) 07/23/2014 10:06 AM  Drainage Amount Scant 07/23/2014 10:06 AM  Drainage Description Serous 07/23/2014 10:06 AM  Treatment Debridement (Selective);Hydrotherapy (Ultrasonic mist) 07/23/2014 10:06 AM      Hydrotherapy Ultrasonic mist  - wound location: sacrum Ultrasonic mist at 35KHz (+/-3KHz) at ___ percent: 100 % Ultrasonic mist  therapy minutes: 8 min Selective Debridement Selective Debridement - Location: sacral Selective Debridement - Tools Used: Scalpel;Forceps Selective Debridement - Tissue Removed: fibrin, eschar   Wound Assessment and Plan  Wound Therapy - Assess/Plan/Recommendations Wound Therapy - Clinical Statement: pt can benefit from [place] mist to kill bacteria and make the wound acute again to help the body heal itself with help of caregivers Wound Therapy - Functional Problem List: decr mobility, decreased sensation Factors Delaying/Impairing Wound Healing: Immobility;Altered sensation Hydrotherapy Plan: Debridement;Patient/family education;Ultrasonic wound therapy $RemoveBefor'@35'sjCdjmHKBomr$  KHz (+/- 3 KHz);Dressing change Wound Therapy - Frequency: 6X / week Wound Therapy - Current Recommendations: PT Wound Therapy - Follow Up Recommendations: Skilled nursing facility;Wound Care Center Wound Plan: see above  Wound Therapy Goals- Improve the function of patient's integumentary system by progressing the wound(s) through the phases of wound healing (inflammation - proliferation - remodeling) by: Decrease Necrotic Tissue to: 30 Decrease Necrotic Tissue - Progress: Progressing toward goal Increase Granulation Tissue to: 70 Increase Granulation Tissue - Progress: Progressing toward goal  Goals will be updated until maximal potential achieved or discharge criteria met.  Discharge criteria: when goals achieved, discharge from hospital, MD decision/surgical intervention, no progress towards goals, refusal/missing three consecutive treatments without notification or medical reason.  GP     Callaghan Laverdure 07/23/2014, 10:17 AM Pager [phone]

## 2014-07-23 NOTE — Progress Notes (Signed)
Ghimire MD notified of low post transfusion BP(97/32), pt remains stable with trend of low BP. Pt asymptomatic. No further action taken at this time. Will continue to monitor and intervene per orders.

## 2014-07-24 DIAGNOSIS — I2510 Atherosclerotic heart disease of native coronary artery without angina pectoris: Secondary | ICD-10-CM

## 2014-07-24 LAB — TYPE AND SCREEN
ABO/RH(D): O POS
ANTIBODY SCREEN: NEGATIVE
UNIT DIVISION: 0
Unit division: 0
Unit division: 0

## 2014-07-24 LAB — GLUCOSE, CAPILLARY
GLUCOSE-CAPILLARY: 109 mg/dL — AB (ref 70–99)
GLUCOSE-CAPILLARY: 135 mg/dL — AB (ref 70–99)
Glucose-Capillary: 113 mg/dL — ABNORMAL HIGH (ref 70–99)
Glucose-Capillary: 123 mg/dL — ABNORMAL HIGH (ref 70–99)
Glucose-Capillary: 124 mg/dL — ABNORMAL HIGH (ref 70–99)
Glucose-Capillary: 129 mg/dL — ABNORMAL HIGH (ref 70–99)
Glucose-Capillary: 138 mg/dL — ABNORMAL HIGH (ref 70–99)

## 2014-07-24 LAB — BASIC METABOLIC PANEL WITH GFR
Anion gap: 10 (ref 5–15)
BUN: 31 mg/dL — ABNORMAL HIGH (ref 6–23)
CO2: 23 meq/L (ref 19–32)
Calcium: 8 mg/dL — ABNORMAL LOW (ref 8.4–10.5)
Chloride: 107 meq/L (ref 96–112)
Creatinine, Ser: 0.93 mg/dL (ref 0.50–1.35)
GFR calc Af Amer: >90 mL/min
GFR calc non Af Amer: 82 mL/min — ABNORMAL LOW
Glucose, Bld: 112 mg/dL — ABNORMAL HIGH (ref 70–99)
Potassium: 3.5 meq/L — ABNORMAL LOW (ref 3.7–5.3)
Sodium: 140 meq/L (ref 137–147)

## 2014-07-24 LAB — CBC
HCT: 26.7 % — ABNORMAL LOW (ref 39.0–52.0)
Hemoglobin: 8.4 g/dL — ABNORMAL LOW (ref 13.0–17.0)
MCH: 26.9 pg (ref 26.0–34.0)
MCHC: 31.5 g/dL (ref 30.0–36.0)
MCV: 85.6 fL (ref 78.0–100.0)
Platelets: 212 K/uL (ref 150–400)
RBC: 3.12 MIL/uL — ABNORMAL LOW (ref 4.22–5.81)
RDW: 17.8 % — ABNORMAL HIGH (ref 11.5–15.5)
WBC: 8.4 K/uL (ref 4.0–10.5)

## 2014-07-24 NOTE — Progress Notes (Signed)
PATIENT DETAILS Name: Nicholas [name] Age: 73 y.o. Sex: male Date of Birth: 11/06/1940 Admit Date: 07/20/2014 Admitting Physician Eduard ClosArshad N Kakrakandy, MD ZOX:[id number]:GATES,ROBERT NEVILL, MD  Subjective: No acute issues overnight-per RN no melena overnight  Assessment/Plan: Active Problems:   Upper GI bleed: Presented with hematemesis and melena, GI consulted, EGD on 12/18 showed a nonbleeding prepyloric ulcer. Await biopsy to see if H. pylori, continue with PPI and Carafate. Bleeding seems to have resolved clinically. Hb stable this am.Follow closely. Advance to regular diet.    Acute blood loss anemia: Secondary to above. Transfused 3 units of PRBC so far.GI bleeding seems to have resolved.Monitor CBC periodically     Type 2 diabetes:c/w Lantus 10 units, c/w SSI. CBG's stable    HTN: BP currently controlled/soft without use of antihypertensives-hold atenolol, Imdur-will resume when able.    Acute renal failure: Likely secondary to GI bleed. Resolved with IV fluids, recheck electrolytes periodically    Leukocytosis: no fever, no PNA on CXR, UA pending. Resolved, suspect this was stress margination/reactive.Blood culture 12/17 neg so far.    Chronic diastolic heart failure: Clinically compensated.     Hx of CAD-S/P CABG:assymptomatic-anti-platelets currently on hold given GI bleed-per GI to resume in 4 weeks, with high dose PPI    Hx if CVA: non focal exam-anti-platelets currently on hold given GI bleed-per GI to resume in 4 weeks, with high dose PPI    Suspected Dementia:per family he has developed intermittent periods of confusion over the past few months, they give hx of sundowning. Suspect ? Vascular dementia.Currently very pleasant, and cooperative.    Hx of PAD-s/p Left BKA    Stage IV Pressure Ulcer:present on admission.Appreciate wound care eval  Disposition: Remain inpatient-back to SNF 12/21 if remains stable.  Antibiotics:  None  Anti-infectives    None       DVT Prophylaxis:  SCD's  Code Status:  DNR-reconfirmed with family  Family Communication Son/spouse at bedside 12/18  Procedures:  None  CONSULTS:  GI  Time spent 40 minutes-which includes 50% of the time with face-to-face with patient/ family and coordinating care related to the above assessment and plan.  MEDICATIONS: Scheduled Meds: . sodium chloride   Intravenous Once  . sodium chloride   Intravenous Once  . brimonidine  1 drop Both Eyes BID   And  . timolol  1 drop Both Eyes BID  . collagenase   Topical Daily  . feeding supplement (GLUCERNA SHAKE)  237 mL Oral TID BM  . feeding supplement (PRO-STAT SUGAR FREE 64)  30 mL Oral BID BM  . insulin aspart  0-9 Units Subcutaneous 6 times per day  . insulin glargine  10 Units Subcutaneous Daily  . latanoprost  1 drop Both Eyes QHS  . pantoprazole (PROTONIX) IV  40 mg Intravenous Q12H  . sodium chloride  3 mL Intravenous Q12H  . sucralfate  1 g Oral TID WC & HS   Continuous Infusions: . sodium chloride 10 mL/hr at 07/23/14 2150   PRN Meds:.acetaminophen **OR** acetaminophen, morphine injection, nitroGLYCERIN, ondansetron **OR** ondansetron (ZOFRAN) IV, oxyCODONE    PHYSICAL EXAM: Vital signs in last 24 hours: Filed Vitals:   07/23/14 1740 07/23/14 1846 07/23/14 2039 07/24/14 0412  BP:  115/39 126/34 108/29  Pulse: 72  74 68  Temp: 98.2 F (36.8 C)  98.4 F (36.9 C) 99.1 F (37.3 C)  TempSrc: Oral  Oral Oral  Resp: 16  18 17   Height:  5\' 10"  (1.778 m)   Weight:   102.75 kg (226 lb 8.4 oz)   SpO2: 100%  100% 100%    Weight change:  Filed Weights   07/21/14 0200 07/23/14 2039  Weight: 101 kg (222 lb 10.6 oz) 102.75 kg (226 lb 8.4 oz)   Body mass index is 32.5 kg/(m^2).   Gen Exam: Awake and alert with clear speech.   Neck: Supple, No JVD.   Chest: B/L Clear. No rales or rhonchi  CVS: S1 S2 Regular, no murmurs.  Abdomen: soft, BS +, non tender, non distended.  Extremities: no edema Left  BKA Neurologic: Non Focal.   Skin: No Rash.  Wounds: N/A.    Intake/Output from previous day:  Intake/Output Summary (Last 24 hours) at 07/24/14 1037 Last data filed at 07/24/14 0600  Gross per 24 hour  Intake 1401.25 ml  Output   1800 ml  Net -398.75 ml     LAB RESULTS: CBC  Recent Labs Lab 07/22/14 0625 07/22/14 1857 07/23/14 0733 07/23/14 1825 07/24/14 0622  WBC 12.4* 10.9* 8.6 8.7 8.4  HGB 9.0* 8.4* 7.5* 8.5* 8.4*  HCT 27.8* 26.6* 24.3* 27.6* 26.7*  PLT 283 260 228 231 212  MCV 81.0 81.8 83.8 84.7 85.6  MCH 26.2 25.8* 25.9* 26.1 26.9  MCHC 32.4 31.6 30.9 30.8 31.5  RDW 17.1* 17.6* 17.7* 17.3* 17.8*    Chemistries   Recent Labs Lab 07/20/14 2153 07/20/14 2206 07/21/14 0724 07/22/14 0625 07/23/14 0733 07/24/14 0622  NA 135* 138 140 140 142 140  K 4.1 3.9 5.0 3.6* 3.4* 3.5*  CL 101 107 104 105 110 107  CO2 18*  --  16* 22 23 23   GLUCOSE 246* 250* 336* 182* 134* 112*  BUN 110* 104* 129* 118* 63* 31*  CREATININE 1.21 1.30 1.55* 1.83* 1.16 0.93  CALCIUM 8.7  --  8.8 8.5 8.0* 8.0*    CBG:  Recent Labs Lab 07/23/14 1728 07/23/14 2031 07/24/14 0008 07/24/14 0417 07/24/14 0827  GLUCAP 198* 112* 123* 109* 113*    GFR Estimated Creatinine Clearance: 86.2 mL/min (by C-G formula based on Cr of 0.93).  Coagulation profile  Recent Labs Lab 07/20/14 2153  INR 1.21    Cardiac Enzymes  Recent Labs Lab 07/21/14 0255  TROPONINI <0.30    Invalid input(s): POCBNP No results for input(s): DDIMER in the last 72 hours. No results for input(s): HGBA1C in the last 72 hours. No results for input(s): CHOL, HDL, LDLCALC, TRIG, CHOLHDL, LDLDIRECT in the last 72 hours. No results for input(s): TSH, T4TOTAL, T3FREE, THYROIDAB in the last 72 hours.  Invalid input(s): FREET3 No results for input(s): VITAMINB12, FOLATE, FERRITIN, TIBC, IRON, RETICCTPCT in the last 72 hours. No results for input(s): LIPASE, AMYLASE in the last 72 hours.  Urine  Studies No results for input(s): UHGB, CRYS in the last 72 hours.  Invalid input(s): UACOL, UAPR, USPG, UPH, UTP, UGL, UKET, UBIL, UNIT, UROB, ULEU, UEPI, UWBC, URBC, UBAC, CAST, UCOM, BILUA  MICROBIOLOGY: Recent Results (from the past 240 hour(s))  Culture, blood (routine x 2)     Status: None (Preliminary result)   Collection Time: 07/21/14  3:05 PM  Result Value Ref Range Status   Specimen Description BLOOD RIGHT ARM  Final   Special Requests BOTTLES DRAWN AEROBIC ONLY [facility]  Final   Culture  Setup Time   Final    07/21/2014 21:40 Performed at [facility]    Culture   Final  BLOOD CULTURE RECEIVED NO GROWTH TO DATE CULTURE WILL BE HELD FOR 5 DAYS BEFORE ISSUING A FINAL NEGATIVE REPORT Performed at [facility]    Report Status PENDING  Incomplete  Culture, blood (routine x 2)     Status: None (Preliminary result)   Collection Time: 07/21/14  3:05 PM  Result Value Ref Range Status   Specimen Description BLOOD RIGHT HAND  Final   Special Requests BOTTLES DRAWN AEROBIC ONLY [facility]  Final   Culture  Setup Time   Final    07/21/2014 21:42 Performed at [facility]    Culture   Final           BLOOD CULTURE RECEIVED NO GROWTH TO DATE CULTURE WILL BE HELD FOR 5 DAYS BEFORE ISSUING A FINAL NEGATIVE REPORT Performed at [facility]    Report Status PENDING  Incomplete    RADIOLOGY STUDIES/RESULTS: Dg Chest 2 View  07/04/2014   CLINICAL DATA:  Acute encephalopathy.  Weakness.  EXAM: CHEST  2 VIEW  COMPARISON:  08/26/2013  FINDINGS: Mild enlargement of the heart is unchanged. 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 Lung markings are mildly prominent but unchanged. No focal airspace disease. No significant pulmonary edema. Trachea is midline. Mild blunting at the right costophrenic angle appears chronic. Degenerative changes in the thoracic spine without large pleural effusions. Small retrocardiac density is compatible with a hiatal hernia.a  IMPRESSION:  No acute chest findings.   Electronically Signed   By: Richarda Overlie M.D.   On: 07/04/2014 15:48   Ct Head Wo Contrast  07/03/2014   CLINICAL DATA:  Altered mental status  EXAM: CT HEAD WITHOUT CONTRAST  TECHNIQUE: Contiguous axial images were obtained from the base of the skull through the vertex without intravenous contrast.  COMPARISON:  CT head 05/30/2013  FINDINGS: Age-appropriate atrophy.  Negative for hydrocephalus  Mild chronic microvascular ischemic changes in the white matter. Negative for acute infarct. Negative for hemorrhage or mass  Prior mastoidectomy on the right. Left mastoid sinus effusion and middle ear effusion no acute bony abnormality.  IMPRESSION: Mild atrophy and mild chronic microvascular ischemic change in the white matter. No acute abnormality.  Left middle ear and mastoid sinus effusion.   Electronically Signed   By: Marlan Palau M.D.   On: 07/03/2014 13:38   Mr Brain Wo Contrast  07/04/2014   CLINICAL DATA:  Altered mental status and agitation. Hallucinations.  EXAM: MRI HEAD WITHOUT CONTRAST  TECHNIQUE: Multiplanar, multiecho pulse sequences of the brain and surrounding structures were obtained without intravenous contrast.  COMPARISON:  CT head without contrast 07/03/2014.  FINDINGS: The study is mildly degraded by patient motion. No acute infarct, hemorrhage, or mass lesion is present. Mild generalized atrophy and periventricular white matter changes are noted bilaterally. There are remote lacunar infarcts involving the right caudate head and anterior limb internal capsule. The ventricles are proportionate to the degree of atrophy. Flow is present in the major intracranial arteries. Midline images demonstrate degenerative changes in the upper cervical spine.  Paranasal sinuses are clear. Minimal fluid is present in the inferior right mastoid air cells. No obstructing nasopharyngeal lesion is evident. There is fluid in the left mastoid air cells and middle ear cavity. No  obstructing nasopharyngeal lesion is evident.  IMPRESSION: 1. Mild atrophy and white matter disease likely reflects the sequela of chronic microvascular ischemia. 2. Remote lacunar infarcts of the right basal ganglia. 3. No acute intracranial abnormality. 4. Degenerative changes of the upper cervical spine.  5. Left greater right mastoid effusions. No obstructing nasopharyngeal lesion is evident.   Electronically Signed   By: Gennette Pachris  Mattern M.D.   On: 07/04/2014 19:07   Dg Abd Acute W/chest  07/20/2014   CLINICAL DATA:  Hematemesis.  EXAM: ACUTE ABDOMEN SERIES (ABDOMEN 2 VIEW & CHEST 1 VIEW)  COMPARISON:  July 04, 2014.  FINDINGS: There is no evidence of dilated bowel loops or free intraperitoneal air. No radiopaque calculi or other significant radiographic abnormality is seen. Heart size and mediastinal contours are within normal limits. Both lungs are clear.  IMPRESSION: No evidence of bowel obstruction or ileus. No acute cardiopulmonary disease.   Electronically Signed   By: Roque LiasJames  Green M.D.   On: 07/20/2014 22:47    Jeoffrey MassedGHIMIRE,SHANKER, MD  [facility] Pager:[phone]  If 7PM-7AM, please contact night-coverage [email or web address] Password [email or web address] 07/24/2014, 10:37 AM   LOS: 4 days

## 2014-07-24 NOTE — Clinical Social Work Psychosocial (Signed)
     Clinical Social Work Department BRIEF PSYCHOSOCIAL ASSESSMENT 07/24/2014  Patient:  Nicholas [name],Nicholas [name]     Account Number:  [id number]     Admit date:  07/20/2014  Clinical Social Worker:  Harless NakayamaAMBELAL,Jayvion Stefanski, LCSWA  Date/Time:  07/24/2014 03:38 PM  Referred by:  Physician  Date Referred:  07/24/2014 Referred for  SNF Placement   Other Referral:   Interview type:  Patient Other interview type:   Spoke with pt and pt family at bedside    PSYCHOSOCIAL DATA Living Status:  FACILITY [facility] Level of care:  Skilled Nursing Facility Primary support name:  Reather Laurenceudrey Rigaud Primary support relationship to patient:  SPOUSE Degree of support available:   Pt has good family support    CURRENT CONCERNS Current Concerns  Post-Acute Placement   Other Concerns:    SOCIAL WORK ASSESSMENT / PLAN CSW visited pt room and spoke with pt and pt family at bedside. Pt wife and sons present. Pt spoke very little with CSW during assessment even though CSW tried to engage. Pt wife and sons answered most of CSW questions. Pt family confirmed [facility] and has been there for a couple of weeks. Pt did confirm that he is pleased with care and family agreed. They are in agreement that plan will be to return to [facility]. Pt family hopeful that plan will be for discharge tomorrow.   Assessment/plan status:  Psychosocial Support/Ongoing Assessment of Needs Other assessment/ plan:   Information/referral to community resources:   None needed    PATIENTS/FAMILYS RESPONSE TO PLAN OF CARE: Pt and pt family reactions appropriate and they are agreeable to returning to SNF.    Lorielle Boehning Lajean Savermbelal, LCSWA  Weekend CSW  [phone]

## 2014-07-24 NOTE — Progress Notes (Signed)
Pt voided 800 ml. Bladder scanned showed 0 ml residual.

## 2014-07-25 ENCOUNTER — Encounter (HOSPITAL_COMMUNITY): Payer: Self-pay | Admitting: Gastroenterology

## 2014-07-25 DIAGNOSIS — I739 Peripheral vascular disease, unspecified: Secondary | ICD-10-CM

## 2014-07-25 DIAGNOSIS — K922 Gastrointestinal hemorrhage, unspecified: Secondary | ICD-10-CM

## 2014-07-25 LAB — CBC
HCT: 26.9 % — ABNORMAL LOW (ref 39.0–52.0)
HEMOGLOBIN: 8.4 g/dL — AB (ref 13.0–17.0)
MCH: 26.8 pg (ref 26.0–34.0)
MCHC: 31.2 g/dL (ref 30.0–36.0)
MCV: 85.7 fL (ref 78.0–100.0)
Platelets: 232 10*3/uL (ref 150–400)
RBC: 3.14 MIL/uL — ABNORMAL LOW (ref 4.22–5.81)
RDW: 18 % — AB (ref 11.5–15.5)
WBC: 7.3 10*3/uL (ref 4.0–10.5)

## 2014-07-25 LAB — GLUCOSE, CAPILLARY
GLUCOSE-CAPILLARY: 165 mg/dL — AB (ref 70–99)
Glucose-Capillary: 108 mg/dL — ABNORMAL HIGH (ref 70–99)
Glucose-Capillary: 125 mg/dL — ABNORMAL HIGH (ref 70–99)
Glucose-Capillary: 87 mg/dL (ref 70–99)
Glucose-Capillary: 99 mg/dL (ref 70–99)

## 2014-07-25 LAB — BASIC METABOLIC PANEL
Anion gap: 9 (ref 5–15)
BUN: 17 mg/dL (ref 6–23)
CO2: 23 mEq/L (ref 19–32)
Calcium: 8.3 mg/dL — ABNORMAL LOW (ref 8.4–10.5)
Chloride: 110 mEq/L (ref 96–112)
Creatinine, Ser: 0.74 mg/dL (ref 0.50–1.35)
GFR, EST NON AFRICAN AMERICAN: 90 mL/min — AB (ref >90)
GLUCOSE: 99 mg/dL (ref 70–99)
POTASSIUM: 3.5 meq/L — AB (ref 3.7–5.3)
Sodium: 142 mEq/L (ref 137–147)

## 2014-07-25 MED ORDER — INSULIN GLARGINE 100 UNIT/ML ~~LOC~~ SOLN: 10.0000 [IU] | Freq: Every day | SUBCUTANEOUS | Status: AC

## 2014-07-25 MED ORDER — HYDROCODONE-ACETAMINOPHEN 10-325 MG PO TABS: ORAL_TABLET | ORAL | Status: AC

## 2014-07-25 MED ORDER — INSULIN ASPART 100 UNIT/ML ~~LOC~~ SOLN: SUBCUTANEOUS | Status: AC

## 2014-07-25 MED ORDER — PANTOPRAZOLE SODIUM 40 MG PO TBEC: 40.0000 mg | DELAYED_RELEASE_TABLET | Freq: Two times a day (BID) | ORAL | Status: AC

## 2014-07-25 NOTE — Clinical Social Work Note (Addendum)
Patient medically stable for discharge back to [name] today. Discharge information transmitted to facility and patient will be transported by ambulance. Nicholas [name] called, home and mobile phone and messages left regarding d/c.  Nicholas [name], [place], [facility] SW Licensed Clinical Social Worker Clinical Social Work Department [facility] [phone]

## 2014-07-25 NOTE — Progress Notes (Signed)
Physical Therapy Treatment Patient Details Name: Nicholas [name] MRN: [id number] DOB: 03/10/1941 Today's Date: 07/25/2014    History of Present Illness Adm 07/20/14 from SNF due to GI bleed (hematemesis) and encephalopathy. PMHx-Lt AKA, DM, obesity, CVA, CABG, recent d/c to SNF    PT Comments    Pt progressing towards physical therapy goals. Transfer OOB was deferred as hydro was anticipating seeing pt after PT session. Feel that pt could laterally scoot to drop-arm chair well next session. Pt demonstrated increased independence with transfers and basic bed mobility. Will continue to follow.   Follow Up Recommendations  SNF;Supervision/Assistance - 24 hour     Equipment Recommendations  None recommended by PT    Recommendations for Other Services       Precautions / Restrictions Precautions Precautions: Fall Precaution Comments: L AKA Restrictions Weight Bearing Restrictions: No    Mobility  Bed Mobility Overal bed mobility: Needs Assistance;+2 for physical assistance Bed Mobility: Rolling;Sidelying to Sit;Sit to Sidelying Rolling: Min assist Sidelying to sit: Mod assist;HOB elevated   Sit to supine: Mod assist   General bed mobility comments: Rolled right with min assist. Assist for scooting hips with bed pad as well as for trunk elevation.   Transfers Overall transfer level: Needs assistance Equipment used: None Transfers: Lateral/Scoot Transfers          Lateral/Scoot Transfers: Mod assist;+2 physical assistance General transfer comment: Lateral scoot to his right along EOB (on air mattress, bed in slight trendelenberg) x3 large scoots with bed pad for assist.   Ambulation/Gait                 Stairs            Wheelchair Mobility    Modified Rankin (Stroke Patients Only)       Balance Overall balance assessment: Needs assistance Sitting-balance support: Feet supported;No upper extremity supported Sitting balance-Leahy Scale: Fair                               Cognition Arousal/Alertness: Awake/alert Behavior During Therapy: Flat affect Overall Cognitive Status: Impaired/Different from baseline Area of Impairment: Following commands;Memory;Problem solving Orientation Level: Disoriented to;Place;Time;Situation   Memory: Decreased short-term memory Following Commands: Follows one step commands with increased time     Problem Solving: Slow processing;Requires verbal cues General Comments: decreased attention; tangential and talkative but what he was saying was not accurate.  Did know he was in hospital.      Exercises      General Comments        Pertinent Vitals/Pain Pain Assessment: 0-10 Pain Score: 5  Pain Location: Sacral wound Pain Intervention(s): Limited activity within patient's tolerance;Monitored during session;Premedicated before session;Repositioned    Home Living                      Prior Function            PT Goals (current goals can now be found in the care plan section) Acute Rehab PT Goals Patient Stated Goal: Be comfortable PT Goal Formulation: With patient Time For Goal Achievement: 08/06/14 Potential to Achieve Goals: Fair Progress towards PT goals: Progressing toward goals    Frequency  Min 2X/week    PT Plan Current plan remains appropriate    Co-evaluation             End of Session   Activity Tolerance: Patient limited by fatigue Patient left: in  bed;with call bell/phone within reach;with bed alarm set;with nursing/sitter in room     Time: 0936-1000 PT Time Calculation (min) (ACUTE ONLY): 24 min  Charges:  $Therapeutic Activity: 23-37 mins                    G Codes:      Nicholas [name], Nicholas [name] 07/25/2014, 1:35 PM   Nicholas SlipperLaura Nicholas [name], PT, DPT Acute Rehabilitation Services Pager: [phone]

## 2014-07-25 NOTE — Progress Notes (Signed)
Physical Therapy Wound Treatment Patient Details  Name: Nicholas [name] MRN: [id number] Date of Birth: Jan 13, 1941  Today's Date: 07/25/2014 Time: 1128-1208 Time Calculation (min): 40 min  Subjective  Subjective: I don't want to get a sore on my hip (from sidelying too much) Patient and Family Stated Goals: get it healed up Date of Onset:  (unknown; chronic wound) Prior Treatments: chronic wound  Pain Score: Pain Score: 7  (also premedicated prior to hydrotherapy)  Wound Assessment  Pressure Ulcer 08/25/13 Stage IV - Full thickness tissue loss with exposed bone, tendon or muscle. non healing pressure ulcer,  came w/ stage 4 per WOC (Active)  Dressing Type ABD;Gauze (Comment);Moist to moist;Tape dressing;Other (Comment);Barrier Film (skin prep) 07/25/2014 12:19 PM  Dressing Changed 07/25/2014 12:19 PM  Dressing Change Frequency Daily 07/25/2014 12:19 PM  State of Healing Eschar 07/25/2014 12:19 PM  Site / Wound Assessment Dusky;Painful;Pale;Pink;Yellow 07/25/2014 12:19 PM  % Wound base Red or Granulating 20% 07/25/2014 12:19 PM  % Wound base Yellow 80% 07/25/2014 12:19 PM  % Wound base Black 0% 07/25/2014 12:19 PM  % Wound base Other (Comment) 0% 07/25/2014 12:19 PM  Peri-wound Assessment Intact 07/25/2014 12:19 PM  Wound Length (cm) 9 cm 07/21/2014  1:45 AM  Wound Width (cm) 6 cm 07/21/2014  1:45 AM  Wound Depth (cm) 3.3 cm 07/21/2014  1:45 AM  Tunneling (cm) 0 07/21/2014  1:45 AM  Undermining (cm) 0.6 07/21/2014  1:45 AM  Margins Unattached edges (unapproximated) 07/25/2014 12:19 PM  Drainage Amount Scant 07/25/2014 12:19 PM  Drainage Description Serous 07/25/2014 12:19 PM  Treatment Debridement (Selective);Hydrotherapy (Ultrasonic mist) 07/25/2014 12:19 PM      Hydrotherapy Ultrasonic mist  - wound location: sacrum Ultrasonic mist at 35KHz (+/-3KHz) at ___ percent: 100 % Ultrasonic mist therapy minutes: 9 min Selective Debridement Selective Debridement - Location:  sacral Selective Debridement - Tools Used: Scalpel;Forceps Selective Debridement - Tissue Removed: fibrin, eschar   Wound Assessment and Plan  Wound Therapy - Assess/Plan/Recommendations Wound Therapy - Clinical Statement: pt can benefit from [place] mist to kill bacteria and make the wound acute again to help the body heal itself with help of caregivers Wound Therapy - Functional Problem List: decr mobility, decreased sensation Factors Delaying/Impairing Wound Healing: Immobility;Altered sensation Hydrotherapy Plan: Debridement;Patient/family education;Ultrasonic wound therapy $RemoveBefor'@35'tTkCbFzlpqHS$  KHz (+/- 3 KHz);Dressing change Wound Therapy - Frequency: 6X / week Wound Therapy - Current Recommendations: PT Wound Therapy - Follow Up Recommendations: Skilled nursing facility;Wound Care Center Wound Plan: see above  Wound Therapy Goals- Improve the function of patient's integumentary system by progressing the wound(s) through the phases of wound healing (inflammation - proliferation - remodeling) by: Decrease Necrotic Tissue to: 30 Decrease Necrotic Tissue - Progress: Progressing toward goal Increase Granulation Tissue to: 70 Increase Granulation Tissue - Progress: Progressing toward goal  Goals will be updated until maximal potential achieved or discharge criteria met.  Discharge criteria: when goals achieved, discharge from hospital, MD decision/surgical intervention, no progress towards goals, refusal/missing three consecutive treatments without notification or medical reason.  GP     Corynn Solberg 07/25/2014, 12:22 PM Pager [phone]

## 2014-07-25 NOTE — Care Management Note (Signed)
CARE MANAGEMENT NOTE 07/25/2014  Patient:  Nicholas [name], Nicholas [name]   Account Number:  [id number]  Date Initiated:  07/22/2014  Documentation initiated by:  Lokelani Lutes  Subjective/Objective Assessment:   CM following for progression and d/c planning.     Action/Plan:   07/22/2014 Met with pt and family, plan is to return to SNF, [facility].  07/25/2014 IM given again today, plan for d/c back to SNF.   Anticipated DC Date:  07/25/2014   Anticipated DC Plan:  SKILLED NURSING FACILITY         Choice offered to / List presented to:             Status of service:  Completed, signed off Medicare Important Message given?  YES (If response is "NO", the following Medicare IM given date fields will be blank) Date Medicare IM given:  07/22/2014 Medicare IM given by:  Juanda Luba Date Additional Medicare IM given:  07/25/2014 Additional Medicare IM given by:  [facility]  Discharge Disposition:  [place]  Per UR Regulation:    If discussed at Long Length of Stay Meetings, dates discussed:    Comments:

## 2014-07-25 NOTE — Discharge Summary (Signed)
PATIENT DETAILS Name: Nicholas [name] Age: 73 y.o. Sex: male Date of Birth: 10-26-1940 MRN: [id number]. Admitting Physician: Eduard ClosArshad N Kakrakandy, MD WUJ:WJXBJ,YNWGNFPCP:GATES,ROBERT NEVILL, MD  Admit Date: 07/20/2014 Discharge date: 07/25/2014  Recommendations for Outpatient Follow-up:  1. Please recheck CBC and BMET in 1 week. 2. Currently off all anti-hypertensives, BP slowly creeping up, please resume when able. 3. Please follow blood cultures drawn on 07/21/14 till final 4. Suspect vascular dementia-please consider Neurology referral  PRIMARY DISCHARGE DIAGNOSIS:  Active Problems:   PAD (peripheral artery disease)   CAD (coronary artery disease), native coronary artery   Chronic diastolic heart failure   Hematemesis   GI bleed   Diabetes mellitus type 2, controlled      PAST MEDICAL HISTORY: Past Medical History  Diagnosis Date  . Hypertension   . Peripheral vascular disease   . Ulcer     left foot  . Dyslipidemia   . Chronic venous insufficiency   . Sleep apnea   . Morbid obesity   . Diverticulosis   . Benign prostatic hypertrophy   . Glaucoma   . Gout   . Depression   . Insomnia   . CAD (coronary artery disease), native coronary artery   . GERD (gastroesophageal reflux disease)   . Angina   . Pneumonia 12/11/11  . Type II diabetes mellitus   . Chronic daily headache     "~ all the time"  . Seizures   . Stroke ~ 1983    "little weaker right hand"  . MRSA (methicillin resistant staph aureus) culture positive Dec. 17, 2013     right heel infection    DISCHARGE MEDICATIONS: Current Discharge Medication List    START taking these medications   Details  insulin glargine (LANTUS) 100 UNIT/ML injection Inject 0.1 mLs (10 Units total) into the skin daily. Qty: 10 mL, Refills: 11      CONTINUE these medications which have CHANGED   Details  HYDROcodone-acetaminophen (NORCO) 10-325 MG per tablet Take one tablet by mouth three times daily for pain Qty: 20 tablet,  Refills: 0    insulin aspart (NOVOLOG) 100 UNIT/ML injection insulin aspart (novoLOG) injection 0-9 Units Three times daily with meals Correction coverage: Sensitive (thin, NPO, renal) CBG < 70: implement hypoglycemia protocol CBG 70 - 120: 0 units CBG 121 - 150: 1 unit CBG 151 - 200: 2 units CBG 201 - 250: 3 units CBG 251 - 300: 5 units CBG 301 - 350: 7 units CBG 351 - 400: 9 units CBG > 400: call MD Qty: 10 mL, Refills: 11    pantoprazole (PROTONIX) 40 MG tablet Take 1 tablet (40 mg total) by mouth 2 (two) times daily. Qty: 30 tablet, Refills: 0      CONTINUE these medications which have NOT CHANGED   Details  allopurinol (ZYLOPRIM) 300 MG tablet Take 1 tablet (300 mg total) by mouth every morning. Qty: 30 tablet, Refills: 1    Amino Acids-Protein Hydrolys (FEEDING SUPPLEMENT, PRO-STAT SUGAR FREE 64,) LIQD Take 30 mLs by mouth 2 (two) times daily with a meal. Qty: 900 mL, Refills: 0    bisacodyl (DULCOLAX) 10 MG suppository Place 10 mg rectally as needed for moderate constipation (constipation not relieved by Milk of Magnesium).    brimonidine-timolol (COMBIGAN) 0.2-0.5 % ophthalmic solution Place 1 drop into both eyes every 12 (twelve) hours.     latanoprost (XALATAN) 0.005 % ophthalmic solution Place 1 drop into both eyes at bedtime.      magnesium  hydroxide (MILK OF MAGNESIA) 400 MG/5ML suspension Take 30 mLs by mouth daily as needed for mild constipation.    mirtazapine (REMERON) 15 MG tablet Take 1 tablet (15 mg total) by mouth at bedtime. Qty: 30 tablet, Refills: 0    Multiple Vitamin (MULTIVITAMIN WITH MINERALS) TABS tablet Take 1 tablet by mouth daily. Qty: 30 tablet, Refills: 0    risperiDONE (RISPERDAL) 0.5 MG tablet Take 2 tablets (1 mg total) by mouth at bedtime. Qty: 30 tablet, Refills: 0    simvastatin (ZOCOR) 20 MG tablet Take 1 tablet (20 mg total) by mouth every evening. Qty: 30 tablet, Refills: 1    sodium phosphate (FLEET) 7-19 GM/118ML ENEM Place  1 enema rectally once as needed for severe constipation (for constipation not relieved by milk of magnesium).    tamsulosin (FLOMAX) 0.4 MG CAPS capsule Take 2 capsules (0.8 mg total) by mouth at bedtime. Qty: 60 capsule, Refills: 1    alum & mag hydroxide-simeth (MAALOX/MYLANTA) 200-200-20 MG/5ML suspension Take 30 mLs by mouth as needed for indigestion or heartburn. Qty: 355 mL, Refills: 0    diclofenac sodium (VOLTAREN) 1 % GEL Apply 1 application topically 4 (four) times daily as needed. Applies to both knees as needed for pain Qty: 1 Tube, Refills: 0    feeding supplement, GLUCERNA SHAKE, (GLUCERNA SHAKE) LIQD Take 237 mLs by mouth 2 (two) times daily between meals. Qty: 237 mL, Refills: 0    nitroGLYCERIN (NITROSTAT) 0.4 MG SL tablet Place 1 tablet (0.4 mg total) under the tongue every 5 (five) minutes as needed. For chest pain Qty: 30 tablet, Refills: 12    ondansetron (ZOFRAN) 4 MG tablet Take 1 tablet (4 mg total) by mouth every 6 (six) hours as needed for nausea. Qty: 20 tablet, Refills: 0      STOP taking these medications     aspirin 81 MG EC tablet      atenolol (TENORMIN) 50 MG tablet      clopidogrel (PLAVIX) 75 MG tablet      furosemide (LASIX) 40 MG tablet      isosorbide mononitrate (IMDUR) 30 MG 24 hr tablet      magnesium oxide (MAG-OX) 400 MG tablet      perphenazine (TRILAFON) 16 MG tablet      potassium chloride SA (K-DUR,KLOR-CON) 20 MEQ tablet      nitrofurantoin, macrocrystal-monohydrate, (MACROBID) 100 MG capsule         ALLERGIES:   Allergies  Allergen Reactions  . Contrast Media [Iodinated Diagnostic Agents] Other (See Comments)    Shaking and feeling terrible    BRIEF HPI:  See H&P, Labs, Consult and Test reports for all details in brief, patient was admitted for evaluation of hemetemesis and melena.  CONSULTATIONS:   GI  PERTINENT RADIOLOGIC STUDIES: Dg Chest 2 View  07/04/2014   CLINICAL DATA:  Acute encephalopathy.   Weakness.  EXAM: CHEST  2 VIEW  COMPARISON:  08/26/2013  FINDINGS: Mild enlargement of the heart is unchanged. 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 Lung markings are mildly prominent but unchanged. No focal airspace disease. No significant pulmonary edema. Trachea is midline. Mild blunting at the right costophrenic angle appears chronic. Degenerative changes in the thoracic spine without large pleural effusions. Small retrocardiac density is compatible with a hiatal hernia.a  IMPRESSION: No acute chest findings.   Electronically Signed   By: Richarda Overlie M.D.   On: 07/04/2014 15:48   Ct Head Wo Contrast  07/03/2014   CLINICAL DATA:  Altered mental status  EXAM: CT HEAD WITHOUT CONTRAST  TECHNIQUE: Contiguous axial images were obtained from the base of the skull through the vertex without intravenous contrast.  COMPARISON:  CT head 05/30/2013  FINDINGS: Age-appropriate atrophy.  Negative for hydrocephalus  Mild chronic microvascular ischemic changes in the white matter. Negative for acute infarct. Negative for hemorrhage or mass  Prior mastoidectomy on the right. Left mastoid sinus effusion and middle ear effusion no acute bony abnormality.  IMPRESSION: Mild atrophy and mild chronic microvascular ischemic change in the white matter. No acute abnormality.  Left middle ear and mastoid sinus effusion.   Electronically Signed   By: Marlan Palau M.D.   On: 07/03/2014 13:38   Mr Brain Wo Contrast  07/04/2014   CLINICAL DATA:  Altered mental status and agitation. Hallucinations.  EXAM: MRI HEAD WITHOUT CONTRAST  TECHNIQUE: Multiplanar, multiecho pulse sequences of the brain and surrounding structures were obtained without intravenous contrast.  COMPARISON:  CT head without contrast 07/03/2014.  FINDINGS: The study is mildly degraded by patient motion. No acute infarct, hemorrhage, or mass lesion is present. Mild generalized atrophy and periventricular white matter changes are noted bilaterally. There are remote  lacunar infarcts involving the right caudate head and anterior limb internal capsule. The ventricles are proportionate to the degree of atrophy. Flow is present in the major intracranial arteries. Midline images demonstrate degenerative changes in the upper cervical spine.  Paranasal sinuses are clear. Minimal fluid is present in the inferior right mastoid air cells. No obstructing nasopharyngeal lesion is evident. There is fluid in the left mastoid air cells and middle ear cavity. No obstructing nasopharyngeal lesion is evident.  IMPRESSION: 1. Mild atrophy and white matter disease likely reflects the sequela of chronic microvascular ischemia. 2. Remote lacunar infarcts of the right basal ganglia. 3. No acute intracranial abnormality. 4. Degenerative changes of the upper cervical spine. 5. Left greater right mastoid effusions. No obstructing nasopharyngeal lesion is evident.   Electronically Signed   By: Gennette Pac M.D.   On: 07/04/2014 19:07   Dg Abd Acute W/chest  07/20/2014   CLINICAL DATA:  Hematemesis.  EXAM: ACUTE ABDOMEN SERIES (ABDOMEN 2 VIEW & CHEST 1 VIEW)  COMPARISON:  July 04, 2014.  FINDINGS: There is no evidence of dilated bowel loops or free intraperitoneal air. No radiopaque calculi or other significant radiographic abnormality is seen. Heart size and mediastinal contours are within normal limits. Both lungs are clear.  IMPRESSION: No evidence of bowel obstruction or ileus. No acute cardiopulmonary disease.   Electronically Signed   By: Roque Lias M.D.   On: 07/20/2014 22:47     PERTINENT LAB RESULTS: CBC:  Recent Labs  07/24/14 0622 07/25/14 0502  WBC 8.4 7.3  HGB 8.4* 8.4*  HCT 26.7* 26.9*  PLT 212 232   CMET CMP     Component Value Date/Time   NA 142 07/25/2014 0502   K 3.5* 07/25/2014 0502   CL 110 07/25/2014 0502   CO2 23 07/25/2014 0502   GLUCOSE 99 07/25/2014 0502   BUN 17 07/25/2014 0502   CREATININE 0.74 07/25/2014 0502   CALCIUM 8.3* 07/25/2014 0502    PROT 5.9* 07/21/2014 0724   ALBUMIN 2.4* 07/21/2014 0724   AST 19 07/21/2014 0724   ALT 17 07/21/2014 0724   ALKPHOS 65 07/21/2014 0724   BILITOT 0.3 07/21/2014 0724   GFRNONAA 90* 07/25/2014 0502   GFRAA >90 07/25/2014 0502    GFR Estimated Creatinine Clearance: 100.3 mL/min (by C-G  formula based on Cr of 0.74). No results for input(s): LIPASE, AMYLASE in the last 72 hours. No results for input(s): CKTOTAL, CKMB, CKMBINDEX, TROPONINI in the last 72 hours. Invalid input(s): POCBNP No results for input(s): DDIMER in the last 72 hours. No results for input(s): HGBA1C in the last 72 hours. No results for input(s): CHOL, HDL, LDLCALC, TRIG, CHOLHDL, LDLDIRECT in the last 72 hours. No results for input(s): TSH, T4TOTAL, T3FREE, THYROIDAB in the last 72 hours.  Invalid input(s): FREET3 No results for input(s): VITAMINB12, FOLATE, FERRITIN, TIBC, IRON, RETICCTPCT in the last 72 hours. Coags: No results for input(s): INR in the last 72 hours.  Invalid input(s): PT Microbiology: Recent Results (from the past 240 hour(s))  Culture, blood (routine x 2)     Status: None (Preliminary result)   Collection Time: 07/21/14  3:05 PM  Result Value Ref Range Status   Specimen Description BLOOD RIGHT ARM  Final   Special Requests BOTTLES DRAWN AEROBIC ONLY 6CC  Final   Culture  Setup Time   Final    07/21/2014 21:40 Performed at [facility]    Culture   Final           BLOOD CULTURE RECEIVED NO GROWTH TO DATE CULTURE WILL BE HELD FOR 5 DAYS BEFORE ISSUING A FINAL NEGATIVE REPORT Performed at [facility]    Report Status PENDING  Incomplete  Culture, blood (routine x 2)     Status: None (Preliminary result)   Collection Time: 07/21/14  3:05 PM  Result Value Ref Range Status   Specimen Description BLOOD RIGHT HAND  Final   Special Requests BOTTLES DRAWN AEROBIC ONLY 3CC  Final   Culture  Setup Time   Final    07/21/2014 21:42 Performed at [facility]     Culture   Final           BLOOD CULTURE RECEIVED NO GROWTH TO DATE CULTURE WILL BE HELD FOR 5 DAYS BEFORE ISSUING A FINAL NEGATIVE REPORT Performed at [facility]    Report Status PENDING  Incomplete     BRIEF HOSPITAL COURSE:  Upper GI bleed: Presented with hematemesis and melena, GI consulted, EGD on 12/18 showed a nonbleeding prepyloric ulcer. Biopsy to see if H. Pylori still pending, continue with PPI on discharge. Per GI to hold all antiplatelet agents for 4 weeks.Hb continues to be stable, tolerating diet.   Acute blood loss anemia: Secondary to above. Transfused 3 units of PRBC so far.GI bleeding seems to have resolved.CBC shows stable Hb at 8.4 over past several days.  Monitor CBC periodically, suggest repeating next week.   Type 2 diabetes:c/w Lantus 10 units, c/w SSI. CBG's stable. Please continue to optimize as outpatient.   HTN: BP currently controlled without use of antihypertensives-will continue tohold atenolol, Imdur on discharge-please resume when able.   Acute renal failure: Likely secondary to GI bleed. Resolved with IV fluids, recheck electrolytes periodically-suggest in 1 week.   Leukocytosis: no fever, no PNA on CXR, UA pending. Resolved, suspect this was stress margination/reactive.Blood culture 12/17 neg so far-but please follow till final   Chronic diastolic heart failure: Clinically compensated.    Hx of CAD-S/P CABG:assymptomatic-anti-platelets currently on hold given GI bleed-per GI to resume in 4 weeks, with high dose PPI   Hx if CVA: non focal exam-anti-platelets currently on hold given GI bleed-per GI to resume in 4 weeks, with high dose PPI   Suspected Dementia:per family he has developed intermittent periods of  confusion over the past few months, they give hx of sundowning. Suspect ? Vascular dementia.Currently very pleasant, and cooperative.Suggest Neurology referral as outpatient.   Hx of PAD-s/p Left BKA   Stage IV Pressure  Ulcer:present on admission.Appreciate wound care eval   TODAY-DAY OF DISCHARGE:  Subjective:   Donia AstJames Lobello today has no headache,no chest abdominal pain,no new weakness tingling or numbness. No further melena over past several days.   Objective:   Blood pressure 136/47, pulse 80, temperature 99.3 F (37.4 C), temperature source Oral, resp. rate 16, height 5\' 10"  (1.778 m), weight 103 kg (227 lb 1.2 oz), SpO2 100 %.  Intake/Output Summary (Last 24 hours) at 07/25/14 1032 Last data filed at 07/25/14 0850  Gross per 24 hour  Intake    270 ml  Output   1675 ml  Net  -1405 ml   Filed Weights   07/21/14 0200 07/23/14 2039 07/24/14 2026  Weight: 101 kg (222 lb 10.6 oz) 102.75 kg (226 lb 8.4 oz) 103 kg (227 lb 1.2 oz)    Exam Awake Alert, Oriented *3, No new F.N deficits, Normal affect Halma.AT,PERRAL Supple Neck,No JVD, No cervical lymphadenopathy appriciated.  Symmetrical Chest wall movement, Good air movement bilaterally, CTAB RRR,No Gallops,Rubs or new Murmurs, No Parasternal Heave +ve B.Sounds, Abd Soft, Non tender, No organomegaly appriciated, No rebound -guarding or rigidity. No Cyanosis, Clubbing or edema, No new Rash or bruise  DISCHARGE CONDITION: Stable  DISPOSITION: SNF  DISCHARGE INSTRUCTIONS:    Activity:  As tolerated with Full fall precautions use walker/cane & assistance as needed  Diet recommendation: Diabetic Diet Heart Healthy diet Fluid restriction 1.5 lit/day  Discharge Instructions    Call MD for:    Complete by:  As directed   Hematochezia, melena     Diet - low sodium heart healthy    Complete by:  As directed      Diet Carb Modified    Complete by:  As directed      Increase activity slowly    Complete by:  As directed            Follow-up Information    Follow up with GATES,ROBERT NEVILL, MD. Schedule an appointment as soon as possible for a visit in 1 week.   Specialty:  Internal Medicine   Contact information:   [address] [place] [address] [phone]       Follow up with Florencia ReasonsBUCCINI,ROBERT V, MD.   Specialty:  Gastroenterology   Why:  As needed-if recurrent GI Bleed   Contact information:   [address] [place] [address] [phone]      Total Time spent on discharge equals 45 minutes.  SignedJeoffrey Massed: GHIMIRE,SHANKER 07/25/2014 10:32 AM

## 2014-07-25 NOTE — Progress Notes (Signed)
[facility] PIV removed. Telemetry #17 removed and returned to nurse's station. [facility] called for transport. [facility] but only received a voicemail inbox - will attempt to call report again before discharge.  Leanna BattlesEckelmann, Patsye Sullivant Eileen, RN.

## 2014-07-25 NOTE — Progress Notes (Signed)
[facility]   Leanna BattlesEckelmann, Iantha Titsworth Eileen, RN.

## 2014-07-27 LAB — CULTURE, BLOOD (ROUTINE X 2)
CULTURE: NO GROWTH
Culture: NO GROWTH

## 2014-08-01 ENCOUNTER — Encounter: Payer: Self-pay | Admitting: Internal Medicine

## 2014-08-01 ENCOUNTER — Non-Acute Institutional Stay (SKILLED_NURSING_FACILITY): Payer: Medicare Other | Admitting: Internal Medicine

## 2014-08-01 DIAGNOSIS — I739 Peripheral vascular disease, unspecified: Secondary | ICD-10-CM

## 2014-08-01 DIAGNOSIS — F0390 Unspecified dementia without behavioral disturbance: Secondary | ICD-10-CM

## 2014-08-01 DIAGNOSIS — Z951 Presence of aortocoronary bypass graft: Secondary | ICD-10-CM | POA: Insufficient documentation

## 2014-08-01 DIAGNOSIS — I5032 Chronic diastolic (congestive) heart failure: Secondary | ICD-10-CM

## 2014-08-01 DIAGNOSIS — E119 Type 2 diabetes mellitus without complications: Secondary | ICD-10-CM

## 2014-08-01 DIAGNOSIS — I25810 Atherosclerosis of coronary artery bypass graft(s) without angina pectoris: Secondary | ICD-10-CM | POA: Insufficient documentation

## 2014-08-01 DIAGNOSIS — I10 Essential (primary) hypertension: Secondary | ICD-10-CM

## 2014-08-01 DIAGNOSIS — Z8673 Personal history of transient ischemic attack (TIA), and cerebral infarction without residual deficits: Secondary | ICD-10-CM

## 2014-08-01 DIAGNOSIS — D62 Acute posthemorrhagic anemia: Secondary | ICD-10-CM | POA: Insufficient documentation

## 2014-08-01 DIAGNOSIS — K253 Acute gastric ulcer without hemorrhage or perforation: Secondary | ICD-10-CM

## 2014-08-01 NOTE — Assessment & Plan Note (Signed)
non focal exam-anti-platelets currently on hold given GI bleed-per GI to resume in 4 weeks, with high dose PPI

## 2014-08-01 NOTE — Assessment & Plan Note (Signed)
:   Likely secondary to GI bleed. Resolved with IV fluids

## 2014-08-01 NOTE — Assessment & Plan Note (Signed)
BP meds were held in hospital but today BP is 190/90 so restarting all-imdur, atenolol, lasix

## 2014-08-01 NOTE — Assessment & Plan Note (Signed)
:  per family he has developed intermittent periods of confusion over the past few months, they give hx of sundowning. Suspect ? Vascular dementia.Currently very pleasant, and cooperative.Suggest Neurology referral as outpatient

## 2014-08-01 NOTE — Assessment & Plan Note (Signed)
S/p L AKA 

## 2014-08-01 NOTE — Assessment & Plan Note (Signed)
c/w Lantus 10 units, c/w SSI. CBG's stable. ;A1c 6.1 07/2014

## 2014-08-01 NOTE — Assessment & Plan Note (Signed)
Presented with hematemesis and melena, GI consulted, EGD on 12/18 showed a nonbleeding prepyloric ulcer. Biopsy to see if H. Pylori still pending, continue with PPI on discharge. Per GI to hold all antiplatelet agents for 4 weeks.Hb continues to be stable, tolerating diet

## 2014-08-01 NOTE — Progress Notes (Signed)
MRN: [id number] Name: Nicholas [name]  Sex: male Age: 73 y.o. DOB: 05-20-1941  [email or web address] Facility/Room:116 Level Of Care: SNF Provider: Merrilee Seashore D Emergency Contacts: Extended Emergency Contact Information Primary Emergency Contact: Dene, Nazir Address: [address]          [place], [place] [address] [place] of [place] Home Phone: [phone] Mobile Phone: [phone] Relation: Spouse  Code Status: DNR  Allergies: Contrast media  Chief Complaint  Patient presents with  . New Admit To SNF    HPI: Patient is 73 y.o. male who is admitted tpo SNF for OT/PT s/p upper GI bleed.  Past Medical History  Diagnosis Date  . Hypertension   . Peripheral vascular disease   . Ulcer     left foot  . Dyslipidemia   . Chronic venous insufficiency   . Sleep apnea   . Morbid obesity   . Diverticulosis   . Benign prostatic hypertrophy   . Glaucoma   . Gout   . Depression   . Insomnia   . CAD (coronary artery disease), native coronary artery   . GERD (gastroesophageal reflux disease)   . Angina   . Pneumonia 12/11/11  . Type II diabetes mellitus   . Chronic daily headache     "~ all the time"  . Seizures   . Stroke ~ 1983    "little weaker right hand"  . MRSA (methicillin resistant staph aureus) culture positive Dec. 17, 2013     right heel infection    Past Surgical History  Procedure Laterality Date  . Appendectomy    . Lumbar laminectomy    . Mastoid debridement    . Knee and ankle surgery  1979    "caved in in a ditch; messed up legs"; right  . Cholecystectomy    . Back surgery      "5 or 6"  . Peripheral arterial stent graft      "don't remember which side"  . Coronary artery bypass graft  1983; 1992    "CABG X 5; CABG X 8"  . Lower extremity angiogram  01/21/2012    Procedure: LOWER EXTREMITY ANGIOGRAM;  Surgeon: Nada Libman, MD;  Location: [facility] OR;  Service: Vascular;  Laterality: Bilateral;  WITH POSSIBLE INTERVENTION  . Eye surgery       "laser; both eyes"  . Eye surgery  05/08/2012    Cataract Left eye  . Heel bx  Dec. 17, 2013    Right Heel Bx.    . Transluminal atherectomy popliteal artery  01/21/2012    Left  . Amputation Left 08/27/2013    Procedure: LEFT ABOVE THE KNEE AMPUTATION ;  Surgeon: Pryor Ochoa, MD;  Location: [facility] OR;  Service: Vascular;  Laterality: Left;  . Abdominal aortagram N/A 06/02/2012    Procedure: ABDOMINAL AORTAGRAM;  Surgeon: Nada Libman, MD;  Location: [facility] CATH LAB;  Service: Cardiovascular;  Laterality: N/A;  . Esophagogastroduodenoscopy (egd) with propofol N/A 07/22/2014    Procedure: ESOPHAGOGASTRODUODENOSCOPY (EGD) WITH PROPOFOL;  Surgeon: Florencia Reasons, MD;  Location: [facility] ENDOSCOPY;  Service: Endoscopy;  Laterality: N/A;      Medication List       This list is accurate as of: 08/01/14 10:01 PM.  Always use your most recent med list.               allopurinol 300 MG tablet  Commonly known as:  ZYLOPRIM  Take 1 tablet (300 mg total) by mouth every morning.  alum & mag hydroxide-simeth 200-200-20 MG/5ML suspension  Commonly known as:  MAALOX/MYLANTA  Take 30 mLs by mouth as needed for indigestion or heartburn.     bisacodyl 10 MG suppository  Commonly known as:  DULCOLAX  Place 10 mg rectally as needed for moderate constipation (constipation not relieved by Milk of Magnesium).     brimonidine-timolol 0.2-0.5 % ophthalmic solution  Commonly known as:  COMBIGAN  Place 1 drop into both eyes every 12 (twelve) hours.     diclofenac sodium 1 % Gel  Commonly known as:  VOLTAREN  Apply 1 application topically 4 (four) times daily as needed. Applies to both knees as needed for pain     feeding supplement (GLUCERNA SHAKE) Liqd  Take 237 mLs by mouth 2 (two) times daily between meals.     feeding supplement (PRO-STAT SUGAR FREE 64) Liqd  Take 30 mLs by mouth 2 (two) times daily with a meal.     HYDROcodone-acetaminophen 10-325 MG per tablet  Commonly known as:  NORCO   Take one tablet by mouth three times daily for pain     insulin aspart 100 UNIT/ML injection  Commonly known as:  novoLOG  - insulin aspart (novoLOG) injection 0-9 Units  - Three times daily with meals  - Correction coverage: Sensitive (thin, NPO, renal)  - CBG < 70: implement hypoglycemia protocol  - CBG 70 - 120: 0 units  - CBG 121 - 150: 1 unit  - CBG 151 - 200: 2 units  - CBG 201 - 250: 3 units  - CBG 251 - 300: 5 units  - CBG 301 - 350: 7 units  - CBG 351 - 400: 9 units  - CBG > 400: call MD     insulin glargine 100 UNIT/ML injection  Commonly known as:  LANTUS  Inject 0.1 mLs (10 Units total) into the skin daily.     latanoprost 0.005 % ophthalmic solution  Commonly known as:  XALATAN  Place 1 drop into both eyes at bedtime.     magnesium hydroxide 400 MG/5ML suspension  Commonly known as:  MILK OF MAGNESIA  Take 30 mLs by mouth daily as needed for mild constipation.     mirtazapine 15 MG tablet  Commonly known as:  REMERON  Take 1 tablet (15 mg total) by mouth at bedtime.     multivitamin with minerals Tabs tablet  Take 1 tablet by mouth daily.     nitroGLYCERIN 0.4 MG SL tablet  Commonly known as:  NITROSTAT  Place 1 tablet (0.4 mg total) under the tongue every 5 (five) minutes as needed. For chest pain     ondansetron 4 MG tablet  Commonly known as:  ZOFRAN  Take 1 tablet (4 mg total) by mouth every 6 (six) hours as needed for nausea.     pantoprazole 40 MG tablet  Commonly known as:  PROTONIX  Take 1 tablet (40 mg total) by mouth 2 (two) times daily.     risperiDONE 0.5 MG tablet  Commonly known as:  RISPERDAL  Take 2 tablets (1 mg total) by mouth at bedtime.     simvastatin 20 MG tablet  Commonly known as:  ZOCOR  Take 1 tablet (20 mg total) by mouth every evening.     sodium phosphate 7-19 GM/118ML Enem  Place 1 enema rectally once as needed for severe constipation (for constipation not relieved by milk of magnesium).     tamsulosin  0.4 MG Caps capsule  Commonly known  as:  FLOMAX  Take 2 capsules (0.8 mg total) by mouth at bedtime.        No orders of the defined types were placed in this encounter.    Immunization History  Administered Date(s) Administered  . Pneumococcal-Unspecified 04/06/2011    History  Substance Use Topics  . Smoking status: Former Smoker -- 1.00 packs/day for 20 years    Types: Cigarettes    Quit date: 07/29/1991  . Smokeless tobacco: Former Neurosurgeon    Types: Chew    Quit date: 08/06/1987  . Alcohol Use: No     Comment: occasionallly    Family history is noncontributory    Review of Systems  DATA OBTAINED: from patient, nurse GENERAL:  no fevers, fatigue, appetite changes SKIN: No itching, rash or wounds EYES: No eye pain, redness, discharge EARS: No earache, tinnitus, change in hearing NOSE: No congestion, drainage or bleeding  MOUTH/THROAT: No mouth or tooth pain, No sore throat RESPIRATORY: No cough, wheezing, SOB CARDIAC: No chest pain, palpitations, lower extremity edema  GI: No abdominal pain, No N/V/D or constipation, No heartburn or reflux  GU: No dysuria, frequency or urgency, or incontinence  MUSCULOSKELETAL: No unrelieved bone/joint pain NEUROLOGIC: No headache, dizziness or focal weakness PSYCHIATRIC: No overt anxiety or sadness, No behavior issue.   Filed Vitals:   08/01/14 2139  BP: 194/90  Pulse: 98  Temp: 98.2 F (36.8 C)  Resp: 18    Physical Exam  GENERAL APPEARANCE: Alert, modconversant,  No acute distress.  SKIN: No diaphoresis rash HEAD: Normocephalic, atraumatic  EYES: Conjunctiva/lids clear. Pupils round, reactive. EOMs intact.  EARS: External exam WNL, canals clear. Hearing grossly normal.  NOSE: No deformity or discharge.  MOUTH/THROAT: Lips w/o lesions  RESPIRATORY: Breathing is even, unlabored. Lung sounds are clear   CARDIOVASCULAR: Heart RRR no murmurs, rubs or gallops. No peripheral edema.   GASTROINTESTINAL: Abdomen is soft,  non-tender, not distended w/ normal bowel sounds. GENITOURINARY: Bladder non tender, not distended  MUSCULOSKELETAL: L AKA NEUROLOGIC:  Cranial nerves 2-12 grossly intact. Moves all extremities  PSYCHIATRIC: c/w mild dementia, no behavioral issues  Patient Active Problem List   Diagnosis Date Noted  . Acute blood loss anemia 08/01/2014  . Gastric ulcer, acute 08/01/2014  . CAD (coronary artery disease) of artery bypass graft 08/01/2014  . S/P CABG (coronary artery bypass graft) 08/01/2014  . H/O: CVA (cerebrovascular accident) 08/01/2014  . Dementia without behavioral disturbance 08/01/2014  . GI bleed 07/21/2014  . Diabetes mellitus type 2, controlled 07/21/2014  . Hematemesis 07/20/2014  . ARF (acute renal failure) 07/19/2014  . Altered mental status   . UTI (lower urinary tract infection) 07/04/2014  . Encephalopathy acute 07/04/2014  . Hallucination   . Degenerative arthritis of right knee 03/21/2014  . PVD (peripheral vascular disease) 09/28/2013  . S/P AKA (above knee amputation) 08/30/2013  . Sacral decubitus ulcer, stage IV 08/27/2013  . MRSA (methicillin resistant Staphylococcus aureus) infection 08/26/2013  . History of stroke   . Cellulitis of foot 08/24/2013  . Type 2 diabetes mellitus with diabetic nephropathy 06/03/2013  . Pressure ulcer stage II 06/03/2013  . Chronic respiratory failure 05/31/2013  . Chronic diastolic heart failure   . 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 DNR (do not resuscitate) 12/11/2011  . Morbid obesity   . CAD (coronary artery disease), native coronary artery   . Diabetic foot ulcer 08/28/2011  . PAD (peripheral artery disease) 07/29/2011  . Anxiety 07/29/2011  .  BPH (benign prostatic hyperplasia) 07/29/2011  . Type 2 diabetes mellitus with vascular disease   . Hypertension   . Hyperlipidemia     CBC    Component Value Date/Time   WBC 7.3 07/25/2014 0502   RBC 3.14*  07/25/2014 0502   HGB 8.4* 07/25/2014 0502   HCT 26.9* 07/25/2014 0502   PLT 232 07/25/2014 0502   MCV 85.7 07/25/2014 0502   LYMPHSABS 3.4 07/03/2014 1021   MONOABS 0.4 07/03/2014 1021   EOSABS 0.4 07/03/2014 1021   BASOSABS 0.0 07/03/2014 1021    CMP     Component Value Date/Time   NA 142 07/25/2014 0502   K 3.5* 07/25/2014 0502   CL 110 07/25/2014 0502   CO2 23 07/25/2014 0502   GLUCOSE 99 07/25/2014 0502   BUN 17 07/25/2014 0502   CREATININE 0.74 07/25/2014 0502   CALCIUM 8.3* 07/25/2014 0502   PROT 5.9* 07/21/2014 0724   ALBUMIN 2.4* 07/21/2014 0724   AST 19 07/21/2014 0724   ALT 17 07/21/2014 0724   ALKPHOS 65 07/21/2014 0724   BILITOT 0.3 07/21/2014 0724   GFRNONAA 90* 07/25/2014 0502   GFRAA >90 07/25/2014 0502    Assessment and Plan  GI bleed UPPER: Presented with hematemesis and melena, GI consulted, EGD on 12/18 showed a nonbleeding prepyloric ulcer. Biopsy to see if H. Pylori still pending, continue with PPI on discharge. Per GI to hold all antiplatelet agents for 4 weeks.Hb continues to be stable, tolerating diet  Acute blood loss anemia Secondary to above. Transfused 3 units of PRBC so far.GI bleeding seems to have resolved.CBC shows stable Hb at 8.4 over past several days. Monitor CBC periodically, suggest repeating next week.   Gastric ulcer, acute Presented with hematemesis and melena, GI consulted, EGD on 12/18 showed a nonbleeding prepyloric ulcer. Biopsy to see if H. Pylori still pending, continue with PPI on discharge. Per GI to hold all antiplatelet agents for 4 weeks.Hb continues to be stable, tolerating diet   Diabetes mellitus type 2, controlled c/w Lantus 10 units, c/w SSI. CBG's stable. ;A1c 6.1 07/2014  ARF (acute renal failure) : Likely secondary to GI bleed. Resolved with IV fluids  Hypertension BP meds were held in hospital but today BP is 190/90 so restarting all-imdur, atenolol, lasix  Chronic diastolic heart failure Clinically  compensated  S/P CABG (coronary artery bypass graft) assymptomatic-anti-platelets currently on hold given GI bleed-per GI to resume in 4 weeks, with high dose PPI   H/O: CVA (cerebrovascular accident) non focal exam-anti-platelets currently on hold given GI bleed-per GI to resume in 4 weeks, with high dose PPI   Dementia without behavioral disturbance :per family he has developed intermittent periods of confusion over the past few months, they give hx of sundowning. Suspect ? Vascular dementia.Currently very pleasant, and cooperative.Suggest Neurology referral as outpatient  PVD (peripheral vascular disease) S/p L AKA    Margit HanksALEXANDER, ANNE D, MD

## 2014-08-01 NOTE — Assessment & Plan Note (Signed)
assymptomatic-anti-platelets currently on hold given GI bleed-per GI to resume in 4 weeks, with high dose PPI

## 2014-08-01 NOTE — Assessment & Plan Note (Signed)
Secondary to above. Transfused 3 units of PRBC so far.GI bleeding seems to have resolved.CBC shows stable Hb at 8.4 over past several days. Monitor CBC periodically, suggest repeating next week.

## 2014-08-01 NOTE — Assessment & Plan Note (Signed)
UPPER: Presented with hematemesis and melena, GI consulted, EGD on 12/18 showed a nonbleeding prepyloric ulcer. Biopsy to see if H. Pylori still pending, continue with PPI on discharge. Per GI to hold all antiplatelet agents for 4 weeks.Hb continues to be stable, tolerating diet

## 2014-08-01 NOTE — Assessment & Plan Note (Signed)
Clinically compensated.

## 2014-09-05 DEATH — deceased

## 2015-09-13 IMAGING — CR DG ABDOMEN ACUTE W/ 1V CHEST
4 series · 4 of 4 positions shown · non-contrast
Comparison: July 04, 2014.

CLINICAL DATA: Hematemesis.

EXAM:
ACUTE ABDOMEN SERIES (ABDOMEN 2 VIEW & CHEST 1 VIEW)

[abdomen supine (1 of 2)]
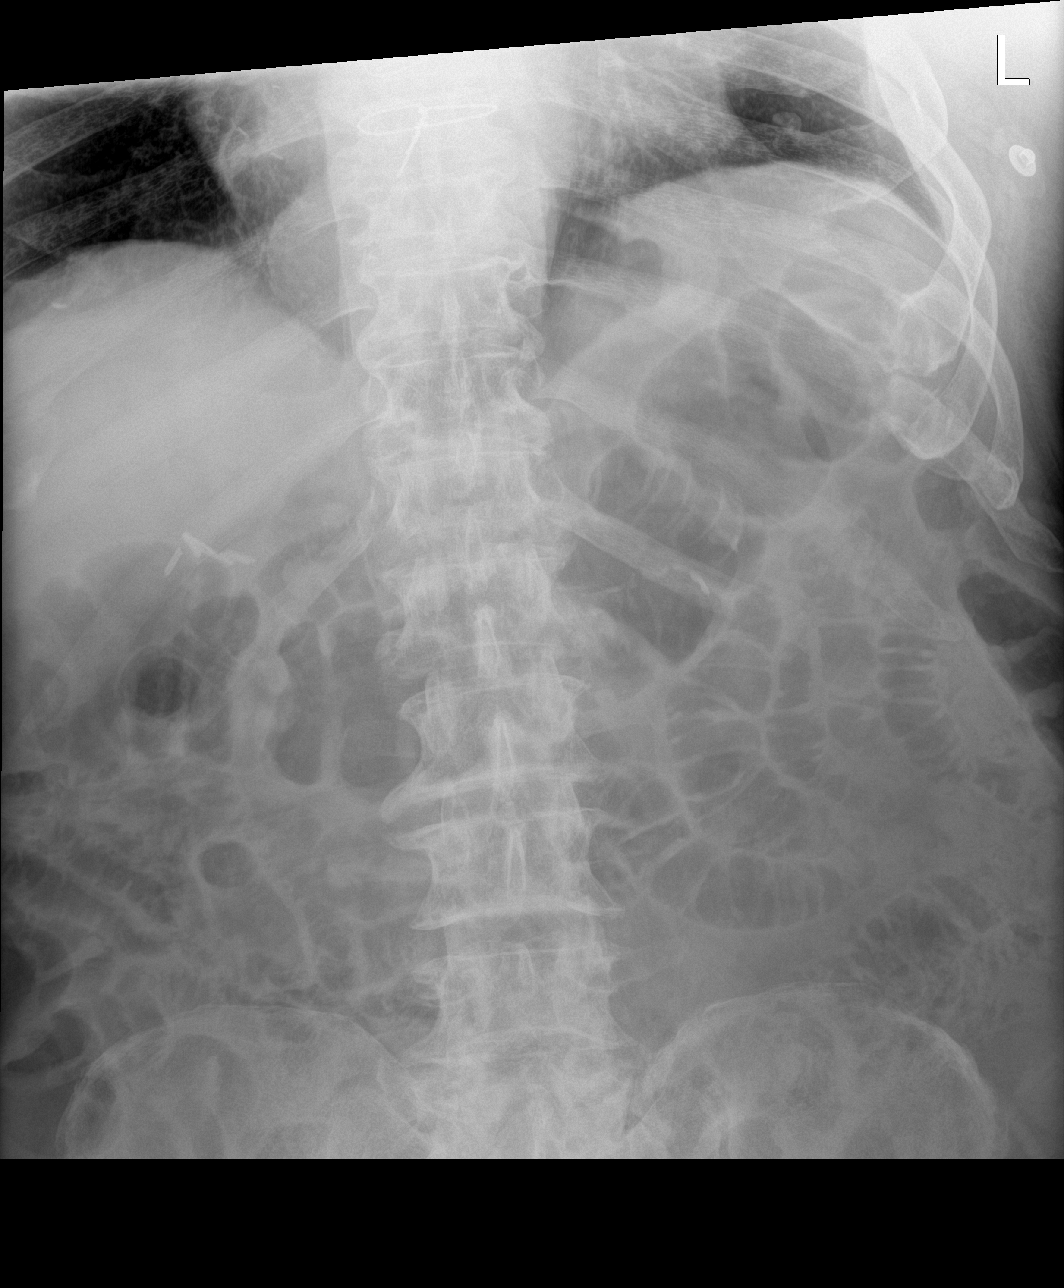

[chest ap]
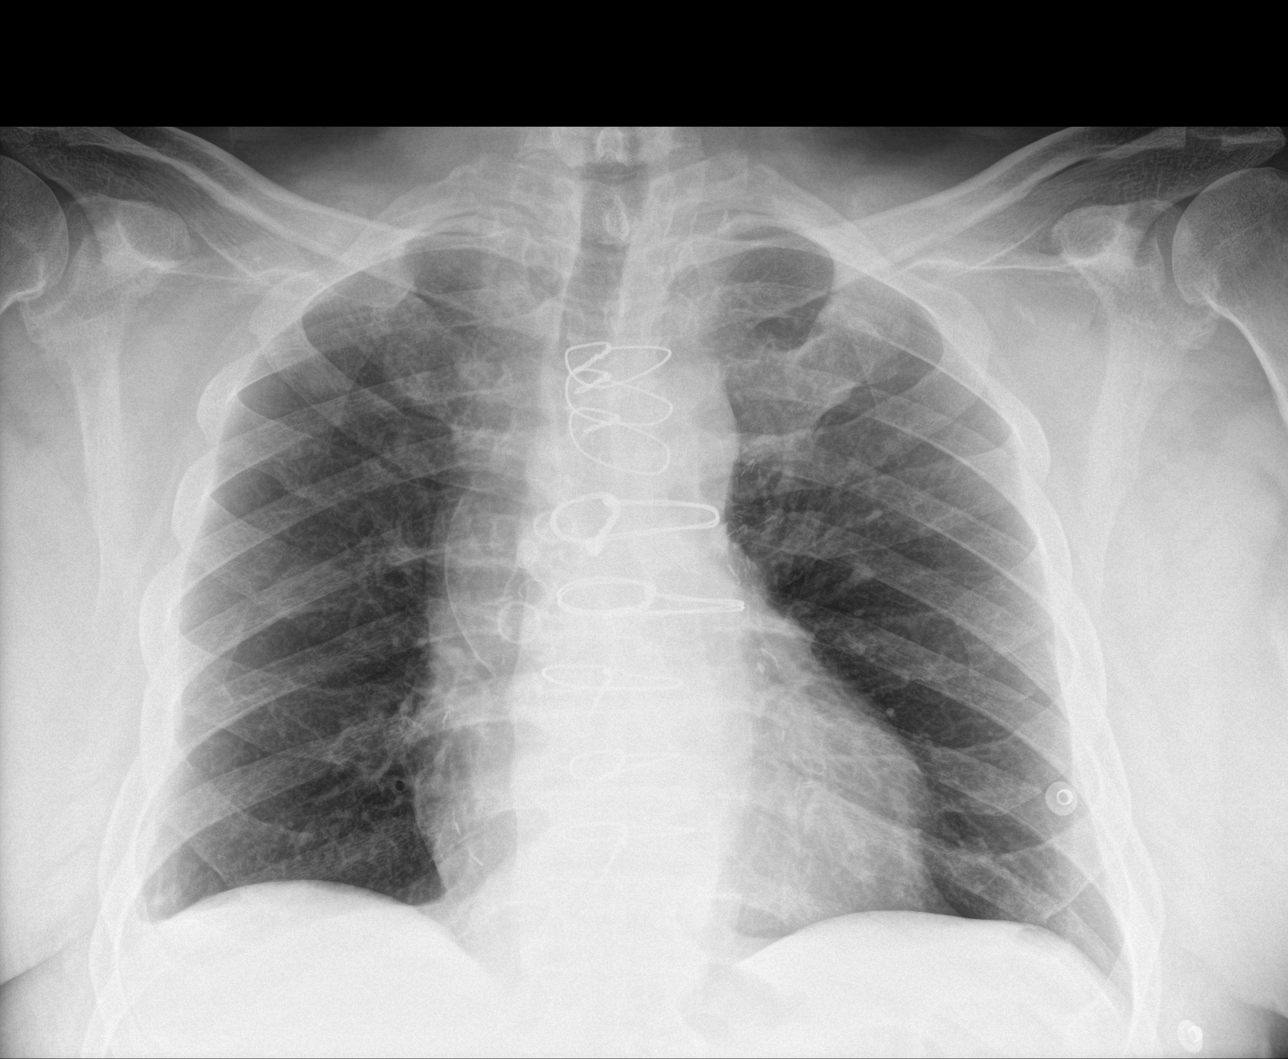

[abdomen decu]
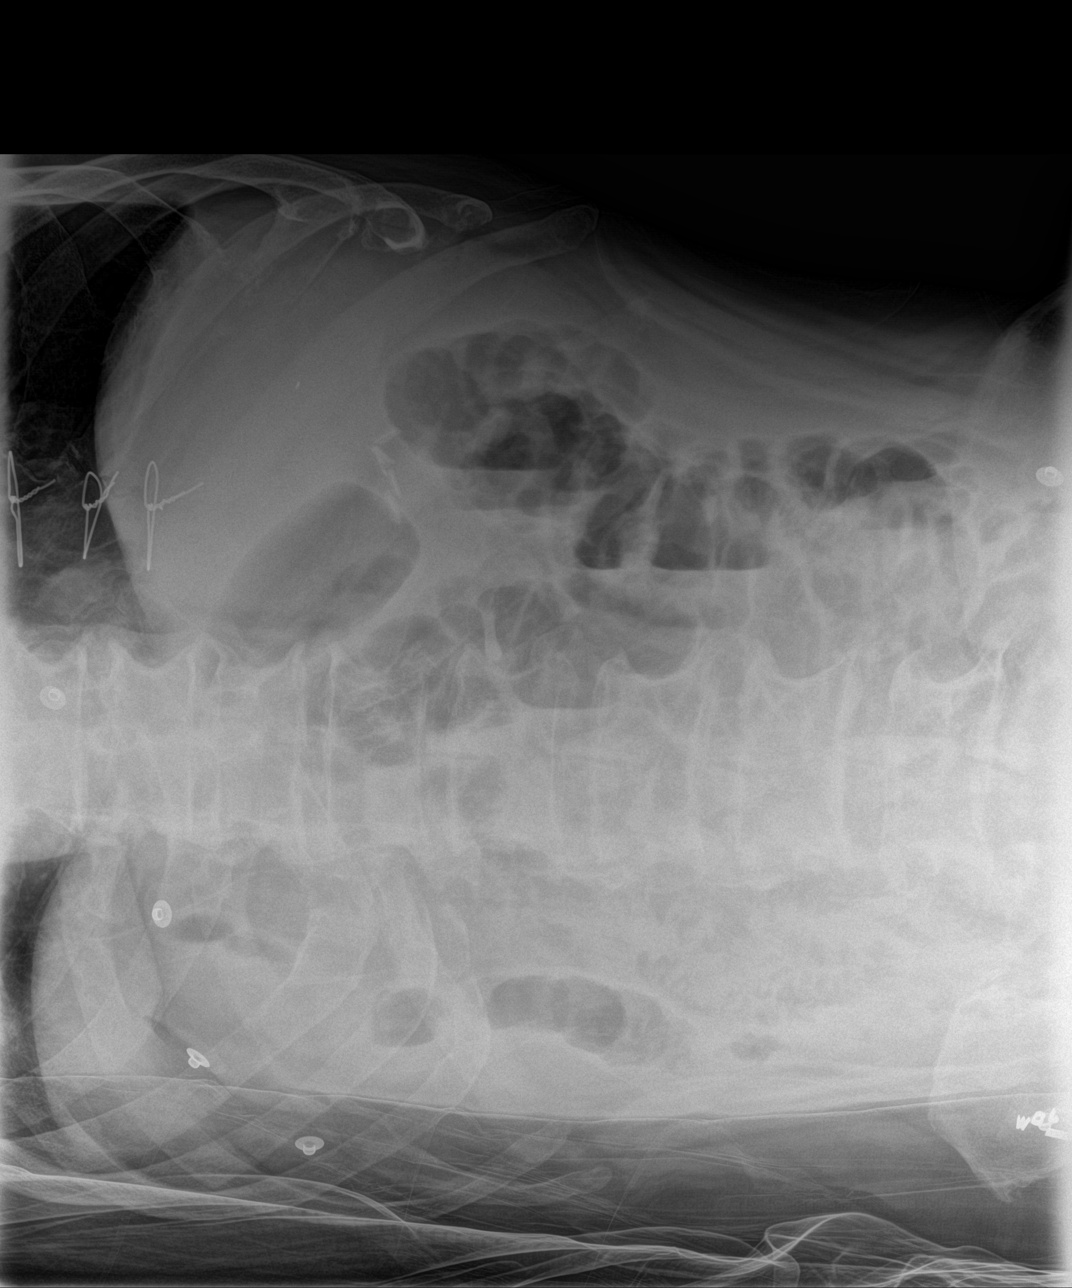

[abdomen supine (2 of 2)]
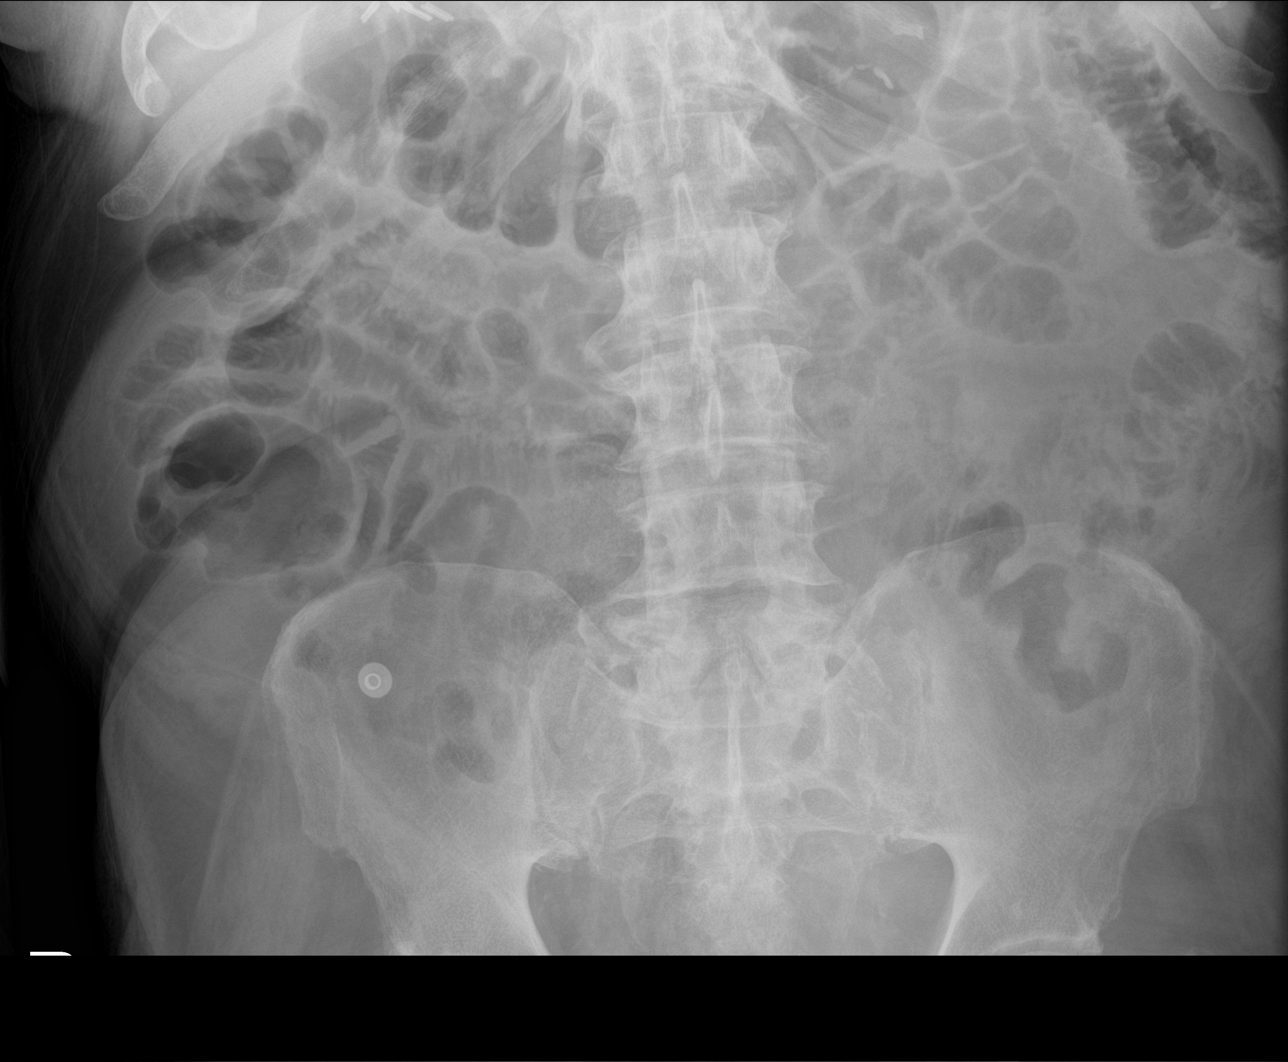

[4 of 4 positions shown; findings below may reference images not displayed]

FINDINGS: There is no evidence of dilated bowel loops or free intraperitoneal
air. No radiopaque calculi or other significant radiographic
abnormality is seen. Heart size and mediastinal contours are within
normal limits. Both lungs are clear.
IMPRESSION: No evidence of bowel obstruction or ileus. No acute cardiopulmonary
disease.
